# Patient Record
Sex: Female | Born: 1935 | Race: Black or African American | Hispanic: No | Marital: Single | Smoking: Never smoker
Health system: Southern US, Community
[De-identification: ages and names within clinical notes are randomized; demographics above are authoritative.]

## PROBLEM LIST (undated history)

## (undated) DIAGNOSIS — F0390 Unspecified dementia without behavioral disturbance: Secondary | ICD-10-CM

## (undated) DIAGNOSIS — Z933 Colostomy status: Secondary | ICD-10-CM

## (undated) DIAGNOSIS — I10 Essential (primary) hypertension: Secondary | ICD-10-CM

## (undated) DIAGNOSIS — J45909 Unspecified asthma, uncomplicated: Secondary | ICD-10-CM

## (undated) DIAGNOSIS — E119 Type 2 diabetes mellitus without complications: Secondary | ICD-10-CM

## (undated) DIAGNOSIS — J189 Pneumonia, unspecified organism: Secondary | ICD-10-CM

## (undated) DIAGNOSIS — N183 Chronic kidney disease, stage 3 (moderate): Secondary | ICD-10-CM

## (undated) DIAGNOSIS — R001 Bradycardia, unspecified: Secondary | ICD-10-CM

## (undated) DIAGNOSIS — E1140 Type 2 diabetes mellitus with diabetic neuropathy, unspecified: Secondary | ICD-10-CM

## (undated) HISTORY — PX: COLOSTOMY: SHX63

## (undated) HISTORY — DX: Type 2 diabetes mellitus with diabetic neuropathy, unspecified: E11.40

## (undated) HISTORY — DX: Bradycardia, unspecified: R00.1

## (undated) HISTORY — PX: ABDOMINAL HYSTERECTOMY: SHX81

---

## 1997-08-31 ENCOUNTER — Ambulatory Visit (HOSPITAL_COMMUNITY): Admission: RE | Admit: 1997-08-31 | Discharge: 1997-09-01 | Payer: Self-pay | Admitting: Ophthalmology

## 1997-09-10 ENCOUNTER — Inpatient Hospital Stay (HOSPITAL_COMMUNITY): Admission: EM | Admit: 1997-09-10 | Discharge: 1997-09-23 | Payer: Self-pay | Admitting: Emergency Medicine

## 2000-01-14 ENCOUNTER — Encounter: Admission: RE | Admit: 2000-01-14 | Discharge: 2000-01-14 | Payer: Self-pay | Admitting: Emergency Medicine

## 2000-01-14 ENCOUNTER — Encounter: Payer: Self-pay | Admitting: Emergency Medicine

## 2001-03-15 ENCOUNTER — Encounter: Admission: RE | Admit: 2001-03-15 | Discharge: 2001-03-15 | Payer: Self-pay | Admitting: Emergency Medicine

## 2001-03-15 ENCOUNTER — Encounter: Payer: Self-pay | Admitting: Emergency Medicine

## 2002-02-27 ENCOUNTER — Encounter: Admission: RE | Admit: 2002-02-27 | Discharge: 2002-02-27 | Payer: Self-pay | Admitting: Otolaryngology

## 2002-02-27 ENCOUNTER — Encounter: Payer: Self-pay | Admitting: Otolaryngology

## 2002-03-14 ENCOUNTER — Encounter: Admission: RE | Admit: 2002-03-14 | Discharge: 2002-03-14 | Payer: Self-pay | Admitting: Emergency Medicine

## 2002-03-14 ENCOUNTER — Encounter: Payer: Self-pay | Admitting: Emergency Medicine

## 2002-04-12 ENCOUNTER — Encounter: Payer: Self-pay | Admitting: Otolaryngology

## 2002-04-12 ENCOUNTER — Encounter: Admission: RE | Admit: 2002-04-12 | Discharge: 2002-04-12 | Payer: Self-pay | Admitting: Otolaryngology

## 2002-04-26 ENCOUNTER — Encounter: Payer: Self-pay | Admitting: Otolaryngology

## 2002-04-26 ENCOUNTER — Encounter: Admission: RE | Admit: 2002-04-26 | Discharge: 2002-04-26 | Payer: Self-pay | Admitting: Otolaryngology

## 2002-04-28 ENCOUNTER — Encounter (INDEPENDENT_AMBULATORY_CARE_PROVIDER_SITE_OTHER): Payer: Self-pay | Admitting: *Deleted

## 2002-04-28 ENCOUNTER — Ambulatory Visit (HOSPITAL_BASED_OUTPATIENT_CLINIC_OR_DEPARTMENT_OTHER): Admission: RE | Admit: 2002-04-28 | Discharge: 2002-04-29 | Payer: Self-pay | Admitting: Otolaryngology

## 2002-04-28 HISTORY — PX: NASAL SINUS SURGERY: SHX719

## 2004-06-05 ENCOUNTER — Emergency Department (HOSPITAL_COMMUNITY): Admission: EM | Admit: 2004-06-05 | Discharge: 2004-06-05 | Payer: Self-pay | Admitting: Family Medicine

## 2004-06-05 ENCOUNTER — Ambulatory Visit (HOSPITAL_COMMUNITY): Admission: RE | Admit: 2004-06-05 | Discharge: 2004-06-05 | Payer: Self-pay | Admitting: Family Medicine

## 2004-06-24 ENCOUNTER — Ambulatory Visit: Payer: Self-pay | Admitting: Internal Medicine

## 2004-10-24 ENCOUNTER — Ambulatory Visit: Payer: Self-pay | Admitting: Internal Medicine

## 2005-03-04 ENCOUNTER — Ambulatory Visit: Payer: Self-pay | Admitting: Internal Medicine

## 2005-03-14 ENCOUNTER — Inpatient Hospital Stay (HOSPITAL_COMMUNITY): Admission: EM | Admit: 2005-03-14 | Discharge: 2005-03-20 | Payer: Self-pay | Admitting: Family Medicine

## 2005-07-29 ENCOUNTER — Encounter: Admission: RE | Admit: 2005-07-29 | Discharge: 2005-07-29 | Payer: Self-pay | Admitting: Emergency Medicine

## 2006-06-03 ENCOUNTER — Ambulatory Visit: Payer: Self-pay | Admitting: Internal Medicine

## 2006-11-07 ENCOUNTER — Emergency Department (HOSPITAL_COMMUNITY): Admission: EM | Admit: 2006-11-07 | Discharge: 2006-11-07 | Payer: Self-pay | Admitting: Emergency Medicine

## 2006-11-09 ENCOUNTER — Encounter: Admission: RE | Admit: 2006-11-09 | Discharge: 2006-11-09 | Payer: Self-pay | Admitting: Emergency Medicine

## 2006-11-15 HISTORY — PX: LUMBAR FUSION: SHX111

## 2006-11-16 ENCOUNTER — Encounter: Admission: RE | Admit: 2006-11-16 | Discharge: 2006-11-16 | Payer: Self-pay | Admitting: Emergency Medicine

## 2006-11-30 ENCOUNTER — Inpatient Hospital Stay (HOSPITAL_COMMUNITY): Admission: EM | Admit: 2006-11-30 | Discharge: 2006-12-14 | Payer: Self-pay | Admitting: Emergency Medicine

## 2006-12-03 ENCOUNTER — Ambulatory Visit: Payer: Self-pay | Admitting: Infectious Diseases

## 2006-12-08 ENCOUNTER — Encounter (INDEPENDENT_AMBULATORY_CARE_PROVIDER_SITE_OTHER): Payer: Self-pay | Admitting: Neurosurgery

## 2006-12-08 ENCOUNTER — Ambulatory Visit: Payer: Self-pay | Admitting: *Deleted

## 2006-12-10 ENCOUNTER — Encounter: Payer: Self-pay | Admitting: Neurosurgery

## 2006-12-10 ENCOUNTER — Ambulatory Visit: Payer: Self-pay | Admitting: Physical Medicine & Rehabilitation

## 2006-12-14 ENCOUNTER — Inpatient Hospital Stay (HOSPITAL_COMMUNITY)
Admission: RE | Admit: 2006-12-14 | Discharge: 2006-12-29 | Payer: Self-pay | Admitting: Physical Medicine & Rehabilitation

## 2006-12-14 ENCOUNTER — Ambulatory Visit: Payer: Self-pay | Admitting: Physical Medicine & Rehabilitation

## 2007-01-07 ENCOUNTER — Encounter: Payer: Self-pay | Admitting: Internal Medicine

## 2007-01-21 ENCOUNTER — Encounter (INDEPENDENT_AMBULATORY_CARE_PROVIDER_SITE_OTHER): Payer: Self-pay | Admitting: Internal Medicine

## 2007-01-21 ENCOUNTER — Ambulatory Visit: Payer: Self-pay | Admitting: Vascular Surgery

## 2007-01-21 ENCOUNTER — Inpatient Hospital Stay (HOSPITAL_COMMUNITY): Admission: EM | Admit: 2007-01-21 | Discharge: 2007-01-27 | Payer: Self-pay | Admitting: Emergency Medicine

## 2007-02-28 ENCOUNTER — Ambulatory Visit: Payer: Self-pay | Admitting: Infectious Disease

## 2007-02-28 DIAGNOSIS — M869 Osteomyelitis, unspecified: Secondary | ICD-10-CM | POA: Insufficient documentation

## 2007-02-28 DIAGNOSIS — R6883 Chills (without fever): Secondary | ICD-10-CM

## 2007-02-28 DIAGNOSIS — M519 Unspecified thoracic, thoracolumbar and lumbosacral intervertebral disc disorder: Secondary | ICD-10-CM

## 2007-02-28 DIAGNOSIS — D508 Other iron deficiency anemias: Secondary | ICD-10-CM | POA: Insufficient documentation

## 2007-02-28 LAB — CONVERTED CEMR LAB
BUN: 25 mg/dL — ABNORMAL HIGH (ref 6–23)
Basophils Absolute: 0.1 10*3/uL (ref 0.0–0.1)
CO2: 29 meq/L (ref 19–32)
Calcium: 9.9 mg/dL (ref 8.4–10.5)
Chloride: 104 meq/L (ref 96–112)
Creatinine, Ser: 1.23 mg/dL — ABNORMAL HIGH (ref 0.40–1.20)
Eosinophils Absolute: 0.1 10*3/uL — ABNORMAL LOW (ref 0.2–0.7)
Eosinophils Relative: 1 % (ref 0–5)
HCT: 32.4 % — ABNORMAL LOW (ref 36.0–46.0)
Hemoglobin: 10.6 g/dL — ABNORMAL LOW (ref 12.0–15.0)
Lymphocytes Relative: 34 % (ref 12–46)
Lymphs Abs: 2.3 10*3/uL (ref 0.7–4.0)
MCV: 86.4 fL (ref 78.0–100.0)
Monocytes Absolute: 0.5 10*3/uL (ref 0.1–1.0)
RDW: 18.5 % — ABNORMAL HIGH (ref 11.5–15.5)
TSH: 2.058 microintl units/mL (ref 0.350–5.50)

## 2007-12-09 ENCOUNTER — Encounter: Admission: RE | Admit: 2007-12-09 | Discharge: 2007-12-09 | Payer: Self-pay | Admitting: Emergency Medicine

## 2007-12-14 ENCOUNTER — Encounter: Admission: RE | Admit: 2007-12-14 | Discharge: 2007-12-14 | Payer: Self-pay | Admitting: Emergency Medicine

## 2008-03-27 IMAGING — CR DG CHEST 1V PORT
1 series · 1 of 1 positions shown · non-contrast
Comparison: 12/20/06.

CLINICAL DATA: The patient is recovering after lumbar surgery and requires PICC line.  A left upper extremity PICC line has been placed at the bedside and is new from a previous catheter placed on 12/20/06. 
 PORTABLE CHEST ([DATE] HOURS):

[view not recorded]
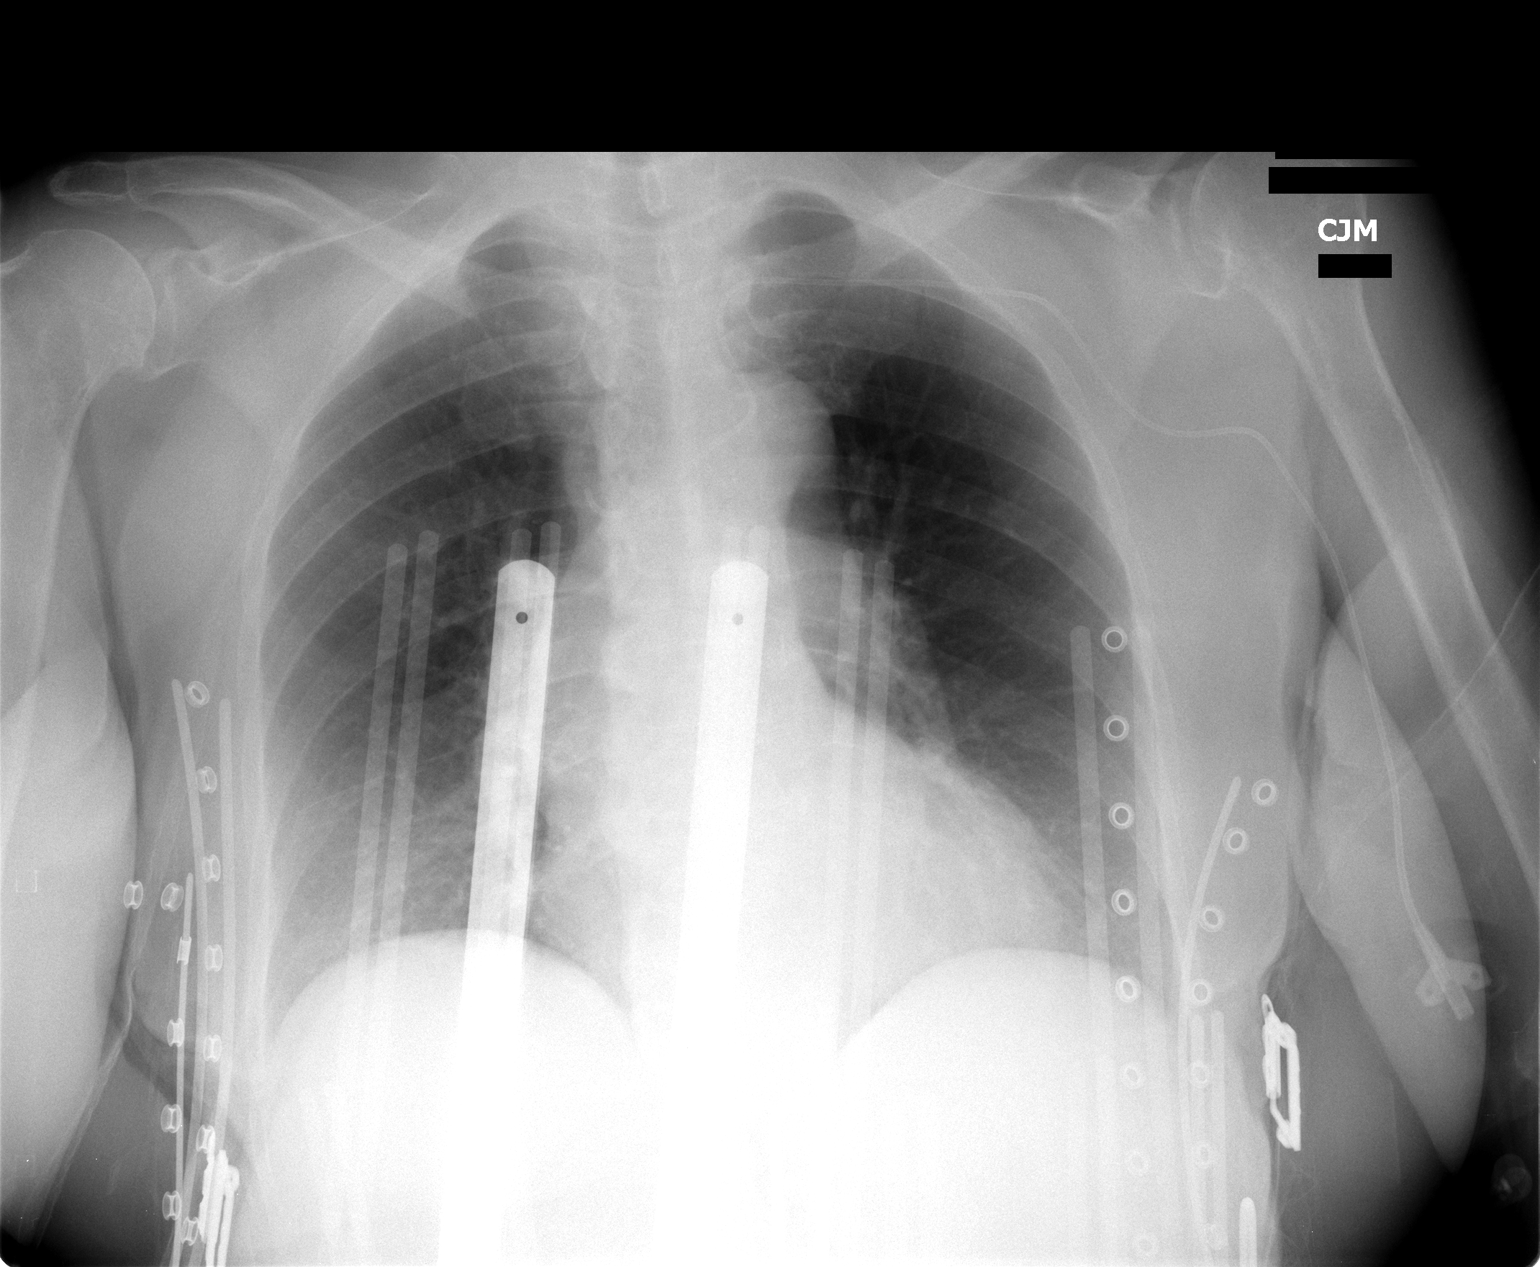

[1 of 1 positions shown; findings below may reference images not displayed]

FINDINGS: Left upper extremity PICC line is visualized.  The catheter tip overlies the proximal SVC and is in a curved orientation with the tip projecting somewhat superiorly relative to where it enters the SVC.  In this configuration, this does suggest that the tip lies within or at the orifice of the azygos vein.  Lungs are otherwise clear.
IMPRESSION: Newly placed PICC line tip lies in the upper SVC.  Based on configuration it is suspected that the tip lies in or at the orifice of the azygos vein.

## 2008-10-07 ENCOUNTER — Emergency Department (HOSPITAL_COMMUNITY): Admission: EM | Admit: 2008-10-07 | Discharge: 2008-10-07 | Payer: Self-pay | Admitting: Family Medicine

## 2010-04-08 ENCOUNTER — Encounter
Admission: RE | Admit: 2010-04-08 | Discharge: 2010-04-08 | Payer: Self-pay | Source: Home / Self Care | Attending: Internal Medicine | Admitting: Internal Medicine

## 2010-06-22 LAB — DIFFERENTIAL
Basophils Relative: 0 % (ref 0–1)
Lymphocytes Relative: 26 % (ref 12–46)
Lymphs Abs: 1.8 10*3/uL (ref 0.7–4.0)
Monocytes Relative: 1 % — ABNORMAL LOW (ref 3–12)
Neutro Abs: 5.1 10*3/uL (ref 1.7–7.7)
Neutrophils Relative %: 71 % (ref 43–77)

## 2010-06-22 LAB — CBC
RBC: 4.35 MIL/uL (ref 3.87–5.11)
WBC: 7.1 10*3/uL (ref 4.0–10.5)

## 2010-07-29 NOTE — Discharge Summary (Signed)
NAMEANTARA, [name] NO.:  [id number]   MEDICAL RECORD NO.:  [id number]          PATIENT TYPE:  INP   LOCATION:  45                         FACILITY:  [facility]   PHYSICIAN:  Edythe Lynn, M.D.       DATE OF BIRTH:  07/04/1932   DATE OF ADMISSION:  11/30/2006  DATE OF DISCHARGE:                               DISCHARGE SUMMARY   PRIMARY CARE PHYSICIAN:  Dr. Harden Mo.   DISCHARGE DIAGNOSES:  1. Possible L4-L5 diskitis with spread of the infection to the      iliopsoas muscle.  2. Significant bilateral hip osteoarthritis.  3. Severe spinal stenosis at L3-L4 as well as L4-L5 - planned      neurosurgery for December 09, 2006.  4. Hypertension controlled.  5. Diabetes mellitus type 2 controlled.  6. Status post colectomy with colostomy.  7. Asthma and allergic rhinitis.   DISCHARGE MEDICATIONS:  Disposition will be dictated at the time of the  actual discharge of the patient.   PROCEDURES DURING THIS ADMISSION:  1. November 30, 2006 the patient underwent a chest x-ray PA and      lateral; no acute cardiopulmonary findings.  2. December 01, 2006 MRI of the pelvis with findings of possible L4-      L5 diskitis, bilateral hip joint degenerative changes without      necrosis.  3. MRI of the lumbar spine December 01, 2006.  Findings of severe      spinal stenosis L3-L4, enhancement of the end plates of 075-GRM,      possible diskitis.   CONSULTATION:  During this admission Dr. Newman Pies from  neurosurgery and Dr. Bobby Rumpf from infectious diseases.   History and Physical, refer to the dictated H&P done by Dr. Jana Hakim on November 30, 2006.   HOSPITAL COURSE:  1. Severe pain in the lower back with the radiation more into the left      lower extremity.  [name] was admitted on November 30, 2006      with worsening of her back pain and inability to walk.  The patient      had a repeat MRI of her lumbar spine with confirmation of  the      previously observed severe spinal stenosis.  The MRI of the lumbar      spine raised the possibility of diskitis, and indeed upon admission      it was noted that the patient was mildly febrile and with a      slightly elevated white blood cell count.  The patient was started      empirically on vancomycin and Rocephin, and an infectious disease      consultation was obtained.  It was noted that the patient's      sedimentation rate was at 119.  On antibiotics the patient's pain      has improved, and her white blood cell count has normalized.  Dr.      Arnoldo Morale from neurosurgery has evaluated the case, and he is going      to attempt  surgical intervention for the spinal stenosis and      possible aspiration of the suspected L4-L5 disk space.  2. Diabetes mellitus.  This has been under very good control.  The      patient has been kept on Lantus 5 units at bedtime and on sliding      scale insulin as needed.  3. Hypertension.  This has been very well-controlled on the patient's      chronic medications.      Edythe Lynn, M.D.  Electronically Signed     SL/MEDQ  D:  12/07/2006  T:  12/08/2006  Job:  [id number]   cc:   Harden Mo, M.D.

## 2010-07-29 NOTE — Discharge Summary (Signed)
[name], [name] NO.:  [id number]   MEDICAL RECORD NO.:  [id number]          PATIENT TYPE:  IPS   LOCATION:  4028                         FACILITY:  [facility]   PHYSICIAN:  Jarvis Morgan, M.D.   DATE OF BIRTH:  07/04/1932   DATE OF ADMISSION:  12/14/2006  DATE OF DISCHARGE:  12/29/2006                               DISCHARGE SUMMARY   DISCHARGE DIAGNOSES:  1. Lumbar diskitis, stenosis status post lumbar L3-4, 4-5      transforaminal lumbar interbody fusion with decompression December 09, 2006.  2. Intravenous vancomycin/rifampin.  3. Subcutaneous Lovenox for deep vein thrombosis prophylaxis.  4. Diabetes mellitus.  5. Asthma.  6. Anemia.   This is a 77 year old female admitted September 16 with left hip pain  and numbness of the lower extremities.  Recent MRI of the spine showed  spondylolisthesis, central cord stenosis, probable diskitis lumbar L4-5.  Placed on empiric vancomycin and Rocephin per infectious disease.  Sedimentation rate 119.  Neurosurgery, Newman Pies.  Underwent  lumbar L3-4, 4-5 TLIF September 25 decompressive laminectomy.  Fitted  with a lumbar sacral corset.  Venous Doppler studies lower extremities  negative.   Infectious disease followed up for fever of 100.1 to 101.4, questionable  drug fever.  Her ceftriaxone discontinued September 29.  Vancomycin was  held September 30 secondary to a vancomycin level of 32.5.  Blood  cultures September 26 were negative.   PAST MEDICAL HISTORY:  1. Non-insulin dependent diabetes mellitus.  2. Hypertension.  3. Asthma.  4. 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   No alcohol or tobacco.   ALLERGIES:  Aspirin.   SOCIAL HISTORY:  Retired, lives with her granddaughter, 1-level home, 3  steps to enter.   MEDICATIONS PRIOR TO ADMISSION:  1. Hydrochlorothiazide 25 mg daily.  2. Tramadol as needed.  3. Lisinopril 5 mg daily.  4. Glucophage 500 mg twice daily.  5. Prednisone 10  mg daily.  6. Bumex 1 mg daily.  7. Potassium chloride 20 mEq daily.  8. Neurontin 300 mg twice daily.  9. Albuterol inhaler as needed.   REHABILITATION HOSPITAL COURSE:  The patient was admitted to inpatient  rehab services with therapies initiated on a 3-hour daily basis  consisting of physical therapy, occupational therapy and rehabilitation  nursing.  The following issues were addressed during the patient's  rehabilitation stay.  Pertaining to [name]'s lumbar diskitis  stenosis, she had undergone lumbar L3-4, 4-5 TLIF decompression  September 25, surgical site healing nicely.  She had a lumbar sacral  corset on when out of bed.  She would follow up with neurosurgery, Dr.  Newman Pies, [phone], as advised.  She is at goal level of  supervision with her mobility.  Strength and endurance continued to  improve.  She is minimal assistance for stairs.  She remained on  intravenous vancomycin and rifampin through January 28, 2007 per  infectious disease, Dr. Michel Bickers, [phone].  She had some low grade  fevers that were monitored.  Latest chest x-ray showed no active  disease.  It was felt  by infectious disease she would run some low grade  temperature that needed to be monitored.  Pain management ongoing with  the use of Ultram as needed.  She had initially been placed on  Neurontin, this had since been discontinued.  She remained on  subcutaneous Lovenox throughout her rehab course for deep vein  thrombosis prophylaxis.  History of non-insulin dependent diabetes  mellitus with blood sugars 130, 110, 134.  She remained on diabetic diet  as well as Glucophage 500 mg twice daily.  Blood pressures with  diastolics 0000000.  She remained on Bumex 1  mg daily as well as  hydrochlorothiazide 25 mg daily.  Close monitoring of potassium levels,  with latest potassium of latest potassium of 3.7.  She did have a  history of colostomy for diverticular rupture with routine care as   needed.  Due to ongoing slow gain of functional mobility, it was at the  family's request that skilled nursing facility would be needed with bed  becoming available on October 15 and discharge taking place at that  time.   Latest labs showed a sodium 137, potassium 3.7, BUN 22, creatinine 1.42,  latest hemoglobin 9.5, hematocrit 28.7, platelet 534,000.   DISCHARGE MEDICATIONS AT THE TIME OF DISCHARGE INCLUDED:  1. Ferrous sulfate 325 mg p.o. daily.  2. Senokot tablets 2 tablets p.o. q.h.s., hold for loose stools.  3. Protonix 40 mg p.o. daily.  4. Bumex 1 mg p.o. daily.  5. Hydrochlorothiazide 25 mg p.o. daily.  6. Rifampin 600 mg p.o. daily.  7. Vancomycin 1000 mg intravenous every 48 hours to be discontinued      January 28, 2007.  8. Multivitamin 1 tablet p.o. daily.  9. Glucophage 500 mg p.o. b.i.d.  10.Ultram 25 mg p.o. 4 times daily as needed pain.  11.Potassium chloride 20 mEq p.o. daily.  12.Tylenol 650 mg p.o. every 4 hours as needed pain or fever.   DIET:  Diabetic diet.   SPECIAL INSTRUCTIONS:  Lumbar sacral corset on when out of bed or when  sitting up.  The patient should continue on intravenous vancomycin and  rifampin until January 28, 2007 and stop.  She should follow up with  Dr. Michel Bickers of infectious disease at [facility], [phone].  Appointment should be made regarding this.  She would be contact  isolation for her diskitis.      Lauraine Rinne, P.A.    ______________________________  Jarvis Morgan, M.D.    DA/MEDQ  D:  12/28/2006  T:  12/29/2006  Job:  [id number]   cc:   Jarvis Morgan, M.D.  Michel Bickers, M.D.  Ophelia Charter, M.D.  Harden Mo, M.D.

## 2010-07-29 NOTE — Discharge Summary (Signed)
NAMETEEYA, WYGLE NO.:  [id number]   MEDICAL RECORD NO.:  [id number]          PATIENT TYPE:  IPS   LOCATION:  4028                         FACILITY:  [facility]   PHYSICIAN:  Durwin Nora, MDDATE OF BIRTH:  07/04/1932   DATE OF ADMISSION:  12/14/2006  DATE OF DISCHARGE:                               DISCHARGE SUMMARY   ADDENDUM   DISCHARGE DIAGNOSES:  Remains the same:  1. Lumbar stenosis.  2. Diabetes mellitus; mild gastroparesis.  3. Hypertension.  4. Diskitis status post surgery.  5. Status post cholecystectomy.   DISCHARGE CONDITION:  Stable.   DISPOSITION:  To the skilled nursing facility rehab.   CONSULTS:  1. Neurosurgery.  2. Radiology.  3. Also of note, Dr. Newman Pies from neurosurgery consulted and      Dr. Bobby Rumpf from infectious disease consulted.   RADIOLOGY:  The chest x-ray of December 10, 2006 shows cardiomegaly  without edema, small bilateral pleural effusion.   MRI lumbar spine of December 01, 2006 shows severe spinal stenosis at  L3-L4, mild stenosis at L5-S1, disc protrusion at L4-5, some edema  tracking along the surface of the psoas and iliacus muscle suggesting  diskitis.   MRI of pelvis shows significant bilateral hip degenerative joint disease  and L2-4-5 diskitis.   PHYSICAL EXAMINATION:  GENERAL:  She is an elderly lady not in acute  respiratory distress.  VITAL SIGNS:  Temperature is 98, pulse 77, respiratory rate 16, blood  pressure 150/80.  HEENT:  Her pupils are equal, reactive to light and accommodation.  CARDIOVASCULAR:  Heart sounds 1 and 2 regular.  CHEST:  Clear.  ABDOMEN:  Soft, nontender.  Bowel sounds normoactive.  NEUROLOGICAL:  She is alert and oriented x3.  Peripheral pulses present.  No edema.  Sitting out of bed to chair.   MEDICATIONS ON DISCHARGE:  1. Neurontin 300 mg b.i.d.  2. Tramadol 50 mg q.6 hours p.r.n.  3. Ferrous sulfate 325 mg daily.  4. Senokot one tablet  q.h.s.  5. Sliding-scale insulin, sensitive scale; Accu-Cheks a.c., h.s.  6. Protonix 40 mg daily.  7. Cortisone 5 mg b.i.d.  8. Rifampin 600 mg daily.  9. Lantus insulin 50 units q.h.s.  10.Cefixime 400 mg IV q.12 hours for one week to be evaluated by the      ER doctor.  11.Vancomycin 750 mg IV q.12 hours.  12.Reglan 5 mg p.o. t.i.d.   AVAILABLE LABS:  Chemistries:  Sodium is 138, potassium 3.5, chloride  104, bicarbonate 28, glucose 128, BUN 13, creatinine 1.2.  WBCs 8,  hematocrit 29, platelet count 460.   Patient needs to be on IV antibiotics for at least two weeks and  possibly longer after ID followup, Tylenol 650 mg q.6 hours p.r.n. for  fever.  Repeat blood culture to monitor clearance of infection.  Follow  up the blood culture report sent today as outpatient.      Durwin Nora, MD  Electronically Signed     MIO/MEDQ  D:  12/14/2006  T:  12/14/2006  Job:  [id number]

## 2010-07-29 NOTE — Discharge Summary (Signed)
NAMEJENTRY, Sandy                ACCOUNT NO.:  [id number]   MEDICAL RECORD NO.:  [id number]          PATIENT TYPE:  INP   LOCATION:  [id number]                         FACILITY:  [facility]   PHYSICIAN:  Doree Albee, M.D.DATE OF BIRTH:  07/04/1932   DATE OF ADMISSION:  12/03/2006  DATE OF DISCHARGE:                               DISCHARGE SUMMARY   DATE OF DISCHARGE:  To be determined.   DIAGNOSES:  1. Discitis, status post surgery.  2. Lumbar stenosis.  3. Diabetes mellitus.  4. Hypertension, controlled.  5. Status post colectomy due to rupture requiring colostomy.   MEDICATIONS AT PRESENT:  1. Vancomycin intravenously per pharmacy protocol.  2. Neurontin 300 mg b.i.d.  3. Tramadol 50 mg q.i.d.  4. Ferrous sulfate 325 mg every day.  5. Sinecot 1 tablet q.h.s.  6. Sliding scale insulin.  7. Protonix 40 mg every day.  8. Colace 100 mg b.i.d.  9. Rifampin 600 mg every day.  10.Lantus insulin 15 units q.h.s.  11.Ciprofloxacin 400 mg IV q. 12 hours.  12.Vancomycin 750 mg q. 12 hours IV.   HISTORY:  This 77 year old lady was admitted with difficulty walking and  a painful left hip associated with a fever.  Please see initial physical  examination done by Dr. Jana Hakim.   HOSPITAL PROGRESS:  The patient was admitted and blood cultures were  taken for source of fever, and she was put on empirical antibiotics  intravenously.  She underwent a MRI of the lumbar spine which showed  severe central spinal stenosis at L3-L4, L4-L5, and neurosurgery was  consulted.  They felt she had a discitis and infectious disease agreed  with this.  She underwent surgery for this on December 09, 2006, and  apart from postoperative fever, she has done reasonably well.  Her  fevers have now subsided and she has not been feverish for the last 24  hours at least.  Infectious disease has been involved in her care and  adjusting her intravenous antibiotics.  She is now doing well and will  probably  do well with rehabilitation and admission to rehab unit based  on the recommendations by physical and occupational therapy.   PHYSICAL EXAMINATION TODAY:  VITAL SIGNS:  Temperature 98.1, blood  pressure 152/78, pulse 86, saturations 97% on two liters of oxygen.  HEART:  Sounds are present and normal.  LUNGS:  Fields are clear.   Her CBGs are less than 200.  Sodium 138, potassium 3.5, bicarbonate 28,  BUN 13, creatinine 1.29.  Hemoglobin 9.5, white blood cell count 8.7,  platelets 460,000.   FURTHER DISPOSITION:  Hopefully she will do well in rehab before going  home.  Infectious disease needs to give an outline of how long she  should be on antibiotics for.      Doree Albee, M.D.  Electronically Signed     NCG/MEDQ  D:  12/13/2006  T:  12/13/2006  Job:  [id number]

## 2010-07-29 NOTE — Discharge Summary (Signed)
[name], [name] NO.:  [id number]   MEDICAL RECORD NO.:  [id number]          PATIENT TYPE:  IPS   LOCATION:  4028                         FACILITY:  [facility]   PHYSICIAN:  Jarvis Morgan, M.D.   DATE OF BIRTH:  07/04/1932   DATE OF ADMISSION:  12/14/2006  DATE OF DISCHARGE:  12/29/2006                               DISCHARGE SUMMARY   DISCHARGE ADDENDUM   Initially, the family had planned for skilled nursing facility.  However, as her mobility improved they felt that they could attend the  patient at home with a home health nurse provided.  Thus, discharge did  change from skilled nursing facility to home discharge on October 15.  She was discharged in stable condition.  A home health nurse had been  arranged for home intravenous vancomycin per infectious disease that  would continue until January 28, 2007, and all arrangements had been  made.  No other changes were made on her medications at this time.      Lauraine Rinne, P.A.    ______________________________  Jarvis Morgan, M.D.    DA/MEDQ  D:  12/29/2006  T:  12/30/2006  Job:  [id number]

## 2010-07-29 NOTE — Discharge Summary (Signed)
[name], [name]                ACCOUNT NO.:  [id number]   MEDICAL RECORD NO.:  [id number]          PATIENT TYPE:  INP   LOCATION:  [id number]                         FACILITY:  [facility]   PHYSICIAN:  Hind I Elsaid, MD      DATE OF BIRTH:  07/04/1932   DATE OF ADMISSION:  01/20/2007  DATE OF DISCHARGE:                               DISCHARGE SUMMARY   HISTORY:  This is an interim discharge summary.  Date of discharge  pending.   PRIMARY CARE PHYSICIAN:  Dr. Jake Michaelis.   DISCHARGE DIAGNOSES:  1. Left knee pseudogout.  2. Anemia.  3. History of diskitis on vancomycin and rifampicin.  4. Severe lumbar stenosis.  5. Bilateral hip osteoarthritis/chondral calcinosis.  6. Status post colostomy.  7. 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  8. Hypertension.  9. Asthma.  10.Status post lumbar decompression laminectomy in September 2008.  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   MEDICATIONS:  To be addressed at the date of discharge.   PROCEDURE:  1. Left knee arthrocentesis.  2. Chest x-ray:  No evidence of acute cardiopulmonary disease.  3. Left knee x-ray:  Large suprapatellar joint effusion, chondral      calcinosis, lower extremity subcutaneous edema, no evidence of      fracture.  4. MRI of the lumbosacral spine:  There is no imaging finding to      specifically allow diagnosis of infection.  The patient does have a      small fluid collection along the surgical approach.  This is      commonly seen following surgery of this sort.  There is no epidural      collection or convincing evidence of osteomyelitis.  Fusion and      decompression at L3-4 and L4-L5, persistent lateral  and fulminant      stenosis at L5-S1 because of broad-based  herniation of disk      material in combination with facet and ligament hypertrophy.  5. Venous Doppler of the lower extremities which was negative.   HISTORY OF PRESENT ILLNESS:  A 77 year old African-American female who  was recently discharged from [facility] after developing fever  and confusion.  She ended up with a diagnosis with L4 and L5 diskitis  with spread of infection to inguinal psoas muscle.  She also had several  spinal stenoses involving L3 and L4.  She underwent decompression  laminectomy on December 09, 2006 and aspiration of the diskitis during  the procedure.  The patient was started empirically on vancomycin and  was discharged home after spending time at the rehab clinic.  She was  discharged home on IV vancomycin and p.o. rifampin through a PICC line.  She was in her usual state of health until about one week ago when she  developed left lower extremity pain, particularly over her left knee.  She needed assistance to ambulate.  This pain has been getting worse.  She developed fever yesterday and she was brought to the emergency room  for further evaluation.  She denies any chest pain, nausea and vomiting.  The patient denies any back pain.  According to her, the back pain is  improving.  The patient had initial evaluation in the emergency room  with a temperature of low-grade fever, 100.1, and white blood cell count  was normal, hemoglobin was 7.8 and hematocrit of 23.5, ESR noted to be  greater than 140.  The patient was admitted for evaluation.   HOSPITAL COURSE:  Problem #1 - Fever and left knee swelling:  Differential diagnosis was gout versus septic arthritis versus  pseudogout.  The patient was evaluated at that time, which did not look  any evidence of infection or inflammation around it.  The patient had  septic workup including chest x-ray which was negative, blood culture  times two which were negative.  They did urinalysis times one which was  negative, although the patient has a history of VRE in the urine.  The  patient was left on vancomycin and rifampicin, the same dose as she was  taking  at home.  Chest x-ray of the left knee showed possibility of a  suprapatellar effusion.  For that, fluoroscopy-guided aspiration was  done for evaluation of white blood cells and crystals and cultures and  gram stain.  White blood cells are on the fluid were 6450 with a  neutrophil of 82 and it shows cellular calcium and the crystals also  showed intracellular calcium pyrophosphate crystals and extracellular  calcium pyrophosphate crystals.  Gram stain and culture were completely  negative.  Diagnosis made of left knee pseudogout.  The patient was  started on indomethacin 50 mg p.o. t.i.d. in addition to Tylenol and  Solu-Medrol IV.  The patient at the beginning was unable to mobilize her  left knee, even the joint was even tender to palpation.  During  hospitalization, the patient had good movement and pain completely  resolved and as the patient has a history of diskitis, MRI of the  lumbosacral spine results as above did not show any evidence of abscess.  During hospitalization, temperature remained T-max of 99.9.  We did not  notice any increase on white blood cells or obvious fever.  The patient  is clinically improving.  Physical therapy and OT evaluated the patient  at the beginning and at the end, the patient is doing much better and  they recommended home health PT follow-up.  Problem #2 - Anemia:  Hemoglobin on the date of admission was 7.8,  status post two units of blood transfusion.  Anemia panel does ,evidence  of anemia of chronic disease versus anemia of iron deficiency as the  patient has ferritin of 364 and iron of less than 10 and low total iron  binding capacity.  Also the patient has stool guaiac times three which  are completely negative, although the stool was reddish in nature which  we felt was secondary to the rifampicin.  Hemoglobin remained stable  around 9.1 after the two units of blood transfusion with no evidence of  bleeding.  The patient may need to follow  her hemoglobin as an  outpatient very closely.  Problem #3 - History of diskitis:  The patient remained on vancomycin  and rifampicin.  According to the previous discharge summary, the  patient has to continue on the above treatment until November 14th.  The  PICC line remained clean with no evidence of infections and as I  mentioned, blood culture is negative.  Problem #4 - Diabetes mellitus:  Hemoglobin remained stable.  Diabetes  mellitus:  CBG remained stable.  We anticipate discharging the patient  in one to two days if she continues to show signs of improvement.      Hind Franco Collet, MD  Electronically Signed     HIE/MEDQ  D:  01/25/2007  T:  01/25/2007  Job:  [id number]

## 2010-07-29 NOTE — Consult Note (Signed)
[name], Anderson NO.:  [id number]   MEDICAL RECORD NO.:  [id number]          PATIENT TYPE:  INP   LOCATION:  [id number]                         FACILITY:  [facility]   PHYSICIAN:  Ophelia Charter, M.D.DATE OF BIRTH:  07/04/1932   DATE OF CONSULTATION:  12/02/2006  DATE OF DISCHARGE:                                 CONSULTATION   CHIEF COMPLAINT:  Fever, confusions, and trouble walking.   HISTORY OF PRESENT ILLNESS:  The patient is a 77 year old black female  who was brought to the emergency department by her granddaughter on  November 30, 2006, secondary to some confusion and a questionable  history of fevers.  The patient had been evaluated as an outpatient at  urgent care and was worked up with a lumbar MRI, which demonstrated  severe stenosis.  The patient was referred to me and in fact, I had an  appointment to see her yesterday in the office.  The patient missed her  appointment when she was admitted to the hospital.   Presently, the patient tells me that she has had around 3-week history  of severe leg pain.  She tells me that it worsens significantly on  standing and makes it difficult for her to ambulate.  Her legs bother  her equally.  She also has noticed numbness, tingling, and weakness in  her legs.  The patient denies significant back pain.  Her leg pain is  limiting her activities.   PAST MEDICAL HISTORY:  Positive for asthma, type 2 diabetes mellitus,  hypertension, and small bowel obstruction.   PAST SURGICAL HISTORY:  The patient has undergone a proctocolectomy with  colostomy in 2000 secondary to bowel obstruction.   MEDICATIONS:  Prior to admission Glucophage, hydrochlorothiazide,  Ultracet, gabapentin, and lisinopril.   DRUG ALLERGIES:  ASPIRIN.   FAMILY MEDICAL HISTORY:  Positive, the patient's mother died at age 68  with renal failure; the patient's father died around age 56 of unclear  causes.   SOCIAL HISTORY:  The patient is  single.  She has one daughter.  She  lives in [place].  She is retired.  She denies tobacco, ethanol, or  drug use.   REVIEW OF SYSTEMS:  Negative except as above.   PHYSICAL EXAMINATION:  GENERAL:  A pleasant, alert 77 year old black  female with complaints of left hip and leg pain.  VITAL SIGNS:  Temperature is 100.5 degrees Fahrenheit orally,  respiratory rate 16, heart rate 88, and blood pressure 153/73. HEENT:  Normocephalic and atraumatic.  Her pupils are equal, round, and react to  light.  Extraocular muscles are intact.  Oropharynx is benign.  NECK:  Supple.  The patient has a limited cervical range of motion but  Spurling testing is negative.  Lhermitte sign is not present.  There are  no masses, deformities, or tracheal deviation.  THORAX:  Symmetric.  LUNGS:  Clear to auscultation.  HEART:  Regular rhythm.  ABDOMEN:  Soft, nontender.  EXTREMITIES:  No obvious deformities.  BACK:  The patient has severe tenderness to the left FABER testing, more  mild on the right.  Straight leg testing somewhat causes hip pain.  NEUROLOGIC:  The patient is alert and oriented x3.  Cranial nerves II  through XII are grossly normal bilaterally.  Vision and hearing are  grossly normal bilaterally.  Motor strength is 5/5 in her biceps,  triceps, hand grip, deltoid, quadriceps, left psoas, gastrocnemius, and  extensor hallucis longus.  The patient has some weakness, slight  weakness in her right psoas 4+/5.  She has significant weakness in the  right EHL.  Dorsiflexors at 0/5, gastrocnemius strength on the right is  approximately 4/5.  Deep tendon reflexes are trace and symmetric in her  biceps, triceps, and quadriceps; absent in the right gastrocnemius.  There is no ankle clonus.  Sensory exam demonstrates decreased light  touch sensation in her right foot, otherwise unremarkable.  Cerebellar  exam is intact to rapid alternating movements of the upper extremity  bilaterally.   IMAGING  STUDIES:  I reviewed the patient's lumbar MRI performed on  November 16, 2006, at [facility].  The patient has severe  multifactorial spinal stenosis at L3-L4 and L4-L5 with a mild, i.e.,  grade 1 spondylolisthesis at both these levels.  On axial images, the  patient has a far lateral herniated disk at L4-L5 on the left.  T12-L1  and L1-L2 are fairly normal.  L2-L3 have a mild multifactorial spinal  stenosis.  L3-L4 and L4-L5 as above have severe multifactorial spinal  stenosis and a left far lateral herniated disk at L4-L5.  The L5-S1 has  lateral recess stenosis bilaterally.   I also reviewed the patient's MRI performed on December 01, 2006.  The  resolution is poor because of motion artifact, but similar findings were  seen, perhaps some fluid within the disk space.   ASSESSMENT AND PLAN:  An L3-L4 and L4-L5 spondylolisthesis with severe  stenosis.  I have discussed the situation with the patient and her  family including her sisters, daughter, and multiple family members.  I  have told her that I think her problems are largely degenerative in  nature, i.e., severe stenosis at L3-L4 and L4-L5 causing neurogenic  claudication, i.e., her symptoms seem consistent with this.  There is a  possibility of infection, of course, based on the appearance of the MRI;  however, she is not having much back pain, it is mainly leg pain, so I  think this is largely degenerative in nature.  We discussed the various  treatment options including doing nothing, continue medical management,  physical therapy, steroid injections, etc.   I have also discussed the surgical option of L3 and L4 laminectomy to  treat the spinal stenosis with an L3-L4 and L4-L5 posterior lumbar  interbody fusion, placement of an interbody prosthesis, and posterior  segmental instrumentation with pedicle screws and rods, as well as a  posterolateral arthrodesis given her spondylolisthesis and  multifactorial spinal  stenosis.  I described the surgery to them.  I  showed them diagrams in the lumbar fusion pamphlet, which I provided  them.  We discussed the risks of surgery including risk of anesthesia,  hemorrhage, infection, CSF leak, nerve injury, instrumentation  malplacement, malfunction, and failure, medical risk such as pneumonia,  heart attack, etc., failure to relieve the pain, worsening of pain, etc.  We also discussed if this is infection, then we will get cultures and  treat her with appropriate antibiotics in the postoperative period.  I  have answered all their questions.  They are going to think this over  and  let me know their decision.   Hip pain.  She seems to have a lot of pain with motion of her hip.  I  think we need to get a hip MRI as well to make sure we are not missing  something else.      Ophelia Charter, M.D.  Electronically Signed     JDJ/MEDQ  D:  12/02/2006  T:  12/03/2006  Job:  [id number]

## 2010-07-29 NOTE — H&P (Signed)
NAMEMERRITT, KINGMA NO.:  [id number]   MEDICAL RECORD NO.:  [id number]          PATIENT TYPE:  INP   LOCATION:  11                         FACILITY:  [facility]   PHYSICIAN:  Jana Hakim, M.D. DATE OF BIRTH:  07/04/1932   DATE OF ADMISSION:  11/30/2006  DATE OF DISCHARGE:                              HISTORY & PHYSICAL   CHIEF COMPLAINT:  Fever, confusion.   HISTORY OF PRESENT ILLNESS:  This is a 77 year old female brought to the  emergency department by her granddaughter secondary to increased  confusion and reported disorientation today.  The patient was found to  have fevers and reported having chills.  She has also had ongoing  increased pain in the left hip and left knee area.  She reports having  this pain for approximately three weeks and going to an Urgent Care for  an evaluation..  She reports being unable to bear weight on her left leg  secondary to the pain and discomfort.  She denies having any chest pain,  shortness of breath, nausea, vomiting, or diarrhea.   PAST MEDICAL HISTORY:  Asthma, diabetes type 2, and hypertension.   PAST SURGICAL HISTORY:  History of colostomy placement, status post  small-bowel obstruction.  History of proctocolectomy with colostomy in  2000.   MEDICATIONS:  Include Glucophage, hydrochlorothiazide, Ultracet,  gabapentin, and lisinopril.   ALLERGIES:  SHE HAS ALLERGIES TO ASPIRIN THERAPY.   SOCIAL HISTORY:  Nondrinker, nonsmoker, lives at home with her  granddaughter, and is retired.   FAMILY HISTORY:  Noncontributory.   REVIEW OF SYSTEMS:  Pertinent for mentioned above.   PHYSICAL EXAMINATION FINDINGS:  GENERAL:  This is a 77 year old, mildly  overweight, well-developed female in no acute distress.  VITAL SIGNS:  Temperature 100.5, blood pressure 105/69, heart rate 94,  respirations 20, O2 saturation 95% to 99%.  HEENT:  Normocephalic, atraumatic.  Pupils equally round and reactive to  light.  Extraocular  muscles are intact.  Funduscopic benign.  Oropharynx  is clear.  NECK:  Supple full range of motion.  No thyromegaly, adenopathy, or  jugular venous distention.  CARDIOVASCULAR:  Regular rate and rhythm.  No murmurs, gallops, or rubs.  LUNGS:  Clear to auscultation bilaterally.  ABDOMEN:  Positive bowel sounds, soft, nontender, nondistended.  Colostomy present, bag secure, draining brownish liquid stool.  EXTREMITIES:  Without cyanosis, clubbing, or edema.  On examination of  the left lower extremity, there is no tenderness to palpation of the  left hip area.  There are no unusual masses or defects.  The patient  does have limited range of motion of her left knee.  She has prominence  of her fibula on both lower extremities.  There is no cyanosis,  clubbing, or edema the dorsal pedal pulses are 2+ bilaterally.  NEUROLOGIC:  Mildly confused, alert and oriented x3.  Speech is clear.  There are no focal deficits on examination.   LABORATORY STUDIES:  White blood cell count 11.3, hemoglobin 10.2,  hematocrit 30.6, platelets 362, neutrophils 93%, lymphocytes 4%.  Sodium  134, potassium 5.4, chloride 101, carbon dioxide 24, BUN  26, creatinine  1.08, glucose 380.   ASSESSMENT:  1. This is a 77 year old female being admitted with febrile illness.  2. Leukocytosis.  3. Mild dehydration.  4. Left lower extremity and left hip pain.  5. Type-2 diabetes mellitus.  6. Anemia.   PLAN:  The patient will be admitted.  Blood cultures have been sent.  A  urinalysis and CNS have been sent.  IV antibiotic therapy with Unasyn  therapy has been started empirically.  Pain control therapy has also  been ordered.  An MRI of the lumbosacral spine, pelvis, and left hip  have been ordered.  DVT and GI prophylaxis have also been ordered.  Her  metformin therapy will be held for now.  Sliding scale insulin coverage  has been ordered for elevated sugars.      Jana Hakim, M.D.  Electronically  Signed     HJ/MEDQ  D:  12/01/2006  T:  12/02/2006  Job:  [id number]

## 2010-07-29 NOTE — Discharge Summary (Signed)
[name], ROMENESKO NO.:  [id number]   MEDICAL RECORD NO.:  [id number]          PATIENT TYPE:  INP   LOCATION:  6739                         FACILITY:  [facility]   PHYSICIAN:  Sherryl Manges, M.D.  DATE OF BIRTH:  07/04/1932   DATE OF ADMISSION:  01/20/2007  DATE OF DISCHARGE:  01/27/2007                               DISCHARGE SUMMARY   ADDENDUM:   PRIMARY MEDICAL DOCTOR:  Harden Mo, M.D.   DISCHARGE DIAGNOSES:  Refer to interim summary dictated January 25, 2007, by Dr. Donn Pierini.  Also refer to above mentioned interim  summary for procedures, consultations, admission history and detailed  clinical course.   DISCHARGE MEDICATIONS:  1. Glipizide 5 mg p.o. daily.  2. Hydrochlorothiazide 25 mg p.o. daily.  3. Bumetanide 1 mg p.o. daily.  4. Protonix 40 mg p.o. daily.  5. Ferrous sulfate 325 mg p.o. b.i.d. (was on 325 mg p.o. daily).  6. Senokot two pulls p.o. nightly.  7. Multivitamin one p.o. daily.  8. Ultram 25 mg p.o. p.r.n. q.6h.  9. K-Dur 20 mEq p.o. daily.  10.Darvocet N 100 one p.o. q.i.d.  11.Prednisone 40 mg p.o. daily for five days, then 30 mg p.o. daily      for two days, the 20 mg p.o. daily for two days, then 10 mg p.o.      daily for two days, then stop.   NOTE:  Rifampin and Vancomycin have been completed on 01/27/07, after  discussion with Dr Megan Salon.  Glucophage has been discontinued, due to renal insufficiency.   DISPOSITION:  On January 26, 2007, patient was in satisfactory clinical  condition, left knee inflammatory phenomena had significantly subsided  and she had been evaluated by physical therapy/occupational therapy.  Blood pressure was well controlled.  Patient was euglycemic.  There were  no new issues.  Hemoglobin had drifted down to 8.5 and review of iron  studies indicated the patient has iron-deficiency anemia, against a  background of probable anemia of chronic disease.  Certainly, serum iron  levels were  less than 10.  Patient's Ferrous sulfate has therefore been  increased to 325 mg b.i.d.  Patient was deemed sufficiently clinical  stable for discharge.  However, due to logistics reasons, discharge has  been arranged for January 27, 2007, provided no acute problems arise in  the interim.  She has also been transitioned to a tapering course of  oral steroids.   DIET:  Heart healthy/carbohydrate modified.   ACTIVITY:  Per PT/OT.   FOLLOW UP INSTRUCTIONS:  Patient is to follow up with her primary MD,  Dr. Jake Michaelis, routinely and certainly within one to two weeks of  discharge.  Patient's family is to call for an appointment.  She is also  to follow up with  Dr. Megan Salon, infectious disease specialist, in the [facility] per scheduled appointment, telephone number [phone],  i.e., Dr. Michel Bickers. She is recommended home health PT/OT and needed  equipment.      Sherryl Manges, M.D.  Electronically Signed     CO/MEDQ  D:  01/26/2007  T:  01/26/2007  Job:  [id number]   cc:   Harden Mo, M.D.  Michel Bickers, M.D.

## 2010-07-29 NOTE — Op Note (Signed)
[name], [name] NO.:  [id number]   MEDICAL RECORD NO.:  [id number]          PATIENT TYPE:  INP   LOCATION:  [id number]                         FACILITY:  [facility]   PHYSICIAN:  Ophelia Charter, M.D.DATE OF BIRTH:  07/04/1932   DATE OF PROCEDURE:  12/09/2006  DATE OF DISCHARGE:                               OPERATIVE REPORT   BRIEF HISTORY:  The patient is a 77 year old black female who has  suffered from back and leg pain.  It has gotten to the point where she  has been unable to ambulate for the last several weeks.  The patient was  admitted with this and underwent a lumbar MRI, which demonstrated the  patient had severe stenosis, as well spondylolisthesis at L3-4 and L4-5.  There was also some fluid seen within the intervertebral disk at L4-5  raising the question of diskitis/infection.  The patient was treated  empirically with antibiotics for approximately a week.  The patient had  weakness in the right leg.  I discussed the various treatment options  with the patient including surgery.  The patient and her family have  weighed the risks, benefits, and alternatives of surgery and decided to  proceed with a lumbar decompression fusion.   PREOPERATIVE DIAGNOSIS:  L3-4 and L4-5 grade 1 acquired  spondylolisthesis, spinal stenosis, disk degeneration, spondylosis,  lumbar radiculopathy, and lumbago.   POSTOPERATIVE DIAGNOSES:  L3-4 and L4-5 grade 1 acquired  spondylolisthesis, spinal stenosis, disk degeneration, spondylosis,  lumbar radiculopathy, and lumbago.   PROCEDURE:  Decompressive laminectomy at L3 to treat spinal stenosis at  L3-4 and L4 to treat spinal stenosis at L5; L3-4 and L4-5 transforaminal  lumbar interbody fusion; insertion of L3-4 and L4-5 interbody prosthesis  (Capstone PEEK interbody prosthesis); L3-4 and L4-5 posterior segmental  instrumentation with Legacy titanium pedicle screws and rods; L3-4 and  L4-5 posterolateral arthrodesis with  local morselized autograft bone  VITOSS bone-graft extender.   SURGEON:  Ophelia Charter, M.D.   ASSISTANT:  Earleen Newport, M.D.   ANESTHESIA:  General endotracheal.   ESTIMATED BLOOD LOSS:  500 mL.   SPECIMENS:  Cultures.   DRAINS:  One epidural Blake drain.   COMPLICATIONS:  None.   DESCRIPTION OF PROCEDURE:  The patient was brought to the operating room  by the anesthesia team.  General endotracheal anesthesia was induced.  The patient was then carefully turned to the prone position on the  Wilson frame.  The lumbosacral region was then prepared with Betadine  scrub and Betadine solution.  Sterile drapes were applied.  I then  injected the area to be incised with Marcaine with epinephrine solution.  I used a scalpel to make a linear midline incision over the L3-4 and L4-  5 interspaces.  I used electrocautery to perform a bilateral  subperiosteal dissection exposing the spinous process lamina to down to  L5.  We obtained an intraoperative radiograph to confirm our location.  We then inserted the Versa-Trac retractor for exposure.   We began by incising the interspinous ligament at L3-4 and L4-5.  We  used Land  to remove the spinous process of L4 and part of the  L4 lamina.  We then began the decompression by using a high-speed drill  to perform bilateral L3-4 laminotomies.  We then used a Kerrison punch  to remove the L4-5 ligamentum flavum, the cephalad aspect of the L5  lamina, as well as to complete the L4 laminectomy and remove the L3-4  ligamentum flavum and to widen the laminotomies bilaterally at L3.  We  then performed foraminotomies about the bilateral L3, L4, and L5 nerve  roots completing the decompression.  Of note because of this severe  stenosis at L3-4 and L4-5, a much greater decompression was required  than would have been necessary only to perform the transforaminal lumbar  interbody fusion, i.e., we removed the middle aspects of the  bilateral  L3-4 and L4-5 facets and performed generous foraminotomy about the  bilateral L3, L4, L5 nerve roots.   Having completed the decompression at L3-4 and L4-5, we now turned our  attention to the arthrodesis.  We carefully freed up the thecal sac and  neural structures from the epidural tissue.  We then gently retracted  the neural structures medially and exposed the left L3-4 and L4-5  intervertebral disks.  We incised these disks with the #15 blade scalpel  and performed a partial intervertebral diskectomy using the pituitary  forceps and Carlen curettes in preparation for the transforaminal lumbar  interbody fusion.  We cleared the soft tissue from the vertebral  endplates using the curettes.  We then used trial spacers and determined  to use 8 x 26 mm Capstone PEEK interbody prosthesis at both L3-4 and L4-  5.  We prefilled this prosthesis with a combination of local morselized  autograft bone during the decompression, as well as VITOSS bone-graft  extender.  We then inserted the prosthesis into the L3-4, L4-5  interspaces, of course, after retracting the neural structures outside  of harm's way.  We then used bone tamps to turn the prosthesis sideways.  I should mention that we placed autograft bone and VITOSS ventral in the  disk space prior to placing the prosthesis.  After we were satisfied  with the position of prosthesis, we then placed VITOSS and autograft  bone in the disk space posteriorly to the prosthesis, completing the  transforaminal lumbar interbody fusion.   We now turned our attention to the instrumentation.  Under fluoroscopic  guidance we cannulated bilateral L3, L4, and L5 pedicles with the bone  probes.  We then tapped inside the pedicles with a 5.5 mm tap and then  removed the tap and probed inside the tapped pedicles to rule out  cortical breeches and then inserted 6.5 x 45 mm pedicle screws  bilaterally at L3, L4, and L5 under fluoroscopic guidance.   We then  palpated along the medial aspect of the bilateral L3, L4, and L5  pedicles, and noted that there were no cortical breeches and the nerve  roots were not injured.  We then connected the unilateral pedicle screw  to the lordotic rod, which we fastened in place with the caps.  We then  placed a cross connector between the rods and tightened it  appropriately.  This completed the instrumentation.   We now turned our attention to posterolateral arthrodesis.  We used a  high-speed drill to decorticate the remainder of the bilateral L3-4, L4-  5 facets pars region and transverse processes.  We then laid a  combination of local morselized autograft  bone and VITOSS bone-graft  extender for the decorticated posterolateral structures, and this  completed the posterolateral arthrodesis.   We then obtained hemostasis using bipolar cautery, irrigated the wound  out with bacitracin solution and then removed the retractor, and we then  placed a 10-mm Blake drain in the epidural space and tunneled out  through a separate stab wound.   I should mention that we obtained 2 cultures during this operation.  During the exposure, we noticed that the cephalad aspect of the  subcutaneous tissue retained a quite a bit of edema, more so than we had  expected.  We therefore cultured this edema from the subcutaneous tissue  in the upper aspect of the wound.  We obtained a second culture within  the L4-5 intervertebral disk space.  Of note, I did not notice any clear  purulent material during any stage of this operation.   We then reapproximated the patient's thoracolumbar fascia with  interrupted #1 Vicryl suture, subcutaneous tissue with interrupted 2-0  Vicryl suture and skin with Steri-Strips and Benzoin.  The wound was  then coated with bacitracin ointment and sterile dressing applied.  The  drapes were removed.  The patient was subsequently returned to the  supine position and extubated by the  anesthesia team and transported to  post-anesthesia care unit in stable condition.  All sponge, instrument,  and needle counts were correct at the end of this case.      Ophelia Charter, M.D.  Electronically Signed     JDJ/MEDQ  D:  12/09/2006  T:  12/10/2006  Job:  [id number]

## 2010-07-29 NOTE — H&P (Signed)
Sandy, Anderson                ACCOUNT NO.:  [id number]   MEDICAL RECORD NO.:  [id number]          PATIENT TYPE:  EMS   LOCATION:  MAJO                         FACILITY:  [facility]   PHYSICIAN:  [name], M.D.DATE OF BIRTH:  07/04/1932   DATE OF ADMISSION:  01/20/2007  DATE OF DISCHARGE:                              HISTORY & PHYSICAL   PRIMARY CARE PHYSICIAN:  [name].   CHIEF COMPLAINT:  Fever.   HISTORY OF PRESENTING COMPLAINT:  Ms.  Anderson is a 77 year old African  American female who was hospitalized on December 01, 2006 for fever and  confusion.  She ended up being diagnosed with L4 and L5 diskitis, with  spread of infection to the iliopsoas muscle.  She also had severe spinal  stenosis involving L3-4 and L4-5.  She underwent decompression  laminectomy on December 09, 2006, and I believe she had aspiration of  the diskitis during that procedure.  The patient was started on empiric  treatment with vancomycin.  She was discharged home after spending time  at the rehab unit.  She was discharged home on IV vancomycin, which she  is still taking through a PICC line, and p.o. rifampicin.   She was in her usual state of health until about a week ago when she  developed left lower extremity pain, particularly over her left knee.  She needed assistance to ambulate.  This pain has been getting worse.  She developed fever yesterday, and she was brought to the emergency  room.  There have not been any chills or nausea or vomiting.   The patient does not have increased pain in her back.  In fact, the back  pain is improving gradually.  She developed diarrhea.  No abdominal  pain.  No dysuria or increased frequency of micturition.  There is no  cough, chest pain, or shortness of breath.   The patient had initial evaluation in the emergency room with vital  signs showing a temperature of 100.1.  White cell count on CBC is  normal.  She has a hemoglobin of 7.8, and  hematocrit of 23.5.  Sed rate  was noted to be greater than 140.  Sed rate prior to discharge last  month was 119.   REVIEW OF SYSTEMS:  As in the HPI.   PAST MEDICAL HISTORY:  1. Severe lumbar stenosis involving L3-4 and L4-5.  2. L4-5 diskitis, with spread of infection to iliopsoas muscle.  The      patient is currently on IV vancomycin and p.o. rifampicin.  3. Bilateral hip osteoarthritis.  4. Diabetes mellitus type 2.  5. 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  6. Hypertension.  7. Asthma.  8. Lumbar decompression laminectomy by [name] in September 2008.   DISCHARGE MEDICATIONS ON December 28, 2006:  1. Hydrochlorothiazide 25 mg daily.  2. Bumex 4 mg daily.  3. Protonix 40 mg daily.  4. Senokot 2 tablets at bedtime.  5. Ferrous sulfate 325 mg daily.  6. Rifampicin 600 mg daily.  7. Vancomycin  1000 mg IV q.48 h., to be discontinued on January 28, 2007.  8. Multivitamin 1 tablet daily.  9. Glucophage 500 mg b.i.d.  10.Ultram 25 mg 4 times a day p.r.n.  11.Potassium chloride 20 mEq daily.  12.Tylenol 650 mg q.4 h. p.r.n.   ALLERGIES:  ASPIRIN.   FAMILY HISTORY:  Significant for CAD and diabetes.   SOCIAL HISTORY:  She does not drink alcohol or smoke cigarettes.  She  lives at home with her granddaughter.   PHYSICAL EXAMINATION:  INITIAL VITAL SIGNS:  Temperature 100.1, blood  pressure 115/73, pulse 108, respiratory rate 18, O2 saturations 97% on  room air.  GENERAL:  The patient is awake, alert, oriented to time, place, and  person.  Pale.  HEENT:  Anicteric.  NECK:  No elevated JVD.  No carotid bruits.  LUNGS:  Diminished air entry at lung bases.  No wheeze or rhonchi.  CVS:  S1, S2.  Systolic murmur.  ABDOMEN:  Not distended.  Soft, nontender.  Colostomy is functioning.  Bowel sounds present.  EXTREMITIES:  Bilateral pitting pedal edema.  She has a left PICC line  which is not tender.  There is no  discharge at the site of entry.  MUSCULOSKELETAL:  There is swelling and suprapatellar fullness on the  left knee, with diminished range of motion.  Increased warmth and  tenderness over the left knee, with this warmth spreading to upper leg,  but there is no erythema over the leg or visible erythema over the knee.  There is no remarkable tenderness on palpating the lumbar spine.  CNS:  No focal neurological deficits.   INITIAL LABORATORY DATA:  Sodium 137, potassium 3.8, chloride 106, CO2  20, glucose 156, BUN 15, creatinine 1.09, bilirubin 0.8.  LFTs normal,  except for albumin 2.3.  Sed rate is greater than 140.  Urinalysis:  Cloudy in appearance.  Specific gravity of 1.024, Negative for nitrites,  small leukocyte esterase, white cells 3-6, few bacteria, amorphous  material present, granular casts also present.  White cells 9.5,  hemoglobin 7.8, hematocrit 23.5, platelets 360, with mild left shift,  neutrophils 76%.   ASSESSMENT AND PLAN:  [name] is a 77 year old African American  female presenting with fever and left knee swelling and pain.  She has a  normal white cell count.  She has been on vancomycin and rifampicin for  L4-5 diskitis.   ADMISSION DIAGNOSES:  1. Fever and left knee swelling, probably gout.  Rule out septic      arthritis.  She has a PICC line with a site that looks okay.  She      has no increased low back pain.  I doubt this fever is related to      an infection, given the fact she has been on vancomycin and      rifampicin.  I will check x-ray of the left knee to rule out      fracture (the patient denies trauma).  Will ask interventional      radiology to aspirate left joint and send for Gram's stain,      culture, and crystals.  Will check serum uric acid level.  I am      going to hold off on MRI of the lumbar for now.  If left knee      aspiration is not helpful, will pursue MRI of the lumbar region to      further assess the diskitis.  Blood  culture  will be drawn from the      PICC line.  Will continue with vancomycin and p.o. rifampicin.      Will consider an infectious disease consult.  She does have      bilateral lower extremity edema.  We will rule out lower extremity      DVT.  Also check BNP.  Chest x-ray is pending.  2. Normocytic anemia.  There is no obvious source of bleeding.  It is      quite possible that she may have left hemarthrosis.  Will check      anemia panel and stool Hemoccult.  Will transfuse with 2 units of      packed red blood cells.  3. Diabetes mellitus type 2.  Will continue with metformin and put on      sliding-scale insulin.      Sandy B. Maia Petties, M.D.  Electronically Signed     MBB/MEDQ  D:  01/21/2007  T:  01/21/2007  Job:  [id number]   cc:   Harden Mo, M.D.

## 2010-07-29 NOTE — H&P (Signed)
[name], [name] NO.:  [id number]   MEDICAL RECORD NO.:  [id number]          PATIENT TYPE:  IPS   LOCATION:  4028                         FACILITY:  [facility]   PHYSICIAN:  Meredith Staggers, M.D.DATE OF BIRTH:  07/04/1932   DATE OF ADMISSION:  12/14/2006  DATE OF DISCHARGE:                              HISTORY & PHYSICAL   CHIEF COMPLAINT:  Back pain and leg pain.   HISTORY OF PRESENT ILLNESS:  This is a 77 year old African American  female admitted on September 16 with left hip pain and numbness in the  legs.  A recent MRI has shown spondylolisthesis and central cord  stenosis with diskitis at L4-5.  The patient was placed on empiric  vancomycin and Rocephin per ID.  Sed rate was 119.  Neurosurgery saw the  patient and ultimately the patient underwent an L3-4, L4-5 TLIF on  September 25 with decompressive laminectomy.  The patient was fitted  with a lumbosacral corset.  Dopplers were negative for DVT.  ID has  followed the patient for fever and there was question of whether she was  suffering from a drug fever.  Ceftriaxone was discontinued.  She  continues on vancomycin although this is on hold for a high trough level  of 32.5.  Blood cultures have been negative.  Rifampin is currently on  board after Cipro was discontinued today.  Antibiotics continue to  provide prophylaxis against infection in surrounding hardware.   REVIEW OF SYSTEMS:  Notable for weakness and some numbness in the legs  as well as reflux.  She denies problems with sleeping bowels and  bladder, pain overall, etc.  A full review is in the written H&P.   PAST MEDICAL HISTORY:  1. Diabetes.  2. Hypertension.  3. Asthma.  4. 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   She is negative for alcohol or tobacco use.   FAMILY HISTORY:  Positive for CAD and diabetes.   SOCIAL HISTORY:  The patient is retired, lives with her granddaughter in  a one-level house, three steps to enter.   Granddaughter can assist her  as needed at home.  Also has a daughter in the area that can check on  her.  The patient was independent with her cane prior to arrival.  Currently she is requiring min to mod assist for basic mobility and self-  care.   ALLERGIES:  ASPIRIN.   MEDICATIONS AT HOME:  1. Hydrochlorothiazide 25 mg daily.  2. Tramadol p.r.n.  3. Lisinopril 5 mg daily,  4,  Metformin 500 mg b.i.d.  1. Prednisone 10 mg daily.  2. Bumetanide 1 mg daily.  3. Potassium 20 mEq daily.  4. Neurontin 3 mg b.i.d.  5. Albuterol inhaler p.r.n.   LABS:  Hemoglobin 9.5, white count 8.7, platelets 460,000.  Sodium 138,  potassium 3.5, BUN and creatinine 13 and 1.2.   PHYSICAL EXAM:  Blood pressure is 154/80, pulse is 77, respiratory rate  16, temperature 97.7.  The patient is generally pleasant, alert and oriented x3.  Pupils equally round and reactive to light and accommodation.  Eye  movements are intact.  Nose and throat exam is unremarkable with fair to  poor dentition and pink, moist oral mucosa.  NECK:  Supple without JVD or lymphadenopathy.  CHEST:  Clear to auscultation bilaterally without wheezes, rales or  rhonchi.  HEART:  Regular rate and rhythm without murmur, rubs or gallops.  ABDOMEN:  Soft, nontender, with intact bowel sounds.  SKIN:  Notable for some slight drainage from the drain site distal to  the back wound.  The back wound itself is clean, dry, and intact.  NEUROLOGIC:  Cranial nerves II-XII are intact.  Reflexes are 1+ in the  upper extremities, 1+ to trace lower extremities.  Sensation decreased  distally in both legs, right greater than left.  Motor function was 5/5  in the arms.  She was 3/5 proximally in both legs.  Quads were 2 to 2+/5  in the right leg, 3+/5 on the left leg.  Right ankle dorsiflexion and  plantar flexion approximately 2- to 2/5 on the right and 3 to 3+/5 on  the left.  Cognitively the patient was appropriate good judgment,  orientation  and memory, and mood was intact.   ASSESSMENT/PLAN:  1. Functional deficits secondary to lumbar diskitis and central      stenosis, status post L3-, L4-5 TLIF/decompression on September 25.      Begin comprehensive inpatient rehab with PT to assess and treat for      range of motion strengthening, transfers, etc.  OT will assess and      treat for range of motion, strengthening, ADLs and equipment.      Rehab nursing will follow on a 24-hour basis for bowel, bladder,      skin care, medication issues and pain.  Rehab case manager/social      worker will assess for psychosocial needs and discharge planning.      Estimated length of stay is 10+ days.  Goals:  Supervision to      modified independent.  Prognosis:  2. Deep vein thrombosis prophylaxis:  Subcu Lovenox.  3. Infectious disease:  Continue rifampin and resume vancomycin once      trough level decreases.  4. Pain management with p.r.n. oxycodone and Tylenol.  Seems to be      under fair control at this point.  5. Asthma:  Will monitor the patient for now, checking O2 saturations      and resuming inhaler as appropriate.  6. Neuropathy and neuropathic pain:  Continue Neurontin and Ultram.  7. Diabetes:  Lantus 15 units subcu h.s. with sliding scale insulin      backup.      Meredith Staggers, M.D.  Electronically Signed     ZTS/MEDQ  D:  12/14/2006  T:  12/15/2006  Job:  [id number]

## 2010-08-01 NOTE — H&P (Signed)
[name], [name] NO.:  [id number]   MEDICAL RECORD NO.:  [id number]          PATIENT TYPE:  INP   LOCATION:  [id number]                         FACILITY:  [facility]   PHYSICIAN:  Cherene Altes, M.D.DATE OF BIRTH:  September 02, 1933   DATE OF ADMISSION:  03/14/2005  DATE OF DISCHARGE:                                HISTORY & PHYSICAL   CHIEF COMPLAINT:  Abdominal pain with intractable vomiting.   PRIMARY MEDICAL DOCTOR:  Dr. Jake Michaelis.   HISTORY OF PRESENT ILLNESS:  [name] is a very pleasant 77 year old  female who is reasonably healthy.  She presents with her granddaughter to  the emergency room after experiencing approximately 14 hours of severe  nausea with intractable vomiting.  She has vomited approximately 5-6 times.  There has been no hematemesis.  There has been diffuse abdominal pain which  the patient describes as crampy and radiating directly through to the back.  There has been no significant alleviating factors but there has also been no  significant exacerbating factors.  The pain has been consistent and vomiting  has been persistent since onset of symptoms.  All symptoms began last night  after consuming a large pizza meal.  The patient has not experienced  symptoms like this since she had a spontaneous rupture of the colon  requiring colectomy approximately seven years ago.  The patient does have an  ostomy from her spontaneous rupture requiring colectomy.  Ostomy output has  been stable.  There has been very limited p.o. intake since onset of  symptoms approximately 14 hours ago because of severe nausea.  The patient  does not drink alcohol.  There have been no changes in her medical regimen  recently.  There has been no bloody output in the ostomy bag and there has  been no change in the nature of the ostomy.   REVIEW OF SYSTEMS:  Review of systems is unremarkable with the exception of  the positive elements noted in the history of present  illness above.  The  patient was in her usual state of health until last night.   MEDICATIONS:  1.  HCTZ 25 mg p.o. every day.  2.  Potassium chloride 20 mEq p.o. every day.  3.  Albuterol two puffs q.i.d.   ALLERGIES:  ASPIRIN.   FAMILY HISTORY:  Noncontributory secondary to age.   SOCIAL HISTORY:  The patient lives in [place].  She lives with her  granddaughter.  She does not smoke.  She does dip snuff on a constant  basis.  She does not drink alcohol.  She is retired from housekeeping at  [employer].   DATA REVIEW:  White count is elevated at 12.4, hemoglobin is normal, MCV is  normal.  There is a left shift.  Platelet count is normal at 229.  Lipase is  102 which is mildly elevated.  Electrolytes are balanced at present.  Serum  glucose is high at 177.  Chest x-ray reveals no acute disease.  KUB reveals  an air filled loop of the small bowel in the left abdomen consistent with  a  sentinel loop.  PH is 7.43, pCO2 is 47, pO2 is not available on a venous  gas.  CT of the chest, dated back to March 2006, is reviewed and reveals a 5  x 7 right suprahilar peribronchial node and a prominent esophagus with  suggestion for followup in eight weeks.   PHYSICAL EXAMINATION:  VITAL SIGNS:  Temperature 97.8, blood pressure  139/83, heart rate 70, respiratory rate 20, O2 sat is 100% on room air.  GENERAL:  Well-developed, well-nourished female in no acute respiratory  distress.  HEENT:  Normocephalic, atraumatic.  Pupils equal, round, reactive to light  and accommodation.  Extraocular muscles intact bilaterally.  OC/OP clear.  NECK:  No JVD.  No lymphadenopathy.  No thyromegaly.  LUNGS:  Clear to auscultation bilaterally without wheezes or rhonchi.  CARDIOVASCULAR:  Regular rate and rhythm without murmur, gallop or rub.  Normal S1 and S2.  ABDOMEN:  There is a large midline abdominal scar consistent with  laparotomy.  The patient does have a left lower quadrant colostomy.   The  colostomy is nice and pink and puckered.  There is brown nondescript stool  in the ostomy bag.  There is mild tenderness to deep palpation throughout.  The abdomen is mildly distended but there is no rebound.  There are no  appreciable masses.  Bowel sounds are absent.  EXTREMITIES:  No cyanosis, clubbing, edema bilateral lower extremities.  NEUROLOGIC:  Cranial nerves II-XII intact bilaterally.  Alert and oriented  x4.  Strength 5/5 bilateral upper and lower extremities.  Intact sensation  to touch throughout.  No Babinski.   IMPRESSION AND PLAN:  1.  Mild pancreatitis.  The exact cause of the patient's pancreatitis is not      clear at this time.  She reports that she does not drink at all and I am      inclined to believe her.  There have been no recent medication changes.      Certainly, this could be related to the intake a high fat meal.  LFTs      were not obtained by the emergency room physician and I will request      these to help further delineate the possibility of gallstone/gallbladder      disease.  Physical exam does not suggest acute cholecystitis, though it      is certainly the patient could have simply passed a gallstone.  CT scan      of the abdomen is being obtained to evaluate the patient's pancreas.  In      the setting of a questionable peribronchial node and also a prominent      esophagus, I feel that this is the best test because it will allow [place] a      full evaluation of the GI tract.  We will also get a CT scan of the      chest at the same time.  The patient will be kept on a clear liquid diet      only.  Antiemetics will be administered and anti-pain medications.  Diet      will be advanced at such time the patient is deemed to be stable to      tolerate such.  We must also consider obstruction.  The patient is,      however, moving her bowels into her ostomy bag and this makes this less      likely.  CT scan will better allow [place] to evaluate  this. 2.   A 5 x 7 right suprahilar peribronchial node with prominent esophagus.      These findings were noted on a CT scan of the chest dated March 2006.      The patient reports that she has not had a CT scan since that time.  I      feel that followup would be appropriate to assure that there is no      change in these findings as one would be concerned for occult      malignancy.  A CT scan of the chest will be ordered along with CT scan      of the abdomen and pelvis along with contrast.  Renal function is      normal.  3.  Hypertension.  The patient's blood pressure is currently reasonably      controlled.  I will hold HCTZ as I will be administering IV fluid.      Blood pressure will be followed closely.  4.  Asthma.  The patient has a reported history of asthma.  She has no      history of smoking whatsoever and no history of cotton dust or other      occupational exposure.  I will continue albuterol on a p.r.n. basis.      One could consider PFTs in the outpatient setting to further delineate      the nature of the patient's pulmonary disease.  She does appear to be      stable at this time.      Cherene Altes, M.D.  Electronically Signed    JTM/MEDQ  D:  03/14/2005  T:  03/14/2005  Job:  [id number]

## 2010-08-01 NOTE — Consult Note (Signed)
[name], [name]                ACCOUNT NO.:  [id number]   MEDICAL RECORD NO.:  [id number]          PATIENT TYPE:  INP   LOCATION:  5705                         FACILITY:  [facility]   PHYSICIAN:  Merri Ray. Grandville Silos, M.D.DATE OF BIRTH:  1934/01/15   DATE OF CONSULTATION:  DATE OF DISCHARGE:                                   CONSULTATION   REFERRING SERVICE:  [facility] B Team.   CHIEF COMPLAINT:  Abdominal pain, small-bowel obstruction and parastomal  herniation.   HISTORY OF PRESENT ILLNESS:  We were asked to see this pleasant 77 year old  African American female with a past history of proctocolectomy with end  colostomy done by Dr. Kathreen Cornfield approximately 7 years ago in regards to  a bowel obstruction.  The patient developed some acute parastomal swelling  and abdominal pain, and presented to the hospital with signs and symptoms of  small-bowel obstruction.  She has been treated medically in the interim with  nasogastric tube decompression and bowel rest, and has improved  significantly.  Indeed, her CAT scan today is significantly improved.  Her  pain has decreased, but she still has some peristomal soreness.   PAST MEDICAL HISTORY:  1.  Hypertension.  2.  Asthma.   PAST SURGICAL HISTORY:  1.  Proctocolectomy with end colostomy as above.  2.  TAH 15 years ago.  3.  Excision of skin lesion from the chest.   FAMILY HISTORY:  Noncontributory.   SOCIAL HISTORY:  She lives with her granddaughter.  She does not smoke or  drink alcohol.  She occasionally uses snuff.   ALLERGIES:  ASPIRIN and SOME ASTHMA MEDICATION of no further identification.   MEDICATIONS:  Please see the MAR.   PHYSICAL EXAMINATION:  VITAL SIGNS:  Temperature is 98.4, blood pressure  156/94, heart rate 60, respirations 18.  HEENT:  Unremarkable.  NEUROLOGIC:  She is awake and oriented, moving all extremities without  obvious deficit.  CHEST:  Chest is clear to auscultation with good respiratory  excursion.  HEART:  Regular.  No murmur is heard and pulse is palpable in the left  chest.  ABDOMEN:  Abdomen is mildly distended.  Bowel sounds are active.  Her  abdomen is soft with some mild parastomal tenderness, but no guarding or  appreciation of a distinct abdominal wall defect.  There is a large pink  stoma with some brown liquid stool present in her collection bag.  EXTREMITIES:  Extremities have no significant edema.  SKIN:  Skin is warm and dry.   LABORATORY STUDIES:  Laboratory studies include white blood count of 5.8,  lipase of 16, TSH and liver function tests within normal limits, INR 1.0.   RADIOLOGIC FINDINGS:  X-ray films are as above.   IMPRESSION:  Small-bowel obstruction secondary to parastomal hernia; this  has resolved.  Hopefully, she will continue to improve with medical therapy  so we will have the luxury of planning for an elective outpatient repair of  her parastomal hernia.  If she takes another turn for the worst in regards  to reaccumulation of obstructive symptoms, we will proceed  with operative  intervention on this hospitalization.  This plan was discussed in detail  with the patient and we will follow her closely with you.   Thank you.      Merri Ray Grandville Silos, M.D.  Electronically Signed     BET/MEDQ  D:  03/19/2005  T:  03/20/2005  Job:  [id number]

## 2010-08-01 NOTE — Op Note (Signed)
[name], [name]                          ACCOUNT NO.:  [id number]   MEDICAL RECORD NO.:  [id number]                   PATIENT TYPE:  AMB   LOCATION:  [place]                                  FACILITY:  [facility]   PHYSICIAN:  Silvestre Moment, M.D.                    DATE OF BIRTH:  1933/04/29   DATE OF PROCEDURE:  04/28/2002  DATE OF DISCHARGE:                                 OPERATIVE REPORT   PREOPERATIVE DIAGNOSES:  Bilateral inferior turbinate hypertrophy, chronic  polypoid maxillary, ethmoid, frontal, and sphenoid sinusitis with intranasal  polyposis.   POSTOPERATIVE DIAGNOSES:  Bilateral inferior turbinate hypertrophy, chronic  polypoid maxillary, ethmoid, frontal, and sphenoid sinusitis with intranasal  polyposis.   PROCEDURE:  Bilateral inferior turbinate reductions, bilateral maxillary  antrotomies, bilateral total ethmoidectomies, bilateral frontal recess  explorations, bilateral sphenoidotomies, Instatrac guidance.   SURGEON:  Silvestre Moment, M.D.   ANESTHESIA:  General endotracheal anesthesia.   ESTIMATED BLOOD LOSS:  1050 cc.   SPECIMENS:  Bilateral sinus contents.   COMPLICATIONS:  None.   INDICATION:  This patient is a 77 year old female who has had a many-year  history of nasal obstruction.  She was found to have profuse intranasal  polyps, bilaterally.  A CT scan revealed complete opacification of all of  the sinus cavities which did not improve with antibiotic and steroid  therapy.   FINDINGS:  The patient was noted to have profuse intranasal polyposis which  extended into the sinus cavities.  There were polyps within both of the  maxillary sinus cavity, filling the ethmoid cavities, and sphenoid sinus  openings.  There was moderate inferior turbinate hypertrophy; however, the  septum was relatively midline, and for this reason, septoplasty was not  performed.   DESCRIPTION OF PROCEDURE:  The patient was taken to the operating room and  placed on the table in  the supine position.  She was then placed under  general endotracheal anesthesia and the table rotated counterclockwise 125  degrees.  The eyes were lubricated, and the nose prepped with Betadine and  draped in the usual sterile fashion.  The Instatrac head gear was applied.  Calibration was performed.  Each of the nasal cavities were decongested with  Afrin on cottonoid pledgets.  The 0- and 30-degree nasal scopes were used  throughout the sinus surgery.  The left nasal cavity and sinus surgery was  performed first.  The 0-degree Storz-Hopkins endoscope was used to visualize  the left nasal cavity.  Polypoids were injected with 1% lidocaine with  1:100,000 epinephrine.  The microdebrider was then used to remove the  intranasal polyps.  The uncinate process was identified and injected with 1%  lidocaine with 1:100,000 epinephrine as was the middle turbinate.  The mid-  portion of the uncinate process was taken down using the pediatric  backbiting forceps.  The microdebrider was also used to take down the  inferior and superior portions of the uncinate process.  The natural ostia  was identified and enlarged.  It was enlarged using the microdebrider and  backbiting forceps.  This mucus was suctioned out from the left maxillary  sinus.  The Instatrac was then used throughout the ethmoidectomy portion of  the procedure.  The ethmoid bulla was entered low and medial with the  microdebrider.  The microdebrider was then used to remove redundant bone  including the ground lamella and very prominent polypoid tissue.  There was  significant bleeding during the ethmoidectomy portion of both sides of the  procedure.  Ethmoidectomy continued to the skull base posteriorly and  inferiorly and then progressed anteriorly and superiorly.  There was very  thick bone posteriorly.  Instatrac guidance was used during this portion of  the procedure as it was in the frontal recess and sphenoidectomy portions of   both sides of the face.  The frontal recess was identified after taking down  agger nasi cell using the 30-degree Storz-Hopkins endoscope.  The natural  recess and ostia was identified.  This was confirmed using Instatrac and  also by using a probe to place up the frontal recess.  The overlying  redundant mucosa was removed; however, no mucosa within the frontal recess  was disturbed.  At this point, it was deemed necessary to perform  sphenoidotomy through the sphenoid face adjacent to the septum.  This was  identified, confirmed with Instatrac, and the area opened.  The natural  ostia could not be identified.  Microdebrider was then used to open up the  opening from the sphenoid sinus.  Thick mucus from the sphenoid sinus was  suctioned out.  Afrin packs were placed in the ethmoid cavity, and attention  turned to the right side.  In a similar fashion, the polyps were injected  and debrided with the microdebrider.  The uncinate process and middle  turbinates were injected with the same anesthetic.  This was 1% lidocaine  with 1:100,000 of epinephrine.  The mid-portion of the uncinate process was  taken down using the backbiting forceps as well as the microdebrider.  The  superior portion of the uncinate process was also taken down.  Polyps around  the maxillary ostia were taken down using the microdebrider, and the natural  ostia widely enlarged.  The ethmoid bulla was entered low and medial, and  the anterior and posterior ethmoid air cells taken down using the  microdebrider with the use of the Instatrac and the 0-degree scope.  Skull  base was identified posteriorly, and cells removed posterior to anterior and  superior direction.  The sphenoid sinus was entered through the ethmoid  sinus.  This was done using Instatrac, and then the area was opened medially  using the microdebrider.  At this point, the agger nasi cell was taken down, the frontal recess identified with the Instatrac as  well as the probe which  entered the frontal sinus.  The area around it with redundant mucosa and  bone was taken down using the microdebrider.   At this point, both of the inferior turbinates were injected with 1%  lidocaine with 1:100,000 of epinephrine.  The microdebrider was then used to  resect redundant mucosa and through-cut forceps used to take down the  redundant inferior turbinate bone.  Suction cautery was used for hemostasis.  Packs coated with Bactroban ointment were placed in both sides of the  ethmoid cavities and tied anterior to the columella to one  another.  Large  Merocel packs were also coated with Bactroban ointment and placed in both  sides of the nasal cavity and tied to one another anterior to the columella.  The oral cavity was suctioned.  An NG tube was placed down the esophagus for  suction of the gastric contents.  The table was rotated clockwise 125  degrees.  The patient was awakened from anesthesia and taken to the post-  anesthesia care unit in stable condition.  There were no complications.                                               Silvestre Moment, M.D.    SJ/MEDQ  D:  04/28/2002  T:  04/28/2002  Job:  [id number]   cc:   [facility]   Harden Mo, M.D.  [address]  [place]  [place] [address]  Fax: [phone]

## 2010-08-01 NOTE — Assessment & Plan Note (Signed)
[facility]                             PULMONARY OFFICE NOTE   JARITZA, MINDER                       MRN:          [id number]  DATE:06/03/2006                            DOB:          December 28, 1933    PROBLEM LIST:  1. Asthma.  2. Nasal polyps/aspirin/asthma triad/allergic rhinitis.   HISTORY OF PRESENT ILLNESS:  The patient has had a cold for 2 weeks with  wheeze and cough but no fever, sore throat, or sinus pain. He has been  on Lisinopril for a year with no problems. We discussed chronic cough  and urticaria. Seems to be no reason to change it.   MEDICATIONS:  Vitamins, Caltrate, potassium, Lisinopril 5 mg, albuterol  rescue inhaler.   ALLERGIES:  NO KNOWN DRUG ALLERGIES.   OBJECTIVE:  VITAL SIGNS:  Weight 155 pounds. Pulse regular at 67. Room  air saturation 97%.  LUNGS:  Mild wheeze.  HEENT:  No nasal congestion or post-nasal drip.  HEART:  Sounds are regular and normal.  EXTREMITIES:  No edema.   IMPRESSION:  Exacerbation of asthma.   PLAN:  Nebulizer treatment. Xopenex 1.25 mg. Depo-Medrol 80 mg IM.  Refill albuterol for p.r.n. use as discussed. Schedule return in 1 year,  earlier p.r.n.     Clinton D. Annamaria Boots, MD, [place], [place]  Electronically Signed    CDY/MedQ  DD: 06/05/2006  DT: 06/05/2006  Job #: [id number]   cc:   Harden Mo, M.D.

## 2010-08-01 NOTE — Discharge Summary (Signed)
[name], VOLO                ACCOUNT NO.:  [id number]   MEDICAL RECORD NO.:  [id number]          PATIENT TYPE:  INP   LOCATION:  5705                         FACILITY:  [facility]   PHYSICIAN:  Jonna L. Gwynneth Aliment, M.D.DATE OF BIRTH:  06-21-1933   DATE OF ADMISSION:  03/14/2005  DATE OF DISCHARGE:                                 DISCHARGE SUMMARY   PRIMARY CARE PHYSICIAN:  Harden Mo, M.D.   CONSULTATIONS:  General surgery, [facility]. Grandville Silos, M.D.   FINAL DIAGNOSES:  1.  Partial small bowel obstruction.  2.  Parastomal hernia.  3.  Intractable nausea and vomiting.  4.  Essential hypertension.  5.  New diagnosis of diabetes mellitus.  6.  Hypokalemia.   ALLERGIES:  ASPIRIN.   CODE STATUS:  Full.   HISTORY:  This is a 77 year old with a day history of intractable nausea and  vomiting and diffuse abdominal pain. 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 The patient has not noticed any bloody drainage.   The patient has a history of hypertension for which he is on  hydrochlorothiazide and potassium.   PHYSICAL EXAMINATION:  On admission was noticeable on the abdomen to have a  large midline abdominal scar, left lower quadrant colostomy, tenderness to  palpation around the area.   Initial laboratory work showed a sugar of 177. KUB showed air-fluid loops in  the small bowel. There was also note of a CT of the chest from March 2006  which showed a suprahilar peribronchial node that needed to be followed up.   HOSPITAL COURSE:  The patient had several days of intermittent pain, nausea,  and vomiting, some difficulty passing gas. She was kept on clear liquids and  for a couple of days was NPO with intermittent improvement and again  discomfort on March 18, 2004. She was seen in consultation by Dr. Grandville Silos  who noted a parastomal herniation and although her initial small bowel  obstruction was  improving she is going to need to repair of this hernia.   As far as the diabetes goes, she was not eating that much. Her sugars were  in the 130 to 150 range and A1c was 7.1 which is suggestive of some mild to  moderate diabetes. However, she was not started on anything other than some  sliding scale insulin during her stay. She had some mild hypokalemia which  was replaced.   DISPOSITION:  The patient will be discharged back on her hydrochlorothiazide  25 mg daily with the addition of lisinopril 5 mg daily both to keep her  potassium up and to be renal protective. I am told that she will probably  need to start on diabetic pills, but have referred her to Dr. Jake Michaelis to do  so. When she is eating a completely normal diet this can be re-evaluated.  The patient will finish up four more days of Cipro 250 b.i.d. and Flagyl 500  mg b.i.d. She will return to see Dr. Grandville Silos on January 10th. She  should  return to see Dr. Jake Michaelis in two weeks regarding management of this new  diagnosis of diabetes. She is to stay on a mostly liquid diet, eat very  lightly for the next week.      Jonna L. Gwynneth Aliment, M.D.  Electronically Signed     JLB/MEDQ  D:  03/20/2005  T:  03/20/2005  Job:  [id number]   cc:   Harden Mo, M.D.  Fax: [place] Grandville Silos, M.D.  [facility]  [address] [place], [place] [address]

## 2010-08-09 ENCOUNTER — Emergency Department (HOSPITAL_COMMUNITY)
Admission: EM | Admit: 2010-08-09 | Discharge: 2010-08-09 | Disposition: A | Discharge status: Home / Self Care | Payer: Medicare Other | Attending: Emergency Medicine | Admitting: Emergency Medicine

## 2010-08-09 ENCOUNTER — Emergency Department (HOSPITAL_COMMUNITY): Payer: Medicare Other

## 2010-08-09 DIAGNOSIS — E119 Type 2 diabetes mellitus without complications: Secondary | ICD-10-CM | POA: Insufficient documentation

## 2010-08-09 DIAGNOSIS — I10 Essential (primary) hypertension: Secondary | ICD-10-CM | POA: Insufficient documentation

## 2010-08-09 DIAGNOSIS — Z862 Personal history of diseases of the blood and blood-forming organs and certain disorders involving the immune mechanism: Secondary | ICD-10-CM | POA: Insufficient documentation

## 2010-08-09 DIAGNOSIS — R109 Unspecified abdominal pain: Secondary | ICD-10-CM | POA: Insufficient documentation

## 2010-08-09 DIAGNOSIS — J45909 Unspecified asthma, uncomplicated: Secondary | ICD-10-CM | POA: Insufficient documentation

## 2010-08-09 DIAGNOSIS — R197 Diarrhea, unspecified: Secondary | ICD-10-CM | POA: Insufficient documentation

## 2010-08-09 DIAGNOSIS — Z8639 Personal history of other endocrine, nutritional and metabolic disease: Secondary | ICD-10-CM | POA: Insufficient documentation

## 2010-08-09 DIAGNOSIS — K625 Hemorrhage of anus and rectum: Secondary | ICD-10-CM | POA: Insufficient documentation

## 2010-08-09 DIAGNOSIS — Z79899 Other long term (current) drug therapy: Secondary | ICD-10-CM | POA: Insufficient documentation

## 2010-08-09 LAB — POCT I-STAT, CHEM 8
BUN: 12 mg/dL (ref 6–23)
Creatinine, Ser: 1 mg/dL (ref 0.4–1.2)
Hemoglobin: 12.6 g/dL (ref 12.0–15.0)
Potassium: 3.4 mEq/L — ABNORMAL LOW (ref 3.5–5.1)
Sodium: 143 mEq/L (ref 135–145)

## 2010-08-09 LAB — CBC
HCT: 37.3 % (ref 36.0–46.0)
MCH: 29.7 pg (ref 26.0–34.0)
MCHC: 33.2 g/dL (ref 30.0–36.0)
MCV: 89.2 fL (ref 78.0–100.0)
RDW: 13.9 % (ref 11.5–15.5)

## 2010-08-09 LAB — DIFFERENTIAL
Eosinophils Relative: 3 % (ref 0–5)
Lymphocytes Relative: 20 % (ref 12–46)
Lymphs Abs: 1.6 10*3/uL (ref 0.7–4.0)
Monocytes Absolute: 0.6 10*3/uL (ref 0.1–1.0)
Monocytes Relative: 7 % (ref 3–12)

## 2010-12-23 LAB — URINE MICROSCOPIC-ADD ON

## 2010-12-23 LAB — CBC
HCT: 21.3 — ABNORMAL LOW
HCT: 23.5 — ABNORMAL LOW
HCT: 25.9 — ABNORMAL LOW
HCT: 27.1 — ABNORMAL LOW
Hemoglobin: 9.1 — ABNORMAL LOW
MCHC: 33.3
MCHC: 33.5
MCHC: 33.8
MCV: 83.8
MCV: 84
MCV: 84.3
MCV: 84.8
MCV: 84.8
Platelets: 342
Platelets: 354
Platelets: 360
Platelets: 381
RBC: 2.54 — ABNORMAL LOW
RBC: 2.79 — ABNORMAL LOW
RBC: 3.2 — ABNORMAL LOW
RBC: 3.22 — ABNORMAL LOW
RDW: 15.2
RDW: 15.3 — ABNORMAL HIGH
RDW: 15.3 — ABNORMAL HIGH
WBC: 10.2
WBC: 4.6
WBC: 8.4
WBC: 9.2
WBC: 9.5

## 2010-12-23 LAB — COMPREHENSIVE METABOLIC PANEL
ALT: <8
AST: 10
Albumin: 1.7 — ABNORMAL LOW
Alkaline Phosphatase: 90
Alkaline Phosphatase: 91
BUN: 12
BUN: 12
BUN: 15
CO2: 20
CO2: 22
Calcium: 8.3 — ABNORMAL LOW
Chloride: 106
Chloride: 108
Creatinine, Ser: 0.91
Creatinine, Ser: 1.09
GFR calc Af Amer: >60
GFR calc non Af Amer: 49 — ABNORMAL LOW
GFR calc non Af Amer: >60
Glucose, Bld: 156 — ABNORMAL HIGH
Potassium: 3.3 — ABNORMAL LOW
Potassium: 3.4 — ABNORMAL LOW
Sodium: 140
Total Bilirubin: 0.6
Total Bilirubin: 0.8
Total Protein: 6

## 2010-12-23 LAB — DIFFERENTIAL
Basophils Absolute: 0.1
Basophils Absolute: 0.2 — ABNORMAL HIGH
Basophils Relative: 1
Basophils Relative: 1
Eosinophils Absolute: 0
Eosinophils Relative: 0
Lymphocytes Relative: 11 — ABNORMAL LOW
Lymphocytes Relative: 13
Lymphs Abs: 1.3
Monocytes Absolute: 0.8 — ABNORMAL HIGH
Monocytes Relative: 8
Neutro Abs: 7.5
Neutro Abs: 8 — ABNORMAL HIGH
Neutrophils Relative %: 79 — ABNORMAL HIGH

## 2010-12-23 LAB — CULTURE, BLOOD (ROUTINE X 2)
Culture: NO GROWTH
Culture: NO GROWTH
Culture: NO GROWTH

## 2010-12-23 LAB — VANCOMYCIN, TROUGH: Vancomycin Tr: 26.4

## 2010-12-23 LAB — OCCULT BLOOD X 1 CARD TO LAB, STOOL: Fecal Occult Bld: NEGATIVE

## 2010-12-23 LAB — BASIC METABOLIC PANEL
BUN: 17
BUN: 21
CO2: 23
CO2: 24
CO2: 26
Calcium: 8.2 — ABNORMAL LOW
Calcium: 8.6
Chloride: 104
Chloride: 108
Chloride: 109
Creatinine, Ser: 1.41 — ABNORMAL HIGH
Creatinine, Ser: 1.81 — ABNORMAL HIGH
Creatinine, Ser: 1.92 — ABNORMAL HIGH
GFR calc Af Amer: 31 — ABNORMAL LOW
GFR calc Af Amer: 33 — ABNORMAL LOW
GFR calc Af Amer: 44 — ABNORMAL LOW
GFR calc Af Amer: >60
GFR calc non Af Amer: 27 — ABNORMAL LOW
Potassium: 3.4 — ABNORMAL LOW
Potassium: 5.1
Sodium: 135
Sodium: 137

## 2010-12-23 LAB — APTT: aPTT: 43 — ABNORMAL HIGH

## 2010-12-23 LAB — SYNOVIAL CELL COUNT + DIFF, W/ CRYSTALS
Lymphocytes-Synovial Fld: 2
Monocyte-Macrophage-Synovial Fluid: 16 — ABNORMAL LOW

## 2010-12-23 LAB — URINE CULTURE

## 2010-12-23 LAB — CROSSMATCH

## 2010-12-23 LAB — BODY FLUID CULTURE: Culture: NO GROWTH

## 2010-12-23 LAB — IRON AND TIBC
Iron: <10 — ABNORMAL LOW
UIBC: 123

## 2010-12-23 LAB — URINALYSIS, ROUTINE W REFLEX MICROSCOPIC
Glucose, UA: NEGATIVE
Hgb urine dipstick: NEGATIVE
Protein, ur: 30 — AB

## 2010-12-23 LAB — RETICULOCYTES
RBC.: 2.53 — ABNORMAL LOW
Retic Count, Absolute: 27.8
Retic Ct Pct: 1.1

## 2010-12-23 LAB — SEDIMENTATION RATE
Sed Rate: 92 — ABNORMAL HIGH
Sed Rate: >140 — ABNORMAL HIGH

## 2010-12-23 LAB — PROTIME-INR
INR: 1.2
Prothrombin Time: 15.6 — ABNORMAL HIGH

## 2010-12-23 LAB — VITAMIN B12: Vitamin B-12: 318 (ref 211–911)

## 2010-12-23 LAB — RHEUMATOID FACTOR: Rhuematoid fact SerPl-aCnc: <20

## 2010-12-25 LAB — CULTURE, BLOOD (ROUTINE X 2)
Culture: NO GROWTH
Culture: NO GROWTH
Culture: NO GROWTH

## 2010-12-25 LAB — BASIC METABOLIC PANEL
BUN: 16
BUN: 13
BUN: 17
BUN: 7
BUN: 8
CO2: 32
CO2: 27
CO2: 27
CO2: 28
CO2: 28
CO2: 30
CO2: 23
CO2: 24
Calcium: 8.3 — ABNORMAL LOW
Calcium: 8.4
Calcium: 8.8
Calcium: 8.8
Calcium: 8.8
Chloride: 87 — ABNORMAL LOW
Chloride: 98
Chloride: 101
Chloride: 102
Chloride: 103
Chloride: 103
Chloride: 104
Chloride: 106
Chloride: 104
Creatinine, Ser: 1.24 — ABNORMAL HIGH
Creatinine, Ser: 0.73
Creatinine, Ser: 0.78
Creatinine, Ser: 0.76
Creatinine, Ser: 0.79
Creatinine, Ser: 1.08
GFR calc Af Amer: 40 — ABNORMAL LOW
GFR calc Af Amer: 44 — ABNORMAL LOW
GFR calc Af Amer: 51 — ABNORMAL LOW
GFR calc Af Amer: 54 — ABNORMAL LOW
GFR calc Af Amer: >60
GFR calc Af Amer: >60
GFR calc Af Amer: >60
GFR calc Af Amer: >60
GFR calc Af Amer: >60
GFR calc Af Amer: >60
GFR calc Af Amer: >60
GFR calc non Af Amer: 33 — ABNORMAL LOW
GFR calc non Af Amer: 40 — ABNORMAL LOW
GFR calc non Af Amer: 41 — ABNORMAL LOW
GFR calc non Af Amer: 45 — ABNORMAL LOW
GFR calc non Af Amer: 50 — ABNORMAL LOW
GFR calc non Af Amer: >60
GFR calc non Af Amer: >60
Glucose, Bld: 180 — ABNORMAL HIGH
Glucose, Bld: 136 — ABNORMAL HIGH
Glucose, Bld: 169 — ABNORMAL HIGH
Glucose, Bld: 135 — ABNORMAL HIGH
Glucose, Bld: 142 — ABNORMAL HIGH
Potassium: 2.8 — ABNORMAL LOW
Potassium: 3.7
Potassium: 3.5
Potassium: 3.6
Potassium: 3.7
Potassium: 4.2
Potassium: 4.6
Sodium: 134 — ABNORMAL LOW
Sodium: 137
Sodium: 137
Sodium: 137
Sodium: 138
Sodium: 139
Sodium: 134 — ABNORMAL LOW
Sodium: 134 — ABNORMAL LOW
Sodium: 136
Sodium: 137

## 2010-12-25 LAB — URINALYSIS, ROUTINE W REFLEX MICROSCOPIC
Bilirubin Urine: NEGATIVE
Glucose, UA: NEGATIVE
Glucose, UA: NEGATIVE
Hgb urine dipstick: NEGATIVE
Hgb urine dipstick: NEGATIVE
Ketones, ur: NEGATIVE
Nitrite: NEGATIVE
Protein, ur: NEGATIVE
Protein, ur: NEGATIVE
Specific Gravity, Urine: 1.034 — ABNORMAL HIGH
Specific Gravity, Urine: 1.018
Urobilinogen, UA: 1
Urobilinogen, UA: 1
pH: 5

## 2010-12-25 LAB — CBC
HCT: 24.9 — ABNORMAL LOW
HCT: 25.5 — ABNORMAL LOW
HCT: 28.8 — ABNORMAL LOW
HCT: 30 — ABNORMAL LOW
HCT: 30.4 — ABNORMAL LOW
HCT: 28.2 — ABNORMAL LOW
Hemoglobin: 9.5 — ABNORMAL LOW
Hemoglobin: 8.3 — ABNORMAL LOW
Hemoglobin: 8.3 — ABNORMAL LOW
Hemoglobin: 9.5 — ABNORMAL LOW
Hemoglobin: 9.7 — ABNORMAL LOW
Hemoglobin: 9 — ABNORMAL LOW
Hemoglobin: 9.1 — ABNORMAL LOW
Hemoglobin: 9.5 — ABNORMAL LOW
MCHC: 33.3
MCHC: 33.1
MCHC: 33.2
MCHC: 33.3
MCHC: 33.3
MCHC: 33.5
MCHC: 33.6
MCHC: 33.4
MCHC: 33.4
MCV: 87.5
MCV: 87.5
MCV: 88.1
MCV: 88.8
MCV: 88.9
MCV: 89.5
MCV: 89.7
MCV: 90.3
MCV: 88.5
Platelets: 333
Platelets: 438 — ABNORMAL HIGH
Platelets: 447 — ABNORMAL HIGH
Platelets: 460 — ABNORMAL HIGH
Platelets: 362
RBC: 3.24 — ABNORMAL LOW
RBC: 3.28 — ABNORMAL LOW
RBC: 2.83 — ABNORMAL LOW
RBC: 2.84 — ABNORMAL LOW
RBC: 2.87 — ABNORMAL LOW
RBC: 3.4 — ABNORMAL LOW
RBC: 3 — ABNORMAL LOW
RBC: 3.04 — ABNORMAL LOW
RBC: 3.17 — ABNORMAL LOW
RBC: 3.42 — ABNORMAL LOW
RDW: 12.7
RDW: 13.1
RDW: 13.6
RDW: 12.5
RDW: 12.5
RDW: 12.8
WBC: 10
WBC: 9.6
WBC: 11.4 — ABNORMAL HIGH
WBC: 11.7 — ABNORMAL HIGH
WBC: 8.3
WBC: 9.7

## 2010-12-25 LAB — COMPREHENSIVE METABOLIC PANEL
ALT: 26
AST: 28
AST: 34
Albumin: 1.4 — ABNORMAL LOW
Alkaline Phosphatase: 100
CO2: 29
Calcium: 8.4
Calcium: 8.5
Chloride: 102
Creatinine, Ser: 0.93
GFR calc Af Amer: 41 — ABNORMAL LOW
GFR calc Af Amer: >60
GFR calc non Af Amer: 34 — ABNORMAL LOW
GFR calc non Af Amer: 59 — ABNORMAL LOW
Potassium: 3.6
Sodium: 138
Sodium: 137
Total Protein: 5.4 — ABNORMAL LOW

## 2010-12-25 LAB — DIFFERENTIAL
Basophils Absolute: 0.1
Basophils Absolute: 0.1
Basophils Relative: 0
Basophils Relative: 1
Eosinophils Absolute: 0.1
Eosinophils Absolute: 0
Eosinophils Relative: 1
Lymphocytes Relative: 18
Lymphocytes Relative: 4 — ABNORMAL LOW
Lymphs Abs: 1.5
Monocytes Absolute: 0.4
Neutro Abs: 7.9 — ABNORMAL HIGH
Neutrophils Relative %: 77
Neutrophils Relative %: 93 — ABNORMAL HIGH

## 2010-12-25 LAB — URINE CULTURE: Colony Count: 10000

## 2010-12-25 LAB — WOUND CULTURE

## 2010-12-25 LAB — IRON AND TIBC
Saturation Ratios: 17 — ABNORMAL LOW
TIBC: 208 — ABNORMAL LOW
UIBC: 173

## 2010-12-25 LAB — TYPE AND SCREEN: ABO/RH(D): O POS

## 2010-12-25 LAB — URINE MICROSCOPIC-ADD ON

## 2010-12-25 LAB — ANAEROBIC CULTURE: Gram Stain: NONE SEEN

## 2010-12-25 LAB — VANCOMYCIN, RANDOM: Vancomycin Rm: 18.3

## 2010-12-25 LAB — POCT I-STAT 7, (LYTES, BLD GAS, ICA,H+H)
Acid-Base Excess: 3 — ABNORMAL HIGH
Calcium, Ion: 1.07 — ABNORMAL LOW
HCT: 31 — ABNORMAL LOW
O2 Saturation: 100
Operator id: 156951
Sodium: 135
pO2, Arterial: 444 — ABNORMAL HIGH

## 2010-12-25 LAB — TSH: TSH: 0.839

## 2010-12-25 LAB — HEMOGLOBIN A1C: Hgb A1c MFr Bld: 7.7 — ABNORMAL HIGH

## 2010-12-25 LAB — POTASSIUM: Potassium: 4.7

## 2010-12-25 LAB — VANCOMYCIN, TROUGH: Vancomycin Tr: 6.5

## 2010-12-25 LAB — SEDIMENTATION RATE: Sed Rate: 119 — ABNORMAL HIGH

## 2010-12-26 LAB — I-STAT 8, (EC8 V) (CONVERTED LAB)
Acid-Base Excess: 1
Bicarbonate: 25.7 — ABNORMAL HIGH
Operator id: 270961
Sodium: 136
TCO2: 27
pH, Ven: 7.408 — ABNORMAL HIGH

## 2011-03-14 ENCOUNTER — Encounter: Payer: Self-pay | Admitting: Emergency Medicine

## 2011-03-14 ENCOUNTER — Inpatient Hospital Stay (HOSPITAL_COMMUNITY)
Admission: EM | Admit: 2011-03-14 | Discharge: 2011-03-19 | DRG: 389 | Disposition: A | Discharge status: Home / Self Care | Payer: Medicare Other | Attending: Internal Medicine | Admitting: Internal Medicine

## 2011-03-14 DIAGNOSIS — IMO0002 Reserved for concepts with insufficient information to code with codable children: Secondary | ICD-10-CM | POA: Diagnosis present

## 2011-03-14 DIAGNOSIS — K9409 Other complications of colostomy: Secondary | ICD-10-CM

## 2011-03-14 DIAGNOSIS — R6883 Chills (without fever): Secondary | ICD-10-CM

## 2011-03-14 DIAGNOSIS — K56609 Unspecified intestinal obstruction, unspecified as to partial versus complete obstruction: Principal | ICD-10-CM | POA: Diagnosis present

## 2011-03-14 DIAGNOSIS — E876 Hypokalemia: Secondary | ICD-10-CM | POA: Diagnosis present

## 2011-03-14 DIAGNOSIS — E119 Type 2 diabetes mellitus without complications: Secondary | ICD-10-CM | POA: Diagnosis present

## 2011-03-14 DIAGNOSIS — K8020 Calculus of gallbladder without cholecystitis without obstruction: Secondary | ICD-10-CM | POA: Diagnosis present

## 2011-03-14 DIAGNOSIS — I10 Essential (primary) hypertension: Secondary | ICD-10-CM | POA: Diagnosis present

## 2011-03-14 DIAGNOSIS — M519 Unspecified thoracic, thoracolumbar and lumbosacral intervertebral disc disorder: Secondary | ICD-10-CM

## 2011-03-14 DIAGNOSIS — M869 Osteomyelitis, unspecified: Secondary | ICD-10-CM

## 2011-03-14 DIAGNOSIS — D508 Other iron deficiency anemias: Secondary | ICD-10-CM

## 2011-03-14 DIAGNOSIS — Z933 Colostomy status: Secondary | ICD-10-CM

## 2011-03-14 MED ORDER — ONDANSETRON HCL 4 MG/2ML IJ SOLN
4.0000 mg | Freq: Once | INTRAMUSCULAR | Status: AC
  Administered 2011-03-15: 4 mg via INTRAVENOUS
  Filled 2011-03-14: qty 2

## 2011-03-14 MED ORDER — FENTANYL CITRATE 0.05 MG/ML IJ SOLN
25.0000 ug | Freq: Once | INTRAMUSCULAR | Status: AC
  Administered 2011-03-15: 25 ug via INTRAVENOUS
  Filled 2011-03-14: qty 2

## 2011-03-15 ENCOUNTER — Inpatient Hospital Stay (HOSPITAL_COMMUNITY): Payer: Medicare Other

## 2011-03-15 ENCOUNTER — Emergency Department (HOSPITAL_COMMUNITY): Payer: Medicare Other

## 2011-03-15 ENCOUNTER — Encounter (HOSPITAL_COMMUNITY): Payer: Self-pay | Admitting: Radiology

## 2011-03-15 ENCOUNTER — Other Ambulatory Visit: Payer: Self-pay

## 2011-03-15 DIAGNOSIS — K56609 Unspecified intestinal obstruction, unspecified as to partial versus complete obstruction: Secondary | ICD-10-CM

## 2011-03-15 DIAGNOSIS — IMO0002 Reserved for concepts with insufficient information to code with codable children: Secondary | ICD-10-CM

## 2011-03-15 DIAGNOSIS — K9409 Other complications of colostomy: Secondary | ICD-10-CM

## 2011-03-15 DIAGNOSIS — K8020 Calculus of gallbladder without cholecystitis without obstruction: Secondary | ICD-10-CM

## 2011-03-15 LAB — GLUCOSE, CAPILLARY
Glucose-Capillary: 90 mg/dL (ref 70–99)
Glucose-Capillary: 154 mg/dL — ABNORMAL HIGH (ref 70–99)

## 2011-03-15 LAB — URINALYSIS, ROUTINE W REFLEX MICROSCOPIC
Bilirubin Urine: NEGATIVE
Ketones, ur: NEGATIVE mg/dL
Specific Gravity, Urine: 1.025 (ref 1.005–1.030)
pH: 6 (ref 5.0–8.0)

## 2011-03-15 LAB — CBC
Hemoglobin: 14 g/dL (ref 12.0–15.0)
MCH: 29.9 pg (ref 26.0–34.0)
Platelets: 167 10*3/uL (ref 150–400)
RBC: 4.68 MIL/uL (ref 3.87–5.11)
WBC: 8.5 10*3/uL (ref 4.0–10.5)

## 2011-03-15 LAB — DIFFERENTIAL
Eosinophils Absolute: 0.1 10*3/uL (ref 0.0–0.7)
Lymphs Abs: 0.8 10*3/uL (ref 0.7–4.0)
Monocytes Relative: 6 % (ref 3–12)
Neutro Abs: 7.2 10*3/uL (ref 1.7–7.7)
Neutrophils Relative %: 84 % — ABNORMAL HIGH (ref 43–77)

## 2011-03-15 LAB — URINE MICROSCOPIC-ADD ON

## 2011-03-15 LAB — COMPREHENSIVE METABOLIC PANEL
ALT: 12 U/L (ref 0–35)
Albumin: 3.3 g/dL — ABNORMAL LOW (ref 3.5–5.2)
Alkaline Phosphatase: 78 U/L (ref 39–117)
BUN: 12 mg/dL (ref 6–23)
Chloride: 104 mEq/L (ref 96–112)
Glucose, Bld: 172 mg/dL — ABNORMAL HIGH (ref 70–99)
Potassium: 5.6 mEq/L — ABNORMAL HIGH (ref 3.5–5.1)
Sodium: 138 mEq/L (ref 135–145)
Total Bilirubin: 0.3 mg/dL (ref 0.3–1.2)
Total Protein: 7.3 g/dL (ref 6.0–8.3)

## 2011-03-15 LAB — HEMOGLOBIN A1C: Hgb A1c MFr Bld: 6 % — ABNORMAL HIGH (ref <5.7)

## 2011-03-15 MED ORDER — SODIUM CHLORIDE 0.9 % IV SOLN
INTRAVENOUS | Status: DC
  Administered 2011-03-15 – 2011-03-18 (×4): via INTRAVENOUS

## 2011-03-15 MED ORDER — ZOLPIDEM TARTRATE 5 MG PO TABS: 5.0000 mg | ORAL_TABLET | Freq: Every evening | ORAL | Status: DC | PRN

## 2011-03-15 MED ORDER — HYDROMORPHONE HCL PF 1 MG/ML IJ SOLN: 0.5000 mg | INTRAMUSCULAR | Status: DC | PRN

## 2011-03-15 MED ORDER — DEXTROSE 50 % IV SOLN
INTRAVENOUS | Status: AC
  Administered 2011-03-15: 25 mL
  Filled 2011-03-15: qty 50

## 2011-03-15 MED ORDER — IOHEXOL 300 MG/ML  SOLN: 20.0000 mL | INTRAMUSCULAR | Status: AC

## 2011-03-15 MED ORDER — INSULIN ASPART 100 UNIT/ML ~~LOC~~ SOLN
0.0000 [IU] | SUBCUTANEOUS | Status: DC
  Administered 2011-03-16: 1 [IU] via SUBCUTANEOUS
  Administered 2011-03-17: 2 [IU] via SUBCUTANEOUS
  Administered 2011-03-18 (×3): 1 [IU] via SUBCUTANEOUS
  Administered 2011-03-18: 3 [IU] via SUBCUTANEOUS
  Administered 2011-03-19: 2 [IU] via SUBCUTANEOUS
  Administered 2011-03-19 (×2): 1 [IU] via SUBCUTANEOUS
  Filled 2011-03-15: qty 3

## 2011-03-15 MED ORDER — ONDANSETRON HCL 4 MG/2ML IJ SOLN: 4.0000 mg | Freq: Four times a day (QID) | INTRAMUSCULAR | Status: DC | PRN

## 2011-03-15 MED ORDER — ACETAMINOPHEN 650 MG RE SUPP: 650.0000 mg | Freq: Four times a day (QID) | RECTAL | Status: DC | PRN

## 2011-03-15 MED ORDER — ENOXAPARIN SODIUM 40 MG/0.4ML ~~LOC~~ SOLN
40.0000 mg | SUBCUTANEOUS | Status: DC
  Administered 2011-03-15 – 2011-03-19 (×5): 40 mg via SUBCUTANEOUS
  Filled 2011-03-15 (×6): qty 0.4

## 2011-03-15 MED ORDER — ONDANSETRON HCL 4 MG PO TABS: 4.0000 mg | ORAL_TABLET | Freq: Four times a day (QID) | ORAL | Status: DC | PRN

## 2011-03-15 MED ORDER — SODIUM CHLORIDE 0.9 % IV SOLN
INTRAVENOUS | Status: DC
  Administered 2011-03-15: 05:00:00 via INTRAVENOUS

## 2011-03-15 MED ORDER — IOHEXOL 300 MG/ML  SOLN
80.0000 mL | Freq: Once | INTRAMUSCULAR | Status: AC | PRN
  Administered 2011-03-15: 80 mL via INTRAVENOUS

## 2011-03-15 MED ORDER — ACETAMINOPHEN 325 MG PO TABS: 650.0000 mg | ORAL_TABLET | Freq: Four times a day (QID) | ORAL | Status: DC | PRN

## 2011-03-15 NOTE — ED Notes (Signed)
Admitting MD at bedside.

## 2011-03-15 NOTE — ED Provider Notes (Signed)
History     CSN: [id number]  Arrival date & time 03/14/11  2250   First MD Initiated Contact with Patient 03/14/11 2300      Chief Complaint  Patient presents with  . Abdominal Pain  . Hypertension  . Nausea    (Consider location/radiation/quality/duration/timing/severity/associated sxs/prior treatment) Patient is a 77 y.o. female presenting with abdominal pain and hypertension. The history is provided by the patient.  Abdominal Pain The primary symptoms of the illness include abdominal pain and nausea. The primary symptoms of the illness do not include fever, shortness of breath or dysuria. The current episode started 6 to 12 hours ago. The onset of the illness was gradual. The problem has been gradually worsening.  Associated with: Nothing. The patient has not had a change in bowel habit. Risk factors for an acute abdominal problem include a history of abdominal surgery. Symptoms associated with the illness do not include chills or back pain. Associated symptoms comments: Nausea and vomiting - multiple episodes. Significant associated medical issues include diabetes.  Hypertension Associated symptoms include abdominal pain. Pertinent negatives include no headaches and no shortness of breath.   At home tonight developed pain across her abdomen and all over. She then developed associated nausea vomiting described as digested food and non-bloody non-bilious. Patient has colostomy bag from surgery about 20 years ago. She is unable to tell me why she had the surgery, she denies history of cancer but believes it may have been related to infection. No dark or tarry stools. Pain worse with palpation her abdomen but not alleviated by anything. Moderate in severity. Duration constant since onset. No sick contacts or recent illness otherwise. Past Medical History  Diagnosis Date  . Diabetes mellitus     History reviewed. No pertinent past surgical history.  No family history on  file.  History  Substance Use Topics  . Smoking status: Never Smoker   . Smokeless tobacco: Not on file  . Alcohol Use: No    OB History    Grav Para Term Preterm Abortions TAB SAB Ect Mult Living                  Review of Systems  Constitutional: Negative for fever and chills.  HENT: Negative for neck pain and neck stiffness.   Eyes: Negative for pain.  Respiratory: Negative for shortness of breath.   Gastrointestinal: Positive for nausea and abdominal pain.  Genitourinary: Negative for dysuria and flank pain.  Musculoskeletal: Negative for back pain.  Skin: Negative for rash.  Neurological: Negative for headaches.  All other systems reviewed and are negative.    Allergies  Review of patient's allergies indicates no known allergies.  Home Medications   Current Outpatient Rx  Name Route Sig Dispense Refill  . ALENDRONATE SODIUM 70 MG PO TABS Oral Take 70 mg by mouth every 7 (seven) days. Take with a full glass of water on an empty stomach.     Marland Kitchen GLIPIZIDE ER 5 MG PO TB24 Oral Take 5 mg by mouth daily.      Marland Kitchen PROBENECID 500 MG PO TABS Oral Take 250 mg by mouth daily.       BP 126/72  Pulse 67  Temp(Src) 98.1 F (36.7 C) (Oral)  Resp 16  SpO2 94%  Physical Exam  Constitutional: She is oriented to person, place, and time. She appears well-developed and well-nourished.  HENT:  Head: Normocephalic and atraumatic.  Eyes: Conjunctivae and EOM are normal. Pupils are equal, round, and reactive  to light.  Neck: Trachea normal. Neck supple. No thyromegaly present.  Cardiovascular: Normal rate, regular rhythm, S1 normal, S2 normal and normal pulses.     No systolic murmur is present   No diastolic murmur is present  Pulses:      Radial pulses are 2+ on the right side, and 2+ on the left side.  Pulmonary/Chest: Effort normal and breath sounds normal. She has no wheezes. She has no rhonchi. She has no rales. She exhibits no tenderness.  Abdominal: Soft. Normal  appearance and bowel sounds are normal. There is no CVA tenderness and negative Murphy's sign.       Mild diffuse tenderness without rebound or guarding. Ostomy in place  Musculoskeletal:       BLE:s Calves nontender, no cords or erythema, negative Homans sign  Neurological: She is alert and oriented to person, place, and time. She has normal strength. No cranial nerve deficit or sensory deficit. GCS eye subscore is 4. GCS verbal subscore is 5. GCS motor subscore is 6.  Skin: Skin is warm and dry. No rash noted. She is not diaphoretic.  Psychiatric: Her speech is normal.       Cooperative and appropriate    ED Course  Procedures (including critical care time)  Labs Reviewed  DIFFERENTIAL - Abnormal; Notable for the following:    Neutrophils Relative 84 (*)    Lymphocytes Relative 9 (*)    All other components within normal limits  COMPREHENSIVE METABOLIC PANEL - Abnormal; Notable for the following:    Potassium 5.6 (*)    Glucose, Bld 172 (*)    Albumin 3.3 (*)    GFR calc non Af Amer 81 (*)    All other components within normal limits  URINALYSIS, ROUTINE W REFLEX MICROSCOPIC - Abnormal; Notable for the following:    Color, Urine STRAW (*)    Hgb urine dipstick SMALL (*)    All other components within normal limits  CBC  LIPASE, BLOOD  URINE MICROSCOPIC-ADD ON  POTASSIUM   Ct Abdomen Pelvis W Contrast  03/15/2011  *RADIOLOGY REPORT*  Clinical Data: Lower abdominal pain, nausea vomiting and diarrhea  CT ABDOMEN AND PELVIS WITH CONTRAST  Technique:  Multidetector CT imaging of the abdomen and pelvis was performed following the standard protocol during bolus administration of intravenous contrast.  Contrast: 64mL OMNIPAQUE IOHEXOL 300 MG/ML IV SOLN  Comparison: None.  Findings: Atelectasis in the lung bases.  Small sliver of fluid around the liver and extending along the pericolic gutters to the pelvis with low density, consistent with ascites.  The liver and spleen are unremarkable.   Stones in the dependent portion of the gallbladder.  Bile ducts are not dilated but there is mild dilatation of the pancreatic duct.  No obstructing lesion or stone is visualized.  Consider MRCP for further evaluation.  The pancreas demonstrates normal parenchymal enhancement without peripancreatic fluid or infiltration.  No adrenal gland nodules.  Kidneys are normal.  Normal caliber thoracic aorta with mild calcification.  No retroperitoneal lymphadenopathy.  The stomach is unremarkable. There is a left lower abdominal wall hernia which might represent a spigelian hernia or peristomal hernia.  This contains multiple loops of small bowel.  There is mild proximal small bowel dilatation suggesting obstruction. Distal small bowel is decompressed.  Contrast material does extend through this area into the colon suggesting that this is not represent complete obstruction.  Focal small bowel wall thickening is suggested.  This could represent ischemia or inflammatory process.  No free air in the abdomen.  No pneumatosis.  Pelvis:  The prostate gland is mildly enlarged.  The bladder wall is not thickened.  No loculated pelvic fluid collections.  No inflammatory changes in the right lower quadrant or sigmoid colon. Small fat containing inguinal hernias.  Postoperative and degenerative changes in the lumbar spine.  IMPRESSION: Left lower quadrant abdominal wall hernia representing either a peristomal or spigelian hernia there is proximal small bowel dilatation with contrast flowing distally, suggesting partial small bowel obstruction.  Focal bowel wall thickening.  Small amount of abdominal ascites.  Cholelithiasis.  Original Report Authenticated By: Neale Burly, M.D.    Date: 03/15/2011  Rate: 78  Rhythm: normal sinus rhythm  QRS Axis: left  Intervals: normal  ST/T Wave abnormalities: nonspecific ST/T changes  Conduction Disutrbances:none  Narrative Interpretation: Artifact and baseline wander present  Old  EKG Reviewed: unchanged    Diagnosis partial small bowel obstruction.  Recheck at 5:15 AM, nausea improving still some mild bowel discomfort but declines repeat pain medications. Medicine consultation for admission. Case discussed as above Dr. Arnoldo Morale. Plan triad admit   MDM   Abdominal pain and vomiting workup as above. Labs and CT scan. Repeat potassium sent for hyperkalemia likely due to hemolysis. An EKG was obtained and reviewed without any changes to suggest hyperkalemic state.        Teressa Lower, MD 03/15/11 [phone]

## 2011-03-15 NOTE — ED Notes (Signed)
Calling report now.

## 2011-03-15 NOTE — Consult Note (Signed)
NAME: [name]                                                                                      DOB: 02-06-1934 DATE: 03/14/2011               MRN: [id number]   CC:  Chief Complaint  Patient presents with  . Abdominal Pain  . Hypertension  . Nausea    HPI:  [name] is a 77 y.o.  female who was referred  by Dr. Melrose Nakayama for evaluation of abdominal pain nausea and vomiting. She presented to our emergency room early this morning with severe abdominal pain that began about 30 minutes prior to arrival in the emergency department. She was nauseated and had vomiting. She continued to have stool output via her colostomy. Since admission the pain has resolved. She's no longer nauseated. She continues to have stool via her ostomy.  She says that her pain was primarily mid and lower abdomen. It was not in the right upper quadrant. She does note that she has had issues with indigestion and mild gas and bloating.  She thinks her colostomy was done about 12-15 years ago for a perforation of her colon. She does not have any more details. She does not know who her surgeon was but it was done in [place]. She is unclear why she never had the colostomy reversed. She is known that she has had a parastomal hernia for quite a while but has never given her any problems.  As noted at the time it seen her she has improved with her pain  PMH:  has a past medical history of Diabetes mellitus.  PSH:   has no past surgical history on file.  Social Hx:  History   Social History  . Marital Status: Single    Spouse Name: N/A    Number of Children: N/A  . Years of Education: N/A   Occupational History  . Not on file.   Social History Main Topics  . Smoking status: Never Smoker   . Smokeless tobacco: Not on file  . Alcohol Use: No  . Drug Use:   . Sexually Active:    Other Topics Concern  . Not on file   Social History Narrative  . No narrative on file     ALLERGIES:  No Known  Allergies  MEDICATIONS: Current facility-administered medications:0.9 %  sodium chloride infusion, , Intravenous, Continuous, Teressa Lower, MD, Last Rate: 125 mL/hr at 03/15/11 0439;  0.9 %  sodium chloride infusion, , Intravenous, Continuous, Theressa Millard, MD;  acetaminophen (TYLENOL) suppository 650 mg, 650 mg, Rectal, Q6H PRN, Theressa Millard, MD;  acetaminophen (TYLENOL) tablet 650 mg, 650 mg, Oral, Q6H PRN, Theressa Millard, MD enoxaparin (LOVENOX) injection 40 mg, 40 mg, Subcutaneous, Q24H, Harvette Evonnie Dawes, MD;  fentaNYL (SUBLIMAZE) injection 25 mcg, 25 mcg, Intravenous, Once, Teressa Lower, MD, 25 mcg at 03/15/11 0100;  HYDROmorphone (DILAUDID) injection 0.5-1 mg, 0.5-1 mg, Intravenous, Q3H PRN, Theressa Millard, MD;  insulin aspart (novoLOG) injection 0-9 Units, 0-9 Units, Subcutaneous, Q4H, Harvette C Jenkins, MD iohexol (OMNIPAQUE) 300 MG/ML solution 20 mL,  20 mL, Oral, Q1 Hr x 2, Teressa Lower, MD;  iohexol (OMNIPAQUE) 300 MG/ML solution 80 mL, 80 mL, Intravenous, Once PRN, Medication Radiologist, 80 mL at 03/15/11 0323;  ondansetron [facility]) injection 4 mg, 4 mg, Intravenous, Once, Teressa Lower, MD, 4 mg at 03/15/11 0058;  ondansetron (ZOFRAN) injection 4 mg, 4 mg, Intravenous, Q6H PRN, Theressa Millard, MD ondansetron (ZOFRAN) tablet 4 mg, 4 mg, Oral, Q6H PRN, Theressa Millard, MD;  zolpidem (AMBIEN) tablet 5 mg, 5 mg, Oral, QHS PRN, Theressa Millard, MD  ROS: this is basically negative and has been documented already in the EMR so not redictated here. Of note is that she has had problems with a discitis after surgery as well as some problems with knee fluid. She has been seen by the infectious disease people here for that.  EXAM:   GENERAL:  The patient is alert, oriented, and generally healthy-appearing, NAD. Mood and affect are normal.  HEENT:  The head is normocephalic, the eyes nonicteric, the pupils were round regular and equal. EOMs are normal. Pharynx normal.  Dentition good.  NECK:  The neck is supple and there are no masses or thyromegaly.  LUNGS: Normal respirations and clear to auscultation.  HEART: Regular rhythm, with no murmurs rubs or gallops. Pulses are intact carotid dorsalis pedis and posterior tibial. No significant varicosities are noted.   ABDOMEN: There is a left lower quadrant colostomy which appears to be healthy. There is stool in the colostomy bag. There appears to be a peristomal hernia but it reduces partially. It is soft and nontender. The midline incision is well-healed. There is no other abdominal tenderness. There is no mass or organomegaly noted. There are no other hernias noted. Bowel sounds are present.  EXTREMITIES:  Good range of motion, no edema.   DATA REVIEWED:  I have reviewed her laboratory studies, plain abdominal films and CT scan. She does appear to have a partial delay of passage of contrast on the CT scan this appears to be in the area of the peristomal hernia. However there is no clear-cut obstruction. Incidentally noted are gallstones. There is no free fluid or evidence of other acute process in the abdomen.  IMPRESSION:  Apparent partial small bowel obstruction with improvement clinically Parastomal hernia, chronic Left lower quadrant colostomy from prior surgery Gallstones possibly minimally symptomatic  PLAN:   I think she is going to resolve his partial small bowel obstruction completely. We will check a gallbladder ultrasound to see if there's any evidence of acute cholecystitis but I doubt this given her current benign exam. We will try to obtain some old records to see why the colostomy was done and see if there is any potential for taking it down and reestablishing intestinal continuity. Her peristomal hernia could be repaired at that time.  Teriann Livingood J 03/15/2011  CC: Dr Maudry Mayhew

## 2011-03-15 NOTE — ED Notes (Signed)
Waiting for admitting MD to finish with patient before calling report.

## 2011-03-15 NOTE — ED Notes (Signed)
Patient currently resting quietly in bed; no respiratory or acute distress noted.  Patient updated on plan of care; informed p

## 2011-03-15 NOTE — ED Notes (Signed)
Patient given oral contrast/water by CT; CT informed patient that she has two hours to drink two cups.

## 2011-03-15 NOTE — Progress Notes (Signed)
Subjective: 77 year old patient who was admitted with a brief history of abdominal pain nausea and vomiting. She was admitted with suspected partial small bowel obstruction and since her admission has clinically improved. Presently she has no pain or nausea. CT abdominal scan revealed gallstones and the patient has just returned from an abdominal ultrasound to further evaluate the gallbladder.  Objective: Vital signs in last 24 hours: Temp:  [97.9 F (36.6 C)-99.4 F (37.4 C)] 99.4 F (37.4 C) (12/30 0717) Pulse Rate:  [60-81] 60  (12/30 0717) Resp:  [16-18] 18  (12/30 0717) BP: (126-151)/(71-81) 146/81 mmHg (12/30 0717) SpO2:  [94 %-98 %] 98 % (12/30 0717) Weight change:     Intake/Output from previous day: 12/29 0701 - 12/30 0700 In: 400 [I.V.:400] Out: -  Intake/Output this shift:    Gen. Examination-  patient is alert comfortable in no distress HEENT exam- unremarkable anicteric oropharynx appears well hydrated Chest clear Cardiovascular exam no tachycardia Abdomen soft flat nontender a midline surgical scar noted as well as a left-sided colostomy. The colostomy bag was filled with liquid stool. Bowel sounds were slightly diminished but present   Lab Results:  Basename 03/15/11 0003  WBC 8.5  HGB 14.0  HCT 42.3  PLT 167   BMET  Basename 03/15/11 0529 03/15/11 0003  NA -- 138  K 3.3* 5.6*  CL -- 104  CO2 -- 27  GLUCOSE -- 172*  BUN -- 12  CREATININE -- 0.74  CALCIUM -- 8.9    Studies/Results: Ct Abdomen Pelvis W Contrast  03/15/2011  **ADDENDUM** CREATED: 03/15/2011 06:30:58  Correction:  This female patient does not have a  prostate gland. The interpretation of the pelvis should read atrophic uterus without abnormal adnexal mass.  The left lower quadrant hernia is confirmed to represent a peristomal hernia with a left lower quadrant descending colostomy and decompressed rectal stump.  **END ADDENDUM** SIGNED BY: Neale Burly, M.D.   03/15/2011   *RADIOLOGY REPORT*  Clinical Data: Lower abdominal pain, nausea vomiting and diarrhea  CT ABDOMEN AND PELVIS WITH CONTRAST  Technique:  Multidetector CT imaging of the abdomen and pelvis was performed following the standard protocol during bolus administration of intravenous contrast.  Contrast: 74mL OMNIPAQUE IOHEXOL 300 MG/ML IV SOLN  Comparison: None.  Findings: Atelectasis in the lung bases.  Small sliver of fluid around the liver and extending along the pericolic gutters to the pelvis with low density, consistent with ascites.  The liver and spleen are unremarkable.  Stones in the dependent portion of the gallbladder.  Bile ducts are not dilated but there is mild dilatation of the pancreatic duct.  No obstructing lesion or stone is visualized.  Consider MRCP for further evaluation.  The pancreas demonstrates normal parenchymal enhancement without peripancreatic fluid or infiltration.  No adrenal gland nodules.  Kidneys are normal.  Normal caliber thoracic aorta with mild calcification.  No retroperitoneal lymphadenopathy.  The stomach is unremarkable. There is a left lower abdominal wall hernia which might represent a spigelian hernia or peristomal hernia.  This contains multiple loops of small bowel.  There is mild proximal small bowel dilatation suggesting obstruction. Distal small bowel is decompressed.  Contrast material does extend through this area into the colon suggesting that this is not represent complete obstruction.  Focal small bowel wall thickening is suggested.  This could represent ischemia or inflammatory process.  No free air in the abdomen.  No pneumatosis.  Pelvis:  The prostate gland is mildly enlarged.  The bladder  wall is not thickened.  No loculated pelvic fluid collections.  No inflammatory changes in the right lower quadrant or sigmoid colon. Small fat containing inguinal hernias.  Postoperative and degenerative changes in the lumbar spine.  IMPRESSION: Left lower quadrant abdominal  wall hernia representing either a peristomal or spigelian hernia there is proximal small bowel dilatation with contrast flowing distally, suggesting partial small bowel obstruction.  Focal bowel wall thickening.  Small amount of abdominal ascites.  Cholelithiasis.  Original Report Authenticated By: Neale Burly, M.D.    Medications: I have reviewed the patient's current medications.  Assessment/Plan: Abdominal pain- now resolved and probably secondary to partial small bowel obstruction. The patient remains n.p.o; if she remains stable we'll challenge with a clear liquid diet in the morning;  followup CBC and chemistries in the morning Gallstones. Will review gallbladder ultrasound  LOS: 1 day   Nyoka Cowden 03/15/2011, 3:00 PM

## 2011-03-15 NOTE — ED Notes (Signed)
PT states  unable to provide urine sample at this time

## 2011-03-15 NOTE — ED Notes (Signed)
Patient currently resting quietly in bed; no respiratory or acute distress noted.  Patient updated on plan of care; informed patient that we are waiting until [name] finishes contrast cups before going to CT scan.  Patient has no other questions or concerns at this time.  Informed patient that we need a urine sample; patient states that [name] is unable to go at this time.  Will continue to monitor.

## 2011-03-15 NOTE — ED Notes (Signed)
RN at bedside attempting IV insertion.

## 2011-03-15 NOTE — ED Notes (Signed)
Patient currently asleep in bed; no respiratory or acute distress noted.  Will continue to monitor.

## 2011-03-15 NOTE — ED Notes (Signed)
Patient currently resting quietly in bed; no respiratory or acute distress noted.  CT notified that patient is finished with CT contrast/water cups.  Patient has no questions or concerns at this time.  Will continue to monitor.

## 2011-03-15 NOTE — ED Notes (Addendum)
Patient currently sitting up in bed; no respiratory or acute distress noted.  Nurse tech performed in-and-out cath to collect urine sample.  Patient has no questions or concerns at this time; updated patient on plan of care; informed patient that EDP has made consult to internal medicine.  Will continue to monitor.

## 2011-03-15 NOTE — ED Notes (Signed)
Patient currently resting quietly in bed; no respiratory or acute distress noted.  Patient updated on plan of care; informed patient that a bed is ready and report is being called.  Patient has no other questions or concerns at this time.  Will continue to monitor.

## 2011-03-15 NOTE — H&P (Signed)
DATE OF ADMISSION:  03/15/2011  PCP:  Dr. Seward Carol     Chief Complaint: ABD Pain   HPI: [name] is an 77 y.o. female who presented to the ED with complaints of severe generalized abdominal pain that started 30 minutes prior to her arrival at the ED.  She reports having nausea and vomiting.  She has a colostomy and she denies having any change in the output from her colostomy.   She denies passing any bloody stools, and she also denies having any fevers or chills.    In the ED a CT scan was performed which revealed a partial small bowel obstruction as well as a ventral hernia.   Patient was referred for medical admission.    Past Medical History  Diagnosis Date  . 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 Authorizing Provider  alendronate (FOSAMAX) 70 MG tablet Take 70 mg by mouth every 7 (seven) days. Take with a full glass of water on an empty stomach.    Yes Historical Provider, MD  glipiZIDE (GLUCOTROL XL) 5 MG 24 hr tablet Take 5 mg by mouth daily.     Yes Historical Provider, MD  probenecid (BENEMID) 500 MG tablet Take 250 mg by mouth daily.    Yes Historical Provider, MD    Allergies:  No Known Allergies  Social History:   reports that she has never smoked. She does not have any smokeless tobacco history on file. She reports that she does not drink alcohol. Her drug history not on file.  Family History: Strong family history of HTN in all of her siblings, Diabetes in 2 Sisters and 1 brother, Cancer in 1 brother  Does not know the type  Review of Systems:  The patient denies anorexia, fever, weight loss, vision loss, decreased hearing, hoarseness, chest pain, syncope, dyspnea on exertion, peripheral edema, balance deficits, hemoptysis, abdominal pain, melena, hematochezia, severe indigestion/heartburn, hematuria,  incontinence, genital sores, muscle weakness, suspicious skin lesions, transient blindness, difficulty walking, depression, unusual weight change, abnormal bleeding, enlarged lymph nodes, angioedema, and breast masses.   Physical Exam:  GEN:  Pleasant thin elderly African American female examined  and in no acute distress; cooperative with exam Filed Vitals:   03/14/11 2357 03/15/11 0200 03/15/11 0400  BP: 151/75 140/71 126/72  Pulse: 76 81 67  Temp: 98.3 F (36.8 C)  98.1 F (36.7 C)  TempSrc:   Oral  Resp: 18 16 16   SpO2: 96% 97% 94%   Blood pressure 126/72, pulse 67, temperature 98.1 F (36.7 C), temperature source Oral, resp. rate 16, SpO2 94.00%. PSYCH: She is alert and oriented x4; does not appear anxious does not appear depressed; affect is normal HEENT: Normocephalic and Atraumatic, Mucous membranes pink; PERRLA; EOM intact; Fundi:  Benign;  No scleral icterus, Nares: Patent, Oropharynx: Clear,Fair Dentition, Neck:  FROM, no cervical lymphadenopathy nor thyromegaly or carotid bruit; no JVD; Breasts:: Not examined CHEST WALL: No tenderness CHEST: Normal respiration, clear to auscultation bilaterally HEART: Regular rate and rhythm; no murmurs rubs or gallops BACK: No kyphosis or scoliosis; no CVA tenderness ABDOMEN: Positive Bowel Sounds, Scaphoid, soft non-tender; midline surgical scar,no masses, no organomegaly.  Colostomy present secure and the colostomy bag is half full of brown stool,  Rectal Exam: Not done EXTREMITIES: No cyanosis, clubbing  or edema; no ulcerations. Genitalia: not examined PULSES: 2+ and symmetric SKIN: Normal hydration no rash or ulceration CNS: Cranial nerves 2-12 grossly intact no focal neurologic deficit   Labs & Imaging Results for orders placed during the hospital encounter of 03/14/11 (from the past 48 hour(s))  CBC     Status: Normal   Collection Time   03/15/11 12:03 AM      Component Value Range Comment   WBC 8.5  4.0 - 10.5 (K/uL)     RBC 4.68  3.87 - 5.11 (MIL/uL)    Hemoglobin 14.0  12.0 - 15.0 (g/dL)    HCT 42.3  36.0 - 46.0 (%)    MCV 90.4  78.0 - 100.0 (fL)    MCH 29.9  26.0 - 34.0 (pg)    MCHC 33.1  30.0 - 36.0 (g/dL)    RDW 13.7  11.5 - 15.5 (%)    Platelets 167  150 - 400 (K/uL)   DIFFERENTIAL     Status: Abnormal   Collection Time   03/15/11 12:03 AM      Component Value Range Comment   Neutrophils Relative 84 (*) 43 - 77 (%)    Neutro Abs 7.2  1.7 - 7.7 (K/uL)    Lymphocytes Relative 9 (*) 12 - 46 (%)    Lymphs Abs 0.8  0.7 - 4.0 (K/uL)    Monocytes Relative 6  3 - 12 (%)    Monocytes Absolute 0.5  0.1 - 1.0 (K/uL)    Eosinophils Relative 1  0 - 5 (%)    Eosinophils Absolute 0.1  0.0 - 0.7 (K/uL)    Basophils Relative 0  0 - 1 (%)    Basophils Absolute 0.0  0.0 - 0.1 (K/uL)   COMPREHENSIVE METABOLIC PANEL     Status: Abnormal   Collection Time   03/15/11 12:03 AM      Component Value Range Comment   Sodium 138  135 - 145 (mEq/L)    Potassium 5.6 (*) 3.5 - 5.1 (mEq/L)    Chloride 104  96 - 112 (mEq/L)    CO2 27  19 - 32 (mEq/L)    Glucose, Bld 172 (*) 70 - 99 (mg/dL)    BUN 12  6 - 23 (mg/dL)    Creatinine, Ser 0.74  0.50 - 1.10 (mg/dL)    Calcium 8.9  8.4 - 10.5 (mg/dL)    Total Protein 7.3  6.0 - 8.3 (g/dL)    Albumin 3.3 (*) 3.5 - 5.2 (g/dL)    AST 31  0 - 37 (U/L) HEMOLYSIS AT THIS LEVEL MAY AFFECT RESULT   ALT 12  0 - 35 (U/L) HEMOLYSIS AT THIS LEVEL MAY AFFECT RESULT   Alkaline Phosphatase 78  39 - 117 (U/L) HEMOLYSIS AT THIS LEVEL MAY AFFECT RESULT   Total Bilirubin 0.3  0.3 - 1.2 (mg/dL)    GFR calc non Af Amer 81 (*) >90 (mL/min)    GFR calc Af Amer >90  >90 (mL/min)   LIPASE, BLOOD     Status: Normal   Collection Time   03/15/11 12:03 AM      Component Value Range Comment   Lipase 46  11 - 59 (U/L)   URINALYSIS, ROUTINE W REFLEX MICROSCOPIC     Status: Abnormal   Collection Time   03/15/11  5:00 AM      Component Value Range Comment   Color, Urine STRAW (*) YELLOW      APPearance CLEAR  CLEAR  Specific Gravity, Urine 1.025  1.005 - 1.030     pH 6.0  5.0 - 8.0     Glucose, UA NEGATIVE  NEGATIVE (mg/dL)    Hgb urine dipstick SMALL (*) NEGATIVE     Bilirubin Urine NEGATIVE  NEGATIVE     Ketones, ur NEGATIVE  NEGATIVE (mg/dL)    Protein, ur NEGATIVE  NEGATIVE (mg/dL)    Urobilinogen, UA 0.2  0.0 - 1.0 (mg/dL)    Nitrite NEGATIVE  NEGATIVE     Leukocytes, UA NEGATIVE  NEGATIVE    URINE MICROSCOPIC-ADD ON     Status: Normal   Collection Time   03/15/11  5:00 AM      Component Value Range Comment   Squamous Epithelial / LPF RARE  RARE     WBC, UA 0-2  <3 (WBC/hpf)    RBC / HPF 3-6  <3 (RBC/hpf)    Bacteria, UA RARE  RARE     Ct Abdomen Pelvis W Contrast  03/15/2011  *RADIOLOGY REPORT*  Clinical Data: Lower abdominal pain, nausea vomiting and diarrhea  CT ABDOMEN AND PELVIS WITH CONTRAST  Technique:  Multidetector CT imaging of the abdomen and pelvis was performed following the standard protocol during bolus administration of intravenous contrast.  Contrast: 42mL OMNIPAQUE IOHEXOL 300 MG/ML IV SOLN  Comparison: None.  Findings: Atelectasis in the lung bases.  Small sliver of fluid around the liver and extending along the pericolic gutters to the pelvis with low density, consistent with ascites.  The liver and spleen are unremarkable.  Stones in the dependent portion of the gallbladder.  Bile ducts are not dilated but there is mild dilatation of the pancreatic duct.  No obstructing lesion or stone is visualized.  Consider MRCP for further evaluation.  The pancreas demonstrates normal parenchymal enhancement without peripancreatic fluid or infiltration.  No adrenal gland nodules.  Kidneys are normal.  Normal caliber thoracic aorta with mild calcification.  No retroperitoneal lymphadenopathy.  The stomach is unremarkable. There is a left lower abdominal wall hernia which might represent a spigelian hernia or peristomal hernia.  This contains multiple loops of small  bowel.  There is mild proximal small bowel dilatation suggesting obstruction. Distal small bowel is decompressed.  Contrast material does extend through this area into the colon suggesting that this is not represent complete obstruction.  Focal small bowel wall thickening is suggested.  This could represent ischemia or inflammatory process.  No free air in the abdomen.  No pneumatosis.  Pelvis:  The prostate gland is mildly enlarged.  The bladder wall is not thickened.  No loculated pelvic fluid collections.  No inflammatory changes in the right lower quadrant or sigmoid colon. Small fat containing inguinal hernias.  Postoperative and degenerative changes in the lumbar spine.  IMPRESSION: Left lower quadrant abdominal wall hernia representing either a peristomal or spigelian hernia there is proximal small bowel dilatation with contrast flowing distally, suggesting partial small bowel obstruction.  Focal bowel wall thickening.  Small amount of abdominal ascites.  Cholelithiasis.  Original Report Authenticated By: Neale Burly, M.D.   * Radiology asked to correct report----   Assessment: 1. Partial SBO- 2.  ABD Pain 3.  Nausea and Vomiting 4.  Type 2 Diabetes  Plan:    Patient is NPO except for Ice chips and IVFs hve been ordered for maintenance therapy.  Anti-emetics, and Pain control therapy has been ordered as well.      Other plans as per orders.    CODE STATUS:  FULL CODE        Chi Garlow C 03/15/2011, 6:15 AM

## 2011-03-15 NOTE — ED Notes (Addendum)
Patient complaining of abdominal pain, nausea, vomiting, diarrhea, and low back pain that started around 1600.  Patient states that she has had similar symptoms in the past and that the cause has been caused by a virus.  Denies chest pain, shortness of breath, flu-like symptoms, fever, and chills. Rates pain 5/10 on the numerical pain scale; describes pain as "aching" and "sharp"; location of abdominal pain is "generalized".  Upon assessment, abdomen is soft and non-tender to touch.  Bowel sounds present in all four quadrants.  Patient alert and oriented x4; PERRL present.  Will continue to monitor.

## 2011-03-15 NOTE — ED Notes (Signed)
Patient being transported upstairs with nurse tech.

## 2011-03-15 NOTE — ED Notes (Signed)
Lab at bedside collecting blood specimens; tech at bedside collecting EKG.

## 2011-03-16 DIAGNOSIS — K56609 Unspecified intestinal obstruction, unspecified as to partial versus complete obstruction: Secondary | ICD-10-CM

## 2011-03-16 DIAGNOSIS — R109 Unspecified abdominal pain: Secondary | ICD-10-CM

## 2011-03-16 LAB — CBC
Hemoglobin: 11.6 g/dL — ABNORMAL LOW (ref 12.0–15.0)
MCH: 29.6 pg (ref 26.0–34.0)
MCHC: 32.5 g/dL (ref 30.0–36.0)
MCV: 91.1 fL (ref 78.0–100.0)
Platelets: 159 10*3/uL (ref 150–400)
RBC: 3.92 MIL/uL (ref 3.87–5.11)
RDW: 13.9 % (ref 11.5–15.5)

## 2011-03-16 LAB — BASIC METABOLIC PANEL
Chloride: 109 mEq/L (ref 96–112)
GFR calc Af Amer: 80 mL/min — ABNORMAL LOW (ref >90)
GFR calc non Af Amer: 69 mL/min — ABNORMAL LOW (ref >90)
Glucose, Bld: 78 mg/dL (ref 70–99)
Potassium: 2.9 mEq/L — ABNORMAL LOW (ref 3.5–5.1)
Sodium: 142 mEq/L (ref 135–145)

## 2011-03-16 LAB — GLUCOSE, CAPILLARY
Glucose-Capillary: 78 mg/dL (ref 70–99)
Glucose-Capillary: 75 mg/dL (ref 70–99)
Glucose-Capillary: 68 mg/dL — ABNORMAL LOW (ref 70–99)
Glucose-Capillary: 178 mg/dL — ABNORMAL HIGH (ref 70–99)
Glucose-Capillary: 121 mg/dL — ABNORMAL HIGH (ref 70–99)

## 2011-03-16 MED ORDER — POTASSIUM CHLORIDE 20 MEQ/15ML (10%) PO LIQD
40.0000 meq | ORAL | Status: AC
  Administered 2011-03-16 (×2): 40 meq via ORAL
  Filled 2011-03-16 (×2): qty 30

## 2011-03-16 MED ORDER — POTASSIUM CHLORIDE 10 MEQ/100ML IV SOLN
10.0000 meq | INTRAVENOUS | Status: AC
  Administered 2011-03-16 (×2): 10 meq via INTRAVENOUS
  Filled 2011-03-16 (×4): qty 100

## 2011-03-16 MED ORDER — DEXTROSE 50 % IV SOLN: 25.0000 mL | Freq: Once | INTRAVENOUS | Status: AC | PRN

## 2011-03-16 NOTE — Progress Notes (Signed)
CBG: 68  Treatment: Received 15g carbohydrate.   Symptoms:Shaky  Follow-up CBG: Time:1700 CBG Result:178  Possible Reasons for Event: Clear liquid diet.  Comments/MD notified:Currently WNL.    [name], [place]

## 2011-03-16 NOTE — Progress Notes (Signed)
S: Patient feels better. Says she has a "cold." No nausea, no abdominal pain  O: Patient alert and comfortable. Lungs clear. Abdomen is soft and benign. There is stool and air in colostomy bag. The parastomal hernia is soft and does not appear to be an acute problem  Data: GB sono shows gallstones but no evidence of cholecystitis Labs show K of 3.1  Imp: Parastomal hernia, at least partially reducible Partial SBO, improved Gallstones, Asx Hypokalemia  Rec: OK to offer clear liquids and see how tolerated. Needs normal K+ to advance or she will have ileus and not be able to progress. Gallstones are asx and no Rx needed.  At some point consideration might be given to re-establish intestinal continutity and repair the hernia. Will need old records to asses that potential and she will need colonoscopy (including a look at rectum) at some point.

## 2011-03-16 NOTE — Progress Notes (Signed)
CBG: 68 Treatment: D50 45ml  Symptoms: None  Follow-up CBG: Time:2311CBG Result: 154  Possible Reasons for Event: Inadequate meal intake /npo  Comments/MD notified   none  [name]

## 2011-03-16 NOTE — Progress Notes (Signed)
Subjective:  Patient seen and examined ,feeling better and denies any complaints   Objective: Vital signs in last 24 hours: Temp:  [98.7 F (37.1 C)-98.8 F (37.1 C)] 98.8 F (37.1 C) (12/31 1400) Pulse Rate:  [54-88] 56  (12/31 1400) Resp:  [18-20] 18  (12/31 1400) BP: (164-185)/(82-84) 164/82 mmHg (12/31 1400) SpO2:  [95 %-98 %] 96 % (12/31 1400) Weight:  [62.052 kg (136 lb 12.8 oz)] 136 lb 12.8 oz (62.052 kg) (12/31 1500) Weight change:  Last BM Date: 03/15/11  Intake/Output from previous day: 12/30 0701 - 12/31 0700 In: 995 [I.V.:995] Out: -      Physical Exam: General: Alert, awake, oriented x3, in no acute distress. Heart: Regular rate and rhythm, without murmurs, rubs, gallops. Lungs: Clear to auscultation bilaterally. Abdomen: Soft, nontender, nondistended, positive bowel sounds.colostomy bag in place ,parastomal hernia noted  Extremities: No clubbing cyanosis or edema with positive pedal pulses. Neuro: Grossly intact, nonfocal.    Lab Results: Results for orders placed during the hospital encounter of 03/14/11 (from the past 24 hour(s))  GLUCOSE, CAPILLARY     Status: Abnormal   Collection Time   03/15/11 10:18 PM      Component Value Range   Glucose-Capillary 68 (*) 70 - 99 (mg/dL)  GLUCOSE, CAPILLARY     Status: Abnormal   Collection Time   03/15/11 11:11 PM      Component Value Range   Glucose-Capillary 154 (*) 70 - 99 (mg/dL)  GLUCOSE, CAPILLARY     Status: Abnormal   Collection Time   03/16/11 12:09 AM      Component Value Range   Glucose-Capillary 104 (*) 70 - 99 (mg/dL)  GLUCOSE, CAPILLARY     Status: Normal   Collection Time   03/16/11  3:55 AM      Component Value Range   Glucose-Capillary 77  70 - 99 (mg/dL)  BASIC METABOLIC PANEL     Status: Abnormal   Collection Time   03/16/11  6:20 AM      Component Value Range   Sodium 142  135 - 145 (mEq/L)   Potassium 2.9 (*) 3.5 - 5.1 (mEq/L)   Chloride 109  96 - 112 (mEq/L)   CO2 25  19 - 32  (mEq/L)   Glucose, Bld 78  70 - 99 (mg/dL)   BUN 8  6 - 23 (mg/dL)   Creatinine, Ser 0.81  0.50 - 1.10 (mg/dL)   Calcium 8.1 (*) 8.4 - 10.5 (mg/dL)   GFR calc non Af Amer 69 (*) >90 (mL/min)   GFR calc Af Amer 80 (*) >90 (mL/min)  CBC     Status: Abnormal   Collection Time   03/16/11  6:20 AM      Component Value Range   WBC 4.1  4.0 - 10.5 (K/uL)   RBC 3.92  3.87 - 5.11 (MIL/uL)   Hemoglobin 11.6 (*) 12.0 - 15.0 (g/dL)   HCT 35.7 (*) 36.0 - 46.0 (%)   MCV 91.1  78.0 - 100.0 (fL)   MCH 29.6  26.0 - 34.0 (pg)   MCHC 32.5  30.0 - 36.0 (g/dL)   RDW 13.9  11.5 - 15.5 (%)   Platelets 159  150 - 400 (K/uL)  GLUCOSE, CAPILLARY     Status: Normal   Collection Time   03/16/11  6:28 AM      Component Value Range   Glucose-Capillary 78  70 - 99 (mg/dL)  GLUCOSE, CAPILLARY     Status: Normal  Collection Time   03/16/11 11:33 AM      Component Value Range   Glucose-Capillary 75  70 - 99 (mg/dL)  GLUCOSE, CAPILLARY     Status: Abnormal   Collection Time   03/16/11  4:15 PM      Component Value Range   Glucose-Capillary 68 (*) 70 - 99 (mg/dL)   Comment 1 Notify RN    GLUCOSE, CAPILLARY     Status: Abnormal   Collection Time   03/16/11  5:03 PM      Component Value Range   Glucose-Capillary 178 (*) 70 - 99 (mg/dL)    Studies/Results: US Abdomen Complete  03/15/2011  *RADIOLOGY REPORT*  Clinical Data:  Abdominal pain.  Gallstones on CT.  COMPLETE ABDOMINAL ULTRASOUND  Comparison:  CT 03/15/2011.  Findings:  Gallbladder: Small gallstones are present.  There is no gallbladder wall thickening or pericholecystic fluid.  Sonographic Murphy's sign is absent.  Common bile duct:   Normal in caliber without filling defects.  Liver:  Echogenicity is within normal limits.  No focal hepatic abnormalities are identified.  IVC:  Visualized portions appear unremarkable.  Pancreas:  Visualized portions appear unremarkable.  Spleen:  Visualized portions appear unremarkable.  Right Kidney:   The renal  cortical thickness and echogenicity are preserved.  There is no hydronephrosis or focal abnormality. Renal length is 10.2 cm.  Left Kidney:   The renal cortical thickness and echogenicity are preserved.  There is no hydronephrosis or focal abnormality. Renal length is 9.4 cm.  Abdominal aorta:  Visualized portions appear unremarkable. The distal aorta is not well visualized.  A small amount of ascites is noted.  IMPRESSION:  1.  Cholelithiasis.  No evidence of biliary dilatation or cholecystitis. 2.  No other significant findings.  Original Report Authenticated By: Vivia Ewing, M.D.   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 The interpretation of the pelvis should read atrophic uterus without abnormal adnexal mass.  The left lower quadrant hernia is confirmed to represent a peristomal hernia with a left lower quadrant descending colostomy and decompressed rectal stump.  **END ADDENDUM** SIGNED BY: Neale Burly, M.D.   03/15/2011  *RADIOLOGY REPORT*  Clinical Data: Lower abdominal pain, nausea vomiting and diarrhea  CT ABDOMEN AND PELVIS WITH CONTRAST  Technique:  Multidetector CT imaging of the abdomen and pelvis was performed following the standard protocol during bolus administration of intravenous contrast.  Contrast: 29mL OMNIPAQUE IOHEXOL 300 MG/ML IV SOLN  Comparison: None.  Findings: Atelectasis in the lung bases.  Small sliver of fluid around the liver and extending along the pericolic gutters to the pelvis with low density, consistent with ascites.  The liver and spleen are unremarkable.  Stones in the dependent portion of the gallbladder.  Bile ducts are not dilated but there is mild dilatation of the pancreatic duct.  No obstructing lesion or stone is visualized.  Consider MRCP for further evaluation.  The pancreas demonstrates normal parenchymal enhancement without  peripancreatic fluid or infiltration.  No adrenal gland nodules.  Kidneys are normal.  Normal caliber thoracic aorta with mild calcification.  No retroperitoneal lymphadenopathy.  The stomach is unremarkable. There is a left lower abdominal wall hernia which might represent a spigelian hernia or peristomal hernia.  This contains multiple loops of small bowel.  There is mild proximal small bowel dilatation suggesting obstruction. Distal small bowel is decompressed.  Contrast material  does extend through this area into the colon suggesting that this is not represent complete obstruction.  Focal small bowel wall thickening is suggested.  This could represent ischemia or inflammatory process.  No free air in the abdomen.  No pneumatosis.  Pelvis:  The prostate gland is mildly enlarged.  The bladder wall is not thickened.  No loculated pelvic fluid collections.  No inflammatory changes in the right lower quadrant or sigmoid colon. Small fat containing inguinal hernias.  Postoperative and degenerative changes in the lumbar spine.  IMPRESSION: Left lower quadrant abdominal wall hernia representing either a peristomal or spigelian hernia there is proximal small bowel dilatation with contrast flowing distally, suggesting partial small bowel obstruction.  Focal bowel wall thickening.  Small amount of abdominal ascites.  Cholelithiasis.  Original Report Authenticated By: Neale Burly, M.D.    Medications:    . dextrose      . enoxaparin  40 mg Subcutaneous Q24H  . insulin aspart  0-9 Units Subcutaneous Q4H  . potassium chloride  10 mEq Intravenous Q1 Hr x 4    acetaminophen, acetaminophen, dextrose, ondansetron (ZOFRAN) IV, ondansetron, zolpidem     . 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

## 2011-03-17 LAB — BASIC METABOLIC PANEL
BUN: 6 mg/dL (ref 6–23)
CO2: 20 mEq/L (ref 19–32)
Chloride: 107 mEq/L (ref 96–112)
Creatinine, Ser: 0.7 mg/dL (ref 0.50–1.10)
Glucose, Bld: 114 mg/dL — ABNORMAL HIGH (ref 70–99)
Potassium: 4.4 mEq/L (ref 3.5–5.1)

## 2011-03-17 LAB — CBC
HCT: 39.8 % (ref 36.0–46.0)
Hemoglobin: 13.4 g/dL (ref 12.0–15.0)
MCH: 30.1 pg (ref 26.0–34.0)
MCHC: 33.7 g/dL (ref 30.0–36.0)
MCV: 89.4 fL (ref 78.0–100.0)
RBC: 4.45 MIL/uL (ref 3.87–5.11)

## 2011-03-17 LAB — GLUCOSE, CAPILLARY
Glucose-Capillary: 175 mg/dL — ABNORMAL HIGH (ref 70–99)
Glucose-Capillary: 103 mg/dL — ABNORMAL HIGH (ref 70–99)
Glucose-Capillary: 88 mg/dL (ref 70–99)

## 2011-03-17 NOTE — Progress Notes (Signed)
Subjective: Patient seen and examined ,denies abdominal pain or vomiting ,nausea improving  Objective: Vital signs in last 24 hours: Temp:  [98.8 F (37.1 C)-99.6 F (37.6 C)] 99.3 F (37.4 C) (01/01 0408) Pulse Rate:  [56-62] 62  (01/01 0408) Resp:  [18] 18  (01/01 0408) BP: (162-171)/(79-86) 171/79 mmHg (01/01 0408) SpO2:  [96 %-99 %] 97 % (01/01 0408) Weight:  [62.052 kg (136 lb 12.8 oz)] 136 lb 12.8 oz (62.052 kg) (12/31 1500) Weight change:  Last BM Date: 03/16/11  Intake/Output from previous day: 12/31 0701 - 01/01 0700 In: 2415 [P.O.:240; I.V.:2175] Out: -      Physical Exam: . General: Alert, awake, oriented x3, in no acute distress.  Heart: Regular rate and rhythm, without murmurs, rubs, gallops.  Lungs: Clear to auscultation bilaterally.  Abdomen: Soft, nontender, nondistended, positive bowel sounds.colostomy bag in place ,parastomal hernia noted  Extremities: No clubbing cyanosis or edema with positive pedal pulses.  Neuro: Grossly intact, nonfocal.       Lab Results: Results for orders placed during the hospital encounter of 03/14/11 (from the past 24 hour(s))  GLUCOSE, CAPILLARY     Status: Normal   Collection Time   03/16/11 11:33 AM      Component Value Range   Glucose-Capillary 75  70 - 99 (mg/dL)  GLUCOSE, CAPILLARY     Status: Abnormal   Collection Time   03/16/11  4:15 PM      Component Value Range   Glucose-Capillary 68 (*) 70 - 99 (mg/dL)   Comment 1 Notify RN    GLUCOSE, CAPILLARY     Status: Abnormal   Collection Time   03/16/11  5:03 PM      Component Value Range   Glucose-Capillary 178 (*) 70 - 99 (mg/dL)  POTASSIUM     Status: Abnormal   Collection Time   03/16/11  5:41 PM      Component Value Range   Potassium 3.3 (*) 3.5 - 5.1 (mEq/L)  MAGNESIUM     Status: Abnormal   Collection Time   03/16/11  5:41 PM      Component Value Range   Magnesium 1.4 (*) 1.5 - 2.5 (mg/dL)  GLUCOSE, CAPILLARY     Status: Abnormal   Collection  Time   03/16/11  8:43 PM      Component Value Range   Glucose-Capillary 121 (*) 70 - 99 (mg/dL)  GLUCOSE, CAPILLARY     Status: Normal   Collection Time   03/17/11 12:53 AM      Component Value Range   Glucose-Capillary 98  70 - 99 (mg/dL)  GLUCOSE, CAPILLARY     Status: Abnormal   Collection Time   03/17/11  4:07 AM      Component Value Range   Glucose-Capillary 111 (*) 70 - 99 (mg/dL)  BASIC METABOLIC PANEL     Status: Abnormal   Collection Time   03/17/11  5:38 AM      Component Value Range   Sodium 138  135 - 145 (mEq/L)   Potassium 4.4  3.5 - 5.1 (mEq/L)   Chloride 107  96 - 112 (mEq/L)   CO2 20  19 - 32 (mEq/L)   Glucose, Bld 114 (*) 70 - 99 (mg/dL)   BUN 6  6 - 23 (mg/dL)   Creatinine, Ser 0.70  0.50 - 1.10 (mg/dL)   Calcium 8.9  8.4 - 10.5 (mg/dL)   GFR calc non Af Amer 83 (*) >90 (mL/min)   GFR calc  Af Amer >90  >90 (mL/min)  MAGNESIUM     Status: Normal   Collection Time   03/17/11  5:38 AM      Component Value Range   Magnesium 1.7  1.5 - 2.5 (mg/dL)  CBC     Status: Normal   Collection Time   03/17/11  5:38 AM      Component Value Range   WBC 5.5  4.0 - 10.5 (K/uL)   RBC 4.45  3.87 - 5.11 (MIL/uL)   Hemoglobin 13.4  12.0 - 15.0 (g/dL)   HCT 39.8  36.0 - 46.0 (%)   MCV 89.4  78.0 - 100.0 (fL)   MCH 30.1  26.0 - 34.0 (pg)   MCHC 33.7  30.0 - 36.0 (g/dL)   RDW 13.5  11.5 - 15.5 (%)   Platelets 184  150 - 400 (K/uL)  GLUCOSE, CAPILLARY     Status: Abnormal   Collection Time   03/17/11  8:09 AM      Component Value Range   Glucose-Capillary 175 (*) 70 - 99 (mg/dL)   Comment 1 Notify RN     Comment 2 Documented in Chart      Studies/Results: US Abdomen Complete  03/15/2011  *RADIOLOGY REPORT*  Clinical Data:  Abdominal pain.  Gallstones on CT.  COMPLETE ABDOMINAL ULTRASOUND  Comparison:  CT 03/15/2011.  Findings:  Gallbladder: Small gallstones are present.  There is no gallbladder wall thickening or pericholecystic fluid.  Sonographic Murphy's sign is absent.   Common bile duct:   Normal in caliber without filling defects.  Liver:  Echogenicity is within normal limits.  No focal hepatic abnormalities are identified.  IVC:  Visualized portions appear unremarkable.  Pancreas:  Visualized portions appear unremarkable.  Spleen:  Visualized portions appear unremarkable.  Right Kidney:   The renal cortical thickness and echogenicity are preserved.  There is no hydronephrosis or focal abnormality. Renal length is 10.2 cm.  Left Kidney:   The renal cortical thickness and echogenicity are preserved.  There is no hydronephrosis or focal abnormality. Renal length is 9.4 cm.  Abdominal aorta:  Visualized portions appear unremarkable. The distal aorta is not well visualized.  A small amount of ascites is noted.  IMPRESSION:  1.  Cholelithiasis.  No evidence of biliary dilatation or cholecystitis. 2.  No other significant findings.  Original Report Authenticated By: Vivia Ewing, M.D.    Medications:    . enoxaparin  40 mg Subcutaneous Q24H  . insulin aspart  0-9 Units Subcutaneous Q4H  . potassium chloride  10 mEq Intravenous Q1 Hr x 4  . potassium chloride  40 mEq Oral Q4H    acetaminophen, acetaminophen, dextrose, ondansetron (ZOFRAN) IV, ondansetron, zolpidem     . sodium chloride 75 mL/hr at 03/16/11 1832    Assessment/Plan: Partial SBO  Improving ,seems to be tolerating clear liquids ,advancement of diet as per CCS . ? Does she need repeat imaging ?,will defer to [facility]CS Active Problems:  Diabetes Mellitus:  Was hypoglycemic last evening ,CBG was 68 ,now improved ,continue with sensitive insulin scale. Colostomy hernia: plans for repair in the future noted  Gallstones:asymptomatic  Hypokalemia: Repleted Disposition: can probably be discharged in 1-2 days if can tolerate advancing diet and if ok by CCS.    LOS: 3 days   Peng Thorstenson 03/17/2011, 9:13 AM

## 2011-03-17 NOTE — Progress Notes (Signed)
Pt c/o leaking colostomy. New bags ordered. Cut bag to fit over stoma, pt able to manage from that point. [name]

## 2011-03-17 NOTE — Progress Notes (Signed)
Subjective: Denies any pain right now, Can't lie down because the colostomy bag is leaking.  New bag ordered, Hernia visible,sitting up can't really examine very well without dumping stool from colostomy.  Stool passing, but it's mostly liquid.  Not formed as it normally is. Doing well on clear liquids.    Objective: Vital signs in last 24 hours: Temp:  [98.8 F (37.1 C)-99.6 F (37.6 C)] 99.3 F (37.4 C) (01/01 0408) Pulse Rate:  [56-62] 62  (01/01 0408) Resp:  [18] 18  (01/01 0408) BP: (162-171)/(79-86) 171/79 mmHg (01/01 0408) SpO2:  [96 %-99 %] 97 % (01/01 0408) Weight:  [62.052 kg (136 lb 12.8 oz)] 136 lb 12.8 oz (62.052 kg) (12/31 1500) Last BM Date: 03/16/11  Intake/Output from previous day: 12/31 0701 - 01/01 0700 In: 2415 [P.O.:240; I.V.:2175] Out: -  Intake/Output this shift:    General appearance: alert, cooperative and no distress Resp: clear to auscultation bilaterally GI: Sitting up so abd is tense.  Stomal hernia present.  No pain or tenderness on palpation. Stool in colostomy is very liqiuid, not much substance..bowel sounds hypoactive   Lab Results:   Basename 03/17/11 0538 03/16/11 0620  WBC 5.5 4.1  HGB 13.4 11.6*  HCT 39.8 35.7*  PLT 184 159    BMET  Basename 03/17/11 0538 03/16/11 1741 03/16/11 0620  NA 138 -- 142  K 4.4 3.3* --  CL 107 -- 109  CO2 20 -- 25  GLUCOSE 114* -- 78  BUN 6 -- 8  CREATININE 0.70 -- 0.81  CALCIUM 8.9 -- 8.1*   PT/INR No results found for this basename: LABPROT:2,INR:2 in the last 72 hours   Studies/Results: US Abdomen Complete  03/15/2011  *RADIOLOGY REPORT*  Clinical Data:  Abdominal pain.  Gallstones on CT.  COMPLETE ABDOMINAL ULTRASOUND  Comparison:  CT 03/15/2011.  Findings:  Gallbladder: Small gallstones are present.  There is no gallbladder wall thickening or pericholecystic fluid.  Sonographic Murphy's sign is absent.  Common bile duct:   Normal in caliber without filling defects.  Liver:  Echogenicity  is within normal limits.  No focal hepatic abnormalities are identified.  IVC:  Visualized portions appear unremarkable.  Pancreas:  Visualized portions appear unremarkable.  Spleen:  Visualized portions appear unremarkable.  Right Kidney:   The renal cortical thickness and echogenicity are preserved.  There is no hydronephrosis or focal abnormality. Renal length is 10.2 cm.  Left Kidney:   The renal cortical thickness and echogenicity are preserved.  There is no hydronephrosis or focal abnormality. Renal length is 9.4 cm.  Abdominal aorta:  Visualized portions appear unremarkable. The distal aorta is not well visualized.  A small amount of ascites is noted.  IMPRESSION:  1.  Cholelithiasis.  No evidence of biliary dilatation or cholecystitis. 2.  No other significant findings.  Original Report Authenticated By: Vivia Ewing, M.D.    Anti-infectives: Anti-infectives    None     Current Facility-Administered Medications  Medication Dose Route Frequency Provider Last Rate Last Dose  . 0.9 %  sodium chloride infusion   Intravenous Continuous Theressa Millard, MD 75 mL/hr at 03/16/11 1832    . acetaminophen (TYLENOL) tablet 650 mg  650 mg Oral Q6H PRN Theressa Millard, MD       Or  . acetaminophen (TYLENOL) suppository 650 mg  650 mg Rectal Q6H PRN Theressa Millard, MD      . dextrose 50 % solution 25 mL  25 mL Intravenous Once PRN Sosan  Abdullah      . enoxaparin (LOVENOX) injection 40 mg  40 mg Subcutaneous Q24H Theressa Millard, MD   40 mg at 03/17/11 0945  . insulin aspart (novoLOG) injection 0-9 Units  0-9 Units Subcutaneous Q4H Theressa Millard, MD   2 Units at 03/17/11 0848  . ondansetron (ZOFRAN) tablet 4 mg  4 mg Oral Q6H PRN Theressa Millard, MD       Or  . ondansetron (ZOFRAN) injection 4 mg  4 mg Intravenous Q6H PRN Harvette Evonnie Dawes, MD      . potassium chloride 10 mEq in 100 mL IVPB  10 mEq Intravenous Q1 Hr x 4 Sosan Abdullah   10 mEq at 03/16/11 1649  . potassium  chloride 20 MEQ/15ML (10%) liquid 40 mEq  40 mEq Oral Q4H Sosan Abdullah   40 mEq at 03/16/11 2217  . 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  LOS: 3 days    Hershal Eriksson 03/17/2011

## 2011-03-17 NOTE — Progress Notes (Signed)
Feeling better and large amount of liquid stool now per ostomy.Believe she is improving and should be able to advance diet tomorrow

## 2011-03-18 ENCOUNTER — Inpatient Hospital Stay (HOSPITAL_COMMUNITY): Payer: Medicare Other

## 2011-03-18 LAB — BASIC METABOLIC PANEL
BUN: 5 mg/dL — ABNORMAL LOW (ref 6–23)
Chloride: 106 mEq/L (ref 96–112)
GFR calc Af Amer: >90 mL/min (ref >90)
Glucose, Bld: 110 mg/dL — ABNORMAL HIGH (ref 70–99)
Potassium: 3.5 mEq/L (ref 3.5–5.1)
Sodium: 137 mEq/L (ref 135–145)

## 2011-03-18 LAB — GLUCOSE, CAPILLARY
Glucose-Capillary: 125 mg/dL — ABNORMAL HIGH (ref 70–99)
Glucose-Capillary: 136 mg/dL — ABNORMAL HIGH (ref 70–99)
Glucose-Capillary: 54 mg/dL — ABNORMAL LOW (ref 70–99)
Glucose-Capillary: 110 mg/dL — ABNORMAL HIGH (ref 70–99)
Glucose-Capillary: 121 mg/dL — ABNORMAL HIGH (ref 70–99)

## 2011-03-18 LAB — CBC
HCT: 38.3 % (ref 36.0–46.0)
Hemoglobin: 12.6 g/dL (ref 12.0–15.0)
RDW: 13.6 % (ref 11.5–15.5)
WBC: 4.2 10*3/uL (ref 4.0–10.5)

## 2011-03-18 MED ORDER — HYDRALAZINE HCL 25 MG PO TABS
25.0000 mg | ORAL_TABLET | Freq: Four times a day (QID) | ORAL | Status: DC | PRN
  Filled 2011-03-18: qty 1

## 2011-03-18 MED ORDER — ALBUTEROL SULFATE HFA 108 (90 BASE) MCG/ACT IN AERS
2.0000 | INHALATION_SPRAY | RESPIRATORY_TRACT | Status: DC | PRN
  Filled 2011-03-18: qty 6.7

## 2011-03-18 NOTE — Progress Notes (Signed)
Utilization Review Completed.Donne Anon T1/04/2011

## 2011-03-18 NOTE — Progress Notes (Signed)
  Subjective: Feels good, no new c/o. Denies N/V Tolerating clears very well.  Objective: Vital signs in last 24 hours: Temp:  [98.1 F (36.7 C)-99.3 F (37.4 C)] 98.4 F (36.9 C) Mar 25, 2022 0448) Pulse Rate:  [57-90] 62  03-25-2022 0448) Resp:  [18-20] 18  2022/03/25 0448) BP: (160-190)/(67-82) 160/67 mmHg 03-25-22 0448) SpO2:  [96 %-100 %] 96 % 03/25/2022 0448) Last BM Date: 26-Mar-2011 (colostomy)  Intake/Output this shift:    Physical Exam: BP 160/67  Pulse 62  Temp(Src) 98.4 F (36.9 C) (Oral)  Resp 18  Ht 5' (1.524 m)  Wt 62.052 kg (136 lb 12.8 oz)  BMI 26.72 kg/m2  SpO2 96% Abdomen: soft, NT. OStomy looks good, peristomal hernia soft, NT  Labs: CBC  Basename 03/26/2011 0620 03/17/11 0538  WBC 4.2 5.5  HGB 12.6 13.4  HCT 38.3 39.8  PLT 153 184   BMET  Basename March 26, 2011 0620 03/17/11 0538  NA 137 138  K 3.5 4.4  CL 106 107  CO2 21 20  GLUCOSE 110* 114*  BUN 5* 6  CREATININE 0.71 0.70  CALCIUM 8.8 8.9   LFT No results found for this basename: PROT,ALBUMIN,AST,ALT,ALKPHOS,BILITOT,BILIDIR,IBILI,LIPASE in the last 72 hours PT/INR No results found for this basename: LABPROT:2,INR:2 in the last 72 hours ABG No results found for this basename: PHART:2,PCO2:2,PO2:2,HCO3:2 in the last 72 hours  Studies/Results: Dg Abd 2 Views  03-26-2011  *RADIOLOGY REPORT*  Clinical Data: Partial small bowel obstruction.  ABDOMEN - 2 VIEW  Comparison: 03/15/2011 CT.  08/09/2010 plain film exam.  Findings: Contrast throughout the transverse colon, proximal descending colon entering the left sided colostomy.  Gas distended dilated small bowel loop left paracentral upper abdomen.  Gas filled nondistended stomach.  No free intraperitoneal air.  Cardiomegaly.  Postsurgical changes lumbar spine.  Bilateral hip joint degenerative changes.  IMPRESSION: Nonspecific bowel gas pattern with gas filled dilated small bowel loop left paracentral upper abdomen.  Original Report Authenticated By: Doug Sou, M.D.    Assessment: Active Problems:  Gallstones  Partial SBO  Colostomy hernia   Plan: Contrast progressed though colon. Ok to advance diet.  LOS: 4 days    [name] 03-26-11

## 2011-03-18 NOTE — Progress Notes (Addendum)
Subjective: Patient seen and examined ,diet advanced to full liquid diet. She is tolerating it well. Objective: Vital signs in last 24 hours: Temp:  [97.6 F (36.4 C)-98.4 F (36.9 C)] 97.6 F (36.4 C) (01/02 1452) Pulse Rate:  [62-90] 63  (01/02 1452) Resp:  [18-20] 20  (01/02 1452) BP: (160-177)/(67-82) 175/70 mmHg (01/02 1452) SpO2:  [96 %-100 %] 100 % (01/02 1452) Weight change:  Last BM Date: 03/18/11 (colostomy)  Intake/Output from previous day: 01/01 0701 - 01/02 0700 In: 1320 [P.O.:720; I.V.:600] Out: -  Total I/O In: 480 [P.O.:480] Out: 600 [Urine:600]   Physical Exam: . General: Alert, awake, oriented x3, in no acute distress.  Heart: Regular rate and rhythm, without murmurs, rubs, gallops.  Lungs: Clear to auscultation bilaterally.  Abdomen: Soft, nontender, nondistended Extremities: No clubbing cyanosis or edema with positive pedal pulses.  Neuro: Grossly intact, nonfocal.       Lab Results: Results for orders placed during the hospital encounter of 03/14/11 (from the past 24 hour(s))  GLUCOSE, CAPILLARY     Status: Abnormal   Collection Time   03/17/11  9:13 PM      Component Value Range   Glucose-Capillary 102 (*) 70 - 99 (mg/dL)  GLUCOSE, CAPILLARY     Status: Abnormal   Collection Time   03/18/11 12:21 AM      Component Value Range   Glucose-Capillary 125 (*) 70 - 99 (mg/dL)   Comment 1 Notify RN    GLUCOSE, CAPILLARY     Status: Abnormal   Collection Time   03/18/11  4:47 AM      Component Value Range   Glucose-Capillary 136 (*) 70 - 99 (mg/dL)   Comment 1 Notify RN    CBC     Status: Normal   Collection Time   03/18/11  6:20 AM      Component Value Range   WBC 4.2  4.0 - 10.5 (K/uL)   RBC 4.23  3.87 - 5.11 (MIL/uL)   Hemoglobin 12.6  12.0 - 15.0 (g/dL)   HCT 38.3  36.0 - 46.0 (%)   MCV 90.5  78.0 - 100.0 (fL)   MCH 29.8  26.0 - 34.0 (pg)   MCHC 32.9  30.0 - 36.0 (g/dL)   RDW 13.6  11.5 - 15.5 (%)   Platelets 153  150 - 400 (K/uL)  BASIC  METABOLIC PANEL     Status: Abnormal   Collection Time   03/18/11  6:20 AM      Component Value Range   Sodium 137  135 - 145 (mEq/L)   Potassium 3.5  3.5 - 5.1 (mEq/L)   Chloride 106  96 - 112 (mEq/L)   CO2 21  19 - 32 (mEq/L)   Glucose, Bld 110 (*) 70 - 99 (mg/dL)   BUN 5 (*) 6 - 23 (mg/dL)   Creatinine, Ser 0.71  0.50 - 1.10 (mg/dL)   Calcium 8.8  8.4 - 10.5 (mg/dL)   GFR calc non Af Amer 82 (*) >90 (mL/min)   GFR calc Af Amer >90  >90 (mL/min)  GLUCOSE, CAPILLARY     Status: Abnormal   Collection Time   03/18/11  8:12 AM      Component Value Range   Glucose-Capillary 213 (*) 70 - 99 (mg/dL)   Comment 1 Documented in Chart     Comment 2 Notify RN    GLUCOSE, CAPILLARY     Status: Abnormal   Collection Time   03/18/11 10:57 AM  Component Value Range   Glucose-Capillary 54 (*) 70 - 99 (mg/dL)   Comment 1 Documented in Chart     Comment 2 Notify RN    GLUCOSE, CAPILLARY     Status: Abnormal   Collection Time   03-29-2011 10:59 AM      Component Value Range   Glucose-Capillary 54 (*) 70 - 99 (mg/dL)   Comment 1 Documented in Chart     Comment 2 Notify RN    GLUCOSE, CAPILLARY     Status: Abnormal   Collection Time   03/29/2011  4:12 PM      Component Value Range   Glucose-Capillary 110 (*) 70 - 99 (mg/dL)   Comment 1 Documented in Chart     Comment 2 Notify RN      Studies/Results: Dg Abd 2 Views  2011-03-29  *RADIOLOGY REPORT*  Clinical Data: Partial small bowel obstruction.  ABDOMEN - 2 VIEW  Comparison: 03/15/2011 CT.  08/09/2010 plain film exam.  Findings: Contrast throughout the transverse colon, proximal descending colon entering the left sided colostomy.  Gas distended dilated small bowel loop left paracentral upper abdomen.  Gas filled nondistended stomach.  No free intraperitoneal air.  Cardiomegaly.  Postsurgical changes lumbar spine.  Bilateral hip joint degenerative changes.  IMPRESSION: Nonspecific bowel gas pattern with gas filled dilated small bowel loop left  paracentral upper abdomen.  Original Report Authenticated By: Doug Sou, M.D.    Medications:    . enoxaparin  40 mg Subcutaneous Q24H  . insulin aspart  0-9 Units Subcutaneous Q4H    acetaminophen, acetaminophen, albuterol, ondansetron (ZOFRAN) IV, ondansetron, zolpidem     . sodium chloride 75 mL/hr at 03-29-2011 0431    Assessment/Plan: Partial small Bowel Obstruction Resolved. Tolerataing Full Liquid diet. Surgery following.  Diabetes Mellitus:  SSI BG controlled. 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 Gallstones: Asymptomatic.  Hypertension: Start hydralazine 25 mg po q 6 hr prn.  ? D/c home in am.. If able to tolerate the solid food.    LOS: 4 days   LAMA,GAGAN S 03-29-11, 4:52 PM

## 2011-03-19 LAB — GLUCOSE, CAPILLARY
Glucose-Capillary: 123 mg/dL — ABNORMAL HIGH (ref 70–99)
Glucose-Capillary: 133 mg/dL — ABNORMAL HIGH (ref 70–99)
Glucose-Capillary: 159 mg/dL — ABNORMAL HIGH (ref 70–99)

## 2011-03-19 MED ORDER — AMLODIPINE BESYLATE 5 MG PO TABS: 5.0000 mg | ORAL_TABLET | Freq: Every day | ORAL | Status: DC

## 2011-03-19 MED ORDER — LISINOPRIL 10 MG PO TABS: 10.0000 mg | ORAL_TABLET | Freq: Every day | ORAL | Status: DC

## 2011-03-19 MED ORDER — LISINOPRIL 10 MG PO TABS
10.0000 mg | ORAL_TABLET | Freq: Every day | ORAL | Status: DC
  Administered 2011-03-19: 10 mg via ORAL
  Filled 2011-03-19: qty 1

## 2011-03-19 MED ORDER — AMLODIPINE BESYLATE 5 MG PO TABS
5.0000 mg | ORAL_TABLET | Freq: Every day | ORAL | Status: DC
  Administered 2011-03-19: 5 mg via ORAL
  Filled 2011-03-19: qty 1

## 2011-03-19 NOTE — Progress Notes (Signed)
  Subjective: Pt ok. No N/V. No pain. Active on her own.  Objective: Vital signs in last 24 hours: Temp:  [97.6 F (36.4 C)-99.4 F (37.4 C)] 98.5 F (36.9 C) (01/03 0537) Pulse Rate:  [57-63] 57  (01/03 0537) Resp:  [20] 20  (01/03 0537) BP: (152-175)/(69-86) 170/69 mmHg (01/03 0537) SpO2:  [97 %-100 %] 97 % (01/03 0537) Last BM Date: 03/19/11  Intake/Output this shift:    Physical Exam: BP 170/69  Pulse 57  Temp(Src) 98.5 F (36.9 C) (Oral)  Resp 20  Ht 5' (1.524 m)  Wt 136 lb 12.8 oz (62.052 kg)  BMI 26.72 kg/m2  SpO2 97% Abdomen: soft, ND, NT Ostomy pink, viable, good output.  Labs: CBC  Basename 2011-03-28 0620 03/17/11 0538  WBC 4.2 5.5  HGB 12.6 13.4  HCT 38.3 39.8  PLT 153 184   BMET  Basename 03/28/11 0620 03/17/11 0538  NA 137 138  K 3.5 4.4  CL 106 107  CO2 21 20  GLUCOSE 110* 114*  BUN 5* 6  CREATININE 0.71 0.70  CALCIUM 8.8 8.9   LFT No results found for this basename: PROT,ALBUMIN,AST,ALT,ALKPHOS,BILITOT,BILIDIR,IBILI,LIPASE in the last 72 hours PT/INR No results found for this basename: LABPROT:2,INR:2 in the last 72 hours ABG No results found for this basename: PHART:2,PCO2:2,PO2:2,HCO3:2 in the last 72 hours  Studies/Results: Dg Abd 2 Views  March 28, 2011  *RADIOLOGY REPORT*  Clinical Data: Partial small bowel obstruction.  ABDOMEN - 2 VIEW  Comparison: 03/15/2011 CT.  08/09/2010 plain film exam.  Findings: Contrast throughout the transverse colon, proximal descending colon entering the left sided colostomy.  Gas distended dilated small bowel loop left paracentral upper abdomen.  Gas filled nondistended stomach.  No free intraperitoneal air.  Cardiomegaly.  Postsurgical changes lumbar spine.  Bilateral hip joint degenerative changes.  IMPRESSION: Nonspecific bowel gas pattern with gas filled dilated small bowel loop left paracentral upper abdomen.  Original Report Authenticated By: Doug Sou, M.D.    Assessment: Active Problems:  Gallstones  Partial SBO  Colostomy hernia     Plan: Advance diet Prob ok for DC later today  LOS: 5 days    Federico Flake 03/19/2011

## 2011-03-19 NOTE — Progress Notes (Signed)
Pt given dc instructions. Rn went over medications and incision/wound care. Pt verbalized understanding and had no questions regarding care of instructions.

## 2011-03-19 NOTE — Discharge Summary (Signed)
[name], 78 y.o., DOB 11-03-1933, MRN [id number]. Admission date: 03/14/2011 Discharge Date 03/19/2011 Primary MD Alcide Evener, MD, MD Admitting Physician Geraldine Solar  Admission Diagnosis  BACK PAIN ABD PAIN   Discharge Diagnosis   Active Problems:  Gallstones  Partial SBO  Colostomy hernia      Past Medical History  Diagnosis Date  . Diabetes mellitus     Brief History and physical [name] is an 78 y.o. female who presented to the ED with complaints of severe generalized abdominal pain that started 30 minutes prior to her arrival at the ED. She reports having nausea and vomiting. She has a colostomy and she denies having any change in the output from her colostomy. She denies passing any bloody stools, and she also denies having any fevers or chills.  In the ED a CT scan was performed which revealed a partial small bowel obstruction as well as a ventral hernia. Patient was referred for medical admission.    Hospital Course   Partial Small Bowel obstruction: Patient was seen by surgery and managed medically. Partial SBO has resolved. Patient has Colostomy in place.  Hypertension: Started on amlodipine and lisinopril for hypertension.  Diabetes mellitus: Continue Glipizide.  Cholelithiasis: Ultrasound showed Cholelithiasis but no cholecystitis.     Consults   Surgery  Significant Tests:  See full reports for all details    US Abdomen Complete  03/15/2011  *RADIOLOGY REPORT*  Clinical Data:  Abdominal pain.  Gallstones on CT.  COMPLETE ABDOMINAL ULTRASOUND  Comparison:  CT 03/15/2011.  Findings:  Gallbladder: Small gallstones are present.  There is no gallbladder wall thickening or pericholecystic fluid.  Sonographic Murphy's sign is absent.  Common bile duct:   Normal in caliber without filling defects.  Liver:  Echogenicity is within normal limits.  No focal hepatic abnormalities are identified.  IVC:  Visualized portions appear unremarkable.   Pancreas:  Visualized portions appear unremarkable.  Spleen:  Visualized portions appear unremarkable.  Right Kidney:   The renal cortical thickness and echogenicity are preserved.  There is no hydronephrosis or focal abnormality. Renal length is 10.2 cm.  Left Kidney:   The renal cortical thickness and echogenicity are preserved.  There is no hydronephrosis or focal abnormality. Renal length is 9.4 cm.  Abdominal aorta:  Visualized portions appear unremarkable. The distal aorta is not well visualized.  A small amount of ascites is noted.   IMPRESSION:  1.  Cholelithiasis.  No evidence of biliary dilatation or cholecystitis. 2.  No other significant findings.  Original Report Authenticated By: Vivia Ewing, M.D.   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 The interpretation of the pelvis should read atrophic uterus without abnormal adnexal mass.  The left lower quadrant hernia is confirmed to represent a peristomal hernia with a left lower quadrant descending colostomy and decompressed rectal stump.  **END ADDENDUM** SIGNED BY: Neale Burly, M.D.   03/15/2011  *RADIOLOGY REPORT*  Clinical Data: Lower abdominal pain, nausea vomiting and diarrhea  CT ABDOMEN AND PELVIS WITH CONTRAST  Technique:  Multidetector CT imaging of the abdomen and pelvis was performed following the standard protocol during bolus administration of intravenous contrast.  Contrast: 7mL OMNIPAQUE IOHEXOL 300 MG/ML IV SOLN  Comparison: None.  Findings: Atelectasis in the lung bases.  Small sliver of fluid around the liver and extending along the pericolic gutters to the pelvis  with low density, consistent with ascites.  The liver and spleen are unremarkable.  Stones in the dependent portion of the gallbladder.  Bile ducts are not dilated but there is mild dilatation of the pancreatic duct.  No obstructing lesion or stone  is visualized.  Consider MRCP for further evaluation.  The pancreas demonstrates normal parenchymal enhancement without peripancreatic fluid or infiltration.  No adrenal gland nodules.  Kidneys are normal.  Normal caliber thoracic aorta with mild calcification.  No retroperitoneal lymphadenopathy.  The stomach is unremarkable. There is a left lower abdominal wall hernia which might represent a spigelian hernia or peristomal hernia.  This contains multiple loops of small bowel.  There is mild proximal small bowel dilatation suggesting obstruction. Distal small bowel is decompressed.  Contrast material does extend through this area into the colon suggesting that this is not represent complete obstruction.  Focal small bowel wall thickening is suggested.  This could represent ischemia or inflammatory process.  No free air in the abdomen.  No pneumatosis.  Pelvis:  The prostate gland is mildly enlarged.  The bladder wall is not thickened.  No loculated pelvic fluid collections.  No inflammatory changes in the right lower quadrant or sigmoid colon. Small fat containing inguinal hernias.  Postoperative and degenerative changes in the lumbar spine.   : Left lower quadrant abdominal wall hernia representing either a peristomal or spigelian hernia there is proximal small bowel dilatation with contrast flowing distally, suggesting partial small bowel obstruction.  Focal bowel wall thickening.  Small amount of abdominal ascites.  Cholelithiasis.  Original Report Authenticated By: Neale Burly, M.D.   Dg Abd 2 Views  03/18/2011  *RADIOLOGY REPORT*  Clinical Data: Partial small bowel obstruction.  ABDOMEN - 2 VIEW  Comparison: 03/15/2011 CT.  08/09/2010 plain film exam.  Findings: Contrast throughout the transverse colon, proximal descending colon entering the left sided colostomy.  Gas distended dilated small bowel loop left paracentral upper abdomen.  Gas filled nondistended stomach.  No free intraperitoneal air.   Cardiomegaly.  Postsurgical changes lumbar spine.  Bilateral hip joint degenerative changes.  IMPRESSION: Nonspecific bowel gas pattern with gas filled dilated small bowel loop left paracentral upper abdomen.  Original Report Authenticated By: Doug Sou, M.D.     Today   Subjective:   Dim Hannay today has no headache,no chest abdominal pain. Objective:   Blood pressure 150/68, pulse 63, temperature 98.6 F (37 C), temperature source Oral, resp. rate 18, height 5' (1.524 m), weight 62.052 kg (136 lb 12.8 oz), SpO2 100.00%.  Intake/Output Summary (Last 24 hours) at 03/19/11 1410 Last data filed at 03/19/11 1300  Gross per 24 hour  Intake    600 ml  Output      0 ml  Net    600 ml    Exam Awake Alert, Oriented *3, No new F.N deficits, Normal affect Benjamin.AT,PERRAL Supple Neck,No JVD, No cervical lymphadenopathy appriciated.  Symmetrical Chest wall movement, Good air movement bilaterally, CTAB RRR,No Gallops,Rubs or new Murmurs, No Parasternal Heave +ve B.Sounds, Abd Soft, Non tender, No organomegaly appriciated, No rebound -guarding or rigidity. No Cyanosis, Clubbing or edema, No new Rash or bruise  Data Review     CBC w Diff: Lab Results  Component Value Date   WBC 4.2 03/18/2011   HGB 12.6 03/18/2011   HCT 38.3 03/18/2011   PLT 153 03/18/2011   LYMPHOPCT 9* 03/15/2011   MONOPCT 6 03/15/2011   EOSPCT 1 03/15/2011   BASOPCT 0 03/15/2011  CMP: Lab Results  Component Value Date   NA 137 03/18/2011   K 3.5 03/18/2011   CL 106 03/18/2011   CO2 21 03/18/2011   BUN 5* 03/18/2011   CREATININE 0.71 03/18/2011   PROT 7.3 03/15/2011   ALBUMIN 3.3* 03/15/2011   BILITOT 0.3 03/15/2011   ALKPHOS 78 03/15/2011   AST 31 03/15/2011   ALT 12 03/15/2011  .  Micro Results No results found for this or any previous visit (from the past 240 hour(s)).   Discharge Instructions      Follow-up Information    Follow up with [facility],MD. (Dr. Margot Chimes or Hassell Done or choice.)    Contact information:     [facility] [address] [place] [phone]          Discharge Medications   Medication List  As of 03/19/2011  2:10 PM   START taking these medications         amLODipine 5 MG tablet   Commonly known as: NORVASC   Take 1 tablet (5 mg total) by mouth daily.      lisinopril 10 MG tablet   Commonly known as: PRINIVIL,ZESTRIL   Take 1 tablet (10 mg total) by mouth daily.         CONTINUE taking these medications         alendronate 70 MG tablet   Commonly known as: FOSAMAX      glipiZIDE 5 MG 24 hr tablet   Commonly known as: GLUCOTROL XL      probenecid 500 MG tablet   Commonly known as: BENEMID          Where to get your medications    These are the prescriptions that you need to pick up.   You may get these medications from any pharmacy.         amLODipine 5 MG tablet   lisinopril 10 MG tablet             Total Time in preparing paper work, data evaluation and todays exam - 35 minutes  Yusif Gnau S M.D on 03/19/2011 at 2:10 PM  [facility]  [phone]

## 2011-04-03 ENCOUNTER — Encounter (INDEPENDENT_AMBULATORY_CARE_PROVIDER_SITE_OTHER): Payer: Self-pay | Admitting: Surgery

## 2011-04-10 ENCOUNTER — Encounter (INDEPENDENT_AMBULATORY_CARE_PROVIDER_SITE_OTHER): Payer: Medicare Other | Admitting: Surgery

## 2011-09-01 ENCOUNTER — Emergency Department (HOSPITAL_COMMUNITY): Payer: Medicare Other

## 2011-09-01 ENCOUNTER — Encounter (HOSPITAL_COMMUNITY): Payer: Self-pay | Admitting: Emergency Medicine

## 2011-09-01 ENCOUNTER — Emergency Department (HOSPITAL_COMMUNITY)
Admission: EM | Admit: 2011-09-01 | Discharge: 2011-09-01 | Disposition: A | Discharge status: Home / Self Care | Payer: Medicare Other | Attending: Emergency Medicine | Admitting: Emergency Medicine

## 2011-09-01 DIAGNOSIS — R109 Unspecified abdominal pain: Secondary | ICD-10-CM | POA: Insufficient documentation

## 2011-09-01 DIAGNOSIS — Z933 Colostomy status: Secondary | ICD-10-CM | POA: Insufficient documentation

## 2011-09-01 DIAGNOSIS — K6289 Other specified diseases of anus and rectum: Secondary | ICD-10-CM | POA: Insufficient documentation

## 2011-09-01 DIAGNOSIS — E119 Type 2 diabetes mellitus without complications: Secondary | ICD-10-CM | POA: Insufficient documentation

## 2011-09-01 DIAGNOSIS — N390 Urinary tract infection, site not specified: Secondary | ICD-10-CM | POA: Insufficient documentation

## 2011-09-01 HISTORY — DX: Unspecified asthma, uncomplicated: J45.909

## 2011-09-01 LAB — URINALYSIS, ROUTINE W REFLEX MICROSCOPIC
Nitrite: NEGATIVE
Protein, ur: 30 mg/dL — AB
Specific Gravity, Urine: 1.022 (ref 1.005–1.030)
Urobilinogen, UA: 0.2 mg/dL (ref 0.0–1.0)

## 2011-09-01 LAB — URINE MICROSCOPIC-ADD ON

## 2011-09-01 LAB — COMPREHENSIVE METABOLIC PANEL
Albumin: 3.2 g/dL — ABNORMAL LOW (ref 3.5–5.2)
BUN: 11 mg/dL (ref 6–23)
Calcium: 9.3 mg/dL (ref 8.4–10.5)
Chloride: 103 mEq/L (ref 96–112)
Creatinine, Ser: 0.76 mg/dL (ref 0.50–1.10)
GFR calc non Af Amer: 80 mL/min — ABNORMAL LOW (ref >90)
Total Bilirubin: 0.5 mg/dL (ref 0.3–1.2)

## 2011-09-01 LAB — DIFFERENTIAL
Basophils Absolute: 0 10*3/uL (ref 0.0–0.1)
Basophils Relative: 0 % (ref 0–1)
Eosinophils Absolute: 0.1 10*3/uL (ref 0.0–0.7)
Monocytes Relative: 7 % (ref 3–12)
Neutro Abs: 7 10*3/uL (ref 1.7–7.7)
Neutrophils Relative %: 70 % (ref 43–77)

## 2011-09-01 LAB — CBC
Hemoglobin: 13.2 g/dL (ref 12.0–15.0)
MCH: 30.1 pg (ref 26.0–34.0)
MCHC: 33.4 g/dL (ref 30.0–36.0)
Platelets: 207 10*3/uL (ref 150–400)
RDW: 13.4 % (ref 11.5–15.5)

## 2011-09-01 LAB — OCCULT BLOOD, POC DEVICE: Fecal Occult Bld: NEGATIVE

## 2011-09-01 LAB — LACTIC ACID, PLASMA: Lactic Acid, Venous: 1.7 mmol/L (ref 0.5–2.2)

## 2011-09-01 LAB — GLUCOSE, CAPILLARY: Glucose-Capillary: 175 mg/dL — ABNORMAL HIGH (ref 70–99)

## 2011-09-01 MED ORDER — METRONIDAZOLE IN NACL 5-0.79 MG/ML-% IV SOLN
500.0000 mg | Freq: Once | INTRAVENOUS | Status: AC
  Administered 2011-09-01: 500 mg via INTRAVENOUS
  Filled 2011-09-01: qty 100

## 2011-09-01 MED ORDER — METRONIDAZOLE 500 MG PO TABS: 500.0000 mg | ORAL_TABLET | Freq: Two times a day (BID) | ORAL | Status: AC

## 2011-09-01 MED ORDER — DEXTROSE 5 % IV BOLUS
500.0000 mL | Freq: Once | INTRAVENOUS | Status: AC
  Administered 2011-09-01: 500 mL via INTRAVENOUS

## 2011-09-01 MED ORDER — CIPROFLOXACIN HCL 500 MG PO TABS: 500.0000 mg | ORAL_TABLET | Freq: Two times a day (BID) | ORAL | Status: AC

## 2011-09-01 MED ORDER — DEXTROSE 5 % IV SOLN
1.0000 g | Freq: Once | INTRAVENOUS | Status: AC
  Administered 2011-09-01: 1 g via INTRAVENOUS
  Filled 2011-09-01: qty 10

## 2011-09-01 MED ORDER — IOHEXOL 300 MG/ML  SOLN
80.0000 mL | Freq: Once | INTRAMUSCULAR | Status: AC | PRN
  Administered 2011-09-01: 80 mL via INTRAVENOUS

## 2011-09-01 MED ORDER — CIPROFLOXACIN IN D5W 400 MG/200ML IV SOLN
400.0000 mg | Freq: Once | INTRAVENOUS | Status: AC
  Administered 2011-09-01: 400 mg via INTRAVENOUS
  Filled 2011-09-01: qty 200

## 2011-09-01 MED ORDER — DEXTROSE 50 % IV SOLN: 50.0000 mL | Freq: Once | INTRAVENOUS | Status: DC

## 2011-09-01 MED ORDER — SODIUM CHLORIDE 0.9 % IV SOLN: Freq: Once | INTRAVENOUS | Status: DC

## 2011-09-01 NOTE — ED Notes (Signed)
Tonia Ghent, NT chaperoned Rancour, MD with a rectal examination

## 2011-09-01 NOTE — ED Provider Notes (Signed)
History     CSN: [id number]  Arrival date & time 09/01/11  1017   First MD Initiated Contact with Patient 09/01/11 1305      Chief Complaint  Patient presents with  . Abdominal Pain    (Consider location/radiation/quality/duration/timing/severity/associated sxs/prior treatment) HPI Comments: Patient presents with abdominal pain, decreased colostomy output, bright red blood per rectum for the past 2 days. Her colostomy was placed about 15 years ago for colonic perforation. She's not had any vomiting, fever, chest pain or shortness of breath. She has a history of a parastomal hernia does not know why her colostomy has never been reversed. She was admitted in December with small bowel obstruction. This pain feels different. Today she said gas in her colostomy the stool out. She states that she's has had stool and blood per her rectum has not had any output from her rectum for the past 10 years.  The history is provided by the patient.    Past Medical History  Diagnosis Date  . Diabetes mellitus   . Asthma     History reviewed. No pertinent past surgical history.  No family history on file.  History  Substance Use Topics  . Smoking status: Never Smoker   . Smokeless tobacco: Not on file  . Alcohol Use: No    OB History    Grav Para Term Preterm Abortions TAB SAB Ect Mult Living                  Review of Systems  Constitutional: Negative for activity change and appetite change.  HENT: Negative for congestion and rhinorrhea.   Respiratory: Negative for cough, chest tightness and shortness of breath.   Cardiovascular: Negative for chest pain.  Gastrointestinal: Positive for abdominal pain, diarrhea, blood in stool and abdominal distention. Negative for nausea and vomiting.  Genitourinary: Negative for dysuria, hematuria, vaginal bleeding and vaginal discharge.  Musculoskeletal: Negative for back pain.  Skin: Negative for rash.  Neurological: Negative for dizziness,  weakness and headaches.    Allergies  Review of patient's allergies indicates no known allergies.  Home Medications   Current Outpatient Rx  Name Route Sig Dispense Refill  . DONEPEZIL HCL 5 MG PO TABS Oral Take 5 mg by mouth at bedtime.    Marland Kitchen GLIPIZIDE ER 5 MG PO TB24 Oral Take 5 mg by mouth daily.      Marland Kitchen PROBENECID 500 MG PO TABS Oral Take 250 mg by mouth daily.       BP 149/81  Pulse 71  Temp 97.5 F (36.4 C) (Oral)  Resp 18  SpO2 97%  Physical Exam  Constitutional: She is oriented to person, place, and time. She appears well-developed and well-nourished. No distress.  HENT:  Head: Atraumatic.  Mouth/Throat: Oropharynx is clear and moist. No oropharyngeal exudate.  Eyes: Pupils are equal, round, and reactive to light.  Neck: Normal range of motion. Neck supple.  Cardiovascular: Normal rate, regular rhythm and normal heart sounds.   No murmur heard. Pulmonary/Chest: Effort normal and breath sounds normal. No respiratory distress.  Abdominal: Soft. There is tenderness. There is no rebound and no guarding.       LLQ colostomy in place.  TTP diffusely.  No stool in bag.  Ostomy pink  Musculoskeletal: Normal range of motion. She exhibits no edema and no tenderness.  Neurological: She is alert and oriented to person, place, and time. No cranial nerve deficit.  Skin: Skin is warm.    ED Course  Procedures (  including critical care time)  Labs Reviewed  URINALYSIS, ROUTINE W REFLEX MICROSCOPIC - Abnormal; Notable for the following:    Color, Urine AMBER (*)  BIOCHEMICALS MAY BE AFFECTED BY COLOR   APPearance CLOUDY (*)     Hgb urine dipstick MODERATE (*)     Bilirubin Urine SMALL (*)     Ketones, ur 15 (*)     Protein, ur 30 (*)     Leukocytes, UA LARGE (*)     All other components within normal limits  COMPREHENSIVE METABOLIC PANEL - Abnormal; Notable for the following:    Potassium 3.2 (*)     Glucose, Bld 50 (*)     Albumin 3.2 (*)     GFR calc non Af Amer 80 (*)      All other components within normal limits  URINE MICROSCOPIC-ADD ON - Abnormal; Notable for the following:    Squamous Epithelial / LPF MANY (*)     Bacteria, UA MANY (*)     All other components within normal limits  CBC  DIFFERENTIAL  LACTIC ACID, PLASMA  OCCULT BLOOD, POC DEVICE  URINE CULTURE   No results found.   1. Abdominal pain   2. Urinary tract infection       MDM  Abdominal pain, history of colostomy.  Nausea, no vomiting, decreased colostomy output. Rectal bleeding.  Vitals stable.  Vital stable, hemoglobin stable. No stool in rectal vault, no blood in rectal vault.  Labs, symptom control, CT abdomen and pelvis to evaluate anatomy after multiple surgeries.      Ezequiel Essex, MD 09/01/11 1600

## 2011-09-01 NOTE — ED Provider Notes (Signed)
Assumed care of patient in the CDU from Dr. Wyvonnia Dusky.  Patient with a history of SBO and currently has a colostomy bag in place.  Patient has been having abdominal pain and decreased output from her colostomy bag.  Yesterday patient began having stool coming from her rectum for the first time in 10 years.  Patient currently awaiting CT of her abdomen  4:15 PM Discussed results of the CT with Dr. Wyvonnia Dusky.  Patient can be discharged home with prescription for Cipro and Flayl and given referral for [facility].  6:08 PM Reassessed patient.  Patient is eating dinner.  She reports that her pain has improved.  Patient able to tolerate po liquids and food without difficulty.  Will discharge patient home.    Sherlyn Lees Stiles, PA-C 09/01/11 2150

## 2011-09-01 NOTE — Discharge Instructions (Signed)
Take antibiotics as directed.  Urinary Tract Infection Infections of the urinary tract can start in several places. A bladder infection (cystitis), a kidney infection (pyelonephritis), and a prostate infection (prostatitis) are different types of urinary tract infections (UTIs). They usually get better if treated with medicines (antibiotics) that kill germs. Take all the medicine until it is gone. You or your child may feel better in a few days, but TAKE ALL MEDICINE or the infection may not respond and may become more difficult to treat. HOME CARE INSTRUCTIONS   Drink enough water and fluids to keep the urine clear or pale yellow. Cranberry juice is especially recommended, in addition to large amounts of water.   Avoid caffeine, tea, and carbonated beverages. They tend to irritate the bladder.   Alcohol may irritate the prostate.   Only take over-the-counter or prescription medicines for pain, discomfort, or fever as directed by your caregiver.  To prevent further infections:  Empty the bladder often. Avoid holding urine for long periods of time.   After a bowel movement, women should cleanse from front to back. Use each tissue only once.   Empty the bladder before and after sexual intercourse.  FINDING OUT THE RESULTS OF YOUR TEST Not all test results are available during your visit. If your or your child's test results are not back during the visit, make an appointment with your caregiver to find out the results. Do not assume everything is normal if you have not heard from your caregiver or the medical facility. It is important for you to follow up on all test results. SEEK MEDICAL CARE IF:   There is back pain.   Your baby is older than 3 months with a rectal temperature of 100.5 F (38.1 C) or higher for more than 1 day.   Your or your child's problems (symptoms) are no better in 3 days. Return sooner if you or your child is getting worse.  SEEK IMMEDIATE MEDICAL CARE IF:    There is severe back pain or lower abdominal pain.   You or your child develops chills.   You have a fever.   Your baby is older than 3 months with a rectal temperature of 102 F (38.9 C) or higher.   Your baby is 73 months old or younger with a rectal temperature of 100.4 F (38 C) or higher.   There is nausea or vomiting.   There is continued burning or discomfort with urination.  MAKE SURE YOU:   Understand these instructions.   Will watch your condition.   Will get help right away if you are not doing well or get worse.  Document Released: 12/10/2004 Document Revised: 02/19/2011 Document Reviewed: 07/15/2006 [facility] Patient Information 2012 [employer].

## 2011-09-01 NOTE — ED Notes (Addendum)
CBG: 61  Treatment:500 ml d5w plus 3 IV antibiotics in D5W totaling 417ml  Symptoms: None  Follow-up CBG: Time:1705 CBG Result:175  Possible Reasons for Event: Inadequate meal intake  Comments/MD notified:ED MD     Theone Stanley

## 2011-09-01 NOTE — ED Notes (Signed)
Pt. Stated, i started having abdominal pain yesterday and it goes to my back.  I have a colostomy bag and there's nothing in there.

## 2011-09-01 NOTE — ED Notes (Signed)
Hourly rounded f wait time

## 2011-09-01 NOTE — ED Notes (Signed)
Patient is resting comfortably. 

## 2011-09-01 NOTE — ED Notes (Signed)
Heart Healthy Diet Ordered  

## 2011-09-03 LAB — URINE CULTURE: Culture: NO GROWTH

## 2011-09-03 NOTE — ED Provider Notes (Signed)
Medical screening examination/treatment/procedure(s) were conducted as a shared visit with non-physician practitioner(s) and myself.  I personally evaluated the patient during the encounter   Ezequiel Essex, MD 09/03/11 1435

## 2012-02-17 ENCOUNTER — Other Ambulatory Visit: Payer: Self-pay | Admitting: Internal Medicine

## 2012-02-17 DIAGNOSIS — Z1231 Encounter for screening mammogram for malignant neoplasm of breast: Secondary | ICD-10-CM

## 2012-03-31 ENCOUNTER — Ambulatory Visit: Payer: Medicare Other

## 2012-04-09 ENCOUNTER — Inpatient Hospital Stay (HOSPITAL_COMMUNITY)
Admission: EM | Admit: 2012-04-09 | Discharge: 2012-04-12 | DRG: 390 | Disposition: A | Discharge status: Home / Self Care | Payer: Medicare Other | Attending: Internal Medicine | Admitting: Internal Medicine

## 2012-04-09 ENCOUNTER — Emergency Department (HOSPITAL_COMMUNITY): Payer: Medicare Other

## 2012-04-09 ENCOUNTER — Encounter (HOSPITAL_COMMUNITY): Payer: Self-pay | Admitting: Neurology

## 2012-04-09 DIAGNOSIS — N289 Disorder of kidney and ureter, unspecified: Secondary | ICD-10-CM

## 2012-04-09 DIAGNOSIS — K56600 Partial intestinal obstruction, unspecified as to cause: Secondary | ICD-10-CM

## 2012-04-09 DIAGNOSIS — E119 Type 2 diabetes mellitus without complications: Secondary | ICD-10-CM | POA: Diagnosis present

## 2012-04-09 DIAGNOSIS — K56609 Unspecified intestinal obstruction, unspecified as to partial versus complete obstruction: Secondary | ICD-10-CM

## 2012-04-09 DIAGNOSIS — K565 Intestinal adhesions [bands], unspecified as to partial versus complete obstruction: Principal | ICD-10-CM | POA: Diagnosis present

## 2012-04-09 DIAGNOSIS — Z933 Colostomy status: Secondary | ICD-10-CM

## 2012-04-09 DIAGNOSIS — E118 Type 2 diabetes mellitus with unspecified complications: Secondary | ICD-10-CM | POA: Diagnosis present

## 2012-04-09 DIAGNOSIS — Z79899 Other long term (current) drug therapy: Secondary | ICD-10-CM

## 2012-04-09 DIAGNOSIS — R112 Nausea with vomiting, unspecified: Secondary | ICD-10-CM | POA: Diagnosis present

## 2012-04-09 DIAGNOSIS — K9409 Other complications of colostomy: Secondary | ICD-10-CM

## 2012-04-09 DIAGNOSIS — IMO0002 Reserved for concepts with insufficient information to code with codable children: Secondary | ICD-10-CM

## 2012-04-09 LAB — URINALYSIS, ROUTINE W REFLEX MICROSCOPIC
Glucose, UA: NEGATIVE mg/dL
Ketones, ur: 15 mg/dL — AB
Nitrite: NEGATIVE
Protein, ur: 30 mg/dL — AB
Specific Gravity, Urine: 1.031 — ABNORMAL HIGH (ref 1.005–1.030)
Urobilinogen, UA: 1 mg/dL (ref 0.0–1.0)
pH: 5 (ref 5.0–8.0)

## 2012-04-09 LAB — CBC
HCT: 45.8 % (ref 36.0–46.0)
Hemoglobin: 15.2 g/dL — ABNORMAL HIGH (ref 12.0–15.0)
MCH: 30 pg (ref 26.0–34.0)
MCHC: 33.2 g/dL (ref 30.0–36.0)
MCV: 90.5 fL (ref 78.0–100.0)
Platelets: 253 10*3/uL (ref 150–400)
RBC: 5.06 MIL/uL (ref 3.87–5.11)
RDW: 14 % (ref 11.5–15.5)
WBC: 6.2 10*3/uL (ref 4.0–10.5)

## 2012-04-09 LAB — COMPREHENSIVE METABOLIC PANEL
ALT: 8 U/L (ref 0–35)
AST: 17 U/L (ref 0–37)
Albumin: 3.7 g/dL (ref 3.5–5.2)
Alkaline Phosphatase: 95 U/L (ref 39–117)
BUN: 22 mg/dL (ref 6–23)
CO2: 21 mEq/L (ref 19–32)
Calcium: 9.4 mg/dL (ref 8.4–10.5)
Chloride: 102 mEq/L (ref 96–112)
Creatinine, Ser: 1.4 mg/dL — ABNORMAL HIGH (ref 0.50–1.10)
GFR calc Af Amer: 41 mL/min — ABNORMAL LOW (ref >90)
GFR calc non Af Amer: 35 mL/min — ABNORMAL LOW (ref >90)
Glucose, Bld: 155 mg/dL — ABNORMAL HIGH (ref 70–99)
Potassium: 4 mEq/L (ref 3.5–5.1)
Sodium: 140 mEq/L (ref 135–145)
Total Bilirubin: 0.5 mg/dL (ref 0.3–1.2)
Total Protein: 7.7 g/dL (ref 6.0–8.3)

## 2012-04-09 LAB — GLUCOSE, CAPILLARY

## 2012-04-09 LAB — URINE MICROSCOPIC-ADD ON

## 2012-04-09 LAB — LIPASE, BLOOD: Lipase: 26 U/L (ref 11–59)

## 2012-04-09 MED ORDER — ONDANSETRON HCL 4 MG PO TABS: 4.0000 mg | ORAL_TABLET | Freq: Four times a day (QID) | ORAL | Status: DC | PRN

## 2012-04-09 MED ORDER — ACETAMINOPHEN 650 MG RE SUPP: 650.0000 mg | Freq: Four times a day (QID) | RECTAL | Status: DC | PRN

## 2012-04-09 MED ORDER — INSULIN ASPART 100 UNIT/ML ~~LOC~~ SOLN: 0.0000 [IU] | Freq: Three times a day (TID) | SUBCUTANEOUS | Status: DC

## 2012-04-09 MED ORDER — IOHEXOL 300 MG/ML  SOLN
100.0000 mL | Freq: Once | INTRAMUSCULAR | Status: AC | PRN
  Administered 2012-04-09: 100 mL via INTRAVENOUS

## 2012-04-09 MED ORDER — ENOXAPARIN SODIUM 40 MG/0.4ML ~~LOC~~ SOLN
40.0000 mg | SUBCUTANEOUS | Status: DC
  Administered 2012-04-09 – 2012-04-11 (×3): 40 mg via SUBCUTANEOUS
  Filled 2012-04-09 (×4): qty 0.4

## 2012-04-09 MED ORDER — SODIUM CHLORIDE 0.9 % IV BOLUS (SEPSIS)
1000.0000 mL | Freq: Once | INTRAVENOUS | Status: AC
  Administered 2012-04-09: 1000 mL via INTRAVENOUS

## 2012-04-09 MED ORDER — DEXTROSE 5 % IV SOLN
1.0000 g | Freq: Once | INTRAVENOUS | Status: AC
  Administered 2012-04-09: 1 g via INTRAVENOUS
  Filled 2012-04-09: qty 10

## 2012-04-09 MED ORDER — IOHEXOL 300 MG/ML  SOLN
50.0000 mL | Freq: Once | INTRAMUSCULAR | Status: AC | PRN
  Administered 2012-04-09: 50 mL via ORAL

## 2012-04-09 MED ORDER — DONEPEZIL HCL 5 MG PO TABS
5.0000 mg | ORAL_TABLET | Freq: Every day | ORAL | Status: DC
  Administered 2012-04-09 – 2012-04-12 (×3): 5 mg via ORAL
  Filled 2012-04-09 (×5): qty 1

## 2012-04-09 MED ORDER — ONDANSETRON HCL 4 MG/2ML IJ SOLN: 4.0000 mg | Freq: Four times a day (QID) | INTRAMUSCULAR | Status: DC | PRN

## 2012-04-09 MED ORDER — DEXTROSE 5 % IV SOLN
1.0000 g | INTRAVENOUS | Status: DC
  Administered 2012-04-10 – 2012-04-11 (×2): 1 g via INTRAVENOUS
  Filled 2012-04-09 (×2): qty 10

## 2012-04-09 MED ORDER — ACETAMINOPHEN 325 MG PO TABS: 650.0000 mg | ORAL_TABLET | Freq: Four times a day (QID) | ORAL | Status: DC | PRN

## 2012-04-09 MED ORDER — ONDANSETRON HCL 4 MG/2ML IJ SOLN
4.0000 mg | Freq: Once | INTRAMUSCULAR | Status: AC
  Administered 2012-04-09: 4 mg via INTRAVENOUS
  Filled 2012-04-09: qty 2

## 2012-04-09 MED ORDER — MORPHINE SULFATE 4 MG/ML IJ SOLN
4.0000 mg | Freq: Once | INTRAMUSCULAR | Status: AC
  Administered 2012-04-09: 4 mg via INTRAVENOUS
  Filled 2012-04-09: qty 1

## 2012-04-09 NOTE — ED Notes (Signed)
Called report to Coleman, RN unit 6N.

## 2012-04-09 NOTE — H&P (Signed)
[facility] History and Physical  [name] [id number] DOB: 1933/05/11 DOA: 04/09/2012   PCP: Kandice Hams, MD    Chief Complaint: nausea and vomiting.   HPI: [name] is a 79 y.o. female with prior h/o DM, asthma, prior episodes of sbo, s/p colostomy , came in for worsening nasuea and vomiting, not associated with abdominal pain or fever or diarrhea. On arrival to ED , she underwent a CT scan showed partial SBO. She is being admitted to medical service for further evaluation.    Review of Systems: The patient denies anorexia, fever, weight loss,, vision loss, decreased hearing, hoarseness, chest pain, syncope, dyspnea on exertion, peripheral edema, balance deficits, hemoptysis, abdominal pain, melena, hematochezia, severe indigestion/heartburn, hematuria, incontinence, genital sores, muscle weakness, suspicious skin lesions, transient blindness, difficulty walking, depression, unusual weight change, abnormal bleeding, enlarged lymph nodes, angioedema, and breast masses.    Past Medical History  Diagnosis Date  . Diabetes mellitus   . Asthma    History reviewed. No pertinent past surgical history. Social History:  reports that she has never smoked. She does not have any smokeless tobacco history on file. She reports that she does not drink alcohol. Her drug history not on file.  where does patient live--home,.   No Known Allergies  No family history on file.   Prior to Admission medications   Medication Sig Start Date End Date Taking? Authorizing Provider  alendronate (FOSAMAX) 70 MG tablet Take 70 mg by mouth every 7 (seven) days. Take with a full glass of water on an empty stomach.   Yes Historical Provider, MD  donepezil (ARICEPT) 5 MG tablet Take 5 mg by mouth at bedtime.   Yes Historical Provider, MD  glipiZIDE (GLUCOTROL XL) 5 MG 24 hr tablet Take 5 mg by mouth daily.     Yes Historical Provider, MD  probenecid (BENEMID) 500 MG tablet Take 250 mg by mouth  daily.    Yes Historical Provider, MD   Physical Exam: Filed Vitals:   04/09/12 1500 04/09/12 1515 04/09/12 1530 04/09/12 1622  BP: 119/69 125/59  126/60  Pulse: 68 66 62 57  Temp:      TempSrc:      Resp:    18  SpO2: 98% 99% 96% 100%    Constitutional: Vital signs reviewed.  Patient is a well-developed and well-nourished  in no acute distress and cooperative with exam. Alert and oriented x3.  Head: Normocephalic and atraumatic Mouth: no erythema or exudates, MMM Eyes: PERRL, EOMI, conjunctivae normal, No scleral icterus.  Neck: Supple, Trachea midline normal ROM, No JVD, mass, thyromegaly, or carotid bruit present.  Cardiovascular: RRR, S1 normal, S2 normal, no MRG, pulses symmetric and intact bilaterally Pulmonary/Chest: CTAB, no wheezes, rales, or rhonchi Abdominal: Soft. Non-tender, non-distended, bowel sounds are normal, no masses, organomegaly, . Colostomy bag in place.  Musculoskeletal: No joint deformities, erythema, or stiffness, ROM full and no nontender Neurological: A&O x3, Strength is normal and symmetric bilaterally, cranial nerve II-XII are grossly intact, no focal motor deficit, sensory intact to light touch bilaterally.  Skin: Warm, dry and intact. No rash, cyanosis, or clubbing.  Psychiatric: Normal mood and affect. speech and behavior is normal.  Labs on Admission:  Basic Metabolic Panel:  Lab [id number] 0925  NA 140  K 4.0  CL 102  CO2 21  GLUCOSE 155*  BUN 22  CREATININE 1.40*  CALCIUM 9.4  MG --  PHOS --   Liver Function Tests:  Lab 04/09/12 [id number]  AST 17  ALT 8  ALKPHOS 95  BILITOT 0.5  PROT 7.7  ALBUMIN 3.7    Lab 04/09/12 0925  LIPASE 26  AMYLASE --   No results found for this basename: AMMONIA:5 in the last 168 hours CBC:  Lab 04/09/12 0925  WBC 6.2  NEUTROABS --  HGB 15.2*  HCT 45.8  MCV 90.5  PLT 253   Cardiac Enzymes: No results found for this basename: CKTOTAL:5,CKMB:5,CKMBINDEX:5,TROPONINI:5 in the last 168 hours  BNP  (last 3 results) No results found for this basename: PROBNP:3 in the last 8760 hours CBG: No results found for this basename: GLUCAP:5 in the last 168 hours  Radiological Exams on Admission: Ct Abdomen Pelvis W Contrast  04/09/2012  **ADDENDUM** CREATED: 04/09/2012 15:12:41  The CONTRAST section below should have stated:  100 ml Omnipaque- 300 IV.  **END ADDENDUM** SIGNED BY: Deniece Portela, M.D.   04/09/2012  *RADIOLOGY REPORT*  Clinical Data: 2-day history of left lower quadrant abdominal pain and back pain.  Nausea and vomiting.  Diarrhea.  Descending colostomy  CT ABDOMEN AND PELVIS WITH CONTRAST  Technique:  Multidetector CT imaging of the abdomen and pelvis was performed following the standard protocol during bolus administration of intravenous contrast.  Contrast:  Comparison: CT abdomen and pelvis 09/01/2011, 03/15/2011, 03/18/2005, 03/14/2005.  Findings: Descending colostomy with parastomal hernia containing normal appearing small bowel.  Decompressed Hartmann pouch. Remaining colon normal in appearance.  Dilation of several loops of small bowel in the mid abdomen and left upper pelvis; the transition point appears to be in the upper pelvis, and is not related to the parastomal hernia.  A segment of small bowel in the midline upper pelvis demonstrate mild wall thickening, though again, this does not appear to be the site of obstruction.  No associated free intraperitoneal fluid.  No free intraperitoneal air.  Borderline hiatal hernia, unchanged.  Stomach normal in appearance.  Diverticulosis involving the distal ileum.  Lipoma involving the ileocecal valve.  Appendix not clearly visualized but no pericecal inflammation.  Normal appearing liver, spleen, and adrenal glands.  Mild pancreatic ductal dilation up to 4 mm without a discrete pancreatic mass and the duct has increased in caliber since the June, 2013 exam.  No biliary ductal dilation.  Small gallstones within the gallbladder.  No CT  evidence for acute cholecystitis.  No visible bile duct stone. Mild cortical thinning involving both kidneys, consistent with age; no significant abnormalities involving either kidney.   Moderate aorto-iliac atherosclerosis without aneurysm. No significant lymphadenopathy.  Uterus surgically absent.  No adnexal masses or free pelvic fluid. Urinary bladder decompressed and unremarkable.  Numerous pelvic phleboliths.  Bone window images demonstrate prior L3-L5 fusion, degenerative changes throughout the lower thoracic and lumbar spine, degenerative changes in both sacroiliac joints and both hips, and generalized osseous demineralization.  Patchy airspace opacities in the visualized deep right lower lobe with associated dense mass- like consolidation along the minor fissure.  IMPRESSION:  1.  Partial small bowel obstruction.  Etiology is felt to most likely be an adhesion in the upper pelvis. 2.  Descending colostomy with parastomal hernia containing normal appearing small bowel.  This is not the site of obstruction. 3.  Possible focal ileitis involving a loop of proximal ileum in the upper pelvis. 4.  Terminal ileal diverticulosis without evidence of acute diverticulitis. 5.  Cholelithiasis without evidence of acute cholecystitis. 6.  Right lower lobe atelectasis versus pneumonia. 7.  Mild pancreatic ductal dilation, increased since June, 2013, without evidence  of a discrete pancreatic mass.   Original Report Authenticated By: Evangeline Dakin, M.D.       Assessment/Plan Active Problems:  Partial SBO  Diabetes mellitus  Nausea & vomiting   1. Partial SBO:  - Admit to medsurg. - clear liquid diet. - will repeat X RAY IN AM - antiemetics for vomiting.   2. Diabetes Mellitus:  - resume SSI.  3. Asthma; no wheezing. Stable.   4. DVT prophylaxis;   5. Abnormal UA: urine cultures are pending , continue with rocephin.   Code Status: full code Family Communication: none at bedside Disposition Plan:  2 - 3 days.    [facility] Pager [phone]  If 7PM-7AM, please contact night-coverage [email or web address] Password [facility] 04/09/2012, 4:32 PM

## 2012-04-09 NOTE — ED Notes (Addendum)
Pt reporting back and LLQ abdominal pain x 2 days. States vomiting, diarrhea last time this morning at 0500. Pt ambulatory. Pt a & o x 4. Pt has colostomy, denies problems.

## 2012-04-09 NOTE — ED Provider Notes (Signed)
History    79 year old female with nausea, vomiting and diarrhea since Thursday. Crampy abdominal pain and upper back pain. Waxes and wanes. No appreciable exacerbating relieving factors. No urinary complaints. No fevers or chills. Patient is status post colostomy. She had this approximately 15 years ago. Patient is not clear about the specifics or the exact indication for this. She's been having brown watery stool for the past several days. She has not noticed any blood in it. No sick contacts.   CSN: [id number]  Arrival date & time 04/09/12  [id number]   First MD Initiated Contact with Patient 04/09/12 [id number]      Chief Complaint  Patient presents with  . Back Pain  . Abdominal Pain    (Consider location/radiation/quality/duration/timing/severity/associated sxs/prior treatment) HPI  Past Medical History  Diagnosis Date  . Diabetes mellitus   . Asthma     History reviewed. No pertinent past surgical history.  No family history on file.  History  Substance Use Topics  . Smoking status: Never Smoker   . Smokeless tobacco: Not on file  . Alcohol Use: No    OB History    Grav Para Term Preterm Abortions TAB SAB Ect Mult Living                  Review of Systems  All systems reviewed and negative, other than as noted in HPI.   Allergies  Review of patient's allergies indicates no known allergies.  Home Medications   Current Outpatient Rx  Name  Route  Sig  Dispense  Refill  . ALENDRONATE SODIUM 70 MG PO TABS   Oral   Take 70 mg by mouth every 7 (seven) days. Take with a full glass of water on an empty stomach.         . DONEPEZIL HCL 5 MG PO TABS   Oral   Take 5 mg by mouth at bedtime.         Marland Kitchen GLIPIZIDE ER 5 MG PO TB24   Oral   Take 5 mg by mouth daily.           Marland Kitchen PROBENECID 500 MG PO TABS   Oral   Take 250 mg by mouth daily.            BP 141/78  Pulse 84  Temp 97.7 F (36.5 C) (Oral)  Resp 20  SpO2 98%  Physical Exam  Nursing note and  vitals reviewed. Constitutional: She appears well-developed and well-nourished. No distress.  HENT:  Head: Normocephalic and atraumatic.  Eyes: Conjunctivae normal are normal. Right eye exhibits no discharge. Left eye exhibits no discharge.  Neck: Neck supple.  Cardiovascular: Normal rate, regular rhythm and normal heart sounds.  Exam reveals no gallop and no friction rub.   No murmur heard. Pulmonary/Chest: Effort normal and breath sounds normal. No respiratory distress.  Abdominal: Soft. She exhibits no distension. There is tenderness.       Abdomen with well-healed surgical scars. Bag with brown liquid stool. Ostomy is pink and puckered.  Musculoskeletal: She exhibits no edema and no tenderness.  Neurological: She is alert.  Skin: Skin is warm and dry.  Psychiatric: She has a normal mood and affect. Her behavior is normal. Thought content normal.    ED Course  Procedures (including critical care time)  Labs Reviewed  CBC - Abnormal; Notable for the following:    Hemoglobin 15.2 (*)     All other components within normal limits  COMPREHENSIVE METABOLIC PANEL -  Abnormal; Notable for the following:    Glucose, Bld 155 (*)     Creatinine, Ser 1.40 (*)     GFR calc non Af Amer 35 (*)     GFR calc Af Amer 41 (*)     All other components within normal limits  URINALYSIS, ROUTINE W REFLEX MICROSCOPIC - Abnormal; Notable for the following:    Color, Urine AMBER (*)  BIOCHEMICALS MAY BE AFFECTED BY COLOR   APPearance CLOUDY (*)     Specific Gravity, Urine 1.031 (*)     Hgb urine dipstick SMALL (*)     Bilirubin Urine MODERATE (*)     Ketones, ur 15 (*)     Protein, ur 30 (*)     Leukocytes, UA MODERATE (*)     All other components within normal limits  URINE MICROSCOPIC-ADD ON - Abnormal; Notable for the following:    Squamous Epithelial / LPF MANY (*)     Bacteria, UA MANY (*)     Casts GRANULAR CAST (*)  HYALINE CASTS   All other components within normal limits  LIPASE, BLOOD    URINE CULTURE   No results found.   1. Small bowel obstruction, partial   2. Renal insufficiency   3. Colostomy hernia   4. Diabetes mellitus   5. Nausea & vomiting       MDM  79 year old female with abdominal pain and nausea and vomiting. CT is significant for a partial small bowel obstruction. Mild acute renal insufficiency. Discussed with hospitalist for admission.       Virgel Manifold, MD 04/10/12 [phone]

## 2012-04-09 NOTE — ED Notes (Signed)
Contrast given to patient by radiology.

## 2012-04-10 ENCOUNTER — Observation Stay (HOSPITAL_COMMUNITY): Payer: Medicare Other

## 2012-04-10 LAB — BASIC METABOLIC PANEL
BUN: 14 mg/dL (ref 6–23)
CO2: 24 mEq/L (ref 19–32)
Chloride: 103 mEq/L (ref 96–112)
Creatinine, Ser: 0.84 mg/dL (ref 0.50–1.10)
GFR calc Af Amer: 76 mL/min — ABNORMAL LOW (ref >90)
Potassium: 3.9 mEq/L (ref 3.5–5.1)

## 2012-04-10 LAB — GLUCOSE, CAPILLARY
Glucose-Capillary: 106 mg/dL — ABNORMAL HIGH (ref 70–99)
Glucose-Capillary: 84 mg/dL (ref 70–99)

## 2012-04-10 LAB — URINE CULTURE: Colony Count: 55000

## 2012-04-10 LAB — CBC
HCT: 39.8 % (ref 36.0–46.0)
Hemoglobin: 12.8 g/dL (ref 12.0–15.0)
MCHC: 32.2 g/dL (ref 30.0–36.0)
MCV: 91.5 fL (ref 78.0–100.0)
RDW: 14 % (ref 11.5–15.5)

## 2012-04-10 MED ORDER — POTASSIUM CHLORIDE IN NACL 20-0.45 MEQ/L-% IV SOLN
INTRAVENOUS | Status: DC
  Administered 2012-04-10 – 2012-04-12 (×3): via INTRAVENOUS
  Filled 2012-04-10 (×5): qty 1000

## 2012-04-10 NOTE — Progress Notes (Signed)
Subjective: Did well last pm abdomin feels better.Does have cough clear sputum  Objective: Vital signs in last 24 hours: Temp:  [97.7 F (36.5 C)-98.5 F (36.9 C)] 98.3 F (36.8 C) (01/26 0601) Pulse Rate:  [57-118] 60  (01/26 0601) Resp:  [17-20] 17  (01/26 0601) BP: (92-143)/(57-93) 139/66 mmHg (01/26 0601) SpO2:  [96 %-100 %] 98 % (01/26 0601) Weight:  [59.104 kg (130 lb 4.8 oz)] 59.104 kg (130 lb 4.8 oz) (01/25 1750) Weight change:  Last BM Date: 04/09/12  Intake/Output from previous day: 01/25 0701 - 01/26 0700 In: -  Out: 1 [Stool:1] Intake/Output this shift:    Resp: clear to auscultation bilaterally Cardio: regular rate and rhythm, S1, S2 normal, no murmur, click, rub or gallop GI: bowel sounds active, soft small amount of loose stool in colostomy bag  Lab Results:  [facility] 04/09/12 0925  WBC 6.2  HGB 15.2*  HCT 45.8  PLT 253   BMET  Basename 04/09/12 0925  NA 140  K 4.0  CL 102  CO2 21  GLUCOSE 155*  BUN 22  CREATININE 1.40*  CALCIUM 9.4    Studies/Results: Ct Abdomen Pelvis W Contrast  04/09/2012  **ADDENDUM** CREATED: 04/09/2012 15:12:41  The CONTRAST section below should have stated:  100 ml Omnipaque- 300 IV.  **END ADDENDUM** SIGNED BY: Deniece Portela, M.D.   04/09/2012  *RADIOLOGY REPORT*  Clinical Data: 2-day history of left lower quadrant abdominal pain and back pain.  Nausea and vomiting.  Diarrhea.  Descending colostomy  CT ABDOMEN AND PELVIS WITH CONTRAST  Technique:  Multidetector CT imaging of the abdomen and pelvis was performed following the standard protocol during bolus administration of intravenous contrast.  Contrast:  Comparison: CT abdomen and pelvis 09/01/2011, 03/15/2011, 03/18/2005, 03/14/2005.  Findings: Descending colostomy with parastomal hernia containing normal appearing small bowel.  Decompressed Hartmann pouch. Remaining colon normal in appearance.  Dilation of several loops of small bowel in the mid abdomen and left  upper pelvis; the transition point appears to be in the upper pelvis, and is not related to the parastomal hernia.  A segment of small bowel in the midline upper pelvis demonstrate mild wall thickening, though again, this does not appear to be the site of obstruction.  No associated free intraperitoneal fluid.  No free intraperitoneal air.  Borderline hiatal hernia, unchanged.  Stomach normal in appearance.  Diverticulosis involving the distal ileum.  Lipoma involving the ileocecal valve.  Appendix not clearly visualized but no pericecal inflammation.  Normal appearing liver, spleen, and adrenal glands.  Mild pancreatic ductal dilation up to 4 mm without a discrete pancreatic mass and the duct has increased in caliber since the June, 2013 exam.  No biliary ductal dilation.  Small gallstones within the gallbladder.  No CT evidence for acute cholecystitis.  No visible bile duct stone. Mild cortical thinning involving both kidneys, consistent with age; no significant abnormalities involving either kidney.   Moderate aorto-iliac atherosclerosis without aneurysm. No significant lymphadenopathy.  Uterus surgically absent.  No adnexal masses or free pelvic fluid. Urinary bladder decompressed and unremarkable.  Numerous pelvic phleboliths.  Bone window images demonstrate prior L3-L5 fusion, degenerative changes throughout the lower thoracic and lumbar spine, degenerative changes in both sacroiliac joints and both hips, and generalized osseous demineralization.  Patchy airspace opacities in the visualized deep right lower lobe with associated dense mass- like consolidation along the minor fissure.  IMPRESSION:  1.  Partial small bowel obstruction.  Etiology is felt to most likely be an  adhesion in the upper pelvis. 2.  Descending colostomy with parastomal hernia containing normal appearing small bowel.  This is not the site of obstruction. 3.  Possible focal ileitis involving a loop of proximal ileum in the upper pelvis. 4.   Terminal ileal diverticulosis without evidence of acute diverticulitis. 5.  Cholelithiasis without evidence of acute cholecystitis. 6.  Right lower lobe atelectasis versus pneumonia. 7.  Mild pancreatic ductal dilation, increased since June, 2013, without evidence of a discrete pancreatic mass.   Original Report Authenticated By: Evangeline Dakin, M.D.     Medications:  Scheduled:   . cefTRIAXone (ROCEPHIN)  IV  1 g Intravenous Q24H  . donepezil  5 mg Oral QHS  . enoxaparin (LOVENOX) injection  40 mg Subcutaneous Q24H  . insulin aspart  0-9 Units Subcutaneous TID WC    Assessment/Plan: 1. Partial sbo likely adhesions getting clinically better tolerating clear liquids will check abd film in am 2. Renal disease likely from dehydration will continue iv check bmet in am 3. Dm cbg 93 good control  LOS: 1 day   [facility] 04/10/2012, 8:22 AM

## 2012-04-10 NOTE — Progress Notes (Signed)
UR Completed Chenika Nevils Graves-Bigelow, RN,BSN [phone]  

## 2012-04-10 NOTE — Progress Notes (Signed)
Occupational Therapy Note  OT order received and appreciated.  Per PT report, pt has no acute OT needs.  Will sign off at this time. Please re-order if needed.   04/10/2012 Darrol Jump OTR/L Pager [phone] Office [phone]

## 2012-04-10 NOTE — Progress Notes (Signed)
Physical Therapy Discharge Patient Details Name: [name] MRN: [id number] DOB: 1933-05-05 Today's Date: 04/10/2012 Time: [id number] PT Time Calculation (min): 4 min  Patient discharged from PT services secondary to no functional mobility needs at this time. Pt up independently, denies any problems, worries or concerns about mobility.  Signing off case.  Thank you.    GP     Herbie Drape 04/10/2012, 4:00 PM

## 2012-04-10 NOTE — Care Management Note (Unsigned)
    Page 1 of 1   04/10/2012     3:20:29 PM   CARE MANAGEMENT NOTE 04/10/2012  Patient:  [name], [name]   Account Number:  [id number]  Date Initiated:  04/10/2012  Documentation initiated by:  GRAVES-BIGELOW,Klein Willcox  Subjective/Objective Assessment:   Pt admitted with SBO.     Action/Plan:   CM will continue to monitor for disposition needs.   Anticipated DC Date:  04/13/2012   Anticipated DC Plan:  [place]  CM consult      Choice offered to / List presented to:             Status of service:  In process, will continue to follow Medicare Important Message given?   (If response is "NO", the following Medicare IM given date fields will be blank) Date Medicare IM given:   Date Additional Medicare IM given:    Discharge Disposition:    Per UR Regulation:  Reviewed for med. necessity/level of care/duration of stay  If discussed at [place] of Stay Meetings, dates discussed:    Comments:

## 2012-04-11 ENCOUNTER — Inpatient Hospital Stay (HOSPITAL_COMMUNITY): Payer: Medicare Other

## 2012-04-11 LAB — BASIC METABOLIC PANEL
CO2: 22 mEq/L (ref 19–32)
Calcium: 8.9 mg/dL (ref 8.4–10.5)
Chloride: 101 mEq/L (ref 96–112)
GFR calc Af Amer: >90 mL/min (ref >90)
Sodium: 134 mEq/L — ABNORMAL LOW (ref 135–145)

## 2012-04-11 MED ORDER — AMLODIPINE BESYLATE 5 MG PO TABS
5.0000 mg | ORAL_TABLET | Freq: Every day | ORAL | Status: DC
  Administered 2012-04-11 – 2012-04-12 (×2): 5 mg via ORAL
  Filled 2012-04-11 (×2): qty 1

## 2012-04-11 NOTE — Progress Notes (Signed)
Rn called md about elevated blood pressure. Orders placed

## 2012-04-11 NOTE — Progress Notes (Signed)
Subjective: Patient stated x-ray this morning, without any complaints, no abdominal pain. Tolerating clear liquids. X-ray reviewed  Objective: Vital signs in last 24 hours: Temp:  [97.7 F (36.5 C)-98.4 F (36.9 C)] 98.1 F (36.7 C) (01/27 0604) Pulse Rate:  [54-61] 54  (01/27 0604) Resp:  [17-18] 17  (01/27 0604) BP: (131-133)/(52-76) 132/59 mmHg (01/27 0604) SpO2:  [97 %-98 %] 97 % (01/27 0604) Weight change:  Last BM Date: 04/10/12  Intake/Output from previous day: 01/26 0701 - 01/27 0700 In: 1201 [P.O.:540; I.V.:661] Out: 1 [Stool:1] Intake/Output this shift:    General appearance: alert and cooperative Resp: clear to auscultation bilaterally Cardio: regular rate and rhythm, S1, S2 normal, no murmur, click, rub or gallop GI: soft, non-tender; bowel sounds normal; no masses,  no organomegaly  Lab Results:  Results for orders placed during the hospital encounter of 04/09/12 (from the past 24 hour(s))  GLUCOSE, CAPILLARY     Status: Normal   Collection Time   04/10/12  5:09 PM      Component Value Range   Glucose-Capillary 84  70 - 99 mg/dL   Comment 1 Notify RN    GLUCOSE, CAPILLARY     Status: Abnormal   Collection Time   04/10/12 10:23 PM      Component Value Range   Glucose-Capillary 114 (*) 70 - 99 mg/dL   Comment 1 Notify RN     Comment 2 Documented in Chart    BASIC METABOLIC PANEL     Status: Abnormal   Collection Time   04/11/12  6:05 AM      Component Value Range   Sodium 134 (*) 135 - 145 mEq/L   Potassium 3.8  3.5 - 5.1 mEq/L   Chloride 101  96 - 112 mEq/L   CO2 22  19 - 32 mEq/L   Glucose, Bld 116 (*) 70 - 99 mg/dL   BUN 8  6 - 23 mg/dL   Creatinine, Ser 0.75  0.50 - 1.10 mg/dL   Calcium 8.9  8.4 - 10.5 mg/dL   GFR calc non Af Amer 80 (*) >90 mL/min   GFR calc Af Amer >90  >90 mL/min  GLUCOSE, CAPILLARY     Status: Abnormal   Collection Time   04/11/12  8:37 AM      Component Value Range   Glucose-Capillary 112 (*) 70 - 99 mg/dL  GLUCOSE,  CAPILLARY     Status: Abnormal   Collection Time   04/11/12 11:41 AM      Component Value Range   Glucose-Capillary 110 (*) 70 - 99 mg/dL      Studies/Results: Dg Abd 1 View  04/11/2012  *RADIOLOGY REPORT*  Clinical Data: Follow-up for small bowel obstruction.  ABDOMEN - 1 VIEW  Comparison: Abdominal radiograph 04/10/2012.  Findings: Gas, stool and oral contrast material is noted within the colon.  Some gas and stool are noted in the rectum.  No pathologic distension of small bowel.  Postoperative changes of posterior fixation are noted in the lower lumbar spine.  No gross evidence of pneumoperitoneum.  Opacity projecting over the left hemipelvis is compatible with the patient's stoma and parastomal hernia.  IMPRESSION: 1.  Nonobstructive bowel gas pattern, as above.   Original Report Authenticated By: Vinnie Langton, M.D.    Ct Abdomen Pelvis W Contrast  04/09/2012  **ADDENDUM** CREATED: 04/09/2012 15:12:41  The CONTRAST section below should have stated:  100 ml Omnipaque- 300 IV.  **END ADDENDUM** SIGNED BY: Deniece Portela, M.D.  04/09/2012  *RADIOLOGY REPORT*  Clinical Data: 2-day history of left lower quadrant abdominal pain and back pain.  Nausea and vomiting.  Diarrhea.  Descending colostomy  CT ABDOMEN AND PELVIS WITH CONTRAST  Technique:  Multidetector CT imaging of the abdomen and pelvis was performed following the standard protocol during bolus administration of intravenous contrast.  Contrast:  Comparison: CT abdomen and pelvis 09/01/2011, 03/15/2011, 03/18/2005, 03/14/2005.  Findings: Descending colostomy with parastomal hernia containing normal appearing small bowel.  Decompressed Hartmann pouch. Remaining colon normal in appearance.  Dilation of several loops of small bowel in the mid abdomen and left upper pelvis; the transition point appears to be in the upper pelvis, and is not related to the parastomal hernia.  A segment of small bowel in the midline upper pelvis demonstrate mild  wall thickening, though again, this does not appear to be the site of obstruction.  No associated free intraperitoneal fluid.  No free intraperitoneal air.  Borderline hiatal hernia, unchanged.  Stomach normal in appearance.  Diverticulosis involving the distal ileum.  Lipoma involving the ileocecal valve.  Appendix not clearly visualized but no pericecal inflammation.  Normal appearing liver, spleen, and adrenal glands.  Mild pancreatic ductal dilation up to 4 mm without a discrete pancreatic mass and the duct has increased in caliber since the June, 2013 exam.  No biliary ductal dilation.  Small gallstones within the gallbladder.  No CT evidence for acute cholecystitis.  No visible bile duct stone. Mild cortical thinning involving both kidneys, consistent with age; no significant abnormalities involving either kidney.   Moderate aorto-iliac atherosclerosis without aneurysm. No significant lymphadenopathy.  Uterus surgically absent.  No adnexal masses or free pelvic fluid. Urinary bladder decompressed and unremarkable.  Numerous pelvic phleboliths.  Bone window images demonstrate prior L3-L5 fusion, degenerative changes throughout the lower thoracic and lumbar spine, degenerative changes in both sacroiliac joints and both hips, and generalized osseous demineralization.  Patchy airspace opacities in the visualized deep right lower lobe with associated dense mass- like consolidation along the minor fissure.  IMPRESSION:  1.  Partial small bowel obstruction.  Etiology is felt to most likely be an adhesion in the upper pelvis. 2.  Descending colostomy with parastomal hernia containing normal appearing small bowel.  This is not the site of obstruction. 3.  Possible focal ileitis involving a loop of proximal ileum in the upper pelvis. 4.  Terminal ileal diverticulosis without evidence of acute diverticulitis. 5.  Cholelithiasis without evidence of acute cholecystitis. 6.  Right lower lobe atelectasis versus pneumonia. 7.   Mild pancreatic ductal dilation, increased since June, 2013, without evidence of a discrete pancreatic mass.   Original Report Authenticated By: Evangeline Dakin, M.D.    Dg Abd 2 Views  04/10/2012  *RADIOLOGY REPORT*  Clinical Data: Partial small bowel obstruction.  ABDOMEN - 2 VIEW  Comparison: CT on 04/09/2012  Findings: There is transit of oral contrast ingested for CT exam into the colon.  No significant small bowel dilatation is identified.  The visualized colon is normal in caliber.  There is some air in the stomach.  No free air is identified.  IMPRESSION: Resolved small bowel obstruction with no dilated small bowel loops identified.   Original Report Authenticated By: Aletta Edouard, M.D.     Medications:  Prior to Admission:  Prescriptions prior to admission  Medication Sig Dispense Refill  . alendronate (FOSAMAX) 70 MG tablet Take 70 mg by mouth every 7 (seven) days. Take with a full glass of water on an  empty stomach.      . donepezil (ARICEPT) 5 MG tablet Take 5 mg by mouth at bedtime.      Marland Kitchen glipiZIDE (GLUCOTROL XL) 5 MG 24 hr tablet Take 5 mg by mouth daily.        . probenecid (BENEMID) 500 MG tablet Take 250 mg by mouth daily.        Scheduled:   . cefTRIAXone (ROCEPHIN)  IV  1 g Intravenous Q24H  . donepezil  5 mg Oral QHS  . enoxaparin (LOVENOX) injection  40 mg Subcutaneous Q24H  . insulin aspart  0-9 Units Subcutaneous TID WC   Continuous:   . 0.45 % NaCl with KCl 20 mEq / L 50 mL/hr at 04/11/12 [id number]    Assessment/Plan: Clinically resolved partial small bowel obstruction, advanced diet. Disposition pending tolerance of diet. Abnormal x-ray suggesting atelectasis versus pneumonia, check followup l x-ray, if continued infiltrate change antibiotics to p.o. However patient without complaints of fever chills or productive cough. Apparently antibiotics were started for abnormal UA, that final culture did not show a UTI Diabetes   LOS: 2 days   Wrenly Lauritsen  D 04/11/2012, 12:45 PM

## 2012-04-12 LAB — GLUCOSE, CAPILLARY: Glucose-Capillary: 117 mg/dL — ABNORMAL HIGH (ref 70–99)

## 2012-04-12 MED ORDER — AMLODIPINE BESYLATE 5 MG PO TABS: 5.0000 mg | ORAL_TABLET | Freq: Every day | ORAL | Status: DC

## 2012-04-12 NOTE — Progress Notes (Signed)
D/C instructions done with Pt. She verbalized understanding and with pick up her script from [facility].

## 2012-04-12 NOTE — Discharge Summary (Signed)
Physician Discharge Summary  Patient ID: [name] MRN: [id number] DOB/AGE: 11/11/1933 79 y.o.  Admit date: 04/09/2012 Discharge date: 04/12/2012  Admission Diagnoses:  Discharge Diagnoses:  Active Problems:  Partial SBO  Diabetes mellitus  Nausea & vomiting   Discharged Condition: stable  Hospital Course:   Patient presented to the hospital for evaluation with complaint of nausea and vomiting. Evaluation in the emergency room revealed partial small bowel obstruction. Patient has a history of abdominal surgery it was felt that her obstruction was more likely secondary to adhesion. Admission was deemed necessary for further evaluation and treatment. Patient was placed on bowel rest, serial imaging of her abdomen was obtained. Patient had resolution of bowel obstruction symptoms. There was no nausea no vomiting no abdominal pain. She had good output from her ostomy. An initial UA looked abnormal however final culture was negative, initial x-ray was questionable infiltrate or atelectasis. Followup x-ray was consistent with atelectasis. Currently patient is tolerating a regular diet. She's felt to be stable for discharge to home  Consults:    Significant Diagnostic Studies:Dg Chest 1 View  04/11/2012  *RADIOLOGY REPORT*  Clinical Data: Cough and shortness of breath.  CHEST - 1 VIEW  Comparison: Chest x-ray 01/21/2007.  Findings: The heart is mildly enlarged but stable.  There is tortuosity and ectasia of the thoracic aorta.  Lungs are clear except for basilar scarring, right greater than left.  No pleural effusion.  The bony thorax is intact.  IMPRESSION: Streaky basilar atelectasis. No infiltrates or effusions.   Original Report Authenticated By: Marijo Sanes, M.D.    Dg Abd 1 View  04/11/2012  *RADIOLOGY REPORT*  Clinical Data: Follow-up for small bowel obstruction.  ABDOMEN - 1 VIEW  Comparison: Abdominal radiograph 04/10/2012.  Findings: Gas, stool and oral contrast material is noted  within the colon.  Some gas and stool are noted in the rectum.  No pathologic distension of small bowel.  Postoperative changes of posterior fixation are noted in the lower lumbar spine.  No gross evidence of pneumoperitoneum.  Opacity projecting over the left hemipelvis is compatible with the patient's stoma and parastomal hernia.  IMPRESSION: 1.  Nonobstructive bowel gas pattern, as above.   Original Report Authenticated By: Vinnie Langton, M.D.    Ct Abdomen Pelvis W Contrast  04/09/2012  **ADDENDUM** CREATED: 04/09/2012 15:12:41  The CONTRAST section below should have stated:  100 ml Omnipaque- 300 IV.  **END ADDENDUM** SIGNED BY: Deniece Portela, M.D.   04/09/2012  *RADIOLOGY REPORT*  Clinical Data: 2-day history of left lower quadrant abdominal pain and back pain.  Nausea and vomiting.  Diarrhea.  Descending colostomy  CT ABDOMEN AND PELVIS WITH CONTRAST  Technique:  Multidetector CT imaging of the abdomen and pelvis was performed following the standard protocol during bolus administration of intravenous contrast.  Contrast:  Comparison: CT abdomen and pelvis 09/01/2011, 03/15/2011, 03/18/2005, 03/14/2005.  Findings: Descending colostomy with parastomal hernia containing normal appearing small bowel.  Decompressed Hartmann pouch. Remaining colon normal in appearance.  Dilation of several loops of small bowel in the mid abdomen and left upper pelvis; the transition point appears to be in the upper pelvis, and is not related to the parastomal hernia.  A segment of small bowel in the midline upper pelvis demonstrate mild wall thickening, though again, this does not appear to be the site of obstruction.  No associated free intraperitoneal fluid.  No free intraperitoneal air.  Borderline hiatal hernia, unchanged.  Stomach normal in appearance.  Diverticulosis involving  the distal ileum.  Lipoma involving the ileocecal valve.  Appendix not clearly visualized but no pericecal inflammation.  Normal appearing  liver, spleen, and adrenal glands.  Mild pancreatic ductal dilation up to 4 mm without a discrete pancreatic mass and the duct has increased in caliber since the June, 2013 exam.  No biliary ductal dilation.  Small gallstones within the gallbladder.  No CT evidence for acute cholecystitis.  No visible bile duct stone. Mild cortical thinning involving both kidneys, consistent with age; no significant abnormalities involving either kidney.   Moderate aorto-iliac atherosclerosis without aneurysm. No significant lymphadenopathy.  Uterus surgically absent.  No adnexal masses or free pelvic fluid. Urinary bladder decompressed and unremarkable.  Numerous pelvic phleboliths.  Bone window images demonstrate prior L3-L5 fusion, degenerative changes throughout the lower thoracic and lumbar spine, degenerative changes in both sacroiliac joints and both hips, and generalized osseous demineralization.  Patchy airspace opacities in the visualized deep right lower lobe with associated dense mass- like consolidation along the minor fissure.  IMPRESSION:  1.  Partial small bowel obstruction.  Etiology is felt to most likely be an adhesion in the upper pelvis. 2.  Descending colostomy with parastomal hernia containing normal appearing small bowel.  This is not the site of obstruction. 3.  Possible focal ileitis involving a loop of proximal ileum in the upper pelvis. 4.  Terminal ileal diverticulosis without evidence of acute diverticulitis. 5.  Cholelithiasis without evidence of acute cholecystitis. 6.  Right lower lobe atelectasis versus pneumonia. 7.  Mild pancreatic ductal dilation, increased since June, 2013, without evidence of a discrete pancreatic mass.   Original Report Authenticated By: Evangeline Dakin, M.D.    Dg Abd 2 Views  04/10/2012  *RADIOLOGY REPORT*  Clinical Data: Partial small bowel obstruction.  ABDOMEN - 2 VIEW  Comparison: CT on 04/09/2012  Findings: There is transit of oral contrast ingested for CT exam into  the colon.  No significant small bowel dilatation is identified.  The visualized colon is normal in caliber.  There is some air in the stomach.  No free air is identified.  IMPRESSION: Resolved small bowel obstruction with no dilated small bowel loops identified.   Original Report Authenticated By: Aletta Edouard, M.D.       Discharge Exam: Blood pressure 143/69, pulse 58, temperature 97.8 F (36.6 C), temperature source Oral, resp. rate 16, height 4\' 10"  (1.473 m), weight 59.104 kg (130 lb 4.8 oz), SpO2 100.00%. General appearance: alert and cooperative Resp: clear to auscultation bilaterally Cardio: regular rate and rhythm, S1, S2 normal, no murmur, click, rub or gallop GI: soft, positive bowel sounds, good output in her ostomy, no distention Extremities: extremities normal, atraumatic, no cyanosis or edema  Disposition: 01-Home or Self Care  Discharge Orders    Future Appointments: Provider: Department: Dept Phone: Center:   04/22/2012 12:30 PM [facility] [phone] [facility]       Medication List     As of 04/12/2012  9:53 AM    TAKE these medications         alendronate 70 MG tablet   Commonly known as: FOSAMAX   Take 70 mg by mouth every 7 (seven) days. Take with a full glass of water on an empty stomach.      amLODipine 5 MG tablet   Commonly known as: NORVASC   Take 1 tablet (5 mg total) by mouth daily.      donepezil 5 MG tablet  Commonly known as: ARICEPT   Take 5 mg by mouth at bedtime.      glipiZIDE 5 MG 24 hr tablet   Commonly known as: GLUCOTROL XL   Take 5 mg by mouth daily.      probenecid 500 MG tablet   Commonly known as: BENEMID   Take 250 mg by mouth daily.         SignedKandice Hams 04/12/2012, 9:53 AM

## 2012-04-22 ENCOUNTER — Ambulatory Visit: Payer: Medicare Other

## 2012-05-25 ENCOUNTER — Ambulatory Visit
Admission: RE | Admit: 2012-05-25 | Discharge: 2012-05-25 | Disposition: A | Discharge status: Home / Self Care | Payer: Medicare Other | Source: Ambulatory Visit | Attending: Internal Medicine | Admitting: Internal Medicine

## 2012-05-25 DIAGNOSIS — Z1231 Encounter for screening mammogram for malignant neoplasm of breast: Secondary | ICD-10-CM

## 2013-02-16 ENCOUNTER — Emergency Department (HOSPITAL_COMMUNITY)
Admission: EM | Admit: 2013-02-16 | Discharge: 2013-02-17 | Disposition: A | Discharge status: Home / Self Care | Payer: Medicare Other | Attending: Emergency Medicine | Admitting: Emergency Medicine

## 2013-02-16 ENCOUNTER — Encounter (HOSPITAL_COMMUNITY): Payer: Self-pay | Admitting: Emergency Medicine

## 2013-02-16 ENCOUNTER — Emergency Department (HOSPITAL_COMMUNITY): Payer: Medicare Other

## 2013-02-16 DIAGNOSIS — J45909 Unspecified asthma, uncomplicated: Secondary | ICD-10-CM | POA: Insufficient documentation

## 2013-02-16 DIAGNOSIS — E119 Type 2 diabetes mellitus without complications: Secondary | ICD-10-CM | POA: Insufficient documentation

## 2013-02-16 DIAGNOSIS — Z79899 Other long term (current) drug therapy: Secondary | ICD-10-CM | POA: Insufficient documentation

## 2013-02-16 DIAGNOSIS — R112 Nausea with vomiting, unspecified: Secondary | ICD-10-CM | POA: Insufficient documentation

## 2013-02-16 LAB — CBC WITH DIFFERENTIAL/PLATELET
Basophils Absolute: 0 10*3/uL (ref 0.0–0.1)
Basophils Relative: 0 % (ref 0–1)
Eosinophils Absolute: 0 10*3/uL (ref 0.0–0.7)
Eosinophils Relative: 1 % (ref 0–5)
HCT: 42.5 % (ref 36.0–46.0)
Hemoglobin: 14.4 g/dL (ref 12.0–15.0)
Lymphocytes Relative: 11 % — ABNORMAL LOW (ref 12–46)
Lymphs Abs: 0.9 10*3/uL (ref 0.7–4.0)
MCH: 30.9 pg (ref 26.0–34.0)
MCHC: 33.9 g/dL (ref 30.0–36.0)
MCV: 91.2 fL (ref 78.0–100.0)
Monocytes Absolute: 0.5 10*3/uL (ref 0.1–1.0)
Monocytes Relative: 6 % (ref 3–12)
Neutro Abs: 7 10*3/uL (ref 1.7–7.7)
Neutrophils Relative %: 82 % — ABNORMAL HIGH (ref 43–77)
Platelets: 236 10*3/uL (ref 150–400)
RBC: 4.66 MIL/uL (ref 3.87–5.11)
RDW: 13.3 % (ref 11.5–15.5)
WBC: 8.5 10*3/uL (ref 4.0–10.5)

## 2013-02-16 LAB — BASIC METABOLIC PANEL
BUN: 20 mg/dL (ref 6–23)
CO2: 26 mEq/L (ref 19–32)
Calcium: 9.5 mg/dL (ref 8.4–10.5)
Chloride: 103 mEq/L (ref 96–112)
Creatinine, Ser: 0.91 mg/dL (ref 0.50–1.10)
GFR calc Af Amer: 69 mL/min — ABNORMAL LOW (ref >90)
GFR calc non Af Amer: 59 mL/min — ABNORMAL LOW (ref >90)
Glucose, Bld: 151 mg/dL — ABNORMAL HIGH (ref 70–99)
Potassium: 3.5 mEq/L (ref 3.5–5.1)
Sodium: 141 mEq/L (ref 135–145)

## 2013-02-16 MED ORDER — ONDANSETRON HCL 4 MG/2ML IJ SOLN: 4.0000 mg | Freq: Once | INTRAMUSCULAR | Status: DC

## 2013-02-16 MED ORDER — ONDANSETRON HCL 4 MG PO TABS: 4.0000 mg | ORAL_TABLET | Freq: Four times a day (QID) | ORAL | Status: DC

## 2013-02-16 MED ORDER — IBUPROFEN 200 MG PO TABS
600.0000 mg | ORAL_TABLET | Freq: Once | ORAL | Status: AC
  Administered 2013-02-16: 600 mg via ORAL
  Filled 2013-02-16: qty 3

## 2013-02-16 MED ORDER — SODIUM CHLORIDE 0.9 % IV BOLUS (SEPSIS): 1000.0000 mL | Freq: Once | INTRAVENOUS | Status: DC

## 2013-02-16 NOTE — ED Notes (Signed)
Pt st's pain has improved. Denies any pain at this time.  Pt resting with no complaints.

## 2013-02-16 NOTE — ED Notes (Signed)
Pt arrives via EMS c/o abd pain x2 days. Pt has vomited x2 today. Pt states that the last time she had this abd pain she had a blockage. Pt states that her colostomy output has not changed.

## 2013-02-16 NOTE — ED Notes (Addendum)
Janelle, RN and this RN went to clean patient up from bowel movement and noticed the patients bag from her ostomy had fallen off, stoma bare and pink. Angus Palms, RN Tax inspector to retreive new bag for stoma. Chucks placed on patient and dry towel laid over stoma until bag arrives.

## 2013-02-22 NOTE — ED Provider Notes (Signed)
CSN: [id number]     Arrival date & time 02/16/13  2017 History   First MD Initiated Contact with Patient 02/16/13 2022     Chief Complaint  Patient presents with  . Abdominal Pain   (Consider location/radiation/quality/duration/timing/severity/associated sxs/prior Treatment) HPI.  79 year old female with abdominal pain and nausea and vomiting. Symptom onset approximately 2 days ago intermittent. Describes cramping. Can't assess of vomiting today. Nonbilious and nonbloody. Similar type symptoms with previous obstruction. She has not noticed any change in her ostomy output. She does not feel particularly bloated. No fevers or chills. No sick contacts.  Past Medical History  Diagnosis Date  . Diabetes mellitus   . Asthma    History reviewed. No pertinent past surgical history. No family history on file. History  Substance Use Topics  . Smoking status: Never Smoker   . Smokeless tobacco: Not on file  . Alcohol Use: No   OB History   Grav Para Term Preterm Abortions TAB SAB Ect Mult Living                 Review of Systems  All systems reviewed and negative, other than as noted in HPI.   Allergies  Review of patient's allergies indicates no known allergies.  Home Medications   Current Outpatient Rx  Name  Route  Sig  Dispense  Refill  . alendronate (FOSAMAX) 70 MG tablet   Oral   Take 70 mg by mouth every 7 (seven) days. Take with a full glass of water on an empty stomach.         Marland Kitchen amLODipine (NORVASC) 5 MG tablet   Oral   Take 1 tablet (5 mg total) by mouth daily.         Marland Kitchen donepezil (ARICEPT) 5 MG tablet   Oral   Take 5 mg by mouth at bedtime.         Marland Kitchen glipiZIDE (GLUCOTROL XL) 5 MG 24 hr tablet   Oral   Take 5 mg by mouth daily.           . probenecid (BENEMID) 500 MG tablet   Oral   Take 250 mg by mouth daily.          . ondansetron (ZOFRAN) 4 MG tablet   Oral   Take 1 tablet (4 mg total) by mouth every 6 (six) hours.   12 tablet   0     BP 111/68  Pulse 67  Temp(Src) 98.5 F (36.9 C) (Oral)  Resp 16  SpO2 97% Physical Exam  Nursing note and vitals reviewed. Constitutional: She appears well-developed and well-nourished. No distress.  Laying in bed. No acute distress.  HENT:  Head: Normocephalic and atraumatic.  Eyes: Conjunctivae are normal. Right eye exhibits no discharge. Left eye exhibits no discharge.  Neck: Neck supple.  Cardiovascular: Normal rate, regular rhythm and normal heart sounds.  Exam reveals no gallop and no friction rub.   No murmur heard. Pulmonary/Chest: Effort normal and breath sounds normal. No respiratory distress.  Abdominal: Soft. She exhibits no distension. There is no tenderness.  Left-sided ostomy with soft brown stool in back.  Musculoskeletal: She exhibits no edema and no tenderness.  Neurological: She is alert.  Skin: Skin is warm and dry.  Psychiatric: She has a normal mood and affect. Her behavior is normal. Thought content normal.    ED Course  Procedures (including critical care time) Labs Review Labs Reviewed  CBC WITH DIFFERENTIAL - Abnormal; Notable for the following:  Neutrophils Relative % 82 (*)    Lymphocytes Relative 11 (*)    All other components within normal limits  BASIC METABOLIC PANEL - Abnormal; Notable for the following:    Glucose, Bld 151 (*)    GFR calc non Af Amer 59 (*)    GFR calc Af Amer 69 (*)    All other components within normal limits   Imaging Review No results found.  Dg Abd 1 View  02/16/2013   CLINICAL DATA:  Abdominal pain and vomiting  EXAM: ABDOMEN - 1 VIEW  COMPARISON:  Prior radiograph from 04/11/2012  FINDINGS: The bowel gas pattern is normal. No evidence of obstruction or ileus. No free intraperitoneal air. No radio-opaque calculi or other significant radiographic abnormality are seen.  Fixation hardware overlying the lower lumbar spine is stable in position and appearance as compared to prior exam. Prominent degenerative changes  noted about both hips and within the lower lumbar spine, similar to prior. Visualized lung bases are clear.  IMPRESSION: No radiographic evidence of acute intra-abdominal process or bowel obstruction identified.   Electronically Signed   By: Jeannine Boga M.D.   On: 02/16/2013 23:26   EKG Interpretation   None       MDM   1. Nausea and vomiting    79 year old female with nausea and vomiting. She has benign abdominal exam. Workup has been reassuring. Patient actually has no symptoms prior to discharge. Her ostomy bag was changed. Return precautions discussed. Outpatient followup.    Virgel Manifold, MD 02/22/13 1455

## 2013-12-19 ENCOUNTER — Other Ambulatory Visit: Payer: Self-pay | Admitting: Internal Medicine

## 2013-12-19 DIAGNOSIS — Z1231 Encounter for screening mammogram for malignant neoplasm of breast: Secondary | ICD-10-CM

## 2013-12-19 DIAGNOSIS — Z1239 Encounter for other screening for malignant neoplasm of breast: Secondary | ICD-10-CM

## 2014-01-10 ENCOUNTER — Ambulatory Visit: Payer: Medicare Other

## 2014-01-31 ENCOUNTER — Ambulatory Visit: Payer: Medicare Other

## 2014-02-22 ENCOUNTER — Ambulatory Visit: Payer: Medicare Other

## 2014-07-11 ENCOUNTER — Ambulatory Visit (INDEPENDENT_AMBULATORY_CARE_PROVIDER_SITE_OTHER): Payer: Medicare Other | Admitting: Neurology

## 2014-07-11 ENCOUNTER — Ambulatory Visit (INDEPENDENT_AMBULATORY_CARE_PROVIDER_SITE_OTHER): Payer: Self-pay | Admitting: Neurology

## 2014-07-11 DIAGNOSIS — G5602 Carpal tunnel syndrome, left upper limb: Secondary | ICD-10-CM

## 2014-07-11 DIAGNOSIS — G5601 Carpal tunnel syndrome, right upper limb: Secondary | ICD-10-CM | POA: Diagnosis not present

## 2014-07-11 NOTE — Progress Notes (Signed)
  [id number]    Provider:  Dr Jaynee Eagles Referring Provider: Seward Carol, MD Primary Care Physician:  Kandice Hams, MD   HPI:  [name] is a 81 y.o. female here as a referral from Dr. Delfina Redwood for hand pain. She has pain in the left hand. The right hand is also affected but not as severe.   Summary:   Nerve Conduction Studies were performed on the bilateral upper extremities.  The right median APB motor nerve was within normal limits The right Median 2nd Digit sensory nerve was within normal limits F Wave studies indicate that the right Median F wave was within normal limits  The left median APB motor nerve showed prolonged distal onset latency (7.5 ms, N<4.0) and reduced amplitude (12mv, N>3) The left Median 2nd Digit sensory nerve showed prolonged distal peak latency (6.7 ms, N<3.9) and reduced amplitude (5uv, N>10) F Wave studies indicate that the left Median F wave has normal latency.   Bilateral Ulnar ADM motor nerves were within normal limits. F Wave studies indicate that the bilateral Ulnar F waves have normal latencies  The bilateralUlnar 5th digit sensory nerves were within normal limits. The bilateral Radial sensory nerves were within normal limits.  The right median/ulnar (palm) comparison nerve showed normal peak latency difference    EMG needle study of selected left upper extremity muscles was  performed. The left Opponens Pollicis muscle showed markedly increased spontaneous activity, reduced recruitment, prolonged duration and increased amplitude.  The following muscles were normal: Deltoid, Triceps,  Pronator Teres, First Dorsal Interosseous. Left C6/C7/C8 paraspinals.   Conclusion: This is an abnormal study. There is electrophysiologic evidence of left severe Carpal Tunnel Syndrome. No suggestion of polyneuropathy  or radiculopathy. Clinical correlation recommended.   Sarina Ill, MD  [facility] [address] [place], [place] [address]  Phone [phone] Fax [phone]

## 2014-07-11 NOTE — Procedures (Signed)
[id number]    Provider:  Dr Jaynee Eagles Referring Provider: Seward Carol, MD Primary Care Physician:  Kandice Hams, MD   HPI:  [name] is a 81 y.o. female here as a referral from Dr. Delfina Redwood for hand pain. She has pain in the left hand. The right hand is also affected but not as severe.   Summary:   Nerve Conduction Studies were performed on the bilateral upper extremities.  The right median APB motor nerve was within normal limits The right Median 2nd Digit sensory nerve was within normal limits F Wave studies indicate that the right Median F wave was within normal limits  The left median APB motor nerve showed prolonged distal onset latency (7.5 ms, N<4.0) and reduced amplitude (1mv, N>3) The left Median 2nd Digit sensory nerve showed prolonged distal peak latency (6.7 ms, N<3.9) and reduced amplitude (5uv, N>10) F Wave studies indicate that the left Median F wave has borderline latency (96ms, N<34).   Bilateral Ulnar ADM motor nerves were within normal limits. F Wave studies indicate that the bilateral Ulnar F waves have normal latencies  The bilateralUlnar 5th digit sensory nerves were within normal limits. The bilateral Radial sensory nerves were within normal limits.  The right median/ulnar (palm) comparison nerve showed normal peak latency difference    EMG needle study of selected left upper extremity muscles was  performed. The left Opponens Pollicis muscle showed markedly increased spontaneous activity, reduced recruitment, prolonged duration and increased amplitude.  The following muscles were normal: Deltoid, Triceps,  Pronator Teres, First Dorsal Interosseous. Left C6/C7/C8 paraspinals.   Conclusion: This is an abnormal study. There is electrophysiologic evidence of left severe Carpal Tunnel Syndrome. No suggestion of polyneuropathy  or radiculopathy. Clinical correlation recommended.   Sarina Ill, MD  [facility] [address] [place], [place] [address]  Phone [phone] Fax [phone]

## 2014-07-11 NOTE — Progress Notes (Signed)
See procedure note.

## 2014-10-30 ENCOUNTER — Emergency Department (HOSPITAL_COMMUNITY)
Admission: EM | Admit: 2014-10-30 | Discharge: 2014-10-30 | Disposition: A | Discharge status: Home / Self Care | Payer: Medicare Other | Attending: Emergency Medicine | Admitting: Emergency Medicine

## 2014-10-30 DIAGNOSIS — M79641 Pain in right hand: Secondary | ICD-10-CM | POA: Diagnosis not present

## 2014-10-30 DIAGNOSIS — Z79899 Other long term (current) drug therapy: Secondary | ICD-10-CM | POA: Diagnosis not present

## 2014-10-30 DIAGNOSIS — E119 Type 2 diabetes mellitus without complications: Secondary | ICD-10-CM | POA: Diagnosis not present

## 2014-10-30 DIAGNOSIS — J45909 Unspecified asthma, uncomplicated: Secondary | ICD-10-CM | POA: Diagnosis not present

## 2014-10-30 DIAGNOSIS — R5383 Other fatigue: Secondary | ICD-10-CM | POA: Diagnosis not present

## 2014-10-30 DIAGNOSIS — M79642 Pain in left hand: Secondary | ICD-10-CM | POA: Diagnosis present

## 2014-10-30 LAB — COMPREHENSIVE METABOLIC PANEL
ALBUMIN: 3.4 g/dL — AB (ref 3.5–5.0)
ALK PHOS: 89 U/L (ref 38–126)
ALT: 10 U/L — AB (ref 14–54)
ANION GAP: 7 (ref 5–15)
AST: 23 U/L (ref 15–41)
BILIRUBIN TOTAL: 0.3 mg/dL (ref 0.3–1.2)
BUN: 16 mg/dL (ref 6–20)
CALCIUM: 9.1 mg/dL (ref 8.9–10.3)
CO2: 25 mmol/L (ref 22–32)
Chloride: 107 mmol/L (ref 101–111)
Creatinine, Ser: 0.71 mg/dL (ref 0.44–1.00)
GLUCOSE: 97 mg/dL (ref 65–99)
Potassium: 3.9 mmol/L (ref 3.5–5.1)
SODIUM: 139 mmol/L (ref 135–145)
TOTAL PROTEIN: 7.1 g/dL (ref 6.5–8.1)

## 2014-10-30 LAB — CBC WITH DIFFERENTIAL/PLATELET
BASOS ABS: 0 10*3/uL (ref 0.0–0.1)
BASOS PCT: 0 % (ref 0–1)
Eosinophils Absolute: 0.2 10*3/uL (ref 0.0–0.7)
Eosinophils Relative: 5 % (ref 0–5)
HEMATOCRIT: 42 % (ref 36.0–46.0)
Hemoglobin: 13.6 g/dL (ref 12.0–15.0)
Lymphocytes Relative: 32 % (ref 12–46)
Lymphs Abs: 1.5 10*3/uL (ref 0.7–4.0)
MCH: 30 pg (ref 26.0–34.0)
MCHC: 32.4 g/dL (ref 30.0–36.0)
MCV: 92.7 fL (ref 78.0–100.0)
MONO ABS: 0.3 10*3/uL (ref 0.1–1.0)
Monocytes Relative: 6 % (ref 3–12)
NEUTROS ABS: 2.7 10*3/uL (ref 1.7–7.7)
NEUTROS PCT: 57 % (ref 43–77)
Platelets: 235 10*3/uL (ref 150–400)
RBC: 4.53 MIL/uL (ref 3.87–5.11)
RDW: 13.3 % (ref 11.5–15.5)
WBC: 4.8 10*3/uL (ref 4.0–10.5)

## 2014-10-30 LAB — I-STAT TROPONIN, ED: Troponin i, poc: 0 ng/mL (ref 0.00–0.08)

## 2014-10-30 NOTE — ED Notes (Signed)
Pt is taking all of her things home.

## 2014-10-30 NOTE — Discharge Instructions (Signed)

## 2014-10-30 NOTE — ED Provider Notes (Signed)
CSN: [id number]     Arrival date & time 10/30/14  1251 History   First MD Initiated Contact with Patient 10/30/14 1716     Chief Complaint  Patient presents with  . Hand Pain  . Fatigue     (Consider location/radiation/quality/duration/timing/severity/associated sxs/prior Treatment) HPI Comments: Patient obtained from patient and family.  Family reports patient with mild dementia, has been complaining of hand pain for several months, with more recent complaints of feeling weak and tired.  Patient today is reporting hand pain, but currently denies weakness or fatigue.  No chest pain, nausea, vomiting, abdominal pain, shortness of breath.  Patient is a 81 y.o. female presenting with hand pain. The history is provided by the patient and a significant other. No language interpreter was used.  Hand Pain This is a recurrent problem. The current episode started more than 1 month ago. The problem occurs constantly. The problem has been waxing and waning. Associated symptoms include arthralgias, fatigue and weakness. Pertinent negatives include no chest pain. Nothing aggravates the symptoms. She has tried nothing for the symptoms.    Past Medical History  Diagnosis Date  . Diabetes mellitus   . Asthma    No past surgical history on file. No family history on file. Social History  Substance Use Topics  . Smoking status: Never Smoker   . Smokeless tobacco: Not on file  . Alcohol Use: No   OB History    No data available     Review of Systems  Constitutional: Positive for fatigue.  Cardiovascular: Negative for chest pain.  Musculoskeletal: Positive for arthralgias.  Neurological: Positive for weakness.  All other systems reviewed and are negative.     Allergies  Review of patient's allergies indicates no known allergies.  Home Medications   Prior to Admission medications   Medication Sig Start Date End Date Taking? Authorizing Provider  alendronate (FOSAMAX) 70 MG tablet Take  70 mg by mouth every 7 (seven) days. Take with a full glass of water on an empty stomach.    Historical Provider, MD  amLODipine (NORVASC) 5 MG tablet Take 1 tablet (5 mg total) by mouth daily. 04/12/12   Seward Carol, MD  donepezil (ARICEPT) 5 MG tablet Take 5 mg by mouth at bedtime.    Historical Provider, MD  glipiZIDE (GLUCOTROL XL) 5 MG 24 hr tablet Take 5 mg by mouth daily.      Historical Provider, MD  ondansetron (ZOFRAN) 4 MG tablet Take 1 tablet (4 mg total) by mouth every 6 (six) hours. 02/16/13   Virgel Manifold, MD  probenecid (BENEMID) 500 MG tablet Take 250 mg by mouth daily.     Historical Provider, MD   BP 126/68 mmHg  Pulse 67  Temp(Src) 98.5 F (36.9 C) (Oral)  Resp 18  SpO2 97% Physical Exam  Constitutional: She is oriented to person, place, and time. She appears well-developed and well-nourished.  HENT:  Head: Normocephalic.  Eyes: Conjunctivae are normal.  Neck: Neck supple.  Cardiovascular: Normal rate, regular rhythm and intact distal pulses.   Pulmonary/Chest: Effort normal and breath sounds normal.  Abdominal: Soft. There is no tenderness.  Musculoskeletal: She exhibits tenderness.  Equal grip strength on my exam.  Mild swelling noted to MP joints of both hands.  Not hot to touch.  Neurological: She is alert and oriented to person, place, and time.  Skin: Skin is warm and dry.  Psychiatric: She has a normal mood and affect.  Nursing note and vitals reviewed.  ED Course  Procedures (including critical care time) Labs Review Labs Reviewed  COMPREHENSIVE METABOLIC PANEL - Abnormal; Notable for the following:    Albumin 3.4 (*)    ALT 10 (*)    All other components within normal limits  CBC WITH DIFFERENTIAL/PLATELET  I-STAT TROPOININ, ED    Imaging Review No results found. I have personally reviewed and evaluated these images and lab results as part of my medical decision-making.   EKG Interpretation   Date/Time:  Tuesday October 30 2014 18:06:52  EDT Ventricular Rate:  56 PR Interval:  198 QRS Duration: 92 QT Interval:  448 QTC Calculation: 432 R Axis:   -39 Text Interpretation:  Sinus bradycardia Left axis deviation Nonspecific ST  abnormality Abnormal ECG No significant change since last tracing  Confirmed by Mingo Amber  MD, [place] ([id number]) on 10/30/2014 6:07:46 PM     Lab results reviewed.  No indication of anemia. Normal troponin.  Sinus bradycardia on ECG, unchanged since last tracing.  Suspect arthritis of hands.  Patient discussed with and seen by Dr. Mingo Amber.  Follow-up with PCP. Return precautions discussed. MDM   Final diagnoses:  None    Arthralgia.    Etta Quill, NP 10/30/14 [place], MD 10/30/14 [phone]

## 2014-10-30 NOTE — ED Notes (Signed)
Pt reports bilateral hand pain and numbness for months. Says grip strength is less. Gets weak all over. States it seems like doing small tasks takes a lot out of her.

## 2015-01-09 ENCOUNTER — Emergency Department (HOSPITAL_COMMUNITY)
Admission: EM | Admit: 2015-01-09 | Discharge: 2015-01-09 | Disposition: A | Discharge status: Home / Self Care | Payer: Medicare Other | Attending: Emergency Medicine | Admitting: Emergency Medicine

## 2015-01-09 ENCOUNTER — Encounter (HOSPITAL_COMMUNITY): Payer: Self-pay | Admitting: Emergency Medicine

## 2015-01-09 ENCOUNTER — Emergency Department (HOSPITAL_COMMUNITY): Payer: Medicare Other

## 2015-01-09 DIAGNOSIS — Z79899 Other long term (current) drug therapy: Secondary | ICD-10-CM | POA: Insufficient documentation

## 2015-01-09 DIAGNOSIS — R531 Weakness: Secondary | ICD-10-CM | POA: Diagnosis not present

## 2015-01-09 DIAGNOSIS — E119 Type 2 diabetes mellitus without complications: Secondary | ICD-10-CM | POA: Insufficient documentation

## 2015-01-09 DIAGNOSIS — J45909 Unspecified asthma, uncomplicated: Secondary | ICD-10-CM | POA: Insufficient documentation

## 2015-01-09 DIAGNOSIS — R42 Dizziness and giddiness: Secondary | ICD-10-CM | POA: Insufficient documentation

## 2015-01-09 LAB — URINALYSIS, ROUTINE W REFLEX MICROSCOPIC
Bilirubin Urine: NEGATIVE
GLUCOSE, UA: NEGATIVE mg/dL
HGB URINE DIPSTICK: NEGATIVE
KETONES UR: NEGATIVE mg/dL
Nitrite: NEGATIVE
PH: 8.5 — AB (ref 5.0–8.0)
PROTEIN: NEGATIVE mg/dL
Specific Gravity, Urine: 1.009 (ref 1.005–1.030)
Urobilinogen, UA: 1 mg/dL (ref 0.0–1.0)

## 2015-01-09 LAB — CBC WITH DIFFERENTIAL/PLATELET
Basophils Absolute: 0 10*3/uL (ref 0.0–0.1)
Basophils Relative: 1 %
EOS ABS: 0.2 10*3/uL (ref 0.0–0.7)
EOS PCT: 4 %
HCT: 41.1 % (ref 36.0–46.0)
Hemoglobin: 13.4 g/dL (ref 12.0–15.0)
LYMPHS ABS: 1.7 10*3/uL (ref 0.7–4.0)
LYMPHS PCT: 34 %
MCH: 30.1 pg (ref 26.0–34.0)
MCHC: 32.6 g/dL (ref 30.0–36.0)
MCV: 92.4 fL (ref 78.0–100.0)
MONOS PCT: 8 %
Monocytes Absolute: 0.4 10*3/uL (ref 0.1–1.0)
Neutro Abs: 2.6 10*3/uL (ref 1.7–7.7)
Neutrophils Relative %: 53 %
PLATELETS: 198 10*3/uL (ref 150–400)
RBC: 4.45 MIL/uL (ref 3.87–5.11)
RDW: 13.6 % (ref 11.5–15.5)
WBC: 5 10*3/uL (ref 4.0–10.5)

## 2015-01-09 LAB — COMPREHENSIVE METABOLIC PANEL
ALT: 9 U/L — AB (ref 14–54)
AST: 18 U/L (ref 15–41)
Albumin: 3.3 g/dL — ABNORMAL LOW (ref 3.5–5.0)
Alkaline Phosphatase: 76 U/L (ref 38–126)
Anion gap: 12 (ref 5–15)
BILIRUBIN TOTAL: 0.7 mg/dL (ref 0.3–1.2)
BUN: 13 mg/dL (ref 6–20)
CHLORIDE: 107 mmol/L (ref 101–111)
CO2: 23 mmol/L (ref 22–32)
CREATININE: 0.89 mg/dL (ref 0.44–1.00)
Calcium: 9.6 mg/dL (ref 8.9–10.3)
Glucose, Bld: 76 mg/dL (ref 65–99)
Potassium: 4 mmol/L (ref 3.5–5.1)
Sodium: 142 mmol/L (ref 135–145)
TOTAL PROTEIN: 6.8 g/dL (ref 6.5–8.1)

## 2015-01-09 LAB — URINE MICROSCOPIC-ADD ON

## 2015-01-09 LAB — TROPONIN I

## 2015-01-09 MED ORDER — SODIUM CHLORIDE 0.9 % IV BOLUS (SEPSIS)
1000.0000 mL | Freq: Once | INTRAVENOUS | Status: AC
  Administered 2015-01-09: 1000 mL via INTRAVENOUS

## 2015-01-09 NOTE — Discharge Instructions (Signed)
It was our pleasure to provide your ER care today - we hope that you feel better.  Rest. Drink plenty of fluids.   Follow up with primary care doctor in the next couple days for recheck.  Your mri scan was read by our radiologist as showing no stroke.  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  Return to ER if worse, new symptoms, fevers, fainting, chest pain, trouble breathing, other concern.      Dizziness Dizziness is a common problem. It is a feeling of unsteadiness or light-headedness. You may feel like you are about to faint. Dizziness can lead to injury if you stumble or fall. Anyone can become dizzy, but dizziness is more common in older adults. This condition can be caused by a number of things, including medicines, dehydration, or illness. HOME CARE INSTRUCTIONS Taking these steps may help with your condition: Eating and Drinking  Drink enough fluid to keep your urine clear or pale yellow. This helps to keep you from becoming dehydrated. Try to drink more clear fluids, such as water.  Do not drink alcohol.  Limit your caffeine intake if directed by your health care provider.  Limit your salt intake if directed by your health care provider. Activity  Avoid making quick movements.  Rise slowly from chairs and steady yourself until you feel okay.  In the morning, first sit up on the side of the bed. When you feel okay, stand slowly while you hold onto something until you know that your balance is fine.  Move your legs often if you need to stand in one place for a long time. Tighten and relax your muscles in your legs while you are standing.  Do not drive or operate heavy machinery if you feel dizzy.  Avoid bending down if you feel dizzy. Place items in your home so that they are easy for you to reach without leaning over. Lifestyle  Do not use any tobacco  products, including cigarettes, chewing tobacco, or electronic cigarettes. If you need help quitting, ask your health care provider.  Try to reduce your stress level, such as with yoga or meditation. Talk with your health care provider if you need help. General Instructions  Watch your dizziness for any changes.  Take medicines only as directed by your health care provider. Talk with your health care provider if you think that your dizziness is caused by a medicine that you are taking.  Tell a friend or a family member that you are feeling dizzy. If he or she notices any changes in your behavior, have this person call your health care provider.  Keep all follow-up visits as directed by your health care provider. This is important. SEEK MEDICAL CARE IF:  Your dizziness does not go away.  Your dizziness or light-headedness gets worse.  You feel nauseous.  You have reduced hearing.  You have new symptoms.  You are unsteady on your feet or you feel like the room is spinning. SEEK IMMEDIATE MEDICAL CARE IF:  You vomit or have diarrhea and are unable to eat or drink anything.  You have problems talking, walking, swallowing, or using your arms, hands, or legs.  You feel generally weak.  You are not thinking clearly or you have trouble forming sentences. It may take a friend or family member to notice this.  You have chest pain, abdominal pain, shortness of  breath, or sweating.  Your vision changes.  You notice any bleeding.  You have a headache.  You have neck pain or a stiff neck.  You have a fever.   This information is not intended to replace advice given to you by your health care provider. Make sure you discuss any questions you have with your health care provider.   Document Released: 08/26/2000 Document Revised: 07/17/2014 Document Reviewed: 02/26/2014 Elsevier Interactive Patient Education [employer].

## 2015-01-09 NOTE — ED Provider Notes (Signed)
CSN: [id number]     Arrival date & time 01/09/15  1116 History   First MD Initiated Contact with Patient 01/09/15 1151     Chief Complaint  Patient presents with  . Dizziness     (Consider location/radiation/quality/duration/timing/severity/associated sxs/prior Treatment) Patient is a 81 y.o. female presenting with dizziness.  Dizziness Quality:  Lightheadedness Severity:  Severe Onset quality:  Gradual Duration:  1 month Timing:  Constant Progression:  Worsening Chronicity:  New Context: standing up   Relieved by:  Lying down Worsened by:  Standing up Ineffective treatments:  None tried Associated symptoms: weakness (generalized.  no focal weakness.)   Associated symptoms: no blood in stool, no chest pain, no diarrhea, no nausea, no shortness of breath, no syncope and no vomiting     Past Medical History  Diagnosis Date  . Diabetes mellitus   . Asthma    History reviewed. No pertinent past surgical history. No family history on file. Social History  Substance Use Topics  . Smoking status: Never Smoker   . Smokeless tobacco: None  . Alcohol Use: No   OB History    No data available     Review of Systems  Respiratory: Negative for shortness of breath.   Cardiovascular: Negative for chest pain and syncope.  Gastrointestinal: Negative for nausea, vomiting, diarrhea and blood in stool.  Neurological: Positive for dizziness and weakness (generalized.  no focal weakness.).  All other systems reviewed and are negative.     Allergies  Review of patient's allergies indicates no known allergies.  Home Medications   Prior to Admission medications   Medication Sig Start Date End Date Taking? Authorizing Provider  alendronate (FOSAMAX) 70 MG tablet Take 70 mg by mouth every 7 (seven) days. Take with a full glass of water on an empty stomach.   Yes Historical Provider, MD  amLODipine (NORVASC) 5 MG tablet Take 1 tablet (5 mg total) by mouth daily. 04/12/12  Yes Seward Carol, MD  donepezil (ARICEPT) 5 MG tablet Take 5 mg by mouth at bedtime.   Yes Historical Provider, MD  glipiZIDE (GLUCOTROL XL) 5 MG 24 hr tablet Take 5 mg by mouth daily.     Yes Historical Provider, MD  probenecid (BENEMID) 500 MG tablet Take 250 mg by mouth daily.    Yes Historical Provider, MD  VITAMIN A PO Take 1 tablet by mouth daily.   Yes Historical Provider, MD   BP 140/75 mmHg  Pulse 56  Temp(Src) 98.2 F (36.8 C) (Oral)  Resp 16  SpO2 98% Physical Exam  Constitutional: She is oriented to person, place, and time. She appears well-developed and well-nourished. No distress.  HENT:  Head: Normocephalic and atraumatic.  Mouth/Throat: Oropharynx is clear and moist.  Eyes: Conjunctivae are normal. Pupils are equal, round, and reactive to light. No scleral icterus.  Neck: Neck supple.  Cardiovascular: Normal rate, regular rhythm, normal heart sounds and intact distal pulses.   No murmur heard. Pulmonary/Chest: Effort normal and breath sounds normal. No stridor. No respiratory distress. She has no rales.  Abdominal: Soft. Bowel sounds are normal. She exhibits no distension. There is no tenderness.  Musculoskeletal: Normal range of motion. She exhibits no edema.  Neurological: She is alert and oriented to person, place, and time.  Skin: Skin is warm and dry. No rash noted.  Psychiatric: She has a normal mood and affect. Her behavior is normal.  Nursing note and vitals reviewed.   ED Course  Procedures (including critical care time)  Labs Review Labs Reviewed  URINALYSIS, ROUTINE W REFLEX MICROSCOPIC (NOT AT [facility]) - Abnormal; Notable for the following:    APPearance CLOUDY (*)    pH 8.5 (*)    Leukocytes, UA SMALL (*)    All other components within normal limits  COMPREHENSIVE METABOLIC PANEL - Abnormal; Notable for the following:    Albumin 3.3 (*)    ALT 9 (*)    All other components within normal limits  URINE MICROSCOPIC-ADD ON - Abnormal; Notable for the following:     Squamous Epithelial / LPF MANY (*)    All other components within normal limits  CBC WITH DIFFERENTIAL/PLATELET  TROPONIN I    Imaging Review No results found. I have personally reviewed and evaluated these images and lab results as part of my medical decision-making.   EKG Interpretation   Date/Time:  Wednesday January 09 2015 11:23:52 EDT Ventricular Rate:  56 PR Interval:  193 QRS Duration: 102 QT Interval:  418 QTC Calculation: 403 R Axis:   -43 Text Interpretation:  Sinus rhythm Left axis deviation compared to prior,  nonspecific ST abnormality improved. Confirmed by [facility]  MD, TREY [id number])  on 01/09/2015 4:49:04 PM      MDM   Final diagnoses:  Dizziness    81 yo female with complaint of dizziness up standing.  Described as the sensation of feeling like she's going to pass out. She has not actually passed out. She does not have any other significant associated symptoms.  Symptoms have been present to some degree for a month, but have worsened over the last week.  Well appearing on exam.    Labs unremarkable.  UA pending.  She felt lightheaded when she stood up, so will give fluids and recheck.    Care transferred to oncoming provider at 17:00.  Serita Grit, MD 01/09/15 2022

## 2015-01-09 NOTE — ED Provider Notes (Signed)
Patient signed out by Dr Doy Mince, that presented w vertiginous symptoms, and that if ambulatory/improved w meds, to d/c to home.  On recheck, dizziness improved, however pt w difficulty ambulating, is able to, but w unsteady gait. Pt denies headache. No neck or back pain. No change in vision or speech. No unilateral numbness or weakness. Denies ear pain or tinnitus. No hearing loss. Denies hx vertigo.  Dizziness not clearly worse w change in head position or movements.  Denies chest pain, sob or unusual doe. No fever or chills. Given persistent unsteady gait, will get MR r/o posterior cva.  MRI neg for stroke.  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  Discussed mri result with neurology on call, Dr Aram Beecham, who indicates likely of no acute clinical significance, and can f/u as outpt.   Recheck, pt tolerating po fluids, afeb, feels improved.   Pt currently appears stable for d/c.  Rec close outpt f/u w pcp, and neurology.       Lajean Saver, MD 01/09/15 2039

## 2015-01-09 NOTE — ED Notes (Signed)
Pt returned from MRI °

## 2015-01-09 NOTE — ED Notes (Signed)
From home via [facility], c/o dizziness and weakness X2 days, CBG 108, VSS, is at neuro baseline

## 2015-01-09 NOTE — ED Notes (Signed)
Pt's granddaughter [name] [phone]

## 2015-01-09 NOTE — ED Notes (Signed)
Unable to ambulate pt due to unsteady gait. Pt attempted to walk to door but was unable to walk stated having a "funny feeling". Pt given a sprite to drink.

## 2015-01-09 NOTE — ED Notes (Signed)
Attempted IV x2 unsuccessful  Phlebotomy notified.

## 2015-03-10 ENCOUNTER — Encounter (HOSPITAL_COMMUNITY): Payer: Self-pay | Admitting: Emergency Medicine

## 2015-03-10 ENCOUNTER — Emergency Department (HOSPITAL_COMMUNITY)
Admission: EM | Admit: 2015-03-10 | Discharge: 2015-03-10 | Disposition: A | Discharge status: Home / Self Care | Payer: Medicare Other | Attending: Emergency Medicine | Admitting: Emergency Medicine

## 2015-03-10 DIAGNOSIS — R42 Dizziness and giddiness: Secondary | ICD-10-CM | POA: Insufficient documentation

## 2015-03-10 DIAGNOSIS — E119 Type 2 diabetes mellitus without complications: Secondary | ICD-10-CM | POA: Insufficient documentation

## 2015-03-10 DIAGNOSIS — R531 Weakness: Secondary | ICD-10-CM | POA: Diagnosis not present

## 2015-03-10 DIAGNOSIS — Z79899 Other long term (current) drug therapy: Secondary | ICD-10-CM | POA: Diagnosis not present

## 2015-03-10 DIAGNOSIS — R630 Anorexia: Secondary | ICD-10-CM | POA: Insufficient documentation

## 2015-03-10 DIAGNOSIS — Z7984 Long term (current) use of oral hypoglycemic drugs: Secondary | ICD-10-CM | POA: Diagnosis not present

## 2015-03-10 DIAGNOSIS — J45909 Unspecified asthma, uncomplicated: Secondary | ICD-10-CM | POA: Diagnosis not present

## 2015-03-10 DIAGNOSIS — F0390 Unspecified dementia without behavioral disturbance: Secondary | ICD-10-CM | POA: Insufficient documentation

## 2015-03-10 DIAGNOSIS — R1110 Vomiting, unspecified: Secondary | ICD-10-CM | POA: Insufficient documentation

## 2015-03-10 LAB — TROPONIN I

## 2015-03-10 LAB — BASIC METABOLIC PANEL
ANION GAP: 10 (ref 5–15)
BUN: 12 mg/dL (ref 6–20)
CO2: 28 mmol/L (ref 22–32)
Calcium: 9.6 mg/dL (ref 8.9–10.3)
Chloride: 104 mmol/L (ref 101–111)
Creatinine, Ser: 0.86 mg/dL (ref 0.44–1.00)
Glucose, Bld: 189 mg/dL — ABNORMAL HIGH (ref 65–99)
POTASSIUM: 3.8 mmol/L (ref 3.5–5.1)
SODIUM: 142 mmol/L (ref 135–145)

## 2015-03-10 LAB — URINALYSIS, ROUTINE W REFLEX MICROSCOPIC
Glucose, UA: NEGATIVE mg/dL
KETONES UR: 15 mg/dL — AB
LEUKOCYTES UA: NEGATIVE
NITRITE: NEGATIVE
PH: 5 (ref 5.0–8.0)
PROTEIN: 30 mg/dL — AB
Specific Gravity, Urine: 1.031 — ABNORMAL HIGH (ref 1.005–1.030)

## 2015-03-10 LAB — URINE MICROSCOPIC-ADD ON

## 2015-03-10 LAB — CBC
HEMATOCRIT: 43.6 % (ref 36.0–46.0)
HEMOGLOBIN: 14.3 g/dL (ref 12.0–15.0)
MCH: 30.6 pg (ref 26.0–34.0)
MCHC: 32.8 g/dL (ref 30.0–36.0)
MCV: 93.4 fL (ref 78.0–100.0)
Platelets: 202 10*3/uL (ref 150–400)
RBC: 4.67 MIL/uL (ref 3.87–5.11)
RDW: 13.7 % (ref 11.5–15.5)
WBC: 7.1 10*3/uL (ref 4.0–10.5)

## 2015-03-10 LAB — POC OCCULT BLOOD, ED: Fecal Occult Bld: NEGATIVE

## 2015-03-10 MED ORDER — SODIUM CHLORIDE 0.9 % IV BOLUS (SEPSIS)
1000.0000 mL | Freq: Once | INTRAVENOUS | Status: AC
  Administered 2015-03-10: 1000 mL via INTRAVENOUS

## 2015-03-10 MED ORDER — THIAMINE HCL 100 MG/ML IJ SOLN
100.0000 mg | Freq: Once | INTRAMUSCULAR | Status: AC
  Administered 2015-03-10: 100 mg via INTRAVENOUS
  Filled 2015-03-10: qty 2

## 2015-03-10 NOTE — ED Notes (Signed)
Pt attempting to void at this time.

## 2015-03-10 NOTE — ED Notes (Signed)
Pt cbg 156

## 2015-03-10 NOTE — ED Notes (Signed)
EMS - Patient coming from home with c/o of weakness and "feeling dizzy" since yesterday.  Patient passed the stroke screen and orthostatics with EMS.  States "energy is wiped out".  Vital signs 179/94, 186 CBG, 61 HR and 16 respirations.  Hx of dementia, asthma and diabetes.

## 2015-03-10 NOTE — ED Provider Notes (Addendum)
CSN: [id number]     Arrival date & time 03/10/15  [id number] History   First MD Initiated Contact with Patient 03/10/15 1117     Chief Complaint  Patient presents with  . Weakness  . Dizziness     (Consider location/radiation/quality/duration/timing/severity/associated sxs/prior Treatment) HPI Level V caveat dementia history is obtained from patient and from patient's adult granddaughter via telephone. Patient complains of generalized weakness for approximately 1 week worse with standing and improved with lying down. No fever she denies pain anywhere her granddaughter reported she vomited one time last night. She was brought by EMS. No treatment prior to coming here. Presently she denies nausea denies any symptoms except for generalized. She's had diminished appetite for the past several days. Past Medical History  Diagnosis Date  . Diabetes mellitus   . Asthma    dementia History reviewed. No pertinent past surgical history. No family history on file. Social History  Substance Use Topics  . Smoking status: Never Smoker   . Smokeless tobacco: None  . Alcohol Use: No   OB History    No data available     Review of Systems  Unable to perform ROS: Dementia  Constitutional: Positive for appetite change.  Gastrointestinal: Positive for vomiting.  Allergic/Immunologic: Positive for immunocompromised state.       Diabetic  Neurological: Positive for weakness.      Allergies  Review of patient's allergies indicates no known allergies.  Home Medications   Prior to Admission medications   Medication Sig Start Date End Date Taking? Authorizing Provider  alendronate (FOSAMAX) 70 MG tablet Take 70 mg by mouth every 7 (seven) days. Take with a full glass of water on an empty stomach.    Historical Provider, MD  amLODipine (NORVASC) 5 MG tablet Take 1 tablet (5 mg total) by mouth daily. 04/12/12   Seward Carol, MD  donepezil (ARICEPT) 5 MG tablet Take 5 mg by mouth at bedtime.     Historical Provider, MD  glipiZIDE (GLUCOTROL XL) 5 MG 24 hr tablet Take 5 mg by mouth daily.      Historical Provider, MD  probenecid (BENEMID) 500 MG tablet Take 250 mg by mouth daily.     Historical Provider, MD  VITAMIN A PO Take 1 tablet by mouth daily.    Historical Provider, MD   BP 152/80 mmHg  Pulse 57  Temp(Src) 98.5 F (36.9 C) (Oral)  Resp 16  SpO2 99% Physical Exam  Constitutional: She appears well-developed and well-nourished.  HENT:  Head: Normocephalic and atraumatic.  Mucous membranes dry  Eyes: Conjunctivae are normal. Pupils are equal, round, and reactive to light.  Neck: Neck supple. No tracheal deviation present. No thyromegaly present.  Cardiovascular: Normal rate and regular rhythm.   No murmur heard. Pulmonary/Chest: Effort normal and breath sounds normal.  Abdominal: Soft. Bowel sounds are normal. She exhibits no distension. There is no tenderness.  Genitourinary:  Rectal normal tone and brown stool Hemoccult negative  Musculoskeletal: Normal range of motion. She exhibits no edema or tenderness.  Neurological: She is alert. Coordination normal.  Oriented name month and hospital does not know year container nerves II through XII intact gait normal become slightly lightheaded upon standing  Skin: Skin is warm and dry. No rash noted.  Psychiatric: She has a normal mood and affect.  Nursing note and vitals reviewed.   ED Course  Procedures (including critical care time) Labs Review Labs Reviewed  BASIC METABOLIC PANEL - Abnormal; Notable for the following:  Glucose, Bld 189 (*)    All other components within normal limits  CBC  URINALYSIS, ROUTINE W REFLEX MICROSCOPIC (NOT AT [facility])  CBG MONITORING, ED    Imaging Review No results found. I have personally reviewed and evaluated these images and lab results as part of my medical decision-making.   EKG Interpretation   Date/Time:  Sunday March 10 2015 09:22:06 EST Ventricular Rate:  61 PR  Interval:  192 QRS Duration: 95 QT Interval:  432 QTC Calculation: 435 R Axis:   -29 Text Interpretation:  Sinus rhythm Probable left atrial enlargement  Borderline left axis deviation Baseline wander in lead(s) V6 No  significant change since last tracing Confirmed by Dixie Jafri  MD, Alyzabeth Pontillo  [phone]) on 03/10/2015 2:31:11 PM     3:20 PM patient to see resting comfortably. She continues to feel weak.denies pain anywhere. Denies nausea Results for orders placed or performed during the hospital encounter of 0000000  Basic metabolic panel  Result Value Ref Range   Sodium 142 135 - 145 mmol/L   Potassium 3.8 3.5 - 5.1 mmol/L   Chloride 104 101 - 111 mmol/L   CO2 28 22 - 32 mmol/L   Glucose, Bld 189 (H) 65 - 99 mg/dL   BUN 12 6 - 20 mg/dL   Creatinine, Ser 0.86 0.44 - 1.00 mg/dL   Calcium 9.6 8.9 - 10.3 mg/dL   GFR calc non Af Amer >60 >60 mL/min   GFR calc Af Amer >60 >60 mL/min   Anion gap 10 5 - 15  CBC  Result Value Ref Range   WBC 7.1 4.0 - 10.5 K/uL   RBC 4.67 3.87 - 5.11 MIL/uL   Hemoglobin 14.3 12.0 - 15.0 g/dL   HCT 43.6 36.0 - 46.0 %   MCV 93.4 78.0 - 100.0 fL   MCH 30.6 26.0 - 34.0 pg   MCHC 32.8 30.0 - 36.0 g/dL   RDW 13.7 11.5 - 15.5 %   Platelets 202 150 - 400 K/uL  Urinalysis, Routine w reflex microscopic (not at [facility])  Result Value Ref Range   Color, Urine AMBER (A) YELLOW   APPearance CLEAR CLEAR   Specific Gravity, Urine 1.031 (H) 1.005 - 1.030   pH 5.0 5.0 - 8.0   Glucose, UA NEGATIVE NEGATIVE mg/dL   Hgb urine dipstick SMALL (A) NEGATIVE   Bilirubin Urine SMALL (A) NEGATIVE   Ketones, ur 15 (A) NEGATIVE mg/dL   Protein, ur 30 (A) NEGATIVE mg/dL   Nitrite NEGATIVE NEGATIVE   Leukocytes, UA NEGATIVE NEGATIVE  Troponin I  Result Value Ref Range   Troponin I <0.03 <0.031 ng/mL  Urine microscopic-add on  Result Value Ref Range   Squamous Epithelial / LPF 0-5 (A) NONE SEEN   WBC, UA 0-5 0 - 5 WBC/hpf   RBC / HPF 0-5 0 - 5 RBC/hpf   Bacteria, UA RARE (A)  NONE SEEN  POC occult blood, ED Provider will collect  Result Value Ref Range   Fecal Occult Bld NEGATIVE NEGATIVE   No results found.  MDM  She is clinically mildly dehydrated with diminished oral intake. I encourage oral hydration and f/u with pmd. Follow-up with primary care physician Final diagnoses:  None  no signs of infection or ACS or end organ damage  Dx #1 weakness #2 hyperglycemia #3elevated blood pressure   Orlie Dakin, MD 03/10/15 [place], MD 03/10/15 1535

## 2015-03-10 NOTE — ED Notes (Signed)
MD at bedside. 

## 2015-03-10 NOTE — ED Notes (Signed)
Uncle at bedside.

## 2015-03-10 NOTE — Discharge Instructions (Signed)
Weakness [name] appears mildly dehydrated. She should be encouraged to drink at least six 8 ounce glasses of water each day. Her blood sugar is mildly elevated today 189. Her blood pressure was elevated at 178/86. Call her primary care physician tomorrow to get her checked at the next available appointment. Her blood pressure should be rechecked within a week. Return if concerned for any reason. Weakness is a lack of strength. It may be felt all over the body (generalized) or in one specific part of the body (focal). Some causes of weakness can be serious. You may need further medical evaluation, especially if you are elderly or you have a history of immunosuppression (such as chemotherapy or HIV), kidney disease, heart disease, or diabetes. CAUSES  Weakness can be caused by many different things, including:  Infection.  Physical exhaustion.  Internal bleeding or other blood loss that results in a lack of red blood cells (anemia).  Dehydration. This cause is more common in elderly people.  Side effects or electrolyte abnormalities from medicines, such as pain medicines or sedatives.  Emotional distress, anxiety, or depression.  Circulation problems, especially severe peripheral arterial disease.  Heart disease, such as rapid atrial fibrillation, bradycardia, or heart failure.  Nervous system disorders, such as Guillain-Barr syndrome, multiple sclerosis, or stroke. DIAGNOSIS  To find the cause of your weakness, your caregiver will take your history and perform a physical exam. Lab tests or X-rays may also be ordered, if needed. TREATMENT  Treatment of weakness depends on the cause of your symptoms and can vary greatly. HOME CARE INSTRUCTIONS   Rest as needed.  Eat a well-balanced diet.  Try to get some exercise every day.  Only take over-the-counter or prescription medicines as directed by your caregiver. SEEK MEDICAL CARE IF:   Your weakness seems to be getting worse or  spreads to other parts of your body.  You develop new aches or pains. SEEK IMMEDIATE MEDICAL CARE IF:   You cannot perform your normal daily activities, such as getting dressed and feeding yourself.  You cannot walk up and down stairs, or you feel exhausted when you do so.  You have shortness of breath or chest pain.  You have difficulty moving parts of your body.  You have weakness in only one area of the body or on only one side of the body.  You have a fever.  You have trouble speaking or swallowing.  You cannot control your bladder or bowel movements.  You have black or bloody vomit or stools. MAKE SURE YOU:  Understand these instructions.  Will watch your condition.  Will get help right away if you are not doing well or get worse.   This information is not intended to replace advice given to you by your health care provider. Make sure you discuss any questions you have with your health care provider.   Document Released: 03/02/2005 Document Revised: 09/01/2011 Document Reviewed: 05/01/2011 Elsevier Interactive Patient Education [employer].

## 2015-03-12 LAB — CBG MONITORING, ED: GLUCOSE-CAPILLARY: 156 mg/dL — AB (ref 65–99)

## 2015-03-20 ENCOUNTER — Ambulatory Visit: Payer: Medicare Other | Attending: Internal Medicine | Admitting: Physical Therapy

## 2015-07-03 ENCOUNTER — Ambulatory Visit: Payer: Medicare Other | Admitting: Podiatry

## 2015-10-25 ENCOUNTER — Emergency Department (HOSPITAL_COMMUNITY)
Admission: EM | Admit: 2015-10-25 | Discharge: 2015-10-25 | Disposition: A | Discharge status: Home / Self Care | Payer: Medicare Other | Attending: Emergency Medicine | Admitting: Emergency Medicine

## 2015-10-25 ENCOUNTER — Encounter (HOSPITAL_COMMUNITY): Payer: Self-pay | Admitting: Emergency Medicine

## 2015-10-25 DIAGNOSIS — Z7984 Long term (current) use of oral hypoglycemic drugs: Secondary | ICD-10-CM | POA: Insufficient documentation

## 2015-10-25 DIAGNOSIS — E119 Type 2 diabetes mellitus without complications: Secondary | ICD-10-CM | POA: Diagnosis not present

## 2015-10-25 DIAGNOSIS — Z79899 Other long term (current) drug therapy: Secondary | ICD-10-CM | POA: Insufficient documentation

## 2015-10-25 DIAGNOSIS — R531 Weakness: Secondary | ICD-10-CM

## 2015-10-25 DIAGNOSIS — J45909 Unspecified asthma, uncomplicated: Secondary | ICD-10-CM | POA: Diagnosis not present

## 2015-10-25 LAB — COMPREHENSIVE METABOLIC PANEL
ALBUMIN: 3.5 g/dL (ref 3.5–5.0)
ALK PHOS: 76 U/L (ref 38–126)
ALT: 14 U/L (ref 14–54)
AST: 20 U/L (ref 15–41)
Anion gap: 7 (ref 5–15)
BILIRUBIN TOTAL: 0.3 mg/dL (ref 0.3–1.2)
BUN: 25 mg/dL — AB (ref 6–20)
CALCIUM: 9.4 mg/dL (ref 8.9–10.3)
CO2: 25 mmol/L (ref 22–32)
CREATININE: 0.87 mg/dL (ref 0.44–1.00)
Chloride: 110 mmol/L (ref 101–111)
GFR calc Af Amer: >60 mL/min (ref >60)
GFR calc non Af Amer: >60 mL/min (ref >60)
GLUCOSE: 107 mg/dL — AB (ref 65–99)
Potassium: 3.9 mmol/L (ref 3.5–5.1)
Sodium: 142 mmol/L (ref 135–145)
TOTAL PROTEIN: 6.8 g/dL (ref 6.5–8.1)

## 2015-10-25 LAB — RAPID URINE DRUG SCREEN, HOSP PERFORMED
Amphetamines: NOT DETECTED
Barbiturates: NOT DETECTED
Benzodiazepines: NOT DETECTED
Cocaine: NOT DETECTED
Opiates: NOT DETECTED
Tetrahydrocannabinol: NOT DETECTED

## 2015-10-25 LAB — CBC
HCT: 40.8 % (ref 36.0–46.0)
HEMOGLOBIN: 13 g/dL (ref 12.0–15.0)
MCH: 30.2 pg (ref 26.0–34.0)
MCHC: 31.9 g/dL (ref 30.0–36.0)
MCV: 94.7 fL (ref 78.0–100.0)
Platelets: 197 10*3/uL (ref 150–400)
RBC: 4.31 MIL/uL (ref 3.87–5.11)
RDW: 14 % (ref 11.5–15.5)
WBC: 5.4 10*3/uL (ref 4.0–10.5)

## 2015-10-25 LAB — DIFFERENTIAL
BASOS ABS: 0 10*3/uL (ref 0.0–0.1)
Basophils Relative: 1 %
EOS PCT: 8 %
Eosinophils Absolute: 0.4 10*3/uL (ref 0.0–0.7)
LYMPHS ABS: 2.4 10*3/uL (ref 0.7–4.0)
LYMPHS PCT: 43 %
Monocytes Absolute: 0.4 10*3/uL (ref 0.1–1.0)
Monocytes Relative: 7 %
NEUTROS PCT: 40 %
Neutro Abs: 2.2 10*3/uL (ref 1.7–7.7)

## 2015-10-25 LAB — URINALYSIS, ROUTINE W REFLEX MICROSCOPIC
Bilirubin Urine: NEGATIVE
Glucose, UA: NEGATIVE mg/dL
Ketones, ur: NEGATIVE mg/dL
Leukocytes, UA: NEGATIVE
NITRITE: NEGATIVE
Protein, ur: NEGATIVE mg/dL
SPECIFIC GRAVITY, URINE: 1.023 (ref 1.005–1.030)
pH: 6 (ref 5.0–8.0)

## 2015-10-25 LAB — URINE MICROSCOPIC-ADD ON

## 2015-10-25 LAB — TROPONIN I: Troponin I: <0.03 ng/mL (ref <0.03)

## 2015-10-25 LAB — ETHANOL: Alcohol, Ethyl (B): <5 mg/dL (ref <5)

## 2015-10-25 NOTE — ED Provider Notes (Signed)
[place] DEPT Provider Note   CSN: [id number] Arrival date & time: 10/25/15  0028  First Provider Contact:  First MD Initiated Contact with Patient 10/25/15 0032     By signing my name below, I, Soijett Blue, attest that this documentation has been prepared under the direction and in the presence of Ripley Fraise, MD. Electronically Signed: Soijett Blue, ED Scribe. 10/25/15. 12:44 AM.   History   Chief Complaint Chief Complaint  Patient presents with  . Weakness    HPI [name] is a 82 y.o. female with a medical hx of DM, who presents to the Emergency Department via EMS complaining of intermittent generalized weakness onset 1 week ago. Pt states that she was asleep tonight and awoke to EMS, who was called by her granddaughter. Pt denies any new medications or recent falls. Pt states that she lives with her granddaughter. Pt is having associated symptoms of vomiting x yesterday morning. She notes that she has not tried any medications for the relief of her symptoms. She denies HA, CP, cough, SOB, abdominal pain, diarrhea, dysuria, leg swelling, LOC, numbness, and any other symptoms.    The history is provided by the patient and the EMS personnel. No language interpreter was used.  Weakness  This is a recurrent problem. The current episode started more than 1 week ago. The problem has not changed since onset.Pertinent negatives include no chest pain, no abdominal pain, no headaches and no shortness of breath. Nothing aggravates the symptoms. Nothing relieves the symptoms. She has tried nothing for the symptoms. The treatment provided no relief.    Past Medical History:  Diagnosis Date  . Asthma   . Diabetes mellitus     Patient Active Problem List   Diagnosis Date Noted  . Diabetes mellitus ([place]) 04/09/2012  . Nausea & vomiting 04/09/2012  . Gallstones 03/15/2011  . Partial SBO 03/15/2011  . Colostomy hernia ([place]) 03/15/2011  . PSEUDOGOUT 02/28/2007  . IRON DEFIC  ANEMIA [place] DIET IRON INTAKE 02/28/2007  . DISCITIS 02/28/2007  . OSTEOMYELITIS 02/28/2007  . CHILLS WITHOUT FEVER 02/28/2007    History reviewed. No pertinent surgical history.  OB History    No data available       Home Medications    Prior to Admission medications   Medication Sig Start Date End Date Taking? Authorizing Provider  alendronate (FOSAMAX) 70 MG tablet Take 70 mg by mouth once a week. Wednesday   Yes Historical Provider, MD  amLODipine (NORVASC) 5 MG tablet Take 1 tablet (5 mg total) by mouth daily. 04/12/12  Yes Seward Carol, MD  donepezil (ARICEPT) 10 MG tablet Take 10 mg by mouth daily. 02/24/15  Yes Historical Provider, MD  gabapentin (NEURONTIN) 300 MG capsule Take 300 mg by mouth 3 (three) times daily. 10/12/15  Yes Historical Provider, MD  glipiZIDE (GLUCOTROL XL) 5 MG 24 hr tablet Take 5 mg by mouth daily.     Yes Historical Provider, MD  NAMENDA XR 28 MG CP24 24 hr capsule Take 28 mg by mouth daily. 02/27/15  Yes Historical Provider, MD  probenecid (BENEMID) 500 MG tablet Take 250 mg by mouth daily.    Yes Historical Provider, MD  VITAMIN A PO Take 1 tablet by mouth daily.   Yes Historical Provider, MD    Family History History reviewed. No pertinent family history.  Social History Social History  Substance Use Topics  . Smoking status: Never Smoker  . Smokeless tobacco: Never Used  . Alcohol use No  Allergies   Review of patient's allergies indicates no known allergies.   Review of Systems Review of Systems  Respiratory: Negative for shortness of breath.   Cardiovascular: Negative for chest pain.  Gastrointestinal: Negative for abdominal pain.  Neurological: Positive for weakness. Negative for headaches.  All other systems reviewed and are negative.    Physical Exam Updated Vital Signs BP 147/85   Pulse (!) 56   Temp 97.8 F (36.6 C) (Oral)   Resp 14   SpO2 100%   Physical Exam  CONSTITUTIONAL: Elderly, frail HEAD:  Normocephalic/atraumatic EYES: EOMI/PERRL, no nystagmus, no ptosis. Pupils pinpoint ENMT: Mucous membranes moist NECK: supple no meningeal signs, no bruits CV: S1/S2 noted, no murmurs/rubs/gallops noted LUNGS: Lungs are clear to auscultation bilaterally, no apparent distress ABDOMEN: soft, nontender, no rebound or guarding. Colostomy bag in place.  GU:no cva tenderness NEURO:Awake/alert, face symmetric, no arm or leg drift is noted Equal 5/5 strength with shoulder abduction,  Equal 5/5 strength with hip flexion,knee flex/extension, foot dorsi/plantar flexion Cranial nerves 3/4/5/6/09/21/08/11/12 tested and intact Sensation to light touch intact in all extremities EXTREMITIES: pulses normal, full ROM SKIN: warm, color normal PSYCH: no abnormalities of mood noted   ED Treatments / Results  DIAGNOSTIC STUDIES: Oxygen Saturation is 99% on RA, nl by my interpretation.    COORDINATION OF CARE: 12:42 AM Discussed treatment plan with pt at bedside which includes EKG, labs, and UA, and pt agreed to plan.   Labs (all labs ordered are listed, but only abnormal results are displayed) Labs Reviewed  COMPREHENSIVE METABOLIC PANEL - Abnormal; Notable for the following:       Result Value   Glucose, Bld 107 (*)    BUN 25 (*)    All other components within normal limits  URINALYSIS, ROUTINE W REFLEX MICROSCOPIC (NOT AT [facility]) - Abnormal; Notable for the following:    Hgb urine dipstick TRACE (*)    All other components within normal limits  URINE MICROSCOPIC-ADD ON - Abnormal; Notable for the following:    Squamous Epithelial / LPF 0-5 (*)    Bacteria, UA RARE (*)    All other components within normal limits  ETHANOL  CBC  DIFFERENTIAL  URINE RAPID DRUG SCREEN, HOSP PERFORMED  TROPONIN I    EKG  EKG Interpretation  Date/Time:  Friday October 25 2015 00:34:23 EDT Ventricular Rate:  57 PR Interval:    QRS Duration: 99 QT Interval:  435 QTC Calculation: 424 R Axis:   -37 Text  Interpretation:  Sinus rhythm Left axis deviation No significant change since last tracing Confirmed by Christy Gentles  MD, Ronie Fleeger ([id number]) on 10/25/2015 12:39:39 AM       Radiology No results found.  Procedures Procedures (including critical care time)  Medications Ordered in ED Medications - No data to display   Initial Impression / Assessment and Plan / ED Course  I have reviewed the triage vital signs and the nursing notes.  Pertinent labs & imaging results that were available during my care of the patient were reviewed by me and considered in my medical decision making (see chart for details).  Clinical Course    Pt monitored for several hours She slept most of the night No focal weakness She ambulated Labs reassuring Multiple attempts to call family were not successful It is unclear why she is here as she reports she was asleep at home and woke up with paramedics in her house at request of family She is likely at baseline Will d/c home  She is comfortable with this plan Nursing has secured cab voucher at discharge   Final Clinical Impressions(s) / ED Diagnoses   Final diagnoses:  Weakness    New Prescriptions New Prescriptions   No medications on file   I personally performed the services described in this documentation, which was scribed in my presence. The recorded information has been reviewed and is accurate.         Ripley Fraise, MD 10/25/15 [phone]

## 2015-10-25 NOTE — Discharge Instructions (Signed)

## 2015-10-25 NOTE — ED Triage Notes (Signed)
Pt arrived to ED via EMS. Family c/o gradual weakness. States she has been feeling weak lately. Weakness occurred over a week ago. No c/o pain. EKG shows possible borderline 1st degree block.  CBG-163. No diarrhea or nausea.

## 2015-12-27 ENCOUNTER — Ambulatory Visit (HOSPITAL_COMMUNITY)
Admission: RE | Admit: 2015-12-27 | Discharge: 2015-12-27 | Disposition: A | Discharge status: Home / Self Care | Payer: Medicare Other | Source: Ambulatory Visit | Attending: Internal Medicine | Admitting: Internal Medicine

## 2015-12-27 ENCOUNTER — Other Ambulatory Visit (HOSPITAL_COMMUNITY): Payer: Self-pay | Admitting: Internal Medicine

## 2015-12-27 DIAGNOSIS — R609 Edema, unspecified: Secondary | ICD-10-CM

## 2015-12-27 DIAGNOSIS — M7989 Other specified soft tissue disorders: Secondary | ICD-10-CM | POA: Insufficient documentation

## 2015-12-27 NOTE — Progress Notes (Signed)
**  Preliminary report by tech**  Left upper extremity venous duplex completed. There is no evidence of deep or superficial vein thrombosis involving the left upper extremity. All visualized vessels appear patent.  Results were given to Dr. Delfina Redwood.  12/27/15 1:07 PM [name] RVT

## 2016-04-05 ENCOUNTER — Emergency Department (HOSPITAL_COMMUNITY)
Admission: EM | Admit: 2016-04-05 | Discharge: 2016-04-06 | Disposition: A | Discharge status: Home / Self Care | Payer: Medicare Other | Attending: Emergency Medicine | Admitting: Emergency Medicine

## 2016-04-05 ENCOUNTER — Encounter (HOSPITAL_COMMUNITY): Payer: Self-pay | Admitting: Emergency Medicine

## 2016-04-05 DIAGNOSIS — Z7984 Long term (current) use of oral hypoglycemic drugs: Secondary | ICD-10-CM | POA: Diagnosis not present

## 2016-04-05 DIAGNOSIS — M79662 Pain in left lower leg: Secondary | ICD-10-CM | POA: Diagnosis present

## 2016-04-05 DIAGNOSIS — R609 Edema, unspecified: Secondary | ICD-10-CM | POA: Diagnosis not present

## 2016-04-05 DIAGNOSIS — E119 Type 2 diabetes mellitus without complications: Secondary | ICD-10-CM | POA: Diagnosis not present

## 2016-04-05 DIAGNOSIS — Z79899 Other long term (current) drug therapy: Secondary | ICD-10-CM | POA: Insufficient documentation

## 2016-04-05 DIAGNOSIS — J45909 Unspecified asthma, uncomplicated: Secondary | ICD-10-CM | POA: Diagnosis not present

## 2016-04-05 MED ORDER — IBUPROFEN 400 MG PO TABS
400.0000 mg | ORAL_TABLET | Freq: Once | ORAL | Status: AC
  Administered 2016-04-05: 400 mg via ORAL
  Filled 2016-04-05: qty 1

## 2016-04-05 NOTE — ED Provider Notes (Signed)
[place] DEPT Provider Note   CSN: [id number] Arrival date & time: 04/05/16  2227     History   Chief Complaint No chief complaint on file.   HPI [name] is a 83 y.o. female.  She complains of pain in her left calf tonight. She is rather poor historian. EMS note states that she's been complaining of pains in her arms and legs for the last week. Patient states that she is only been having pain in her left leg and it definitely got worse tonight. She had had some mild discomfort in her right forearm as well. She denies any trauma. She denies any chest pain or dyspnea. She has not taken anything for pain. Nothing makes it better nothing makes it worse. She cannot put a number on the pain but just states that it is not hurting that badly right now.   The history is provided by the patient and the EMS personnel.    Past Medical History:  Diagnosis Date  . Asthma   . Diabetes mellitus     Patient Active Problem List   Diagnosis Date Noted  . Diabetes mellitus ([place]) 04/09/2012  . Nausea & vomiting 04/09/2012  . Gallstones 03/15/2011  . Partial SBO 03/15/2011  . Colostomy hernia ([place]) 03/15/2011  . PSEUDOGOUT 02/28/2007  . IRON DEFIC ANEMIA [place] DIET IRON INTAKE 02/28/2007  . DISCITIS 02/28/2007  . OSTEOMYELITIS 02/28/2007  . CHILLS WITHOUT FEVER 02/28/2007    History reviewed. No pertinent surgical history.  OB History    No data available       Home Medications    Prior to Admission medications   Medication Sig Start Date End Date Taking? Authorizing Provider  alendronate (FOSAMAX) 70 MG tablet Take 70 mg by mouth once a week. Wednesday    Historical Provider, MD  amLODipine (NORVASC) 5 MG tablet Take 1 tablet (5 mg total) by mouth daily. 04/12/12   Seward Carol, MD  donepezil (ARICEPT) 10 MG tablet Take 10 mg by mouth daily. 02/24/15   Historical Provider, MD  gabapentin (NEURONTIN) 300 MG capsule Take 300 mg by mouth 3 (three) times daily. 10/12/15    Historical Provider, MD  glipiZIDE (GLUCOTROL XL) 5 MG 24 hr tablet Take 5 mg by mouth daily.      Historical Provider, MD  NAMENDA XR 28 MG CP24 24 hr capsule Take 28 mg by mouth daily. 02/27/15   Historical Provider, MD  probenecid (BENEMID) 500 MG tablet Take 250 mg by mouth daily.     Historical Provider, MD  VITAMIN A PO Take 1 tablet by mouth daily.    Historical Provider, MD    Family History No family history on file.  Social History Social History  Substance Use Topics  . Smoking status: Never Smoker  . Smokeless tobacco: Never Used  . Alcohol use No     Allergies   Patient has no known allergies.   Review of Systems Review of Systems  All other systems reviewed and are negative.    Physical Exam Updated Vital Signs BP (!) 118/104   Pulse 80   Ht 5\' 2"  (1.575 m)   SpO2 99%   Physical Exam  Nursing note and vitals reviewed.  83 year old female, resting comfortably and in no acute distress. Vital signs are normal. Oxygen saturation is 99%, which is normal. Head is normocephalic and atraumatic. PERRLA, EOMI. Oropharynx is clear. Neck is nontender and supple without adenopathy or JVD. Back is nontender and there  is no CVA tenderness. Lungs are clear without rales, wheezes, or rhonchi. Chest is nontender. Heart has regular rate and rhythm without murmur. Abdomen is soft, flat, nontender without masses or hepatosplenomegaly and peristalsis is normoactive. Extremities have 2+ edema, full range of motion is present. Circumference is equal. There is mild tenderness to palpation over the left mid calf. Homans sign is negative. Skin is warm and dry without rash. Neurologic: Mental status is normal, cranial nerves are intact, there are no motor or sensory deficits.  ED Treatments / Results  Labs (all labs ordered are listed, but only abnormal results are displayed) Labs Reviewed  BASIC METABOLIC PANEL - Abnormal; Notable for the following:       Result Value    CO2 21 (*)    Glucose, Bld 216 (*)    Creatinine, Ser 1.11 (*)    GFR calc non Af Amer 46 (*)    GFR calc Af Amer 53 (*)    All other components within normal limits  URINALYSIS, ROUTINE W REFLEX MICROSCOPIC - Abnormal; Notable for the following:    Hgb urine dipstick SMALL (*)    Leukocytes, UA TRACE (*)    Squamous Epithelial / LPF 0-5 (*)    All other components within normal limits  HEPATIC FUNCTION PANEL - Abnormal; Notable for the following:    Albumin 3.1 (*)    Indirect Bilirubin 0.2 (*)    All other components within normal limits  DIFFERENTIAL - Abnormal; Notable for the following:    Monocytes Absolute 1.1 (*)    All other components within normal limits  CBC  CK    Procedures Procedures (including critical care time)  Medications Ordered in ED Medications  ibuprofen (ADVIL,MOTRIN) tablet 400 mg (not administered)     Initial Impression / Assessment and Plan / ED Course  I have reviewed the triage vital signs and the nursing notes.  Pertinent lab results that were available during my care of the patient were reviewed by me and considered in my medical decision making (see chart for details).  Left lower extremity pain with moderate peripheral edema. Review of old records shows no similar past visits, but several visits for weakness. No mention of edema on prior physical exams. Renal function has been normal in the past. Screening labs are obtained and she will be given a dose of ibuprofen.  Laboratory workup is unremarkable. She has normal CK and normal electrolytes. Urinalysis is normal. Following ibuprofen, she is feeling much better. She is discharged with instructions to take over-the-counter ibuprofen. Advised to try to limit her salt intake.  Final Clinical Impressions(s) / ED Diagnoses   Final diagnoses:  Pain of left calf  Peripheral edema    New Prescriptions New Prescriptions   No medications on file     Delora Fuel, MD 50/35/46 [id number]

## 2016-04-05 NOTE — ED Notes (Signed)
Pt states cramps have been going on for over a week but now it is hurting tonight.

## 2016-04-05 NOTE — ED Triage Notes (Signed)
Per [facility]  Intermittent arm and leg cramps for approx 1 week. Family reports pt does drink enough fluids but just enough to take with her meds.

## 2016-04-05 NOTE — ED Triage Notes (Signed)
Pt is not orthostatic according to [facility].

## 2016-04-06 LAB — BASIC METABOLIC PANEL
Anion gap: 13 (ref 5–15)
BUN: 19 mg/dL (ref 6–20)
CO2: 21 mmol/L — ABNORMAL LOW (ref 22–32)
CREATININE: 1.11 mg/dL — AB (ref 0.44–1.00)
Calcium: 9.4 mg/dL (ref 8.9–10.3)
Chloride: 107 mmol/L (ref 101–111)
GFR calc Af Amer: 53 mL/min — ABNORMAL LOW (ref >60)
GFR calc non Af Amer: 46 mL/min — ABNORMAL LOW (ref >60)
GLUCOSE: 216 mg/dL — AB (ref 65–99)
Potassium: 4.4 mmol/L (ref 3.5–5.1)
SODIUM: 141 mmol/L (ref 135–145)

## 2016-04-06 LAB — CK: CK TOTAL: 101 U/L (ref 38–234)

## 2016-04-06 LAB — CBC
HCT: 42.8 % (ref 36.0–46.0)
Hemoglobin: 13.4 g/dL (ref 12.0–15.0)
MCH: 30 pg (ref 26.0–34.0)
MCHC: 31.3 g/dL (ref 30.0–36.0)
MCV: 95.7 fL (ref 78.0–100.0)
PLATELETS: 168 10*3/uL (ref 150–400)
RBC: 4.47 MIL/uL (ref 3.87–5.11)
RDW: 14.5 % (ref 11.5–15.5)
WBC: 8.5 10*3/uL (ref 4.0–10.5)

## 2016-04-06 LAB — HEPATIC FUNCTION PANEL
ALT: 19 U/L (ref 14–54)
AST: 26 U/L (ref 15–41)
Albumin: 3.1 g/dL — ABNORMAL LOW (ref 3.5–5.0)
Alkaline Phosphatase: 78 U/L (ref 38–126)
BILIRUBIN INDIRECT: 0.2 mg/dL — AB (ref 0.3–0.9)
Bilirubin, Direct: 0.2 mg/dL (ref 0.1–0.5)
TOTAL PROTEIN: 6.5 g/dL (ref 6.5–8.1)
Total Bilirubin: 0.4 mg/dL (ref 0.3–1.2)

## 2016-04-06 LAB — URINALYSIS, ROUTINE W REFLEX MICROSCOPIC
BACTERIA UA: NONE SEEN
BILIRUBIN URINE: NEGATIVE
Glucose, UA: NEGATIVE mg/dL
KETONES UR: NEGATIVE mg/dL
Nitrite: NEGATIVE
PROTEIN: NEGATIVE mg/dL
SPECIFIC GRAVITY, URINE: 1.024 (ref 1.005–1.030)
pH: 5 (ref 5.0–8.0)

## 2016-04-06 LAB — DIFFERENTIAL
BASOS ABS: 0 10*3/uL (ref 0.0–0.1)
Basophils Relative: 0 %
Eosinophils Absolute: 0.1 10*3/uL (ref 0.0–0.7)
Eosinophils Relative: 1 %
Lymphocytes Relative: 18 %
Lymphs Abs: 1.5 10*3/uL (ref 0.7–4.0)
Monocytes Absolute: 1.1 10*3/uL — ABNORMAL HIGH (ref 0.1–1.0)
Monocytes Relative: 13 %
NEUTROS ABS: 5.7 10*3/uL (ref 1.7–7.7)
NEUTROS PCT: 68 %

## 2016-04-06 NOTE — Discharge Instructions (Signed)
Try to limit your salt intake.  Take ibuprofen 400 mg (two tablets) every six hours as needed for pain.

## 2016-04-06 NOTE — ED Notes (Signed)
Patient given taxi voucher.

## 2016-05-07 ENCOUNTER — Other Ambulatory Visit: Payer: Self-pay | Admitting: Internal Medicine

## 2016-05-07 DIAGNOSIS — Z1231 Encounter for screening mammogram for malignant neoplasm of breast: Secondary | ICD-10-CM

## 2016-06-15 ENCOUNTER — Ambulatory Visit
Admission: RE | Admit: 2016-06-15 | Discharge: 2016-06-15 | Disposition: A | Discharge status: Home / Self Care | Payer: Medicare Other | Source: Ambulatory Visit | Attending: Internal Medicine | Admitting: Internal Medicine

## 2016-06-15 DIAGNOSIS — Z1231 Encounter for screening mammogram for malignant neoplasm of breast: Secondary | ICD-10-CM

## 2016-06-18 ENCOUNTER — Ambulatory Visit: Payer: Medicare Other

## 2016-09-04 ENCOUNTER — Encounter (HOSPITAL_COMMUNITY): Payer: Self-pay | Admitting: Emergency Medicine

## 2016-09-04 ENCOUNTER — Inpatient Hospital Stay (HOSPITAL_COMMUNITY)
Admission: EM | Admit: 2016-09-04 | Discharge: 2016-09-09 | DRG: 917 | Disposition: A | Discharge status: Home Health | Payer: Medicare Other | Attending: Internal Medicine | Admitting: Internal Medicine

## 2016-09-04 DIAGNOSIS — E118 Type 2 diabetes mellitus with unspecified complications: Secondary | ICD-10-CM

## 2016-09-04 DIAGNOSIS — R509 Fever, unspecified: Secondary | ICD-10-CM

## 2016-09-04 DIAGNOSIS — Z7983 Long term (current) use of bisphosphonates: Secondary | ICD-10-CM

## 2016-09-04 DIAGNOSIS — R4182 Altered mental status, unspecified: Secondary | ICD-10-CM

## 2016-09-04 DIAGNOSIS — E119 Type 2 diabetes mellitus without complications: Secondary | ICD-10-CM | POA: Diagnosis present

## 2016-09-04 DIAGNOSIS — Z7984 Long term (current) use of oral hypoglycemic drugs: Secondary | ICD-10-CM

## 2016-09-04 DIAGNOSIS — G92 Toxic encephalopathy: Secondary | ICD-10-CM | POA: Diagnosis present

## 2016-09-04 DIAGNOSIS — A419 Sepsis, unspecified organism: Secondary | ICD-10-CM | POA: Diagnosis present

## 2016-09-04 DIAGNOSIS — J189 Pneumonia, unspecified organism: Secondary | ICD-10-CM | POA: Diagnosis present

## 2016-09-04 DIAGNOSIS — R6510 Systemic inflammatory response syndrome (SIRS) of non-infectious origin without acute organ dysfunction: Secondary | ICD-10-CM | POA: Diagnosis present

## 2016-09-04 DIAGNOSIS — J181 Lobar pneumonia, unspecified organism: Secondary | ICD-10-CM

## 2016-09-04 DIAGNOSIS — I110 Hypertensive heart disease with heart failure: Secondary | ICD-10-CM | POA: Diagnosis present

## 2016-09-04 DIAGNOSIS — F0390 Unspecified dementia without behavioral disturbance: Secondary | ICD-10-CM | POA: Diagnosis present

## 2016-09-04 DIAGNOSIS — G9340 Encephalopathy, unspecified: Secondary | ICD-10-CM

## 2016-09-04 DIAGNOSIS — N179 Acute kidney failure, unspecified: Secondary | ICD-10-CM | POA: Diagnosis present

## 2016-09-04 DIAGNOSIS — T424X1A Poisoning by benzodiazepines, accidental (unintentional), initial encounter: Principal | ICD-10-CM | POA: Diagnosis present

## 2016-09-04 DIAGNOSIS — N390 Urinary tract infection, site not specified: Secondary | ICD-10-CM | POA: Diagnosis present

## 2016-09-04 DIAGNOSIS — I444 Left anterior fascicular block: Secondary | ICD-10-CM | POA: Diagnosis present

## 2016-09-04 DIAGNOSIS — J45901 Unspecified asthma with (acute) exacerbation: Secondary | ICD-10-CM | POA: Diagnosis present

## 2016-09-04 DIAGNOSIS — I5033 Acute on chronic diastolic (congestive) heart failure: Secondary | ICD-10-CM | POA: Diagnosis present

## 2016-09-04 NOTE — ED Triage Notes (Addendum)
Per [facility]: Pt to ED from home for possible UTI. EMS initially called out to home by patient's granddaughter because "Grandma overdosed and was not acting right." Pt's granddaughter stated that all meds are locked up and checked them all with EMS - all meds accounted for. Upon EMS arrival, pt appears to have taken something she thought a Tylenol. Pt states she took a long, white pill that was wrapped in a piece of paper in a drawer. Pupils are pinpoint. Per patient's granddaughter, pt has been c/o side pain and has had a foul urine odor x 2 days. Pt reports she took the "Tylenol" because of hand pain. Pt changes answers frequently - alert and oriented to self, time, and place, somewhat oriented to situation. En route, pt RR 13, 93% RA - 98% on 4L O2, P 78 NSR, CBG 140. Pt denies pain, no fevers/chills. Resp e/u, skin warm/dry.

## 2016-09-04 NOTE — ED Provider Notes (Signed)
By signing my name below, I, Jeanell Sparrow, attest that this documentation has been prepared under the direction and in the presence of Mancel Lardizabal, Delice Bison, DO. Electronically Signed: Jeanell Sparrow, Scribe. 09/04/2016. 12:18 AM.  TIME SEEN: 12:18 AM  CHIEF COMPLAINT: UTI  LEVEL 5 CAVEAT DUE TO MENTAL STATUS CHANGE  HPI:  HPI Comments: [name] is a 83 y.o. female with a PMHx of DM and asthma who presents to the Emergency Department complaining of confusion that started today. She reports she took unknown medication she found in the drawer wrapped in paper. She has speech difficulty during hx. Per nurse's note, granddaughter had malodorous urine for the past 2 days. No family at bedside. Brought in by EMS.  PCP is Dr. Delfina Redwood   ROS: Level V caveat due to altered mental status  PAST MEDICAL HISTORY/PAST SURGICAL HISTORY:  Past Medical History:  Diagnosis Date  . Asthma   . Diabetes mellitus     MEDICATIONS:  Prior to Admission medications   Medication Sig Start Date End Date Taking? Authorizing Provider  albuterol (PROVENTIL HFA;VENTOLIN HFA) 108 (90 Base) MCG/ACT inhaler Inhale 1-2 puffs into the lungs every 6 (six) hours as needed for wheezing or shortness of breath.    [provider]  alendronate (FOSAMAX) 70 MG tablet Take 70 mg by mouth once a week. Wednesday    [provider]  amLODipine (NORVASC) 5 MG tablet Take 1 tablet (5 mg total) by mouth daily. 04/12/12   Seward Carol, MD  donepezil (ARICEPT) 10 MG tablet Take 10 mg by mouth daily. 02/24/15   [provider]  gabapentin (NEURONTIN) 300 MG capsule Take 300 mg by mouth 3 (three) times daily. 10/12/15   [provider]  glipiZIDE (GLUCOTROL XL) 5 MG 24 hr tablet Take 5 mg by mouth daily.      [provider]  NAMENDA XR 28 MG CP24 24 hr capsule Take 28 mg by mouth daily. 02/27/15   [provider]  probenecid (BENEMID) 500 MG tablet Take 250 mg by mouth daily.      [provider]  VITAMIN A PO Take 1 tablet by mouth daily.    [provider]    ALLERGIES:  No Known Allergies  SOCIAL HISTORY:  Social History  Substance Use Topics  . Smoking status: Never Smoker  . Smokeless tobacco: Never Used  . Alcohol use No    FAMILY HISTORY: No family history on file.  EXAM: BP 127/72   Pulse 77   Temp (!) 101.3 F (38.5 C) (Rectal)   Resp 20   SpO2 97%  CONSTITUTIONAL: Alert and oriented To person and place but not time and responds appropriately to questions only intermittently. Elderly. Afebrile. Nontoxic. HEAD: Normocephalic, atraumatic EYES: Conjunctivae clear, pupils appear equal, EOMI, pinpoint pupils bilaterally  ENT: normal nose; moist mucous membranes, poor dentition NECK: Supple, no meningismus, no nuchal rigidity, no LAD  CARD: RRR; S1 and S2 appreciated; no murmurs, no clicks, no rubs, no gallops RESP: Normal chest excursion without splinting or tachypnea; breath sounds clear and equal bilaterally; no wheezes, no rhonchi, no rales, no hypoxia or respiratory distress, speaking full sentences ABD/GI: Normal bowel sounds; non-distended; soft, non-tender, no rebound, no guarding, no peritoneal signs, no hepatosplenomegaly BACK:  The back appears normal and is non-tender to palpation, there is no CVA tenderness EXT: Normal ROM in all joints; non-tender to palpation; no edema; normal capillary refill; no cyanosis, no calf tenderness or swelling  SKIN: Normal color for age and race; warm; no rash, colostomy in LLQ NEURO: Moves all extremities equally, intermittent garbled speech, no pronator drift, cranial nerves 2 through 12 intact, reports sensation to light touch intact diffusely, drowsy but arousable PSYCH: The patient's mood and manner are appropriate. Grooming and personal hygiene are appropriate.    MEDICAL DECISION MAKING: Patient here with altered status. Found to be febrile. Could sepsis initiated. We'll obtain  labs, cultures, urine, chest x-ray and head CT. Unknown last normal. She has no focal neurologic deficits on exam other than intermittent garbled speech. She does have pinpoint pupils and this could be related to the medication that she took that was unknown. We'll give her a dose of Narcan and reassess. We'll give IV fluids, broad-spectrum antibiotics.  ED PROGRESS: 2:15 AM  Patient's labs show mild leukocytosis with left shift. Normal lactate. Negative troponin. Chest x-ray shows a left lower lobe pneumonia. After Narcan patient is now more awake, responsive. She still disoriented to time but her speech is clear. Head CT pending. Urinalysis pending. Anticipate admission. She remains hemodynamically stable.    3:25 AM  Pt's head CT shows no acute abnormalities. Her drug screen is positive for barbiturates which it does not appear she is prescribed. Urine shows no sign of infection. We'll discuss with medicine for admission for pneumonia.  Was initially on oxygen on arrival but sats now between 92-94% on room air. Does not wear oxygen at home.   4:11 AM Discussed patient's case with hospitalist, Dr. Tamala Julian.  I have recommended admission and patient (and family if present) agree with this plan. Admitting physician will place admission orders.   I reviewed all nursing notes, vitals, pertinent previous records, EKGs, lab and urine results, imaging (as available).     EKG Interpretation  Date/Time:  Friday September 04 2016 23:57:04 EDT Ventricular Rate:  71 PR Interval:    QRS Duration: 94 QT Interval:  385 QTC Calculation: 419 R Axis:   -63 Text Interpretation:  Sinus rhythm Left anterior fascicular block Abnormal R-wave progression, early transition No significant change since last tracing Confirmed by Eladio Dentremont, Cyril Mourning [phone]) on 09/04/2016 11:58:45 PM       CRITICAL CARE Performed by: Nyra Jabs   Total critical care time: 45 minutes  Critical care time was exclusive of separately  billable procedures and treating other patients.  Critical care was necessary to treat or prevent imminent or life-threatening deterioration.  Critical care was time spent personally by me on the following activities: development of treatment plan with patient and/or surrogate as well as nursing, discussions with consultants, evaluation of patient's response to treatment, examination of patient, obtaining history from patient or surrogate, ordering and performing treatments and interventions, ordering and review of laboratory studies, ordering and review of radiographic studies, pulse oximetry and re-evaluation of patient's condition.  I personally performed the services described in this documentation, which was scribed in my presence. The recorded information has been reviewed and is accurate.     Jolee Critcher, Delice Bison, DO 09/05/16 [phone]

## 2016-09-05 ENCOUNTER — Emergency Department (HOSPITAL_COMMUNITY): Payer: Medicare Other

## 2016-09-05 ENCOUNTER — Inpatient Hospital Stay (HOSPITAL_COMMUNITY): Payer: Medicare Other

## 2016-09-05 DIAGNOSIS — I444 Left anterior fascicular block: Secondary | ICD-10-CM | POA: Diagnosis present

## 2016-09-05 DIAGNOSIS — Z7984 Long term (current) use of oral hypoglycemic drugs: Secondary | ICD-10-CM | POA: Diagnosis not present

## 2016-09-05 DIAGNOSIS — E119 Type 2 diabetes mellitus without complications: Secondary | ICD-10-CM | POA: Diagnosis present

## 2016-09-05 DIAGNOSIS — N179 Acute kidney failure, unspecified: Secondary | ICD-10-CM | POA: Diagnosis present

## 2016-09-05 DIAGNOSIS — I5033 Acute on chronic diastolic (congestive) heart failure: Secondary | ICD-10-CM | POA: Diagnosis present

## 2016-09-05 DIAGNOSIS — G9340 Encephalopathy, unspecified: Secondary | ICD-10-CM | POA: Diagnosis not present

## 2016-09-05 DIAGNOSIS — J189 Pneumonia, unspecified organism: Secondary | ICD-10-CM | POA: Diagnosis present

## 2016-09-05 DIAGNOSIS — G92 Toxic encephalopathy: Secondary | ICD-10-CM | POA: Diagnosis present

## 2016-09-05 DIAGNOSIS — J45901 Unspecified asthma with (acute) exacerbation: Secondary | ICD-10-CM | POA: Diagnosis present

## 2016-09-05 DIAGNOSIS — I110 Hypertensive heart disease with heart failure: Secondary | ICD-10-CM | POA: Diagnosis present

## 2016-09-05 DIAGNOSIS — T424X1A Poisoning by benzodiazepines, accidental (unintentional), initial encounter: Secondary | ICD-10-CM | POA: Diagnosis present

## 2016-09-05 DIAGNOSIS — R6510 Systemic inflammatory response syndrome (SIRS) of non-infectious origin without acute organ dysfunction: Secondary | ICD-10-CM | POA: Diagnosis present

## 2016-09-05 DIAGNOSIS — R4182 Altered mental status, unspecified: Secondary | ICD-10-CM

## 2016-09-05 DIAGNOSIS — Z7983 Long term (current) use of bisphosphonates: Secondary | ICD-10-CM | POA: Diagnosis not present

## 2016-09-05 DIAGNOSIS — J181 Lobar pneumonia, unspecified organism: Secondary | ICD-10-CM | POA: Diagnosis not present

## 2016-09-05 DIAGNOSIS — E118 Type 2 diabetes mellitus with unspecified complications: Secondary | ICD-10-CM

## 2016-09-05 DIAGNOSIS — F0390 Unspecified dementia without behavioral disturbance: Secondary | ICD-10-CM | POA: Diagnosis present

## 2016-09-05 DIAGNOSIS — R509 Fever, unspecified: Secondary | ICD-10-CM | POA: Diagnosis present

## 2016-09-05 DIAGNOSIS — A419 Sepsis, unspecified organism: Secondary | ICD-10-CM | POA: Diagnosis not present

## 2016-09-05 DIAGNOSIS — N390 Urinary tract infection, site not specified: Secondary | ICD-10-CM | POA: Diagnosis present

## 2016-09-05 LAB — CBC WITH DIFFERENTIAL/PLATELET
BASOS ABS: 0 10*3/uL (ref 0.0–0.1)
BASOS PCT: 0 %
Eosinophils Absolute: 0.1 10*3/uL (ref 0.0–0.7)
Eosinophils Relative: 0 %
HEMATOCRIT: 40.8 % (ref 36.0–46.0)
HEMOGLOBIN: 12.9 g/dL (ref 12.0–15.0)
Lymphocytes Relative: 17 %
Lymphs Abs: 2.3 10*3/uL (ref 0.7–4.0)
MCH: 30.9 pg (ref 26.0–34.0)
MCHC: 31.6 g/dL (ref 30.0–36.0)
MCV: 97.8 fL (ref 78.0–100.0)
MONO ABS: 0.9 10*3/uL (ref 0.1–1.0)
Monocytes Relative: 7 %
NEUTROS ABS: 10.7 10*3/uL — AB (ref 1.7–7.7)
NEUTROS PCT: 76 %
Platelets: 203 10*3/uL (ref 150–400)
RBC: 4.17 MIL/uL (ref 3.87–5.11)
RDW: 14.4 % (ref 11.5–15.5)
WBC: 14 10*3/uL — ABNORMAL HIGH (ref 4.0–10.5)

## 2016-09-05 LAB — URINALYSIS, ROUTINE W REFLEX MICROSCOPIC
BACTERIA UA: NONE SEEN
Bilirubin Urine: NEGATIVE
GLUCOSE, UA: NEGATIVE mg/dL
KETONES UR: NEGATIVE mg/dL
Leukocytes, UA: NEGATIVE
NITRITE: NEGATIVE
PROTEIN: NEGATIVE mg/dL
Specific Gravity, Urine: 1.018 (ref 1.005–1.030)
pH: 5 (ref 5.0–8.0)

## 2016-09-05 LAB — BLOOD GAS, ARTERIAL
Acid-Base Excess: 0.2 mmol/L (ref 0.0–2.0)
Bicarbonate: 25.3 mmol/L (ref 20.0–28.0)
Drawn by: 244851
O2 CONTENT: 2 L/min
O2 SAT: 97.2 %
PATIENT TEMPERATURE: 98.6
pCO2 arterial: 47.9 mmHg (ref 32.0–48.0)
pH, Arterial: 7.342 — ABNORMAL LOW (ref 7.350–7.450)
pO2, Arterial: 105 mmHg (ref 83.0–108.0)

## 2016-09-05 LAB — RAPID URINE DRUG SCREEN, HOSP PERFORMED
AMPHETAMINES: NOT DETECTED
BARBITURATES: POSITIVE — AB
Benzodiazepines: NOT DETECTED
Cocaine: NOT DETECTED
OPIATES: NOT DETECTED
TETRAHYDROCANNABINOL: NOT DETECTED

## 2016-09-05 LAB — COMPREHENSIVE METABOLIC PANEL
ALBUMIN: 3.5 g/dL (ref 3.5–5.0)
ALT: 17 U/L (ref 14–54)
ANION GAP: 12 (ref 5–15)
AST: 22 U/L (ref 15–41)
Alkaline Phosphatase: 75 U/L (ref 38–126)
BUN: 22 mg/dL — ABNORMAL HIGH (ref 6–20)
CALCIUM: 9.2 mg/dL (ref 8.9–10.3)
CO2: 26 mmol/L (ref 22–32)
Chloride: 104 mmol/L (ref 101–111)
Creatinine, Ser: 1.35 mg/dL — ABNORMAL HIGH (ref 0.44–1.00)
GFR calc Af Amer: 41 mL/min — ABNORMAL LOW (ref >60)
GFR calc non Af Amer: 36 mL/min — ABNORMAL LOW (ref >60)
GLUCOSE: 137 mg/dL — AB (ref 65–99)
Potassium: 3.7 mmol/L (ref 3.5–5.1)
SODIUM: 142 mmol/L (ref 135–145)
TOTAL PROTEIN: 7 g/dL (ref 6.5–8.1)
Total Bilirubin: 0.8 mg/dL (ref 0.3–1.2)

## 2016-09-05 LAB — ETHANOL: Alcohol, Ethyl (B): <5 mg/dL (ref <5)

## 2016-09-05 LAB — GLUCOSE, CAPILLARY
GLUCOSE-CAPILLARY: 112 mg/dL — AB (ref 65–99)
GLUCOSE-CAPILLARY: 126 mg/dL — AB (ref 65–99)
GLUCOSE-CAPILLARY: 166 mg/dL — AB (ref 65–99)
Glucose-Capillary: 88 mg/dL (ref 65–99)
Glucose-Capillary: 59 mg/dL — ABNORMAL LOW (ref 65–99)

## 2016-09-05 LAB — STREP PNEUMONIAE URINARY ANTIGEN: STREP PNEUMO URINARY ANTIGEN: NEGATIVE

## 2016-09-05 LAB — I-STAT TROPONIN, ED: TROPONIN I, POC: 0.01 ng/mL (ref 0.00–0.08)

## 2016-09-05 LAB — HIV ANTIBODY (ROUTINE TESTING W REFLEX): HIV Screen 4th Generation wRfx: NONREACTIVE

## 2016-09-05 LAB — I-STAT CG4 LACTIC ACID, ED: Lactic Acid, Venous: 1.64 mmol/L (ref 0.5–1.9)

## 2016-09-05 MED ORDER — IPRATROPIUM-ALBUTEROL 0.5-2.5 (3) MG/3ML IN SOLN
3.0000 mL | RESPIRATORY_TRACT | Status: DC | PRN
  Administered 2016-09-05 – 2016-09-06 (×2): 3 mL via RESPIRATORY_TRACT
  Filled 2016-09-05 (×2): qty 3

## 2016-09-05 MED ORDER — VANCOMYCIN HCL IN DEXTROSE 1-5 GM/200ML-% IV SOLN
1000.0000 mg | Freq: Once | INTRAVENOUS | Status: DC
  Filled 2016-09-05: qty 200

## 2016-09-05 MED ORDER — ACETAMINOPHEN 325 MG PO TABS
650.0000 mg | ORAL_TABLET | Freq: Four times a day (QID) | ORAL | Status: DC | PRN
Start: 2016-09-05 — End: 2016-09-09

## 2016-09-05 MED ORDER — SODIUM CHLORIDE 0.9 % IV BOLUS (SEPSIS)
1000.0000 mL | Freq: Once | INTRAVENOUS | Status: AC
  Administered 2016-09-05: 1000 mL via INTRAVENOUS

## 2016-09-05 MED ORDER — GUAIFENESIN ER 600 MG PO TB12: 600.0000 mg | ORAL_TABLET | Freq: Two times a day (BID) | ORAL | Status: DC

## 2016-09-05 MED ORDER — VANCOMYCIN HCL IN DEXTROSE 750-5 MG/150ML-% IV SOLN
750.0000 mg | INTRAVENOUS | Status: DC
  Administered 2016-09-06 (×2): 750 mg via INTRAVENOUS
  Filled 2016-09-05 (×2): qty 150

## 2016-09-05 MED ORDER — PIPERACILLIN-TAZOBACTAM 3.375 G IVPB 30 MIN
3.3750 g | Freq: Once | INTRAVENOUS | Status: DC
  Filled 2016-09-05: qty 50

## 2016-09-05 MED ORDER — SODIUM CHLORIDE 0.9 % IV SOLN
INTRAVENOUS | Status: DC
  Administered 2016-09-05 – 2016-09-06 (×2): via INTRAVENOUS
  Administered 2016-09-06: 1000 mL via INTRAVENOUS
  Administered 2016-09-07 – 2016-09-08 (×3): via INTRAVENOUS

## 2016-09-05 MED ORDER — ENOXAPARIN SODIUM 40 MG/0.4ML ~~LOC~~ SOLN
40.0000 mg | SUBCUTANEOUS | Status: DC
  Administered 2016-09-05 – 2016-09-09 (×5): 40 mg via SUBCUTANEOUS
  Filled 2016-09-05 (×5): qty 0.4

## 2016-09-05 MED ORDER — SODIUM CHLORIDE 0.9 % IV SOLN: INTRAVENOUS | Status: DC

## 2016-09-05 MED ORDER — AZITHROMYCIN 500 MG IV SOLR: 500.0000 mg | Freq: Every day | INTRAVENOUS | Status: DC

## 2016-09-05 MED ORDER — NALOXONE HCL 0.4 MG/ML IJ SOLN
INTRAMUSCULAR | Status: AC
  Administered 2016-09-05: 0.4 mg via INTRAVENOUS
  Filled 2016-09-05: qty 1

## 2016-09-05 MED ORDER — DONEPEZIL HCL 10 MG PO TABS: 10.0000 mg | ORAL_TABLET | Freq: Every day | ORAL | Status: DC

## 2016-09-05 MED ORDER — NALOXONE HCL 0.4 MG/ML IJ SOLN
0.4000 mg | Freq: Once | INTRAMUSCULAR | Status: AC
  Administered 2016-09-05: 0.4 mg via INTRAVENOUS

## 2016-09-05 MED ORDER — PIPERACILLIN-TAZOBACTAM 3.375 G IVPB
3.3750 g | Freq: Three times a day (TID) | INTRAVENOUS | Status: DC
  Administered 2016-09-05 – 2016-09-07 (×7): 3.375 g via INTRAVENOUS
  Filled 2016-09-05 (×8): qty 50

## 2016-09-05 MED ORDER — PREDNISONE 20 MG PO TABS: 40.0000 mg | ORAL_TABLET | Freq: Every day | ORAL | Status: DC

## 2016-09-05 MED ORDER — PIPERACILLIN-TAZOBACTAM 3.375 G IVPB 30 MIN
3.3750 g | Freq: Once | INTRAVENOUS | Status: AC
  Administered 2016-09-05: 3.375 g via INTRAVENOUS
  Filled 2016-09-05: qty 50

## 2016-09-05 MED ORDER — INSULIN ASPART 100 UNIT/ML ~~LOC~~ SOLN
0.0000 [IU] | Freq: Every day | SUBCUTANEOUS | Status: DC
  Administered 2016-09-07: 2 [IU] via SUBCUTANEOUS

## 2016-09-05 MED ORDER — GABAPENTIN 300 MG PO CAPS: 300.0000 mg | ORAL_CAPSULE | Freq: Three times a day (TID) | ORAL | Status: DC

## 2016-09-05 MED ORDER — MEMANTINE HCL ER 28 MG PO CP24
28.0000 mg | ORAL_CAPSULE | Freq: Every day | ORAL | Status: DC
  Filled 2016-09-05: qty 1

## 2016-09-05 MED ORDER — VANCOMYCIN HCL IN DEXTROSE 1-5 GM/200ML-% IV SOLN
1000.0000 mg | Freq: Once | INTRAVENOUS | Status: AC
  Administered 2016-09-05: 1000 mg via INTRAVENOUS
  Filled 2016-09-05: qty 200

## 2016-09-05 MED ORDER — ACETAMINOPHEN 650 MG RE SUPP
650.0000 mg | Freq: Once | RECTAL | Status: AC
  Administered 2016-09-05: 650 mg via RECTAL
  Filled 2016-09-05: qty 1

## 2016-09-05 MED ORDER — NALOXONE HCL 0.4 MG/ML IJ SOLN
0.4000 mg | Freq: Once | INTRAMUSCULAR | Status: AC
  Administered 2016-09-05: 0.4 mg via INTRAVENOUS
  Filled 2016-09-05: qty 1

## 2016-09-05 MED ORDER — NALOXONE HCL 0.4 MG/ML IJ SOLN: 0.4000 mg | INTRAMUSCULAR | Status: DC | PRN

## 2016-09-05 MED ORDER — DEXTROSE 5 % IV SOLN
1.0000 g | INTRAVENOUS | Status: DC
  Filled 2016-09-05: qty 10

## 2016-09-05 MED ORDER — AMLODIPINE BESYLATE 5 MG PO TABS: 5.0000 mg | ORAL_TABLET | Freq: Every day | ORAL | Status: DC

## 2016-09-05 MED ORDER — INSULIN ASPART 100 UNIT/ML ~~LOC~~ SOLN
0.0000 [IU] | Freq: Three times a day (TID) | SUBCUTANEOUS | Status: DC
  Administered 2016-09-06: 2 [IU] via SUBCUTANEOUS
  Administered 2016-09-07 (×3): 1 [IU] via SUBCUTANEOUS

## 2016-09-05 NOTE — Significant Event (Signed)
Rapid Response Event Note  Overview: Time Called: 0706 Arrival Time: 0715 Event Type: Neurologic  Initial Focused Assessment: Patient very sleepy, responsive to physical stimulation.  Lung sounds decreased bases. Upper airway sounds with rhonchi,  Thin clear sputum suctioned from back of mouth. Dr Posey Pronto at bedside to assess patient BP 98/56  SR 61  RR 16  O2 sat 100%  CGB 112  Interventions: 0.4 Narcan given, patient more responsive to voice able to answer orientation questions.  She is still very sleepy but will wake with stimulation. ABG done  7.3/48/105/25  0800  BP 73/60  SR 64  1L NS bolus given                                         NS @ 75cc/hr started  0830  BP 91/52,  Patient had brief conversation with family on phone.  0900 BP 107/53  Transported to MRI  With NS at 75cc/hr  1030 Bp 148/69  SR 74  Patient returned from MRI alert and oriented.  Dr Posey Pronto at bedside will cancel transfer.  Plan of Care (if not transferred): RN to call if patient becomes somnolent or hypotensive again Per RN VS and neuro checks q 4 hr  Event Summary: Name of Physician Notified: Dr Posey Pronto at [phone]    at    Outcome: Transferred (Comment)     Raliegh Ip

## 2016-09-05 NOTE — ED Notes (Signed)
Attempted report x1. 

## 2016-09-05 NOTE — Procedures (Signed)
History: 83 year old female with altered mental status  Sedation: None  Technique: This is a 21 channel routine scalp EEG performed at the bedside with bipolar and monopolar montages arranged in accordance to the international 10/20 system of electrode placement. One channel was dedicated to EKG recording.    Background: The background consists of intermixed alpha and beta activities. There is a well defined posterior dominant rhythm of 9 Hz that attenuates with eye opening. Sleep is recorded with normal appearing structures.   Photic stimulation: Physiologic driving is not performed  EEG Abnormalities: None  Clinical Interpretation: This normal EEG is recorded in the waking and sleep state. There was no seizure or seizure predisposition recorded on this study. Please note that a normal EEG does not preclude the possibility of epilepsy.   Roland Rack, MD [facility] [phone]  If 7pm- 7am, please page neurology on call as listed in [place].

## 2016-09-05 NOTE — Progress Notes (Signed)
EEG Completed; Results Pending  

## 2016-09-05 NOTE — Evaluation (Signed)
Clinical/Bedside Swallow Evaluation Patient Details  Name: TIYONNA SARDINHA MRN: [id number] Date of Birth: 1933/07/25  Today's Date: 09/05/2016 Time: SLP Start Time (ACUTE ONLY): 1430 SLP Stop Time (ACUTE ONLY): 1440 SLP Time Calculation (min) (ACUTE ONLY): 10 min  Past Medical History:  Past Medical History:  Diagnosis Date  . Asthma   . Diabetes mellitus    Past Surgical History: No past surgical history on file. HPI:  Pt is an 83 year old female admitted after taking a pill she was not supposed to take, resulting in lethargy, also found to have sepsis secondary to community acquired pna.    Assessment / Plan / Recommendation Clinical Impression  Pt demonstrates normal swallow function. Consumed 3 oz of water consecutively without signs of aspiration. May initiate a regular texture diet and thin liquids. No SLP f/u needed.  SLP Visit Diagnosis: Dysphagia, unspecified (R13.10)    Aspiration Risk  Mild aspiration risk    Diet Recommendation Regular;Thin liquid   Liquid Administration via: Cup;Straw Medication Administration: Whole meds with liquid Supervision: Patient able to self feed Postural Changes: Seated upright at 90 degrees    Other  Recommendations Oral Care Recommendations: Oral care BID   Follow up Recommendations None      Frequency and Duration            Prognosis        Swallow Study   General HPI: Pt is an 83 year old female admitted after taking a pill she was not supposed to take, resulting in lethargy, also found to have sepsis secondary to community acquired pna.  Type of Study: Bedside Swallow Evaluation Previous Swallow Assessment: none Diet Prior to this Study: NPO Temperature Spikes Noted: No Respiratory Status: Nasal cannula History of Recent Intubation: No Behavior/Cognition: Alert;Cooperative;Pleasant mood Oral Cavity Assessment: Within Functional Limits Oral Care Completed by SLP: No Oral Cavity - Dentition: Adequate natural  dentition Vision: Functional for self-feeding Self-Feeding Abilities: Able to feed self Patient Positioning: Upright in bed Baseline Vocal Quality: Normal Volitional Cough: Strong Volitional Swallow: Able to elicit    Oral/Motor/Sensory Function Overall Oral Motor/Sensory Function: Within functional limits   Ice Chips     Thin Liquid Thin Liquid: Within functional limits Presentation: Cup;Straw;Self Fed    Nectar Thick Nectar Thick Liquid: Not tested   Honey Thick Honey Thick Liquid: Not tested   Puree Puree: Within functional limits   Solid   GO   Solid: Within functional limits       [facility] MA CCC-SLP [phone]  Braeden Kennan, Katherene Ponto 09/05/2016,2:40 PM

## 2016-09-05 NOTE — Progress Notes (Signed)
[facility] PROGRESS NOTE  Patient: [name] LKG:[id number]   PCP: Seward Carol, MD DOB: 04/12/1933   DOA: 09/04/2016   DOS: 09/05/2016    Subjective: Rapid response was called on this patient in the morning. Patient arrived on the floor unresponsive and lethargic. Patient was hypotensive with blood pressure in 80s. On my arrival the patient was attended by rapid response nurse as well as patient's nurse. Initially the patient was unable to open her eyes, unable to follow any command. After receiving IV Narcan showed significant improvement and was alert and oriented 4.  Objective:  Vitals:   09/05/16 0900 09/05/16 1025  BP: (!) 107/53 (!) 148/69  Pulse: 64 74  Resp: 16 16  Temp:      General: Drowsy and lethargic, unresponsive and cannot follow any command. After receiving IV Narcan Alert, Awake and Oriented to Time, Place and Person. Appear in moderate distress, affect appropriate Eyes: PERRL, Conjunctiva normal ENT: Oral Mucosa clear moist. Neck: difficult to assess JVD, no Abnormal Mass Or lumps Cardiovascular: S1 and S2 Present, no Murmur, Peripheral Pulses Present Respiratory: normal respiratory effort, Bilateral Air entry equal and Decreased, no use of accessory muscle, Clear to Auscultation, no Crackles, no wheezes Abdomen: Bowel Sound present, Soft and no tenderness, no hernia Skin: no redness, no Rash, no induration Extremities: no Pedal edema, no calf tenderness Neurologic: Grossly no focal neuro deficit. Bilaterally Equal motor strength  Assessment and plan: 1) unresponsive episode. Still in the presence of taking unknown substance. Show significant improvement after receiving IV Narcan. IV fluid bolus was ordered. Patient was initially considered to be transferred to step down unit. Later on after close monitoring the patient showed significant improvement in mentation. Was able to pass a speech therapy evaluation for swallowing as well. MRI brain was ordered  stat, shows no acute abnormality. ABG was ordered, no hypoxemia, no hypercarbia. CBG was 112. All by mouth pills were discontinued. Continue IV hydration at present. Continue to monitor on telemetry.  The patient is critically ill with multiple organ systems failure and requires high complexity decision making for assessment and support, frequent evaluation and titration of therapies. Critical Care Time devoted to patient care services described in this note is 35 minutes   Author: Berle Mull, MD [facility] Pager: [phone] 09/05/2016 6:15 PM   If 7PM-7AM, please contact night-coverage at [email or web address], password [facility]

## 2016-09-05 NOTE — Progress Notes (Signed)
Pt arrived to 5w05. Placed on telemetry. Will continue to monitor pt. [name]

## 2016-09-05 NOTE — ED Notes (Signed)
Unable to obtain second set of cultures at this time. Initiating antibiotic administration.

## 2016-09-05 NOTE — Progress Notes (Signed)
Received report from [place], Therapist, sports

## 2016-09-05 NOTE — ED Notes (Signed)
Patient transported to CT via stretcher.

## 2016-09-05 NOTE — ED Notes (Signed)
Patient woke up more following Narcan administration. Pt states she "just feels tired."

## 2016-09-05 NOTE — H&P (Signed)
History and Physical    [name] NID:[id number] DOB: [birth year] DOA: 09/04/2016  Referring MD/NP/PA: Dr. Leonides Schanz PCP: Seward Carol, MD  Patient coming from: Home via ems  Chief Complaint: Confusion  HPI: [name] is a 83 y.o. female with medical history significant of asthma, DM type 2, SBO, s/p colostomy, dementia; who presents after EMS was called by the patient's granddaughter and she was noted to not be acting like her normal self. Patient had reportedly taking a pill out of her grandsons drawer was wrapped in a piece of paper. Per review of records it seems that the patient had complaints of patient having side pain and foul-smelling urine for the last 2 days. Patient gives history of having a nonproductive cough over the last week as well as not feeling well now that she is more alert with it.    ED Course: Upon admission into the emergency department patient was noted to be febrile up to 101.48F, heart rate 74-90, respirations up to 24, blood pressures maintained, and O2 saturations maintained on 4 L of nasal cannula oxygen. Patient was given Narcan due to symptoms. She is empirically started on antibiotics of vancomycin and Zosyn as necessary for sepsis. Chest x-ray showing signs of the left lower lobe densities suggestive of developing pneumonia. [facility] called to admit.  Review of Systems: As per HPI otherwise 10 point review of systems negative.   Past Medical History:  Diagnosis Date  . Asthma   . Diabetes mellitus     No past surgical history on file.   reports that she has never smoked. She has never used smokeless tobacco. She reports that she does not drink alcohol. Her drug history is not on file.  No Known Allergies  No family history on file.  Prior to Admission medications   Medication Sig Start Date End Date Taking? Authorizing Provider  albuterol (PROVENTIL HFA;VENTOLIN HFA) 108 (90 Base) MCG/ACT inhaler Inhale 1-2 puffs into the lungs every 6 (six) hours  as needed for wheezing or shortness of breath.    [provider]  alendronate (FOSAMAX) 70 MG tablet Take 70 mg by mouth once a week. Wednesday    [provider]  amLODipine (NORVASC) 5 MG tablet Take 1 tablet (5 mg total) by mouth daily. 04/12/12   Seward Carol, MD  donepezil (ARICEPT) 10 MG tablet Take 10 mg by mouth daily. 02/24/15   [provider]  gabapentin (NEURONTIN) 300 MG capsule Take 300 mg by mouth 3 (three) times daily. 10/12/15   [provider]  glipiZIDE (GLUCOTROL XL) 5 MG 24 hr tablet Take 5 mg by mouth daily.      [provider]  NAMENDA XR 28 MG CP24 24 hr capsule Take 28 mg by mouth daily. 02/27/15   [provider]  probenecid (BENEMID) 500 MG tablet Take 250 mg by mouth daily.     [provider]  VITAMIN A PO Take 1 tablet by mouth daily.    [provider]    Physical Exam:   Constitutional:Elderly female who appears sick, but alert and able to follow commands Vitals:   09/05/16 0200 09/05/16 0215 09/05/16 0230 09/05/16 0245  BP: 110/75  126/83 (!) 141/83  Pulse: 80 82 87 90  Resp: (!) 24 (!) 22 (!) 26 (!) 24  Temp:      TempSrc:      SpO2: 94% 95% 94% 92%  Weight:       Eyes: PERRL, lids and  conjunctivae normal ENMT: Mucous membranes are dry. Posterior pharynx clear of any exudate or lesions.Normal dentition.  Neck: normal, supple, no masses, no thyromegaly Respiratory: Tachypneic with expiratory wheezes appreciated and decreased overall aeration. Cardiovascular: Regular rate and rhythm, no murmurs / rubs / gallops. No extremity edema. 2+ pedal pulses. No carotid bruits.  Abdomen: no tenderness, no masses palpated. No hepatosplenomegaly. Bowel sounds positive.  Musculoskeletal: no clubbing / cyanosis. No joint deformity upper and lower extremities. Good ROM, no contractures. Normal muscle tone.  Skin: no rashes, lesions, ulcers. No induration Neurologic: CN 2-12 grossly intact.  Sensation intact, DTR normal. Strength 5/5 in all 4.  Psychiatric: Normal judgment and insight. Memory is of normal. Alert and oriented x 3. Normal mood.     Labs on Admission: I have personally reviewed following labs and imaging studies  CBC:  Recent Labs Lab 09/05/16 0057  WBC 14.0*  NEUTROABS 10.7*  HGB 12.9  HCT 40.8  MCV 97.8  PLT 782   Basic Metabolic Panel:  Recent Labs Lab 09/05/16 0057  NA 142  K 3.7  CL 104  CO2 26  GLUCOSE 137*  BUN 22*  CREATININE 1.35*  CALCIUM 9.2   GFR: Estimated Creatinine Clearance: 29.4 mL/min (A) (by C-G formula based on SCr of 1.35 mg/dL (H)). Liver Function Tests:  Recent Labs Lab 09/05/16 0057  AST 22  ALT 17  ALKPHOS 75  BILITOT 0.8  PROT 7.0  ALBUMIN 3.5   No results for input(s): LIPASE, AMYLASE in the last 168 hours. No results for input(s): AMMONIA in the last 168 hours. Coagulation Profile: No results for input(s): INR, PROTIME in the last 168 hours. Cardiac Enzymes: No results for input(s): CKTOTAL, CKMB, CKMBINDEX, TROPONINI in the last 168 hours. BNP (last 3 results) No results for input(s): PROBNP in the last 8760 hours. HbA1C: No results for input(s): HGBA1C in the last 72 hours. CBG: No results for input(s): GLUCAP in the last 168 hours. Lipid Profile: No results for input(s): CHOL, HDL, LDLCALC, TRIG, CHOLHDL, LDLDIRECT in the last 72 hours. Thyroid Function Tests: No results for input(s): TSH, T4TOTAL, FREET4, T3FREE, THYROIDAB in the last 72 hours. Anemia Panel: No results for input(s): VITAMINB12, FOLATE, FERRITIN, TIBC, IRON, RETICCTPCT in the last 72 hours. Urine analysis:    Component Value Date/Time   COLORURINE YELLOW 09/05/2016 0130   APPEARANCEUR HAZY (A) 09/05/2016 0130   LABSPEC 1.018 09/05/2016 0130   PHURINE 5.0 09/05/2016 0130   GLUCOSEU NEGATIVE 09/05/2016 0130   HGBUR SMALL (A) 09/05/2016 0130   BILIRUBINUR NEGATIVE 09/05/2016 0130   KETONESUR NEGATIVE 09/05/2016 0130    PROTEINUR NEGATIVE 09/05/2016 0130   UROBILINOGEN 1.0 01/09/2015 1620   NITRITE NEGATIVE 09/05/2016 0130   LEUKOCYTESUR NEGATIVE 09/05/2016 0130   Sepsis Labs: No results found for this or any previous visit (from the past 240 hour(s)).   Radiological Exams on Admission: Ct Head Wo Contrast  Result Date: 09/05/2016 CLINICAL DATA:  Altered mental status, pinpoint pupils. Suspected urinary tract infection. EXAM: CT HEAD WITHOUT CONTRAST TECHNIQUE: Contiguous axial images were obtained from the base of the skull through the vertex without intravenous contrast. COMPARISON:  MRI of the head January 09, 2015 FINDINGS: BRAIN: No intraparenchymal hemorrhage, mass effect nor midline shift. The ventricles and sulci are normal for age. Patchy supratentorial white matter hypodensities less than expected for patient's age, though non-specific are most compatible with chronic small vessel ischemic disease. No acute large vascular territory infarcts. No abnormal extra-axial fluid collections. Basal cisterns are  patent. Mild cerebellar tonsillar ectopia better characterized on prior MRI name VASCULAR: Moderate calcific atherosclerosis of the carotid siphons. SKULL: No skull fracture. Expanded empty sella. No significant scalp soft tissue swelling. SINUSES/ORBITS: Lobulated paranasal sinus mucosal thickening, status post FESS mastoid air cells are well aerated. Soft tissue within main external auditory canals compatible with cerumen. The included ocular globes and orbital contents are non-suspicious. OTHER: None. IMPRESSION: No acute intracranial process. Stable examination including empty sella and mild cerebellar tonsillar ectopia. Electronically Signed   By: Elon Alas M.D.   On: 09/05/2016 02:36   Dg Chest Port 1 View  Result Date: 09/05/2016 CLINICAL DATA:  83 year old female with altered mental status. EXAM: PORTABLE CHEST 1 VIEW COMPARISON:  Chest radiograph dated 04/11/2012 FINDINGS: Single portable  view of the chest demonstrates an area increased density in the left lower lung field concerning for developing infiltrate. Clinical correlation follow-up recommended. There is no pleural effusion or pneumothorax. The cardiac silhouette is within normal limits. The aorta is mildly tortuous. There is degenerative changes of the spine and shoulders. No acute osseous pathology. IMPRESSION: Left lung base density concerning for developing infiltrate. Clinical correlation follow-up recommended. Electronically Signed   By: Anner Crete M.D.   On: 09/05/2016 00:45    EKG: Independently reviewed. Sinus rhythm similar to previous EKGs  Assessment/Plan Sepsis secondary to community-acquired pneumonia - Admit to MedSurg bed - Follow up blood and sputum cultures - De-escalate antibiotics to Rocephin and azithromycin - Mucinex  Asthma exacerbation: Acute. Patient found to be wheezing on physical exam - Prednisone 40 mg - DuoNeb's prn SOB/Wheeze   Acute toxic metabolic encephalopathy: Resolving. Likely secondary to the patient taking a pill that she was not supposed to. UDS positive for barbiturates. - Neuro checks  Diabetes mellitus type 2 - Hypoglycemic protocol - Hold glipizide - CBGs every before meals and at bedtime with sensitive sliding scale insulin  Essential hypertension - Continue amlodipine  Dementia - Continue Aricept and Namenda  DVT prophylaxis: lovenox  Code Status:full  Family Communication:  No family present at bedside Disposition Plan: Likely discharge home once medically stable  Consults called: none Admission status: Inpatient   Norval Morton MD [facility] Pager [phone]  If 7PM-7AM, please contact night-coverage [email or web address] Password [facility]  09/05/2016, 3:59 AM

## 2016-09-05 NOTE — Progress Notes (Signed)
Notified Dr. Posey Pronto and rapid response RN at [phone] that pt arrived to unit at [phone] and that pt only alert to name with painful stimuli, and pt very sleepy. Pt's SBP is 92. Rapid response RN and Dr. Posey Pronto came to bedside to assess pt. Rapid response RN gave pt narcan. Dr. Posey Pronto gave order to transfer pt to stepdown unit. Report given to Georgina Peer, dayshift RN at bedside. Will continue to monitor pt. Ranelle Oyster, RN

## 2016-09-05 NOTE — Progress Notes (Signed)
Pharmacy Antibiotic Note  [name] is a 83 y.o. female admitted on 09/04/2016 with sepsis.  Pharmacy has been consulted for vancomycin and zosyn dosing.  -WBC 14, Tmax 101.61F -BPs soft -SCr 1.35 (baseline 0.87), CrCl 29.5 -CXR suggestive of PNA  Plan: -Vanc 750mg  Q24H -Zosyn 3.375g Q8H -F/u cultures, renal function, clinical status -VT at steady state  Weight: 149 lb 9.6 oz (67.9 kg)  Temp (24hrs), Avg:99.5 F (37.5 C), Min:97.7 F (36.5 C), Max:101.3 F (38.5 C)   Recent Labs Lab 09/05/16 0057 09/05/16 0118  WBC 14.0*  --   CREATININE 1.35*  --   LATICACIDVEN  --  1.64    Estimated Creatinine Clearance: 29.5 mL/min (A) (by C-G formula based on SCr of 1.35 mg/dL (H)).    No Known Allergies  Antimicrobials this admission: Vanc 6/23 >> Zosyn 6/23 >>  Dose adjustments this admission: N/A  Microbiology results: 6/23 BCx: sent 6/23 UCx: sent 6/23 Sputum: sent  Thank you for allowing pharmacy to be a part of this patient's care.  Myer Peer Grayland Ormond), PharmD  PGY1 Pharmacy Resident Pager: [phone] 09/05/2016 8:01 AM

## 2016-09-06 LAB — COMPREHENSIVE METABOLIC PANEL
ALBUMIN: 2.5 g/dL — AB (ref 3.5–5.0)
ALK PHOS: 51 U/L (ref 38–126)
ALT: 12 U/L — AB (ref 14–54)
AST: 15 U/L (ref 15–41)
Anion gap: 6 (ref 5–15)
BILIRUBIN TOTAL: 0.6 mg/dL (ref 0.3–1.2)
BUN: 13 mg/dL (ref 6–20)
CALCIUM: 7.6 mg/dL — AB (ref 8.9–10.3)
CO2: 25 mmol/L (ref 22–32)
CREATININE: 1 mg/dL (ref 0.44–1.00)
Chloride: 111 mmol/L (ref 101–111)
GFR calc Af Amer: 60 mL/min — ABNORMAL LOW (ref >60)
GFR calc non Af Amer: 51 mL/min — ABNORMAL LOW (ref >60)
GLUCOSE: 120 mg/dL — AB (ref 65–99)
Potassium: 3.4 mmol/L — ABNORMAL LOW (ref 3.5–5.1)
SODIUM: 142 mmol/L (ref 135–145)
TOTAL PROTEIN: 5.6 g/dL — AB (ref 6.5–8.1)

## 2016-09-06 LAB — CBC WITH DIFFERENTIAL/PLATELET
Basophils Absolute: 0 10*3/uL (ref 0.0–0.1)
Basophils Relative: 0 %
Eosinophils Absolute: 0.2 10*3/uL (ref 0.0–0.7)
Eosinophils Relative: 2 %
HCT: 34.1 % — ABNORMAL LOW (ref 36.0–46.0)
Hemoglobin: 10.7 g/dL — ABNORMAL LOW (ref 12.0–15.0)
Lymphocytes Relative: 19 %
Lymphs Abs: 1.8 10*3/uL (ref 0.7–4.0)
MCH: 31.2 pg (ref 26.0–34.0)
MCHC: 31.4 g/dL (ref 30.0–36.0)
MCV: 99.4 fL (ref 78.0–100.0)
Monocytes Absolute: 0.6 10*3/uL (ref 0.1–1.0)
Monocytes Relative: 7 %
Neutro Abs: 6.7 10*3/uL (ref 1.7–7.7)
Neutrophils Relative %: 72 %
Platelets: 151 10*3/uL (ref 150–400)
RBC: 3.43 MIL/uL — ABNORMAL LOW (ref 3.87–5.11)
RDW: 14.4 % (ref 11.5–15.5)
WBC: 9.3 10*3/uL (ref 4.0–10.5)

## 2016-09-06 LAB — PROTIME-INR
INR: 1.05
Prothrombin Time: 13.7 seconds (ref 11.4–15.2)

## 2016-09-06 LAB — GLUCOSE, CAPILLARY
GLUCOSE-CAPILLARY: 111 mg/dL — AB (ref 65–99)
Glucose-Capillary: 114 mg/dL — ABNORMAL HIGH (ref 65–99)
Glucose-Capillary: 155 mg/dL — ABNORMAL HIGH (ref 65–99)
Glucose-Capillary: 167 mg/dL — ABNORMAL HIGH (ref 65–99)
Glucose-Capillary: 149 mg/dL — ABNORMAL HIGH (ref 65–99)

## 2016-09-06 LAB — MAGNESIUM: Magnesium: 1.5 mg/dL — ABNORMAL LOW (ref 1.7–2.4)

## 2016-09-06 LAB — URINE CULTURE: CULTURE: NO GROWTH

## 2016-09-06 MED ORDER — HYDRALAZINE HCL 20 MG/ML IJ SOLN: 10.0000 mg | INTRAMUSCULAR | Status: DC | PRN

## 2016-09-06 MED ORDER — ALBUTEROL SULFATE (2.5 MG/3ML) 0.083% IN NEBU
2.5000 mg | INHALATION_SOLUTION | RESPIRATORY_TRACT | Status: DC | PRN
  Administered 2016-09-07 – 2016-09-09 (×2): 2.5 mg via RESPIRATORY_TRACT
  Filled 2016-09-06 (×3): qty 3

## 2016-09-06 MED ORDER — LORAZEPAM 1 MG PO TABS
1.0000 mg | ORAL_TABLET | Freq: Once | ORAL | Status: DC
  Filled 2016-09-06: qty 1

## 2016-09-06 MED ORDER — LORAZEPAM 2 MG/ML IJ SOLN
INTRAMUSCULAR | Status: AC
  Filled 2016-09-06: qty 1

## 2016-09-06 MED ORDER — ALBUTEROL SULFATE HFA 108 (90 BASE) MCG/ACT IN AERS: 1.0000 | INHALATION_SPRAY | RESPIRATORY_TRACT | Status: DC | PRN

## 2016-09-06 MED ORDER — MAGNESIUM SULFATE 2 GM/50ML IV SOLN
2.0000 g | Freq: Once | INTRAVENOUS | Status: AC
  Administered 2016-09-06: 2 g via INTRAVENOUS
  Filled 2016-09-06: qty 50

## 2016-09-06 MED ORDER — AMLODIPINE BESYLATE 2.5 MG PO TABS
2.5000 mg | ORAL_TABLET | Freq: Every day | ORAL | Status: DC
  Administered 2016-09-06 – 2016-09-07 (×2): 2.5 mg via ORAL
  Filled 2016-09-06 (×2): qty 1

## 2016-09-06 MED ORDER — PROBENECID 500 MG PO TABS
250.0000 mg | ORAL_TABLET | Freq: Every day | ORAL | Status: DC
  Administered 2016-09-06 – 2016-09-09 (×4): 250 mg via ORAL
  Filled 2016-09-06 (×4): qty 1

## 2016-09-06 MED ORDER — POTASSIUM CHLORIDE CRYS ER 20 MEQ PO TBCR
40.0000 meq | EXTENDED_RELEASE_TABLET | ORAL | Status: AC
  Administered 2016-09-06: 40 meq via ORAL
  Filled 2016-09-06: qty 2

## 2016-09-06 MED ORDER — MEMANTINE HCL ER 28 MG PO CP24
28.0000 mg | ORAL_CAPSULE | Freq: Every day | ORAL | Status: DC
  Administered 2016-09-06 – 2016-09-09 (×4): 28 mg via ORAL
  Filled 2016-09-06 (×4): qty 1

## 2016-09-06 NOTE — Progress Notes (Signed)
[facility] Progress Note  Patient: [name] HQP:[id number]   PCP: Seward Carol, MD DOB: June 05, 1933   DOA: 09/04/2016   DOS: 09/06/2016   Date of Service: the patient was seen and examined on 09/06/2016  Subjective: Feeling better. Does not remember a reason for admission. Denies having any acute complaint. Tolerating oral diet.  Brief hospital course: Pt. with PMH of asthma, type II DM, as below, colostomy, dementia; admitted on 09/04/2016, presented with complaint of confusion, was found to have likely unknown accidental drug overdose. Currently further plan is close monitoring.  Assessment and Plan: 1. Accidental unintentional drug overdose. Patient mentions that she was having some headache and has taken some Tylenol. Her urine is positive for barbiturate. Unclear what the patient took but she did present with a pinpoint pupils which was responding to Narcan. Currently does not have any encephalopathy. MRI brain negative. EEG negative. Neurological examination unremarkable. Continue close monitoring at present.  2. Dementia. Will resume home medication at present.  3. Type 2 diabetes mellitus. Currently oral hypoglycemic agents are on hold. Continue sensitive sliding scale.  4. History of asthma. Patient did have some wheezing on admission currently resolved. She was given prednisone. Continue DuoNeb's when necessary. No evidence of exacerbation.  5. Concern for sepsis due to community acquired pneumonia. Chest x-ray shows left basal infiltrate. Mild leukocytosis on admission. Blood culture negative. Urine culture currently pending. HIV negative. Continue IV antibiotics. Sepsis physiology is essentially resolved. Likely SIRS in the setting of drug overdose and not sepsis.  Diet: Cardiac diet DVT Prophylaxis: subcutaneous Heparin  Advance goals of care discussion: Full code  Family Communication: no family was present at bedside, at the time of interview.    Disposition:  Discharge to home.  Consultants: none Procedures: none  Antibiotics: Anti-infectives    Start     Dose/Rate Route Frequency Ordered Stop   09/05/16 2200  vancomycin (VANCOCIN) IVPB 750 mg/150 ml premix     750 mg 150 mL/hr over 60 Minutes Intravenous Every 24 hours 09/05/16 0758     09/05/16 0900  piperacillin-tazobactam (ZOSYN) IVPB 3.375 g  Status:  Discontinued     3.375 g 100 mL/hr over 30 Minutes Intravenous  Once 09/05/16 0740 09/05/16 0758   09/05/16 0830  vancomycin (VANCOCIN) IVPB 1000 mg/200 mL premix  Status:  Discontinued     1,000 mg 200 mL/hr over 60 Minutes Intravenous  Once 09/05/16 0740 09/05/16 0758   09/05/16 0800  cefTRIAXone (ROCEPHIN) 1 g in dextrose 5 % 50 mL IVPB  Status:  Discontinued     1 g 100 mL/hr over 30 Minutes Intravenous Every 24 hours 09/05/16 0555 09/05/16 0736   09/05/16 0800  piperacillin-tazobactam (ZOSYN) IVPB 3.375 g     3.375 g 12.5 mL/hr over 240 Minutes Intravenous Every 8 hours 09/05/16 0758     09/05/16 0630  azithromycin (ZITHROMAX) 500 mg in dextrose 5 % 250 mL IVPB  Status:  Discontinued     500 mg 250 mL/hr over 60 Minutes Intravenous Daily 09/05/16 0555 09/05/16 0736   09/05/16 0045  vancomycin (VANCOCIN) IVPB 1000 mg/200 mL premix     1,000 mg 200 mL/hr over 60 Minutes Intravenous  Once 09/05/16 0036 09/05/16 0541   09/05/16 0045  piperacillin-tazobactam (ZOSYN) IVPB 3.375 g     3.375 g 100 mL/hr over 30 Minutes Intravenous  Once 09/05/16 0036 09/05/16 0216       Objective: Physical Exam: Vitals:   09/05/16 2328 09/06/16 0552 09/06/16 1515 09/06/16  1518  BP: 130/78 (!) 111/50 (!) 162/83 (!) 172/82  Pulse: 74 68 66   Resp: 18 18 20    Temp: 100 F (37.8 C) 99.2 F (37.3 C) 98 F (36.7 C)   TempSrc:   Oral   SpO2: 100% 100% 98%   Weight:      Height:        Intake/Output Summary (Last 24 hours) at 09/06/16 1749 Last data filed at 09/06/16 0246  Gross per 24 hour  Intake                0 ml   Output              600 ml  Net             -600 ml   Filed Weights   09/05/16 0106 09/05/16 0659  Weight: 67 kg (147 lb 11.2 oz) 67.9 kg (149 lb 9.6 oz)   General: Alert, Awake and Oriented to Time, Place and Person. Appear in mild distress, affect appropriate Eyes: PERRL, Conjunctiva normal ENT: Oral Mucosa clear moist. Neck: no JVD, no Abnormal Mass Or lumps Cardiovascular: S1 and S2 Present, no Murmur, Peripheral Pulses Present Respiratory: normal respiratory effort, Bilateral Air entry equal and Decreased, no use of accessory muscle, Clear to Auscultation, no Crackles, no wheezes Abdomen: Bowel Sound present, Soft and no tenderness, no hernia Skin: no redness, no Rash, no induration Extremities: no Pedal edema, no calf tenderness Neurologic: Grossly no focal neuro deficit. Bilaterally Equal motor strength  Data Reviewed: CBC:  Recent Labs Lab 09/05/16 0057 09/06/16 0604  WBC 14.0* 9.3  NEUTROABS 10.7* 6.7  HGB 12.9 10.7*  HCT 40.8 34.1*  MCV 97.8 99.4  PLT 203 703   Basic Metabolic Panel:  Recent Labs Lab 09/05/16 0057 09/06/16 0604  NA 142 142  K 3.7 3.4*  CL 104 111  CO2 26 25  GLUCOSE 137* 120*  BUN 22* 13  CREATININE 1.35* 1.00  CALCIUM 9.2 7.6*  MG  --  1.5*    Liver Function Tests:  Recent Labs Lab 09/05/16 0057 09/06/16 0604  AST 22 15  ALT 17 12*  ALKPHOS 75 51  BILITOT 0.8 0.6  PROT 7.0 5.6*  ALBUMIN 3.5 2.5*   No results for input(s): LIPASE, AMYLASE in the last 168 hours. No results for input(s): AMMONIA in the last 168 hours. Coagulation Profile:  Recent Labs Lab 09/06/16 0604  INR 1.05   Cardiac Enzymes: No results for input(s): CKTOTAL, CKMB, CKMBINDEX, TROPONINI in the last 168 hours. BNP (last 3 results) No results for input(s): PROBNP in the last 8760 hours. CBG:  Recent Labs Lab 09/05/16 2325 09/06/16 0832 09/06/16 1213 09/06/16 1501 09/06/16 1712  GLUCAP 166* 114* 155* 111* 167*   Studies: No results  found.  Scheduled Meds: . amLODipine  2.5 mg Oral Daily  . enoxaparin (LOVENOX) injection  40 mg Subcutaneous Q24H  . insulin aspart  0-5 Units Subcutaneous QHS  . insulin aspart  0-9 Units Subcutaneous TID WC  . memantine  28 mg Oral Daily  . probenecid  250 mg Oral Daily   Continuous Infusions: . sodium chloride 1,000 mL (09/06/16 0626)  . piperacillin-tazobactam (ZOSYN)  IV 3.375 g (09/06/16 1506)  . vancomycin Stopped (09/06/16 0100)   PRN Meds: acetaminophen, albuterol, hydrALAZINE, ipratropium-albuterol  Time spent: 35 minutes  Author: Berle Mull, MD [facility] Pager: [phone] 09/06/2016 5:49 PM  If 7PM-7AM, please contact night-coverage at [email or web address], password [facility]

## 2016-09-07 LAB — BASIC METABOLIC PANEL
ANION GAP: 7 (ref 5–15)
BUN: 7 mg/dL (ref 6–20)
CALCIUM: 7.8 mg/dL — AB (ref 8.9–10.3)
CO2: 22 mmol/L (ref 22–32)
CREATININE: 0.79 mg/dL (ref 0.44–1.00)
Chloride: 112 mmol/L — ABNORMAL HIGH (ref 101–111)
GFR calc Af Amer: >60 mL/min (ref >60)
GFR calc non Af Amer: >60 mL/min (ref >60)
GLUCOSE: 149 mg/dL — AB (ref 65–99)
Potassium: 3.8 mmol/L (ref 3.5–5.1)
Sodium: 141 mmol/L (ref 135–145)

## 2016-09-07 LAB — LEGIONELLA PNEUMOPHILA SEROGP 1 UR AG: L. PNEUMOPHILA SEROGP 1 UR AG: NEGATIVE

## 2016-09-07 LAB — CBC
HCT: 34.5 % — ABNORMAL LOW (ref 36.0–46.0)
Hemoglobin: 11 g/dL — ABNORMAL LOW (ref 12.0–15.0)
MCH: 30.8 pg (ref 26.0–34.0)
MCHC: 31.9 g/dL (ref 30.0–36.0)
MCV: 96.6 fL (ref 78.0–100.0)
PLATELETS: 142 10*3/uL — AB (ref 150–400)
RBC: 3.57 MIL/uL — ABNORMAL LOW (ref 3.87–5.11)
RDW: 13.4 % (ref 11.5–15.5)
WBC: 7.9 10*3/uL (ref 4.0–10.5)

## 2016-09-07 LAB — GLUCOSE, CAPILLARY
Glucose-Capillary: 136 mg/dL — ABNORMAL HIGH (ref 65–99)
Glucose-Capillary: 121 mg/dL — ABNORMAL HIGH (ref 65–99)
Glucose-Capillary: 136 mg/dL — ABNORMAL HIGH (ref 65–99)
Glucose-Capillary: 226 mg/dL — ABNORMAL HIGH (ref 65–99)

## 2016-09-07 MED ORDER — AMOXICILLIN-POT CLAVULANATE 500-125 MG PO TABS
1.0000 | ORAL_TABLET | Freq: Three times a day (TID) | ORAL | Status: DC
  Administered 2016-09-07 – 2016-09-09 (×6): 500 mg via ORAL
  Filled 2016-09-07 (×8): qty 1

## 2016-09-07 MED ORDER — LORAZEPAM 2 MG/ML IJ SOLN
1.0000 mg | Freq: Once | INTRAMUSCULAR | Status: AC
  Administered 2016-09-07: 1 mg via INTRAVENOUS
  Filled 2016-09-07: qty 1

## 2016-09-07 MED ORDER — METHYLPREDNISOLONE SODIUM SUCC 125 MG IJ SOLR
60.0000 mg | Freq: Four times a day (QID) | INTRAMUSCULAR | Status: DC
  Administered 2016-09-07 – 2016-09-08 (×3): 60 mg via INTRAVENOUS
  Filled 2016-09-07 (×3): qty 2

## 2016-09-07 MED ORDER — MOMETASONE FURO-FORMOTEROL FUM 100-5 MCG/ACT IN AERO
2.0000 | INHALATION_SPRAY | Freq: Two times a day (BID) | RESPIRATORY_TRACT | Status: DC
  Administered 2016-09-08 – 2016-09-09 (×3): 2 via RESPIRATORY_TRACT
  Filled 2016-09-07: qty 8.8

## 2016-09-07 NOTE — Progress Notes (Signed)
Patient attempting to get out of bed multiple times, unable to be oriented or calmed by the RN, charge RN or NT. Patient confused, upset and combative. Family called to attempt to reorient and calm which was also unsuccessful. NP Scorr made aware, new orders received, will continue to monitor.

## 2016-09-07 NOTE — Progress Notes (Signed)
[facility] Progress Note  Patient: [name] GHW:[id number]   PCP: Seward Carol, MD DOB: [birth year]   DOA: 09/04/2016   DOS: 09/07/2016   Date of Service: the patient was seen and examined on 09/07/2016  Subjective: As more shortness of breath. No nausea no vomiting. Tolerating oral diet.  Brief hospital course: Pt. with PMH of asthma, type II DM, colostomy, dementia; admitted on 09/04/2016, presented with complaint of confusion, was found to have likely unknown accidental drug overdose. Currently further plan is treat asthma  Assessment and Plan: 1. Accidental unintentional drug overdose. Patient mentions that she was having some headache and has taken some Tylenol. Her urine is positive for barbiturate. Unclear what the patient took but she did present with a pinpoint pupils which was responding to Narcan. Currently does not have any encephalopathy. MRI brain negative. EEG negative. Neurological examination unremarkable. Continue close monitoring at present.  2. Dementia. resume home medication at present.  3. Type 2 diabetes mellitus. Currently oral hypoglycemic agents are on hold. Continue sensitive sliding scale.  4. Mild asthma exacerbation Community-acquired pneumonia. No evidence of sepsis. When necessary nebulizer IV Solu-Medrol Mucinex. Add dulera. Chest x-ray shows left basal infiltrate. Mild leukocytosis on admission. Blood culture negative. HIV negative. Sepsis physiology resolved, SIRS in the setting of drug overdose and not actual sepsis. Treat with Augmentin for pneumonia.  Diet: Cardiac diet DVT Prophylaxis: subcutaneous Heparin  Advance goals of care discussion: Full code  Family Communication: no family was present at bedside, at the time of interview.  Disposition:  Discharge to home.  Consultants: none Procedures: none  Antibiotics: Anti-infectives    Start     Dose/Rate Route Frequency Ordered Stop   09/05/16 2200  vancomycin  (VANCOCIN) IVPB 750 mg/150 ml premix  Status:  Discontinued     750 mg 150 mL/hr over 60 Minutes Intravenous Every 24 hours 09/05/16 0758 09/07/16 0729   09/05/16 0900  piperacillin-tazobactam (ZOSYN) IVPB 3.375 g  Status:  Discontinued     3.375 g 100 mL/hr over 30 Minutes Intravenous  Once 09/05/16 0740 09/05/16 0758   09/05/16 0830  vancomycin (VANCOCIN) IVPB 1000 mg/200 mL premix  Status:  Discontinued     1,000 mg 200 mL/hr over 60 Minutes Intravenous  Once 09/05/16 0740 09/05/16 0758   09/05/16 0800  cefTRIAXone (ROCEPHIN) 1 g in dextrose 5 % 50 mL IVPB  Status:  Discontinued     1 g 100 mL/hr over 30 Minutes Intravenous Every 24 hours 09/05/16 0555 09/05/16 0736   09/05/16 0800  piperacillin-tazobactam (ZOSYN) IVPB 3.375 g  Status:  Discontinued     3.375 g 12.5 mL/hr over 240 Minutes Intravenous Every 8 hours 09/05/16 0758 09/07/16 0729   09/05/16 0630  azithromycin (ZITHROMAX) 500 mg in dextrose 5 % 250 mL IVPB  Status:  Discontinued     500 mg 250 mL/hr over 60 Minutes Intravenous Daily 09/05/16 0555 09/05/16 0736   09/05/16 0045  vancomycin (VANCOCIN) IVPB 1000 mg/200 mL premix     1,000 mg 200 mL/hr over 60 Minutes Intravenous  Once 09/05/16 0036 09/05/16 0541   09/05/16 0045  piperacillin-tazobactam (ZOSYN) IVPB 3.375 g     3.375 g 100 mL/hr over 30 Minutes Intravenous  Once 09/05/16 0036 09/05/16 0216       Objective: Physical Exam: Vitals:   09/06/16 1518 09/06/16 2137 09/07/16 0650 09/07/16 1425  BP: (!) 172/82 (!) 183/81 (!) 149/85 (!) 164/74  Pulse:  77 74 64  Resp:  18 18  18  Temp:  98.9 F (37.2 C) 98.2 F (36.8 C) 98.6 F (37 C)  TempSrc:  Oral Oral   SpO2:  100% 99% 100%  Weight:      Height:        Intake/Output Summary (Last 24 hours) at 09/07/16 1702 Last data filed at 09/07/16 1422  Gross per 24 hour  Intake          2271.25 ml  Output              400 ml  Net          1871.25 ml   Filed Weights   09/05/16 0106 09/05/16 0659  Weight: 67  kg (147 lb 11.2 oz) 67.9 kg (149 lb 9.6 oz)   General: Alert, Awake and Oriented to Time, Place and Person. Appear in mild distress, affect appropriate Eyes: PERRL, Conjunctiva normal ENT: Oral Mucosa clear moist. Neck: no JVD, no Abnormal Mass Or lumps Cardiovascular: S1 and S2 Present, no Murmur, Peripheral Pulses Present Respiratory: normal respiratory effort, Bilateral Air entry equal and Decreased, no use of accessory muscle no Crackles, bilateral expiratory wheezes Abdomen: Bowel Sound present, Soft and no tenderness, no hernia Skin: no redness, no Rash, no induration Extremities: no Pedal edema, no calf tenderness Neurologic: Grossly no focal neuro deficit. Bilaterally Equal motor strength  Data Reviewed: CBC:  Recent Labs Lab 09/05/16 0057 09/06/16 0604 09/07/16 0526  WBC 14.0* 9.3 7.9  NEUTROABS 10.7* 6.7  --   HGB 12.9 10.7* 11.0*  HCT 40.8 34.1* 34.5*  MCV 97.8 99.4 96.6  PLT 203 151 694*   Basic Metabolic Panel:  Recent Labs Lab 09/05/16 0057 09/06/16 0604 09/07/16 0526  NA 142 142 141  K 3.7 3.4* 3.8  CL 104 111 112*  CO2 26 25 22   GLUCOSE 137* 120* 149*  BUN 22* 13 7  CREATININE 1.35* 1.00 0.79  CALCIUM 9.2 7.6* 7.8*  MG  --  1.5*  --     Liver Function Tests:  Recent Labs Lab 09/05/16 0057 09/06/16 0604  AST 22 15  ALT 17 12*  ALKPHOS 75 51  BILITOT 0.8 0.6  PROT 7.0 5.6*  ALBUMIN 3.5 2.5*   No results for input(s): LIPASE, AMYLASE in the last 168 hours. No results for input(s): AMMONIA in the last 168 hours. Coagulation Profile:  Recent Labs Lab 09/06/16 0604  INR 1.05   Cardiac Enzymes: No results for input(s): CKTOTAL, CKMB, CKMBINDEX, TROPONINI in the last 168 hours. BNP (last 3 results) No results for input(s): PROBNP in the last 8760 hours. CBG:  Recent Labs Lab 09/06/16 1501 09/06/16 1712 09/06/16 2136 09/07/16 0751 09/07/16 1233  GLUCAP 111* 167* 149* 136* 121*   Studies: No results found.  Scheduled Meds: .  amLODipine  2.5 mg Oral Daily  . enoxaparin (LOVENOX) injection  40 mg Subcutaneous Q24H  . insulin aspart  0-5 Units Subcutaneous QHS  . insulin aspart  0-9 Units Subcutaneous TID WC  . memantine  28 mg Oral Daily  . methylPREDNISolone (SOLU-MEDROL) injection  60 mg Intravenous Q6H  . probenecid  250 mg Oral Daily   Continuous Infusions: . sodium chloride 125 mL/hr at 09/07/16 0410   PRN Meds: acetaminophen, albuterol, hydrALAZINE, ipratropium-albuterol  Time spent: 35 minutes  Author: Berle Mull, MD [facility] Pager: [phone] 09/07/2016 5:02 PM  If 7PM-7AM, please contact night-coverage at [email or web address], password [facility]

## 2016-09-07 NOTE — Evaluation (Signed)
Occupational Therapy Evaluation Patient Details Name: [name] MRN: [id number] DOB: 1934-01-13 Today's Date: 09/07/2016    History of Present Illness 83 year old female admitted after taking a pill she was not supposed to take, resulting in lethargy, also found to have sepsis secondary to community acquired pna   Clinical Impression   Pt is a poor historian, unable to offer information about PLOF or home set up. Pt presents with significant impairment in cognition, only oriented to self. Pt without awareness of urinary incontinence in bed. Pt requires min to total assist for self care this visit and moderate assistance for ambulation. Recommending SNF upon discharge. Will follow acutely.    Follow Up Recommendations  SNF;Supervision/Assistance - 24 hour    Equipment Recommendations       Recommendations for Other Services       Precautions / Restrictions Precautions Precautions: Fall Restrictions Weight Bearing Restrictions: No      Mobility Bed Mobility Overal bed mobility: Needs Assistance Bed Mobility: Supine to Sit     Supine to sit: Min assist     General bed mobility comments: min assist to pull to sitting, Max cues for task performance  Transfers Overall transfer level: Needs assistance Equipment used: 2 person hand held assist Transfers: Sit to/from [facility] Sit to Stand: Mod assist Stand pivot transfers: Mod assist       General transfer comment: moderate assist to elevate to standing and take shuffle pivotal steps to [facility], incontinent of urine during transition    Balance Overall balance assessment: Needs assistance Sitting-balance support: Feet supported Sitting balance-Leahy Scale: Fair Sitting balance - Comments: some posterior lean noted Postural control: Posterior lean   Standing balance-Leahy Scale: Poor Standing balance comment: reliance on heavy assist to maintain upright, flexed position                            ADL either performed or assessed with clinical judgement   ADL Overall ADL's : Needs assistance/impaired Eating/Feeding: Set up;Sitting   Grooming: Wash/dry face;Sitting;Minimal assistance Grooming Details (indicate cue type and reason): poor thoroughness Upper Body Bathing: Maximal assistance;Sitting   Lower Body Bathing: Total assistance;Sit to/from stand   Upper Body Dressing : Sitting;Maximal assistance   Lower Body Dressing: Total assistance;Sit to/from stand   Toilet Transfer: Moderate assistance;Stand-pivot;BSC Toilet Transfer Details (indicate cue type and reason): pt with urinary incontinence in route Toileting- Clothing Manipulation and Hygiene: Total assistance;Sit to/from stand       Functional mobility during ADLs: Moderate assistance General ADL Comments: Pt with urinary incontinence in bed without awareness.     Vision   Vision Assessment?: No apparent visual deficits     Perception     Praxis      Pertinent Vitals/Pain Pain Assessment: No/denies pain     Hand Dominance Right   Extremity/Trunk Assessment Upper Extremity Assessment Upper Extremity Assessment: Generalized weakness   Lower Extremity Assessment Lower Extremity Assessment: Generalized weakness       Communication Communication Communication: HOH   Cognition Arousal/Alertness: Awake/alert Behavior During Therapy: Flat affect Overall Cognitive Status: Impaired/Different from baseline Area of Impairment: Orientation;Attention;Following commands;Safety/judgement;Awareness;Problem solving                 Orientation Level: Disoriented to;Place;Time;Situation Current Attention Level: Sustained   Following Commands: Follows one step commands with increased time Safety/Judgement: Decreased awareness of safety;Decreased awareness of deficits Awareness: Intellectual Problem Solving: Slow processing;Decreased initiation;Difficulty sequencing;Requires verbal cues;Requires  tactile cues General Comments: patient confused throughout session, believes that she is at home and in the act of cooking   General Comments       Exercises     Shoulder Instructions      Home Living Family/patient expects to be discharged to:: Private residence Living Arrangements: Other relatives Available Help at Discharge: Family Type of Home: House                           Additional Comments: patient is poor historian, confused throughout session, believes she is at home cooking a meal      Prior Functioning/Environment                   OT Problem List: Decreased strength;Decreased activity tolerance;Impaired balance (sitting and/or standing);Decreased cognition;Decreased knowledge of use of DME or AE;Decreased safety awareness      OT Treatment/Interventions: Self-care/ADL training;DME and/or AE instruction;Patient/family education;Balance training;Cognitive remediation/compensation;Therapeutic activities    OT Goals(Current goals can be found in the care plan section) Acute Rehab OT Goals Patient Stated Goal: none stated OT Goal Formulation: Patient unable to participate in goal setting Time For Goal Achievement: 09/21/16 Potential to Achieve Goals: Good ADL Goals Pt Will Perform Grooming: with supervision;standing Pt Will Perform Upper Body Dressing: with supervision;sitting Pt Will Perform Lower Body Dressing: with supervision;sit to/from stand Pt Will Transfer to Toilet: with supervision;ambulating;bedside commode Pt Will Perform Toileting - Clothing Manipulation and hygiene: with supervision;sit to/from stand  OT Frequency: Min 2X/week   Barriers to D/C:            Co-evaluation PT/OT/SLP Co-Evaluation/Treatment: Yes Reason for Co-Treatment: For patient/therapist safety;Necessary to address cognition/behavior during functional activity PT goals addressed during session: Mobility/safety with mobility OT goals addressed during session:  ADL's and self-care      AM-PAC PT "6 Clicks" Daily Activity     Outcome Measure Help from another person eating meals?: A Little Help from another person taking care of personal grooming?: A Lot Help from another person toileting, which includes using toliet, bedpan, or urinal?: Total Help from another person bathing (including washing, rinsing, drying)?: A Lot Help from another person to put on and taking off regular upper body clothing?: A Lot Help from another person to put on and taking off regular lower body clothing?: Total 6 Click Score: 11   End of Session Equipment Utilized During Treatment: Gait belt  Activity Tolerance: Patient tolerated treatment well Patient left: in chair;with call bell/phone within reach;with chair alarm set  OT Visit Diagnosis: Unsteadiness on feet (R26.81);Muscle weakness (generalized) (M62.81);Other symptoms and signs involving cognitive function                Time: 1610-9604 OT Time Calculation (min): 25 min Charges:  OT General Charges $OT Visit: 1 Procedure OT Evaluation $OT Eval Moderate Complexity: 1 Procedure G-Codes:     Malka So 09/07/2016, 9:21 AM  [phone]

## 2016-09-07 NOTE — Progress Notes (Signed)
CSW received consult regarding PT recommendation of SNF at discharge.  [name] is refusing SNF. She is very involved in patient's care and is at home with her to supervise as well as her husband. She is afraid that patient will be more disoriented if she is away from home. She has had home health before and would like that again along with some equipment. RNCM alerted.  CSW signing off.   Percell Locus Ileah Falkenstein LCSWA [phone]

## 2016-09-07 NOTE — Evaluation (Signed)
Physical Therapy Evaluation Patient Details Name: [name] MRN: [id number] DOB: [birth year] Today's Date: 09/07/2016   History of Present Illness  83 year old female admitted after taking a pill she was not supposed to take, resulting in lethargy, also found to have sepsis secondary to community acquired pna  Clinical Impression  Orders received for PT evaluation. Patient demonstrates deficits in functional mobility as indicated below. Will benefit from continued skilled PT to address deficits and maximize function. Will see as indicated and progress as tolerated.      Follow Up Recommendations SNF;Supervision/Assistance - 24 hour    Equipment Recommendations  Rolling walker with 5" wheels    Recommendations for Other Services       Precautions / Restrictions Precautions Precautions: Fall Restrictions Weight Bearing Restrictions: No      Mobility  Bed Mobility Overal bed mobility: Needs Assistance Bed Mobility: Supine to Sit     Supine to sit: Min assist     General bed mobility comments: min assist to pull to sitting, Max cues for task performance  Transfers Overall transfer level: Needs assistance Equipment used: 2 person hand held assist Transfers: Sit to/from [facility] Sit to Stand: Mod assist Stand pivot transfers: Mod assist       General transfer comment: moderate assist to elevate to standing and take shuffle pivotal steps to [facility], incontinent of urine during transition  Ambulation/Gait Ambulation/Gait assistance: Mod assist;+2 physical assistance Ambulation Distance (Feet): 12 Feet Assistive device: 2 person hand held assist Gait Pattern/deviations: Step-to pattern;Decreased stride length;Shuffle;Trunk flexed;Staggering right;Staggering left Gait velocity: decreased Gait velocity interpretation: Below normal speed for age/gender General Gait Details: patient reaching out for use of bed and 2nd person assist, poor stability min to  moderate assist 2 persons to reach chair safely  Stairs            Wheelchair Mobility    Modified Rankin (Stroke Patients Only)       Balance Overall balance assessment: Needs assistance Sitting-balance support: Feet supported Sitting balance-Leahy Scale: Fair Sitting balance - Comments: some posterior lean noted Postural control: Posterior lean   Standing balance-Leahy Scale: Poor Standing balance comment: reliance on heavy assist to maintain upright, flexed position                             Pertinent Vitals/Pain Pain Assessment: No/denies pain    Home Living Family/patient expects to be discharged to:: Private residence Living Arrangements: Other relatives Available Help at Discharge: Family Type of Home: House           Additional Comments: patient is poor historian, confused throughout session, believes she is at home cooking a meal    Prior Function                 Hand Dominance        Extremity/Trunk Assessment   Upper Extremity Assessment Upper Extremity Assessment: Generalized weakness    Lower Extremity Assessment Lower Extremity Assessment: Generalized weakness       Communication   Communication: HOH  Cognition Arousal/Alertness: Awake/alert Behavior During Therapy: Flat affect Overall Cognitive Status: Impaired/Different from baseline Area of Impairment: Orientation;Attention;Following commands;Safety/judgement;Awareness;Problem solving                 Orientation Level: Disoriented to;Place;Time;Situation Current Attention Level: Sustained   Following Commands: Follows one step commands with increased time Safety/Judgement: Decreased awareness of safety;Decreased awareness of deficits Awareness: Intellectual Problem Solving: Slow  processing;Decreased initiation;Difficulty sequencing;Requires verbal cues;Requires tactile cues General Comments: patient confused throughout session, believes that she is  at home and in the act of cooking      General Comments      Exercises     Assessment/Plan    PT Assessment Patient needs continued PT services  PT Problem List Decreased strength;Decreased activity tolerance;Decreased balance;Decreased mobility;Decreased cognition;Decreased safety awareness;Cardiopulmonary status limiting activity       PT Treatment Interventions DME instruction;Gait training;Therapeutic activities;Therapeutic exercise;Functional mobility training;Balance training;Patient/family education;Cognitive remediation    PT Goals (Current goals can be found in the Care Plan section)  Acute Rehab PT Goals Patient Stated Goal: none stated PT Goal Formulation: With patient Time For Goal Achievement: 09/21/16 Potential to Achieve Goals: Good    Frequency Min 2X/week   Barriers to discharge        Co-evaluation PT/OT/SLP Co-Evaluation/Treatment: Yes Reason for Co-Treatment: Complexity of the patient's impairments (multi-system involvement);Necessary to address cognition/behavior during functional activity;For patient/therapist safety PT goals addressed during session: Mobility/safety with mobility         AM-PAC PT "6 Clicks" Daily Activity  Outcome Measure Difficulty turning over in bed (including adjusting bedclothes, sheets and blankets)?: Total Difficulty moving from lying on back to sitting on the side of the bed? : Total Difficulty sitting down on and standing up from a chair with arms (e.g., wheelchair, bedside commode, etc,.)?: Total Help needed moving to and from a bed to chair (including a wheelchair)?: A Little Help needed walking in hospital room?: A Lot Help needed climbing 3-5 steps with a railing? : A Lot 6 Click Score: 10    End of Session Equipment Utilized During Treatment: Gait belt;Oxygen Activity Tolerance: Patient tolerated treatment well;Patient limited by fatigue Patient left: in chair;with call bell/phone within reach;with chair alarm  set Nurse Communication: Mobility status PT Visit Diagnosis: Unsteadiness on feet (R26.81);Muscle weakness (generalized) (M62.81)    Time: 6060-0459 PT Time Calculation (min) (ACUTE ONLY): 25 min   Charges:   PT Evaluation $PT Eval Moderate Complexity: 1 Procedure     PT G Codes:        Alben Deeds, PT DPT  [phone]   Duncan Dull 09/07/2016, 9:10 AM

## 2016-09-08 LAB — GLUCOSE, CAPILLARY
Glucose-Capillary: 166 mg/dL — ABNORMAL HIGH (ref 65–99)
Glucose-Capillary: 251 mg/dL — ABNORMAL HIGH (ref 65–99)
Glucose-Capillary: 225 mg/dL — ABNORMAL HIGH (ref 65–99)
Glucose-Capillary: 138 mg/dL — ABNORMAL HIGH (ref 65–99)

## 2016-09-08 LAB — BASIC METABOLIC PANEL
Anion gap: 7 (ref 5–15)
BUN: 5 mg/dL — AB (ref 6–20)
CO2: 21 mmol/L — ABNORMAL LOW (ref 22–32)
Calcium: 8.1 mg/dL — ABNORMAL LOW (ref 8.9–10.3)
Chloride: 111 mmol/L (ref 101–111)
Creatinine, Ser: 0.72 mg/dL (ref 0.44–1.00)
GFR calc Af Amer: >60 mL/min (ref >60)
GLUCOSE: 180 mg/dL — AB (ref 65–99)
POTASSIUM: 4.1 mmol/L (ref 3.5–5.1)
Sodium: 139 mmol/L (ref 135–145)

## 2016-09-08 LAB — CBC
HCT: 35 % — ABNORMAL LOW (ref 36.0–46.0)
Hemoglobin: 11.5 g/dL — ABNORMAL LOW (ref 12.0–15.0)
MCH: 30.8 pg (ref 26.0–34.0)
MCHC: 32.9 g/dL (ref 30.0–36.0)
MCV: 93.8 fL (ref 78.0–100.0)
PLATELETS: 199 10*3/uL (ref 150–400)
RBC: 3.73 MIL/uL — ABNORMAL LOW (ref 3.87–5.11)
RDW: 12.9 % (ref 11.5–15.5)
WBC: 4.9 10*3/uL (ref 4.0–10.5)

## 2016-09-08 MED ORDER — INSULIN ASPART 100 UNIT/ML ~~LOC~~ SOLN: 0.0000 [IU] | Freq: Every day | SUBCUTANEOUS | Status: DC

## 2016-09-08 MED ORDER — BACITRACIN-NEOMYCIN-POLYMYXIN OINTMENT TUBE
TOPICAL_OINTMENT | Freq: Two times a day (BID) | CUTANEOUS | Status: DC
  Administered 2016-09-08 – 2016-09-09 (×3): via TOPICAL
  Filled 2016-09-08: qty 14.17

## 2016-09-08 MED ORDER — QUETIAPINE FUMARATE 25 MG PO TABS
25.0000 mg | ORAL_TABLET | Freq: Every day | ORAL | Status: DC
  Administered 2016-09-08: 25 mg via ORAL
  Filled 2016-09-08: qty 1

## 2016-09-08 MED ORDER — AMLODIPINE BESYLATE 5 MG PO TABS
5.0000 mg | ORAL_TABLET | Freq: Every day | ORAL | Status: DC
  Administered 2016-09-08 – 2016-09-09 (×2): 5 mg via ORAL
  Filled 2016-09-08 (×2): qty 1

## 2016-09-08 MED ORDER — PREDNISONE 50 MG PO TABS
50.0000 mg | ORAL_TABLET | Freq: Every day | ORAL | Status: DC
  Administered 2016-09-08 – 2016-09-09 (×2): 50 mg via ORAL
  Filled 2016-09-08 (×3): qty 1

## 2016-09-08 MED ORDER — FUROSEMIDE 20 MG PO TABS
20.0000 mg | ORAL_TABLET | Freq: Every day | ORAL | Status: DC
  Administered 2016-09-08: 20 mg via ORAL
  Filled 2016-09-08: qty 1

## 2016-09-08 MED ORDER — INSULIN ASPART 100 UNIT/ML ~~LOC~~ SOLN
0.0000 [IU] | Freq: Three times a day (TID) | SUBCUTANEOUS | Status: DC
  Administered 2016-09-08: 3 [IU] via SUBCUTANEOUS
  Administered 2016-09-08: 8 [IU] via SUBCUTANEOUS
  Administered 2016-09-08: 5 [IU] via SUBCUTANEOUS
  Administered 2016-09-09 (×2): 3 [IU] via SUBCUTANEOUS

## 2016-09-08 MED ORDER — FUROSEMIDE 10 MG/ML IJ SOLN
40.0000 mg | Freq: Every day | INTRAMUSCULAR | Status: DC
  Administered 2016-09-08: 40 mg via INTRAVENOUS
  Filled 2016-09-08 (×2): qty 4

## 2016-09-08 NOTE — Progress Notes (Signed)
Inpatient Diabetes Program Recommendations  AACE/ADA: New Consensus Statement on Inpatient Glycemic Control (2015)  Target Ranges:  Prepandial:   less than 140 mg/dL      Peak postprandial:   less than 180 mg/dL (1-2 hours)      Critically ill patients:  140 - 180 mg/dL   Results for [name], [name] (MRN [id number]) as of 09/08/2016 14:27  Ref. Range 09/08/2016 07:56 09/08/2016 12:29  Glucose-Capillary Latest Ref Range: 65 - 99 mg/dL 166 (H) 251 (H)    Admit with: AMS  History: DM  Home DM Meds: Glipizide 5 mg BID  Current Insulin Orders: Novolog Moderate Correction Scale/ SSI (0-15 units) TID AC + HS       MD- Note patient received last dose Solumedrol 5am today.  Now getting Prednisone 50 mg daily.  Having afternoon glucose elevations likely due to steroids.  Please consider starting Novolog Meal Coverage: Novolog 3 units TID with meals (hold if pt eats <50% of meal)     --Will follow patient during hospitalization--  Wyn Quaker RN, MSN, CDE Diabetes Coordinator Inpatient Glycemic Control Team Team Pager: [phone] (8a-5p)

## 2016-09-08 NOTE — Progress Notes (Addendum)
Patient more confused and disoriented than in AM. She keep asking for her clothes, why she is still here and is not going to work. MD notified. Will continue to monitor.

## 2016-09-08 NOTE — Progress Notes (Signed)
[facility] Progress Note  Patient: [name] ZDG:[id number]   PCP: Seward Carol, MD DOB: 09-Oct-1933   DOA: 09/04/2016   DOS: 09/08/2016   Date of Service: the patient was seen and examined on 09/08/2016  Subjective: Shortness of breath is somewhat better. No chest pain and abdominal pain. Audible wheezing is gone. Somewhat more confused at the evening time.  Brief hospital course: Pt. with PMH of asthma, type II DM, colostomy, dementia; admitted on 09/04/2016, presented with complaint of confusion, was found to have likely unknown accidental drug overdose. Currently further plan is treat asthma  Assessment and Plan: 1. Accidental unintentional drug overdose. Acute encephalopathy. Patient mentions that she was having some headache and has taken some Tylenol. Her urine is positive for barbiturate. she did present with a pinpoint pupils which was responding to Narcan. The next day granddaughter brought Darvocet, which the patient may have taken according to her granddaughter. Currently has only sundowning. MRI brain negative. EEG negative. Neurological examination unremarkable. Continue close monitoring at present. Recommended granddaughter to flush Darvocet tablets.  2. Dementia. resume home medication at present. Seroquel at night for delirium.  3. Type 2 diabetes mellitus. Currently oral hypoglycemic agents are on hold. Continue sliding scale.  4. asthma exacerbation Community-acquired pneumonia. Questionable aspiration. No evidence of sepsis. When necessary nebulizer IV Solu-Medrol Mucinex. Add dulera. Chest x-ray shows left basal infiltrate. Mild leukocytosis on admission. Blood culture negative. HIV negative. Sepsis physiology resolved, SIRS in the setting of drug overdose and not actual sepsis. Treat with Augmentin for pneumonia.  5. Acute on Chronic diastolic CHF. Essential hypertension, with acceleration Sinus bradycardia Patient on Lasix at home on a  scheduled basis. Suspecting she has diastolic dysfunction. Patient is on losartan and Lasix at home. Lasix was initially on hold with suspicion for sepsis. Now patient appears more volume overloaded. I will give IV Lasix 1, continue daily monitoring. We will add amlodipine. Holding losartan as the patient presented with acute kidney injury and currently receiving IV Lasix.  Diet: Cardiac diet DVT Prophylaxis: subcutaneous Heparin  Advance goals of care discussion: Full code  Family Communication: no family was present at bedside, at the time of interview.  Disposition:  Discharge to home., PT recommends SNF family refuses.  Consultants: none Procedures: none  Antibiotics: Anti-infectives    Start     Dose/Rate Route Frequency Ordered Stop   09/07/16 1730  amoxicillin-clavulanate (AUGMENTIN) 500-125 MG per tablet 500 mg     1 tablet Oral 3 times daily 09/07/16 1705     09/05/16 2200  vancomycin (VANCOCIN) IVPB 750 mg/150 ml premix  Status:  Discontinued     750 mg 150 mL/hr over 60 Minutes Intravenous Every 24 hours 09/05/16 0758 09/07/16 0729   09/05/16 0900  piperacillin-tazobactam (ZOSYN) IVPB 3.375 g  Status:  Discontinued     3.375 g 100 mL/hr over 30 Minutes Intravenous  Once 09/05/16 0740 09/05/16 0758   09/05/16 0830  vancomycin (VANCOCIN) IVPB 1000 mg/200 mL premix  Status:  Discontinued     1,000 mg 200 mL/hr over 60 Minutes Intravenous  Once 09/05/16 0740 09/05/16 0758   09/05/16 0800  cefTRIAXone (ROCEPHIN) 1 g in dextrose 5 % 50 mL IVPB  Status:  Discontinued     1 g 100 mL/hr over 30 Minutes Intravenous Every 24 hours 09/05/16 0555 09/05/16 0736   09/05/16 0800  piperacillin-tazobactam (ZOSYN) IVPB 3.375 g  Status:  Discontinued     3.375 g 12.5 mL/hr over 240 Minutes Intravenous Every  8 hours 09/05/16 0758 09/07/16 0729   09/05/16 0630  azithromycin (ZITHROMAX) 500 mg in dextrose 5 % 250 mL IVPB  Status:  Discontinued     500 mg 250 mL/hr over 60 Minutes  Intravenous Daily 09/05/16 0555 09/05/16 0736   09/05/16 0045  vancomycin (VANCOCIN) IVPB 1000 mg/200 mL premix     1,000 mg 200 mL/hr over 60 Minutes Intravenous  Once 09/05/16 0036 09/05/16 0541   09/05/16 0045  piperacillin-tazobactam (ZOSYN) IVPB 3.375 g     3.375 g 100 mL/hr over 30 Minutes Intravenous  Once 09/05/16 0036 09/05/16 0216       Objective: Physical Exam: Vitals:   09/08/16 0453 09/08/16 0454 09/08/16 0853 09/08/16 1444  BP:  (!) 164/79  (!) 149/77  Pulse:  65 67 82  Resp: 15  16 18   Temp: 98.5 F (36.9 C)   97.8 F (36.6 C)  TempSrc: Oral     SpO2:  99% 99% 100%  Weight:      Height:        Intake/Output Summary (Last 24 hours) at 09/08/16 1929 Last data filed at 09/08/16 1335  Gross per 24 hour  Intake              240 ml  Output                0 ml  Net              240 ml   Filed Weights   09/05/16 0106 09/05/16 0659  Weight: 67 kg (147 lb 11.2 oz) 67.9 kg (149 lb 9.6 oz)   General: Alert, Awake and Oriented to Time, Place and Person. Appear in mild distress, affect appropriate Eyes: PERRL, Conjunctiva normal ENT: Oral Mucosa clear moist. Neck: no JVD, no Abnormal Mass Or lumps Cardiovascular: S1 and S2 Present, no Murmur, Peripheral Pulses Present Respiratory: normal respiratory effort, Bilateral Air entry equal and Decreased, no use of accessory muscle , bilateral Crackles, bilateral expiratory wheezes Abdomen: Bowel Sound present, Soft and no tenderness, no hernia Skin: no redness, no Rash, no induration Extremities: , Bilateral Pedal edema, no calf tenderness Neurologic: Grossly no focal neuro deficit. Bilaterally Equal motor strength  Data Reviewed: CBC:  Recent Labs Lab 09/05/16 0057 09/06/16 0604 09/07/16 0526 09/08/16 0810  WBC 14.0* 9.3 7.9 4.9  NEUTROABS 10.7* 6.7  --   --   HGB 12.9 10.7* 11.0* 11.5*  HCT 40.8 34.1* 34.5* 35.0*  MCV 97.8 99.4 96.6 93.8  PLT 203 151 142* 626   Basic Metabolic Panel:  Recent Labs Lab  09/05/16 0057 09/06/16 0604 09/07/16 0526 09/08/16 0810  NA 142 142 141 139  K 3.7 3.4* 3.8 4.1  CL 104 111 112* 111  CO2 26 25 22  21*  GLUCOSE 137* 120* 149* 180*  BUN 22* 13 7 5*  CREATININE 1.35* 1.00 0.79 0.72  CALCIUM 9.2 7.6* 7.8* 8.1*  MG  --  1.5*  --   --     Liver Function Tests:  Recent Labs Lab 09/05/16 0057 09/06/16 0604  AST 22 15  ALT 17 12*  ALKPHOS 75 51  BILITOT 0.8 0.6  PROT 7.0 5.6*  ALBUMIN 3.5 2.5*   No results for input(s): LIPASE, AMYLASE in the last 168 hours. No results for input(s): AMMONIA in the last 168 hours. Coagulation Profile:  Recent Labs Lab 09/06/16 0604  INR 1.05   Cardiac Enzymes: No results for input(s): CKTOTAL, CKMB, CKMBINDEX, TROPONINI in the last 168  hours. BNP (last 3 results) No results for input(s): PROBNP in the last 8760 hours. CBG:  Recent Labs Lab 09/07/16 1733 09/07/16 2220 09/08/16 0756 09/08/16 1229 09/08/16 1708  GLUCAP 136* 226* 166* 251* 225*   Studies: No results found.  Scheduled Meds: . amLODipine  5 mg Oral Daily  . amoxicillin-clavulanate  1 tablet Oral TID  . enoxaparin (LOVENOX) injection  40 mg Subcutaneous Q24H  . furosemide  40 mg Intravenous Daily  . insulin aspart  0-15 Units Subcutaneous TID WC  . insulin aspart  0-5 Units Subcutaneous QHS  . memantine  28 mg Oral Daily  . mometasone-formoterol  2 puff Inhalation BID  . neomycin-bacitracin-polymyxin   Topical BID  . predniSONE  50 mg Oral Q breakfast  . probenecid  250 mg Oral Daily  . QUEtiapine  25 mg Oral QHS   Continuous Infusions:  PRN Meds: acetaminophen, albuterol, hydrALAZINE, ipratropium-albuterol  Time spent: 35 minutes  Author: Berle Mull, MD [facility] Pager: [phone] 09/08/2016 7:29 PM  If 7PM-7AM, please contact night-coverage at [email or web address], password [facility]

## 2016-09-09 DIAGNOSIS — J181 Lobar pneumonia, unspecified organism: Secondary | ICD-10-CM

## 2016-09-09 LAB — GLUCOSE, CAPILLARY
GLUCOSE-CAPILLARY: 154 mg/dL — AB (ref 65–99)
Glucose-Capillary: 160 mg/dL — ABNORMAL HIGH (ref 65–99)

## 2016-09-09 MED ORDER — QUETIAPINE FUMARATE 25 MG PO TABS: 25.0000 mg | ORAL_TABLET | Freq: Two times a day (BID) | ORAL | 0 refills | Status: DC

## 2016-09-09 MED ORDER — FUROSEMIDE 20 MG PO TABS: 40.0000 mg | ORAL_TABLET | Freq: Every day | ORAL | 0 refills | Status: DC

## 2016-09-09 MED ORDER — PREDNISONE 20 MG PO TABS: ORAL_TABLET | ORAL | 0 refills | Status: DC

## 2016-09-09 MED ORDER — HALOPERIDOL LACTATE 5 MG/ML IJ SOLN
1.0000 mg | Freq: Once | INTRAMUSCULAR | Status: AC
  Administered 2016-09-09: 1 mg via INTRAVENOUS
  Filled 2016-09-09: qty 1

## 2016-09-09 MED ORDER — AMOXICILLIN-POT CLAVULANATE 500-125 MG PO TABS: 1.0000 | ORAL_TABLET | Freq: Three times a day (TID) | ORAL | 0 refills | Status: AC

## 2016-09-09 MED ORDER — FUROSEMIDE 40 MG PO TABS
40.0000 mg | ORAL_TABLET | Freq: Every day | ORAL | Status: DC
  Administered 2016-09-09: 40 mg via ORAL
  Filled 2016-09-09: qty 1

## 2016-09-09 NOTE — Progress Notes (Signed)
CM spoke with granddaughter [name] whom pt lives with and made her aware of d/c plan. [name] stated she is home and will receive pt from [place]. [facility] called per CM for pt 's transportation to home . Whitman Hero RN,BSN,CM

## 2016-09-09 NOTE — Progress Notes (Signed)
Patient refusing to get in bed. RN and NT talked with patient about safety. Patient states that staff is stealing from her and would like to call the police. Patient continues to yell and argue with patient. NP notified.

## 2016-09-09 NOTE — Progress Notes (Signed)
Physical Therapy Treatment Patient Details Name: [name] MRN: [id number] DOB: 1933-06-09 Today's Date: 09/09/2016    History of Present Illness 83 year old female admitted after taking a pill she was not supposed to take, resulting in lethargy, also found to have sepsis secondary to community acquired pna    PT Comments    Patient extremely distracted throughout session, perseverating on finding clothes and going home. Patient mobilizing with improvements in activity tolerance but continues to require min to moderate assist for stability during increased activity. Continue to recommend SNF upon acute discharge.   Follow Up Recommendations  SNF;Supervision/Assistance - 24 hour     Equipment Recommendations  Rolling walker with 5" wheels    Recommendations for Other Services       Precautions / Restrictions Precautions Precautions: Fall Restrictions Weight Bearing Restrictions: No    Mobility  Bed Mobility               General bed mobility comments: received EOB  Transfers Overall transfer level: Needs assistance Equipment used: 1 person hand held assist Transfers: Sit to/from Stand;Stand Pivot Transfers Sit to Stand: Min assist Stand pivot transfers: Min assist       General transfer comment: Min assist for stability during power up, performed x3 during session  Ambulation/Gait Ambulation/Gait assistance: Min assist;Mod assist Ambulation Distance (Feet): 110 Feet (+80 ft after seated rest break) Assistive device: 1 person hand held assist Gait Pattern/deviations: Step-to pattern;Decreased stride length;Shuffle;Trunk flexed;Staggering right;Staggering left Gait velocity: decreased Gait velocity interpretation: Below normal speed for age/gender General Gait Details: patient with noted instability with gait, easily distracted throughout session, max cues to redirect. Min to moderate assist during mobility with multiple episodes of LOB   Stairs             Wheelchair Mobility    Modified Rankin (Stroke Patients Only)       Balance Overall balance assessment: Needs assistance   Sitting balance-Leahy Scale: Fair Sitting balance - Comments: some posterior lean noted     Standing balance-Leahy Scale: Poor Standing balance comment: reliance on UE support and assist during mobility                            Cognition Arousal/Alertness: Awake/alert Behavior During Therapy: Flat affect Overall Cognitive Status: Impaired/Different from baseline Area of Impairment: Orientation;Attention;Following commands;Safety/judgement;Awareness;Problem solving                 Orientation Level: Disoriented to;Place;Time;Situation Current Attention Level: Sustained   Following Commands: Follows one step commands with increased time Safety/Judgement: Decreased awareness of safety;Decreased awareness of deficits Awareness: Intellectual Problem Solving: Slow processing;Decreased initiation;Difficulty sequencing;Requires verbal cues;Requires tactile cues General Comments: incredibly confused and perseverative throughout session. Difficult to redirect      Exercises      General Comments        Pertinent Vitals/Pain Pain Assessment: No/denies pain    Home Living                      Prior Function            PT Goals (current goals can now be found in the care plan section) Acute Rehab PT Goals Patient Stated Goal: none stated PT Goal Formulation: With patient Time For Goal Achievement: 09/21/16 Potential to Achieve Goals: Good Progress towards PT goals: Progressing toward goals    Frequency    Min 2X/week  PT Plan Current plan remains appropriate    Co-evaluation              AM-PAC PT "6 Clicks" Daily Activity  Outcome Measure  Difficulty turning over in bed (including adjusting bedclothes, sheets and blankets)?: Total Difficulty moving from lying on back to sitting on the  side of the bed? : Total Difficulty sitting down on and standing up from a chair with arms (e.g., wheelchair, bedside commode, etc,.)?: Total Help needed moving to and from a bed to chair (including a wheelchair)?: A Little Help needed walking in hospital room?: A Lot Help needed climbing 3-5 steps with a railing? : A Lot 6 Click Score: 10    End of Session Equipment Utilized During Treatment: Gait belt;Oxygen Activity Tolerance: Patient tolerated treatment well;Patient limited by fatigue Patient left: in chair;with call bell/phone within reach;with chair alarm set Nurse Communication: Mobility status PT Visit Diagnosis: Unsteadiness on feet (R26.81);Muscle weakness (generalized) (M62.81)     Time: 5374-8270 PT Time Calculation (min) (ACUTE ONLY): 18 min  Charges:  $Gait Training: 8-22 mins                    G Codes:       Alben Deeds, PT DPT NCS [phone]    Duncan Dull 09/09/2016, 10:02 AM

## 2016-09-09 NOTE — Care Management Note (Addendum)
Case Management Note  Patient Details  Name: [name] MRN: [id number] Date of Birth: 1933-12-12  Subjective/Objective:      Admitted with Acute encephalopathy. Resides with granddaughter.              Action/Plan: Plan is to d/c to home today with home health services. Pt resides with granddaughter, [name] @ [phone]. CM called to speak with [name] to discuss discharge plan and confirm transportation to home, however , call was unsuccessful and CM was unable to leave voice message. CM will call [place], [phone], for transportation services to home after confirming granddaughter [name] is home and will received pt.  Expected Discharge Date:  09/09/16               Expected Discharge Plan:  [place]  In-House Referral:     Discharge planning Services  CM Consult  Post Acute Care Choice:    Choice offered to:  Patient, Adult Children  DME Arranged:  [facility] rolling DME Agency:  [place] Arranged:  PT, OT, RN, NA,SW [place]:  [facility] (now [facility]), referral made with [facility] @ [phone]  Status of Service:  Completed, signed off  If discussed at [place] of Stay Meetings, dates discussed:    Additional Comments:  Sharin Mons, RN 09/09/2016, 11:53 AM

## 2016-09-09 NOTE — Discharge Summary (Signed)
[facility]  Physician Discharge Summary   Patient ID: [name] MRN: [id number] DOB/AGE: 81-Sep-1935 81 y.o.  Admit date: 09/04/2016 Discharge date: 09/09/2016  PCP: Seward Carol, MD  DISCHARGE DIAGNOSES:  Principal Problem:   Sepsis [facility]) Active Problems:   Diabetes mellitus type 2, controlled ([place])   Community acquired pneumonia   Toxic metabolic encephalopathy   RECOMMENDATIONS FOR OUTPATIENT FOLLOW UP: 1. Outpatient follow-up with primary care provider 2. Home health ordered  DISCHARGE CONDITION: fair  Diet recommendation: As before  [facility] Weights   09/05/16 0106 09/05/16 0659  Weight: 67 kg (147 lb 11.2 oz) 67.9 kg (149 lb 9.6 oz)    INITIAL HISTORY: Patient with PMH of asthma, type II DM, colostomy, dementia; admitted on 09/04/2016, presented with complaint of confusion, was found to have likely unknown accidental drug overdose. Also noted to have asthma exacerbation   HOSPITAL COURSE:   Accidental unintentional drug overdose/Acute encephalopathy. Patient may have taken Darvocet accidentally. This may have resulted in worsening confusion. However, she also has dementia and has cognitive impairment at baseline, which could've also contributed. Patient underwent MRI brain which was negative. EEG was unremarkable. No focal neurological deficits noted. Family was told to discard all Darvocet tablets. Patient's mental status appears to be back to baseline.   Dementia. She may resume her home medications. Seroquel has been added for delirium. Discussed in detail with patient's daughter today.   Type 2 diabetes mellitus. Stable. Resume home medication  Asthma exacerbation/Community-acquired pneumonia Could have aspirated as well. There was no evidence of sepsis. Treated with antibiotics. Continue Augmentin for a few more days. Stable now.   Acute on Chronic diastolic CHF/Essential hypertension/Sinus bradycardia Patient on Lasix at home on a  scheduled basis. Suspecting she has diastolic dysfunction. She was given IV Lasix. She has improved. We'll continue her home dose of Lasix. Still has some lower extremity edema, but saturating well on room air. No crackles on examination of the lung.  Overall, stable. Patient seems to be medically optimized. Still has moments of confusion and tends to wander about. She has undergone extensive workup for encephalopathy. Most of her symptoms appear to be due to her dementia. This was communicated to patient's daughter. Physical therapy does recommend placement to skilled nursing facility for rehabilitation. However, daughter declines and says that patient has adequate support at home. Home health will be ordered.   PERTINENT LABS:  The results of significant diagnostics from this hospitalization (including imaging, microbiology, ancillary and laboratory) are listed below for reference.    Microbiology: Recent Results (from the past 240 hour(s))  Blood Culture (routine x 2)     Status: None (Preliminary result)   Collection Time: 09/05/16 12:57 AM  Result Value Ref Range Status   Specimen Description BLOOD LEFT FOREARM  Final   Special Requests   Final    BOTTLES DRAWN AEROBIC AND ANAEROBIC Blood Culture adequate volume   Culture NO GROWTH 4 DAYS  Final   Report Status PENDING  Incomplete  Urine culture     Status: None   Collection Time: 09/05/16  1:30 AM  Result Value Ref Range Status   Specimen Description URINE, CATHETERIZED  Final   Special Requests NONE  Final   Culture NO GROWTH  Final   Report Status 09/06/2016 FINAL  Final  Blood Culture (routine x 2)     Status: None (Preliminary result)   Collection Time: 09/05/16  7:35 AM  Result Value Ref Range Status   Specimen Description BLOOD  BLOOD RIGHT HAND  Final   Special Requests IN PEDIATRIC BOTTLE BCAV  Final   Culture NO GROWTH 4 DAYS  Final   Report Status PENDING  Incomplete     Labs: Basic Metabolic Panel:  Recent  Labs Lab 09/05/16 0057 09/06/16 0604 09/07/16 0526 09/08/16 0810  NA 142 142 141 139  K 3.7 3.4* 3.8 4.1  CL 104 111 112* 111  CO2 26 25 22  21*  GLUCOSE 137* 120* 149* 180*  BUN 22* 13 7 5*  CREATININE 1.35* 1.00 0.79 0.72  CALCIUM 9.2 7.6* 7.8* 8.1*  MG  --  1.5*  --   --    Liver Function Tests:  Recent Labs Lab 09/05/16 0057 09/06/16 0604  AST 22 15  ALT 17 12*  ALKPHOS 75 51  BILITOT 0.8 0.6  PROT 7.0 5.6*  ALBUMIN 3.5 2.5*   CBC:  Recent Labs Lab 09/05/16 0057 09/06/16 0604 09/07/16 0526 09/08/16 0810  WBC 14.0* 9.3 7.9 4.9  NEUTROABS 10.7* 6.7  --   --   HGB 12.9 10.7* 11.0* 11.5*  HCT 40.8 34.1* 34.5* 35.0*  MCV 97.8 99.4 96.6 93.8  PLT 203 151 142* 199    CBG:  Recent Labs Lab 09/08/16 1229 09/08/16 1708 09/08/16 2140 09/09/16 0801 09/09/16 1152  GLUCAP 251* 225* 138* 160* 154*     IMAGING STUDIES Ct Head Wo Contrast  Result Date: 09/05/2016 CLINICAL DATA:  Altered mental status, pinpoint pupils. Suspected urinary tract infection. EXAM: CT HEAD WITHOUT CONTRAST TECHNIQUE: Contiguous axial images were obtained from the base of the skull through the vertex without intravenous contrast. COMPARISON:  MRI of the head January 09, 2015 FINDINGS: BRAIN: No intraparenchymal hemorrhage, mass effect nor midline shift. The ventricles and sulci are normal for age. Patchy supratentorial white matter hypodensities less than expected for patient's age, though non-specific are most compatible with chronic small vessel ischemic disease. No acute large vascular territory infarcts. No abnormal extra-axial fluid collections. Basal cisterns are patent. Mild cerebellar tonsillar ectopia better characterized on prior MRI name VASCULAR: Moderate calcific atherosclerosis of the carotid siphons. SKULL: No skull fracture. Expanded empty sella. No significant scalp soft tissue swelling. SINUSES/ORBITS: Lobulated paranasal sinus mucosal thickening, status post FESS mastoid air  cells are well aerated. Soft tissue within main external auditory canals compatible with cerumen. The included ocular globes and orbital contents are non-suspicious. OTHER: None. IMPRESSION: No acute intracranial process. Stable examination including empty sella and mild cerebellar tonsillar ectopia. Electronically Signed   By: Elon Alas M.D.   On: 09/05/2016 02:36   Mr Brain Wo Contrast  Result Date: 09/05/2016 CLINICAL DATA:  Acute encephalopathy. EXAM: MRI HEAD WITHOUT CONTRAST TECHNIQUE: Multiplanar, multiecho pulse sequences of the brain and surrounding structures were obtained without intravenous contrast. COMPARISON:  Head CT 09/05/2016 and MRI 01/09/2015 FINDINGS: Brain: A moderately expanded partially empty sella is unchanged from the prior MRI. Cerebellar tonsillar ectopia measures 5-6 mm, also not significantly changed and with a mildly pointed appearance of the tonsils again noted. There is no evidence of acute infarct, intracranial hemorrhage, mass, midline shift, or extra-axial fluid collection. Mild cerebral atrophy is within normal limits for age. Small foci of T2 hyperintensity scattered in the cerebral white matter bilaterally are unchanged from the prior MRI and nonspecific but compatible with minimal chronic small vessel ischemic disease, not greater than expected for patient's age. Vascular: Major intracranial vascular flow voids are preserved. Skull and upper cervical spine: Unremarkable bone marrow signal. Sinuses/Orbits: Unremarkable orbits. Prior  functional sinus surgery. Large volume fluid nearly completely opacifies both sphenoid sinuses. There is extensive left ethmoid sinus opacification and moderate left frontal sinus mucosal thickening. Mild right ethmoid air cell mucosal thickening is present. A trace right mastoid effusion is noted. Other: None. IMPRESSION: 1. No acute intracranial abnormality. 2. Unchanged empty sella and cerebellar tonsillar ectopia. 3. Paranasal sinus  inflammatory disease as above, possibly acute on chronic. Electronically Signed   By: Logan Bores M.D.   On: 09/05/2016 09:59   Dg Chest Port 1 View  Result Date: 09/05/2016 CLINICAL DATA:  83 year old female with altered mental status. EXAM: PORTABLE CHEST 1 VIEW COMPARISON:  Chest radiograph dated 04/11/2012 FINDINGS: Single portable view of the chest demonstrates an area increased density in the left lower lung field concerning for developing infiltrate. Clinical correlation follow-up recommended. There is no pleural effusion or pneumothorax. The cardiac silhouette is within normal limits. The aorta is mildly tortuous. There is degenerative changes of the spine and shoulders. No acute osseous pathology. IMPRESSION: Left lung base density concerning for developing infiltrate. Clinical correlation follow-up recommended. Electronically Signed   By: Anner Crete M.D.   On: 09/05/2016 00:45    DISCHARGE EXAMINATION: Vitals:   09/08/16 0853 09/08/16 1444 09/08/16 2130 09/09/16 0419  BP:  (!) 149/77 (!) 152/81 (!) 167/79  Pulse: 67 82 73 86  Resp: 16 18 18 18   Temp:  97.8 F (36.6 C) 98 F (36.7 C) 98.5 F (36.9 C)  TempSrc:   Oral Oral  SpO2: 99% 100% 100% 95%  Weight:      Height:       General appearance: alert, cooperative, appears stated age, distracted and no distress Resp: clear to auscultation bilaterally Cardio: regular rate and rhythm, S1, S2 normal, no murmur, click, rub or gallop. Pedal edema is noted GI: soft, non-tender; bowel sounds normal; no masses,  no organomegaly  DISPOSITION: Home with home health  Discharge Instructions    Call MD for:  difficulty breathing, headache or visual disturbances    Complete by:  As directed    Call MD for:  extreme fatigue    Complete by:  As directed    Call MD for:  persistant dizziness or light-headedness    Complete by:  As directed    Call MD for:  persistant nausea and vomiting    Complete by:  As directed    Call MD for:   severe uncontrolled pain    Complete by:  As directed    Call MD for:  temperature >100.4    Complete by:  As directed    Discharge instructions    Complete by:  As directed    Please follow up with your PCP in 1 week.  You were cared for by a hospitalist during your hospital stay. If you have any questions about your discharge medications or the care you received while you were in the hospital after you are discharged, you can call the unit and asked to speak with the hospitalist on call if the hospitalist that took care of you is not available. Once you are discharged, your primary care physician will handle any further medical issues. Please note that NO REFILLS for any discharge medications will be authorized once you are discharged, as it is imperative that you return to your primary care physician (or establish a relationship with a primary care physician if you do not have one) for your aftercare needs so that they can reassess your need for medications  and monitor your lab values. If you do not have a primary care physician, you can call [phone] for a physician referral.   Increase activity slowly    Complete by:  As directed       ALLERGIES:  Allergies  Allergen Reactions  . Aspirin Other (See Comments)    Triggers asthma     Discharge Medication List as of 09/09/2016  1:40 PM    START taking these medications   Details  amoxicillin-clavulanate (AUGMENTIN) 500-125 MG tablet Take 1 tablet (500 mg total) by mouth 3 (three) times daily., Starting Wed 09/09/2016, Until Sat 09/12/2016, Print    predniSONE (DELTASONE) 20 MG tablet Take 3 tablets once daily for 3 days, then take 2 tablets once daily for 3 days, then take 1 tablet once daily for 3 days, then STOP., Print    QUEtiapine (SEROQUEL) 25 MG tablet Take 1 tablet (25 mg total) by mouth 2 (two) times daily., Starting Wed 09/09/2016, Print      CONTINUE these medications which have CHANGED   Details  furosemide (LASIX) 20 MG  tablet Take 2 tablets (40 mg total) by mouth daily., Starting Wed 09/09/2016, Print      CONTINUE these medications which have NOT CHANGED   Details  acetaminophen (TYLENOL) 325 MG tablet Take 650 mg by mouth every 6 (six) hours as needed for headache (pain)., Historical Med    albuterol (PROVENTIL HFA;VENTOLIN HFA) 108 (90 Base) MCG/ACT inhaler Inhale 1-2 puffs into the lungs every 4 (four) hours as needed for wheezing or shortness of breath. , Historical Med    alendronate (FOSAMAX) 70 MG tablet Take 70 mg by mouth every Saturday. , Historical Med    amLODipine (NORVASC) 5 MG tablet Take 1 tablet (5 mg total) by mouth daily., Starting Tue 04/12/2012, No Print    Calcium Citrate-Vitamin D (CALCIUM CITRATE + D) 315-250 MG-UNIT TABS Take 1 tablet by mouth daily., Historical Med    donepezil (ARICEPT) 10 MG tablet Take 10 mg by mouth at bedtime. , Starting Sun 02/24/2015, Historical Med    gabapentin (NEURONTIN) 300 MG capsule Take 300 mg by mouth 3 (three) times daily., Starting Sat 10/12/2015, Historical Med    glipiZIDE (GLUCOTROL XL) 5 MG 24 hr tablet Take 5 mg by mouth 2 (two) times daily with a meal. , Historical Med    losartan (COZAAR) 50 MG tablet Take 50 mg by mouth daily., Starting Wed 07/08/2016, Historical Med    memantine (NAMENDA XR) 28 MG CP24 24 hr capsule Take 28 mg by mouth daily., Historical Med    probenecid (BENEMID) 500 MG tablet Take 250 mg by mouth daily. , Historical Med         Follow-up Information    Seward Carol, MD. Schedule an appointment as soon as possible for a visit on 09/16/2016.   Specialty:  Internal Medicine Why:  Called office and left message  Contact information: [address] [place] [address] [phone]        [facility] Follow up.   Specialty:  [place] Why:  home health services arranged, office will call and set up home visits Contact information: [address] [place]  [address] [place] Follow up.   Why:  rolling walker will be delivered to pt prior to discharge Contact information: [address] [place] [address] [phone]  TOTAL DISCHARGE TIME: 35 minutes  [facility] Pager [phone]  09/09/2016, 4:44 PM

## 2016-09-09 NOTE — Discharge Instructions (Signed)
Dementia Dementia is the loss of two or more brain functions, such as:  Memory.  Decision making.  Behavior.  Speaking.  Thinking.  Problem solving.  There are many types of dementia. The most common type is called progressive dementia. Progressive dementia gets worse with time and it is irreversible. An example of this type of dementia is Alzheimer disease. What are the causes? This condition may be caused by:  Nerve cell damage in the brain.  Genetic mutations.  Certain medicines.  Multiple small strokes.  An infection, such as chronic meningitis.  A metabolic problem, such as vitamin B12 deficiency or thyroid disease.  Pressure on the brain, such as from a tumor or blood clot.  What are the signs or symptoms? Symptoms of this condition include:  Sudden changes in mood.  Depression.  Problems with balance.  Changes in personality.  Poor short-term memory.  Agitation.  Delusions.  Hallucinations.  Having a hard time: ? Speaking thoughts. ? Finding words. ? Solving problems. ? Doing familiar tasks. ? Understanding familiar ideas.  How is this diagnosed? This condition is diagnosed with an assessment by your health care provider. During this assessment, your health care provider will talk with you and your family, friends, or caregivers about your symptoms. A thorough medical history will be taken, and you will have a physical exam and tests. Tests may include:  Lab tests, such as blood or urine tests.  Imaging tests, such as a CT scan, PET scan, or MRI.  A lumbar puncture. This test involves removing and testing a small amount of the fluid that surrounds the brain and spinal cord.  An electroencephalogram (EEG). In this test, small metal discs are used to measure electrical activity in the brain.  Memory tests, cognitive tests, and neuropsychological tests. These tests evaluate brain function.  How is this treated? Treatment depends on the  cause of the dementia. It may involve taking medicines that may help:  To control the dementia.  To slow down the disease.  To manage symptoms.  In some cases, treating the cause of the dementia can improve symptoms, reverse symptoms, or slow down how quickly the dementia gets worse. Your health care provider can help direct you to support groups, organizations, and other health care providers who can help with decisions about your care. Follow these instructions at home: Medicine  Take over-the-counter and prescription medicines only as told by your health care provider.  Avoid taking medicines that can affect thinking, such as pain or sleeping medicines. Lifestyle   Make healthy lifestyle choices: ? Be physically active as told by your health care provider. ? Do not use any tobacco products, such as cigarettes, chewing tobacco, and e-cigarettes. If you need help quitting, ask your health care provider. ? Eat a healthy diet. ? Practice stress-management techniques when you get stressed. ? Stay social.  Drink enough fluid to keep your urine clear or pale yellow.  Make sure to get quality sleep. These tips can help you to get a good night's rest: ? Avoid napping during the day. ? Keep your sleeping area dark and cool. ? Avoid exercising during the few hours before you go to bed. ? Avoid caffeine products in the evening. General instructions  Work with your health care provider to determine what you need help with and what your safety needs are.  If you were given a bracelet that tracks your location, make sure to wear it.  Keep all follow-up visits as told by your  health care provider. This is important. Contact a health care provider if:  You have any new symptoms.  You have problems with choking or swallowing.  You have any symptoms of a different illness. Get help right away if:  You develop a fever.  You have new or worsening confusion.  You have new or  worsening sleepiness.  You have a hard time staying awake.  You or your family members become concerned for your safety. This information is not intended to replace advice given to you by your health care provider. Make sure you discuss any questions you have with your health care provider. Document Released: 08/26/2000 Document Revised: 07/11/2015 Document Reviewed: 11/28/2014 Elsevier Interactive Patient Education  2017 [place].   Asthma, Adult Asthma is a condition of the lungs in which the airways tighten and narrow. Asthma can make it hard to breathe. Asthma cannot be cured, but medicine and lifestyle changes can help control it. Asthma may be started (triggered) by:  Animal skin flakes (dander).  Dust.  Cockroaches.  Pollen.  Mold.  Smoke.  Cleaning products.  Hair sprays or aerosol sprays.  Paint fumes or strong smells.  Cold air, weather changes, and winds.  Crying or laughing hard.  Stress.  Certain medicines or drugs.  Foods, such as dried fruit, potato chips, and sparkling grape juice.  Infections or conditions (colds, flu).  Exercise.  Certain medical conditions or diseases.  Exercise or tiring activities.  Follow these instructions at home:  Take medicine as told by your doctor.  Use a peak flow meter as told by your doctor. A peak flow meter is a tool that measures how well the lungs are working.  Record and keep track of the peak flow meter's readings.  Understand and use the asthma action plan. An asthma action plan is a written plan for taking care of your asthma and treating your attacks.  To help prevent asthma attacks: ? Do not smoke. Stay away from secondhand smoke. ? Change your heating and air conditioning filter often. ? Limit your use of fireplaces and wood stoves. ? Get rid of pests (such as roaches and mice) and their droppings. ? Throw away plants if you see mold on them. ? Clean your floors. Dust regularly. Use  cleaning products that do not smell. ? Have someone vacuum when you are not home. Use a vacuum cleaner with a HEPA filter if possible. ? Replace carpet with wood, tile, or vinyl flooring. Carpet can trap animal skin flakes and dust. ? Use allergy-proof pillows, mattress covers, and box spring covers. ? Wash bed sheets and blankets every week in hot water and dry them in a dryer. ? Use blankets that are made of polyester or cotton. ? Clean bathrooms and kitchens with bleach. If possible, have someone repaint the walls in these rooms with mold-resistant paint. Keep out of the rooms that are being cleaned and painted. ? Wash hands often. Contact a doctor if:  You have make a whistling sound when breaking (wheeze), have shortness of breath, or have a cough even if taking medicine to prevent attacks.  The colored mucus you cough up (sputum) is thicker than usual.  The colored mucus you cough up changes from clear or white to yellow, green, gray, or bloody.  You have problems from the medicine you are taking such as: ? A rash. ? Itching. ? Swelling. ? Trouble breathing.  You need reliever medicines more than 2-3 times a week.  Your peak flow  measurement is still at 50-79% of your personal best after following the action plan for 1 hour.  You have a fever. Get help right away if:  You seem to be worse and are not responding to medicine during an asthma attack.  You are short of breath even at rest.  You get short of breath when doing very little activity.  You have trouble eating, drinking, or talking.  You have chest pain.  You have a fast heartbeat.  Your lips or fingernails start to turn blue.  You are light-headed, dizzy, or faint.  Your peak flow is less than 50% of your personal best. This information is not intended to replace advice given to you by your health care provider. Make sure you discuss any questions you have with your health care provider. Document Released:  08/19/2007 Document Revised: 08/08/2015 Document Reviewed: 09/29/2012 Elsevier Interactive Patient Education  2017 [employer].

## 2016-09-10 LAB — CULTURE, BLOOD (ROUTINE X 2)
Culture: NO GROWTH
Culture: NO GROWTH
SPECIAL REQUESTS: ADEQUATE

## 2016-09-22 ENCOUNTER — Other Ambulatory Visit: Payer: Self-pay | Admitting: Internal Medicine

## 2016-09-22 DIAGNOSIS — R600 Localized edema: Secondary | ICD-10-CM

## 2016-10-01 ENCOUNTER — Ambulatory Visit (HOSPITAL_COMMUNITY): Payer: Medicare Other | Attending: Cardiology

## 2016-10-01 ENCOUNTER — Other Ambulatory Visit: Payer: Self-pay

## 2016-10-01 DIAGNOSIS — R600 Localized edema: Secondary | ICD-10-CM | POA: Diagnosis not present

## 2016-10-01 DIAGNOSIS — I5189 Other ill-defined heart diseases: Secondary | ICD-10-CM | POA: Insufficient documentation

## 2016-10-08 ENCOUNTER — Emergency Department (HOSPITAL_COMMUNITY): Payer: Medicare Other

## 2016-10-08 ENCOUNTER — Observation Stay (HOSPITAL_COMMUNITY)
Admission: EM | Admit: 2016-10-08 | Discharge: 2016-10-09 | Disposition: A | Discharge status: Home / Self Care | Payer: Medicare Other | Attending: Internal Medicine | Admitting: Internal Medicine

## 2016-10-08 ENCOUNTER — Encounter (HOSPITAL_COMMUNITY): Payer: Self-pay | Admitting: Emergency Medicine

## 2016-10-08 ENCOUNTER — Other Ambulatory Visit: Payer: Self-pay

## 2016-10-08 DIAGNOSIS — N179 Acute kidney failure, unspecified: Secondary | ICD-10-CM | POA: Diagnosis present

## 2016-10-08 DIAGNOSIS — J45909 Unspecified asthma, uncomplicated: Secondary | ICD-10-CM | POA: Diagnosis not present

## 2016-10-08 DIAGNOSIS — I130 Hypertensive heart and chronic kidney disease with heart failure and stage 1 through stage 4 chronic kidney disease, or unspecified chronic kidney disease: Secondary | ICD-10-CM | POA: Diagnosis not present

## 2016-10-08 DIAGNOSIS — M869 Osteomyelitis, unspecified: Secondary | ICD-10-CM | POA: Diagnosis not present

## 2016-10-08 DIAGNOSIS — R55 Syncope and collapse: Secondary | ICD-10-CM | POA: Diagnosis not present

## 2016-10-08 DIAGNOSIS — A419 Sepsis, unspecified organism: Secondary | ICD-10-CM | POA: Diagnosis present

## 2016-10-08 DIAGNOSIS — I5032 Chronic diastolic (congestive) heart failure: Secondary | ICD-10-CM | POA: Insufficient documentation

## 2016-10-08 DIAGNOSIS — E118 Type 2 diabetes mellitus with unspecified complications: Secondary | ICD-10-CM | POA: Diagnosis not present

## 2016-10-08 DIAGNOSIS — R5383 Other fatigue: Secondary | ICD-10-CM

## 2016-10-08 DIAGNOSIS — F0390 Unspecified dementia without behavioral disturbance: Secondary | ICD-10-CM | POA: Diagnosis not present

## 2016-10-08 DIAGNOSIS — Z8701 Personal history of pneumonia (recurrent): Secondary | ICD-10-CM | POA: Insufficient documentation

## 2016-10-08 DIAGNOSIS — R9389 Abnormal findings on diagnostic imaging of other specified body structures: Secondary | ICD-10-CM | POA: Diagnosis present

## 2016-10-08 DIAGNOSIS — Z79899 Other long term (current) drug therapy: Secondary | ICD-10-CM | POA: Diagnosis not present

## 2016-10-08 DIAGNOSIS — D508 Other iron deficiency anemias: Secondary | ICD-10-CM | POA: Diagnosis present

## 2016-10-08 DIAGNOSIS — E1122 Type 2 diabetes mellitus with diabetic chronic kidney disease: Secondary | ICD-10-CM | POA: Diagnosis not present

## 2016-10-08 DIAGNOSIS — Z886 Allergy status to analgesic agent status: Secondary | ICD-10-CM | POA: Diagnosis not present

## 2016-10-08 DIAGNOSIS — D509 Iron deficiency anemia, unspecified: Secondary | ICD-10-CM | POA: Diagnosis not present

## 2016-10-08 DIAGNOSIS — E119 Type 2 diabetes mellitus without complications: Secondary | ICD-10-CM

## 2016-10-08 DIAGNOSIS — Z7984 Long term (current) use of oral hypoglycemic drugs: Secondary | ICD-10-CM | POA: Diagnosis not present

## 2016-10-08 DIAGNOSIS — E1169 Type 2 diabetes mellitus with other specified complication: Secondary | ICD-10-CM | POA: Insufficient documentation

## 2016-10-08 HISTORY — DX: Colostomy status: Z93.3

## 2016-10-08 HISTORY — DX: Unspecified dementia, unspecified severity, without behavioral disturbance, psychotic disturbance, mood disturbance, and anxiety: F03.90

## 2016-10-08 HISTORY — DX: Type 2 diabetes mellitus without complications: E11.9

## 2016-10-08 HISTORY — DX: Essential (primary) hypertension: I10

## 2016-10-08 HISTORY — DX: Pneumonia, unspecified organism: J18.9

## 2016-10-08 LAB — DIFFERENTIAL
BASOS ABS: 0 10*3/uL (ref 0.0–0.1)
Basophils Relative: 0 %
EOS ABS: 0 10*3/uL (ref 0.0–0.7)
Eosinophils Relative: 0 %
LYMPHS ABS: 1.6 10*3/uL (ref 0.7–4.0)
LYMPHS PCT: 18 %
Monocytes Absolute: 0.7 10*3/uL (ref 0.1–1.0)
Monocytes Relative: 8 %
NEUTROS PCT: 74 %
Neutro Abs: 6.4 10*3/uL (ref 1.7–7.7)

## 2016-10-08 LAB — COMPREHENSIVE METABOLIC PANEL
ALT: 10 U/L — ABNORMAL LOW (ref 14–54)
AST: 18 U/L (ref 15–41)
Albumin: 3 g/dL — ABNORMAL LOW (ref 3.5–5.0)
Alkaline Phosphatase: 91 U/L (ref 38–126)
Anion gap: 12 (ref 5–15)
BUN: 29 mg/dL — ABNORMAL HIGH (ref 6–20)
CHLORIDE: 104 mmol/L (ref 101–111)
CO2: 24 mmol/L (ref 22–32)
Calcium: 8.8 mg/dL — ABNORMAL LOW (ref 8.9–10.3)
Creatinine, Ser: 1.75 mg/dL — ABNORMAL HIGH (ref 0.44–1.00)
GFR, EST AFRICAN AMERICAN: 30 mL/min — AB (ref >60)
GFR, EST NON AFRICAN AMERICAN: 26 mL/min — AB (ref >60)
Glucose, Bld: 146 mg/dL — ABNORMAL HIGH (ref 65–99)
POTASSIUM: 4.4 mmol/L (ref 3.5–5.1)
Sodium: 140 mmol/L (ref 135–145)
Total Bilirubin: 0.5 mg/dL (ref 0.3–1.2)
Total Protein: 6.6 g/dL (ref 6.5–8.1)

## 2016-10-08 LAB — CBC
HCT: 38.9 % (ref 36.0–46.0)
Hemoglobin: 12.6 g/dL (ref 12.0–15.0)
MCH: 30.9 pg (ref 26.0–34.0)
MCHC: 32.4 g/dL (ref 30.0–36.0)
MCV: 95.3 fL (ref 78.0–100.0)
PLATELETS: 197 10*3/uL (ref 150–400)
RBC: 4.08 MIL/uL (ref 3.87–5.11)
RDW: 13.9 % (ref 11.5–15.5)
WBC: 8.7 10*3/uL (ref 4.0–10.5)

## 2016-10-08 LAB — VITAMIN B12: Vitamin B-12: 352 pg/mL (ref 180–914)

## 2016-10-08 LAB — GLUCOSE, CAPILLARY: Glucose-Capillary: 95 mg/dL (ref 65–99)

## 2016-10-08 LAB — PROCALCITONIN: Procalcitonin: <0.1 ng/mL

## 2016-10-08 LAB — TSH: TSH: 0.976 u[IU]/mL (ref 0.350–4.500)

## 2016-10-08 LAB — CBG MONITORING, ED: GLUCOSE-CAPILLARY: 133 mg/dL — AB (ref 65–99)

## 2016-10-08 MED ORDER — ONDANSETRON HCL 4 MG PO TABS: 4.0000 mg | ORAL_TABLET | Freq: Four times a day (QID) | ORAL | Status: DC | PRN

## 2016-10-08 MED ORDER — ALENDRONATE SODIUM 70 MG PO TABS: 70.0000 mg | ORAL_TABLET | ORAL | Status: DC

## 2016-10-08 MED ORDER — GABAPENTIN 100 MG PO CAPS
100.0000 mg | ORAL_CAPSULE | Freq: Two times a day (BID) | ORAL | Status: DC
  Administered 2016-10-09: 100 mg via ORAL
  Filled 2016-10-08: qty 1

## 2016-10-08 MED ORDER — BISACODYL 5 MG PO TBEC
5.0000 mg | DELAYED_RELEASE_TABLET | Freq: Every day | ORAL | Status: DC | PRN
  Filled 2016-10-08: qty 1

## 2016-10-08 MED ORDER — ACETAMINOPHEN 325 MG PO TABS
650.0000 mg | ORAL_TABLET | Freq: Four times a day (QID) | ORAL | Status: DC | PRN
  Administered 2016-10-09: 650 mg via ORAL
  Filled 2016-10-08 (×2): qty 2

## 2016-10-08 MED ORDER — SODIUM CHLORIDE 0.9% FLUSH
3.0000 mL | Freq: Two times a day (BID) | INTRAVENOUS | Status: DC
  Administered 2016-10-08 – 2016-10-09 (×2): 3 mL via INTRAVENOUS

## 2016-10-08 MED ORDER — ALBUTEROL SULFATE (2.5 MG/3ML) 0.083% IN NEBU: 2.5000 mg | INHALATION_SOLUTION | RESPIRATORY_TRACT | Status: DC | PRN

## 2016-10-08 MED ORDER — ENOXAPARIN SODIUM 30 MG/0.3ML ~~LOC~~ SOLN
30.0000 mg | SUBCUTANEOUS | Status: DC
  Administered 2016-10-08: 30 mg via SUBCUTANEOUS
  Filled 2016-10-08: qty 0.3

## 2016-10-08 MED ORDER — ALBUTEROL SULFATE (2.5 MG/3ML) 0.083% IN NEBU
2.5000 mg | INHALATION_SOLUTION | Freq: Three times a day (TID) | RESPIRATORY_TRACT | Status: DC
  Administered 2016-10-08 – 2016-10-09 (×3): 2.5 mg via RESPIRATORY_TRACT
  Filled 2016-10-08 (×3): qty 3

## 2016-10-08 MED ORDER — AMLODIPINE BESYLATE 5 MG PO TABS
5.0000 mg | ORAL_TABLET | Freq: Every day | ORAL | Status: DC
  Administered 2016-10-09: 5 mg via ORAL
  Filled 2016-10-08: qty 1

## 2016-10-08 MED ORDER — DONEPEZIL HCL 10 MG PO TABS
10.0000 mg | ORAL_TABLET | Freq: Every day | ORAL | Status: DC
Start: 2016-10-08 — End: 2016-10-08

## 2016-10-08 MED ORDER — SODIUM CHLORIDE 0.9 % IV BOLUS (SEPSIS)
500.0000 mL | Freq: Once | INTRAVENOUS | Status: AC
  Administered 2016-10-08: 500 mL via INTRAVENOUS

## 2016-10-08 MED ORDER — ALBUTEROL SULFATE (2.5 MG/3ML) 0.083% IN NEBU
2.5000 mg | INHALATION_SOLUTION | RESPIRATORY_TRACT | Status: DC
  Administered 2016-10-08: 2.5 mg via RESPIRATORY_TRACT
  Filled 2016-10-08: qty 3

## 2016-10-08 MED ORDER — SODIUM CHLORIDE 0.9 % IV SOLN
INTRAVENOUS | Status: DC
  Administered 2016-10-08: 22:00:00 via INTRAVENOUS

## 2016-10-08 MED ORDER — INSULIN ASPART 100 UNIT/ML ~~LOC~~ SOLN
0.0000 [IU] | Freq: Three times a day (TID) | SUBCUTANEOUS | Status: DC
Start: 2016-10-08 — End: 2016-10-09
  Administered 2016-10-09: 1 [IU] via SUBCUTANEOUS
  Administered 2016-10-09: 3 [IU] via SUBCUTANEOUS

## 2016-10-08 MED ORDER — HYDROCODONE-ACETAMINOPHEN 5-325 MG PO TABS: 1.0000 | ORAL_TABLET | ORAL | Status: DC | PRN

## 2016-10-08 MED ORDER — TRAZODONE HCL 50 MG PO TABS: 25.0000 mg | ORAL_TABLET | Freq: Every evening | ORAL | Status: DC | PRN

## 2016-10-08 MED ORDER — GABAPENTIN 300 MG PO CAPS: 300.0000 mg | ORAL_CAPSULE | Freq: Three times a day (TID) | ORAL | Status: DC

## 2016-10-08 MED ORDER — ACETAMINOPHEN 650 MG RE SUPP: 650.0000 mg | Freq: Four times a day (QID) | RECTAL | Status: DC | PRN

## 2016-10-08 MED ORDER — ALBUTEROL SULFATE (2.5 MG/3ML) 0.083% IN NEBU: 2.5000 mg | INHALATION_SOLUTION | Freq: Two times a day (BID) | RESPIRATORY_TRACT | Status: DC

## 2016-10-08 MED ORDER — ONDANSETRON HCL 4 MG/2ML IJ SOLN: 4.0000 mg | Freq: Four times a day (QID) | INTRAMUSCULAR | Status: DC | PRN

## 2016-10-08 NOTE — ED Notes (Signed)
Patient transported to X-ray 

## 2016-10-08 NOTE — ED Notes (Signed)
Gave report to [employer], she will call once bed is ready.

## 2016-10-08 NOTE — ED Provider Notes (Signed)
[facility] DEPT Provider Note   CSN: [id number] Arrival date & time: 10/08/16  1156     History   Chief Complaint Chief Complaint  Patient presents with  . Loss of Consciousness    HPI [name] is a 83 y.o. female.  HPI 83 year old female who presents with fall. She has a history of diabetes and asthma. History is provided by patient's granddaughter who states that patient was walking into the kitchen today to have a drink water. Her boyfriend saw her collapse on the ground. They sat her in a chair noted that she had low blood pressures of 66/43 with a heart rate of 80. Given 500 mL of IV fluids by EMS. Patient does not remember her fall. States that she was unsure if she felt lightheaded but didn't feel weak this morning. No chest pain or difficulty breathing. No abdominal pain, back pain, vomiting, diarrhea, melena or hematochezia. Family has stated that she has had decreased fluid intake, but eating well.   Past Medical History:  Diagnosis Date  . Asthma   . Diabetes mellitus     Patient Active Problem List   Diagnosis Date Noted  . Syncope 10/08/2016  . Acute kidney injury ([place]) 10/08/2016  . Sepsis ([place]) 09/05/2016  . Community acquired pneumonia 09/05/2016  . Toxic metabolic encephalopathy 63/14/9702  . Diabetes mellitus type 2, controlled ([place]) 04/09/2012  . Nausea & vomiting 04/09/2012  . Gallstones 03/15/2011  . Partial SBO 03/15/2011  . Colostomy hernia ([place]) 03/15/2011  . PSEUDOGOUT 02/28/2007  . IRON DEFIC ANEMIA Morris DIET IRON INTAKE 02/28/2007  . DISCITIS 02/28/2007  . OSTEOMYELITIS 02/28/2007  . CHILLS WITHOUT FEVER 02/28/2007    History reviewed. No pertinent surgical history.  OB History    No data available       Home Medications    Prior to Admission medications   Medication Sig Start Date End Date Taking? Authorizing Provider  acetaminophen (TYLENOL) 325 MG tablet Take 650 mg by mouth every 6 (six) hours as needed for headache  (pain).   Yes [provider]  albuterol (PROVENTIL HFA;VENTOLIN HFA) 108 (90 Base) MCG/ACT inhaler Inhale 1-2 puffs into the lungs every 4 (four) hours as needed for wheezing or shortness of breath.    Yes [provider]  alendronate (FOSAMAX) 70 MG tablet Take 70 mg by mouth every Saturday.    Yes [provider]  Calcium Citrate-Vitamin D (CALCIUM CITRATE + D) 315-250 MG-UNIT TABS Take 1 tablet by mouth daily.   Yes [provider]  donepezil (ARICEPT) 10 MG tablet Take 10 mg by mouth at bedtime.  02/24/15  Yes [provider]  furosemide (LASIX) 20 MG tablet Take 2 tablets (40 mg total) by mouth daily. 09/09/16  Yes Bonnielee Haff, MD  gabapentin (NEURONTIN) 300 MG capsule Take 300 mg by mouth 3 (three) times daily. 10/12/15  Yes [provider]  glipiZIDE (GLUCOTROL XL) 5 MG 24 hr tablet Take 5 mg by mouth 2 (two) times daily with a meal.    Yes [provider]  losartan (COZAAR) 100 MG tablet Take 100 mg by mouth daily. 09/22/16  Yes [provider]  memantine (NAMENDA XR) 28 MG CP24 24 hr capsule Take 28 mg by mouth daily.   Yes [provider]  probenecid (BENEMID) 500 MG tablet Take 250 mg by mouth daily.    Yes [provider]  QUEtiapine (SEROQUEL) 25 MG tablet Take 1 tablet (25 mg total) by mouth 2 (two)  times daily. 09/09/16  Yes Bonnielee Haff, MD  amLODipine (NORVASC) 5 MG tablet Take 1 tablet (5 mg total) by mouth daily. Patient not taking: Reported on 09/05/2016 04/12/12   Seward Carol, MD  predniSONE (DELTASONE) 20 MG tablet Take 3 tablets once daily for 3 days, then take 2 tablets once daily for 3 days, then take 1 tablet once daily for 3 days, then STOP. Patient not taking: Reported on 10/08/2016 09/10/16   Bonnielee Haff, MD    Family History History reviewed. No pertinent family history.  Social History Social History  Substance Use Topics  . Smoking status: Never Smoker  . Smokeless  tobacco: Never Used  . Alcohol use No     Allergies   Aspirin   Review of Systems Review of Systems  Constitutional: Negative for fever.  Respiratory: Negative for cough and shortness of breath.   Cardiovascular: Negative for chest pain.  Gastrointestinal: Negative for abdominal pain.  Musculoskeletal: Negative for back pain and neck pain.  Hematological: Does not bruise/bleed easily.  All other systems reviewed and are negative.    Physical Exam Updated Vital Signs BP (!) 147/77 (BP Location: Right Arm)   Pulse 88   Temp 98.8 F (37.1 C) (Oral)   Resp 11   Ht 5\' 4"  (1.626 m)   Wt 71.7 kg (158 lb)   SpO2 94%   BMI 27.12 kg/m   Physical Exam Physical Exam  Nursing note and vitals reviewed. Constitutional: Well developed, well nourished, non-toxic, and in no acute distress Head: Normocephalic and atraumatic.  Mouth/Throat: Oropharynx is clear and moist.  Neck: Normal range of motion. Neck supple.  Cardiovascular: Normal rate and regular rhythm.   Pulmonary/Chest: Effort normal and breath sounds normal.  Abdominal: Soft. There is no tenderness. There is no rebound and no guarding.  Musculoskeletal: Normal range of motion.  Neurological: Sleeping, but arouses to voice and answers questions appropriately, no facial droop, fluent speech, moves all extremities symmetrically, no pronator drift, sensation to light touch intact throughout,PERRL Skin: Skin is warm and dry.  Psychiatric: Cooperative   ED Treatments / Results  Labs (all labs ordered are listed, but only abnormal results are displayed) Labs Reviewed  COMPREHENSIVE METABOLIC PANEL - Abnormal; Notable for the following:       Result Value   Glucose, Bld 146 (*)    BUN 29 (*)    Creatinine, Ser 1.75 (*)    Calcium 8.8 (*)    Albumin 3.0 (*)    ALT 10 (*)    GFR calc non Af Amer 26 (*)    GFR calc Af Amer 30 (*)    All other components within normal limits  CBG MONITORING, ED - Abnormal; Notable for the  following:    Glucose-Capillary 133 (*)    All other components within normal limits  CBC  DIFFERENTIAL  URINALYSIS, ROUTINE W REFLEX MICROSCOPIC    EKG  EKG Interpretation  Date/Time:  Thursday October 08 2016 12:25:53 EDT Ventricular Rate:  81 PR Interval:    QRS Duration: 96 QT Interval:  379 QTC Calculation: 440 R Axis:   -51 Text Interpretation:  Sinus rhythm Left anterior fascicular block similar to previous EKG  Confirmed by Brantley Stage [id number]) on 10/08/2016 2:37:14 PM       Radiology Dg Chest 2 View  Result Date: 10/08/2016 CLINICAL DATA:  Syncope, weakness, fatigue and hypoxia. EXAM: CHEST  2 VIEW COMPARISON:  09/05/2016 FINDINGS: Cardiomediastinal silhouette is normal. Mediastinal contours appear intact. There is  no evidence of pneumothorax. Low lung volumes with persistent peribronchial opacity in the left lung base. Small pleural effusions cannot be excluded. Osseous structures are without acute abnormality. Soft tissues are grossly normal. IMPRESSION: Low lung volumes with persistent peribronchial opacity in the left lung base. Bilateral small pleural effusions cannot be excluded. Electronically Signed   By: Fidela Salisbury M.D.   On: 10/08/2016 16:01   Ct Head Wo Contrast  Result Date: 10/08/2016 CLINICAL DATA:  Syncopal episode, fell striking head, feels more sleepy, history type II diabetes mellitus, asthma EXAM: CT HEAD WITHOUT CONTRAST CT CERVICAL SPINE WITHOUT CONTRAST TECHNIQUE: Multidetector CT imaging of the head and cervical spine was performed following the standard protocol without intravenous contrast. Multiplanar CT image reconstructions of the cervical spine were also generated. COMPARISON:  09/05/2016 FINDINGS: CT HEAD FINDINGS Brain: Generalized atrophy. Normal ventricular morphology. No midline shift or mass effect. Enlarged empty sella. Otherwise normal appearance of brain parenchyma. No intracranial hemorrhage, mass lesion or evidence acute infarction. No  extra-axial fluid collections. Cerebellar tonsillar ectopia better visualized on prior MR. Vascular: Mild atherosclerotic calcification at the internal carotid arteries at the skullbase Skull: Intact Sinuses/Orbits: Scattered mucosal thickening of the paranasal sinuses. Prior BILATERAL resections of the turbinates and the medial walls of the maxillary sinuses. No sinus air-fluid levels. Other: N/A CT CERVICAL SPINE FINDINGS Alignment: Normal alignment Skull base and vertebrae: Visualized skullbase intact. Prominent can S posterior to the odontoid process. Vertebral body heights maintained without fracture or bone destruction. Multilevel facet degenerative changes. Multilevel degenerative disc disease changes with disc space narrowing and endplate spur formation. Soft tissues and spinal canal: Prevertebral soft tissues normal thickness. Remaining visualized cervical soft tissues unremarkable Disc levels: Multilevel disc space narrowing and calcified bulging disc. AP narrowing of spinal canal especially C4-C5 and C5-C6. Encroachment upon cervical neural foramina bilaterally at C5-C6 and C6-C7 by uncovertebral spurs. Upper chest: Lung apices clear Other: Atherosclerotic calcifications at the carotid bifurcations. IMPRESSION: No acute intracranial abnormalities. Empty sella. Advanced multilevel degenerative disc and facet disease changes of the cervical spine with levels of AP spinal stenosis and BILATERAL neural foraminal stenosis as above. No acute cervical spine abnormalities. Electronically Signed   By: Lavonia Dana M.D.   On: 10/08/2016 13:11   Ct Cervical Spine Wo Contrast  Result Date: 10/08/2016 CLINICAL DATA:  Syncopal episode, fell striking head, feels more sleepy, history type II diabetes mellitus, asthma EXAM: CT HEAD WITHOUT CONTRAST CT CERVICAL SPINE WITHOUT CONTRAST TECHNIQUE: Multidetector CT imaging of the head and cervical spine was performed following the standard protocol without intravenous  contrast. Multiplanar CT image reconstructions of the cervical spine were also generated. COMPARISON:  09/05/2016 FINDINGS: CT HEAD FINDINGS Brain: Generalized atrophy. Normal ventricular morphology. No midline shift or mass effect. Enlarged empty sella. Otherwise normal appearance of brain parenchyma. No intracranial hemorrhage, mass lesion or evidence acute infarction. No extra-axial fluid collections. Cerebellar tonsillar ectopia better visualized on prior MR. Vascular: Mild atherosclerotic calcification at the internal carotid arteries at the skullbase Skull: Intact Sinuses/Orbits: Scattered mucosal thickening of the paranasal sinuses. Prior BILATERAL resections of the turbinates and the medial walls of the maxillary sinuses. No sinus air-fluid levels. Other: N/A CT CERVICAL SPINE FINDINGS Alignment: Normal alignment Skull base and vertebrae: Visualized skullbase intact. Prominent can S posterior to the odontoid process. Vertebral body heights maintained without fracture or bone destruction. Multilevel facet degenerative changes. Multilevel degenerative disc disease changes with disc space narrowing and endplate spur formation. Soft tissues and spinal canal: Prevertebral soft  tissues normal thickness. Remaining visualized cervical soft tissues unremarkable Disc levels: Multilevel disc space narrowing and calcified bulging disc. AP narrowing of spinal canal especially C4-C5 and C5-C6. Encroachment upon cervical neural foramina bilaterally at C5-C6 and C6-C7 by uncovertebral spurs. Upper chest: Lung apices clear Other: Atherosclerotic calcifications at the carotid bifurcations. IMPRESSION: No acute intracranial abnormalities. Empty sella. Advanced multilevel degenerative disc and facet disease changes of the cervical spine with levels of AP spinal stenosis and BILATERAL neural foraminal stenosis as above. No acute cervical spine abnormalities. Electronically Signed   By: Lavonia Dana M.D.   On: 10/08/2016 13:11     Procedures Procedures (including critical care time)  Medications Ordered in ED Medications  sodium chloride 0.9 % bolus 500 mL (0 mLs Intravenous Stopped 10/08/16 1451)     Initial Impression / Assessment and Plan / ED Course  I have reviewed the triage vital signs and the nursing notes.  Pertinent labs & imaging results that were available during my care of the patient were reviewed by me and considered in my medical decision making (see chart for details).     83 year old female who presents after syncopal episode. On arrival, she seems very somnolent and lethargic, but easily arouses to voice, answering questions appropriately, and without focal neurological deficits. Her vital signs are non-concerning.  Her EKG does not show ischemia or stigmata of arrhythmia. Her CT head and cervical spine from fall does not show any traumatic injuries. We'll obtain basic blood work and UA.    Blood work suggests mild acute kidney injury, likely dehydration. Patient has been very tired and fatigued while here in the emergency department, sleeping a lot in the ED. She is receiving IV fluids. Pending UA to evaluate for UTI. Chest x-ray visualized, and shows persistent infiltrate in the left lower lobe that could be suggestive of pneumonia. On chart review she was admitted for sepsis related to community acquired pneumonia with infiltrate at that time at the end of June. She is currently not having cough, fevers, congestion, and does not have any symptoms of pneumonia. We'll hold treatment at this time.  Discussed with hospitalist service, who will admit for ongoing management.  Final Clinical Impressions(s) / ED Diagnoses   Final diagnoses:  Syncope and collapse  Acute kidney injury [facility])    New Prescriptions New Prescriptions   No medications on file     Forde Dandy, MD 10/08/16 [phone]

## 2016-10-08 NOTE — ED Notes (Signed)
Attempted report x1. 

## 2016-10-08 NOTE — Progress Notes (Signed)
Niece came by, has concerns about patient being over medicated.

## 2016-10-08 NOTE — ED Triage Notes (Addendum)
PER [facility]: Pt had a syncopal episode at 10:15 this am while in the kitchen. Pt's friend picked pt up off the floor and placed in a chair.  Pt denies pain or hitting head.  Initial BP on scene 66/43, HR 80, NSR, CBG 189.  Pt given 500Ml on NS PTA. BP after fluid 118/76.

## 2016-10-08 NOTE — H&P (Signed)
History and Physical    [name]:[id number] DOB: [birth year] DOA: 10/08/2016  PCP: Seward Carol, MD Patient coming from: home  Chief Complaint: syncope  HPI: [name] is a very pleasant 83 y.o. female with medical history significant for asthma, diabetes, small bowel obstruction status post colectomy with colostomy, dementia, recent pneumonia presents to the emergency Department chief complaint syncope. Initial evaluation concerning for orthostatic hypotension in the setting of diuretics and possible infectious process.  Information is obtained from the patient and the chart and the daughters at the bedside noting that information from patient may be unreliable due to mild dementia. Patient has no memory of what happened. She is aware that she is in the hospital. Daughter reports she was walking to the kitchen this morning and she collapsed to the ground. Reportedly her boyfriend saw her. They  the chair EMS was called. Reportedly systolic blood pressure 66 and heart rate was 80. She was given 500 mL of normal saline by EMS. Patient denies any recent illness no headache dizziness nausea vomiting diarrhea. She denies chest pain palpitation shortness of breath. She does report an intermittent cough but states she is just getting over pneumonia. She denies abdominal pain diarrhea constipation melena bright red blood per rectum. She denies dysuria hematuria frequency or urgency. Granddaughter denies any unintentional weight loss.    ED Course: In the emergency department blood pressures controlled, she is afebrile with an oxygen saturation level 94% on 2 L.  Review of Systems: As per HPI otherwise all other systems reviewed and are negative.   Ambulatory Status: She ambulates with a walker has had no recent falls.  Past Medical History:  Diagnosis Date  . Asthma   . Dementia   . Diabetes mellitus     History reviewed. No pertinent surgical history.  Social History    Social History  . Marital status: Single    Spouse name: N/A  . Number of children: N/A  . Years of education: N/A   Occupational History  . Not on file.   Social History Main Topics  . Smoking status: Never Smoker  . Smokeless tobacco: Never Used  . Alcohol use No  . Drug use: Unknown  . Sexual activity: No   Other Topics Concern  . Not on file   Social History Narrative  . No narrative on file    Allergies  Allergen Reactions  . Aspirin Other (See Comments)    Triggers asthma    History reviewed. No pertinent family history. Family medical history reviewed noncontributory to the admission of this elderly lady  Prior to Admission medications   Medication Sig Start Date End Date Taking? Authorizing Provider  acetaminophen (TYLENOL) 325 MG tablet Take 650 mg by mouth every 6 (six) hours as needed for headache (pain).   Yes [provider]  albuterol (PROVENTIL HFA;VENTOLIN HFA) 108 (90 Base) MCG/ACT inhaler Inhale 1-2 puffs into the lungs every 4 (four) hours as needed for wheezing or shortness of breath.    Yes [provider]  alendronate (FOSAMAX) 70 MG tablet Take 70 mg by mouth every Saturday.    Yes [provider]  Calcium Citrate-Vitamin D (CALCIUM CITRATE + D) 315-250 MG-UNIT TABS Take 1 tablet by mouth daily.   Yes [provider]  donepezil (ARICEPT) 10 MG tablet Take 10 mg by mouth at bedtime.  02/24/15  Yes [provider]  furosemide (LASIX) 20 MG tablet Take 2 tablets (40 mg total) by mouth daily.  09/09/16  Yes Bonnielee Haff, MD  gabapentin (NEURONTIN) 300 MG capsule Take 300 mg by mouth 3 (three) times daily. 10/12/15  Yes [provider]  glipiZIDE (GLUCOTROL XL) 5 MG 24 hr tablet Take 5 mg by mouth 2 (two) times daily with a meal.    Yes [provider]  losartan (COZAAR) 100 MG tablet Take 100 mg by mouth daily. 09/22/16  Yes [provider]  memantine (NAMENDA XR) 28 MG CP24 24 hr  capsule Take 28 mg by mouth daily.   Yes [provider]  probenecid (BENEMID) 500 MG tablet Take 250 mg by mouth daily.    Yes [provider]  QUEtiapine (SEROQUEL) 25 MG tablet Take 1 tablet (25 mg total) by mouth 2 (two) times daily. 09/09/16  Yes Bonnielee Haff, MD    Physical Exam: Vitals:   10/08/16 1600 10/08/16 1615 10/08/16 1645 10/08/16 1650  BP: 115/72 123/72 126/73 126/73  Pulse:   72 76  Resp: 12 12 20 15   Temp:      TempSrc:      SpO2:   100% 100%  Weight:      Height:         General:  Appears calm Slightly lethargic but easily to arouse in no acute distress Eyes:  PERRL, EOMI, normal lids, iris ENT:  grossly normal hearing, lips & tongue, mucous membranes of her mouth are pink very dry Neck:  no LAD, masses or thyromegaly Cardiovascular:  RRR, no m/r/g. No LE edema.  Respiratory:  Normal effort respirations somewhat shallow. Breath sounds diminished throughout hear very faint end-expiratory wheezing no crackles Abdomen:  soft, ntnd, positive bowel sounds throughout colostomy left lower quadrant intact draining soft brown stool Skin:  no rash or induration seen on limited exam Musculoskeletal:  grossly normal tone BUE/BLE, good ROM, no bony abnormality Psychiatric:  grossly normal mood and affect, speech fluent and appropriate, AOx3 Neurologic:  CN 2-12 grossly intact, moves all extremities in coordinated fashion, sensation intact speech clear facial symmetry somewhat lethargic follows commands moves all extremities  Labs on Admission: I have personally reviewed following labs and imaging studies  CBC:  Recent Labs Lab 10/08/16 1500  WBC 8.7  NEUTROABS 6.4  HGB 12.6  HCT 38.9  MCV 95.3  PLT 941   Basic Metabolic Panel:  Recent Labs Lab 10/08/16 1500  NA 140  K 4.4  CL 104  CO2 24  GLUCOSE 146*  BUN 29*  CREATININE 1.75*  CALCIUM 8.8*   GFR: Estimated Creatinine Clearance: 24.5 mL/min (A) (by C-G formula based on SCr of 1.75  mg/dL (H)). Liver Function Tests:  Recent Labs Lab 10/08/16 1500  AST 18  ALT 10*  ALKPHOS 91  BILITOT 0.5  PROT 6.6  ALBUMIN 3.0*   No results for input(s): LIPASE, AMYLASE in the last 168 hours. No results for input(s): AMMONIA in the last 168 hours. Coagulation Profile: No results for input(s): INR, PROTIME in the last 168 hours. Cardiac Enzymes: No results for input(s): CKTOTAL, CKMB, CKMBINDEX, TROPONINI in the last 168 hours. BNP (last 3 results) No results for input(s): PROBNP in the last 8760 hours. HbA1C: No results for input(s): HGBA1C in the last 72 hours. CBG:  Recent Labs Lab 10/08/16 1518  GLUCAP 133*   Lipid Profile: No results for input(s): CHOL, HDL, LDLCALC, TRIG, CHOLHDL, LDLDIRECT in the last 72 hours. Thyroid Function Tests: No results for input(s): TSH, T4TOTAL, FREET4, T3FREE, THYROIDAB in the last 72 hours. Anemia Panel: No  results for input(s): VITAMINB12, FOLATE, FERRITIN, TIBC, IRON, RETICCTPCT in the last 72 hours. Urine analysis:    Component Value Date/Time   COLORURINE YELLOW 09/05/2016 0130   APPEARANCEUR HAZY (A) 09/05/2016 0130   LABSPEC 1.018 09/05/2016 0130   PHURINE 5.0 09/05/2016 0130   GLUCOSEU NEGATIVE 09/05/2016 0130   HGBUR SMALL (A) 09/05/2016 0130   BILIRUBINUR NEGATIVE 09/05/2016 0130   KETONESUR NEGATIVE 09/05/2016 0130   PROTEINUR NEGATIVE 09/05/2016 0130   UROBILINOGEN 1.0 01/09/2015 1620   NITRITE NEGATIVE 09/05/2016 0130   LEUKOCYTESUR NEGATIVE 09/05/2016 0130    Creatinine Clearance: Estimated Creatinine Clearance: 24.5 mL/min (A) (by C-G formula based on SCr of 1.75 mg/dL (H)).  Sepsis Labs: @LABRCNTIP (procalcitonin:4,lacticidven:4) )No results found for this or any previous visit (from the past 240 hour(s)).   Radiological Exams on Admission: Dg Chest 2 View  Result Date: 10/08/2016 CLINICAL DATA:  Syncope, weakness, fatigue and hypoxia. EXAM: CHEST  2 VIEW COMPARISON:  09/05/2016 FINDINGS:  Cardiomediastinal silhouette is normal. Mediastinal contours appear intact. There is no evidence of pneumothorax. Low lung volumes with persistent peribronchial opacity in the left lung base. Small pleural effusions cannot be excluded. Osseous structures are without acute abnormality. Soft tissues are grossly normal. IMPRESSION: Low lung volumes with persistent peribronchial opacity in the left lung base. Bilateral small pleural effusions cannot be excluded. Electronically Signed   By: Fidela Salisbury M.D.   On: 10/08/2016 16:01   Ct Head Wo Contrast  Result Date: 10/08/2016 CLINICAL DATA:  Syncopal episode, fell striking head, feels more sleepy, history type II diabetes mellitus, asthma EXAM: CT HEAD WITHOUT CONTRAST CT CERVICAL SPINE WITHOUT CONTRAST TECHNIQUE: Multidetector CT imaging of the head and cervical spine was performed following the standard protocol without intravenous contrast. Multiplanar CT image reconstructions of the cervical spine were also generated. COMPARISON:  09/05/2016 FINDINGS: CT HEAD FINDINGS Brain: Generalized atrophy. Normal ventricular morphology. No midline shift or mass effect. Enlarged empty sella. Otherwise normal appearance of brain parenchyma. No intracranial hemorrhage, mass lesion or evidence acute infarction. No extra-axial fluid collections. Cerebellar tonsillar ectopia better visualized on prior MR. Vascular: Mild atherosclerotic calcification at the internal carotid arteries at the skullbase Skull: Intact Sinuses/Orbits: Scattered mucosal thickening of the paranasal sinuses. Prior BILATERAL resections of the turbinates and the medial walls of the maxillary sinuses. No sinus air-fluid levels. Other: N/A CT CERVICAL SPINE FINDINGS Alignment: Normal alignment Skull base and vertebrae: Visualized skullbase intact. Prominent can S posterior to the odontoid process. Vertebral body heights maintained without fracture or bone destruction. Multilevel facet degenerative  changes. Multilevel degenerative disc disease changes with disc space narrowing and endplate spur formation. Soft tissues and spinal canal: Prevertebral soft tissues normal thickness. Remaining visualized cervical soft tissues unremarkable Disc levels: Multilevel disc space narrowing and calcified bulging disc. AP narrowing of spinal canal especially C4-C5 and C5-C6. Encroachment upon cervical neural foramina bilaterally at C5-C6 and C6-C7 by uncovertebral spurs. Upper chest: Lung apices clear Other: Atherosclerotic calcifications at the carotid bifurcations. IMPRESSION: No acute intracranial abnormalities. Empty sella. Advanced multilevel degenerative disc and facet disease changes of the cervical spine with levels of AP spinal stenosis and BILATERAL neural foraminal stenosis as above. No acute cervical spine abnormalities. Electronically Signed   By: Lavonia Dana M.D.   On: 10/08/2016 13:11   Ct Cervical Spine Wo Contrast  Result Date: 10/08/2016 CLINICAL DATA:  Syncopal episode, fell striking head, feels more sleepy, history type II diabetes mellitus, asthma EXAM: CT HEAD WITHOUT CONTRAST CT CERVICAL SPINE WITHOUT CONTRAST  TECHNIQUE: Multidetector CT imaging of the head and cervical spine was performed following the standard protocol without intravenous contrast. Multiplanar CT image reconstructions of the cervical spine were also generated. COMPARISON:  09/05/2016 FINDINGS: CT HEAD FINDINGS Brain: Generalized atrophy. Normal ventricular morphology. No midline shift or mass effect. Enlarged empty sella. Otherwise normal appearance of brain parenchyma. No intracranial hemorrhage, mass lesion or evidence acute infarction. No extra-axial fluid collections. Cerebellar tonsillar ectopia better visualized on prior MR. Vascular: Mild atherosclerotic calcification at the internal carotid arteries at the skullbase Skull: Intact Sinuses/Orbits: Scattered mucosal thickening of the paranasal sinuses. Prior BILATERAL  resections of the turbinates and the medial walls of the maxillary sinuses. No sinus air-fluid levels. Other: N/A CT CERVICAL SPINE FINDINGS Alignment: Normal alignment Skull base and vertebrae: Visualized skullbase intact. Prominent can S posterior to the odontoid process. Vertebral body heights maintained without fracture or bone destruction. Multilevel facet degenerative changes. Multilevel degenerative disc disease changes with disc space narrowing and endplate spur formation. Soft tissues and spinal canal: Prevertebral soft tissues normal thickness. Remaining visualized cervical soft tissues unremarkable Disc levels: Multilevel disc space narrowing and calcified bulging disc. AP narrowing of spinal canal especially C4-C5 and C5-C6. Encroachment upon cervical neural foramina bilaterally at C5-C6 and C6-C7 by uncovertebral spurs. Upper chest: Lung apices clear Other: Atherosclerotic calcifications at the carotid bifurcations. IMPRESSION: No acute intracranial abnormalities. Empty sella. Advanced multilevel degenerative disc and facet disease changes of the cervical spine with levels of AP spinal stenosis and BILATERAL neural foraminal stenosis as above. No acute cervical spine abnormalities. Electronically Signed   By: Lavonia Dana M.D.   On: 10/08/2016 13:11    EKG: Independently reviewed. Sinus rhythm Left anterior fascicular block similar to previous EKG   Assessment/Plan Principal Problem:   Syncope Active Problems:   IRON DEFIC ANEMIA SEC DIET IRON INTAKE   Diabetes mellitus type 2, controlled ([place])   Sepsis ([place])   Acute kidney injury ([place])   Abnormal chest x-ray   Lethargy   Dementia   #1. Syncope. Likely related to orthostatic hypotension in the setting of dehydration in a patient this home medications include Lasix, losartan, amlodipine. Recent echo reveals moderate LVH EF of 60% and grade 1 diastolic dysfunction. Chest x-ray with persistent peribronchial opacity in the left lung base,  urinalysis pending, EKG without acute changes,  CT of the head without acute abnormality, mild acute kidney injury otherwise no metabolic derangements. No orthostatic VS obtained prior to fluid bolus. She is afebrile no leukocytosis hemodynamically stable after fluid bolus.  -Admit to telemetry -Continue gentle IV fluids -Hold Lasix and losartan.  -resume amlodipine in am as BP improving on admission -orthostatic VS in am -follow procalcitonin   #2. Acute kidney injury. Likely related to dehydration secondary to decreased oral intake and Lasix. Creatinine 1.75 on admission -Gentle IV fluids as noted above -Hold nephrotoxins -Monitor urine output -Recheck in the morning  #3. Abnormal chest x-ray. Chest x-ray with persistent infiltrate left lower lung. X-rays examined and appeared to be improved compared to late June imaging. She is afebrile no leukocytosis. She's completed course of antibiotics as she was recently hospitalized with pneumonia. She does have an intermittent cough with a poor cough effort. Of note recent echo reveals an EF of 65%, moderate LVH grade 1 diastolic dysfunction -Obtain pro-calcitonin -Hold off on in a biotics for now -Nebulizers -Antitussive -Monitor oxygen saturation level -Mobilize  #4. Diabetes. Serum glucose 146. Home medications include oral agents. -Hold oral agents for now -Obtain  a hemoglobin A1c -Sliding scale insulin for optimal control  #5. Lethargy. Daughter states patient has slept the entire time she's been in the emergency department. Has been more alert and active prior to syncopal episode. CT of the head as noted above. Neuro exam with no focal deficits. He is easily aroused -IV fluids as noted above -Hold sedating medications -Reduced dosage on gabapentin -Obtain TSH B-12 folate -Physical therapy consult -Monitor  #6. Dementia. Appears stable at baseline. Home medications include Namenda and Aricept. -Hold Namenda and SSEPs for now  #7.  Hypertension. Hypotensive initially as noted in history of present illness. At the time of admission systolic blood pressure 561. Home medications include amlodipine Lasix losartan -Holding losartan and Lasix at the point of admission -Plan to resume amlodipine tomorrow -Monitor -Medications may need adjusting at discharge   DVT prophylaxis: lovenox  Code Status: full  Family Communication: daughter at bedside Disposition Plan: home  Consults called: none  Admission status: obs    Radene Gunning MD [facility]  If 7PM-7AM, please contact night-coverage [email or web address] Password [email or web address]  10/08/2016, 5:02 PM

## 2016-10-08 NOTE — Progress Notes (Signed)
Pt arrived to unit from ED via stretcher. Pt oriented x2/3 with some dementia and confusion and periods of drowsiness. Pt on 2L/[place]. VSS. Pt placed on tele-SR. Colostomy bag emptied and supplies at bedside. Pt incontinent of urine and Purewik placed. IV in R wrist, salien locked. No skin issues noted. Admission at bedside now completing history. Will continue to monitor pt closely. Leanne Chang, RN

## 2016-10-08 NOTE — ED Notes (Signed)
ED Provider at bedside. 

## 2016-10-09 DIAGNOSIS — N179 Acute kidney failure, unspecified: Secondary | ICD-10-CM | POA: Diagnosis not present

## 2016-10-09 LAB — BASIC METABOLIC PANEL
ANION GAP: 7 (ref 5–15)
BUN: 26 mg/dL — ABNORMAL HIGH (ref 6–20)
CO2: 25 mmol/L (ref 22–32)
Calcium: 8.3 mg/dL — ABNORMAL LOW (ref 8.9–10.3)
Chloride: 107 mmol/L (ref 101–111)
Creatinine, Ser: 1.38 mg/dL — ABNORMAL HIGH (ref 0.44–1.00)
GFR calc Af Amer: 40 mL/min — ABNORMAL LOW (ref >60)
GFR calc non Af Amer: 35 mL/min — ABNORMAL LOW (ref >60)
GLUCOSE: 266 mg/dL — AB (ref 65–99)
POTASSIUM: 4.7 mmol/L (ref 3.5–5.1)
Sodium: 139 mmol/L (ref 135–145)

## 2016-10-09 LAB — URINALYSIS, ROUTINE W REFLEX MICROSCOPIC
Bilirubin Urine: NEGATIVE
GLUCOSE, UA: NEGATIVE mg/dL
Ketones, ur: NEGATIVE mg/dL
Leukocytes, UA: NEGATIVE
Nitrite: NEGATIVE
PH: 5 (ref 5.0–8.0)
Protein, ur: NEGATIVE mg/dL
SPECIFIC GRAVITY, URINE: 1.01 (ref 1.005–1.030)

## 2016-10-09 LAB — CBC
HEMATOCRIT: 35.3 % — AB (ref 36.0–46.0)
HEMOGLOBIN: 11 g/dL — AB (ref 12.0–15.0)
MCH: 29.6 pg (ref 26.0–34.0)
MCHC: 31.2 g/dL (ref 30.0–36.0)
MCV: 95.1 fL (ref 78.0–100.0)
Platelets: 237 10*3/uL (ref 150–400)
RBC: 3.71 MIL/uL — ABNORMAL LOW (ref 3.87–5.11)
RDW: 14.1 % (ref 11.5–15.5)
WBC: 8.1 10*3/uL (ref 4.0–10.5)

## 2016-10-09 LAB — GLUCOSE, CAPILLARY
GLUCOSE-CAPILLARY: 211 mg/dL — AB (ref 65–99)
Glucose-Capillary: 183 mg/dL — ABNORMAL HIGH (ref 65–99)
Glucose-Capillary: 149 mg/dL — ABNORMAL HIGH (ref 65–99)

## 2016-10-09 LAB — FOLATE RBC
FOLATE, HEMOLYSATE: 370.5 ng/mL
Folate, RBC: 957 ng/mL (ref >498)
HEMATOCRIT: 38.7 % (ref 34.0–46.6)

## 2016-10-09 LAB — HEMOGLOBIN A1C
Hgb A1c MFr Bld: 7.3 % — ABNORMAL HIGH (ref 4.8–5.6)
MEAN PLASMA GLUCOSE: 163 mg/dL

## 2016-10-09 MED ORDER — ALBUTEROL SULFATE (2.5 MG/3ML) 0.083% IN NEBU: 2.5000 mg | INHALATION_SOLUTION | Freq: Two times a day (BID) | RESPIRATORY_TRACT | Status: DC

## 2016-10-09 NOTE — Clinical Social Work Note (Signed)
CSW spoke with patient's granddaughter over the phone. Inquired about interest in ALF. [name] declined due to help she is already receiving at home and the fact that she is there with her throughout the day. [name] stated she only had this "one spell with her blood pressure." CSW offered to put ALF list on her chart for her to take home with her if needed in the future. [name] is agreeable.  CSW signing off. Consult again if any other social work needs arise.  Dayton Scrape, [place]

## 2016-10-09 NOTE — Evaluation (Signed)
Physical Therapy Evaluation Patient Details Name: [name] MRN: [id number] DOB: 01/27/1934 Today's Date: 10/09/2016   History of Present Illness  83 yo admitted with syncope probably due to medication. PMhx: colostomy, dementia, asthma, DM  Clinical Impression  Pt pleasant and agreeable to mobilize with PT. Pt A&O x 2 (disoriented to time and situation). Pt does not require physical assistance for mobility and was able to walk safely in hallway with RW. Pt able to mobilize without O2 (SpO2 above 90% throughout). Pt presents with deficits listed in PT problem list below and will follow acutely for mobilization and increased activity tolerance. Pt safe to d/c home with recommendation of continued 24 hour supervision and assistance with ADLs.     Follow Up Recommendations Supervision/Assistance - 24 hour    Equipment Recommendations  None recommended by PT    Recommendations for Other Services       Precautions / Restrictions Precautions Precautions: Fall      Mobility  Bed Mobility Overal bed mobility: Modified Independent             General bed mobility comments: with rail   Transfers Overall transfer level: Needs assistance   Transfers: Sit to/from Stand Sit to Stand: Min guard         General transfer comment: cues for hand placement  Ambulation/Gait Ambulation/Gait assistance: Min guard Ambulation Distance (Feet): 150 Feet Assistive device: Rolling walker (2 wheeled) Gait Pattern/deviations: Step-through pattern;Trunk flexed;Decreased stride length Gait velocity: slow Gait velocity interpretation: Below normal speed for age/gender General Gait Details: Pt with good step through pattern and VCs for posture.   Stairs            Wheelchair Mobility    Modified Rankin (Stroke Patients Only)       Balance Overall balance assessment: Needs assistance   Sitting balance-Leahy Scale: Good       Standing balance-Leahy Scale: Fair                                Pertinent Vitals/Pain Pain Assessment: No/denies pain    Home Living Family/patient expects to be discharged to:: Private residence Living Arrangements: Other relatives Available Help at Discharge: Family;Available 24 hours/day Type of Home: House Home Access: Level entry     Home Layout: One level Home Equipment: Walker - 2 wheels;Shower seat      Prior Function Level of Independence: Needs assistance   Gait / Transfers Assistance Needed: walks with walker  ADL's / Homemaking Assistance Needed: supervision seated for bathing and dressing, family does homemaking  Comments: PLOF and history from pt unsure of accuracy, no family present     Hand Dominance        Extremity/Trunk Assessment   Upper Extremity Assessment Upper Extremity Assessment: Generalized weakness    Lower Extremity Assessment Lower Extremity Assessment: Generalized weakness    Cervical / Trunk Assessment Cervical / Trunk Assessment: Kyphotic  Communication   Communication: HOH  Cognition Arousal/Alertness: Awake/alert Behavior During Therapy: Flat affect Overall Cognitive Status: History of cognitive impairments - at baseline                                 General Comments: Pt disoriented to time and situation      General Comments      Exercises     Assessment/Plan    PT Assessment Patient needs  continued PT services  PT Problem List Decreased strength;Decreased mobility;Decreased activity tolerance;Decreased knowledge of use of DME;Decreased cognition       PT Treatment Interventions Gait training;Therapeutic exercise;Patient/family education;Functional mobility training;Balance training;DME instruction;Therapeutic activities    PT Goals (Current goals can be found in the Care Plan section)  Acute Rehab PT Goals Patient Stated Goal: go home PT Goal Formulation: With patient Time For Goal Achievement: 10/23/16 Potential to Achieve  Goals: Good    Frequency Min 2X/week   Barriers to discharge   family present end of session and confirmed 24 hr assist    Co-evaluation               AM-PAC PT "6 Clicks" Daily Activity  Outcome Measure Difficulty turning over in bed (including adjusting bedclothes, sheets and blankets)?: A Little Difficulty moving from lying on back to sitting on the side of the bed? : A Lot Difficulty sitting down on and standing up from a chair with arms (e.g., wheelchair, bedside commode, etc,.)?: A Little Help needed moving to and from a bed to chair (including a wheelchair)?: A Little Help needed walking in hospital room?: A Little Help needed climbing 3-5 steps with a railing? : A Little 6 Click Score: 17    End of Session Equipment Utilized During Treatment: Gait belt Activity Tolerance: Patient tolerated treatment well Patient left: in chair;with chair alarm set;with call bell/phone within reach Nurse Communication: Mobility status PT Visit Diagnosis: Difficulty in walking, not elsewhere classified (R26.2);Muscle weakness (generalized) (M62.81)    Time: 0539-7673 PT Time Calculation (min) (ACUTE ONLY): 25 min   Charges:   PT Evaluation $PT Eval Moderate Complexity: 1 Procedure     PT G Codes:   PT G-Codes **NOT FOR INPATIENT CLASS** Functional Assessment Tool Used: Clinical judgement Functional Limitation: Mobility: Walking and moving around Mobility: Walking and Moving Around Current Status (A1937): At least 20 percent but less than 40 percent impaired, limited or restricted Mobility: Walking and Moving Around Goal Status [phone]): At least 1 percent but less than 20 percent impaired, limited or restricted    [name], [place] [facility] [place] 10/09/2016, 10:32 AM

## 2016-10-09 NOTE — Care Management Note (Signed)
Case Management Note  Patient Details  Name: [name] MRN: [id number] Date of Birth: 28-Sep-1933  Subjective/Objective:  Syncope                 Action/Plan: Patient talked to granddaughter [name] via phone; patient is active with [facility] for [facility] services as prior to admission and they plan to take her home at discharge. PCP: Seward Carol, MD; has private insurance with [facility] with prescription drug coverage; DME- has rolling walker for home  Expected Discharge Date:   possibly 10/12/2016               Expected Discharge Plan:  [facility]  In-House Referral:  Clinical Social Work  Discharge planning Services  CM Consult  HH Arranged:  RN, PT, OT, Nurse's Aide [place] Agency:  [facility] (now [facility]  Status of Service:  In process, will continue to follow  Sherrilyn Rist [phone] 10/09/2016, 11:27 AM

## 2016-10-09 NOTE — Discharge Instructions (Signed)
Syncope Syncope is when you lose temporarily pass out (faint). Signs that you may be about to pass out include:  Feeling dizzy or light-headed.  Feeling sick to your stomach (nauseous).  Seeing all white or all black.  Having cold, clammy skin.  If you passed out, get help right away. Call your local emergency services (911 in the [place] Do not drive yourself to the hospital. Follow these instructions at home: Pay attention to any changes in your symptoms. Take these actions to help with your condition:  Have someone stay with you until you feel stable.  Do not drive, use machinery, or play sports until your doctor says it is okay.  Keep all follow-up visits as told by your doctor. This is important.  If you start to feel like you might pass out, lie down right away and raise (elevate) your feet above the level of your heart. Breathe deeply and steadily. Wait until all of the symptoms are gone.  Drink enough fluid to keep your pee (urine) clear or pale yellow.  If you are taking blood pressure or heart medicine, get up slowly and spend many minutes getting ready to sit and then stand. This can help with dizziness.  Take over-the-counter and prescription medicines only as told by your doctor.  Get help right away if:  You have a very bad headache.  You have unusual pain in your chest, tummy, or back.  You are bleeding from your mouth or rectum.  You have black or tarry poop (stool).  You have a very fast or uneven heartbeat (palpitations).  It hurts to breathe.  You pass out once or more than once.  You have jerky movements that you cannot control (seizure).  You are confused.  You have trouble walking.  You are very weak.  You have vision problems. These symptoms may be an emergency. Do not wait to see if the symptoms will go away. Get medical help right away. Call your local emergency services (911 in the [place] Do not drive yourself to the hospital. This  information is not intended to replace advice given to you by your health care provider. Make sure you discuss any questions you have with your health care provider. Document Released: 08/19/2007 Document Revised: 08/08/2015 Document Reviewed: 11/14/2014 Elsevier Interactive Patient Education  2018 Elsevier Inc[employer]  

## 2016-10-09 NOTE — Progress Notes (Signed)
Discharge instructions (including medications) discussed with and copy provided to patient/caregiver 

## 2016-10-09 NOTE — Discharge Summary (Signed)
[facility]  Physician Discharge Summary   Patient ID: [name] MRN: [id number] DOB/AGE: [id number] y.o.  Admit date: 10/08/2016 Discharge date: 10/09/2016  PCP: Seward Carol, MD  DISCHARGE DIAGNOSES:  Principal Problem:   Syncope Active Problems:   IRON DEFIC ANEMIA SEC DIET IRON INTAKE   Diabetes mellitus with complication (HCC)   Sepsis ([place])   Acute kidney injury ([place])   Abnormal chest x-ray   Lethargy   Dementia   RECOMMENDATIONS FOR OUTPATIENT FOLLOW UP: 1. Patient instructed to hold her Lasix, Cozaar, probenecid until she follows up with her PCP 2. Will benefit from blood work to check renal function at follow up   [place]: fair  Diet recommendation: As before. She's been asked to stay well hydrated for the next few days.  Filed Weights   10/08/16 1205 10/08/16 1828 10/09/16 0300  Weight: 71.7 kg (158 lb) 71.4 kg (157 lb 7 oz) 71.3 kg (157 lb 3.2 oz)    INITIAL HISTORY: 83 year old African-American female has medical history of diabetes, dementia, recent pneumonia, chronic diastolic CHF who presented after experiencing a syncopal episode at home. She was hospitalized for further management.   HOSPITAL COURSE:   Syncope, likely due to orthostatic hypotension. Patient was found to have elevated BUN and creatinine. These were significantly higher than her baseline. It is quite possible patient got dehydrated. She was noted to be on diuretics as well as ARB. She was taken off of these medications. She was gently hydrated. Renal function is improving. She is urinating. Orthostatics were repeated this morning were normal. She underwent echocardiogram recently, the report of which was reviewed. No arrhythmias noted on telemetry. Patient has ambulated with physical therapy. Home health will be resumed. Discussed with her sister in detail. Seems to be stable for discharge. We would recommend holding her diuretic as well as ARB for  now.  Acute renal failure Creatinine was 1.75 on admission. Creatinine is usually less than 1. BUN was also elevated. This is likely due to dehydration and diuretics. Lasix was held. She was gently hydrated. Renal function is improving. Creatinine is better this morning. She does not have any nausea, vomiting. She can continue to hydrate herself orally. Will benefit from follow-up with PCP early next week for repeat blood work.  Recent pneumonia. Chest x-ray was abnormal, but thought to be related to her recent episode of pneumonia from which she is recovering. No new symptoms. She has completed her course of antibiotics.  Diabetes mellitus type 2. Stable. May continue her home medications.  History of dementia. Stable. Continue home medications.  Essential hypertension. Holding ARB as mentioned above. It appears that she was taken off of amlodipine recently by her PCP. Blood pressures are reasonably well controlled.. Would recommend close follow-up with PCP.  Overall, stable. Improved. Okay for discharge home today.    PERTINENT LABS:  The results of significant diagnostics from this hospitalization (including imaging, microbiology, ancillary and laboratory) are listed below for reference.     Labs: Basic Metabolic Panel:  Recent Labs Lab 10/08/16 1500 10/09/16 0337  NA 140 139  K 4.4 4.7  CL 104 107  CO2 24 25  GLUCOSE 146* 266*  BUN 29* 26*  CREATININE 1.75* 1.38*  CALCIUM 8.8* 8.3*   Liver Function Tests:  Recent Labs Lab 10/08/16 1500  AST 18  ALT 10*  ALKPHOS 91  BILITOT 0.5  PROT 6.6  ALBUMIN 3.0*   CBC:  Recent Labs Lab 10/08/16 1500 10/09/16 2992  WBC 8.7 8.1  NEUTROABS 6.4  --   HGB 12.6 11.0*  HCT 38.9 35.3*  MCV 95.3 95.1  PLT 197 237   CBG:  Recent Labs Lab 10/08/16 1518 10/08/16 2141 10/09/16 0530 10/09/16 0757 10/09/16 1140  GLUCAP 133* 95 183* 149* 211*     IMAGING STUDIES Dg Chest 2 View  Result Date:  10/08/2016 CLINICAL DATA:  Syncope, weakness, fatigue and hypoxia. EXAM: CHEST  2 VIEW COMPARISON:  09/05/2016 FINDINGS: Cardiomediastinal silhouette is normal. Mediastinal contours appear intact. There is no evidence of pneumothorax. Low lung volumes with persistent peribronchial opacity in the left lung base. Small pleural effusions cannot be excluded. Osseous structures are without acute abnormality. Soft tissues are grossly normal. IMPRESSION: Low lung volumes with persistent peribronchial opacity in the left lung base. Bilateral small pleural effusions cannot be excluded. Electronically Signed   By: Fidela Salisbury M.D.   On: 10/08/2016 16:01   Ct Head Wo Contrast  Result Date: 10/08/2016 CLINICAL DATA:  Syncopal episode, fell striking head, feels more sleepy, history type II diabetes mellitus, asthma EXAM: CT HEAD WITHOUT CONTRAST CT CERVICAL SPINE WITHOUT CONTRAST TECHNIQUE: Multidetector CT imaging of the head and cervical spine was performed following the standard protocol without intravenous contrast. Multiplanar CT image reconstructions of the cervical spine were also generated. COMPARISON:  09/05/2016 FINDINGS: CT HEAD FINDINGS Brain: Generalized atrophy. Normal ventricular morphology. No midline shift or mass effect. Enlarged empty sella. Otherwise normal appearance of brain parenchyma. No intracranial hemorrhage, mass lesion or evidence acute infarction. No extra-axial fluid collections. Cerebellar tonsillar ectopia better visualized on prior MR. Vascular: Mild atherosclerotic calcification at the internal carotid arteries at the skullbase Skull: Intact Sinuses/Orbits: Scattered mucosal thickening of the paranasal sinuses. Prior BILATERAL resections of the turbinates and the medial walls of the maxillary sinuses. No sinus air-fluid levels. Other: N/A CT CERVICAL SPINE FINDINGS Alignment: Normal alignment Skull base and vertebrae: Visualized skullbase intact. Prominent can S posterior to the  odontoid process. Vertebral body heights maintained without fracture or bone destruction. Multilevel facet degenerative changes. Multilevel degenerative disc disease changes with disc space narrowing and endplate spur formation. Soft tissues and spinal canal: Prevertebral soft tissues normal thickness. Remaining visualized cervical soft tissues unremarkable Disc levels: Multilevel disc space narrowing and calcified bulging disc. AP narrowing of spinal canal especially C4-C5 and C5-C6. Encroachment upon cervical neural foramina bilaterally at C5-C6 and C6-C7 by uncovertebral spurs. Upper chest: Lung apices clear Other: Atherosclerotic calcifications at the carotid bifurcations. IMPRESSION: No acute intracranial abnormalities. Empty sella. Advanced multilevel degenerative disc and facet disease changes of the cervical spine with levels of AP spinal stenosis and BILATERAL neural foraminal stenosis as above. No acute cervical spine abnormalities. Electronically Signed   By: Lavonia Dana M.D.   On: 10/08/2016 13:11   Ct Cervical Spine Wo Contrast  Result Date: 10/08/2016 CLINICAL DATA:  Syncopal episode, fell striking head, feels more sleepy, history type II diabetes mellitus, asthma EXAM: CT HEAD WITHOUT CONTRAST CT CERVICAL SPINE WITHOUT CONTRAST TECHNIQUE: Multidetector CT imaging of the head and cervical spine was performed following the standard protocol without intravenous contrast. Multiplanar CT image reconstructions of the cervical spine were also generated. COMPARISON:  09/05/2016 FINDINGS: CT HEAD FINDINGS Brain: Generalized atrophy. Normal ventricular morphology. No midline shift or mass effect. Enlarged empty sella. Otherwise normal appearance of brain parenchyma. No intracranial hemorrhage, mass lesion or evidence acute infarction. No extra-axial fluid collections. Cerebellar tonsillar ectopia better visualized on prior MR. Vascular: Mild atherosclerotic calcification at the internal  carotid arteries at  the skullbase Skull: Intact Sinuses/Orbits: Scattered mucosal thickening of the paranasal sinuses. Prior BILATERAL resections of the turbinates and the medial walls of the maxillary sinuses. No sinus air-fluid levels. Other: N/A CT CERVICAL SPINE FINDINGS Alignment: Normal alignment Skull base and vertebrae: Visualized skullbase intact. Prominent can S posterior to the odontoid process. Vertebral body heights maintained without fracture or bone destruction. Multilevel facet degenerative changes. Multilevel degenerative disc disease changes with disc space narrowing and endplate spur formation. Soft tissues and spinal canal: Prevertebral soft tissues normal thickness. Remaining visualized cervical soft tissues unremarkable Disc levels: Multilevel disc space narrowing and calcified bulging disc. AP narrowing of spinal canal especially C4-C5 and C5-C6. Encroachment upon cervical neural foramina bilaterally at C5-C6 and C6-C7 by uncovertebral spurs. Upper chest: Lung apices clear Other: Atherosclerotic calcifications at the carotid bifurcations. IMPRESSION: No acute intracranial abnormalities. Empty sella. Advanced multilevel degenerative disc and facet disease changes of the cervical spine with levels of AP spinal stenosis and BILATERAL neural foraminal stenosis as above. No acute cervical spine abnormalities. Electronically Signed   By: Lavonia Dana M.D.   On: 10/08/2016 13:11    DISCHARGE EXAMINATION: Vitals:   10/09/16 0736 10/09/16 0743 10/09/16 1100 10/09/16 1445  BP:   (!) 123/53   Pulse: 81  86   Resp:   18   Temp:   98.6 F (37 C)   TempSrc:   Oral   SpO2: 92% 96% 98% 94%  Weight:      Height:       General appearance: alert, cooperative, appears stated age and no distress Resp: clear to auscultation bilaterally Cardio: regular rate and rhythm, S1, S2 normal, no murmur, click, rub or gallop GI: soft, non-tender; bowel sounds normal; no masses,  no organomegaly  DISPOSITION: Home with  family  Discharge Instructions    Call MD for:  difficulty breathing, headache or visual disturbances    Complete by:  As directed    Call MD for:  extreme fatigue    Complete by:  As directed    Call MD for:  persistant dizziness or light-headedness    Complete by:  As directed    Call MD for:  persistant nausea and vomiting    Complete by:  As directed    Call MD for:  severe uncontrolled pain    Complete by:  As directed    Call MD for:  temperature >100.4    Complete by:  As directed    Diet Carb Modified    Complete by:  As directed    Discharge instructions    Complete by:  As directed    Please stay well hydrated for the next few days. Please get up slowly from a sitting or lying position. Please note that I have recommended the following medications be held for now: Furosemide, Cozaar, probenecid. You will need to see your primary care provider early next week and they should advise you regarding resuming these medications. You may need to have blood work done to check your kidneys before they can make that recommendation.  You were cared for by a hospitalist during your hospital stay. If you have any questions about your discharge medications or the care you received while you were in the hospital after you are discharged, you can call the unit and asked to speak with the hospitalist on call if the hospitalist that took care of you is not available. Once you are discharged, your primary care physician will handle  any further medical issues. Please note that NO REFILLS for any discharge medications will be authorized once you are discharged, as it is imperative that you return to your primary care physician (or establish a relationship with a primary care physician if you do not have one) for your aftercare needs so that they can reassess your need for medications and monitor your lab values. If you do not have a primary care physician, you can call [phone] for a physician referral.    Increase activity slowly    Complete by:  As directed       ALLERGIES:  Allergies  Allergen Reactions  . Aspirin Other (See Comments)    Triggers asthma     Current Discharge Medication List    CONTINUE these medications which have NOT CHANGED   Details  acetaminophen (TYLENOL) 325 MG tablet Take 650 mg by mouth every 6 (six) hours as needed for headache (pain).    albuterol (PROVENTIL HFA;VENTOLIN HFA) 108 (90 Base) MCG/ACT inhaler Inhale 1-2 puffs into the lungs every 4 (four) hours as needed for wheezing or shortness of breath.     alendronate (FOSAMAX) 70 MG tablet Take 70 mg by mouth every Saturday.     Calcium Citrate-Vitamin D (CALCIUM CITRATE + D) 315-250 MG-UNIT TABS Take 1 tablet by mouth daily.    donepezil (ARICEPT) 10 MG tablet Take 10 mg by mouth at bedtime.  Refills: 1    gabapentin (NEURONTIN) 300 MG capsule Take 300 mg by mouth 3 (three) times daily. Refills: 1    glipiZIDE (GLUCOTROL XL) 5 MG 24 hr tablet Take 5 mg by mouth 2 (two) times daily with a meal.     memantine (NAMENDA XR) 28 MG CP24 24 hr capsule Take 28 mg by mouth daily.    QUEtiapine (SEROQUEL) 25 MG tablet Take 1 tablet (25 mg total) by mouth 2 (two) times daily. Qty: 60 tablet, Refills: 0      STOP taking these medications     furosemide (LASIX) 20 MG tablet      losartan (COZAAR) 100 MG tablet      probenecid (BENEMID) 500 MG tablet          Follow-up Information    Seward Carol, MD. Schedule an appointment as soon as possible for a visit on 10/14/2016.   Specialty:  Internal Medicine Why:  @10 :45am Contact information: [address] [place] [address] [phone]        [facility] Follow up.   Specialty:  [facility] Why:  They will continue to do your home health care at your home Contact information: [place] [address] [phone]           TOTAL DISCHARGE TIME: 77  mins  [facility] Pager [phone]  10/09/2016, 3:07 PM

## 2016-10-09 NOTE — Progress Notes (Signed)
   Introduced chaplaincy services.  Will follow, as needed.  

## 2016-11-08 ENCOUNTER — Emergency Department (HOSPITAL_COMMUNITY): Payer: Medicare Other

## 2016-11-08 ENCOUNTER — Observation Stay (HOSPITAL_COMMUNITY)
Admission: EM | Admit: 2016-11-08 | Discharge: 2016-11-09 | Disposition: A | Discharge status: Home / Self Care | Payer: Medicare Other | Attending: Family Medicine | Admitting: Family Medicine

## 2016-11-08 ENCOUNTER — Encounter (HOSPITAL_COMMUNITY): Payer: Self-pay

## 2016-11-08 DIAGNOSIS — E118 Type 2 diabetes mellitus with unspecified complications: Secondary | ICD-10-CM | POA: Diagnosis present

## 2016-11-08 DIAGNOSIS — R55 Syncope and collapse: Secondary | ICD-10-CM | POA: Diagnosis not present

## 2016-11-08 DIAGNOSIS — Z933 Colostomy status: Secondary | ICD-10-CM | POA: Insufficient documentation

## 2016-11-08 DIAGNOSIS — I951 Orthostatic hypotension: Principal | ICD-10-CM | POA: Insufficient documentation

## 2016-11-08 DIAGNOSIS — I959 Hypotension, unspecified: Secondary | ICD-10-CM | POA: Diagnosis present

## 2016-11-08 DIAGNOSIS — Z79899 Other long term (current) drug therapy: Secondary | ICD-10-CM | POA: Insufficient documentation

## 2016-11-08 DIAGNOSIS — I110 Hypertensive heart disease with heart failure: Secondary | ICD-10-CM | POA: Diagnosis not present

## 2016-11-08 DIAGNOSIS — F0391 Unspecified dementia with behavioral disturbance: Secondary | ICD-10-CM | POA: Diagnosis not present

## 2016-11-08 DIAGNOSIS — N179 Acute kidney failure, unspecified: Secondary | ICD-10-CM | POA: Insufficient documentation

## 2016-11-08 DIAGNOSIS — F0390 Unspecified dementia without behavioral disturbance: Secondary | ICD-10-CM | POA: Diagnosis present

## 2016-11-08 DIAGNOSIS — E119 Type 2 diabetes mellitus without complications: Secondary | ICD-10-CM | POA: Diagnosis not present

## 2016-11-08 DIAGNOSIS — I5032 Chronic diastolic (congestive) heart failure: Secondary | ICD-10-CM | POA: Diagnosis not present

## 2016-11-08 DIAGNOSIS — J45909 Unspecified asthma, uncomplicated: Secondary | ICD-10-CM | POA: Diagnosis not present

## 2016-11-08 DIAGNOSIS — E860 Dehydration: Secondary | ICD-10-CM

## 2016-11-08 DIAGNOSIS — Z7984 Long term (current) use of oral hypoglycemic drugs: Secondary | ICD-10-CM | POA: Diagnosis not present

## 2016-11-08 LAB — COMPREHENSIVE METABOLIC PANEL
ALK PHOS: 85 U/L (ref 38–126)
ALT: 10 U/L — AB (ref 14–54)
ANION GAP: 9 (ref 5–15)
AST: 20 U/L (ref 15–41)
Albumin: 3.2 g/dL — ABNORMAL LOW (ref 3.5–5.0)
BUN: 21 mg/dL — AB (ref 6–20)
CO2: 22 mmol/L (ref 22–32)
CREATININE: 1.18 mg/dL — AB (ref 0.44–1.00)
Calcium: 9 mg/dL (ref 8.9–10.3)
Chloride: 109 mmol/L (ref 101–111)
GFR, EST AFRICAN AMERICAN: 49 mL/min — AB (ref >60)
GFR, EST NON AFRICAN AMERICAN: 42 mL/min — AB (ref >60)
Glucose, Bld: 187 mg/dL — ABNORMAL HIGH (ref 65–99)
Potassium: 5.1 mmol/L (ref 3.5–5.1)
Sodium: 140 mmol/L (ref 135–145)
Total Bilirubin: 0.6 mg/dL (ref 0.3–1.2)
Total Protein: 6.4 g/dL — ABNORMAL LOW (ref 6.5–8.1)

## 2016-11-08 LAB — GLUCOSE, CAPILLARY
GLUCOSE-CAPILLARY: 127 mg/dL — AB (ref 65–99)
GLUCOSE-CAPILLARY: 107 mg/dL — AB (ref 65–99)
GLUCOSE-CAPILLARY: 80 mg/dL (ref 65–99)
Glucose-Capillary: 181 mg/dL — ABNORMAL HIGH (ref 65–99)
Glucose-Capillary: 105 mg/dL — ABNORMAL HIGH (ref 65–99)

## 2016-11-08 LAB — CBC
HEMATOCRIT: 38.1 % (ref 36.0–46.0)
Hemoglobin: 12.4 g/dL (ref 12.0–15.0)
MCH: 30.8 pg (ref 26.0–34.0)
MCHC: 32.5 g/dL (ref 30.0–36.0)
MCV: 94.8 fL (ref 78.0–100.0)
PLATELETS: 172 10*3/uL (ref 150–400)
RBC: 4.02 MIL/uL (ref 3.87–5.11)
RDW: 14.1 % (ref 11.5–15.5)
WBC: 6.8 10*3/uL (ref 4.0–10.5)

## 2016-11-08 LAB — CBG MONITORING, ED: GLUCOSE-CAPILLARY: 151 mg/dL — AB (ref 65–99)

## 2016-11-08 LAB — I-STAT TROPONIN, ED: TROPONIN I, POC: 0 ng/mL (ref 0.00–0.08)

## 2016-11-08 LAB — TROPONIN I: Troponin I: <0.03 ng/mL (ref <0.03)

## 2016-11-08 LAB — URINALYSIS, ROUTINE W REFLEX MICROSCOPIC
BILIRUBIN URINE: NEGATIVE
Glucose, UA: NEGATIVE mg/dL
HGB URINE DIPSTICK: NEGATIVE
KETONES UR: NEGATIVE mg/dL
Leukocytes, UA: NEGATIVE
Nitrite: NEGATIVE
PH: 5 (ref 5.0–8.0)
Protein, ur: NEGATIVE mg/dL
SPECIFIC GRAVITY, URINE: 1.017 (ref 1.005–1.030)

## 2016-11-08 LAB — CK: Total CK: 76 U/L (ref 38–234)

## 2016-11-08 LAB — I-STAT CG4 LACTIC ACID, ED: Lactic Acid, Venous: 1.76 mmol/L (ref 0.5–1.9)

## 2016-11-08 MED ORDER — GABAPENTIN 300 MG PO CAPS
300.0000 mg | ORAL_CAPSULE | Freq: Three times a day (TID) | ORAL | Status: DC
  Administered 2016-11-08 – 2016-11-09 (×4): 300 mg via ORAL
  Filled 2016-11-08 (×4): qty 1

## 2016-11-08 MED ORDER — DONEPEZIL HCL 10 MG PO TABS
10.0000 mg | ORAL_TABLET | Freq: Every day | ORAL | Status: DC
  Administered 2016-11-09: 10 mg via ORAL
  Filled 2016-11-08: qty 1

## 2016-11-08 MED ORDER — INSULIN ASPART 100 UNIT/ML ~~LOC~~ SOLN
0.0000 [IU] | SUBCUTANEOUS | Status: DC
  Administered 2016-11-08: 1 [IU] via SUBCUTANEOUS
  Administered 2016-11-08: 2 [IU] via SUBCUTANEOUS
  Administered 2016-11-09: 5 [IU] via SUBCUTANEOUS
  Administered 2016-11-09: 1 [IU] via SUBCUTANEOUS

## 2016-11-08 MED ORDER — SODIUM CHLORIDE 0.9 % IV BOLUS (SEPSIS)
1000.0000 mL | Freq: Once | INTRAVENOUS | Status: AC
  Administered 2016-11-08: 1000 mL via INTRAVENOUS

## 2016-11-08 MED ORDER — QUETIAPINE FUMARATE 25 MG PO TABS
25.0000 mg | ORAL_TABLET | Freq: Two times a day (BID) | ORAL | Status: DC
  Administered 2016-11-08 – 2016-11-09 (×3): 25 mg via ORAL
  Filled 2016-11-08 (×3): qty 1

## 2016-11-08 MED ORDER — ENOXAPARIN SODIUM 40 MG/0.4ML ~~LOC~~ SOLN
40.0000 mg | SUBCUTANEOUS | Status: DC
  Administered 2016-11-08: 40 mg via SUBCUTANEOUS
  Filled 2016-11-08: qty 0.4

## 2016-11-08 MED ORDER — SODIUM CHLORIDE 0.9 % IV SOLN
INTRAVENOUS | Status: DC
  Administered 2016-11-08 – 2016-11-09 (×2): via INTRAVENOUS

## 2016-11-08 MED ORDER — MEMANTINE HCL ER 28 MG PO CP24
28.0000 mg | ORAL_CAPSULE | Freq: Every day | ORAL | Status: DC
  Administered 2016-11-08 – 2016-11-09 (×2): 28 mg via ORAL
  Filled 2016-11-08 (×2): qty 1

## 2016-11-08 MED ORDER — ACETAMINOPHEN 325 MG PO TABS: 650.0000 mg | ORAL_TABLET | Freq: Four times a day (QID) | ORAL | Status: DC | PRN

## 2016-11-08 MED ORDER — CALCIUM CARBONATE-VITAMIN D 500-200 MG-UNIT PO TABS
1.0000 | ORAL_TABLET | Freq: Every day | ORAL | Status: DC
  Administered 2016-11-08 – 2016-11-09 (×2): 1 via ORAL
  Filled 2016-11-08 (×2): qty 1

## 2016-11-08 MED ORDER — ALBUTEROL SULFATE (2.5 MG/3ML) 0.083% IN NEBU: 3.0000 mL | INHALATION_SOLUTION | RESPIRATORY_TRACT | Status: DC | PRN

## 2016-11-08 MED ORDER — SODIUM CHLORIDE 0.9% FLUSH
3.0000 mL | Freq: Two times a day (BID) | INTRAVENOUS | Status: DC
  Administered 2016-11-08 – 2016-11-09 (×3): 3 mL via INTRAVENOUS

## 2016-11-08 MED ORDER — HYDRALAZINE HCL 20 MG/ML IJ SOLN: 10.0000 mg | INTRAMUSCULAR | Status: DC | PRN

## 2016-11-08 NOTE — ED Provider Notes (Signed)
TIME SEEN: 1:09 AM  CHIEF COMPLAINT: Hypotension  HPI: Patient is an 83 year old female with history of hypertension, diabetes, dementia, colostomy who presents to the emergency department after she was found down on the ground by her daughter confused and had removed her colostomy bag. Patient is unable to provide many details given her history of dementia. Was found to be hypotensive with EMS. Was just recently admitted for syncope. She denies chest pain or shortness of breath. Denies abdominal pain. No known vomiting or diarrhea. No numbness or weakness on exam.  ROS: Level V caveat for dementia  PAST MEDICAL HISTORY/PAST SURGICAL HISTORY:  Past Medical History:  Diagnosis Date  . Asthma   . Colostomy present ([place])    hx/notes 07/17/2010  . Dementia   . Hypertension    hx/notes 07/17/2010  . Pneumonia    recent/notes 10/08/2016  . Type II diabetes mellitus ([place])    hx/notes 07/17/2010    MEDICATIONS:  Prior to Admission medications   Medication Sig Start Date End Date Taking? Authorizing Provider  acetaminophen (TYLENOL) 325 MG tablet Take 650 mg by mouth every 6 (six) hours as needed for headache (pain).    [provider]  albuterol (PROVENTIL HFA;VENTOLIN HFA) 108 (90 Base) MCG/ACT inhaler Inhale 1-2 puffs into the lungs every 4 (four) hours as needed for wheezing or shortness of breath.     [provider]  alendronate (FOSAMAX) 70 MG tablet Take 70 mg by mouth every Saturday.     [provider]  Calcium Citrate-Vitamin D (CALCIUM CITRATE + D) 315-250 MG-UNIT TABS Take 1 tablet by mouth daily.    [provider]  donepezil (ARICEPT) 10 MG tablet Take 10 mg by mouth at bedtime.  02/24/15   [provider]  gabapentin (NEURONTIN) 300 MG capsule Take 300 mg by mouth 3 (three) times daily. 10/12/15   [provider]  glipiZIDE (GLUCOTROL XL) 5 MG 24 hr tablet Take 5 mg by mouth 2 (two) times daily with a meal.     [provider]  memantine (NAMENDA XR) 28 MG CP24 24 hr capsule Take 28 mg by mouth daily.    [provider]  QUEtiapine (SEROQUEL) 25 MG tablet Take 1 tablet (25 mg total) by mouth 2 (two) times daily. 09/09/16   Bonnielee Haff, MD    ALLERGIES:  Allergies  Allergen Reactions  . Aspirin Other (See Comments)    Triggers asthma    SOCIAL HISTORY:  Social History  Substance Use Topics  . Smoking status: Never Smoker  . Smokeless tobacco: Current User    Types: Chew  . Alcohol use No    FAMILY HISTORY: No family history on file.  EXAM: BP 124/70 (BP Location: Right Arm)   Pulse 63   Temp (!) 97.5 F (36.4 C) (Oral)   Resp 12   Wt 71.2 kg (157 lb)   SpO2 97%   BMI 26.95 kg/m  CONSTITUTIONAL: Alert and oriented To person and place but not time and responds appropriately to questions. Well-appearing; well-nourished; GCS 25, elderly, demented, no distress HEAD: Normocephalic; atraumatic EYES: Conjunctivae clear, PERRL, EOMI ENT: normal nose; no rhinorrhea; moist mucous membranes; pharynx without lesions noted; no dental injury; no septal hematoma NECK: Supple, no meningismus, no LAD; no midline spinal tenderness, step-off or deformity; trachea midline CARD: RRR; S1 and S2 appreciated; no murmurs, no clicks, no rubs, no gallops RESP: Normal chest excursion without splinting or tachypnea; breath sounds clear and equal bilaterally; no wheezes,  no rhonchi, no rales; no hypoxia or respiratory distress CHEST:  chest wall stable, no crepitus or ecchymosis or deformity, nontender to palpation; no flail chest ABD/GI: Normal bowel sounds; non-distended; soft, non-tender, no rebound, no guarding; no ecchymosis or other lesions noted PELVIS:  stable, nontender to palpation BACK:  The back appears normal and is non-tender to palpation, there is no CVA tenderness; no midline spinal tenderness, step-off or deformity EXT: Normal ROM in all joints; non-tender to palpation; no edema; normal  capillary refill; no cyanosis, no bony tenderness or bony deformity of patient's extremities, no joint effusion, compartments are soft, extremities are warm and well-perfused, no ecchymosis SKIN: Normal color for age and race; warm NEURO: Moves all extremities equally, no pronator drift, sensation to light touch intact diffusely, cranial nerves II through XII intact, normal speech PSYCH: The patient's mood and manner are appropriate. Grooming and personal hygiene are appropriate.  MEDICAL DECISION MAKING: Patient here after a fall, possible syncope. Was found to be hypotensive with EMS. Appears to be at her neurologic baseline now. No sign of trauma on exam. Will check labs, rectal temperature, lactate, cardiac labs, urine. Will obtain a CT of her head and cervical spine given history of fall. We'll give IV hydration given hypotension.  ED PROGRESS: Patient's labs show mild AKI consistent with dehydration likely as the cause of her hypotension. Otherwise workup has been completely unremarkable including normal CK, troponin, lactate. Urine shows no sign of infection. Chest x-ray clear. Head and cervical spine CT showed no acute abnormality. We'll continue hydration but have recommended observation admission. Will discuss with hospitalist.   3:37 AM Discussed patient's case with hospitalist, Dr. Alcario Drought.  I have recommended admission and patient (and family if present) agree with this plan. Admitting physician will place admission orders.   I reviewed all nursing notes, vitals, pertinent previous records, EKGs, lab and urine results, imaging (as available).      EKG Interpretation  Date/Time:  Sunday November 08 2016 00:25:39 EDT Ventricular Rate:  65 PR Interval:    QRS Duration: 103 QT Interval:  408 QTC Calculation: 425 R Axis:   -39 Text Interpretation:  Sinus rhythm Left axis deviation Abnormal R-wave progression, late transition Baseline wander in lead(s) V2 Confirmed by Lakisha Peyser, Cyril Mourning  [phone]) on 11/08/2016 1:10:05 AM         Magdiel Bartles, Delice Bison, DO 11/08/16 6045

## 2016-11-08 NOTE — Progress Notes (Addendum)
PROGRESS NOTE  Subjective: [name] is an 83 y.o. female with a history of dementia, T2DM, HTN, chronic HFpEF and recent admission for syncope who presented earlier this morning after being found in the bathroom floor confused. On arrival she was hypotensive, improving with IVF's, and had +orthostatic vital signs. She has been brought in for observation, ARB and lasix stopped due to syncope related to hypovolemia.   Objective: BP (!) 162/64 (BP Location: Right Arm)   Pulse 84   Temp 97.6 F (36.4 C) (Oral)   Resp 18   Ht 4\' 11"  (1.499 m)   Wt 71 kg (156 lb 8.4 oz) Comment: scale a  SpO2 100%   BMI 31.61 kg/m   Gen: Pleasant, calm elderly female Pulm: Clear and nonlabored on room air  CV: RRR, no murmur, no JVD, trace pitting edema this AM, now 1+.  GI: Soft, NT, ND, +BS. Colostomy on left abdomen with healthy stoma, no stool in new bag. Neuro: Alert, without focal deficits. Skin: No rashes, lesions no ulcers  Assessment & Plan: Recurrent syncope/orthostatic hypotension: Suspect hypovolemia after ARB and prn lasix restarted. This had been held on discharge from recent admission for same.  - Hold ARB and lasix, IVF's started. Will continue these until taking po reliably, monitoring BP and volume status closely. Would be willing to tolerate HTN that is not in severe range in this elderly female with frequent syncope and orthostasis.  - Continue telemetry, no events noted thus far or during recent DC.  - No need to repeat echocardiogram - PT eval and treat, previously had home health - Repeat orthostatic vital signs  Chronic diastolic CHF: Echocardiogram from 10/01/2016 report reviewed, 65-70% EF, mod LVH, G1DD, no regional WMA's. No significant valvular abnormalities.  - Monitor volume status with weights, I/O. Legs edematous this PM. Will stop IVF's.   AKI: SCr 1.18 on admission with premorbid baseline 0.7-0.8 (also had AKI during hospitalization 7/26-7/27).  - Got IVF's, hold  ARB/diuretic as above.  - Recheck BMP in AM  T2DM: Well controlled with HbA1c 7.3% in 83yo.  - Hold glipizide - Carb-modified diet and SSI while hospitalized.    Dementia with behavioral disturbance: Chronic, stable.  - Delirium precautions - Continue home aricept, namenda, and seroquel.  Vance Gather, MD [facility] Pager [phone] 11/08/2016, 9:14 AM

## 2016-11-08 NOTE — ED Triage Notes (Signed)
Pt from home with ems c.o AMS. Per family the pt was in the bathroom for a long period of time, granddaughter found her on the floor with her colostomy bag off, confused and clammy. Pt alert and orientedx3, disoriented to situation at this time. Denies any pain. Pt intially hypotensive at 70/40 after 361ml of fluid pt 100/60. Hr 60 sinus rhythm. CBG 159, nad at this time 22R hand

## 2016-11-08 NOTE — Progress Notes (Signed)
Orthostatic vital signs completed except standing for 3 minutes due to patient unable to stand that long and being weak.  Will continue to monitor.  Aadvik Roker, Therapist, sports   .

## 2016-11-08 NOTE — H&P (Signed)
History and Physical    SOLEDAD BUDREAU SHF:[id number] DOB: 1933-12-21 DOA: 11/08/2016  PCP: Seward Carol, MD  Patient coming from: Home  I have personally briefly reviewed patient's old medical records in [place]  Chief Complaint: Found down in bathroom (syncope)  HPI: [name] is a 83 y.o. female with medical history significant of Dementia, HTN, DM2.  Patient just admitted last month for syncope due to orthostatic hypotension.  Taken off of ARB and lasix that admit.  Today found in bathroom by granddaughter confused.  Initially hypotensive with SBP 70s, improved after 300cc IVF.   ED Course: CBG 159, given 1L bolus.  Remainder of work up unremarkable.   Review of Systems: As per HPI otherwise 10 point review of systems negative.   Past Medical History:  Diagnosis Date  . Asthma   . Colostomy present ([place])    hx/notes 07/17/2010  . Dementia   . Hypertension    hx/notes 07/17/2010  . Pneumonia    recent/notes 10/08/2016  . Type II diabetes mellitus ([place])    hx/notes 07/17/2010    Past Surgical History:  Procedure Laterality Date  . ABDOMINAL HYSTERECTOMY    . BACK SURGERY    . COLOSTOMY     S/P SBO hx/notes 07/17/2010  . LUMBAR FUSION  11/2006   hx/notes 07/17/2010  . NASAL SINUS SURGERY  04/28/2002   Bilateral inferior turbinate reductions, bilateral maxillary antrotomies, bilateral total ethmoidectomies, bilateral frontal recess explorations, bilateral sphenoidotomies, [employer] guidance./notes 07/28/2010     reports that she has never smoked. Her smokeless tobacco use includes Chew. She reports that she does not drink alcohol or use drugs.  Allergies  Allergen Reactions  . Aspirin Other (See Comments)    Triggers asthma    No family history on file.   Prior to Admission medications   Medication Sig Start Date End Date Taking? Authorizing Provider  acetaminophen (TYLENOL) 325 MG tablet Take 650 mg by mouth every 6 (six) hours as needed for headache  (pain).    [provider]  albuterol (PROVENTIL HFA;VENTOLIN HFA) 108 (90 Base) MCG/ACT inhaler Inhale 1-2 puffs into the lungs every 4 (four) hours as needed for wheezing or shortness of breath.     [provider]  alendronate (FOSAMAX) 70 MG tablet Take 70 mg by mouth every Saturday.     [provider]  Calcium Citrate-Vitamin D (CALCIUM CITRATE + D) 315-250 MG-UNIT TABS Take 1 tablet by mouth daily.    [provider]  donepezil (ARICEPT) 10 MG tablet Take 10 mg by mouth at bedtime.  02/24/15   [provider]  gabapentin (NEURONTIN) 300 MG capsule Take 300 mg by mouth 3 (three) times daily. 10/12/15   [provider]  glipiZIDE (GLUCOTROL XL) 5 MG 24 hr tablet Take 5 mg by mouth 2 (two) times daily with a meal.     [provider]  memantine (NAMENDA XR) 28 MG CP24 24 hr capsule Take 28 mg by mouth daily.    [provider]  QUEtiapine (SEROQUEL) 25 MG tablet Take 1 tablet (25 mg total) by mouth 2 (two) times daily. 09/09/16   Bonnielee Haff, MD    Physical Exam: Vitals:   11/08/16 0200 11/08/16 0223 11/08/16 0230 11/08/16 0330  BP: (!) 149/75  139/73 123/77  Pulse: 61  68 67  Resp: 14  14 14   Temp:  (!) 96.7 F (35.9 C)    TempSrc:  Rectal    SpO2: 100%  100% 100%  Weight:        Constitutional: NAD, calm, comfortable Eyes: PERRL, lids and conjunctivae normal ENMT: Mucous membranes are moist. Posterior pharynx clear of any exudate or lesions.Normal dentition.  Neck: normal, supple, no masses, no thyromegaly Respiratory: clear to auscultation bilaterally, no wheezing, no crackles. Normal respiratory effort. No accessory muscle use.  Cardiovascular: Regular rate and rhythm, no murmurs / rubs / gallops. No extremity edema. 2+ pedal pulses. No carotid bruits.  Abdomen: no tenderness, no masses palpated. No hepatosplenomegaly. Bowel sounds positive.  Musculoskeletal: no clubbing / cyanosis. No joint deformity  upper and lower extremities. Good ROM, no contractures. Normal muscle tone.  Skin: no rashes, lesions, ulcers. No induration Neurologic: CN 2-12 grossly intact. Sensation intact, DTR normal. Strength 5/5 in all 4.  Psychiatric: Normal judgment and insight. Alert and oriented x 3. Normal mood.    Labs on Admission: I have personally reviewed following labs and imaging studies  CBC:  Recent Labs Lab 11/08/16 0209  WBC 6.8  HGB 12.4  HCT 38.1  MCV 94.8  PLT 160   Basic Metabolic Panel:  Recent Labs Lab 11/08/16 0209  NA 140  K 5.1  CL 109  CO2 22  GLUCOSE 187*  BUN 21*  CREATININE 1.18*  CALCIUM 9.0   GFR: Estimated Creatinine Clearance: 36.2 mL/min (A) (by C-G formula based on SCr of 1.18 mg/dL (H)). Liver Function Tests:  Recent Labs Lab 11/08/16 0209  AST 20  ALT 10*  ALKPHOS 85  BILITOT 0.6  PROT 6.4*  ALBUMIN 3.2*   No results for input(s): LIPASE, AMYLASE in the last 168 hours. No results for input(s): AMMONIA in the last 168 hours. Coagulation Profile: No results for input(s): INR, PROTIME in the last 168 hours. Cardiac Enzymes:  Recent Labs Lab 11/08/16 0210  CKTOTAL 76   BNP (last 3 results) No results for input(s): PROBNP in the last 8760 hours. HbA1C: No results for input(s): HGBA1C in the last 72 hours. CBG:  Recent Labs Lab 11/08/16 0202  GLUCAP 151*   Lipid Profile: No results for input(s): CHOL, HDL, LDLCALC, TRIG, CHOLHDL, LDLDIRECT in the last 72 hours. Thyroid Function Tests: No results for input(s): TSH, T4TOTAL, FREET4, T3FREE, THYROIDAB in the last 72 hours. Anemia Panel: No results for input(s): VITAMINB12, FOLATE, FERRITIN, TIBC, IRON, RETICCTPCT in the last 72 hours. Urine analysis:    Component Value Date/Time   COLORURINE YELLOW 11/08/2016 0220   APPEARANCEUR CLEAR 11/08/2016 0220   LABSPEC 1.017 11/08/2016 0220   PHURINE 5.0 11/08/2016 0220   GLUCOSEU NEGATIVE 11/08/2016 0220   HGBUR NEGATIVE 11/08/2016 0220    BILIRUBINUR NEGATIVE 11/08/2016 0220   KETONESUR NEGATIVE 11/08/2016 0220   PROTEINUR NEGATIVE 11/08/2016 0220   UROBILINOGEN 1.0 01/09/2015 1620   NITRITE NEGATIVE 11/08/2016 0220   LEUKOCYTESUR NEGATIVE 11/08/2016 0220    Radiological Exams on Admission: Ct Head Wo Contrast  Result Date: 11/08/2016 CLINICAL DATA:  Head trauma, minor, GCS>=13, high clinical risk, initial exam; C-spine trauma, high clinical risk (NEXUS/CCR). Found in the floor in the bathroom. Altered mental status. EXAM: CT HEAD WITHOUT CONTRAST CT CERVICAL SPINE WITHOUT CONTRAST TECHNIQUE: Multidetector CT imaging of the head and cervical spine was performed following the standard protocol without intravenous contrast. Multiplanar CT image reconstructions of the cervical spine were also generated. COMPARISON:  CTA 10/08/2016, brain MRI 09/05/2016 FINDINGS: CT HEAD FINDINGS Brain: Stable degree of atrophy and chronic small vessel ischemia. No intracranial hemorrhage, mass effect, or midline shift. No hydrocephalus. The  basilar cisterns are patent. MD sella and cerebellar tonsillar ectopia, stable from prior exams. No evidence of territorial infarct or acute ischemia. No extra-axial or intracranial fluid collection. Vascular: No hyperdense vessel or unexpected calcification. Skull: No skull fracture.  No focal lesion. Sinuses/Orbits: Chronic paranasal sinus inflammation, similar to prior exam. Prior sinus surgery. No new sinus fluid levels. Persistent non opacification of lower right mastoid air cells. Chronic calcifications in the region of both optic nerves, stable. Other: None. CT CERVICAL SPINE FINDINGS Alignment: Normal alignment. Skull base and vertebrae: No acute fracture. Vertebral body heights are maintained. The dens and skull base are intact. Pannus about the odontoid) is unchanged. Soft tissues and spinal canal: No prevertebral fluid or swelling. No visible canal hematoma. Disc levels: Diffuse disc space narrowing and endplate  spurring. Multilevel facet arthropathy, some which is ossified. Upper chest: No acute abnormality. Other: Carotid calcifications. IMPRESSION: 1. No acute intracranial abnormality.  No skull fracture. 2. No acute fracture or subluxation of the cervical spine. 3. Chronic changes in the head and cervical spine are stable from prior exam. Electronically Signed   By: Jeb Levering M.D.   On: 11/08/2016 02:04   Ct Cervical Spine Wo Contrast  Result Date: 11/08/2016 CLINICAL DATA:  Head trauma, minor, GCS>=13, high clinical risk, initial exam; C-spine trauma, high clinical risk (NEXUS/CCR). Found in the floor in the bathroom. Altered mental status. EXAM: CT HEAD WITHOUT CONTRAST CT CERVICAL SPINE WITHOUT CONTRAST TECHNIQUE: Multidetector CT imaging of the head and cervical spine was performed following the standard protocol without intravenous contrast. Multiplanar CT image reconstructions of the cervical spine were also generated. COMPARISON:  CTA 10/08/2016, brain MRI 09/05/2016 FINDINGS: CT HEAD FINDINGS Brain: Stable degree of atrophy and chronic small vessel ischemia. No intracranial hemorrhage, mass effect, or midline shift. No hydrocephalus. The basilar cisterns are patent. MD sella and cerebellar tonsillar ectopia, stable from prior exams. No evidence of territorial infarct or acute ischemia. No extra-axial or intracranial fluid collection. Vascular: No hyperdense vessel or unexpected calcification. Skull: No skull fracture.  No focal lesion. Sinuses/Orbits: Chronic paranasal sinus inflammation, similar to prior exam. Prior sinus surgery. No new sinus fluid levels. Persistent non opacification of lower right mastoid air cells. Chronic calcifications in the region of both optic nerves, stable. Other: None. CT CERVICAL SPINE FINDINGS Alignment: Normal alignment. Skull base and vertebrae: No acute fracture. Vertebral body heights are maintained. The dens and skull base are intact. Pannus about the odontoid) is  unchanged. Soft tissues and spinal canal: No prevertebral fluid or swelling. No visible canal hematoma. Disc levels: Diffuse disc space narrowing and endplate spurring. Multilevel facet arthropathy, some which is ossified. Upper chest: No acute abnormality. Other: Carotid calcifications. IMPRESSION: 1. No acute intracranial abnormality.  No skull fracture. 2. No acute fracture or subluxation of the cervical spine. 3. Chronic changes in the head and cervical spine are stable from prior exam. Electronically Signed   By: Jeb Levering M.D.   On: 11/08/2016 02:04   Dg Chest Portable 1 View  Result Date: 11/08/2016 CLINICAL DATA:  Altered mental status EXAM: PORTABLE CHEST 1 VIEW COMPARISON:  Chest radiograph 10/08/2016 FINDINGS: Bibasilar atelectasis without other consolidation. No pleural effusion or pneumothorax. No pulmonary edema. Normal cardiomediastinal contours. Bilateral glenohumeral osteoarthrosis. IMPRESSION: Bibasilar atelectasis without focal airspace disease. Electronically Signed   By: Ulyses Jarred M.D.   On: 11/08/2016 02:12    EKG: Independently reviewed.  Assessment/Plan Principal Problem:   Syncope Active Problems:   Diabetes mellitus with complication (  [place])   Dementia    1. Syncope - 1. Suspect dehydration / orthostatic hypotension due to being back on ARB and PRN lasix 2. Holding ARB and PRN lasix 3. Tele monitor 4. Will hold off on repeat Echo since she just had this last month 5. Serial trops 2. DM2 - 1. Hold glipizide 2. Sensitive SSI AC 3. Dementia - 1. Chronic and stable 2. Continue home meds  DVT prophylaxis: Lovenox Code Status: Full Family Communication: Spoke with granddaughter on phone Disposition Plan: Home after admit Consults called: None Admission status: Place in [place], [facility] Pager [phone]  If 7AM-7PM, please contact day team taking care of patient [email or web address] Password [email or web address]  11/08/2016, 4:02 AM

## 2016-11-08 NOTE — Progress Notes (Signed)
New Admission Note:   Arrival Method: from ED  Mental Orientation: At the moment patient oriented to person and place, disoriented to time and situation Telemetry:3E box 1 Assessment: Completed Skin: Intact Pain:0/10 Colostomy present on admission. Changed prior arrival to [place] per  ED nurse.  Safety Measures: Safety Fall Prevention Plan has been discussed  Admission:  3 [place] Orientation: Patient has been orientated to the room, unit and staff.  Family: none at bedside  Orders to be reviewed and implemented. Will continue to monitor the patient. Call light has been placed within reach and bed alarm has been activated.   Admission questionnaire not completely completed due to patients mental status. Will follow up with day shift to be completed when family arrives.  Venetia Night, RN  Phone: [phone]

## 2016-11-09 DIAGNOSIS — I951 Orthostatic hypotension: Secondary | ICD-10-CM | POA: Diagnosis not present

## 2016-11-09 DIAGNOSIS — E118 Type 2 diabetes mellitus with unspecified complications: Secondary | ICD-10-CM | POA: Diagnosis not present

## 2016-11-09 DIAGNOSIS — E860 Dehydration: Secondary | ICD-10-CM | POA: Diagnosis not present

## 2016-11-09 DIAGNOSIS — F0390 Unspecified dementia without behavioral disturbance: Secondary | ICD-10-CM | POA: Diagnosis not present

## 2016-11-09 DIAGNOSIS — N179 Acute kidney failure, unspecified: Secondary | ICD-10-CM | POA: Diagnosis not present

## 2016-11-09 DIAGNOSIS — Z933 Colostomy status: Secondary | ICD-10-CM | POA: Diagnosis not present

## 2016-11-09 LAB — BASIC METABOLIC PANEL
Anion gap: 7 (ref 5–15)
BUN: 15 mg/dL (ref 6–20)
CALCIUM: 8.5 mg/dL — AB (ref 8.9–10.3)
CO2: 20 mmol/L — AB (ref 22–32)
CREATININE: 0.97 mg/dL (ref 0.44–1.00)
Chloride: 115 mmol/L — ABNORMAL HIGH (ref 101–111)
GFR calc non Af Amer: 53 mL/min — ABNORMAL LOW (ref >60)
GLUCOSE: 106 mg/dL — AB (ref 65–99)
Potassium: 5 mmol/L (ref 3.5–5.1)
Sodium: 142 mmol/L (ref 135–145)

## 2016-11-09 LAB — GLUCOSE, CAPILLARY
Glucose-Capillary: 102 mg/dL — ABNORMAL HIGH (ref 65–99)
Glucose-Capillary: 124 mg/dL — ABNORMAL HIGH (ref 65–99)
Glucose-Capillary: 95 mg/dL (ref 65–99)
Glucose-Capillary: 274 mg/dL — ABNORMAL HIGH (ref 65–99)

## 2016-11-09 NOTE — Evaluation (Signed)
Physical Therapy Evaluation/ discharge Patient Details Name: [name] MRN: [id number] DOB: [birth year] Today's Date: 11/09/2016   History of Present Illness   83 y.o. female with a history of dementia, T2DM, HTN, chronic HFpEF and recent admission for syncope. Admitted after found confused and down in bathroom by granddaughter  Clinical Impression  Pt pleasantly confused and willing to mobilize. Pt lives with family with nearly 24 hr assist per patient and is at baseline functional level per pt. Pt with generalized weakness but functional with use of RW and able to ambulate long hall distance with supervision for RW safety. Pt encouraged to ambulate with nursing as well as family daily at home. Pt with sufficient assist and equipment for return home without further therapy needs at this time due to pt at baseline. Pt aware and agreeable without further family present at this time. Will sign off.   HR 58-62    Follow Up Recommendations No PT follow up;Supervision/Assistance - 24 hour    Equipment Recommendations  None recommended by PT    Recommendations for Other Services       Precautions / Restrictions Precautions Precautions: Fall Restrictions Weight Bearing Restrictions: No      Mobility  Bed Mobility Overal bed mobility: Modified Independent                Transfers Overall transfer level: Modified independent                  Ambulation/Gait Ambulation/Gait assistance: Supervision Ambulation Distance (Feet): 300 Feet Assistive device: Rolling walker (2 wheeled) Gait Pattern/deviations: Step-through pattern;Decreased stride length   Gait velocity interpretation: at or above normal speed for age/gender General Gait Details: cues for position in RW and room number  Stairs            Wheelchair Mobility    Modified Rankin (Stroke Patients Only)       Balance Overall balance assessment: Needs assistance   Sitting balance-Leahy Scale:  Good       Standing balance-Leahy Scale: Fair                               Pertinent Vitals/Pain Pain Assessment: No/denies pain    Home Living Family/patient expects to be discharged to:: Private residence Living Arrangements: Children;Other relatives Available Help at Discharge: Family;Available 24 hours/day Type of Home: House Home Access: Level entry     Home Layout: One level Home Equipment: Walker - 2 wheels;Shower seat;Cane - single point      Prior Function Level of Independence: Needs assistance   Gait / Transfers Assistance Needed: walks with walker  ADL's / Homemaking Assistance Needed: supervision seated for bathing and dressing, family does homemaking, pt states aide 2x/wk for showering        Hand Dominance        Extremity/Trunk Assessment   Upper Extremity Assessment Upper Extremity Assessment: Generalized weakness    Lower Extremity Assessment Lower Extremity Assessment: Generalized weakness    Cervical / Trunk Assessment Cervical / Trunk Assessment: Normal  Communication   Communication: No difficulties  Cognition Arousal/Alertness: Awake/alert Behavior During Therapy: WFL for tasks assessed/performed Overall Cognitive Status: No family/caregiver present to determine baseline cognitive functioning                                 General Comments: pt confused regarding date and situation.  Repeated "school started today didn't it" 3x during session, history of dementia      General Comments      Exercises     Assessment/Plan    PT Assessment Patent does not need any further PT services  PT Problem List         PT Treatment Interventions      PT Goals (Current goals can be found in the Care Plan section)  Acute Rehab PT Goals Patient Stated Goal: return home PT Goal Formulation: All assessment and education complete, DC therapy    Frequency     Barriers to discharge        Co-evaluation                AM-PAC PT "6 Clicks" Daily Activity  Outcome Measure Difficulty turning over in bed (including adjusting bedclothes, sheets and blankets)?: None Difficulty moving from lying on back to sitting on the side of the bed? : None Difficulty sitting down on and standing up from a chair with arms (e.g., wheelchair, bedside commode, etc,.)?: A Little Help needed moving to and from a bed to chair (including a wheelchair)?: A Little Help needed walking in hospital room?: A Little Help needed climbing 3-5 steps with a railing? : A Little 6 Click Score: 20    End of Session Equipment Utilized During Treatment: Gait belt Activity Tolerance: Patient tolerated treatment well Patient left: in chair;with chair alarm set;with call bell/phone within reach Nurse Communication: Mobility status PT Visit Diagnosis: Difficulty in walking, not elsewhere classified (R26.2)    Time: 1245-8099 PT Time Calculation (min) (ACUTE ONLY): 21 min   Charges:   PT Evaluation $PT Eval Moderate Complexity: 1 Mod     PT G Codes:   PT G-Codes **NOT FOR INPATIENT CLASS** Functional Assessment Tool Used: AM-PAC 6 Clicks Basic Mobility;Clinical judgement Functional Limitation: Mobility: Walking and moving around Mobility: Walking and Moving Around Current Status (I3382): At least 20 percent but less than 40 percent impaired, limited or restricted Mobility: Walking and Moving Around Goal Status [phone]): At least 20 percent but less than 40 percent impaired, limited or restricted Mobility: Walking and Moving Around Discharge Status [phone]): At least 20 percent but less than 40 percent impaired, limited or restricted    Elwyn Reach, [place]   Sandy Salaam Peng Thorstenson 11/09/2016, 10:22 AM

## 2016-11-09 NOTE — Discharge Summary (Signed)
Physician Discharge Summary  MELESA LECY EXB:[id number] DOB: [birth year] DOA: 11/08/2016  PCP: Seward Carol, MD  Admit date: 11/08/2016 Discharge date: 11/09/2016  Admitted From: Home Disposition: Home   Recommendations for Outpatient Follow-up:  1. Follow up with PCP in the next week for hospital follow up appointment.  2. Holding ARB and lasix pending follow up due to symptomatic orthostatic hypotension, resolved at time of discharge. 3. Please obtain BMP to monitor renal function.  Home Health: None, 24-hr supervision recommended Equipment/Devices: None Discharge Condition: Stable CODE STATUS: Full Diet recommendation: Heart healthy  Brief/Interim Summary: [name] is an 83 y.o. female with a history of dementia, T2DM, HTN, chronic HFpEF and recent admission for syncope who presented 8/26 after being found in the bathroom floor confused. On arrival she was hypotensive, improving with IVF's, and had +orthostatic vital signs. She was brought in for observation, ARB and lasix stopped due to syncope related to hypovolemia. IV fluids provided with resolution of orthostasis. She has maintained elevated blood pressures, though these are not in severe range, and with the lability of her blood pressures, I am inclined to discontinue the ARB and diuretic until follow up.   Discharge Diagnoses:  Principal Problem:   Syncope Active Problems:   Diabetes mellitus with complication ([place])   Acute kidney injury ([place])   Dementia   Colostomy present ([place])  Recurrent syncope/orthostatic hypotension: Suspect hypovolemia after ARB and prn lasix restarted. This had been held on discharge from recent admission for same.  - Hold ARB and lasix. Willing to tolerate HTN that is not in severe range in this elderly female with frequent syncope and orthostasis.  - Orthostatic vital signs have normalized.  - No telemetry events noted  - No need to repeat echocardiogram - PT evaluated the patient,  recommended 24-hr supervision, which the patient has at home, and no further follow up.  Chronic diastolic CHF: Echocardiogram from 10/01/2016 report reviewed, 65-70% EF, mod LVH, G1DD, no regional WMA's. No significant valvular abnormalities. Weight at discharge is 159lbs after receiving IV fluids.  - Plan to stop ARB and lasix (recently changed to prn) as above. Will need quick follow up with PCP.  - Monitor volume status.  AKI: SCr 1.18 on admission with premorbid baseline 0.7-0.8 (also had AKI during hospitalization 7/26-7/27), improved to 0.97. Anticipate ongoing improvement.  - Recheck BMP at follow up.  T2DM: Well controlled with HbA1c 7.3% in 83yo.  - Restart glipizide  Dementia with behavioral disturbance: Chronic, stable.  - Continue home aricept, namenda, and seroquel.  Discharge Instructions Discharge Instructions    Diet - low sodium heart healthy    Complete by:  As directed    Discharge instructions    Complete by:  As directed    You were admitted for orthostatic hypotension (low blood pressure when rising) and had very finicky blood pressures. These have improved when cozaar and lasix were held and IV fluids were given. You are stable to discharge with the following recommendations:  - Do not take cozaar or lasix until you follow up with your doctor, preferably in 1 week.  - If you experience lightheadedness, fainting, chest pain, trouble breathing, or fever seek medical attention right away.   Increase activity slowly    Complete by:  As directed      Allergies as of 11/09/2016      Reactions   Aspirin Other (See Comments)   Triggers asthma      Medication List    STOP  taking these medications   furosemide 20 MG tablet Commonly known as:  LASIX   losartan 50 MG tablet Commonly known as:  COZAAR     TAKE these medications   acetaminophen 325 MG tablet Commonly known as:  TYLENOL Take 650 mg by mouth every 6 (six) hours as needed for headache (pain).    albuterol 108 (90 Base) MCG/ACT inhaler Commonly known as:  PROVENTIL HFA;VENTOLIN HFA Inhale 1-2 puffs into the lungs every 4 (four) hours as needed for wheezing or shortness of breath.   alendronate 70 MG tablet Commonly known as:  FOSAMAX Take 70 mg by mouth every Monday.   CALCIUM CITRATE + D 315-250 MG-UNIT Tabs Generic drug:  Calcium Citrate-Vitamin D Take 1 tablet by mouth daily.   donepezil 10 MG tablet Commonly known as:  ARICEPT Take 10 mg by mouth at bedtime.   gabapentin 300 MG capsule Commonly known as:  NEURONTIN Take 300 mg by mouth 3 (three) times daily.   glipiZIDE 5 MG 24 hr tablet Commonly known as:  GLUCOTROL XL Take 5 mg by mouth 2 (two) times daily with a meal.   NAMENDA XR 28 MG Cp24 24 hr capsule Generic drug:  memantine Take 28 mg by mouth daily.   probenecid 500 MG tablet Commonly known as:  BENEMID Take 250 mg by mouth daily.   QUEtiapine 25 MG tablet Commonly known as:  SEROQUEL Take 1 tablet (25 mg total) by mouth 2 (two) times daily.            Discharge Care Instructions        Start     Ordered   11/09/16 0000  Increase activity slowly     11/09/16 1322   11/09/16 0000  Diet - low sodium heart healthy     11/09/16 1322   11/09/16 0000  Discharge instructions    Comments:  You were admitted for orthostatic hypotension (low blood pressure when rising) and had very finicky blood pressures. These have improved when cozaar and lasix were held and IV fluids were given. You are stable to discharge with the following recommendations:  - Do not take cozaar or lasix until you follow up with your doctor, preferably in 1 week.  - If you experience lightheadedness, fainting, chest pain, trouble breathing, or fever seek medical attention right away.   11/09/16 1322     Follow-up Information    Polite, Jori Moll, MD. Schedule an appointment as soon as possible for a visit in 1 week(s).   Specialty:  Internal Medicine Contact  information: [address]  [place] [address] [phone]          Allergies  Allergen Reactions  . Aspirin Other (See Comments)    Triggers asthma    Consultations:  None  Procedures/Studies: Ct Head Wo Contrast  Result Date: 11/08/2016 CLINICAL DATA:  Head trauma, minor, GCS>=13, high clinical risk, initial exam; C-spine trauma, high clinical risk (NEXUS/CCR). Found in the floor in the bathroom. Altered mental status. EXAM: CT HEAD WITHOUT CONTRAST CT CERVICAL SPINE WITHOUT CONTRAST TECHNIQUE: Multidetector CT imaging of the head and cervical spine was performed following the standard protocol without intravenous contrast. Multiplanar CT image reconstructions of the cervical spine were also generated. COMPARISON:  CTA 10/08/2016, brain MRI 09/05/2016 FINDINGS: CT HEAD FINDINGS Brain: Stable degree of atrophy and chronic small vessel ischemia. No intracranial hemorrhage, mass effect, or midline shift. No hydrocephalus. The basilar cisterns are patent. MD sella and cerebellar tonsillar ectopia, stable  from prior exams. No evidence of territorial infarct or acute ischemia. No extra-axial or intracranial fluid collection. Vascular: No hyperdense vessel or unexpected calcification. Skull: No skull fracture.  No focal lesion. Sinuses/Orbits: Chronic paranasal sinus inflammation, similar to prior exam. Prior sinus surgery. No new sinus fluid levels. Persistent non opacification of lower right mastoid air cells. Chronic calcifications in the region of both optic nerves, stable. Other: None. CT CERVICAL SPINE FINDINGS Alignment: Normal alignment. Skull base and vertebrae: No acute fracture. Vertebral body heights are maintained. The dens and skull base are intact. Pannus about the odontoid) is unchanged. Soft tissues and spinal canal: No prevertebral fluid or swelling. No visible canal hematoma. Disc levels: Diffuse disc space narrowing and endplate spurring. Multilevel facet  arthropathy, some which is ossified. Upper chest: No acute abnormality. Other: Carotid calcifications. IMPRESSION: 1. No acute intracranial abnormality.  No skull fracture. 2. No acute fracture or subluxation of the cervical spine. 3. Chronic changes in the head and cervical spine are stable from prior exam. Electronically Signed   By: Jeb Levering M.D.   On: 11/08/2016 02:04   Ct Cervical Spine Wo Contrast  Result Date: 11/08/2016 CLINICAL DATA:  Head trauma, minor, GCS>=13, high clinical risk, initial exam; C-spine trauma, high clinical risk (NEXUS/CCR). Found in the floor in the bathroom. Altered mental status. EXAM: CT HEAD WITHOUT CONTRAST CT CERVICAL SPINE WITHOUT CONTRAST TECHNIQUE: Multidetector CT imaging of the head and cervical spine was performed following the standard protocol without intravenous contrast. Multiplanar CT image reconstructions of the cervical spine were also generated. COMPARISON:  CTA 10/08/2016, brain MRI 09/05/2016 FINDINGS: CT HEAD FINDINGS Brain: Stable degree of atrophy and chronic small vessel ischemia. No intracranial hemorrhage, mass effect, or midline shift. No hydrocephalus. The basilar cisterns are patent. MD sella and cerebellar tonsillar ectopia, stable from prior exams. No evidence of territorial infarct or acute ischemia. No extra-axial or intracranial fluid collection. Vascular: No hyperdense vessel or unexpected calcification. Skull: No skull fracture.  No focal lesion. Sinuses/Orbits: Chronic paranasal sinus inflammation, similar to prior exam. Prior sinus surgery. No new sinus fluid levels. Persistent non opacification of lower right mastoid air cells. Chronic calcifications in the region of both optic nerves, stable. Other: None. CT CERVICAL SPINE FINDINGS Alignment: Normal alignment. Skull base and vertebrae: No acute fracture. Vertebral body heights are maintained. The dens and skull base are intact. Pannus about the odontoid) is unchanged. Soft tissues and  spinal canal: No prevertebral fluid or swelling. No visible canal hematoma. Disc levels: Diffuse disc space narrowing and endplate spurring. Multilevel facet arthropathy, some which is ossified. Upper chest: No acute abnormality. Other: Carotid calcifications. IMPRESSION: 1. No acute intracranial abnormality.  No skull fracture. 2. No acute fracture or subluxation of the cervical spine. 3. Chronic changes in the head and cervical spine are stable from prior exam. Electronically Signed   By: Jeb Levering M.D.   On: 11/08/2016 02:04   Dg Chest Portable 1 View  Result Date: 11/08/2016 CLINICAL DATA:  Altered mental status EXAM: PORTABLE CHEST 1 VIEW COMPARISON:  Chest radiograph 10/08/2016 FINDINGS: Bibasilar atelectasis without other consolidation. No pleural effusion or pneumothorax. No pulmonary edema. Normal cardiomediastinal contours. Bilateral glenohumeral osteoarthrosis. IMPRESSION: Bibasilar atelectasis without focal airspace disease. Electronically Signed   By: Ulyses Jarred M.D.   On: 11/08/2016 02:12   Subjective: No events overnight, tolerated PT this AM. Feels well, has no pain or shortness of breath.   Discharge Exam: Vitals:   11/09/16 0800 11/09/16 1018  BP: Marland Kitchen)  167/93   Pulse: (!) 58 (!) 58  Resp: 20   Temp: 97.7 F (36.5 C)   SpO2: 97%    General: Pleasant elderly female in no distress sitting in chair Cardiovascular: Regular with rate in low 60's, no murmur, no JVD. LE's with trace pitting edema.  Respiratory: Nonlabored, clear Abdominal: Soft, NT, ND, bowel sounds +. Colostomy stoma appears healthy. Neuro: Alert, oriented to [facility], not ti reason for admission. No focal deficits.   Labs: Basic Metabolic Panel:  Recent Labs Lab 11/08/16 0209 11/09/16 0638  NA 140 142  K 5.1 5.0  CL 109 115*  CO2 22 20*  GLUCOSE 187* 106*  BUN 21* 15  CREATININE 1.18* 0.97  CALCIUM 9.0 8.5*   Liver Function Tests:  Recent Labs Lab 11/08/16 0209  AST 20   ALT 10*  ALKPHOS 85  BILITOT 0.6  PROT 6.4*  ALBUMIN 3.2*   CBC:  Recent Labs Lab 11/08/16 0209  WBC 6.8  HGB 12.4  HCT 38.1  MCV 94.8  PLT 172   Cardiac Enzymes:  Recent Labs Lab 11/08/16 0210 11/08/16 0616  CKTOTAL 76  --   TROPONINI  --  <0.03   Urinalysis    Component Value Date/Time   COLORURINE YELLOW 11/08/2016 0220   APPEARANCEUR CLEAR 11/08/2016 0220   LABSPEC 1.017 11/08/2016 0220   PHURINE 5.0 11/08/2016 0220   GLUCOSEU NEGATIVE 11/08/2016 0220   HGBUR NEGATIVE 11/08/2016 0220   BILIRUBINUR NEGATIVE 11/08/2016 0220   KETONESUR NEGATIVE 11/08/2016 0220   PROTEINUR NEGATIVE 11/08/2016 0220   UROBILINOGEN 1.0 01/09/2015 1620   NITRITE NEGATIVE 11/08/2016 0220   LEUKOCYTESUR NEGATIVE 11/08/2016 0220    Time coordinating discharge: Approximately 40 minutes  Vance Gather, MD  [facility] 11/09/2016, 1:22 PM Pager [phone]

## 2016-11-09 NOTE — Progress Notes (Signed)
Patient staple throughout the night, although hypertensive. Still confused about syncopal episode that occurred at home and is wondering why she is here. Patient updated on plan of care.

## 2017-02-11 ENCOUNTER — Ambulatory Visit
Admission: RE | Admit: 2017-02-11 | Discharge: 2017-02-11 | Disposition: A | Discharge status: Home / Self Care | Payer: Medicare Other | Source: Ambulatory Visit | Attending: Internal Medicine | Admitting: Internal Medicine

## 2017-02-11 ENCOUNTER — Other Ambulatory Visit: Payer: Self-pay | Admitting: Internal Medicine

## 2017-02-11 DIAGNOSIS — I5033 Acute on chronic diastolic (congestive) heart failure: Secondary | ICD-10-CM

## 2017-04-02 ENCOUNTER — Other Ambulatory Visit: Payer: Self-pay | Admitting: Internal Medicine

## 2017-04-02 DIAGNOSIS — Z1231 Encounter for screening mammogram for malignant neoplasm of breast: Secondary | ICD-10-CM

## 2017-06-17 ENCOUNTER — Ambulatory Visit
Admission: RE | Admit: 2017-06-17 | Discharge: 2017-06-17 | Disposition: A | Discharge status: Home / Self Care | Payer: Medicare Other | Source: Ambulatory Visit | Attending: Internal Medicine | Admitting: Internal Medicine

## 2017-06-17 DIAGNOSIS — Z1231 Encounter for screening mammogram for malignant neoplasm of breast: Secondary | ICD-10-CM

## 2017-07-08 ENCOUNTER — Emergency Department (HOSPITAL_COMMUNITY): Payer: Medicare Other

## 2017-07-08 ENCOUNTER — Emergency Department (HOSPITAL_COMMUNITY)
Admission: EM | Admit: 2017-07-08 | Discharge: 2017-07-08 | Disposition: A | Discharge status: Home / Self Care | Payer: Medicare Other | Attending: Physician Assistant | Admitting: Physician Assistant

## 2017-07-08 ENCOUNTER — Encounter (HOSPITAL_COMMUNITY): Payer: Self-pay | Admitting: Neurology

## 2017-07-08 DIAGNOSIS — E119 Type 2 diabetes mellitus without complications: Secondary | ICD-10-CM | POA: Diagnosis not present

## 2017-07-08 DIAGNOSIS — J45909 Unspecified asthma, uncomplicated: Secondary | ICD-10-CM | POA: Insufficient documentation

## 2017-07-08 DIAGNOSIS — F0390 Unspecified dementia without behavioral disturbance: Secondary | ICD-10-CM | POA: Insufficient documentation

## 2017-07-08 DIAGNOSIS — Z79899 Other long term (current) drug therapy: Secondary | ICD-10-CM | POA: Insufficient documentation

## 2017-07-08 DIAGNOSIS — R251 Tremor, unspecified: Secondary | ICD-10-CM | POA: Diagnosis not present

## 2017-07-08 LAB — CBC WITH DIFFERENTIAL/PLATELET
BASOS PCT: 1 %
Basophils Absolute: 0.1 10*3/uL (ref 0.0–0.1)
EOS ABS: 0.5 10*3/uL (ref 0.0–0.7)
EOS PCT: 8 %
HCT: 41.3 % (ref 36.0–46.0)
Hemoglobin: 13 g/dL (ref 12.0–15.0)
Lymphocytes Relative: 28 %
Lymphs Abs: 1.8 10*3/uL (ref 0.7–4.0)
MCH: 30.1 pg (ref 26.0–34.0)
MCHC: 31.5 g/dL (ref 30.0–36.0)
MCV: 95.6 fL (ref 78.0–100.0)
Monocytes Absolute: 0.4 10*3/uL (ref 0.1–1.0)
Monocytes Relative: 6 %
NEUTROS PCT: 57 %
Neutro Abs: 3.5 10*3/uL (ref 1.7–7.7)
PLATELETS: 185 10*3/uL (ref 150–400)
RBC: 4.32 MIL/uL (ref 3.87–5.11)
RDW: 13.9 % (ref 11.5–15.5)
WBC: 6.2 10*3/uL (ref 4.0–10.5)

## 2017-07-08 LAB — I-STAT CHEM 8, ED
BUN: 22 mg/dL — AB (ref 6–20)
CALCIUM ION: 1.16 mmol/L (ref 1.15–1.40)
CHLORIDE: 108 mmol/L (ref 101–111)
Creatinine, Ser: 1.2 mg/dL — ABNORMAL HIGH (ref 0.44–1.00)
Glucose, Bld: 132 mg/dL — ABNORMAL HIGH (ref 65–99)
HEMATOCRIT: 40 % (ref 36.0–46.0)
Hemoglobin: 13.6 g/dL (ref 12.0–15.0)
Potassium: 4.5 mmol/L (ref 3.5–5.1)
SODIUM: 140 mmol/L (ref 135–145)
TCO2: 21 mmol/L — AB (ref 22–32)

## 2017-07-08 NOTE — ED Notes (Signed)
E-signature not available. Patient/caregiver verbalizes understanding of discharge instructions; she has no further questions at this time.

## 2017-07-08 NOTE — ED Triage Notes (Signed)
Per ems- pt comes from home, family reports she was taking an neb tx, then started shaking after, this happened again. No LOC ,no fall. Is a x 4, but has hx of dementia. CBG 120. BP 121/56, HR 71 SR, 95% RA, RR 18.

## 2017-07-08 NOTE — ED Provider Notes (Signed)
[place] EMERGENCY DEPARTMENT Provider Note   CSN: [id number] Arrival date & time: 07/08/17  1240     History   Chief Complaint Chief Complaint  Patient presents with  . Shaking    HPI [name] is a 84 y.o. female.  HPI   84 year old female with history of dementia, diabetes, asthma, hypertension brought here via EMS from home for evaluation of "shaking" history is limited due to patient's baseline dementia.  Patient reports she was at home, and was developing some chills that lasting for approximately 15 minutes and resolved.  She is currently without any complaint.  No combines of fever, headache, productive cough, hemoptysis, chest pain, trouble breathing, abdominal pain, back pain, dysuria, numbness or weakness.  Per nursing note, patient was using her inhaler today when she was shaky afterward.  Patient did recall using her inhaler for her asthma.  Past Medical History:  Diagnosis Date  . Asthma   . Colostomy present ([place])    hx/notes 07/17/2010  . Dementia   . Hypertension    hx/notes 07/17/2010  . Pneumonia    recent/notes 10/08/2016  . Type II diabetes mellitus ([place])    hx/notes 07/17/2010    Patient Active Problem List   Diagnosis Date Noted  . Colostomy present ([place]) 11/08/2016  . Syncope 10/08/2016  . Acute kidney injury ([place]) 10/08/2016  . Abnormal chest x-ray 10/08/2016  . Lethargy 10/08/2016  . Dementia   . Sepsis ([place]) 09/05/2016  . Community acquired pneumonia 09/05/2016  . Toxic metabolic encephalopathy 77/82/4235  . Diabetes mellitus with complication ([place]) 36/14/4315  . Nausea & vomiting 04/09/2012  . Gallstones 03/15/2011  . Partial SBO 03/15/2011  . Colostomy hernia ([place]) 03/15/2011  . PSEUDOGOUT 02/28/2007  . IRON DEFIC ANEMIA [place] DIET IRON INTAKE 02/28/2007  . DISCITIS 02/28/2007  . OSTEOMYELITIS 02/28/2007  . CHILLS WITHOUT FEVER 02/28/2007    Past Surgical History:  Procedure Laterality Date  . ABDOMINAL  HYSTERECTOMY    . BACK SURGERY    . COLOSTOMY     S/P SBO hx/notes 07/17/2010  . LUMBAR FUSION  11/2006   hx/notes 07/17/2010  . NASAL SINUS SURGERY  04/28/2002   Bilateral inferior turbinate reductions, bilateral maxillary antrotomies, bilateral total ethmoidectomies, bilateral frontal recess explorations, bilateral sphenoidotomies, [employer] guidance./notes 07/28/2010     OB History   None      Home Medications    Prior to Admission medications   Medication Sig Start Date End Date Taking? Authorizing Provider  acetaminophen (TYLENOL) 325 MG tablet Take 650 mg by mouth every 6 (six) hours as needed for headache (pain).    [provider]  albuterol (PROVENTIL HFA;VENTOLIN HFA) 108 (90 Base) MCG/ACT inhaler Inhale 1-2 puffs into the lungs every 4 (four) hours as needed for wheezing or shortness of breath.     [provider]  alendronate (FOSAMAX) 70 MG tablet Take 70 mg by mouth every Monday.     [provider]  Calcium Citrate-Vitamin D (CALCIUM CITRATE + D) 315-250 MG-UNIT TABS Take 1 tablet by mouth daily.    [provider]  donepezil (ARICEPT) 10 MG tablet Take 10 mg by mouth at bedtime.  02/24/15   [provider]  gabapentin (NEURONTIN) 300 MG capsule Take 300 mg by mouth 3 (three) times daily. 10/12/15   [provider]  glipiZIDE (GLUCOTROL XL) 5 MG 24 hr tablet Take 5 mg by mouth 2 (two) times daily with a meal.     [provider]  memantine (NAMENDA XR) 28 MG CP24 24 hr capsule Take 28 mg by mouth daily.    [provider]  probenecid (BENEMID) 500 MG tablet Take 250 mg by mouth daily. 10/12/16   [provider]  QUEtiapine (SEROQUEL) 25 MG tablet Take 1 tablet (25 mg total) by mouth 2 (two) times daily. 09/09/16   Bonnielee Haff, MD    Family History Family History  Problem Relation Age of Onset  . Breast cancer Neg Hx     Social History Social History   Tobacco Use  . Smoking status:  Never Smoker  . Smokeless tobacco: Current User    Types: Chew  Substance Use Topics  . Alcohol use: No  . Drug use: No     Allergies   Aspirin   Review of Systems Review of Systems  Unable to perform ROS: Dementia     Physical Exam Updated Vital Signs BP (!) 149/63 (BP Location: Left Arm)   Pulse 66   Temp 97.9 F (36.6 C) (Oral)   Resp 18   SpO2 99%   Physical Exam  Constitutional: She appears well-developed and well-nourished. No distress.  Elderly female in bed in no acute discomfort  HENT:  Head: Atraumatic.  Mouth/Throat: Oropharynx is clear and moist.  Eyes: Pupils are equal, round, and reactive to light. Conjunctivae and EOM are normal.  Neck: Normal range of motion. Neck supple.  No nuchal rigidity  Cardiovascular: Normal rate and regular rhythm.  Pulmonary/Chest: Effort normal and breath sounds normal. No stridor. No respiratory distress. She has no wheezes. She has no rales.  Abdominal: Soft. Bowel sounds are normal. She exhibits no distension. There is no tenderness.  Musculoskeletal: She exhibits no edema.  Neurological: She is alert. GCS eye subscore is 4. GCS verbal subscore is 5. GCS motor subscore is 6.  Patient is alert to self, place, but unable to tell the month and year.  Skin: No rash noted.  Psychiatric: She has a normal mood and affect.  Nursing note and vitals reviewed.    ED Treatments / Results  Labs (all labs ordered are listed, but only abnormal results are displayed) Labs Reviewed  I-STAT CHEM 8, ED - Abnormal; Notable for the following components:      Result Value   BUN 22 (*)    Creatinine, Ser 1.20 (*)    Glucose, Bld 132 (*)    TCO2 21 (*)    All other components within normal limits  CBC WITH DIFFERENTIAL/PLATELET    EKG None  Radiology Dg Chest 2 View  Result Date: 07/08/2017 CLINICAL DATA:  Chills. EXAM: CHEST - 2 VIEW COMPARISON:  Chest x-ray dated February 11, 2017. FINDINGS: Stable cardiomegaly. Normal  pulmonary vascularity. No focal consolidation, pleural effusion, or pneumothorax. No acute osseous abnormality. IMPRESSION: No active cardiopulmonary disease. Electronically Signed   By: Titus Dubin M.D.   On: 07/08/2017 13:39   Ct Head Wo Contrast  Result Date: 07/08/2017 CLINICAL DATA:  Altered mental status. EXAM: CT HEAD WITHOUT CONTRAST TECHNIQUE: Contiguous axial images were obtained from the base of the skull through the vertex without intravenous contrast. COMPARISON:  CT head dated November 08, 2016. FINDINGS: Brain: No evidence of acute infarction, hemorrhage, hydrocephalus, extra-axial collection or mass lesion/mass effect. Stable atrophy and chronic microvascular ischemic changes. Unchanged empty sella and low-lying cerebellar tonsils. Vascular: Atherosclerotic vascular calcification of the carotid siphons. No hyperdense vessel. Skull: Negative for fracture or focal lesion. Sinuses/Orbits: Significantly progressed paranasal sinus mucosal  thickening involving all sinuses. Prior bilateral maxillary antrostomies. The mastoid air cells are clear. No acute orbital abnormality. Other: None. IMPRESSION: 1.  No acute intracranial abnormality. 2. Extensive paranasal sinus disease, progressed when compared to prior study. Electronically Signed   By: Titus Dubin M.D.   On: 07/08/2017 15:49    Procedures Procedures (including critical care time)  Medications Ordered in ED Medications - No data to display   Initial Impression / Assessment and Plan / ED Course  I have reviewed the triage vital signs and the nursing notes.  Pertinent labs & imaging results that were available during my care of the patient were reviewed by me and considered in my medical decision making (see chart for details).     BP (!) 149/63 (BP Location: Left Arm)   Pulse 66   Temp 97.9 F (36.6 C) (Oral)   Resp 18   SpO2 99%    Final Clinical Impressions(s) / ED Diagnoses   Final diagnoses:  Tremor    ED  Discharge Orders    None     2:05 PM Patient sent here from home after developing shaky tremors after using albuterol inhaler.  Granddaughter who is at bedside mentioned that patient was tremulous and her face was drawn up which lasting for a few seconds, and happen again for 2 separate episodes.  No loss of consciousness, and no postictal state.  No history of seizures in the past.  Her symptoms started shortly after she was using a nail.  Patient is currently at her baseline and was able to recall the events.  At this time, I have low suspicion for seizure however given her age, will obtain head CT scan.  Chest x-ray is unremarkable, labs are mostly reassuring.  Mild AKI with a creatinine of 1.2. Care discussed with Dr. Thomasene Lot.   3:53 PM Head CT without acute intracranial abnormality.  PT does have extensive paranasal sinus disease, which progressed when compared to prior study.  Since pt denies headache or sinus sxs, no treatment given.  Will discharge home with outpt f/u to PCP for further care.  Return precaution given.     Domenic Moras, PA-C 07/08/17 1557    [place], Fredia Sorrow, MD 07/14/17 1446

## 2017-07-08 NOTE — ED Notes (Signed)
Pt to CT at this time.

## 2017-07-08 NOTE — Discharge Instructions (Addendum)
Please follow up closely with your primary care provider for further evaluation of your condition.  Return if you have any concerns.

## 2017-07-08 NOTE — ED Notes (Signed)
Patient transported to X-ray 

## 2017-07-13 ENCOUNTER — Institutional Professional Consult (permissible substitution): Payer: Medicare Other | Admitting: Pulmonary Disease

## 2017-07-16 ENCOUNTER — Emergency Department (HOSPITAL_COMMUNITY)
Admission: EM | Admit: 2017-07-16 | Discharge: 2017-07-16 | Disposition: A | Discharge status: Home / Self Care | Payer: Medicare Other | Attending: Emergency Medicine | Admitting: Emergency Medicine

## 2017-07-16 ENCOUNTER — Encounter (HOSPITAL_COMMUNITY): Payer: Self-pay | Admitting: Emergency Medicine

## 2017-07-16 ENCOUNTER — Other Ambulatory Visit: Payer: Self-pay

## 2017-07-16 ENCOUNTER — Emergency Department (HOSPITAL_COMMUNITY): Payer: Medicare Other

## 2017-07-16 DIAGNOSIS — R251 Tremor, unspecified: Secondary | ICD-10-CM

## 2017-07-16 DIAGNOSIS — F0390 Unspecified dementia without behavioral disturbance: Secondary | ICD-10-CM | POA: Diagnosis not present

## 2017-07-16 DIAGNOSIS — I10 Essential (primary) hypertension: Secondary | ICD-10-CM | POA: Insufficient documentation

## 2017-07-16 DIAGNOSIS — Z79899 Other long term (current) drug therapy: Secondary | ICD-10-CM | POA: Insufficient documentation

## 2017-07-16 DIAGNOSIS — E119 Type 2 diabetes mellitus without complications: Secondary | ICD-10-CM | POA: Diagnosis not present

## 2017-07-16 DIAGNOSIS — J45909 Unspecified asthma, uncomplicated: Secondary | ICD-10-CM | POA: Diagnosis not present

## 2017-07-16 LAB — URINALYSIS, ROUTINE W REFLEX MICROSCOPIC
BILIRUBIN URINE: NEGATIVE
Bacteria, UA: NONE SEEN
GLUCOSE, UA: NEGATIVE mg/dL
Ketones, ur: NEGATIVE mg/dL
LEUKOCYTES UA: NEGATIVE
NITRITE: NEGATIVE
PH: 5 (ref 5.0–8.0)
Protein, ur: NEGATIVE mg/dL
SPECIFIC GRAVITY, URINE: 1.017 (ref 1.005–1.030)

## 2017-07-16 LAB — CBC WITH DIFFERENTIAL/PLATELET
BASOS ABS: 0 10*3/uL (ref 0.0–0.1)
Basophils Relative: 1 %
EOS ABS: 0.5 10*3/uL (ref 0.0–0.7)
EOS PCT: 8 %
HCT: 41.9 % (ref 36.0–46.0)
HEMOGLOBIN: 13.3 g/dL (ref 12.0–15.0)
Lymphocytes Relative: 31 %
Lymphs Abs: 1.9 10*3/uL (ref 0.7–4.0)
MCH: 30.5 pg (ref 26.0–34.0)
MCHC: 31.7 g/dL (ref 30.0–36.0)
MCV: 96.1 fL (ref 78.0–100.0)
Monocytes Absolute: 0.6 10*3/uL (ref 0.1–1.0)
Monocytes Relative: 9 %
NEUTROS PCT: 51 %
Neutro Abs: 3.2 10*3/uL (ref 1.7–7.7)
PLATELETS: 203 10*3/uL (ref 150–400)
RBC: 4.36 MIL/uL (ref 3.87–5.11)
RDW: 13.9 % (ref 11.5–15.5)
WBC: 6.2 10*3/uL (ref 4.0–10.5)

## 2017-07-16 LAB — BASIC METABOLIC PANEL
ANION GAP: 8 (ref 5–15)
BUN: 26 mg/dL — ABNORMAL HIGH (ref 6–20)
CHLORIDE: 109 mmol/L (ref 101–111)
CO2: 23 mmol/L (ref 22–32)
Calcium: 9.5 mg/dL (ref 8.9–10.3)
Creatinine, Ser: 1.23 mg/dL — ABNORMAL HIGH (ref 0.44–1.00)
GFR, EST AFRICAN AMERICAN: 46 mL/min — AB (ref >60)
GFR, EST NON AFRICAN AMERICAN: 40 mL/min — AB (ref >60)
Glucose, Bld: 118 mg/dL — ABNORMAL HIGH (ref 65–99)
POTASSIUM: 5.3 mmol/L — AB (ref 3.5–5.1)
SODIUM: 140 mmol/L (ref 135–145)

## 2017-07-16 NOTE — ED Notes (Signed)
Results reviewed, due to patient's mental status, will prioritize rooming

## 2017-07-16 NOTE — ED Notes (Signed)
Pt assisted to bathroom, steady gait

## 2017-07-16 NOTE — ED Provider Notes (Signed)
Patient placed in Quick Look pathway, seen and evaluated   Chief Complaint: "shaking"  HPI:   [name] is a 84 y.o. female who presents to the ED via EMS after her granddaughter called because the patient had an episode of shaking. The patient was evaluated for similar incident 07/08/17. Per EMS patient's family member wanted patient to come to the ED for evaluation because this time she was grunting.   ROS: Neuro: shaking  Physical Exam:   Gen: No distress  Neuro: Awake and Alert, no tremor at this time  Skin: Warm and dry    While I was talking with the patient she started shaking and I ask her if that is what happened earlier and she states yes. I told her she could stop and she did.       Initiation of care has begun. The patient has been counseled on the process, plan, and necessity for staying for the completion/evaluation, and the remainder of the medical screening examination    Ashley Murrain, NP 07/16/17 1453    Blanchie Dessert, MD 07/16/17 2103

## 2017-07-16 NOTE — ED Notes (Signed)
Pt alert and oriented x4. Skin warm and dry. Respirations equal and unlabored. s1 and s2 heart sounds audible. Pt denies pain, SOB, n/v/d. Daughter states patient has been having tremors , making her gait unsteady when at home. Daughter afraid patient my fall. Upon assessment pt has on nonskid socks, pt educated and safety and fall risk.Pt denies change of medications or reported fevers.

## 2017-07-16 NOTE — ED Notes (Signed)
Patient's daughter present.  Expressed concerns with patient still continuing to be in the lobby.  Explained reasons and reassured her.

## 2017-07-16 NOTE — ED Notes (Addendum)
Patient's family member has been stopping several staff members asking for a social work consult.  Patient and family reassured of ability to discuss this with provider once assessed.

## 2017-07-16 NOTE — ED Triage Notes (Addendum)
Per EMS- pt from home with daughter. A & O X 4. Pt was recenrlt here 4/25 diagnosed with tremors. Daughter wanted her transported back for re-evaluation of tremors because they are now accompanied with grunting. When being triaged the pt began having her tremors witnessed by the NP and myself and was able to stop at will when we stated we understand.

## 2017-07-16 NOTE — ED Provider Notes (Signed)
[facility] EMERGENCY DEPARTMENT Provider Note   CSN: [id number] Arrival date & time: 07/16/17  1433     History   Chief Complaint Chief Complaint  Patient presents with  . Tremors    HPI [name] is a 84 y.o. female who presents with tremors. PMH significant for asthma, dementia, DM, hx of CAP and UTI. Grandaughter is at bedside who states that for the past week and a half she has had intermittent tremors and chills. They went to [facility] on 4/25 for the same. Labs were unremarkable and CT head was negative. Symptoms were thought to be due to use of albuterol. The grandaughter states that she has continued to have intermittent tremors. Today it was different in that she started grunting and became SOB and was unable to respond so she called EMS. She also reports the patient's urine has a strong odor. The patient denies any complaints and mental status is at her baseline. No fever, chest pain, current SOB, abdominal pain, N/V, dysuria. She has a colostomy. Granddaughter is concerned because she does not know what to do for these tremor episodes and is requesting home health.  HPI  Past Medical History:  Diagnosis Date  . Asthma   . Colostomy present ([place])    hx/notes 07/17/2010  . Dementia   . Hypertension    hx/notes 07/17/2010  . Pneumonia    recent/notes 10/08/2016  . Type II diabetes mellitus ([place])    hx/notes 07/17/2010    Patient Active Problem List   Diagnosis Date Noted  . Colostomy present ([place]) 11/08/2016  . Syncope 10/08/2016  . Acute kidney injury ([place]) 10/08/2016  . Abnormal chest x-ray 10/08/2016  . Lethargy 10/08/2016  . Dementia   . Sepsis ([place]) 09/05/2016  . Community acquired pneumonia 09/05/2016  . Toxic metabolic encephalopathy 63/78/5885  . Diabetes mellitus with complication ([place]) 02/77/4128  . Nausea & vomiting 04/09/2012  . Gallstones 03/15/2011  . Partial SBO 03/15/2011  . Colostomy hernia ([place]) 03/15/2011  . PSEUDOGOUT  02/28/2007  . IRON DEFIC ANEMIA [place] DIET IRON INTAKE 02/28/2007  . DISCITIS 02/28/2007  . OSTEOMYELITIS 02/28/2007  . CHILLS WITHOUT FEVER 02/28/2007    Past Surgical History:  Procedure Laterality Date  . ABDOMINAL HYSTERECTOMY    . BACK SURGERY    . COLOSTOMY     S/P SBO hx/notes 07/17/2010  . LUMBAR FUSION  11/2006   hx/notes 07/17/2010  . NASAL SINUS SURGERY  04/28/2002   Bilateral inferior turbinate reductions, bilateral maxillary antrotomies, bilateral total ethmoidectomies, bilateral frontal recess explorations, bilateral sphenoidotomies, [employer] guidance./notes 07/28/2010     OB History   None      Home Medications    Prior to Admission medications   Medication Sig Start Date End Date Taking? Authorizing Provider  acetaminophen (TYLENOL) 325 MG tablet Take 650 mg by mouth every 6 (six) hours as needed for headache (pain).   Yes [provider]  albuterol (PROVENTIL HFA;VENTOLIN HFA) 108 (90 Base) MCG/ACT inhaler Inhale 1-2 puffs into the lungs every 4 (four) hours as needed for wheezing or shortness of breath.    Yes [provider]  albuterol (PROVENTIL) (2.5 MG/3ML) 0.083% nebulizer solution Inhale 2.5 mg into the lungs as needed. 05/26/17  Yes [provider]  alendronate (FOSAMAX) 70 MG tablet Take 70 mg by mouth every Monday.    Yes [provider]  Calcium Citrate-Vitamin D (CALCIUM CITRATE + D) 315-250 MG-UNIT TABS Take 1 tablet by mouth daily.  Yes [provider]  donepezil (ARICEPT) 10 MG tablet Take 10 mg by mouth at bedtime.  02/24/15  Yes [provider]  escitalopram (LEXAPRO) 10 MG tablet Take 10 mg by mouth at bedtime. 06/16/17  Yes [provider]  FLOVENT HFA 220 MCG/ACT inhaler Inhale 1 puff into the lungs 2 (two) times daily. 05/28/17  Yes [provider]  gabapentin (NEURONTIN) 300 MG capsule Take 300 mg by mouth 3 (three) times daily. 10/12/15  Yes [provider]  glipiZIDE  (GLUCOTROL XL) 5 MG 24 hr tablet Take 5 mg by mouth 2 (two) times daily with a meal.    Yes [provider]  hydrochlorothiazide (MICROZIDE) 12.5 MG capsule Take 12.5 mg by mouth daily. 05/10/17  Yes [provider]  losartan (COZAAR) 100 MG tablet Take 100 mg by mouth daily.   Yes [provider]  memantine (NAMENDA XR) 28 MG CP24 24 hr capsule Take 28 mg by mouth daily.   Yes [provider]  montelukast (SINGULAIR) 10 MG tablet Take 10 mg by mouth daily. 06/21/17  Yes [provider]  potassium chloride (K-DUR) 10 MEQ tablet Take 10 mEq by mouth 2 (two) times daily. 05/10/17  Yes [provider]  probenecid (BENEMID) 500 MG tablet Take 250 mg by mouth daily. 10/12/16  Yes [provider]  QUEtiapine (SEROQUEL) 25 MG tablet Take 1 tablet (25 mg total) by mouth 2 (two) times daily. Patient not taking: Reported on 07/16/2017 09/09/16   Bonnielee Haff, MD    Family History Family History  Problem Relation Age of Onset  . Breast cancer Neg Hx     Social History Social History   Tobacco Use  . Smoking status: Never Smoker  . Smokeless tobacco: Current User    Types: Chew  Substance Use Topics  . Alcohol use: No  . Drug use: No     Allergies   Aspirin   Review of Systems Review of Systems  Constitutional: Negative for fever.  Respiratory: Positive for shortness of breath.   Cardiovascular: Negative for chest pain.  Gastrointestinal: Negative for abdominal pain, nausea and vomiting.  Genitourinary: Negative for dysuria.  Neurological: Positive for tremors. Negative for dizziness, syncope, weakness, numbness and headaches.  Psychiatric/Behavioral: Positive for confusion (chronic).  All other systems reviewed and are negative.    Physical Exam Updated Vital Signs BP 133/75 (BP Location: Right Arm)   Pulse (!) 57   Temp 98.7 F (37.1 C) (Oral)   Resp 16   SpO2 100%   Physical Exam  Constitutional: She is oriented  to person, place, and time. She appears well-developed and well-nourished. No distress.  Elderly female in NAD. Oriented to person and place.   HENT:  Head: Normocephalic and atraumatic.  Eyes: Pupils are equal, round, and reactive to light. Conjunctivae are normal. Right eye exhibits no discharge. Left eye exhibits no discharge. No scleral icterus.  Neck: Normal range of motion.  Cardiovascular: Normal rate and regular rhythm.  Pulmonary/Chest: Effort normal and breath sounds normal. No respiratory distress.  Abdominal: Soft. Bowel sounds are normal. She exhibits no distension. There is no tenderness.  Colostomy  Neurological: She is alert and oriented to person, place, and time.  Skin: Skin is warm and dry.  Psychiatric: She has a normal mood and affect. Her behavior is normal.  Nursing note and vitals reviewed.    ED Treatments / Results  Labs (all labs ordered are listed, but only abnormal results are displayed) Labs  Reviewed  BASIC METABOLIC PANEL - Abnormal; Notable for the following components:      Result Value   Potassium 5.3 (*)    Glucose, Bld 118 (*)    BUN 26 (*)    Creatinine, Ser 1.23 (*)    GFR calc non Af Amer 40 (*)    GFR calc Af Amer 46 (*)    All other components within normal limits  URINALYSIS, ROUTINE W REFLEX MICROSCOPIC - Abnormal; Notable for the following components:   Hgb urine dipstick MODERATE (*)    All other components within normal limits  URINE CULTURE  CBC WITH DIFFERENTIAL/PLATELET    EKG None  Radiology Dg Chest 2 View  Result Date: 07/16/2017 CLINICAL DATA:  Shortness of breath. EXAM: CHEST - 2 VIEW COMPARISON:  Two-view chest x-ray 07/08/2017 FINDINGS: Effusion the heart is mildly enlarged to suggest. There is no edema or failure. Degenerative changes are present throughout the thoracolumbar spine. IMPRESSION: No active cardiopulmonary disease. Electronically Signed   By: San Morelle M.D.   On: 07/16/2017 22:02     Procedures Procedures (including critical care time)  Medications Ordered in ED Medications - No data to display   Initial Impression / Assessment and Plan / ED Course  I have reviewed the triage vital signs and the nursing notes.  Pertinent labs & imaging results that were available during my care of the patient were reviewed by me and considered in my medical decision making (see chart for details).  84 year old female presents with "tremors" with unclear etiology. Her vitals are normal. Her exam is overall unremarkable. She has no complaints. Her granddaughter is concerned because she does not know what to do during these tremor episodes and has two small children at home. Labs obtained in triage are unremarkable. Will obtain UA and CXR and reassess. Will not repeat CT as she has this several days ago.   UA is normal. CXR is negative. Will d/c home. CM was consulted and patient was advised they will call in the morning but she needs to talk to her PCP about coordinating a higher level of care.  Final Clinical Impressions(s) / ED Diagnoses   Final diagnoses:  Occasional tremors    ED Discharge Orders    None       Recardo Evangelist, PA-C 07/17/17 0006    Virgel Manifold, MD 07/17/17 1757

## 2017-07-16 NOTE — ED Notes (Signed)
Patient altered

## 2017-07-17 NOTE — Care Management Note (Signed)
Case Management Note  Patient Details  Name: MIKAILAH MOREL MRN: [id number] Date of Birth: Feb 16, 1934  Subjective/Objective:                 Spoke w patient's daughter, would like to use [facility]. Referral accepted by Lucita Ferrara. SOC will be Tuesday. No other CM needs identified.    Action/Plan:   Expected Discharge Date:                  Expected Discharge Plan:  [place]  In-House Referral:     Discharge planning Services  CM Consult  Post Acute Care Choice:  Home Health Choice offered to:  Adult Children  DME Arranged:    DME Agency:     HH Arranged:  PT, OT, Nurse's Aide [place] Agency:  [facility] ([facility]  Status of Service:  Completed, signed off  If discussed at [employer], dates discussed:    Additional Comments:  Carles Collet, RN 07/17/2017, 8:51 AM

## 2017-07-18 ENCOUNTER — Observation Stay (HOSPITAL_COMMUNITY)
Admission: EM | Admit: 2017-07-18 | Discharge: 2017-07-19 | Disposition: A | Discharge status: Home Health | Payer: Medicare Other | Attending: Internal Medicine | Admitting: Internal Medicine

## 2017-07-18 ENCOUNTER — Other Ambulatory Visit: Payer: Self-pay

## 2017-07-18 ENCOUNTER — Encounter (HOSPITAL_COMMUNITY): Payer: Self-pay | Admitting: *Deleted

## 2017-07-18 DIAGNOSIS — Z7984 Long term (current) use of oral hypoglycemic drugs: Secondary | ICD-10-CM | POA: Insufficient documentation

## 2017-07-18 DIAGNOSIS — Z886 Allergy status to analgesic agent status: Secondary | ICD-10-CM | POA: Insufficient documentation

## 2017-07-18 DIAGNOSIS — R05 Cough: Secondary | ICD-10-CM | POA: Diagnosis present

## 2017-07-18 DIAGNOSIS — I10 Essential (primary) hypertension: Secondary | ICD-10-CM | POA: Insufficient documentation

## 2017-07-18 DIAGNOSIS — Z981 Arthrodesis status: Secondary | ICD-10-CM | POA: Diagnosis not present

## 2017-07-18 DIAGNOSIS — R251 Tremor, unspecified: Secondary | ICD-10-CM | POA: Insufficient documentation

## 2017-07-18 DIAGNOSIS — E119 Type 2 diabetes mellitus without complications: Secondary | ICD-10-CM | POA: Insufficient documentation

## 2017-07-18 DIAGNOSIS — J45909 Unspecified asthma, uncomplicated: Secondary | ICD-10-CM | POA: Insufficient documentation

## 2017-07-18 DIAGNOSIS — F0390 Unspecified dementia without behavioral disturbance: Secondary | ICD-10-CM | POA: Diagnosis not present

## 2017-07-18 DIAGNOSIS — F444 Conversion disorder with motor symptom or deficit: Secondary | ICD-10-CM | POA: Diagnosis not present

## 2017-07-18 DIAGNOSIS — E860 Dehydration: Secondary | ICD-10-CM | POA: Diagnosis not present

## 2017-07-18 DIAGNOSIS — N179 Acute kidney failure, unspecified: Secondary | ICD-10-CM | POA: Diagnosis not present

## 2017-07-18 DIAGNOSIS — Z79899 Other long term (current) drug therapy: Secondary | ICD-10-CM | POA: Diagnosis not present

## 2017-07-18 DIAGNOSIS — R630 Anorexia: Secondary | ICD-10-CM

## 2017-07-18 DIAGNOSIS — Z720 Tobacco use: Secondary | ICD-10-CM | POA: Diagnosis not present

## 2017-07-18 DIAGNOSIS — R059 Cough, unspecified: Secondary | ICD-10-CM

## 2017-07-18 DIAGNOSIS — R34 Anuria and oliguria: Secondary | ICD-10-CM

## 2017-07-18 LAB — COMPREHENSIVE METABOLIC PANEL
ALK PHOS: 77 U/L (ref 38–126)
ALT: 10 U/L — AB (ref 14–54)
AST: 18 U/L (ref 15–41)
Albumin: 3.4 g/dL — ABNORMAL LOW (ref 3.5–5.0)
Anion gap: 7 (ref 5–15)
BUN: 34 mg/dL — AB (ref 6–20)
CHLORIDE: 110 mmol/L (ref 101–111)
CO2: 22 mmol/L (ref 22–32)
CREATININE: 1.52 mg/dL — AB (ref 0.44–1.00)
Calcium: 9 mg/dL (ref 8.9–10.3)
GFR calc Af Amer: 36 mL/min — ABNORMAL LOW (ref >60)
GFR, EST NON AFRICAN AMERICAN: 31 mL/min — AB (ref >60)
Glucose, Bld: 120 mg/dL — ABNORMAL HIGH (ref 65–99)
Potassium: 5 mmol/L (ref 3.5–5.1)
Sodium: 139 mmol/L (ref 135–145)
Total Bilirubin: 0.6 mg/dL (ref 0.3–1.2)
Total Protein: 6.6 g/dL (ref 6.5–8.1)

## 2017-07-18 LAB — CBC WITH DIFFERENTIAL/PLATELET
BASOS ABS: 0 10*3/uL (ref 0.0–0.1)
Basophils Relative: 0 %
EOS PCT: 8 %
Eosinophils Absolute: 0.4 10*3/uL (ref 0.0–0.7)
HCT: 40.4 % (ref 36.0–46.0)
HEMOGLOBIN: 12.9 g/dL (ref 12.0–15.0)
LYMPHS ABS: 1.8 10*3/uL (ref 0.7–4.0)
Lymphocytes Relative: 32 %
MCH: 30.6 pg (ref 26.0–34.0)
MCHC: 31.9 g/dL (ref 30.0–36.0)
MCV: 95.7 fL (ref 78.0–100.0)
MONOS PCT: 8 %
Monocytes Absolute: 0.4 10*3/uL (ref 0.1–1.0)
NEUTROS PCT: 52 %
Neutro Abs: 2.9 10*3/uL (ref 1.7–7.7)
PLATELETS: 196 10*3/uL (ref 150–400)
RBC: 4.22 MIL/uL (ref 3.87–5.11)
RDW: 13.8 % (ref 11.5–15.5)
WBC: 5.5 10*3/uL (ref 4.0–10.5)

## 2017-07-18 LAB — URINE CULTURE: CULTURE: NO GROWTH

## 2017-07-18 LAB — AMMONIA: AMMONIA: 21 umol/L (ref 9–35)

## 2017-07-18 LAB — URINALYSIS, ROUTINE W REFLEX MICROSCOPIC
Bilirubin Urine: NEGATIVE
Glucose, UA: NEGATIVE mg/dL
Ketones, ur: NEGATIVE mg/dL
Leukocytes, UA: NEGATIVE
Nitrite: NEGATIVE
Protein, ur: NEGATIVE mg/dL
Specific Gravity, Urine: 1.013 (ref 1.005–1.030)
pH: 5 (ref 5.0–8.0)

## 2017-07-18 LAB — GLUCOSE, CAPILLARY
Glucose-Capillary: 97 mg/dL (ref 65–99)
Glucose-Capillary: 191 mg/dL — ABNORMAL HIGH (ref 65–99)

## 2017-07-18 LAB — TSH: TSH: 3.236 u[IU]/mL (ref 0.350–4.500)

## 2017-07-18 MED ORDER — SODIUM CHLORIDE 0.9 % IV BOLUS
1000.0000 mL | Freq: Once | INTRAVENOUS | Status: AC
  Administered 2017-07-18: 1000 mL via INTRAVENOUS

## 2017-07-18 MED ORDER — INSULIN ASPART 100 UNIT/ML ~~LOC~~ SOLN: 0.0000 [IU] | Freq: Every day | SUBCUTANEOUS | Status: DC

## 2017-07-18 MED ORDER — MEMANTINE HCL ER 28 MG PO CP24
28.0000 mg | ORAL_CAPSULE | Freq: Every day | ORAL | Status: DC
  Administered 2017-07-19: 28 mg via ORAL
  Filled 2017-07-18: qty 1

## 2017-07-18 MED ORDER — ESCITALOPRAM OXALATE 10 MG PO TABS
10.0000 mg | ORAL_TABLET | Freq: Every day | ORAL | Status: DC
  Administered 2017-07-18: 10 mg via ORAL
  Filled 2017-07-18: qty 1

## 2017-07-18 MED ORDER — INSULIN ASPART 100 UNIT/ML ~~LOC~~ SOLN
0.0000 [IU] | Freq: Three times a day (TID) | SUBCUTANEOUS | Status: DC
  Administered 2017-07-19: 1 [IU] via SUBCUTANEOUS

## 2017-07-18 MED ORDER — ACETAMINOPHEN 325 MG PO TABS: 650.0000 mg | ORAL_TABLET | Freq: Four times a day (QID) | ORAL | Status: DC | PRN

## 2017-07-18 MED ORDER — ENOXAPARIN SODIUM 30 MG/0.3ML ~~LOC~~ SOLN
30.0000 mg | SUBCUTANEOUS | Status: DC
  Administered 2017-07-18: 30 mg via SUBCUTANEOUS
  Filled 2017-07-18: qty 0.3

## 2017-07-18 MED ORDER — MONTELUKAST SODIUM 10 MG PO TABS
10.0000 mg | ORAL_TABLET | Freq: Every day | ORAL | Status: DC
  Administered 2017-07-19: 10 mg via ORAL
  Filled 2017-07-18: qty 1

## 2017-07-18 MED ORDER — DONEPEZIL HCL 10 MG PO TABS
10.0000 mg | ORAL_TABLET | Freq: Every day | ORAL | Status: DC
  Administered 2017-07-18: 10 mg via ORAL
  Filled 2017-07-18: qty 1

## 2017-07-18 MED ORDER — ALBUTEROL SULFATE (2.5 MG/3ML) 0.083% IN NEBU: 2.5000 mg | INHALATION_SOLUTION | RESPIRATORY_TRACT | Status: DC | PRN

## 2017-07-18 MED ORDER — SODIUM CHLORIDE 0.9 % IV SOLN
INTRAVENOUS | Status: DC
  Administered 2017-07-18 – 2017-07-19 (×2): via INTRAVENOUS
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
## 2017-07-18 NOTE — ED Notes (Signed)
ED Provider at bedside. 

## 2017-07-18 NOTE — ED Provider Notes (Signed)
[name] DEPT Provider Note   CSN: [id number] Arrival date & time: 07/18/17  1245     History   Chief Complaint Chief Complaint  Patient presents with  . Tremors    HPI [name] is a 84 y.o. female with a PMHx of asthma, dementia, HTN, DM2, and other conditions listed below, who presents to the ED with complaints of ongoing intermittent tremors x1.5 weeks.  LEVEL 5 CAVEAT DUE TO DEMENTIA, some of the history is obtained by calling her granddaughter Beau Fanny (cell # [phone]).  [name] states that she has had intermittent tremors for the last 1.5 weeks, states that it started as a shaking of her lips and mouth but then progressed to having shaking of her hands as well.  She states that happens for several seconds-minutes before resolving, but occurs multiple times a day and when it occurs she doesn't really talk.  Chart review reveals that she was seen at [facility] ER on 07/08/17 for very similar issue, her labs showed a mild AKI but otherwise were unremarkable, CXR was negative, and CT head was negative aside from paranasal sinus disease which was somewhat chronic.  She was seen again there on 07/16/17 and her labs again showed a mild AKI, but urinalysis/CXR were unremarkable, case management was consulted to help with home health needs and were going to follow up with them outpatient.  Does not appear that neurology was ever consulted on either of these ED visits.  Granddaughter states that she has not yet made an neurology appointment, she plans to call them tomorrow to set this up.  She has also noticed that the patient has had less of an appetite and has had decreased urine output recently, and was concerned regarding the tremors, so she brought her back for repeat evaluation.  She mentions that she has a cough due to asthma, but states that this is fairly chronic and typical for her.  She states that she has not needed her albuterol inhaler in about  2 to 3 days.  [name] denies that the pt has had any fevers, nausea, vomiting, diarrhea, constipation, or any other acute concerns recently.  She states that the pt is at her mental baseline and has not been acting confused or altered in any way.   When the patient is asked why she is here, she states that today she started having a dry cough and she vomited, she is not sure how much she vomited but denies that there was any blood.  She thinks that it could have been because she coughed, but she is not sure.  She denies feeling nauseated.  She states that her cough improves with albuterol, no known aggravating factors, but she's not really sure.  She denies fevers, chills, CP, SOB, abd pain, nausea, diarrhea/constipation, hematemesis, hematuria, dysuria, numbness, tingling, focal weakness, or any other complaints at this time, however this history is limited by the fact that she has dementia and is a poor/unreliable historian.  The history is provided by the patient, a relative and medical records. History limited by: dementia. No language interpreter was used.    Past Medical History:  Diagnosis Date  . Asthma   . Colostomy present ([place])    hx/notes 07/17/2010  . Dementia   . Hypertension    hx/notes 07/17/2010  . Pneumonia    recent/notes 10/08/2016  . Type II diabetes mellitus ([place])    hx/notes 07/17/2010    Patient Active Problem List  Diagnosis Date Noted  . Colostomy present ([place]) 11/08/2016  . Syncope 10/08/2016  . Acute kidney injury ([place]) 10/08/2016  . Abnormal chest x-ray 10/08/2016  . Lethargy 10/08/2016  . Dementia   . Sepsis ([place]) 09/05/2016  . Community acquired pneumonia 09/05/2016  . Toxic metabolic encephalopathy 54/49/2010  . Diabetes mellitus with complication ([place]) 09/24/1973  . Nausea & vomiting 04/09/2012  . Gallstones 03/15/2011  . Partial SBO 03/15/2011  . Colostomy hernia ([place]) 03/15/2011  . PSEUDOGOUT 02/28/2007  . IRON DEFIC ANEMIA [place] DIET  IRON INTAKE 02/28/2007  . DISCITIS 02/28/2007  . OSTEOMYELITIS 02/28/2007  . CHILLS WITHOUT FEVER 02/28/2007    Past Surgical History:  Procedure Laterality Date  . ABDOMINAL HYSTERECTOMY    . BACK SURGERY    . COLOSTOMY     S/P SBO hx/notes 07/17/2010  . LUMBAR FUSION  11/2006   hx/notes 07/17/2010  . NASAL SINUS SURGERY  04/28/2002   Bilateral inferior turbinate reductions, bilateral maxillary antrotomies, bilateral total ethmoidectomies, bilateral frontal recess explorations, bilateral sphenoidotomies, Instatrac guidance./notes 07/28/2010     OB History   None      Home Medications    Prior to Admission medications   Medication Sig Start Date End Date Taking? Authorizing Provider  acetaminophen (TYLENOL) 325 MG tablet Take 650 mg by mouth every 6 (six) hours as needed for headache (pain).    [provider]  albuterol (PROVENTIL HFA;VENTOLIN HFA) 108 (90 Base) MCG/ACT inhaler Inhale 1-2 puffs into the lungs every 4 (four) hours as needed for wheezing or shortness of breath.     [provider]  albuterol (PROVENTIL) (2.5 MG/3ML) 0.083% nebulizer solution Inhale 2.5 mg into the lungs as needed. 05/26/17   [provider]  alendronate (FOSAMAX) 70 MG tablet Take 70 mg by mouth every Monday.     [provider]  Calcium Citrate-Vitamin D (CALCIUM CITRATE + D) 315-250 MG-UNIT TABS Take 1 tablet by mouth daily.    [provider]  donepezil (ARICEPT) 10 MG tablet Take 10 mg by mouth at bedtime.  02/24/15   [provider]  escitalopram (LEXAPRO) 10 MG tablet Take 10 mg by mouth at bedtime. 06/16/17   [provider]  FLOVENT HFA 220 MCG/ACT inhaler Inhale 1 puff into the lungs 2 (two) times daily. 05/28/17   [provider]  gabapentin (NEURONTIN) 300 MG capsule Take 300 mg by mouth 3 (three) times daily. 10/12/15   [provider]  glipiZIDE (GLUCOTROL XL) 5 MG 24 hr tablet Take 5 mg by mouth 2 (two) times  daily with a meal.     [provider]  hydrochlorothiazide (MICROZIDE) 12.5 MG capsule Take 12.5 mg by mouth daily. 05/10/17   [provider]  losartan (COZAAR) 100 MG tablet Take 100 mg by mouth daily.    [provider]  memantine (NAMENDA XR) 28 MG CP24 24 hr capsule Take 28 mg by mouth daily.    [provider]  montelukast (SINGULAIR) 10 MG tablet Take 10 mg by mouth daily. 06/21/17   [provider]  potassium chloride (K-DUR) 10 MEQ tablet Take 10 mEq by mouth 2 (two) times daily. 05/10/17   [provider]  probenecid (BENEMID) 500 MG tablet Take 250 mg by mouth daily. 10/12/16   [provider]    Family History Family History  Problem Relation Age of Onset  . Breast cancer Neg Hx     Social History Social History   Tobacco Use  .  Smoking status: Never Smoker  . Smokeless tobacco: Current User    Types: Chew  Substance Use Topics  . Alcohol use: No  . Drug use: No     Allergies   Aspirin   Review of Systems Review of Systems  Unable to perform ROS: Dementia  Constitutional: Positive for appetite change. Negative for chills and fever.  Respiratory: Positive for cough. Negative for shortness of breath.   Cardiovascular: Negative for chest pain.  Gastrointestinal: Positive for vomiting. Negative for abdominal pain, constipation, diarrhea and nausea.  Genitourinary: Positive for decreased urine volume. Negative for dysuria and hematuria.  Allergic/Immunologic: Positive for immunocompromised state (DM2).  Neurological: Positive for tremors. Negative for weakness and numbness.  Psychiatric/Behavioral: Negative for confusion.   LEVEL 5 CAVEAT DUE TO DEMENTIA  Physical Exam Updated Vital Signs BP 127/68   Pulse (!) 58   Temp 97.8 F (36.6 C) (Oral)   Resp 16   SpO2 97%   Physical Exam  Constitutional: Vital signs are normal. She appears well-developed and well-nourished.  Non-toxic appearance. No  distress.  Afebrile, nontoxic, NAD  HENT:  Head: Normocephalic and atraumatic.  Mouth/Throat: Oropharynx is clear and moist. Mucous membranes are dry.  Lips a little dry  Eyes: Conjunctivae and EOM are normal. Right eye exhibits no discharge. Left eye exhibits no discharge.  Neck: Normal range of motion. Neck supple.  Cardiovascular: Normal rate, regular rhythm, normal heart sounds and intact distal pulses. Exam reveals no gallop and no friction rub.  No murmur heard. Pulmonary/Chest: Effort normal and breath sounds normal. No respiratory distress. She has no decreased breath sounds. She has no wheezes. She has no rhonchi. She has no rales.  CTAB in all lung fields, no w/r/r, no hypoxia or increased WOB, speaking in full sentences, SpO2 97% on RA   Abdominal: Soft. Normal appearance and bowel sounds are normal. She exhibits no distension. There is no tenderness. There is no rigidity, no rebound, no guarding, no CVA tenderness, no tenderness at McBurney's point and negative Murphy's sign.  Colostomy in LLQ, stoma pink, stool and air in bag Abd soft, NTND, +BS throughout, no r/g/r, neg murphy's, neg mcburney's, no CVA TTP   Musculoskeletal: Normal range of motion.  MAE x4 Strength and sensation grossly intact in all extremities Distal pulses intact  Neurological: She is alert. She has normal strength. She displays no tremor. No sensory deficit.  A&O to person and place, somewhat to time (knows president but not year) No tremors noted  Skin: Skin is warm, dry and intact. No rash noted.  Psychiatric: She has a normal mood and affect.  Nursing note and vitals reviewed.    ED Treatments / Results  Labs (all labs ordered are listed, but only abnormal results are displayed) Labs Reviewed  COMPREHENSIVE METABOLIC PANEL - Abnormal; Notable for the following components:      Result Value   Glucose, Bld 120 (*)    BUN 34 (*)    Creatinine, Ser 1.52 (*)    Albumin 3.4 (*)    ALT 10 (*)     GFR calc non Af Amer 31 (*)    GFR calc Af Amer 36 (*)    All other components within normal limits  CBC WITH DIFFERENTIAL/PLATELET  AMMONIA  URINALYSIS, ROUTINE W REFLEX MICROSCOPIC  TSH    EKG EKG Interpretation  Date/Time:  Sunday Jul 18 2017 14:05:50 EDT Ventricular Rate:  54 PR Interval:    QRS Duration: 101 QT Interval:  430 QTC  Calculation: 408 R Axis:   -40 Text Interpretation:  Sinus rhythm Left axis deviation Abnormal R-wave progression, late transition No significant change since last tracing Confirmed by Duffy Bruce [phone]) on 07/18/2017 2:40:42 PM   Radiology Dg Chest 2 View  Result Date: 07/16/2017 CLINICAL DATA:  Shortness of breath. EXAM: CHEST - 2 VIEW COMPARISON:  Two-view chest x-ray 07/08/2017 FINDINGS: Effusion the heart is mildly enlarged to suggest. There is no edema or failure. Degenerative changes are present throughout the thoracolumbar spine. IMPRESSION: No active cardiopulmonary disease. Electronically Signed   By: San Morelle M.D.   On: 07/16/2017 22:02    CT head w/o contrast 07/08/17: Study Result  CLINICAL DATA:  Altered mental status.  EXAM: CT HEAD WITHOUT CONTRAST  TECHNIQUE: Contiguous axial images were obtained from the base of the skull through the vertex without intravenous contrast.  COMPARISON:  CT head dated November 08, 2016.  FINDINGS: Brain: No evidence of acute infarction, hemorrhage, hydrocephalus, extra-axial collection or mass lesion/mass effect. Stable atrophy and chronic microvascular ischemic changes. Unchanged empty sella and low-lying cerebellar tonsils.  Vascular: Atherosclerotic vascular calcification of the carotid siphons. No hyperdense vessel.  Skull: Negative for fracture or focal lesion.  Sinuses/Orbits: Significantly progressed paranasal sinus mucosal thickening involving all sinuses. Prior bilateral maxillary antrostomies. The mastoid air cells are clear. No acute  orbital abnormality.  Other: None.  IMPRESSION: 1.  No acute intracranial abnormality. 2. Extensive paranasal sinus disease, progressed when compared to prior study.   Electronically Signed   By: Titus Dubin M.D.   On: 07/08/2017 15:49     Procedures Procedures (including critical care time)  Medications Ordered in ED Medications  sodium chloride 0.9 % bolus 1,000 mL (1,000 mLs Intravenous New Bag/Given 07/18/17 1509)     Initial Impression / Assessment and Plan / ED Course  I have reviewed the triage vital signs and the nursing notes.  Pertinent labs & imaging results that were available during my care of the patient were reviewed by me and considered in my medical decision making (see chart for details).     84 y.o. female here for repeat eval of tremors x1.5wks. Level 5 caveat due to dementia, some information obtained via phone from granddaughter. Granddaughter states pt has had decreased appetite, decreased UOP, and intermittent tremors. Pt reports cough and vomiting today but granddaughter states that she always has a cough due to asthma, and that she hasn't been vomiting. On exam, lips a little dry, lungs clear, pt oriented to person/place and somewhat to time (knows president, but not year), no abdominal tenderness, colostomy in place in LLQ, no tremors noted. She was seen for similar issues on 07/08/17 and had a CT head which was negative, labs showed mild AKI, CXR neg; on 07/16/17 she had a CXR which was also neg, labs again showed mild AKI, U/A unremarkable, and she was discharged. She hasn't yet had an appt with neuro. Will start with labs/urine, doubt need for repeat CT head or CXR. Will reassess shortly. May consider neuro consult once results return. Discussed case with my attending Dr. Ellender Hose who agrees with plan.   3:16 PM CBC w/diff WNL. CMP with gradually worsening kidney function, Cr today 1.52, BUN 34 (up from Cr 0.97/BUN 15 on 11/09/16 --> 1.2/22 on 07/08/17  --> 1.23/26 on 07/16/17). Fluid bolus started. Ammonia WNL. EKG nonischemic and without acute changes. TSH and U/A not yet done. Will discuss case with neurology, and then likely proceed with admission for her AKI.  3:29 PM Dr. Leonel Ramsay of neurology returning page, says it could be myoclonus from the gabapentin, vs seizure so EEG would be warranted, will order this now. He will consult on pt while she's here. Will proceed with admission for AKI and further work up of these tremors.   3:49 PM  Remainder of work up still pending/not yet done. Dr. Karleen Hampshire of [facility] returning page and will admit. Holding orders to be placed by admitting team. Please see their notes for further documentation of care. I appreciate their help with this pleasant pt's care. Pt stable at time of admission.    Final Clinical Impressions(s) / ED Diagnoses   Final diagnoses:  Intermittent tremor  AKI (acute kidney injury) ([place])  Decreased urine output  Cough  Decreased appetite    ED Discharge Orders    [address] [place], [place] 07/18/17 1549    Duffy Bruce, MD 07/19/17 [phone]

## 2017-07-18 NOTE — ED Notes (Signed)
[place] and brief applied to patient at this time in attempt to obtain a urine sample.

## 2017-07-18 NOTE — ED Notes (Signed)
Bed: [facility]80 Expected date:  Expected time:  Means of arrival:  Comments: 84 yo tremors

## 2017-07-18 NOTE — ED Triage Notes (Signed)
Transported by [facility] from home--tremors started 3 days ago, was seen at [facility] on 5/3 for same. Consult was placed to neurology and patient has appointment scheduled for this week. Granddaughter called EMS for further evaluation. Hx of dementia, EMS reports pt is AAO x 4.

## 2017-07-18 NOTE — H&P (Signed)
History and Physical    [name] NKN:[id number] DOB: Jun 30, 1933 DOA: 07/18/2017  PCP: Leeroy Cha, MD  Patient coming from: Home.  I have personally briefly reviewed patient's old medical records in [place]  Chief Complaint: tremors and generalized weakness, loss of appetite.  since 2 weeks.   HPI: [name] is a 84 y.o. female with medical history significant of hypertension, dementia, diabetes Mellitus, asthma, brought in by her family for worsening tremors and generalized weakness and loss of appetite.  Pt is a poor historian, and coulnd't provide me a detailed history. Most of the history is available through the ED notes and pt. She current denies any fevers or chills, nausea, vomiting or abdominal pain. She denies any chest pain, . Reports having a cough, and some sob on moving around. The tremors in the hands have started 2 weeks ago and are intermittent, and resolves without any intervention. She was seen in ED twice already and today she comes in for the hird time for similar complaints and in addition to that today, she has decreased urine output and generalized weakness. Lab work was remarkable for creatinine of 1.52, ammonia of 21, potassium of 5. Urine culture from 2 days ago was negative. CXR does not show any pneumonia.  She was referred to medical service for admission for acute renal failure and evaluation of tremors.    Review of Systems: pt has dementia and detailed ROS couldn't be obtained. No family at bedside.   Past Medical History:  Diagnosis Date  . Asthma   . Colostomy present ([place])    hx/notes 07/17/2010  . Dementia   . Hypertension    hx/notes 07/17/2010  . Pneumonia    recent/notes 10/08/2016  . Type II diabetes mellitus ([place])    hx/notes 07/17/2010    Past Surgical History:  Procedure Laterality Date  . ABDOMINAL HYSTERECTOMY    . BACK SURGERY    . COLOSTOMY     S/P SBO hx/notes 07/17/2010  . LUMBAR FUSION  11/2006   hx/notes  07/17/2010  . NASAL SINUS SURGERY  04/28/2002   Bilateral inferior turbinate reductions, bilateral maxillary antrotomies, bilateral total ethmoidectomies, bilateral frontal recess explorations, bilateral sphenoidotomies, Instatrac guidance./notes 07/28/2010     reports that she has never smoked. Her smokeless tobacco use includes chew. She reports that she does not drink alcohol or use drugs.  Allergies  Allergen Reactions  . Aspirin Other (See Comments)    Triggers asthma    Family History  Problem Relation Age of Onset  . Breast cancer Neg Hx      Prior to Admission medications   Medication Sig Start Date End Date Taking? Authorizing Provider  albuterol (PROVENTIL HFA;VENTOLIN HFA) 108 (90 Base) MCG/ACT inhaler Inhale 1-2 puffs into the lungs every 4 (four) hours as needed for wheezing or shortness of breath.    Yes [provider]  FLOVENT HFA 220 MCG/ACT inhaler Inhale 1 puff into the lungs 2 (two) times daily. 05/28/17  Yes [provider]  acetaminophen (TYLENOL) 325 MG tablet Take 650 mg by mouth every 6 (six) hours as needed for headache (pain).    [provider]  albuterol (PROVENTIL) (2.5 MG/3ML) 0.083% nebulizer solution Inhale 2.5 mg into the lungs as needed. 05/26/17   [provider]  alendronate (FOSAMAX) 70 MG tablet Take 70 mg by mouth every Monday.     [provider]  Calcium Citrate-Vitamin D (CALCIUM CITRATE + D) 315-250 MG-UNIT TABS Take 1  tablet by mouth daily.    [provider]  donepezil (ARICEPT) 10 MG tablet Take 10 mg by mouth at bedtime.  02/24/15   [provider]  escitalopram (LEXAPRO) 10 MG tablet Take 10 mg by mouth at bedtime. 06/16/17   [provider]  gabapentin (NEURONTIN) 300 MG capsule Take 300 mg by mouth 3 (three) times daily. 10/12/15   [provider]  glipiZIDE (GLUCOTROL XL) 5 MG 24 hr tablet Take 5 mg by mouth 2 (two) times daily with a meal.     [provider]  hydrochlorothiazide (MICROZIDE) 12.5 MG capsule Take 12.5 mg by mouth daily. 05/10/17   [provider]  losartan (COZAAR) 100 MG tablet Take 100 mg by mouth daily.    [provider]  memantine (NAMENDA XR) 28 MG CP24 24 hr capsule Take 28 mg by mouth daily.    [provider]  montelukast (SINGULAIR) 10 MG tablet Take 10 mg by mouth daily. 06/21/17   [provider]  potassium chloride (K-DUR) 10 MEQ tablet Take 10 mEq by mouth 2 (two) times daily. 05/10/17   [provider]  probenecid (BENEMID) 500 MG tablet Take 250 mg by mouth daily. 10/12/16   [provider]    Physical Exam: Vitals:   07/18/17 1530 07/18/17 1600 07/18/17 1651 07/18/17 1652  BP: (!) 144/80 (!) 154/78 (!) 155/71   Pulse: (!) 56 (!) 59 63   Resp: 14 17 20    Temp:   98.5 F (36.9 C)   TempSrc:   Oral   SpO2: 99% 98%    Weight:    73 kg (160 lb 15 oz)    Constitutional: NAD, calm, comfortable Vitals:   07/18/17 1530 07/18/17 1600 07/18/17 1651 07/18/17 1652  BP: (!) 144/80 (!) 154/78 (!) 155/71   Pulse: (!) 56 (!) 59 63   Resp: 14 17 20    Temp:   98.5 F (36.9 C)   TempSrc:   Oral   SpO2: 99% 98%    Weight:    73 kg (160 lb 15 oz)   Eyes: PERRL, lids and conjunctivae normal ENMT: Mucous membranes are dry.  Neck: normal, supple, no masses, no thyromegaly Respiratory: clear to auscultation bilaterally, no wheezing, no crackles. Normal respiratory effort. No accessory muscle use.  Cardiovascular: Regular rate and rhythm, no murmurs / rubs / gallops Abdomen: no tenderness, no masses palpated. No hepatosplenomegaly. Bowel sounds positive.  Musculoskeletal: no clubbing / cyanosis. No joint deformity upper and lower extremities. Good ROM, no contractures. Normal muscle tone.  Skin: no rashes, lesions, ulcers. No induration Neurologic: CN 2-12 grossly intact. Sensation intact  Psychiatric: alert and oriented to place and person.  Normal mood.      Labs on Admission: I have personally reviewed following labs and imaging studies  CBC: Recent Labs  Lab 07/16/17 1458 07/18/17 1338  WBC 6.2 5.5  NEUTROABS 3.2 2.9  HGB 13.3 12.9  HCT 41.9 40.4  MCV 96.1 95.7  PLT 203 580   Basic Metabolic Panel: Recent Labs  Lab 07/16/17 1458 07/18/17 1338  NA 140 139  K 5.3* 5.0  CL 109 110  CO2 23 22  GLUCOSE 118* 120*  BUN 26* 34*  CREATININE 1.23* 1.52*  CALCIUM 9.5 9.0   GFR: CrCl cannot be calculated (Unknown ideal weight.). Liver Function Tests: Recent Labs  Lab 07/18/17 1338  AST 18  ALT 10*  ALKPHOS 77  BILITOT 0.6  PROT 6.6  ALBUMIN 3.4*  No results for input(s): LIPASE, AMYLASE in the last 168 hours. Recent Labs  Lab 07/18/17 1424  AMMONIA 21   Coagulation Profile: No results for input(s): INR, PROTIME in the last 168 hours. Cardiac Enzymes: No results for input(s): CKTOTAL, CKMB, CKMBINDEX, TROPONINI in the last 168 hours. BNP (last 3 results) No results for input(s): PROBNP in the last 8760 hours. HbA1C: No results for input(s): HGBA1C in the last 72 hours. CBG: No results for input(s): GLUCAP in the last 168 hours. Lipid Profile: No results for input(s): CHOL, HDL, LDLCALC, TRIG, CHOLHDL, LDLDIRECT in the last 72 hours. Thyroid Function Tests: No results for input(s): TSH, T4TOTAL, FREET4, T3FREE, THYROIDAB in the last 72 hours. Anemia Panel: No results for input(s): VITAMINB12, FOLATE, FERRITIN, TIBC, IRON, RETICCTPCT in the last 72 hours. Urine analysis:    Component Value Date/Time   COLORURINE YELLOW 07/16/2017 2138   APPEARANCEUR CLEAR 07/16/2017 2138   LABSPEC 1.017 07/16/2017 2138   PHURINE 5.0 07/16/2017 2138   GLUCOSEU NEGATIVE 07/16/2017 2138   HGBUR MODERATE (A) 07/16/2017 2138   BILIRUBINUR NEGATIVE 07/16/2017 2138   KETONESUR NEGATIVE 07/16/2017 2138   PROTEINUR NEGATIVE 07/16/2017 2138   UROBILINOGEN 1.0 01/09/2015 1620   NITRITE NEGATIVE 07/16/2017 2138   LEUKOCYTESUR  NEGATIVE 07/16/2017 2138    Radiological Exams on Admission: Dg Chest 2 View  Result Date: 07/16/2017 CLINICAL DATA:  Shortness of breath. EXAM: CHEST - 2 VIEW COMPARISON:  Two-view chest x-ray 07/08/2017 FINDINGS: Effusion the heart is mildly enlarged to suggest. There is no edema or failure. Degenerative changes are present throughout the thoracolumbar spine. IMPRESSION: No active cardiopulmonary disease. Electronically Signed   By: San Morelle M.D.   On: 07/16/2017 22:02    EKG: Independently reviewed.   Assessment/Plan Active Problems:   AKI (acute kidney injury) ([place])  Acute kidney injury: Suspect from poor po intake, / pre renal.  Hydrate and repeat renal parameters . If they don't improve, work up for obstructive causes.    Hypertension:  Well controlled.   Dementia: - resume home aricept and namenda.   Asthma: No wheezing heard.  Albuterol as needed.    Diabetes mellitus: CBG (last 3)  Recent Labs    07/18/17 1749  GLUCAP 97   Resume SSI while in the hospital.    Tremors:  Suspect psychogenic. Appreciate neurology recommendations.  EEG ordered and pending.     DVT prophylaxis: lovenox.  Code Status:  Full code.  Family Communication: none at bedside.  Disposition Plan: pending resolution of renal failure.   Consults called: neurology.  Admission status: obs.   Hosie Poisson MD [facility] Pager [phone]  If 7PM-7AM, please contact night-coverage [email or web address] Password [facility]  07/18/2017, 5:23 PM

## 2017-07-19 ENCOUNTER — Encounter (HOSPITAL_COMMUNITY): Payer: Self-pay

## 2017-07-19 DIAGNOSIS — E860 Dehydration: Secondary | ICD-10-CM | POA: Diagnosis not present

## 2017-07-19 DIAGNOSIS — R251 Tremor, unspecified: Secondary | ICD-10-CM

## 2017-07-19 DIAGNOSIS — F0391 Unspecified dementia with behavioral disturbance: Secondary | ICD-10-CM

## 2017-07-19 DIAGNOSIS — I10 Essential (primary) hypertension: Secondary | ICD-10-CM

## 2017-07-19 DIAGNOSIS — N179 Acute kidney failure, unspecified: Secondary | ICD-10-CM | POA: Diagnosis not present

## 2017-07-19 LAB — GLUCOSE, CAPILLARY
Glucose-Capillary: 107 mg/dL — ABNORMAL HIGH (ref 65–99)
Glucose-Capillary: 136 mg/dL — ABNORMAL HIGH (ref 65–99)

## 2017-07-19 LAB — BASIC METABOLIC PANEL
Anion gap: 7 (ref 5–15)
BUN: 28 mg/dL — ABNORMAL HIGH (ref 6–20)
CHLORIDE: 113 mmol/L — AB (ref 101–111)
CO2: 19 mmol/L — AB (ref 22–32)
CREATININE: 1.21 mg/dL — AB (ref 0.44–1.00)
Calcium: 8.5 mg/dL — ABNORMAL LOW (ref 8.9–10.3)
GFR calc Af Amer: 47 mL/min — ABNORMAL LOW (ref >60)
GFR calc non Af Amer: 41 mL/min — ABNORMAL LOW (ref >60)
Glucose, Bld: 100 mg/dL — ABNORMAL HIGH (ref 65–99)
POTASSIUM: 4.6 mmol/L (ref 3.5–5.1)
Sodium: 139 mmol/L (ref 135–145)

## 2017-07-19 MED ORDER — ENOXAPARIN SODIUM 40 MG/0.4ML ~~LOC~~ SOLN: 40.0000 mg | SUBCUTANEOUS | Status: DC
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
## 2017-07-19 NOTE — Progress Notes (Signed)
Noted many family in & out of patients rm asking questions- patient will say its ok to talk to the family if questions are asked. I have contacted the [facility] agency that will follow in the home to have the social worker to talk to patient/family to address concerns-[facility] rep Ronalee Belts aware. No further CM needs.Please see all prior notes.

## 2017-07-19 NOTE — Care Management Obs Status (Signed)
[place] NOTIFICATION   Patient Details  Name: [name] MRN: [id number] Date of Birth: 19-Jan-1934   Medicare Observation Status Notification Given:  Yes    MahabirJuliann Pulse, RN 07/19/2017, 1:35 PM

## 2017-07-19 NOTE — Progress Notes (Signed)
Patient has been discharged and will be picked up by [facility]. D/C paperwork given and explained with no questions asked by patient. Family in and out and are very impulsive about patients diagnosis and treatment plan stating "do you see those tremors? This is not normal". I educated and reinforced the plan for discharge and instructions to patient and family at bedside.

## 2017-07-19 NOTE — Evaluation (Signed)
Occupational Therapy Evaluation Patient Details Name: [name] MRN: [id number] DOB: Mar 27, 1933 Today's Date: 07/19/2017    History of Present Illness 84 yo female admitted with AKI, tremors-possibly psychogenic per neurology. Hx of dementia, colostomy, Dm, lumbar fusion   Clinical Impression   Pt planning to leave this day.  Recommend the below.    Follow Up Recommendations  Home health OT;Supervision/Assistance - 24 hour          Precautions / Restrictions Precautions Precautions: Fall Precaution Comments: colostomy Restrictions Weight Bearing Restrictions: No      Mobility Bed Mobility Overal bed mobility: Needs Assistance Bed Mobility: Supine to Sit     Supine to sit: Supervision;HOB elevated     General bed mobility comments: pt in chair  Transfers Overall transfer level: Needs assistance Equipment used: Rolling walker (2 wheeled) Transfers: Sit to/from [facility] Sit to Stand: Supervision Stand pivot transfers: Supervision       General transfer comment: close guard for safety. stand pivot, bed to bsc, with RW.         ADL either performed or assessed with clinical judgement   ADL Overall ADL's : Needs assistance/impaired Eating/Feeding: Set up;Sitting   Grooming: Set up;Sitting   Upper Body Bathing: Cueing for safety;Cueing for sequencing;Supervision/ safety   Lower Body Bathing: Minimal assistance;Sit to/from stand;Cueing for sequencing;Cueing for safety   Upper Body Dressing : Set up;Sitting   Lower Body Dressing: Minimal assistance;Sit to/from stand;Cueing for sequencing;Cueing for safety   Toilet Transfer: RW;Comfort height toilet;Min guard   Toileting- Clothing Manipulation and Hygiene: Min guard;Sit to/from stand;Cueing for sequencing;Cueing for safety       Functional mobility during ADLs: Minimal assistance General ADL Comments: pt reports family will A as needed     Vision Patient Visual Report: No change  from baseline              Pertinent Vitals/Pain Pain Assessment: No/denies pain     Hand Dominance Right   Extremity/Trunk Assessment         Cervical / Trunk Assessment Cervical / Trunk Assessment: Kyphotic   Communication Communication Communication: No difficulties   Cognition Arousal/Alertness: Awake/alert Behavior During Therapy: WFL for tasks assessed/performed Overall Cognitive Status: History of cognitive impairments - at baseline                                 General Comments: pt participated well. followed commands. emptied her colostomy bag with supervision from nursing.   General Comments       Exercises     Shoulder Instructions      Home Living Family/patient expects to be discharged to:: Private residence Living Arrangements: Other relatives;Children Available Help at Discharge: Family;Available 24 hours/day("mostly 24 hours/day" per granddaughter) Type of Home: House Home Access: Stairs to enter [employer] of Steps: 4 Entrance Stairs-Rails: Right;Left Home Layout: One level     Bathroom Shower/Tub: Tub/shower unit         Home Equipment: Environmental consultant - 2 wheels;Shower seat          Prior Functioning/Environment Level of Independence: Needs assistance  Gait / Transfers Assistance Needed: walks with walker ADL's / Homemaking Assistance Needed: supervision seated for bathing and dressing-pt sponge bathes, family does homemaking            OT Problem List: Decreased strength;Decreased activity tolerance;Impaired balance (sitting and/or standing)      OT Treatment/Interventions:  OT Goals(Current goals can be found in the care plan section) Acute Rehab OT Goals Patient Stated Goal: home today OT Goal Formulation: With patient  OT Frequency: Min 2X/week   Barriers to D/C:            Co-evaluation              AM-PAC PT "6 Clicks" Daily Activity     Outcome Measure Help from another person  eating meals?: None Help from another person taking care of personal grooming?: None Help from another person toileting, which includes using toliet, bedpan, or urinal?: A Little Help from another person bathing (including washing, rinsing, drying)?: None Help from another person to put on and taking off regular upper body clothing?: None Help from another person to put on and taking off regular lower body clothing?: A Little 6 Click Score: 22   End of Session Nurse Communication: Mobility status  Activity Tolerance: Patient tolerated treatment well Patient left: in chair  OT Visit Diagnosis: Unsteadiness on feet (R26.81);History of falling (Z91.81);Muscle weakness (generalized) (M62.81)                Time: 2947-6546 OT Time Calculation (min): 18 min Charges:  OT General Charges $OT Visit: 1 Visit OT Evaluation $OT Eval Moderate Complexity: 1 Mod G-Codes:     Kari Baars, [place]  Payton Mccallum D 07/19/2017, 3:54 PM

## 2017-07-19 NOTE — Care Management Note (Signed)
Case Management Note  Patient Details  Name: [name] MRN: [id number] Date of Birth: Apr 16, 1933  Subjective/Objective: Noted patient was already referred w/SOC [facility]-contacted [facility] rep Ronalee Belts informed of referral from 07/16/17-he will process.Patient voiced understanding of [facility] already referred. Attempted to contact grand dtr [name] but no answer on phone.No further CM needs.                   Action/Plan:d/c home w/HHC/[facility]   Expected Discharge Date:                  Expected Discharge Plan:  [place]  In-House Referral:     Discharge planning Services  CM Consult  Post Acute Care Choice:    Choice offered to:  Patient  DME Arranged:    DME Agency:     HH Arranged:  RN, PT, OT, Nurse's Aide, Social Work [employer] Agency:  [facility] [employer]  Status of Service:  Completed, signed off  If discussed at [employer], dates discussed:    Additional Comments:  Dessa Phi, RN 07/19/2017, 1:30 PM

## 2017-07-19 NOTE — Care Management Note (Signed)
Case Management Note  Patient Details  Name: [name] MRN: [id number] Date of Birth: 09/01/1933  Subjective/Objective:  PT recc HHPT;24hr supv. Spoke to patient & grand dtr Tamica in rm per patient permission-patient chose [facility] for HHC(informed-intermittent services);informed of private duty custodial level care(24hr supv)/out of pocket cost-patient/grand dtr voiced understanding-they said they do not have the money to pay for it. Informed that if bedside commode needed-it is an out of pocket cost-they do not have the money. Grand dtr states she have children & cannot take care of the patient. Recommended-family,friends,church family to come & visit with patient periodically-patient/grand dtr voiced understanding. Will need [facility] ambulance non emergency transp for home. [facility] forms in chart for nurse to call when ready. No further CM needs.                 Action/Plan:d/c home w/HHC/[facility].   Expected Discharge Date:                  Expected Discharge Plan:  [place]  In-House Referral:     Discharge planning Services  CM Consult  Post Acute Care Choice:    Choice offered to:  Patient  DME Arranged:    DME Agency:     HH Arranged:  RN, PT, OT, Nurse's Aide, Social Work [employer] Agency:  [facility]  Status of Service:  Completed, signed off  If discussed at [employer], dates discussed:    Additional Comments:  Dessa Phi, RN 07/19/2017, 1:12 PM

## 2017-07-19 NOTE — Progress Notes (Signed)
Initial Nutrition Assessment  DOCUMENTATION CODES:   Not applicable  INTERVENTION:  - Continue to encourage PO intakes.  - RD will monitor for needs if pt unable to d/c today.   NUTRITION DIAGNOSIS:   Inadequate oral intake related to acute illness, decreased appetite as evidenced by per patient/family report.  GOAL:   Patient will meet greater than or equal to 90% of their needs  MONITOR:   PO intake, Weight trends, Labs  REASON FOR ASSESSMENT:   Consult Assessment of nutrition requirement/status  ASSESSMENT:   84 y.o. female with medical history significant of hypertension, dementia, diabetes Mellitus, asthma, brought in by her family for worsening tremors and generalized weakness and loss of appetite.   BMI indicates overweight status, appropriate for age. No intakes documented since admission. Family previously in the room and has since left. RD talked with Case Manager briefly about pt; Case Manager states family to go home via [facility] today. Family had reported to providers that pt has had a decreased appetite recently.  Pt has not had any abdominal pain or nausea since admission or PTA. She was having hand tremors that began ~2 weeks ago and occurred intermittently. Unable to determine if this affected ability to eat and therefore appetite and intakes. Per chart review, weight has been stable for the past 10 months, trending up prior to that.  Medications reviewed; sliding scale Novolog.  Labs reviewed; Cl: 113 mmol/L, BUN: 28 mg/dL, creatinine: 1.21 mg/dL, Ca: 8.5 mg/dL, GFR: 47 mL/min.   IVF: NS @ 75 mL/hr.       NUTRITION - FOCUSED PHYSICAL EXAM:  Completed/assessed; no muscle and no fat wasting noted.   Diet Order:   Diet Order           Diet Carb Modified Fluid consistency: Thin; Room service appropriate? Yes  Diet effective now          EDUCATION NEEDS:   No education needs have been identified at this time  Skin:  Skin Assessment: Reviewed RN  Assessment  Last BM:  5/6  Height:   Ht Readings from Last 1 Encounters:  07/18/17 5\' 2"  (1.575 m)    Weight:   Wt Readings from Last 1 Encounters:  07/18/17 160 lb 15 oz (73 kg)    Ideal Body Weight:  50 kg  BMI:  Body mass index is 29.44 kg/m.  Estimated Nutritional Needs:   Kcal:  1315-1605 (18-22 kcal/kg)  Protein:  60-70 grams  Fluid:  >/= 1.5 L/day    Jarome Matin, MS, RD, LDN, [facility] Inpatient Clinical Dietitian Pager # [phone] After hours/weekend pager # [phone]

## 2017-07-19 NOTE — Evaluation (Signed)
Physical Therapy Evaluation Patient Details Name: [name] MRN: [id number] DOB: 15-Apr-1933 Today's Date: 07/19/2017   History of Present Illness  84 yo female admitted with AKI, tremors-possibly psychogenic per neurology. Hx of dementia, colostomy, Dm, lumbar fusion    Clinical Impression  On eval, pt was Min guard assist for mobility. She walked ~115 feet with a RW. Pt participated well. Noticed tremors x 1 during session. Family was present during eval. Pt denied dizziness or weakness during session. Recommend HHPT (1-2 sessions to ensure safe transition back into home environment) if pt/family are agreeable. Family reported pt has "mostly" 24/7 supervision. Will follow and progress activity as tolerated.     Follow Up Recommendations Home health PT;Supervision/Assistance - 24 hour    Equipment Recommendations  None recommended by PT    Recommendations for Other Services       Precautions / Restrictions Precautions Precautions: Fall Precaution Comments: colostomy Restrictions Weight Bearing Restrictions: No      Mobility  Bed Mobility Overal bed mobility: Needs Assistance Bed Mobility: Supine to Sit     Supine to sit: Supervision;HOB elevated     General bed mobility comments: for safety. pt used bedrail  Transfers Overall transfer level: Needs assistance Equipment used: Rolling walker (2 wheeled) Transfers: Sit to/from [facility] Sit to Stand: Min guard Stand pivot transfers: Min guard       General transfer comment: close guard for safety. stand pivot, bed to bsc, with RW.   Ambulation/Gait Ambulation/Gait assistance: Min guard Ambulation Distance (Feet): 115 Feet Assistive device: Rolling walker (2 wheeled) Gait Pattern/deviations: Trunk flexed     General Gait Details: close guard for safety. Pt denied dizziness, weakness. She tolerated distance well. No LOB with RW use.   Stairs            Wheelchair Mobility     Modified Rankin (Stroke Patients Only)       Balance Overall balance assessment: Needs assistance         Standing balance support: Bilateral upper extremity supported Standing balance-Leahy Scale: Poor                               Pertinent Vitals/Pain Pain Assessment: No/denies pain    Home Living Family/patient expects to be discharged to:: Private residence Living Arrangements: Other relatives;Children Available Help at Discharge: Family;Available 24 hours/day("mostly 24 hours/day" per granddaughter) Type of Home: House Home Access: Stairs to enter Entrance Stairs-Rails: Psychiatric nurse of Steps: 4 Home Layout: One level Home Equipment: Walker - 2 wheels;Shower seat      Prior Function Level of Independence: Needs assistance   Gait / Transfers Assistance Needed: walks with walker  ADL's / Homemaking Assistance Needed: supervision seated for bathing and dressing-pt sponge bathes, family does homemaking        Hand Dominance   Dominant Hand: Right    Extremity/Trunk Assessment   Upper Extremity Assessment Upper Extremity Assessment: Generalized weakness    Lower Extremity Assessment Lower Extremity Assessment: Generalized weakness    Cervical / Trunk Assessment Cervical / Trunk Assessment: Kyphotic  Communication   Communication: No difficulties  Cognition Arousal/Alertness: Awake/alert Behavior During Therapy: WFL for tasks assessed/performed Overall Cognitive Status: History of cognitive impairments - at baseline                                 General Comments:  pt participated well. followed commands. emptied her colostomy bag with supervision from nursing.      General Comments      Exercises     Assessment/Plan    PT Assessment Patient needs continued PT services  PT Problem List Decreased mobility       PT Treatment Interventions DME instruction;Gait training;Functional mobility  training;Patient/family education;Therapeutic exercise    PT Goals (Current goals can be found in the Care Plan section)  Acute Rehab PT Goals Patient Stated Goal: none stated PT Goal Formulation: With patient/family Time For Goal Achievement: 08/02/17 Potential to Achieve Goals: Good    Frequency Min 3X/week   Barriers to discharge        Co-evaluation               AM-PAC PT "6 Clicks" Daily Activity  Outcome Measure Difficulty turning over in bed (including adjusting bedclothes, sheets and blankets)?: A Little Difficulty moving from lying on back to sitting on the side of the bed? : A Little Difficulty sitting down on and standing up from a chair with arms (e.g., wheelchair, bedside commode, etc,.)?: A Little Help needed moving to and from a bed to chair (including a wheelchair)?: A Little Help needed walking in hospital room?: A Little Help needed climbing 3-5 steps with a railing? : A Little 6 Click Score: 18    End of Session Equipment Utilized During Treatment: Gait belt Activity Tolerance: Patient tolerated treatment well Patient left: in chair;with call bell/phone within reach;with chair alarm set;with family/visitor present   PT Visit Diagnosis: Difficulty in walking, not elsewhere classified (R26.2)    Time: 1655-3748 PT Time Calculation (min) (ACUTE ONLY): 25 min   Charges:   PT Evaluation $PT Eval Moderate Complexity: 1 Mod PT Treatments $Gait Training: 8-22 mins   PT G Codes:          Weston Anna, MPT Pager: [phone]

## 2017-07-19 NOTE — Progress Notes (Signed)
Pharmacist-Provider Communication:   Given CrCl > 30 mL/min, increased dose of enoxaparin from 30 mg subQ daily to 40 mg daily dose per protocol for DVT prophylaxis.   Lenis Noon, PharmD, BCPS Clinical Pharmacist 07/19/17 8:20 AM

## 2017-07-26 ENCOUNTER — Encounter: Payer: Self-pay | Admitting: Neurology
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
## 2017-07-28 ENCOUNTER — Ambulatory Visit (INDEPENDENT_AMBULATORY_CARE_PROVIDER_SITE_OTHER): Payer: Medicare Other | Admitting: Neurology

## 2017-07-28 ENCOUNTER — Telehealth: Payer: Self-pay | Admitting: Psychology

## 2017-07-28 ENCOUNTER — Encounter: Payer: Self-pay | Admitting: Neurology

## 2017-07-28 VITALS — BP 124/80 | HR 62 | Ht 60.0 in | Wt 158.0 lb

## 2017-07-28 DIAGNOSIS — F0281 Dementia in other diseases classified elsewhere with behavioral disturbance: Secondary | ICD-10-CM

## 2017-07-28 DIAGNOSIS — F445 Conversion disorder with seizures or convulsions: Secondary | ICD-10-CM | POA: Diagnosis not present

## 2017-07-28 DIAGNOSIS — G301 Alzheimer's disease with late onset: Secondary | ICD-10-CM | POA: Diagnosis not present

## 2017-07-28 DIAGNOSIS — R32 Unspecified urinary incontinence: Secondary | ICD-10-CM | POA: Diagnosis not present

## 2017-07-28 NOTE — Patient Instructions (Signed)
The phone number for behavioral health is [phone].

## 2017-07-28 NOTE — Telephone Encounter (Signed)
Telephone call the patient's granddaughter Tomeka.  I offered to schedule a time to meet with her to go over some strategies to help with her grandmother and/or to provide some resources for this family.  The patient's granddaughter does not drive so she is unable to meet with me.  She currently has a headache so today is not a good time to talk.  The plan is that I will call her tomorrow after 11 and before 3:00 before her kids get home.  We will talk about small things that the family might be able to do that can help calm and distract her grandmother when she is stressed.  In addition, I will provide resources via phone for low to no cost counseling.  I will provide them with the following resources via mail: [employer] transportation information since the granddaughter is the primary caregiver for the grandmother and is not independent on transportation.  I will also provide a note for the granddaughter to take to a counselor that would help explain the referral for counseling and what this family primarily needs so the counselor will have some guidance.  I will also provide information on counseling that is free/sliding scale in our community.  The patient's granddaughter continues to be connected to the senior resource center as they had a great caregiver resource line.  In addition, I will provide some resources on dementia and caregiving.   Social needs identified: Low health literacy, transportation needs, multiple family members living in the home creating a chaotic environment, low income.  I will be mindful of these needs when providing resources to the family.  Strengths: Commitment within the family, especially the granddaughter to care for their grandmother.

## 2017-07-29 ENCOUNTER — Telehealth: Payer: Self-pay | Admitting: Psychology

## 2017-07-29 NOTE — Telephone Encounter (Signed)
Telephone call with patient's granddaughter.  Provided the following resources.   The day advantage program that has a sliding scale fee/financial assistance.  This would provide the patient an opportunity to be exposed to an adult day program that would be stimulating during the day while giving the patient's granddaughter some respite.  Tax adviser.  The patient's granddaughter stated that they currently are utilizing the Meals on Wheels program and are enrolled into the senior wheels transportation program.  I encouraged her to call that number back and to speak about caregiver support as there is programs and resources she can be enrolled in from that perspective.  Alzheimer[employer] direct connect line.  Provided this information for resources and connections to support groups for caregivers of patients living with dementia.  In addition, the 24-hour day 7-day a week helpline would be helpful for the caregiver that would be a good resource for connection on dementia information.  I provided for names counseling resources for the granddaughter that are sliding scale/financial assistance based.  These resources are [facility] of the [place],  and the [employer].    I will put information in the mail on all of these resources.

## 2017-08-03 ENCOUNTER — Ambulatory Visit: Payer: Medicare Other | Admitting: Neurology

## 2017-08-17 ENCOUNTER — Ambulatory Visit (INDEPENDENT_AMBULATORY_CARE_PROVIDER_SITE_OTHER): Payer: Medicare Other | Admitting: Pulmonary Disease

## 2017-08-17 ENCOUNTER — Other Ambulatory Visit (INDEPENDENT_AMBULATORY_CARE_PROVIDER_SITE_OTHER): Payer: Medicare Other

## 2017-08-17 ENCOUNTER — Encounter: Payer: Self-pay | Admitting: Pulmonary Disease

## 2017-08-17 VITALS — BP 126/76 | HR 71 | Ht 61.0 in | Wt 134.0 lb

## 2017-08-17 DIAGNOSIS — J45909 Unspecified asthma, uncomplicated: Secondary | ICD-10-CM | POA: Diagnosis not present

## 2017-08-17 LAB — CBC WITH DIFFERENTIAL/PLATELET
BASOS PCT: 2.3 % (ref 0.0–3.0)
Basophils Absolute: 0.2 10*3/uL — ABNORMAL HIGH (ref 0.0–0.1)
EOS PCT: 5.4 % — AB (ref 0.0–5.0)
Eosinophils Absolute: 0.4 10*3/uL (ref 0.0–0.7)
HEMATOCRIT: 40.4 % (ref 36.0–46.0)
HEMOGLOBIN: 13.3 g/dL (ref 12.0–15.0)
LYMPHS PCT: 31.7 % (ref 12.0–46.0)
Lymphs Abs: 2.1 10*3/uL (ref 0.7–4.0)
MCHC: 33 g/dL (ref 30.0–36.0)
MCV: 92.5 fl (ref 78.0–100.0)
MONOS PCT: 7.2 % (ref 3.0–12.0)
Monocytes Absolute: 0.5 10*3/uL (ref 0.1–1.0)
Neutro Abs: 3.5 10*3/uL (ref 1.4–7.7)
Neutrophils Relative %: 53.4 % (ref 43.0–77.0)
Platelets: 220 10*3/uL (ref 150.0–400.0)
RBC: 4.37 Mil/uL (ref 3.87–5.11)
RDW: 13.3 % (ref 11.5–15.5)
WBC: 6.6 10*3/uL (ref 4.0–10.5)

## 2017-08-17 MED ORDER — BUDESONIDE 0.5 MG/2ML IN SUSP: 0.5000 mg | Freq: Two times a day (BID) | RESPIRATORY_TRACT | 5 refills | Status: DC

## 2017-08-17 MED ORDER — ARFORMOTEROL TARTRATE 15 MCG/2ML IN NEBU: 15.0000 ug | INHALATION_SOLUTION | Freq: Two times a day (BID) | RESPIRATORY_TRACT | 5 refills | Status: DC

## 2017-08-17 NOTE — Progress Notes (Addendum)
[name]    [id number]    19-Apr-1933  Primary Care Physician:Varadarajan, Ronie Spies, MD  Referring Physician: Leeroy Cha, MD [address] [place], [place] [address]  Chief complaint: Consult for asthma  HPI: 84 year old with history of hypertension, asthma, diabetes, allergy, chronic kidney disease, psychogenic seizure, Alzheimer's dementia She has a history of asthma and is maintained on Flovent and albuterol.  She has increasing problems with dyspnea, wheezing, nonproductive cough.  Seen by primary care in March 2019 and given a prednisone taper She has an albuterol inhaler that she is using 4 times a day.  She has seasonal allergies.  Denies any GERD symptoms.  She had a recent admission in early May 2019 for acute renal failure and abnormal movements which were thought to be secondary to conversion disorder, stress.  She has been evaluated by neurology as an outpatient and has a diagnosis of psychogenic seizures.  Pets: No pets Occupation: Retired Exposures: No known exposures, no mold, Jacuzzi, hot tub.  Carpets at home which are kept clean. Smoking history: Never smoker.  Used chewing tobacco, snuff Travel history: No significant travel history Relevant family history: Significant family history of lung disease.  Outpatient Encounter Medications as of 08/17/2017  Medication Sig  . acetaminophen (TYLENOL) 325 MG tablet Take 650 mg by mouth every 6 (six) hours as needed for headache (pain).  Marland Kitchen albuterol (PROVENTIL HFA;VENTOLIN HFA) 108 (90 Base) MCG/ACT inhaler Inhale 1-2 puffs into the lungs every 4 (four) hours as needed for wheezing or shortness of breath.   Marland Kitchen albuterol (PROVENTIL) (2.5 MG/3ML) 0.083% nebulizer solution Inhale 2.5 mg into the lungs as needed.  Marland Kitchen alendronate (FOSAMAX) 70 MG tablet Take 70 mg by mouth every Monday.   . Calcium Citrate-Vitamin D (CALCIUM CITRATE + D) 315-250 MG-UNIT TABS Take 1 tablet by mouth daily.  Marland Kitchen  donepezil (ARICEPT) 10 MG tablet Take 10 mg by mouth at bedtime.   Marland Kitchen escitalopram (LEXAPRO) 10 MG tablet Take 10 mg by mouth at bedtime.  Marland Kitchen FLOVENT HFA 220 MCG/ACT inhaler Inhale 1 puff into the lungs 2 (two) times daily.  Marland Kitchen gabapentin (NEURONTIN) 300 MG capsule Take 300 mg by mouth 3 (three) times daily.  Marland Kitchen losartan (COZAAR) 100 MG tablet Take 100 mg by mouth daily.  . memantine (NAMENDA XR) 28 MG CP24 24 hr capsule Take 28 mg by mouth daily.  . montelukast (SINGULAIR) 10 MG tablet Take 10 mg by mouth daily.  . probenecid (BENEMID) 500 MG tablet Take 250 mg by mouth daily.   No facility-administered encounter medications on file as of 08/17/2017.     Allergies as of 08/17/2017 - Review Complete 08/17/2017  Allergen Reaction Noted  . Aspirin Other (See Comments) 09/05/2016    Past Medical History:  Diagnosis Date  . Asthma   . Colostomy present ([place])    hx/notes 07/17/2010  . Dementia   . Diabetic neuropathy ([place])   . Hypertension    hx/notes 07/17/2010  . Pneumonia    recent/notes 10/08/2016  . Type II diabetes mellitus ([place])    hx/notes 07/17/2010    Past Surgical History:  Procedure Laterality Date  . ABDOMINAL HYSTERECTOMY    . COLOSTOMY     S/P SBO hx/notes 07/17/2010  . LUMBAR FUSION  11/2006   hx/notes 07/17/2010  . NASAL SINUS SURGERY  04/28/2002   Bilateral inferior turbinate reductions, bilateral maxillary antrotomies, bilateral total ethmoidectomies, bilateral frontal recess explorations, bilateral sphenoidotomies, Instatrac  guidance./notes 07/28/2010    Family History  Problem Relation Age of Onset  . Kidney disease Mother   . Hypertension Mother   . Other Father        gangrene with amputation  . Diabetes Sister   . Cancer Brother   . Heart disease Brother   . Schizophrenia Daughter   . Diabetes Daughter   . Stroke Daughter   . Breast cancer Neg Hx     Social History   Socioeconomic History  . Marital status: Single    Spouse name: Not on file  . Number  of children: Not on file  . Years of education: Not on file  . Highest education level: Not on file  Occupational History  . Not on file  Social Needs  . Financial resource strain: Not on file  . Food insecurity:    Worry: Not on file    Inability: Not on file  . Transportation needs:    Medical: Not on file    Non-medical: Not on file  Tobacco Use  . Smoking status: Never Smoker  . Smokeless tobacco: Former Systems developer    Types: Chew  Substance and Sexual Activity  . Alcohol use: No  . Drug use: No  . Sexual activity: Never  Lifestyle  . Physical activity:    Days per week: Not on file    Minutes per session: Not on file  . Stress: Not on file  Relationships  . Social connections:    Talks on phone: Not on file    Gets together: Not on file    Attends religious service: Not on file    Active member of club or organization: Not on file    Attends meetings of clubs or organizations: Not on file    Relationship status: Not on file  . Intimate partner violence:    Fear of current or ex partner: Not on file    Emotionally abused: Not on file    Physically abused: Not on file    Forced sexual activity: Not on file  Other Topics Concern  . Not on file  Social History Narrative  . Not on file    Review of systems: Review of Systems  Constitutional: Negative for fever and chills.  HENT: Negative.   Eyes: Negative for blurred vision.  Respiratory: as per HPI  Cardiovascular: Negative for chest pain and palpitations.  Gastrointestinal: Negative for vomiting, diarrhea, blood per rectum. Genitourinary: Negative for dysuria, urgency, frequency and hematuria.  Musculoskeletal: Negative for myalgias, back pain and joint pain.  Skin: Negative for itching and rash.  Neurological: Negative for dizziness, tremors, focal weakness, seizures and loss of consciousness.  Endo/Heme/Allergies: Negative for environmental allergies.  Psychiatric/Behavioral: Negative for depression, suicidal  ideas and hallucinations.  All other systems reviewed and are negative.  Physical Exam: Blood pressure 126/76, pulse 71, height 5\' 1"  (1.549 m), weight 134 lb (60.8 kg), SpO2 95 %. Gen:      No acute distress HEENT:  EOMI, sclera anicteric Neck:     No masses; no thyromegaly Lungs:    Clear to auscultation bilaterally; normal respiratory effort CV:         Regular rate and rhythm; no murmurs Abd:      + bowel sounds; soft, non-tender; no palpable masses, no distension Ext:    No edema; adequate peripheral perfusion Skin:      Warm and dry; no rash Neuro: alert and oriented x 3 Psych: normal mood and affect  Data Reviewed: FENO 08/17/2017-unable to complete.  Chest x-ray 07/16/2017-mild cardiac enlargement.  Degenerative changes in the thoracolumbar spine.  No acute lung abnormality I have reviewed the images personally.  CBC 07/18/2017-WBC 5.5, eos 8%, absolute eosinophil count 440  Assessment:  Moderate persistent asthma Currently on Flovent and albuterol as needed. CBC noted for mild elevation in peripheral eosinophils Given her issues with dementia I am not sure if she is able to get adequate delivery of inhalers into the lungs We will stop Flovent and try Brovana and Pulmicort nebulizer Continue albuterol as needed  Check pulmonary function test, CBC differential and blood allergy profile.  Health maintenance Influenza vaccine and pneumonia vaccine declined by the patient as per primary care note.  Plan/Recommendations: - Stop Flovent - Start Brovana, Pulmicort nebs - Continue albuterol as needed - PFTs, CBC, blood allergy profile.  Marshell Garfinkel MD [facility] 08/17/2017, 11:59 AM  CC: Leeroy Cha,*

## 2017-08-17 NOTE — Patient Instructions (Signed)
We will check PFTs, CBC differential, blood allergy profile We will try you on Brovana and Pulmicort nebs instead of the Flovent Follow-up in 1 to 2 months.

## 2017-08-18 LAB — RESPIRATORY ALLERGY PROFILE REGION II ~~LOC~~
Allergen, A. alternata, m6: <0.1 kU/L
Allergen, Comm Silver Birch, t9: <0.1 kU/L
Allergen, Cottonwood, t14: <0.1 kU/L
Bermuda Grass: <0.1 kU/L
Box Elder IgE: <0.1 kU/L
CLADOSPORIUM HERBARUM (M2) IGE: <0.1 kU/L
CLASS: 0
CLASS: 0
CLASS: 0
CLASS: 0
CLASS: 0
CLASS: 0
CLASS: 0
CLASS: 0
CLASS: 0
CLASS: 0
CLASS: 0
CLASS: 0
CLASS: 0
CLASS: 0
CLASS: 0
COMMON RAGWEED (SHORT) (W1) IGE: <0.1 kU/L
Cat Dander: <0.1 kU/L
Class: 0
Class: 0
Class: 0
Class: 0
Class: 0
Class: 0
Class: 0
Class: 0
Class: 0
Dog Dander: <0.1 kU/L
Elm IgE: <0.1 kU/L
IgE (Immunoglobulin E), Serum: 313 kU/L — ABNORMAL HIGH (ref <=114)
Johnson Grass: <0.1 kU/L
Pecan/Hickory Tree IgE: <0.1 kU/L
Sheep Sorrel IgE: <0.1 kU/L

## 2017-08-18 LAB — INTERPRETATION:

## 2017-08-23 ENCOUNTER — Telehealth: Payer: Self-pay | Admitting: Pulmonary Disease

## 2017-08-23 NOTE — Telephone Encounter (Signed)
Did they try having brovana run through medicare part B?  Also can check if performist would be less expensive option.

## 2017-08-23 NOTE — Telephone Encounter (Signed)
Spoke with pharmacist, she stated that the patient's medication was only ran through Part D. They do not have her Part B information on file. Looked in patient's chart, did not see a copy of patient's traditional medicare card. Pharmacist stated that patient or patient's representative can call the store with this information.   Spoke with [name] again. She stated that she believes she has the patient's other Medicare card. She will call [facility] with this information.   Will leave this encounter open until she calls back.

## 2017-08-23 NOTE — Telephone Encounter (Signed)
Spoke with [name]. She stated that the patient was seen on 08/17/17 by Dr. Vaughan Browner. She was prescried Brovana and Pulmicort. Per [employer], insurance will not cover the [place].   [name] wants to know if the patient can be prescribed another medication since she is in the middle of an asthma flare up now. Patient is SOB and wheezing.   Pharmacy is [facility] on [address]   Dr. Halford Chessman, please advise since Dr. Vaughan Browner is not in the office today. Thanks!

## 2017-08-24 NOTE — Telephone Encounter (Signed)
Spoke with Tameka and she states she did give the Medicare information to the pharmacy and they are waiting on a form to be faxed from our office for the nebulizer medications. I am guessing this is the form for Medicare that the pharmacy needs to process the RX. I checked all fax machines Dr. Matilde Bash cubby and I couldn't locate form. I called [facility], but was on hold for several minutes. Will try to call back later to see if they can fax the form again.

## 2017-08-24 NOTE — Telephone Encounter (Signed)
I called her again by accident but I was trying to call the pharmacy. I am waiting to call [facility] again so I can request form to be re-faxed.

## 2017-08-24 NOTE — Telephone Encounter (Signed)
Pt granddaughter [name] is calling back [phone]

## 2017-08-24 NOTE — Telephone Encounter (Signed)
Spoke with granddaughter [name], states that the pharmacy has received the pt's Medicare info, but is now needing "some kind of form" to be filled out by our office. Called [facility], was on hold over 7 minutes trying to speak to a pharmacy staff member.  Wcb.

## 2017-08-25 NOTE — Telephone Encounter (Signed)
Called and spoke to pharmacy tech and was advised they will re-fax the form to the front fax (triage fax is down). Will hold in triage till form has been received.

## 2017-08-26 NOTE — Telephone Encounter (Signed)
I have checked up front and Dr. Matilde Bash look at. This form is not located in either location. I have left a message with [facility] to have this refaxed. Will await fax.

## 2017-08-27 NOTE — Telephone Encounter (Signed)
Please check if the prescribed meds is covered by medicare part B. The order should not be through the pharmacy but through the DME company. If brovana is not covered then try performist.

## 2017-08-27 NOTE — Telephone Encounter (Signed)
OK. Thanks for checking Order budesonide nebs 0.5 mg bid Albuterol nebs every 4 hrs as needed.

## 2017-08-27 NOTE — Telephone Encounter (Signed)
Dr. Vaughan Browner this patient doesn't have medicare part B, she as AARP/Medicare and they bill Part D. She just doesn't have that part. Tameka said they tried this and they couldn't bill.

## 2017-08-27 NOTE — Telephone Encounter (Signed)
I called [facility] but was unable to speak to a pharmacy tech about the form that needed to be filled out. I called Tameka to see if they received medication and she stated they did not get the medication because she could not afford it. The Rx was 28.00 dollars but she couldn't remember which medication it was. The other medication she states wasn't covered by the insurance at all (im guessing Brovana).   I looked at pt's formulary online and it seem that these medications are Tier one and may be no copay or lower copay for pt:  Albuterol neb Levalbuterol neb Ipatropium  neb Budesonide neb   Tameka wants to  Know if they could have something cheaper?. Dr. Vaughan Browner  please advise.

## 2017-08-30 ENCOUNTER — Telehealth: Payer: Self-pay | Admitting: Pulmonary Disease

## 2017-08-30 MED ORDER — BUDESONIDE 0.5 MG/2ML IN SUSP: 0.5000 mg | Freq: Two times a day (BID) | RESPIRATORY_TRACT | 5 refills | Status: DC

## 2017-08-30 NOTE — Telephone Encounter (Signed)
Spoke with pt's granddaugher, Tamekia. She is aware of the medications that Dr. Vaughan Browner recommended. Tamekia refused albuterol. Budesonide has been sent in. Nothing further was needed.

## 2017-08-30 NOTE — Telephone Encounter (Signed)
[name], granddaughter, returning call.  CB is [phone].  She states she is going to turn her ringer up.  States they are at [facility] now, PCP, now and states her breathing is getting worse and needs med called in for this.  States cannot afford this medication.  Pharm is [facility] on [address].

## 2017-08-30 NOTE — Telephone Encounter (Signed)
Spoke with pt's granddaughter, Alverda Skeans. She is aware that this prescription was sent to [facility] on [address]. Rx has been sent in again. Nothing further was needed.

## 2017-08-30 NOTE — Telephone Encounter (Signed)
Attempted to contact pt. I did not receive an answer. There was no option for me to leave a message. Will try back.  

## 2017-08-30 NOTE — Telephone Encounter (Signed)
lmtcb

## 2017-08-30 NOTE — Telephone Encounter (Signed)
Pt's granddaughter Alverda Skeans is calling back [phone]

## 2017-09-24 ENCOUNTER — Ambulatory Visit: Payer: Self-pay | Admitting: Pulmonary Disease

## 2017-09-29 ENCOUNTER — Ambulatory Visit: Payer: Medicare Other | Admitting: Psychology

## 2017-10-11 ENCOUNTER — Ambulatory Visit: Payer: Medicare Other | Admitting: Psychology

## 2017-10-13 ENCOUNTER — Ambulatory Visit (INDEPENDENT_AMBULATORY_CARE_PROVIDER_SITE_OTHER): Payer: Medicare Other | Admitting: Pulmonary Disease

## 2017-10-13 ENCOUNTER — Encounter: Payer: Self-pay | Admitting: Pulmonary Disease

## 2017-10-13 VITALS — BP 122/78 | HR 67 | Ht 61.0 in | Wt 154.0 lb

## 2017-10-13 DIAGNOSIS — J4540 Moderate persistent asthma, uncomplicated: Secondary | ICD-10-CM | POA: Diagnosis not present

## 2017-10-13 DIAGNOSIS — J45909 Unspecified asthma, uncomplicated: Secondary | ICD-10-CM

## 2017-10-13 LAB — PULMONARY FUNCTION TEST
DL/VA % PRED: 98 %
DL/VA: 4 ml/min/mmHg/L
DLCO COR % PRED: 74 %
DLCO COR: 13.06 ml/min/mmHg
DLCO UNC % PRED: 74 %
DLCO unc: 13.02 ml/min/mmHg
FEF 25-75 PRE: 0.45 L/s
FEF 25-75 Post: 1.23 L/sec
FEF2575-%CHANGE-POST: 173 %
FEF2575-%Pred-Post: 138 %
FEF2575-%Pred-Pre: 50 %
FEV1-%CHANGE-POST: 45 %
FEV1-%PRED-PRE: 58 %
FEV1-%Pred-Post: 85 %
FEV1-Post: 0.92 L
FEV1-Pre: 0.63 L
FEV1FVC-%Change-Post: -5 %
FEV1FVC-%Pred-Pre: 96 %
FEV6-%Change-Post: 55 %
FEV6-%PRED-POST: 102 %
FEV6-%Pred-Pre: 65 %
FEV6-POST: 1.34 L
FEV6-PRE: 0.86 L
FEV6FVC-%PRED-POST: 106 %
FEV6FVC-%PRED-PRE: 106 %
FVC-%Change-Post: 53 %
FVC-%Pred-Post: 95 %
FVC-%Pred-Pre: 62 %
FVC-Post: 1.34 L
FVC-Pre: 0.87 L
PRE FEV6/FVC RATIO: 100 %
Post FEV1/FVC ratio: 68 %
Post FEV6/FVC ratio: 100 %
Pre FEV1/FVC ratio: 72 %

## 2017-10-13 MED ORDER — FORMOTEROL FUMARATE 20 MCG/2ML IN NEBU: 20.0000 ug | INHALATION_SOLUTION | Freq: Two times a day (BID) | RESPIRATORY_TRACT | 6 refills | Status: DC

## 2017-10-13 NOTE — Patient Instructions (Addendum)
Continue the Pulmicort nebulizers, albuterol as needed We will check we can get performist nebulizers otherwise start you on duo nebs 3 times daily every day Follow-up in clinic in 6 months.

## 2017-10-13 NOTE — Progress Notes (Signed)
Spirometry and Dlco done today. 

## 2017-10-13 NOTE — Progress Notes (Signed)
[name]    [id number]    15-Jan-1934  Primary Care Physician:Varadarajan, Ronie Spies, MD  Referring Physician: Leeroy Cha, MD [address] [place], [place] [address]  Chief complaint: Follow up for asthma  HPI: 84 year old with history of hypertension, asthma, diabetes, allergy, chronic kidney disease, psychogenic seizure, Alzheimer's dementia She has a history of asthma and is maintained on Flovent and albuterol.  She has increasing problems with dyspnea, wheezing, nonproductive cough.  Seen by primary care in March 2019 and given a prednisone taper She has an albuterol inhaler that she is using 4 times a day.  She has seasonal allergies.  Denies any GERD symptoms.  She had a recent admission in early May 2019 for acute renal failure and abnormal movements which were thought to be secondary to conversion disorder, stress.  She has been evaluated by neurology as an outpatient and has a diagnosis of psychogenic seizures.  Pets: No pets Occupation: Retired Exposures: No known exposures, no mold, Jacuzzi, hot tub.  Carpets at home which are kept clean. Smoking history: Never smoker.  Used chewing tobacco, snuff Travel history: No significant travel history Relevant family history: Significant family history of lung disease.  Interim history: Flovent was stopped and she was started on Pulmicort nebs at last visit States that dyspnea is stable.  Using albuterol 2-3 times a day for dyspnea, wheezing  Outpatient Encounter Medications as of 10/13/2017  Medication Sig  . acetaminophen (TYLENOL) 325 MG tablet Take 650 mg by mouth every 6 (six) hours as needed for headache (pain).  Marland Kitchen albuterol (PROVENTIL HFA;VENTOLIN HFA) 108 (90 Base) MCG/ACT inhaler Inhale 1-2 puffs into the lungs every 4 (four) hours as needed for wheezing or shortness of breath.   Marland Kitchen albuterol (PROVENTIL) (2.5 MG/3ML) 0.083% nebulizer solution Inhale 2.5 mg into the lungs as needed.    Marland Kitchen alendronate (FOSAMAX) 70 MG tablet Take 70 mg by mouth every Monday.   Marland Kitchen arformoterol (BROVANA) 15 MCG/2ML NEBU Take 2 mLs (15 mcg total) by nebulization 2 (two) times daily. DX:J45.909  . budesonide (PULMICORT) 0.5 MG/2ML nebulizer solution Take 2 mLs (0.5 mg total) by nebulization 2 (two) times daily. DX: J45.909  . Calcium Citrate-Vitamin D (CALCIUM CITRATE + D) 315-250 MG-UNIT TABS Take 1 tablet by mouth daily.  Marland Kitchen donepezil (ARICEPT) 10 MG tablet Take 10 mg by mouth at bedtime.   Marland Kitchen escitalopram (LEXAPRO) 10 MG tablet Take 10 mg by mouth at bedtime.  Marland Kitchen FLOVENT HFA 220 MCG/ACT inhaler Inhale 1 puff into the lungs 2 (two) times daily.  Marland Kitchen gabapentin (NEURONTIN) 300 MG capsule Take 300 mg by mouth 3 (three) times daily.  Marland Kitchen losartan (COZAAR) 100 MG tablet Take 100 mg by mouth daily.  . memantine (NAMENDA XR) 28 MG CP24 24 hr capsule Take 28 mg by mouth daily.  . montelukast (SINGULAIR) 10 MG tablet Take 10 mg by mouth daily.  . probenecid (BENEMID) 500 MG tablet Take 250 mg by mouth daily.   No facility-administered encounter medications on file as of 10/13/2017.     Allergies as of 10/13/2017 - Review Complete 10/13/2017  Allergen Reaction Noted  . Aspirin Other (See Comments) 09/05/2016    Past Medical History:  Diagnosis Date  . Asthma   . Colostomy present ([place])    hx/notes 07/17/2010  . Dementia   . Diabetic neuropathy ([place])   . Hypertension    hx/notes 07/17/2010  . Pneumonia    recent/notes 10/08/2016  .  Type II diabetes mellitus ([place])    hx/notes 07/17/2010    Past Surgical History:  Procedure Laterality Date  . ABDOMINAL HYSTERECTOMY    . COLOSTOMY     S/P SBO hx/notes 07/17/2010  . LUMBAR FUSION  11/2006   hx/notes 07/17/2010  . NASAL SINUS SURGERY  04/28/2002   Bilateral inferior turbinate reductions, bilateral maxillary antrotomies, bilateral total ethmoidectomies, bilateral frontal recess explorations, bilateral sphenoidotomies, Instatrac guidance./notes 07/28/2010     Family History  Problem Relation Age of Onset  . Kidney disease Mother   . Hypertension Mother   . Other Father        gangrene with amputation  . Diabetes Sister   . Cancer Brother   . Heart disease Brother   . Schizophrenia Daughter   . Diabetes Daughter   . Stroke Daughter   . Breast cancer Neg Hx     Social History   Socioeconomic History  . Marital status: Single    Spouse name: Not on file  . Number of children: Not on file  . Years of education: Not on file  . Highest education level: Not on file  Occupational History  . Not on file  Social Needs  . Financial resource strain: Not on file  . Food insecurity:    Worry: Not on file    Inability: Not on file  . Transportation needs:    Medical: Not on file    Non-medical: Not on file  Tobacco Use  . Smoking status: Never Smoker  . Smokeless tobacco: Former Systems developer    Types: Chew  Substance and Sexual Activity  . Alcohol use: No  . Drug use: No  . Sexual activity: Never  Lifestyle  . Physical activity:    Days per week: Not on file    Minutes per session: Not on file  . Stress: Not on file  Relationships  . Social connections:    Talks on phone: Not on file    Gets together: Not on file    Attends religious service: Not on file    Active member of club or organization: Not on file    Attends meetings of clubs or organizations: Not on file    Relationship status: Not on file  . Intimate partner violence:    Fear of current or ex partner: Not on file    Emotionally abused: Not on file    Physically abused: Not on file    Forced sexual activity: Not on file  Other Topics Concern  . Not on file  Social History Narrative  . Not on file    Review of systems: Review of Systems  Constitutional: Negative for fever and chills.  HENT: Negative.   Eyes: Negative for blurred vision.  Respiratory: as per HPI  Cardiovascular: Negative for chest pain and palpitations.  Gastrointestinal: Negative for  vomiting, diarrhea, blood per rectum. Genitourinary: Negative for dysuria, urgency, frequency and hematuria.  Musculoskeletal: Negative for myalgias, back pain and joint pain.  Skin: Negative for itching and rash.  Neurological: Negative for dizziness, tremors, focal weakness, seizures and loss of consciousness.  Endo/Heme/Allergies: Negative for environmental allergies.  Psychiatric/Behavioral: Negative for depression, suicidal ideas and hallucinations.  All other systems reviewed and are negative.  Physical Exam: Blood pressure 122/78, pulse 67, height 5\' 1"  (1.549 m), weight 154 lb (69.9 kg), SpO2 96 %. Gen:      No acute distress HEENT:  EOMI, sclera anicteric Neck:     No masses; no thyromegaly Lungs:  Clear to auscultation bilaterally; normal respiratory effort CV:         Regular rate and rhythm; no murmurs Abd:      + bowel sounds; soft, non-tender; no palpable masses, no distension Ext:    No edema; adequate peripheral perfusion Skin:      Warm and dry; no rash Neuro: alert and oriented x 3 Psych: normal mood and affect  Data Reviewed: PFT 10/13/2017 FVC 1.34 [95%], FEV1 0.92 [85%], F/F 68, DLCO 74% Moderate obstruction with air trapping.  Positive bronchodilator rest  FENO 08/17/2017-unable to complete.  Chest x-ray 07/16/2017-mild cardiac enlargement.  Degenerative changes in the thoracolumbar spine.  No acute lung abnormality I have reviewed the images personally.  CBC 07/18/2017-WBC 5.5, eos 8%, absolute eosinophil count 440 Blood allergy profile 08/17/2017-IgE 313, RAST panel negative  Assessment:  Moderate persistent asthma Continue pulmicort twice daily, albuterol as needed  Unable to afford Brovana nebs PFTs reviewed which show marked bronchodilator response with air-trapping.  She would benefit from optimization of her bronchodilator therapy  Will check if he can get performist nebulizers otherwise add standing duo nebs 3-4 times daily. Given her issues with  dementia I am not sure if she would do well with inhaler.  Health maintenance Influenza vaccine and pneumonia vaccine declined by the patient as per primary care note.  Plan/Recommendations: - Continue Pulmicort nebs, albuterol as needed - Start performist  Marshell Garfinkel MD [facility] 10/13/2017, 11:54 AM  CC: Leeroy Cha,*

## 2017-10-22 ENCOUNTER — Telehealth: Payer: Self-pay | Admitting: Neurology

## 2017-10-22 NOTE — Telephone Encounter (Signed)
[name] calling on behalf of the patient. She is needing to have her medication going through [facility] because she is unable to pay for it. Please Call. Thanks

## 2017-10-22 NOTE — Telephone Encounter (Signed)
Tried to call back with no answer  

## 2017-11-19 ENCOUNTER — Telehealth: Payer: Self-pay | Admitting: Pulmonary Disease

## 2017-11-19 ENCOUNTER — Other Ambulatory Visit: Payer: Self-pay | Admitting: Geriatric Medicine

## 2017-11-19 DIAGNOSIS — R29898 Other symptoms and signs involving the musculoskeletal system: Secondary | ICD-10-CM

## 2017-11-19 NOTE — Telephone Encounter (Signed)
Attempted to call pt's granddaughter, [name]. I did not receive an answer. I have left a message for [name] to return our call.

## 2017-11-22 ENCOUNTER — Ambulatory Visit
Admission: RE | Admit: 2017-11-22 | Discharge: 2017-11-22 | Disposition: A | Discharge status: Home / Self Care | Payer: Medicare Other | Source: Ambulatory Visit | Attending: Geriatric Medicine | Admitting: Geriatric Medicine

## 2017-11-22 DIAGNOSIS — R29898 Other symptoms and signs involving the musculoskeletal system: Secondary | ICD-10-CM

## 2017-11-22 MED ORDER — GADOBENATE DIMEGLUMINE 529 MG/ML IV SOLN
14.0000 mL | Freq: Once | INTRAVENOUS | Status: AC | PRN
  Administered 2017-11-22: 14 mL via INTRAVENOUS

## 2017-11-22 NOTE — Telephone Encounter (Signed)
Attempted to call pt's granddaughter, [name]. I did not receive an answer. I have left a message for [name] to return our call.

## 2017-11-23 NOTE — Telephone Encounter (Signed)
I have called all numbers provided. Unable to reach grand-daugther and unable to leave voicemail. Both numbers are out of order.

## 2017-11-24 NOTE — Telephone Encounter (Signed)
We have attempted to contact pt's granddaughter, [name] several times with no success or call back from her. Per triage protocol, message will be closed.

## 2017-12-13 ENCOUNTER — Emergency Department (HOSPITAL_COMMUNITY)
Admission: EM | Admit: 2017-12-13 | Discharge: 2017-12-13 | Disposition: A | Discharge status: Home / Self Care | Payer: Medicare Other | Attending: Emergency Medicine | Admitting: Emergency Medicine

## 2017-12-13 ENCOUNTER — Emergency Department (HOSPITAL_COMMUNITY): Payer: Medicare Other

## 2017-12-13 ENCOUNTER — Encounter (HOSPITAL_COMMUNITY): Payer: Self-pay

## 2017-12-13 DIAGNOSIS — I10 Essential (primary) hypertension: Secondary | ICD-10-CM | POA: Diagnosis not present

## 2017-12-13 DIAGNOSIS — E119 Type 2 diabetes mellitus without complications: Secondary | ICD-10-CM | POA: Diagnosis not present

## 2017-12-13 DIAGNOSIS — F0390 Unspecified dementia without behavioral disturbance: Secondary | ICD-10-CM | POA: Diagnosis not present

## 2017-12-13 DIAGNOSIS — R001 Bradycardia, unspecified: Secondary | ICD-10-CM | POA: Diagnosis not present

## 2017-12-13 DIAGNOSIS — J45909 Unspecified asthma, uncomplicated: Secondary | ICD-10-CM | POA: Insufficient documentation

## 2017-12-13 DIAGNOSIS — Z79899 Other long term (current) drug therapy: Secondary | ICD-10-CM | POA: Insufficient documentation

## 2017-12-13 DIAGNOSIS — R531 Weakness: Secondary | ICD-10-CM | POA: Diagnosis present

## 2017-12-13 LAB — URINALYSIS, ROUTINE W REFLEX MICROSCOPIC
Bacteria, UA: NONE SEEN
Bilirubin Urine: NEGATIVE
Glucose, UA: NEGATIVE mg/dL
Ketones, ur: NEGATIVE mg/dL
Leukocytes, UA: NEGATIVE
Nitrite: NEGATIVE
Protein, ur: NEGATIVE mg/dL
Specific Gravity, Urine: 1.005 (ref 1.005–1.030)
pH: 6 (ref 5.0–8.0)

## 2017-12-13 LAB — COMPREHENSIVE METABOLIC PANEL
ALT: 9 U/L (ref 0–44)
ANION GAP: 12 (ref 5–15)
AST: 18 U/L (ref 15–41)
Albumin: 3.4 g/dL — ABNORMAL LOW (ref 3.5–5.0)
Alkaline Phosphatase: 64 U/L (ref 38–126)
BUN: 23 mg/dL (ref 8–23)
CHLORIDE: 104 mmol/L (ref 98–111)
CO2: 23 mmol/L (ref 22–32)
Calcium: 9 mg/dL (ref 8.9–10.3)
Creatinine, Ser: 1.14 mg/dL — ABNORMAL HIGH (ref 0.44–1.00)
GFR calc non Af Amer: 44 mL/min — ABNORMAL LOW (ref >60)
GFR, EST AFRICAN AMERICAN: 50 mL/min — AB (ref >60)
Glucose, Bld: 130 mg/dL — ABNORMAL HIGH (ref 70–99)
POTASSIUM: 4.2 mmol/L (ref 3.5–5.1)
SODIUM: 139 mmol/L (ref 135–145)
Total Bilirubin: 0.6 mg/dL (ref 0.3–1.2)
Total Protein: 6.8 g/dL (ref 6.5–8.1)

## 2017-12-13 LAB — CBC WITH DIFFERENTIAL/PLATELET
ABS IMMATURE GRANULOCYTES: 0 10*3/uL (ref 0.0–0.1)
BASOS PCT: 1 %
Basophils Absolute: 0.1 10*3/uL (ref 0.0–0.1)
Eosinophils Absolute: 0.5 10*3/uL (ref 0.0–0.7)
Eosinophils Relative: 9 %
HEMATOCRIT: 42.2 % (ref 36.0–46.0)
Hemoglobin: 12.6 g/dL (ref 12.0–15.0)
IMMATURE GRANULOCYTES: 0 %
LYMPHS ABS: 2 10*3/uL (ref 0.7–4.0)
Lymphocytes Relative: 36 %
MCH: 29.6 pg (ref 26.0–34.0)
MCHC: 29.9 g/dL — ABNORMAL LOW (ref 30.0–36.0)
MCV: 99.3 fL (ref 78.0–100.0)
MONOS PCT: 7 %
Monocytes Absolute: 0.4 10*3/uL (ref 0.1–1.0)
NEUTROS ABS: 2.7 10*3/uL (ref 1.7–7.7)
NEUTROS PCT: 47 %
PLATELETS: 212 10*3/uL (ref 150–400)
RBC: 4.25 MIL/uL (ref 3.87–5.11)
RDW: 12.7 % (ref 11.5–15.5)
WBC: 5.6 10*3/uL (ref 4.0–10.5)

## 2017-12-13 LAB — I-STAT TROPONIN, ED: Troponin i, poc: 0 ng/mL (ref 0.00–0.08)

## 2017-12-13 MED ORDER — SODIUM CHLORIDE 0.9 % IV BOLUS
1000.0000 mL | Freq: Once | INTRAVENOUS | Status: AC
  Administered 2017-12-13: 1000 mL via INTRAVENOUS

## 2017-12-13 NOTE — ED Triage Notes (Signed)
Patient arrived by [facility] from home where she lives alone. Patient has mild dementia per granddaughter reports patient has had general weakness and dizziness x 24 hours. On arrival denies pain, NAD. CBG 145 and has colostomy. HR 40-50s during transport

## 2017-12-13 NOTE — Discharge Instructions (Addendum)
You were noted to have a slow heart rate today.  This may be what is causing your generalized weakness.  Please follow-up with cardiology on this matter as soon as possible.  Call the number provided to set up an appointment. Be sure to stay well-hydrated by drinking plenty of water. Use caution when you stand up from a sitting position. Return to the ED for worsening symptoms, passing out, chest pain, shortness of breath, vomiting, or any other major concerns.

## 2017-12-13 NOTE — ED Provider Notes (Signed)
[facility] EMERGENCY DEPARTMENT Provider Note   CSN: [id number] Arrival date & time: 12/13/17  1212     History   Chief Complaint No chief complaint on file.   HPI [name] is a 84 y.o. female.  HPI   [name] is a 84 y.o. female, with a history of asthma, HTN, and DM, presenting to the ED with generalized weakness for the past couple days.  Also notes some lightheadedness upon standing, but this resolves quickly. Patient apparently was evaluated for left hand weakness by her PCP at the beginning of September.  Subsequently had MRI of the brain with and without contrast.  This showed mucosal edema throughout the para nasal sinuses, for which she was prescribed amoxicillin. Denies chest pain, shortness of breath, cough, fever/chills, abdominal pain, N/V/C/D, syncope, falls/trauma, urinary symptoms, or any other complaints.     Past Medical History:  Diagnosis Date  . Asthma   . Colostomy present ([place])    hx/notes 07/17/2010  . Dementia   . Diabetic neuropathy ([place])   . Hypertension    hx/notes 07/17/2010  . Pneumonia    recent/notes 10/08/2016  . Type II diabetes mellitus ([place])    hx/notes 07/17/2010    Patient Active Problem List   Diagnosis Date Noted  . AKI (acute kidney injury) ([place]) 07/18/2017  . Colostomy present ([place]) 11/08/2016  . Syncope 10/08/2016  . Acute kidney injury ([place]) 10/08/2016  . Abnormal chest x-ray 10/08/2016  . Lethargy 10/08/2016  . Dementia   . Sepsis ([place]) 09/05/2016  . Community acquired pneumonia 09/05/2016  . Toxic metabolic encephalopathy 76/28/3151  . Diabetes mellitus with complication ([place]) 76/16/0737  . Nausea & vomiting 04/09/2012  . Gallstones 03/15/2011  . Partial SBO 03/15/2011  . Colostomy hernia ([place]) 03/15/2011  . PSEUDOGOUT 02/28/2007  . IRON DEFIC ANEMIA [place] DIET IRON INTAKE 02/28/2007  . DISCITIS 02/28/2007  . OSTEOMYELITIS 02/28/2007  . CHILLS WITHOUT FEVER 02/28/2007    Past Surgical  History:  Procedure Laterality Date  . ABDOMINAL HYSTERECTOMY    . COLOSTOMY     S/P SBO hx/notes 07/17/2010  . LUMBAR FUSION  11/2006   hx/notes 07/17/2010  . NASAL SINUS SURGERY  04/28/2002   Bilateral inferior turbinate reductions, bilateral maxillary antrotomies, bilateral total ethmoidectomies, bilateral frontal recess explorations, bilateral sphenoidotomies, Instatrac guidance./notes 07/28/2010     OB History   None      Home Medications    Prior to Admission medications   Medication Sig Start Date End Date Taking? Authorizing Provider  acetaminophen (TYLENOL) 325 MG tablet Take 650 mg by mouth every 6 (six) hours as needed for headache (pain).   Yes [provider]  albuterol (PROVENTIL HFA;VENTOLIN HFA) 108 (90 Base) MCG/ACT inhaler Inhale 1-2 puffs into the lungs every 4 (four) hours as needed for wheezing or shortness of breath.    Yes [provider]  albuterol (PROVENTIL) (2.5 MG/3ML) 0.083% nebulizer solution Inhale 2.5 mg into the lungs as needed. 05/26/17  Yes [provider]  alendronate (FOSAMAX) 70 MG tablet Take 70 mg by mouth every Monday.    Yes [provider]  amoxicillin (AMOXIL) 500 MG capsule Take 500 mg by mouth 2 (two) times daily. 12/02/17  Yes [provider]  busPIRone (BUSPAR) 5 MG tablet Take 5 mg by mouth 3 (three) times daily. 11/24/17  Yes [provider]  Calcium Citrate-Vitamin D (CALCIUM CITRATE + D) 315-250 MG-UNIT TABS Take 1 tablet by mouth daily.  Yes [provider]  donepezil (ARICEPT) 10 MG tablet Take 10 mg by mouth at bedtime.  02/24/15  Yes [provider]  escitalopram (LEXAPRO) 10 MG tablet Take 10 mg by mouth at bedtime. 06/16/17  Yes [provider]  FLOVENT HFA 220 MCG/ACT inhaler Inhale 1 puff into the lungs 2 (two) times daily. 05/28/17  Yes [provider]  formoterol (PERFOROMIST) 20 MCG/2ML nebulizer solution Take 2 mLs (20 mcg total) by  nebulization 2 (two) times daily. DX J45.44 Patient taking differently: Take 20 mcg by nebulization 4 (four) times daily as needed.  10/13/17  Yes Mannam, Praveen, MD  gabapentin (NEURONTIN) 300 MG capsule Take 300 mg by mouth 3 (three) times daily. 10/12/15  Yes [provider]  losartan (COZAAR) 100 MG tablet Take 100 mg by mouth daily.   Yes [provider]  memantine (NAMENDA XR) 28 MG CP24 24 hr capsule Take 28 mg by mouth daily.   Yes [provider]  montelukast (SINGULAIR) 10 MG tablet Take 10 mg by mouth at bedtime.  06/21/17  Yes [provider]  probenecid (BENEMID) 500 MG tablet Take 250 mg by mouth daily. 10/12/16  Yes [provider]  budesonide (PULMICORT) 0.5 MG/2ML nebulizer solution Take 2 mLs (0.5 mg total) by nebulization 2 (two) times daily. DX: J45.909 Patient not taking: Reported on 12/13/2017 08/30/17   Marshell Garfinkel, MD    Family History Family History  Problem Relation Age of Onset  . Kidney disease Mother   . Hypertension Mother   . Other Father        gangrene with amputation  . Diabetes Sister   . Cancer Brother   . Heart disease Brother   . Schizophrenia Daughter   . Diabetes Daughter   . Stroke Daughter   . Breast cancer Neg Hx     Social History Social History   Tobacco Use  . Smoking status: Never Smoker  . Smokeless tobacco: Former Systems developer    Types: Chew  Substance Use Topics  . Alcohol use: No  . Drug use: No     Allergies   Aspirin   Review of Systems Review of Systems  Constitutional: Negative for chills, diaphoresis and fever.  Respiratory: Negative for shortness of breath.   Cardiovascular: Negative for chest pain.  Gastrointestinal: Negative for abdominal pain, constipation, diarrhea, nausea and vomiting.  Genitourinary: Negative for dysuria, frequency and hematuria.  Neurological: Positive for weakness (generalized) and light-headedness. Negative for syncope, numbness and headaches.  All  other systems reviewed and are negative.    Physical Exam Updated Vital Signs BP (!) 152/67 (BP Location: Left Arm)   Pulse (!) 43   Temp 98.7 F (37.1 C) (Oral)   Resp 14   SpO2 97%   Physical Exam  Constitutional: She is oriented to person, place, and time. She appears well-developed and well-nourished. No distress.  HENT:  Head: Normocephalic and atraumatic.  Eyes: Pupils are equal, round, and reactive to light. Conjunctivae and EOM are normal.  Neck: Neck supple.  Cardiovascular: Regular rhythm, normal heart sounds and intact distal pulses. Bradycardia present.  Pulmonary/Chest: Effort normal and breath sounds normal. No respiratory distress.  Abdominal: Soft. There is no tenderness. There is no guarding.  Musculoskeletal: She exhibits no edema.  Lymphadenopathy:    She has no cervical adenopathy.  Neurological: She is alert and oriented to person, place, and time.  Sensation grossly intact to light touch in the extremities. Strength 5/5 in all extremities. No  gait disturbance. Coordination intact. Cranial nerves III-XII grossly intact. No facial droop.   Skin: Skin is warm and dry. She is not diaphoretic.  Psychiatric: She has a normal mood and affect. Her behavior is normal.  Nursing note and vitals reviewed.    ED Treatments / Results  Labs (all labs ordered are listed, but only abnormal results are displayed) Labs Reviewed  COMPREHENSIVE METABOLIC PANEL - Abnormal; Notable for the following components:      Result Value   Glucose, Bld 130 (*)    Creatinine, Ser 1.14 (*)    Albumin 3.4 (*)    GFR calc non Af Amer 44 (*)    GFR calc Af Amer 50 (*)    All other components within normal limits  CBC WITH DIFFERENTIAL/PLATELET - Abnormal; Notable for the following components:   MCHC 29.9 (*)    All other components within normal limits  URINALYSIS, ROUTINE W REFLEX MICROSCOPIC  I-STAT TROPONIN, ED    EKG EKG Interpretation  Date/Time:  Monday December 13 2017  12:34:18 EDT Ventricular Rate:  50 PR Interval:  214 QRS Duration: 100 QT Interval:  436 QTC Calculation: 397 R Axis:   -47 Text Interpretation:  Sinus bradycardia with 1st degree A-V block Left axis deviation No significant change since last tracing Confirmed by Lajean Saver [phone]) on 12/13/2017 3:54:46 PM   Radiology Dg Chest 2 View  Result Date: 12/13/2017 CLINICAL DATA:  Weakness and bradycardia. EXAM: CHEST - 2 VIEW COMPARISON:  07/16/2017 FINDINGS: Cardiac enlargement without heart failure. Lungs are clear without infiltrate effusion or mass. Mild ectasia thoracic aorta. Degenerative changes in both shoulder joints. No acute skeletal abnormality. IMPRESSION: No active cardiopulmonary disease. Electronically Signed   By: Franchot Gallo M.D.   On: 12/13/2017 14:33    Procedures Procedures (including critical care time)  Medications Ordered in ED Medications  sodium chloride 0.9 % bolus 1,000 mL (has no administration in time range)     Initial Impression / Assessment and Plan / ED Course  I have reviewed the triage vital signs and the nursing notes.  Pertinent labs & imaging results that were available during my care of the patient were reviewed by me and considered in my medical decision making (see chart for details).  Clinical Course as of Dec 13 1613  Mon Dec 13, 2017  1432 Improved from previous values.  Creatinine(!): 1.14 [SJ]    Clinical Course User Index [SJ] Kiani Wurtzel C, PA-C    Patient presents with complaint of generalized weakness.  No focal deficits on exam.  She is noted to be bradycardic without evidence of heart block.  She may be intermittently symptomatic to this bradycardia.  She is able to ambulate without assistance.  Findings and plan of care discussed with Lajean Saver, MD. Dr. Ashok Cordia personally evaluated and examined this patient.  End of shift patient care handoff exam given to Fabian November, EM resident. Plan: Patient receiving IV fluids.   Review UA for evidence of infection.  Patient can be discharged with cardiology follow-up.  Final Clinical Impressions(s) / ED Diagnoses   Final diagnoses:  Bradycardia    ED Discharge Orders    None       Layla Maw 12/13/17 1616    Lajean Saver, MD 12/14/17 [phone]

## 2017-12-13 NOTE — ED Provider Notes (Signed)
Transfer of Care Note I assumed care of patient from Arlean Hopping PA 12/13/17 at 1600.  Agree with history, physical exam and plan.  See their note for further details.    Briefly, the patient is a 84yo female with PMHx of asthma, HTN, and DM who presents with generalized weakness x 2-3 days that worsens upon standing.  Seen by PCP for the same and had negative MRI brain.  Noted to be bradycardic between 40-60bpm with 1st degree heart block on EKG.  Symptoms likely 2/2 symptomatic bradycardia.  Given IVF.  Work-up unremarkable.    Current plan is as follows: Awaiting UA and d/c home.  Reassessment: I personally reassessed the patient.  I reviewed her labs and imaging.  UA without infection.  On chart review patient with bradycardia to mid 50-60s for several months.  Able to ambulate.  Will d/c home and follow-up with cardiology.  Old records reviewed.  Imaging and labs reviewed and interpreted by myself and attending and used in the MDM.  Addressed patient question and concerns.  Reviewed discharged instructions with strict precautions given.  Advised patient to schedule follow-up with primary care provider.  Patient verbalized understanding and agrees with plan.  Patient stable at discharge.  The care of this patient was supervised by Dr. Maryan Rued, who agreed with the plan and management of the patient.   Clinical Impression: 1. Bradycardia     ED Dispo: Discharge    Fabian November, MD 12/14/17 0932    Blanchie Dessert, MD 12/14/17 [phone]

## 2017-12-15 ENCOUNTER — Ambulatory Visit (INDEPENDENT_AMBULATORY_CARE_PROVIDER_SITE_OTHER): Payer: Medicare Other | Admitting: Cardiovascular Disease

## 2017-12-15 ENCOUNTER — Encounter: Payer: Self-pay | Admitting: Cardiovascular Disease

## 2017-12-15 VITALS — BP 154/78 | HR 57 | Ht 62.0 in | Wt 151.0 lb

## 2017-12-15 DIAGNOSIS — R001 Bradycardia, unspecified: Secondary | ICD-10-CM

## 2017-12-15 DIAGNOSIS — R55 Syncope and collapse: Secondary | ICD-10-CM

## 2017-12-15 DIAGNOSIS — I10 Essential (primary) hypertension: Secondary | ICD-10-CM | POA: Diagnosis not present

## 2017-12-15 HISTORY — DX: Bradycardia, unspecified: R00.1

## 2017-12-15 NOTE — Progress Notes (Signed)
Cardiology Office Note   Date:  12/15/2017   ID:  [name], DOB Mar 30, 1933, MRN [id number]  PCP:  Leeroy Cha, MD  Cardiologist:   Skeet Latch, MD   No chief complaint on file.    History of Present Illness: [name] is a 84 y.o. female with asthma, hypertensio, mild dementia, and diabetes who is being seen today for the evaluation of symptomatic bradycardia at the request of Leeroy Cha,*.  [name] has been feeling dizzy off and on for several months.  Her grandmother,[name], lives with her and reports that it has been getting gradually worse.  She feels worse when she goes from sitting to standing.  She is also been increasingly tired and short of breath.  Her granddaughter notes that whenever she takes her out to walk by the time she gets home she has to sleep for the rest of the day.  Several months ago this was not an issue.  She has no chest pain or pressure.  Her breathing is okay.  She does have asthma and her inhalers help.  However she has not had to use them lately.  Her granddaughter notes that her appetite has been poor for the last 2 or 3 weeks.  She was seen in the emergency department 12/13/2017 with dizziness.  Labs were unremarkable and she had no UTI.  Her EKG showed sinus bradycardia with a first-degree heart block and a rate of 51 bpm.  While on telemetry she was noted to have heart rates from the 40 to 60.  Her granddaughter notes that her heart rate has been as low as 37 bpm. [name] had an unwitnessed fall last week.  She was in her bedroom and is unsure what happened.  The next thing she knew she was lying on the floor.  She denies any preceding chest pain, shortness of breath, lightheadedness, or dizziness.  She has not expands any palpitations.  She has not experienced any lower extremity edema, orthopnea or PND.   Past Medical History:  Diagnosis Date  . Asthma   . Colostomy present ([place])    hx/notes 07/17/2010  . Dementia  ([place])   . Diabetic neuropathy ([place])   . Hypertension    hx/notes 07/17/2010  . Pneumonia    recent/notes 10/08/2016  . Symptomatic bradycardia 12/15/2017  . Type II diabetes mellitus ([place])    hx/notes 07/17/2010    Past Surgical History:  Procedure Laterality Date  . ABDOMINAL HYSTERECTOMY    . COLOSTOMY     S/P SBO hx/notes 07/17/2010  . LUMBAR FUSION  11/2006   hx/notes 07/17/2010  . NASAL SINUS SURGERY  04/28/2002   Bilateral inferior turbinate reductions, bilateral maxillary antrotomies, bilateral total ethmoidectomies, bilateral frontal recess explorations, bilateral sphenoidotomies, Instatrac guidance./notes 07/28/2010     Current Outpatient Medications  Medication Sig Dispense Refill  . acetaminophen (TYLENOL) 325 MG tablet Take 650 mg by mouth every 6 (six) hours as needed for headache (pain).    Marland Kitchen albuterol (PROVENTIL HFA;VENTOLIN HFA) 108 (90 Base) MCG/ACT inhaler Inhale 1-2 puffs into the lungs every 4 (four) hours as needed for wheezing or shortness of breath.     Marland Kitchen albuterol (PROVENTIL) (2.5 MG/3ML) 0.083% nebulizer solution Inhale 2.5 mg into the lungs as needed.  1  . alendronate (FOSAMAX) 70 MG tablet Take 70 mg by mouth every Monday.     . budesonide (PULMICORT) 0.5 MG/2ML nebulizer solution Take 2 mLs (0.5 mg total) by nebulization 2 (two)  times daily. DX: J45.909 120 mL 5  . busPIRone (BUSPAR) 5 MG tablet Take 5 mg by mouth 3 (three) times daily.  0  . Calcium Citrate-Vitamin D (CALCIUM CITRATE + D) 315-250 MG-UNIT TABS Take 1 tablet by mouth daily.    Marland Kitchen donepezil (ARICEPT) 10 MG tablet Take 10 mg by mouth at bedtime.   1  . escitalopram (LEXAPRO) 10 MG tablet Take 10 mg by mouth at bedtime.  5  . FLOVENT HFA 220 MCG/ACT inhaler Inhale 1 puff into the lungs 2 (two) times daily.  2  . formoterol (PERFOROMIST) 20 MCG/2ML nebulizer solution Take 2 mLs (20 mcg total) by nebulization 2 (two) times daily. DX J45.44 (Patient taking differently: Take 20 mcg by nebulization 4 (four)  times daily as needed. ) 120 mL 6  . gabapentin (NEURONTIN) 300 MG capsule Take 300 mg by mouth 3 (three) times daily.  1  . Lactobacillus (PROBIOTIC ACIDOPHILUS PO) Take by mouth.    . losartan (COZAAR) 100 MG tablet Take 100 mg by mouth daily.    . memantine (NAMENDA XR) 28 MG CP24 24 hr capsule Take 28 mg by mouth daily.    . montelukast (SINGULAIR) 10 MG tablet Take 10 mg by mouth at bedtime.   5  . probenecid (BENEMID) 500 MG tablet Take 250 mg by mouth daily.  0   No current facility-administered medications for this visit.     Allergies:   Aspirin    Social History:  The patient  reports that she has never smoked. She quit smokeless tobacco use about 9 months ago.  Her smokeless tobacco use included chew. She reports that she does not drink alcohol or use drugs.   Family History:  The patient's family history includes Cancer in her brother; Diabetes in her daughter, sister, and sister; Heart disease in her brother; Hypertension in her mother; Kidney disease in her mother; Other in her father; Schizophrenia in her daughter; Stroke in her daughter.    ROS:  Please see the history of present illness.   Otherwise, review of systems are positive for none.   All other systems are reviewed and negative.    PHYSICAL EXAM: VS:  BP (!) 154/78   Pulse (!) 57   Ht 5\' 2"  (1.575 m)   Wt 151 lb (68.5 kg)   SpO2 95%   BMI 27.62 kg/m  , BMI Body mass index is 27.62 kg/m. GENERAL:  Well appearing HEENT:  Pupils equal round and reactive, fundi not visualized, oral mucosa unremarkable NECK:  No jugular venous distention, waveform within normal limits, carotid upstroke brisk and symmetric, no bruits LUNGS:  Clear to auscultation bilaterally HEART:  Bradycardic.  Regular rhythm.  PMI not displaced or sustained,S1 and S2 within normal limits, no S3, no S4, no clicks, no rubs, no murmurs ABD:  Flat, positive bowel sounds normal in frequency in pitch, no bruits, no rebound, no guarding, no midline  pulsatile mass, no hepatomegaly, no splenomegaly EXT:  2 plus pulses throughout, no edema, no cyanosis no clubbing SKIN:  No rashes no nodules NEURO:  Cranial nerves II through XII grossly intact, motor grossly intact throughout PSYCH:  Cognitively intact, oriented to person place and time   EKG:  EKG is ordered today. The ekg ordered today demonstrates   Echo 10/01/16: Study Conclusions  - Left ventricle: The cavity size was normal. Wall thickness was   increased in a pattern of moderate LVH. Systolic function was   vigorous. The estimated ejection  fraction was in the range of 65%   to 70%. Wall motion was normal; there were no regional wall   motion abnormalities. Doppler parameters are consistent with   abnormal left ventricular relaxation (grade 1 diastolic   dysfunction).   Recent Labs: 07/18/2017: TSH 3.236 12/13/2017: ALT 9; BUN 23; Creatinine, Ser 1.14; Hemoglobin 12.6; Platelets 212; Potassium 4.2; Sodium 139    Lipid Panel No results found for: CHOL, TRIG, HDL, CHOLHDL, VLDL, LDLCALC, LDLDIRECT    Wt Readings from Last 3 Encounters:  12/15/17 151 lb (68.5 kg)  10/13/17 154 lb (69.9 kg)  08/17/17 134 lb (60.8 kg)      ASSESSMENT AND PLAN:  # Symptomatic bradycardia:  [name] has symptomatic bradycardia with HR as low as 40 by medical professionals and 37 according to her granddaughter. She had a syncopal episode that was likely attributable to bradcyardia.  She has no symptoms concerning for ischemia.  Thyroid function was normal 07/2017 and CBC/CMP were without significant abnormality 11/2017.  She has not been on any nodal agents.  We will refer her to EP for PPM.  Repeat echo.  She was not orthostatic.  # Hypertension: BP above goal.  However we will allow permissive hypertension given that she has bradycardia and dizziness.  Continue losartan.    Current medicines are reviewed at length with the patient today.  The patient does not have concerns regarding  medicines.  The following changes have been made:  no change  Labs/ tests ordered today include:   Orders Placed This Encounter  Procedures  . Ambulatory referral to Cardiac Electrophysiology  . ECHOCARDIOGRAM COMPLETE     Disposition:   FU with Tylar Merendino C. Oval Linsey, MD, [facility] in 2 months.      Signed, France Lusty C. Oval Linsey, MD, [facility]  12/15/2017 4:39 PM    [place]

## 2017-12-15 NOTE — Patient Instructions (Signed)
Medication Instructions:  Your physician recommends that you continue on your current medications as directed. Please refer to the Current Medication list given to you today.  Labwork: NONE  Testing/Procedures: Your physician has requested that you have an echocardiogram. Echocardiography is a painless test that uses sound waves to create images of your heart. It provides your doctor with information about the size and shape of your heart and how well your heart's chambers and valves are working. This procedure takes approximately one hour. There are no restrictions for this procedure. [place] [address]  Follow-Up: Your physician recommends that you schedule a follow-up appointment in: 2 MONTHS WITH DR [facility]   If you need a refill on your cardiac medications before your next appointment, please call your pharmacy.

## 2017-12-17 ENCOUNTER — Ambulatory Visit (HOSPITAL_COMMUNITY): Payer: Medicare Other | Attending: Cardiology

## 2017-12-17 ENCOUNTER — Other Ambulatory Visit: Payer: Self-pay

## 2017-12-17 DIAGNOSIS — J45909 Unspecified asthma, uncomplicated: Secondary | ICD-10-CM | POA: Diagnosis not present

## 2017-12-17 DIAGNOSIS — I10 Essential (primary) hypertension: Secondary | ICD-10-CM | POA: Insufficient documentation

## 2017-12-17 DIAGNOSIS — I272 Pulmonary hypertension, unspecified: Secondary | ICD-10-CM | POA: Diagnosis not present

## 2017-12-17 DIAGNOSIS — R55 Syncope and collapse: Secondary | ICD-10-CM

## 2017-12-17 DIAGNOSIS — R001 Bradycardia, unspecified: Secondary | ICD-10-CM | POA: Diagnosis present

## 2017-12-17 DIAGNOSIS — E119 Type 2 diabetes mellitus without complications: Secondary | ICD-10-CM | POA: Diagnosis not present

## 2017-12-24 ENCOUNTER — Encounter: Payer: Self-pay | Admitting: Internal Medicine

## 2017-12-24 ENCOUNTER — Ambulatory Visit (INDEPENDENT_AMBULATORY_CARE_PROVIDER_SITE_OTHER): Payer: Medicare Other | Admitting: Internal Medicine

## 2017-12-24 VITALS — BP 136/70 | HR 56 | Ht 62.0 in | Wt 151.0 lb

## 2017-12-24 DIAGNOSIS — R001 Bradycardia, unspecified: Secondary | ICD-10-CM

## 2017-12-24 DIAGNOSIS — R55 Syncope and collapse: Secondary | ICD-10-CM

## 2017-12-24 NOTE — Patient Instructions (Signed)
Medication Instructions:  Your physician recommends that you continue on your current medications as directed. Please refer to the Current Medication list given to you today.  Labwork: None ordered.  Testing/Procedures: None ordered.  Follow-Up: Your physician recommends that you schedule a follow-up appointment in:   6 weeks with Dr Caryl Comes  Any Other Special Instructions Will Be Listed Below (If Applicable).     If you need a refill on your cardiac medications before your next appointment, please call your pharmacy.

## 2017-12-24 NOTE — Progress Notes (Signed)
ELECTROPHYSIOLOGY CONSULT NOTE  Patient ID: [name], MRN: [id number], DOB/AGE: [birth year] 84 y.o. Admit date: (Not on file) Date of Consult: 12/24/2017  Primary Physician: Leeroy Cha, MD Primary Cardiologist: TR     [name] is a 84 y.o. female who is being seen today for the evaluation of bradycardia at the request of Dr TR.    HPI [name] is a 84 y.o. female is a significantly demented woman who has complaints of weakness and lightheadedness that is intermittent.  She was seen by home health and noted to have sinus bradycardia with 37-60. \  Has recent fall with unawareness.    DATE TEST EF   7/18 Echo   65-70 % LVH mod  10/19 Echo   65-70 %         Seh was noted to be not orthostatic in the office   Some sob; denies chest pain   Past Medical History:  Diagnosis Date  . Asthma   . Colostomy present ([place])    hx/notes 07/17/2010  . Dementia ([place])   . Diabetic neuropathy ([place])   . Hypertension    hx/notes 07/17/2010  . Pneumonia    recent/notes 10/08/2016  . Symptomatic bradycardia 12/15/2017  . Type II diabetes mellitus ([place])    hx/notes 07/17/2010      Surgical History:  Past Surgical History:  Procedure Laterality Date  . ABDOMINAL HYSTERECTOMY    . COLOSTOMY     S/P SBO hx/notes 07/17/2010  . LUMBAR FUSION  11/2006   hx/notes 07/17/2010  . NASAL SINUS SURGERY  04/28/2002   Bilateral inferior turbinate reductions, bilateral maxillary antrotomies, bilateral total ethmoidectomies, bilateral frontal recess explorations, bilateral sphenoidotomies, Instatrac guidance./notes 07/28/2010     Home Meds: Prior to Admission medications   Medication Sig Start Date End Date Taking? Authorizing Provider  acetaminophen (TYLENOL) 325 MG tablet Take 650 mg by mouth every 6 (six) hours as needed for headache (pain).    [provider]  albuterol (PROVENTIL HFA;VENTOLIN HFA) 108 (90 Base) MCG/ACT inhaler Inhale 1-2 puffs into the lungs  every 4 (four) hours as needed for wheezing or shortness of breath.     [provider]  albuterol (PROVENTIL) (2.5 MG/3ML) 0.083% nebulizer solution Inhale 2.5 mg into the lungs as needed. 05/26/17   [provider]  alendronate (FOSAMAX) 70 MG tablet Take 70 mg by mouth every Monday.     [provider]  budesonide (PULMICORT) 0.5 MG/2ML nebulizer solution Take 2 mLs (0.5 mg total) by nebulization 2 (two) times daily. DX: J45.909 08/30/17   Mannam, Hart Robinsons, MD  busPIRone (BUSPAR) 5 MG tablet Take 5 mg by mouth 3 (three) times daily. 11/24/17   [provider]  Calcium Citrate-Vitamin D (CALCIUM CITRATE + D) 315-250 MG-UNIT TABS Take 1 tablet by mouth daily.    [provider]  donepezil (ARICEPT) 10 MG tablet Take 10 mg by mouth at bedtime.  02/24/15   [provider]  escitalopram (LEXAPRO) 10 MG tablet Take 10 mg by mouth at bedtime. 06/16/17   [provider]  FLOVENT HFA 220 MCG/ACT inhaler Inhale 1 puff into the lungs 2 (two) times daily. 05/28/17   [provider]  formoterol (PERFOROMIST) 20 MCG/2ML nebulizer solution Take 2 mLs (20 mcg total) by nebulization 2 (two) times daily. DX J45.44 Patient taking differently: Take 20 mcg by nebulization 4 (four) times daily as needed.  10/13/17   Marshell Garfinkel, MD  gabapentin (NEURONTIN)  300 MG capsule Take 300 mg by mouth 3 (three) times daily. 10/12/15   [provider]  Lactobacillus (PROBIOTIC ACIDOPHILUS PO) Take by mouth.    [provider]  losartan (COZAAR) 100 MG tablet Take 100 mg by mouth daily.    [provider]  memantine (NAMENDA XR) 28 MG CP24 24 hr capsule Take 28 mg by mouth daily.    [provider]  montelukast (SINGULAIR) 10 MG tablet Take 10 mg by mouth at bedtime.  06/21/17   [provider]  probenecid (BENEMID) 500 MG tablet Take 250 mg by mouth daily. 10/12/16   [provider]    Allergies:  Allergies    Allergen Reactions  . Aspirin Other (See Comments)    Triggers asthma    Social History   Socioeconomic History  . Marital status: Single    Spouse name: Not on file  . Number of children: Not on file  . Years of education: Not on file  . Highest education level: Not on file  Occupational History  . Not on file  Social Needs  . Financial resource strain: Not on file  . Food insecurity:    Worry: Not on file    Inability: Not on file  . Transportation needs:    Medical: Not on file    Non-medical: Not on file  Tobacco Use  . Smoking status: Never Smoker  . Smokeless tobacco: Former Systems developer    Types: Chew  Substance and Sexual Activity  . Alcohol use: No  . Drug use: No  . Sexual activity: Never  Lifestyle  . Physical activity:    Days per week: Not on file    Minutes per session: Not on file  . Stress: Not on file  Relationships  . Social connections:    Talks on phone: Not on file    Gets together: Not on file    Attends religious service: Not on file    Active member of club or organization: Not on file    Attends meetings of clubs or organizations: Not on file    Relationship status: Not on file  . Intimate partner violence:    Fear of current or ex partner: Not on file    Emotionally abused: Not on file    Physically abused: Not on file    Forced sexual activity: Not on file  Other Topics Concern  . Not on file  Social History Narrative  . Not on file     Family History  Problem Relation Age of Onset  . Kidney disease Mother   . Hypertension Mother   . Other Father        gangrene with amputation  . Diabetes Sister   . Cancer Brother   . Heart disease Brother   . Schizophrenia Daughter   . Diabetes Daughter   . Stroke Daughter   . Diabetes Sister   . Breast cancer Neg Hx      ROS:  Please see the history of present illness.     All other systems reviewed and negative.    Physical Exam:  Blood pressure 136/70, pulse (!) 56, height 5\' 2"   (1.575 m), weight 151 lb (68.5 kg), SpO2 95 %. General: Well developed, well nourished female in no acute distress. Head: Normocephalic, atraumatic, sclera non-icteric, no xanthomas, nares are without discharge. EENT: normal  Lymph Nodes:  none Neck: Negative for carotid bruits. JVD not elevated. Back:without scoliosis kyphosis Lungs: Clear bilaterally to auscultation  without wheezes, rales, or rhonchi. Breathing is unlabored. Heart: RRR with S1 S2.  murmur . No rubs, or gallops appreciated. Abdomen: Soft, non-tender, non-distended with normoactive bowel sounds. No hepatomegaly. No rebound/guarding. No obvious abdominal masses. Msk:  Strength and tone appear normal for age. Extremities: No clubbing or cyanosis. No edema.  Distal pedal pulses are 2+ and equal bilaterally. Skin: Warm and Dry Neuro: Alert and oriented X 1. CN III-XII intact Grossly normal sensory and motor function . Psych:  Responds to questions appropriately with a normal affect.      Labs: Cardiac Enzymes No results for input(s): CKTOTAL, CKMB, TROPONINI in the last 72 hours. CBC Lab Results  Component Value Date   WBC 5.6 12/13/2017   HGB 12.6 12/13/2017   HCT 42.2 12/13/2017   MCV 99.3 12/13/2017   PLT 212 12/13/2017   PROTIME: No results for input(s): LABPROT, INR in the last 72 hours. Chemistry No results for input(s): NA, K, CL, CO2, BUN, CREATININE, CALCIUM, PROT, BILITOT, ALKPHOS, ALT, AST, GLUCOSE in the last 168 hours.  Invalid input(s): LABALBU Lipids No results found for: CHOL, HDL, LDLCALC, TRIG BNP Pro B Natriuretic peptide (BNP)  Date/Time Value Ref Range Status  01/21/2007 06:45 AM 66.0  Final   Thyroid Function Tests: No results for input(s): TSH, T4TOTAL, T3FREE, THYROIDAB in the last 72 hours.  Invalid input(s): FREET3 Miscellaneous No results found for: DDIMER  Radiology/Studies:  Dg Chest 2 View  Result Date: 12/13/2017 CLINICAL DATA:  Weakness and bradycardia. EXAM: CHEST - 2  VIEW COMPARISON:  07/16/2017 FINDINGS: Cardiac enlargement without heart failure. Lungs are clear without infiltrate effusion or mass. Mild ectasia thoracic aorta. Degenerative changes in both shoulder joints. No acute skeletal abnormality. IMPRESSION: No active cardiopulmonary disease. Electronically Signed   By: Franchot Gallo M.D.   On: 12/13/2017 14:33    EKG: sinus   Assessment and Plan:  Sinus bradycardia   Dementia  Hypertension  Orthostatic weakness   It is unclear as to the contribution of her bradycardia to her symptoms.  She is limited in her activity and significantly demented so her ability to give a history is negligible.  Her granddaughter however is quite observant and it has been said that her heart rates to get slow and that there has been progressive weakness.  Looking at heart rate data over the last 6 months there has been a modest but not significant decline.    It is not clear that the bradycardia is significantly worse than it has been although she has had some episodes of more significant heart rate slowing.  It is also not clear as to how it is associated with her symptoms.  Her dementia changes the risk profile of the procedure as well and so after long discussions with her daughter and granddaughter, we have elected a an approach of intense observation.  They will use their pulse oximeter to track heart rates a couple of times a day and trying to correlate symptoms of weakness or fatigue with her bradycardia.  If there are interval falls they are reminded to come to the hospital and at that point we just undertake pacing because of the potential contribution of her bradycardia  Reviewed with TR and she was in agreement with plan    Virl Axe

## 2018-01-31 ENCOUNTER — Ambulatory Visit: Payer: Self-pay | Admitting: Internal Medicine

## 2018-02-05 IMAGING — CT CT HEAD W/O CM
3 of 7 series · 14 of 47 positions shown, 17 images · non-contrast
Comparison: CTA 10/08/2016, brain MRI 09/05/2016

CLINICAL DATA: Head trauma, minor, GCS>=13, high clinical risk,
initial exam; C-spine trauma, high clinical risk (NEXUS/CCR). Found
in the floor in the bathroom. Altered mental status.

EXAM:
CT HEAD WITHOUT CONTRAST
CT CERVICAL SPINE WITHOUT CONTRAST
TECHNIQUE: Multidetector CT imaging of the head and cervical spine was
performed following the standard protocol without intravenous
contrast. Multiplanar CT image reconstructions of the cervical spine
were also generated.

[Series 9: head 3.0 mpr sag · sagittal · 0.37mm/px · 2 of 67 slices shown]
[im 23/67  brain]
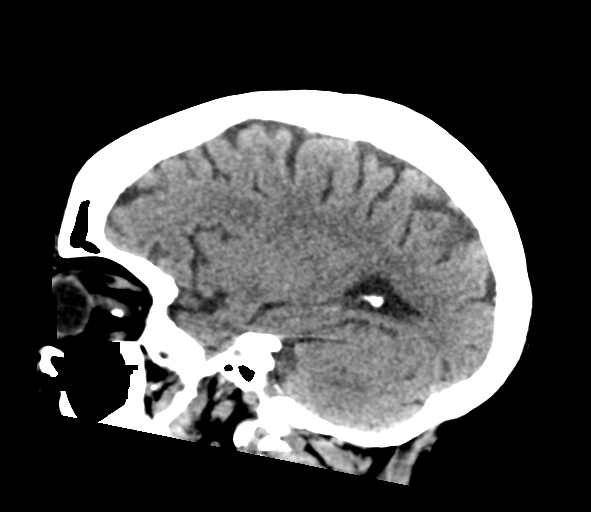
[im 45/67  brain]
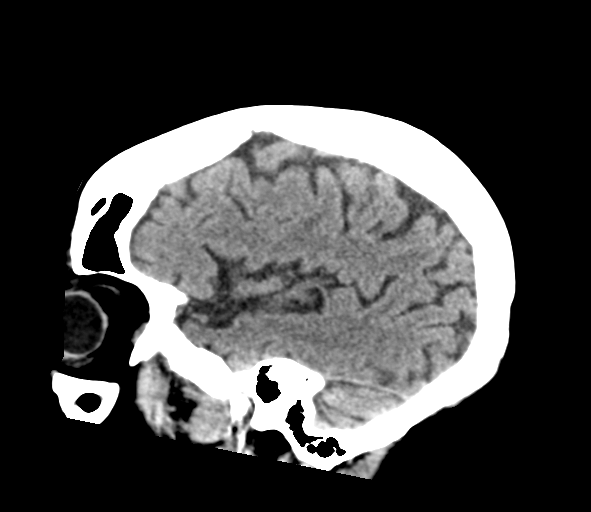

[Series 12: coronal bone · coronal · 0.26mm/px · 3 of 61 slices shown]
[im 18/61  brain]
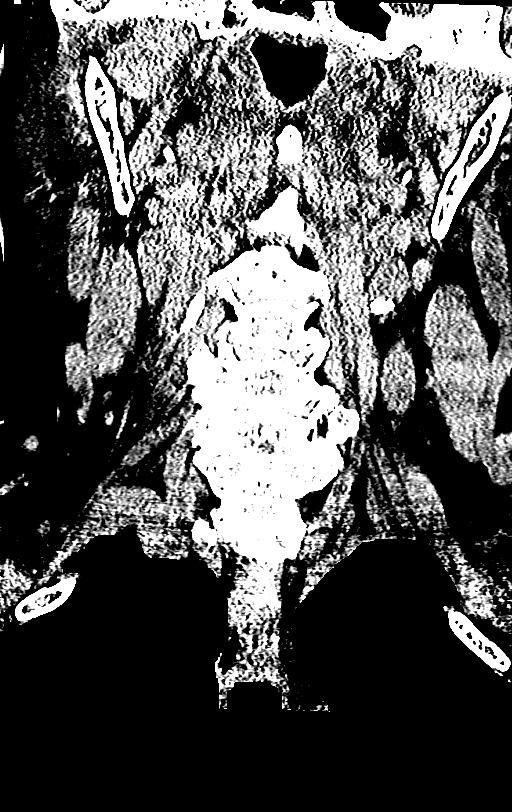
[im 26/61  brain]
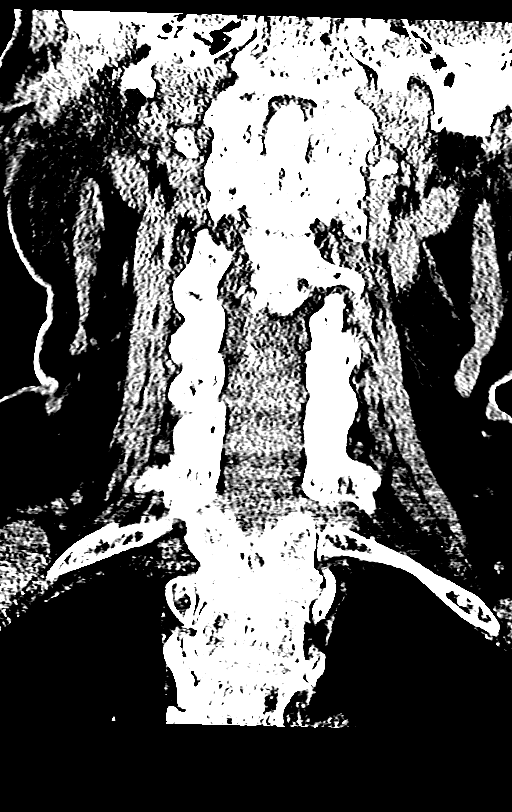
[im 35/61  brain]
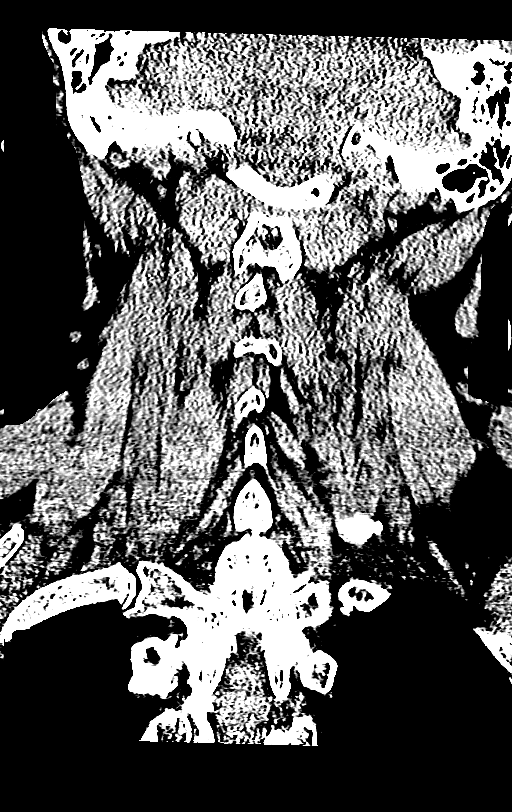

[Series 14: orthogonal axial st · axial · 0.21mm/px · z∈[-254,-101]mm · 9 of 94 slices shown, 12 images]
[im 8/94  brain]
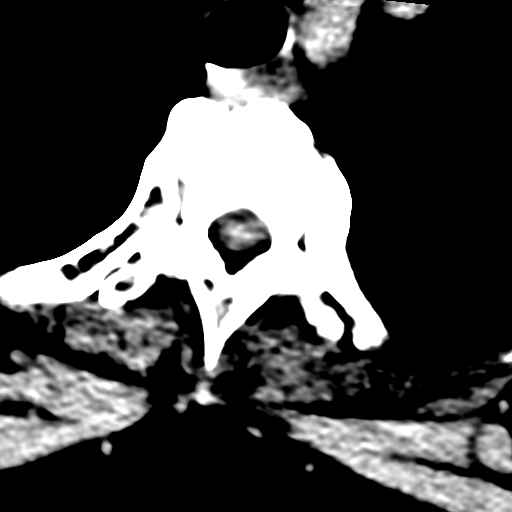
[im 8/94  bone]
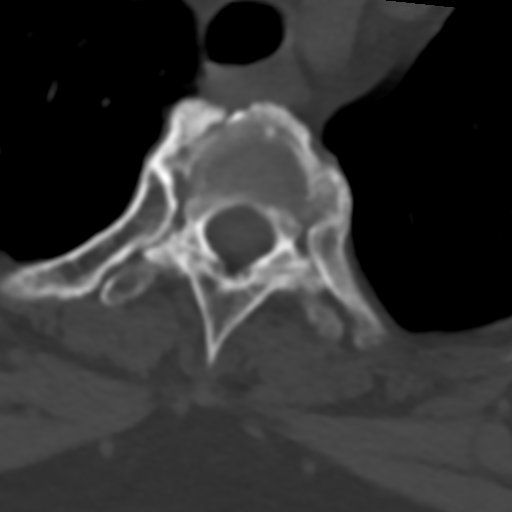
[im 16/94  brain]
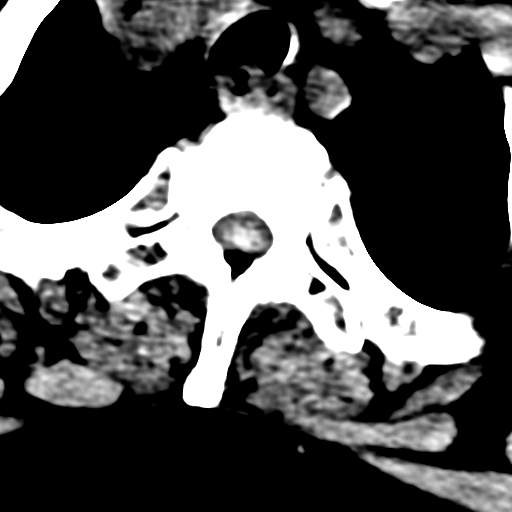
[im 32/94  brain]
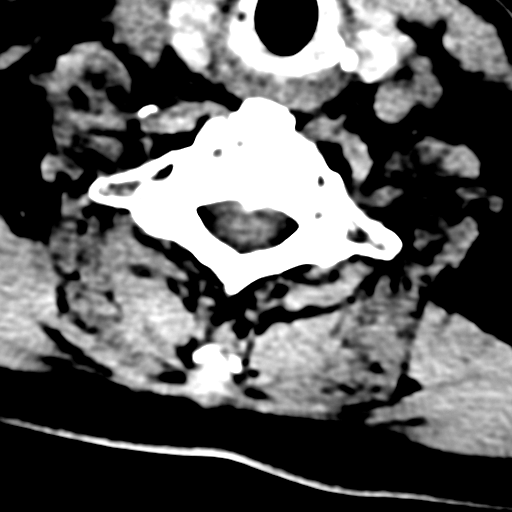
[im 39/94  brain]
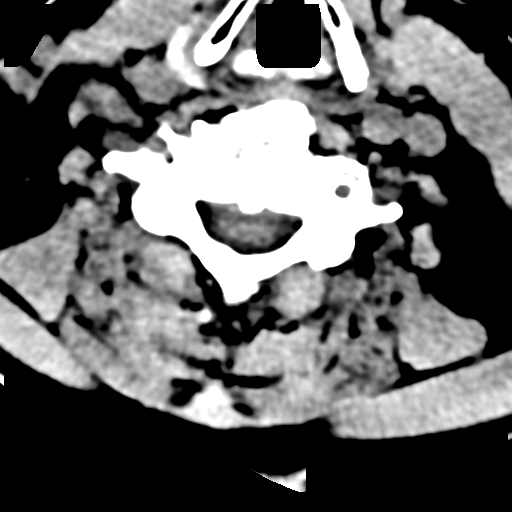
[im 47/94  brain]
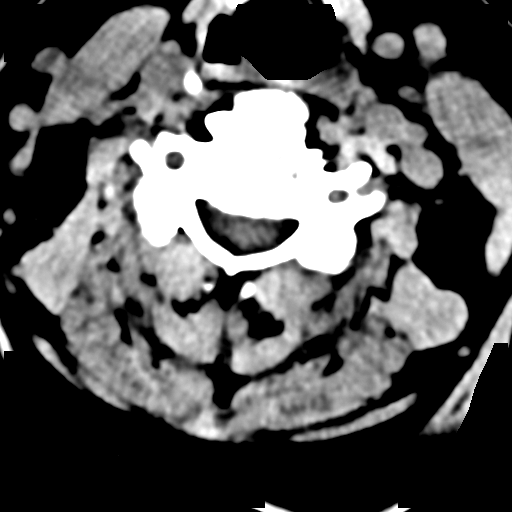
[im 47/94  bone]
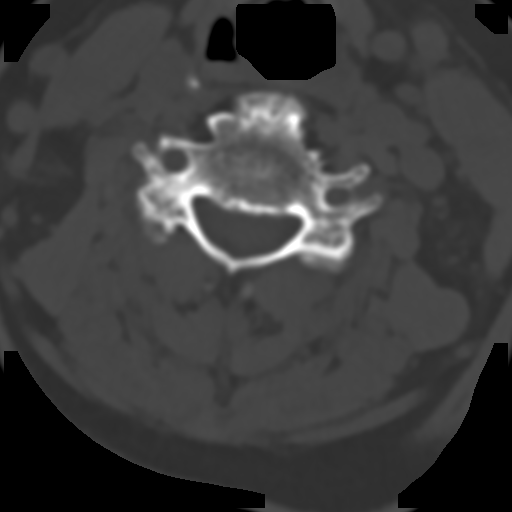
[im 55/94  brain]
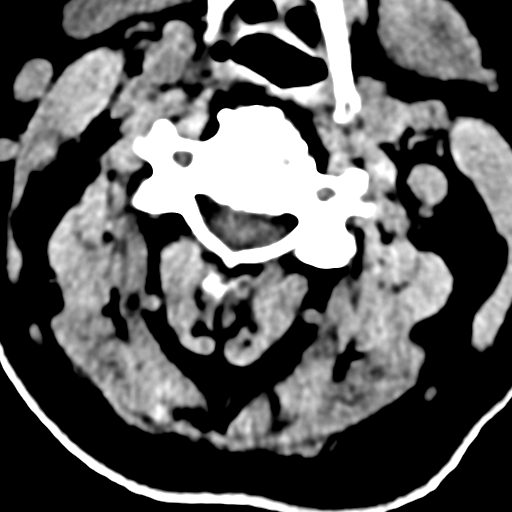
[im 63/94  brain]
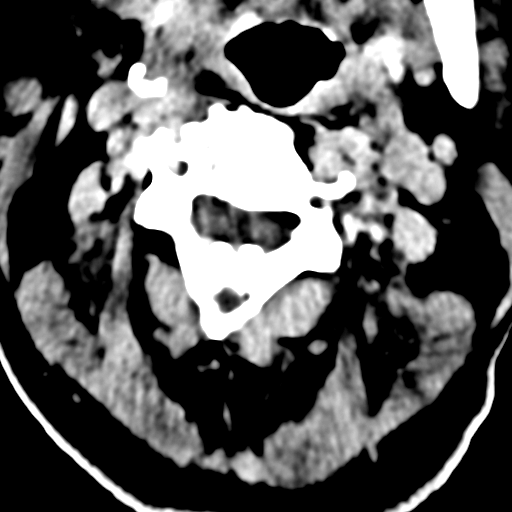
[im 78/94  brain]
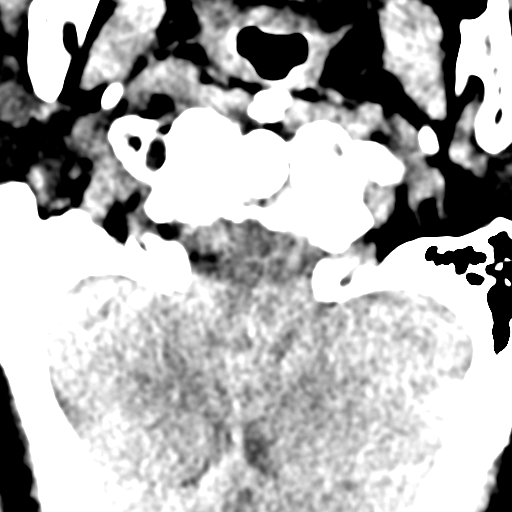
[im 86/94  brain]
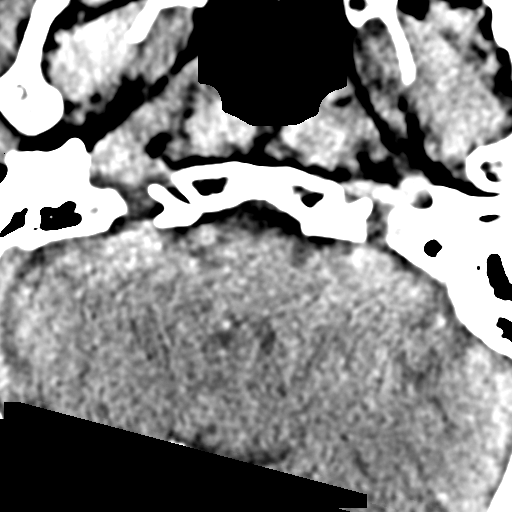
[im 86/94  bone]
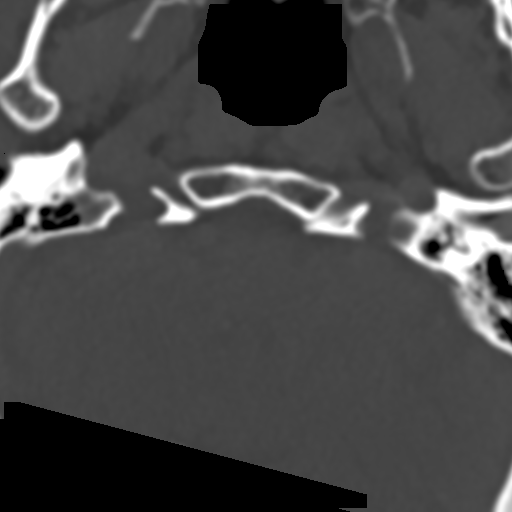

[14 of 47 positions shown; findings below may reference images not displayed]

FINDINGS: CT HEAD FINDINGS

Brain: Stable degree of atrophy and chronic small vessel ischemia.
No intracranial hemorrhage, mass effect, or midline shift. No
hydrocephalus. The basilar cisterns are patent. Song Maki and
cerebellar tonsillar ectopia, stable from prior exams. No evidence
of territorial infarct or acute ischemia. No extra-axial or
intracranial fluid collection.

Vascular: No hyperdense vessel or unexpected calcification.

Skull: No skull fracture.  No focal lesion.

Sinuses/Orbits: Chronic paranasal sinus inflammation, similar to
prior exam. Prior sinus surgery. No new sinus fluid levels.
Persistent non opacification of lower right mastoid air cells.
Chronic calcifications in the region of both optic nerves, stable.

Other: None.

CT CERVICAL SPINE FINDINGS

Alignment: Normal alignment.

Skull base and vertebrae: No acute fracture. Vertebral body heights
are maintained. The dens and skull base are intact. Pannus about the
odontoid) is unchanged.

Soft tissues and spinal canal: No prevertebral fluid or swelling. No
visible canal hematoma.

Disc levels: Diffuse disc space narrowing and endplate spurring.
Multilevel facet arthropathy, some which is ossified.

Upper chest: No acute abnormality.

Other: Carotid calcifications.
IMPRESSION: 1. No acute intracranial abnormality.  No skull fracture.
2. No acute fracture or subluxation of the cervical spine.
3. Chronic changes in the head and cervical spine are stable from
prior exam.

## 2018-02-16 ENCOUNTER — Ambulatory Visit: Payer: Self-pay | Admitting: Pulmonary Disease

## 2018-02-17 ENCOUNTER — Ambulatory Visit: Payer: Self-pay | Admitting: Cardiovascular Disease

## 2018-02-23 ENCOUNTER — Telehealth: Payer: Self-pay | Admitting: Cardiovascular Disease

## 2018-02-23 ENCOUNTER — Ambulatory Visit: Payer: Self-pay | Admitting: Cardiology

## 2018-02-23 NOTE — Telephone Encounter (Signed)
Pts granddaughter on [place], [name], called to report that she called EMS this morning because the pt was so tired and had no energy this morning.Marland Kitchen Her HR on the monitor was 50 and she noticed 1st degree av block on the monitor.. I explained to her that she saw Dr. Caryl Comes for a possible pacemaker and her HR is low and she may not be compensating..  The pt declined EMS taking her to the ER and does not want a pacemaker.  She reports that she is not eating, she sleeps at 6pm then does not get up until very late the next day. She has no energy at all, even to take care of herself or even eat. She is very concerned and very stressed that it is becoming very hard on her to take care of her.  I advised her to try to get her to agree to see her PMD or better option to get her to go to the ER. I advised her that she can get the help she needs there and it will help to relieve the pressure on her.  She agrees and says that she is not well at all and will try to get her to go if not will talk with her daughter and get the rest of the family to help her.

## 2018-02-23 NOTE — Telephone Encounter (Signed)
Advised [name], verbalized understanding

## 2018-02-23 NOTE — Telephone Encounter (Signed)
Agree.  She should be seen by urgent care, PCP or ED.

## 2018-02-23 NOTE — Telephone Encounter (Signed)
New Message:     [name] said she had to call EMS today for pt. She was extremely weak and tired,she also does not have much appetite. She said they said pt had a First Degree AV Blockage. [name] wants to know if pt had this blockage, her last office visit?

## 2018-03-01 ENCOUNTER — Encounter (INDEPENDENT_AMBULATORY_CARE_PROVIDER_SITE_OTHER): Payer: Self-pay

## 2018-03-01 ENCOUNTER — Ambulatory Visit (INDEPENDENT_AMBULATORY_CARE_PROVIDER_SITE_OTHER): Payer: Medicare Other | Admitting: Cardiology

## 2018-03-01 ENCOUNTER — Encounter: Payer: Self-pay | Admitting: Cardiology

## 2018-03-01 VITALS — BP 133/72 | HR 56 | Wt 151.6 lb

## 2018-03-01 DIAGNOSIS — J438 Other emphysema: Secondary | ICD-10-CM | POA: Diagnosis not present

## 2018-03-01 DIAGNOSIS — R001 Bradycardia, unspecified: Secondary | ICD-10-CM | POA: Diagnosis not present

## 2018-03-01 DIAGNOSIS — I10 Essential (primary) hypertension: Secondary | ICD-10-CM | POA: Diagnosis not present

## 2018-03-01 DIAGNOSIS — J449 Chronic obstructive pulmonary disease, unspecified: Secondary | ICD-10-CM | POA: Insufficient documentation

## 2018-03-01 DIAGNOSIS — F0150 Vascular dementia without behavioral disturbance: Secondary | ICD-10-CM

## 2018-03-01 NOTE — Patient Instructions (Signed)
Medication Instructions:  Your physician recommends that you continue on your current medications as directed. Please refer to the Current Medication list given to you today. If you need a refill on your cardiac medications before your next appointment, please call your pharmacy.   Lab work: None  If you have labs (blood work) drawn today and your tests are completely normal, you will receive your results only by: Marland Kitchen MyChart Message (if you have MyChart) OR . A paper copy in the mail If you have any lab test that is abnormal or we need to change your treatment, we will call you to review the results.  Testing/Procedures: None   Follow-Up: At [facility], you and your health needs are our priority.  As part of our continuing mission to provide you with exceptional heart care, we have created designated Provider Care Teams.  These Care Teams include your primary Cardiologist (physician) and Advanced Practice Providers (APPs -  Physician Assistants and Nurse Practitioners) who all work together to provide you with the care you need, when you need it. You will need a follow up appointment in 6 months.  Please call our office 2 months in advance to schedule this appointment.  You may see DR [facility] or one of the following Advanced Practice Providers on your designated Care Team:   Kerin Ransom, PA-C Roby Lofts, [place] . Sande Rives, PA-C  Any Other Special Instructions Will Be Listed Below (If Applicable). KEEP YOUR APPT WITH DR Caryl Comes AS SCHEDULED

## 2018-03-01 NOTE — Progress Notes (Signed)
03/01/2018 [name]   June 26, 1933  [id number]  Primary Physician Leeroy Cha, MD Primary Cardiologist: Dr Greenevers/ Dr Caryl Comes  HPI: The patient is an 84 year old female with a history of asthmatic COPD, hypertension, and dementia.  She was referred to Dr. Oval Linsey for evaluation for possible symptomatic bradycardia.  The patient had been seen in the emergency room with weakness and her EKG showed heart rates in the 40s and 50s with first-degree AV block.  She apparently was on no chronotropic agents.  An echocardiogram done in October 2019 showed normal LV function with moderate pulmonary hypertension with a PA pressure in the 40s.  A monitor was not done because of the patient's dementia, it was not felt the patient could follow the instructions.  She was also evaluated by Dr. Caryl Comes on December 24, 2017.  He was not convinced her bradycardia was causing her any symptoms.  He suggested to the family that they monitor her heart rate at home by checking her pulse from time to time.  Patient is seen today in the office as a follow-up from Dr. Blenda Mounts visit.  Since we saw her last EMS was called to come to the patient's house for weakness.  An EKG was done there that showed first-degree AV block.  The patient did not go to the hospital.  She is here with her sister today, usually at her granddaughter who accompanies her and knows her history.  Neither the patient nor the patient's sister was sure about her medications.  The patient sister did say that her mother has not had any syncopal spells.   Current Outpatient Medications  Medication Sig Dispense Refill  . acetaminophen (TYLENOL) 325 MG tablet Take 650 mg by mouth every 6 (six) hours as needed for headache (pain).    Marland Kitchen albuterol (PROVENTIL HFA;VENTOLIN HFA) 108 (90 Base) MCG/ACT inhaler Inhale 1-2 puffs into the lungs every 4 (four) hours as needed for wheezing or shortness of breath.     Marland Kitchen albuterol (PROVENTIL) (2.5 MG/3ML)  0.083% nebulizer solution Inhale 2.5 mg into the lungs as needed.  1  . alendronate (FOSAMAX) 70 MG tablet Take 70 mg by mouth every Monday.     . busPIRone (BUSPAR) 5 MG tablet Take 5 mg by mouth 3 (three) times daily.  0  . Calcium Citrate-Vitamin D (CALCIUM CITRATE + D) 315-250 MG-UNIT TABS Take 1 tablet by mouth daily.    Marland Kitchen donepezil (ARICEPT) 10 MG tablet Take 10 mg by mouth at bedtime.   1  . escitalopram (LEXAPRO) 10 MG tablet Take 10 mg by mouth at bedtime.  5  . FLOVENT HFA 220 MCG/ACT inhaler Inhale 1 puff into the lungs 2 (two) times daily.  2  . gabapentin (NEURONTIN) 300 MG capsule Take 300 mg by mouth 3 (three) times daily.  1  . Lactobacillus (PROBIOTIC ACIDOPHILUS PO) Take by mouth.    . losartan (COZAAR) 100 MG tablet Take 100 mg by mouth daily.    . memantine (NAMENDA XR) 28 MG CP24 24 hr capsule Take 28 mg by mouth daily.    . montelukast (SINGULAIR) 10 MG tablet Take 10 mg by mouth at bedtime.   5  . probenecid (BENEMID) 500 MG tablet Take 250 mg by mouth daily.  0   No current facility-administered medications for this visit.     Allergies  Allergen Reactions  . Aspirin Other (See Comments)    Triggers asthma    Past Medical History:  Diagnosis Date  .  Asthma   . Colostomy present ([place])    hx/notes 07/17/2010  . Dementia ([place])   . Diabetic neuropathy ([place])   . Hypertension    hx/notes 07/17/2010  . Pneumonia    recent/notes 10/08/2016  . Symptomatic bradycardia 12/15/2017  . Type II diabetes mellitus ([place])    hx/notes 07/17/2010    Social History   Socioeconomic History  . Marital status: Single    Spouse name: Not on file  . Number of children: Not on file  . Years of education: Not on file  . Highest education level: Not on file  Occupational History  . Not on file  Social Needs  . Financial resource strain: Not on file  . Food insecurity:    Worry: Not on file    Inability: Not on file  . Transportation needs:    Medical: Not on file     Non-medical: Not on file  Tobacco Use  . Smoking status: Never Smoker  . Smokeless tobacco: Former Systems developer    Types: Chew  Substance and Sexual Activity  . Alcohol use: No  . Drug use: No  . Sexual activity: Never  Lifestyle  . Physical activity:    Days per week: Not on file    Minutes per session: Not on file  . Stress: Not on file  Relationships  . Social connections:    Talks on phone: Not on file    Gets together: Not on file    Attends religious service: Not on file    Active member of club or organization: Not on file    Attends meetings of clubs or organizations: Not on file    Relationship status: Not on file  . Intimate partner violence:    Fear of current or ex partner: Not on file    Emotionally abused: Not on file    Physically abused: Not on file    Forced sexual activity: Not on file  Other Topics Concern  . Not on file  Social History Narrative  . Not on file     Family History  Problem Relation Age of Onset  . Kidney disease Mother   . Hypertension Mother   . Other Father        gangrene with amputation  . Diabetes Sister   . Cancer Brother   . Heart disease Brother   . Schizophrenia Daughter   . Diabetes Daughter   . Stroke Daughter   . Diabetes Sister   . Breast cancer Neg Hx      Review of Systems: General: negative for chills, fever, night sweats or weight changes.  Cardiovascular: negative for chest pain, dyspnea on exertion, edema, orthopnea, palpitations, paroxysmal nocturnal dyspnea or shortness of breath Dermatological: negative for rash Respiratory: negative for cough or wheezing Urologic: negative for hematuria Abdominal: negative for nausea, vomiting, diarrhea, bright red blood per rectum, melena, or hematemesis Neurologic: negative for visual changes, syncope, or dizziness Pt uses a walker All other systems reviewed and are otherwise negative except as noted above.    Blood pressure 133/72, pulse (!) 56, weight 151 lb 9.6 oz  (68.8 kg), SpO2 98 %.  General appearance: alert, cooperative, no distress and mildly obese Neck: no carotid bruit and no JVD Lungs: clear to auscultation bilaterally Heart: regular rate and rhythm Extremities: no edema Skin: warm and dry Neurologic: Grossly normal  ASSESSMENT AND PLAN:   Bradycardia Pt has bradycardia with heart rates in the 50s.  If not clear that this is  causing her any symptoms.  Essential hypertension Controlled.  Echocardiogram in October 2019 showed normal LV function with grade 2 diastolic dysfunction  Dementia [facility]) Patient has mild dementia.  She is unable to give any history.   PLAN I asked the patient to try and keep a record of Ms. Hadden's pulse.  They should check it at least a few times a week.  They have a follow-up with Dr. Caryl Comes in January.  Kerin Ransom PA-C 03/01/2018 3:34 PM

## 2018-03-01 NOTE — Assessment & Plan Note (Signed)
Patient has mild dementia.  She is unable to give any history.

## 2018-03-01 NOTE — Assessment & Plan Note (Signed)
Pt has bradycardia with heart rates in the 50s.  If not clear that this is causing her any symptoms.

## 2018-03-01 NOTE — Assessment & Plan Note (Signed)
Controlled.  Echocardiogram in October 2019 showed normal LV function with grade 2 diastolic dysfunction

## 2018-03-28 ENCOUNTER — Emergency Department (HOSPITAL_COMMUNITY)
Admission: EM | Admit: 2018-03-28 | Discharge: 2018-03-28 | Disposition: A | Discharge status: Home / Self Care | Payer: Medicare Other | Attending: Emergency Medicine | Admitting: Emergency Medicine

## 2018-03-28 ENCOUNTER — Emergency Department (HOSPITAL_COMMUNITY): Payer: Medicare Other

## 2018-03-28 DIAGNOSIS — R531 Weakness: Secondary | ICD-10-CM | POA: Diagnosis present

## 2018-03-28 DIAGNOSIS — J0100 Acute maxillary sinusitis, unspecified: Secondary | ICD-10-CM | POA: Insufficient documentation

## 2018-03-28 DIAGNOSIS — E1140 Type 2 diabetes mellitus with diabetic neuropathy, unspecified: Secondary | ICD-10-CM | POA: Diagnosis not present

## 2018-03-28 DIAGNOSIS — J45909 Unspecified asthma, uncomplicated: Secondary | ICD-10-CM | POA: Diagnosis not present

## 2018-03-28 DIAGNOSIS — Z79899 Other long term (current) drug therapy: Secondary | ICD-10-CM | POA: Insufficient documentation

## 2018-03-28 DIAGNOSIS — Z933 Colostomy status: Secondary | ICD-10-CM | POA: Diagnosis not present

## 2018-03-28 DIAGNOSIS — I10 Essential (primary) hypertension: Secondary | ICD-10-CM | POA: Insufficient documentation

## 2018-03-28 DIAGNOSIS — F0390 Unspecified dementia without behavioral disturbance: Secondary | ICD-10-CM | POA: Insufficient documentation

## 2018-03-28 LAB — URINALYSIS, ROUTINE W REFLEX MICROSCOPIC
BACTERIA UA: NONE SEEN
Bilirubin Urine: NEGATIVE
Glucose, UA: NEGATIVE mg/dL
KETONES UR: NEGATIVE mg/dL
LEUKOCYTES UA: NEGATIVE
Nitrite: NEGATIVE
PROTEIN: NEGATIVE mg/dL
Specific Gravity, Urine: 1.012 (ref 1.005–1.030)
pH: 6 (ref 5.0–8.0)

## 2018-03-28 LAB — COMPREHENSIVE METABOLIC PANEL
ALT: 10 U/L (ref 0–44)
ANION GAP: 9 (ref 5–15)
AST: 20 U/L (ref 15–41)
Albumin: 3.2 g/dL — ABNORMAL LOW (ref 3.5–5.0)
Alkaline Phosphatase: 70 U/L (ref 38–126)
BUN: 15 mg/dL (ref 8–23)
CHLORIDE: 106 mmol/L (ref 98–111)
CO2: 24 mmol/L (ref 22–32)
Calcium: 9 mg/dL (ref 8.9–10.3)
Creatinine, Ser: 1.17 mg/dL — ABNORMAL HIGH (ref 0.44–1.00)
GFR calc Af Amer: 50 mL/min — ABNORMAL LOW (ref >60)
GFR calc non Af Amer: 43 mL/min — ABNORMAL LOW (ref >60)
GLUCOSE: 128 mg/dL — AB (ref 70–99)
Potassium: 4.1 mmol/L (ref 3.5–5.1)
Sodium: 139 mmol/L (ref 135–145)
Total Bilirubin: 0.4 mg/dL (ref 0.3–1.2)
Total Protein: 6.7 g/dL (ref 6.5–8.1)

## 2018-03-28 LAB — CBC WITH DIFFERENTIAL/PLATELET
Abs Immature Granulocytes: 0.01 10*3/uL (ref 0.00–0.07)
Basophils Absolute: 0 10*3/uL (ref 0.0–0.1)
Basophils Relative: 1 %
EOS ABS: 0.4 10*3/uL (ref 0.0–0.5)
EOS PCT: 8 %
HEMATOCRIT: 38.2 % (ref 36.0–46.0)
Hemoglobin: 12.1 g/dL (ref 12.0–15.0)
IMMATURE GRANULOCYTES: 0 %
LYMPHS ABS: 1.8 10*3/uL (ref 0.7–4.0)
Lymphocytes Relative: 34 %
MCH: 30.7 pg (ref 26.0–34.0)
MCHC: 31.7 g/dL (ref 30.0–36.0)
MCV: 97 fL (ref 80.0–100.0)
MONOS PCT: 7 %
Monocytes Absolute: 0.4 10*3/uL (ref 0.1–1.0)
NEUTROS PCT: 50 %
Neutro Abs: 2.7 10*3/uL (ref 1.7–7.7)
Platelets: 181 10*3/uL (ref 150–400)
RBC: 3.94 MIL/uL (ref 3.87–5.11)
RDW: 12.8 % (ref 11.5–15.5)
WBC: 5.3 10*3/uL (ref 4.0–10.5)
nRBC: 0 % (ref 0.0–0.2)

## 2018-03-28 LAB — TSH: TSH: 2.696 u[IU]/mL (ref 0.350–4.500)

## 2018-03-28 LAB — I-STAT VENOUS BLOOD GAS, ED
Acid-Base Excess: 6 mmol/L — ABNORMAL HIGH (ref 0.0–2.0)
Bicarbonate: 31.4 mmol/L — ABNORMAL HIGH (ref 20.0–28.0)
O2 Saturation: 93 %
TCO2: 33 mmol/L — ABNORMAL HIGH (ref 22–32)
pCO2, Ven: 49.9 mmHg (ref 44.0–60.0)
pH, Ven: 7.407 (ref 7.250–7.430)
pO2, Ven: 68 mmHg — ABNORMAL HIGH (ref 32.0–45.0)

## 2018-03-28 LAB — I-STAT TROPONIN, ED: Troponin i, poc: 0 ng/mL (ref 0.00–0.08)

## 2018-03-28 LAB — TROPONIN I

## 2018-03-28 MED ORDER — AMOXICILLIN-POT CLAVULANATE 875-125 MG PO TABS: 1.0000 | ORAL_TABLET | Freq: Two times a day (BID) | ORAL | 0 refills | Status: AC

## 2018-03-28 NOTE — ED Notes (Signed)
Pt ambulated with walker unassisted around blue zone. Tolerated well.

## 2018-03-28 NOTE — Discharge Instructions (Addendum)
Your lab work today was very reassuring.  You do have a moderate sinus infection, for which I have prescribed antibiotics.  If you continue to feel unwell, I recommend following up with your primary doctor in the next 2 to 3 days.  Take the antibiotics as prescribed.  Drink plenty of fluids.

## 2018-03-28 NOTE — ED Triage Notes (Signed)
Per [facility]: Patient to ED from home with initial c/o generalized weakness - per EMS, family stated that they had trouble getting her to wake up after repeatedly calling her name. Patient was lethargic upon waking, but has been A&O x 4. She currently has no complaints other than feeling tired, denies shortness of breath, pain, N/V, fevers/chills.

## 2018-03-28 NOTE — ED Provider Notes (Signed)
[facility] EMERGENCY DEPARTMENT Provider Note   CSN: [id number] Arrival date & time: 03/28/18  1614     History   Chief Complaint Chief Complaint  Patient presents with  . Weakness    HPI [name] is a 85 y.o. female.  HPI   85 yo F with PMhx as below here with brief AMS. Per report, pt was in her usual state of health until this afternoon. She had been resting on the couch and granddaughter came to wake her up. Per report, pt's granddaughter was unable to wake her up so EMS called. On EMS arrival, pt in NAD, AOx4 with vigorous awakening. Has remained awake, alert since then. Bradycardic but o/w stable, which is a known issue for her. She states she feels "just fine" and denies complaints on my assessment.  Level 5 caveat invoked as remainder of history, ROS, and physical exam limited due to patient's dementia.   Past Medical History:  Diagnosis Date  . Asthma   . Colostomy present ([place])    hx/notes 07/17/2010  . Dementia ([place])   . Diabetic neuropathy ([place])   . Hypertension    hx/notes 07/17/2010  . Pneumonia    recent/notes 10/08/2016  . Symptomatic bradycardia 12/15/2017  . Type II diabetes mellitus ([place])    hx/notes 07/17/2010    Patient Active Problem List   Diagnosis Date Noted  . Essential hypertension 03/01/2018  . COPD (chronic obstructive pulmonary disease) ([place]) 03/01/2018  . Bradycardia 12/15/2017  . AKI (acute kidney injury) ([place]) 07/18/2017  . Colostomy present ([place]) 11/08/2016  . Syncope 10/08/2016  . Acute kidney injury ([place]) 10/08/2016  . Abnormal chest x-ray 10/08/2016  . Lethargy 10/08/2016  . Dementia ([place])   . Sepsis ([place]) 09/05/2016  . Community acquired pneumonia 09/05/2016  . Toxic metabolic encephalopathy 32/44/0102  . Diabetes mellitus with complication ([place]) 72/53/6644  . Nausea & vomiting 04/09/2012  . Gallstones 03/15/2011  . Partial SBO 03/15/2011  . Colostomy hernia ([place]) 03/15/2011  . PSEUDOGOUT 02/28/2007    . IRON DEFIC ANEMIA [place] DIET IRON INTAKE 02/28/2007  . DISCITIS 02/28/2007  . OSTEOMYELITIS 02/28/2007  . CHILLS WITHOUT FEVER 02/28/2007    Past Surgical History:  Procedure Laterality Date  . ABDOMINAL HYSTERECTOMY    . COLOSTOMY     S/P SBO hx/notes 07/17/2010  . LUMBAR FUSION  11/2006   hx/notes 07/17/2010  . NASAL SINUS SURGERY  04/28/2002   Bilateral inferior turbinate reductions, bilateral maxillary antrotomies, bilateral total ethmoidectomies, bilateral frontal recess explorations, bilateral sphenoidotomies, Instatrac guidance./notes 07/28/2010     OB History   No obstetric history on file.      Home Medications    Prior to Admission medications   Medication Sig Start Date End Date Taking? Authorizing Provider  acetaminophen (TYLENOL) 325 MG tablet Take 650 mg by mouth every 6 (six) hours as needed for headache (pain).    [provider]  albuterol (PROVENTIL HFA;VENTOLIN HFA) 108 (90 Base) MCG/ACT inhaler Inhale 1-2 puffs into the lungs every 4 (four) hours as needed for wheezing or shortness of breath.     [provider]  albuterol (PROVENTIL) (2.5 MG/3ML) 0.083% nebulizer solution Inhale 2.5 mg into the lungs as needed. 05/26/17   [provider]  alendronate (FOSAMAX) 70 MG tablet Take 70 mg by mouth every Monday.     [provider]  amoxicillin-clavulanate (AUGMENTIN) 875-125 MG tablet Take 1 tablet by mouth every 12 (twelve) hours for 10 days. 03/28/18 04/07/18  Duffy Bruce, MD  busPIRone (BUSPAR) 5 MG tablet Take 5 mg by mouth 3 (three) times daily. 11/24/17   [provider]  Calcium Citrate-Vitamin D (CALCIUM CITRATE + D) 315-250 MG-UNIT TABS Take 1 tablet by mouth daily.    [provider]  donepezil (ARICEPT) 10 MG tablet Take 10 mg by mouth at bedtime.  02/24/15   [provider]  escitalopram (LEXAPRO) 10 MG tablet Take 10 mg by mouth at bedtime. 06/16/17   [provider]  FLOVENT HFA 220  MCG/ACT inhaler Inhale 1 puff into the lungs 2 (two) times daily. 05/28/17   [provider]  gabapentin (NEURONTIN) 300 MG capsule Take 300 mg by mouth 3 (three) times daily. 10/12/15   [provider]  Lactobacillus (PROBIOTIC ACIDOPHILUS PO) Take by mouth.    [provider]  losartan (COZAAR) 100 MG tablet Take 100 mg by mouth daily.    [provider]  memantine (NAMENDA XR) 28 MG CP24 24 hr capsule Take 28 mg by mouth daily.    [provider]  montelukast (SINGULAIR) 10 MG tablet Take 10 mg by mouth at bedtime.  06/21/17   [provider]  probenecid (BENEMID) 500 MG tablet Take 250 mg by mouth daily. 10/12/16   [provider]    Family History Family History  Problem Relation Age of Onset  . Kidney disease Mother   . Hypertension Mother   . Other Father        gangrene with amputation  . Diabetes Sister   . Cancer Brother   . Heart disease Brother   . Schizophrenia Daughter   . Diabetes Daughter   . Stroke Daughter   . Diabetes Sister   . Breast cancer Neg Hx     Social History Social History   Tobacco Use  . Smoking status: Never Smoker  . Smokeless tobacco: Former Systems developer    Types: Chew  Substance Use Topics  . Alcohol use: No  . Drug use: No     Allergies   Aspirin   Review of Systems Review of Systems  Constitutional: Positive for fatigue. Negative for chills and fever.  HENT: Negative for congestion and rhinorrhea.   Eyes: Negative for visual disturbance.  Respiratory: Negative for cough, shortness of breath and wheezing.   Cardiovascular: Negative for chest pain and leg swelling.  Gastrointestinal: Negative for abdominal pain, diarrhea, nausea and vomiting.  Genitourinary: Negative for dysuria and flank pain.  Musculoskeletal: Negative for neck pain and neck stiffness.  Skin: Negative for rash and wound.  Allergic/Immunologic: Negative for immunocompromised state.  Neurological: Negative for  syncope, weakness and headaches.  Psychiatric/Behavioral: Positive for confusion.  All other systems reviewed and are negative.    Physical Exam Updated Vital Signs BP (!) 155/94   Pulse 76   Temp 98.4 F (36.9 C) (Oral)   Resp 16   SpO2 98%   Physical Exam Vitals signs and nursing note reviewed.  Constitutional:      General: She is not in acute distress.    Appearance: She is well-developed.  HENT:     Head: Normocephalic and atraumatic.     Comments: Mild tenderness over maxillary sinus.  No facial swelling or asymmetry.  No edema. Eyes:     Conjunctiva/sclera: Conjunctivae normal.  Neck:     Musculoskeletal: Neck supple.  Cardiovascular:     Rate and Rhythm: Normal rate and regular rhythm.     Heart sounds: Normal heart sounds. No murmur.  No friction rub.  Pulmonary:     Effort: Pulmonary effort is normal. No respiratory distress.     Breath sounds: Normal breath sounds. No wheezing or rales.  Abdominal:     General: There is no distension.     Palpations: Abdomen is soft.     Tenderness: There is no abdominal tenderness.  Skin:    General: Skin is warm.     Capillary Refill: Capillary refill takes less than 2 seconds.  Neurological:     Mental Status: She is alert.     Motor: No abnormal muscle tone.     Comments: Oriented to person, place, but not time, though she knows the year.  Moves all extremities with 5 and 5 strength.  Normal sensation to light touch in distal upper and lower extremities bilaterally.  Cranial nerves intact.      ED Treatments / Results  Labs (all labs ordered are listed, but only abnormal results are displayed) Labs Reviewed  COMPREHENSIVE METABOLIC PANEL - Abnormal; Notable for the following components:      Result Value   Glucose, Bld 128 (*)    Creatinine, Ser 1.17 (*)    Albumin 3.2 (*)    GFR calc non Af Amer 43 (*)    GFR calc Af Amer 50 (*)    All other components within normal limits  URINALYSIS, ROUTINE W REFLEX  MICROSCOPIC - Abnormal; Notable for the following components:   Hgb urine dipstick SMALL (*)    All other components within normal limits  I-STAT VENOUS BLOOD GAS, ED - Abnormal; Notable for the following components:   pO2, Ven 68.0 (*)    Bicarbonate 31.4 (*)    TCO2 33 (*)    Acid-Base Excess 6.0 (*)    All other components within normal limits  CBC WITH DIFFERENTIAL/PLATELET  TROPONIN I  TSH  I-STAT TROPONIN, ED    EKG EKG Interpretation  Date/Time:  Monday March 28 2018 16:20:40 EST Ventricular Rate:  53 PR Interval:    QRS Duration: 99 QT Interval:  463 QTC Calculation: 435 R Axis:   -36 Text Interpretation:  Sinus rhythm Left axis deviation Low voltage, precordial leads No significant change since last tracing Mild baseline wander Confirmed by Duffy Bruce [phone]) on 03/28/2018 4:40:40 PM   Radiology Dg Chest 2 View  Result Date: 03/28/2018 CLINICAL DATA:  Lethargy EXAM: CHEST - 2 VIEW COMPARISON:  December 13, 2017 FINDINGS: The lungs are clear. Heart is mildly enlarged with pulmonary vascularity normal. No adenopathy. There is degenerative change in the thoracic spine. There are scattered foci of coronary artery calcification. IMPRESSION: Mild cardiomegaly. No edema or consolidation. There are scattered foci of coronary artery calcification. Electronically Signed   By: Lowella Grip III M.D.   On: 03/28/2018 18:19   Ct Head Wo Contrast  Result Date: 03/28/2018 CLINICAL DATA:  Generalized weakness and altered mental status. EXAM: CT HEAD WITHOUT CONTRAST TECHNIQUE: Contiguous axial images were obtained from the base of the skull through the vertex without intravenous contrast. COMPARISON:  Head CT 07/08/2017 FINDINGS: Brain: There is no mass, hemorrhage or extra-axial collection. The size and configuration of the ventricles and extra-axial CSF spaces are normal. The brain parenchyma is normal, without evidence of acute or chronic infarction. Vascular: No abnormal  hyperdensity of the major intracranial arteries or dural venous sinuses. No intracranial atherosclerosis. Skull: The visualized skull base, calvarium and extracranial soft tissues are normal. Sinuses/Orbits: There is diffuse severe mucosal thickening of the paranasal sinuses,  little changed compared to 07/08/2017. The orbits are normal. IMPRESSION: 1. No acute intracranial abnormality. 2. Severe chronic sinusitis. Electronically Signed   By: Ulyses Jarred M.D.   On: 03/28/2018 17:32    Procedures Procedures (including critical care time)  Medications Ordered in ED Medications - No data to display   Initial Impression / Assessment and Plan / ED Course  I have reviewed the triage vital signs and the nursing notes.  Pertinent labs & imaging results that were available during my care of the patient were reviewed by me and considered in my medical decision making (see chart for details).     85 year old female, very well-appearing, here with report of feeling generally weak.  History somewhat limited due to mild dementia, but I had a discussion with the patient's granddaughter, who states that she was sent here just to be "checked out."  Patient is hemodynamically stable here and very well-appearing.  She does complain of mild sinus pressure, but otherwise is without complaints.  Full imaging and labs obtained as above.  EKG nonischemic and troponin negative x2 and symptoms are not consistent with ischemia or ACS.  CT head shows chronic sinusitis but otherwise is unremarkable.  Chest x-ray clear.  Lab work is reassuring with baseline, normal CBC and electrolytes.  Patient was monitored in the ED with mild bradycardia which is her baseline, but no severe bradycardia or hypotension or hemodynamic instability.  This is a known, well-documented issue.  Will start her on Augmentin for possible sinusitis, and have her follow-up with her primary care doctor.  No apparent emergent pathology identified at this  time.  Final Clinical Impressions(s) / ED Diagnoses   Final diagnoses:  Subacute maxillary sinusitis  Weakness    ED Discharge Orders         Ordered    amoxicillin-clavulanate (AUGMENTIN) 875-125 MG tablet  Every 12 hours     03/28/18 2021           Duffy Bruce, MD 03/28/18 2024

## 2018-03-31 ENCOUNTER — Encounter

## 2018-03-31 ENCOUNTER — Ambulatory Visit: Payer: Self-pay | Admitting: Neurology

## 2018-04-06 ENCOUNTER — Ambulatory Visit: Payer: Self-pay | Admitting: Internal Medicine

## 2018-04-07 ENCOUNTER — Encounter: Payer: Self-pay | Admitting: Internal Medicine

## 2018-04-08 ENCOUNTER — Ambulatory Visit: Payer: Self-pay | Admitting: Pulmonary Disease

## 2018-04-09 ENCOUNTER — Encounter (HOSPITAL_COMMUNITY): Payer: Self-pay | Admitting: Emergency Medicine

## 2018-04-09 ENCOUNTER — Other Ambulatory Visit: Payer: Self-pay

## 2018-04-09 ENCOUNTER — Emergency Department (HOSPITAL_COMMUNITY): Payer: Medicare Other

## 2018-04-09 ENCOUNTER — Observation Stay (HOSPITAL_COMMUNITY)
Admission: EM | Admit: 2018-04-09 | Discharge: 2018-04-11 | Disposition: A | Discharge status: Home / Self Care | Payer: Medicare Other | Attending: Family Medicine | Admitting: Family Medicine

## 2018-04-09 DIAGNOSIS — N179 Acute kidney failure, unspecified: Secondary | ICD-10-CM | POA: Diagnosis not present

## 2018-04-09 DIAGNOSIS — Z886 Allergy status to analgesic agent status: Secondary | ICD-10-CM | POA: Insufficient documentation

## 2018-04-09 DIAGNOSIS — M48061 Spinal stenosis, lumbar region without neurogenic claudication: Secondary | ICD-10-CM | POA: Insufficient documentation

## 2018-04-09 DIAGNOSIS — Z87891 Personal history of nicotine dependence: Secondary | ICD-10-CM | POA: Insufficient documentation

## 2018-04-09 DIAGNOSIS — I119 Hypertensive heart disease without heart failure: Secondary | ICD-10-CM | POA: Insufficient documentation

## 2018-04-09 DIAGNOSIS — Z933 Colostomy status: Secondary | ICD-10-CM | POA: Diagnosis not present

## 2018-04-09 DIAGNOSIS — J111 Influenza due to unidentified influenza virus with other respiratory manifestations: Secondary | ICD-10-CM

## 2018-04-09 DIAGNOSIS — J101 Influenza due to other identified influenza virus with other respiratory manifestations: Secondary | ICD-10-CM | POA: Diagnosis not present

## 2018-04-09 DIAGNOSIS — R9431 Abnormal electrocardiogram [ECG] [EKG]: Secondary | ICD-10-CM | POA: Insufficient documentation

## 2018-04-09 DIAGNOSIS — F0151 Vascular dementia with behavioral disturbance: Secondary | ICD-10-CM | POA: Insufficient documentation

## 2018-04-09 DIAGNOSIS — I6782 Cerebral ischemia: Secondary | ICD-10-CM | POA: Insufficient documentation

## 2018-04-09 DIAGNOSIS — G319 Degenerative disease of nervous system, unspecified: Secondary | ICD-10-CM | POA: Insufficient documentation

## 2018-04-09 DIAGNOSIS — J449 Chronic obstructive pulmonary disease, unspecified: Secondary | ICD-10-CM | POA: Insufficient documentation

## 2018-04-09 DIAGNOSIS — F0391 Unspecified dementia with behavioral disturbance: Secondary | ICD-10-CM | POA: Diagnosis not present

## 2018-04-09 DIAGNOSIS — R531 Weakness: Secondary | ICD-10-CM | POA: Diagnosis present

## 2018-04-09 DIAGNOSIS — M6281 Muscle weakness (generalized): Secondary | ICD-10-CM | POA: Insufficient documentation

## 2018-04-09 DIAGNOSIS — Z833 Family history of diabetes mellitus: Secondary | ICD-10-CM | POA: Insufficient documentation

## 2018-04-09 DIAGNOSIS — R2681 Unsteadiness on feet: Secondary | ICD-10-CM | POA: Diagnosis present

## 2018-04-09 DIAGNOSIS — R001 Bradycardia, unspecified: Secondary | ICD-10-CM | POA: Diagnosis not present

## 2018-04-09 DIAGNOSIS — E1140 Type 2 diabetes mellitus with diabetic neuropathy, unspecified: Secondary | ICD-10-CM | POA: Insufficient documentation

## 2018-04-09 DIAGNOSIS — Z8249 Family history of ischemic heart disease and other diseases of the circulatory system: Secondary | ICD-10-CM | POA: Insufficient documentation

## 2018-04-09 DIAGNOSIS — Z79899 Other long term (current) drug therapy: Secondary | ICD-10-CM | POA: Insufficient documentation

## 2018-04-09 DIAGNOSIS — I10 Essential (primary) hypertension: Secondary | ICD-10-CM | POA: Diagnosis present

## 2018-04-09 DIAGNOSIS — R262 Difficulty in walking, not elsewhere classified: Secondary | ICD-10-CM | POA: Insufficient documentation

## 2018-04-09 DIAGNOSIS — F0390 Unspecified dementia without behavioral disturbance: Secondary | ICD-10-CM | POA: Diagnosis present

## 2018-04-09 NOTE — ED Notes (Signed)
RN attempted for IV x2 without success

## 2018-04-09 NOTE — ED Triage Notes (Signed)
Per [facility], Pt from home with granddaughter. Pt reports intermittent weakness. Per [facility], Pt has had generalized weakness, unsteady with walking, and incontinent of urine. Pt alert and oriented x3, disoriented to time, hx of dementia. Per granddaughter pt is at baseline mental status. VSS. Pt reports she recently had a cold with non-productive cough, denies nasal drainage.

## 2018-04-09 NOTE — ED Provider Notes (Signed)
[facility] EMERGENCY DEPARTMENT Provider Note   CSN: [id number] Arrival date & time: 04/09/18  2216     History   Chief Complaint Chief Complaint  Patient presents with  . Weakness    HPI [name] is a 85 y.o. female.  Level 5 caveat for dementia.  Patient from home by EMS with "not feeling well" for the past 2 days.  EMS reports generalized weakness, unsteadiness with walking and incontinence of urine.  Patient denies any pain.  She is oriented to person and place.  Denies any falls.  No nausea, vomiting, diarrhea, fever.  Has had productive cough of clear mucus.  Has had nasal drainage and sore throat.  Recently treated for sinus infection on January 13.  No chest pain or abdominal pain.  Discussed with patient's granddaughter [name] by phone.  She reports that patient has had more difficulty walking over the past 2 days and stumbling.  Is also been incontinent of urine, has increased confusion and increased sleepiness.  No documented fever.  Multiple sick contacts at home.  The history is provided by the EMS personnel, the patient and a relative.  Weakness  Associated symptoms: cough, headaches and urgency   Associated symptoms: no abdominal pain, no arthralgias, no chest pain, no dizziness, no dysuria, no fever, no myalgias, no nausea, no shortness of breath and no vomiting     Past Medical History:  Diagnosis Date  . Asthma   . Colostomy present ([employer])    hx/notes 07/17/2010  . Dementia ([place])   . Diabetic neuropathy ([place])   . Hypertension    hx/notes 07/17/2010  . Pneumonia    recent/notes 10/08/2016  . Symptomatic bradycardia 12/15/2017  . Type II diabetes mellitus ([place])    hx/notes 07/17/2010    Patient Active Problem List   Diagnosis Date Noted  . Essential hypertension 03/01/2018  . COPD (chronic obstructive pulmonary disease) ([place]) 03/01/2018  . Bradycardia 12/15/2017  . AKI (acute kidney injury) ([place]) 07/18/2017  . Colostomy present ([place])  11/08/2016  . Syncope 10/08/2016  . Acute kidney injury ([place]) 10/08/2016  . Abnormal chest x-ray 10/08/2016  . Lethargy 10/08/2016  . Dementia ([place])   . Sepsis ([place]) 09/05/2016  . Community acquired pneumonia 09/05/2016  . Toxic metabolic encephalopathy 43/32/9518  . Diabetes mellitus with complication ([place]) 84/16/6063  . Nausea & vomiting 04/09/2012  . Gallstones 03/15/2011  . Partial SBO 03/15/2011  . Colostomy hernia ([place]) 03/15/2011  . PSEUDOGOUT 02/28/2007  . IRON DEFIC ANEMIA [place] DIET IRON INTAKE 02/28/2007  . DISCITIS 02/28/2007  . OSTEOMYELITIS 02/28/2007  . CHILLS WITHOUT FEVER 02/28/2007    Past Surgical History:  Procedure Laterality Date  . ABDOMINAL HYSTERECTOMY    . COLOSTOMY     S/P SBO hx/notes 07/17/2010  . LUMBAR FUSION  11/2006   hx/notes 07/17/2010  . NASAL SINUS SURGERY  04/28/2002   Bilateral inferior turbinate reductions, bilateral maxillary antrotomies, bilateral total ethmoidectomies, bilateral frontal recess explorations, bilateral sphenoidotomies, Instatrac guidance./notes 07/28/2010     OB History   No obstetric history on file.      Home Medications    Prior to Admission medications   Medication Sig Start Date End Date Taking? Authorizing Provider  acetaminophen (TYLENOL) 325 MG tablet Take 650 mg by mouth every 6 (six) hours as needed for headache (pain).    [provider]  albuterol (PROVENTIL HFA;VENTOLIN HFA) 108 (90 Base) MCG/ACT inhaler Inhale 1-2 puffs into the lungs every 4 (four) hours as  needed for wheezing or shortness of breath.     [provider]  albuterol (PROVENTIL) (2.5 MG/3ML) 0.083% nebulizer solution Inhale 2.5 mg into the lungs as needed. 05/26/17   [provider]  alendronate (FOSAMAX) 70 MG tablet Take 70 mg by mouth every Monday.     [provider]  busPIRone (BUSPAR) 5 MG tablet Take 5 mg by mouth 3 (three) times daily. 11/24/17   [provider]  Calcium Citrate-Vitamin D  (CALCIUM CITRATE + D) 315-250 MG-UNIT TABS Take 1 tablet by mouth daily.    [provider]  donepezil (ARICEPT) 10 MG tablet Take 10 mg by mouth at bedtime.  02/24/15   [provider]  escitalopram (LEXAPRO) 10 MG tablet Take 10 mg by mouth at bedtime. 06/16/17   [provider]  FLOVENT HFA 220 MCG/ACT inhaler Inhale 1 puff into the lungs 2 (two) times daily. 05/28/17   [provider]  gabapentin (NEURONTIN) 300 MG capsule Take 300 mg by mouth 3 (three) times daily. 10/12/15   [provider]  Lactobacillus (PROBIOTIC ACIDOPHILUS PO) Take by mouth.    [provider]  losartan (COZAAR) 100 MG tablet Take 100 mg by mouth daily.    [provider]  memantine (NAMENDA XR) 28 MG CP24 24 hr capsule Take 28 mg by mouth daily.    [provider]  montelukast (SINGULAIR) 10 MG tablet Take 10 mg by mouth at bedtime.  06/21/17   [provider]  probenecid (BENEMID) 500 MG tablet Take 250 mg by mouth daily. 10/12/16   [provider]    Family History Family History  Problem Relation Age of Onset  . Kidney disease Mother   . Hypertension Mother   . Other Father        gangrene with amputation  . Diabetes Sister   . Cancer Brother   . Heart disease Brother   . Schizophrenia Daughter   . Diabetes Daughter   . Stroke Daughter   . Diabetes Sister   . Breast cancer Neg Hx     Social History Social History   Tobacco Use  . Smoking status: Never Smoker  . Smokeless tobacco: Former Systems developer    Types: Chew  Substance Use Topics  . Alcohol use: No  . Drug use: No     Allergies   Aspirin   Review of Systems Review of Systems  Constitutional: Negative for activity change, appetite change and fever.  HENT: Positive for rhinorrhea and sinus pressure.   Respiratory: Positive for cough. Negative for chest tightness and shortness of breath.   Cardiovascular: Negative for chest pain.  Gastrointestinal: Negative  for abdominal pain, nausea and vomiting.  Genitourinary: Positive for difficulty urinating and urgency. Negative for dysuria.  Musculoskeletal: Negative for arthralgias and myalgias.  Neurological: Positive for weakness and headaches. Negative for dizziness and light-headedness.   all other systems are negative except as noted in the HPI and PMH.     Physical Exam Updated Vital Signs BP (!) 122/58   Pulse 60   Resp 19   SpO2 100%   Physical Exam Vitals signs and nursing note reviewed.  Constitutional:      General: She is not in acute distress.    Appearance: She is well-developed.     Comments: Oriented to person and place  HENT:     Head: Normocephalic and atraumatic.     Right Ear: Tympanic membrane normal.     Left Ear: Tympanic membrane  normal.     Nose: Congestion present.     Mouth/Throat:     Mouth: Mucous membranes are moist.     Pharynx: No oropharyngeal exudate or posterior oropharyngeal erythema.  Eyes:     Conjunctiva/sclera: Conjunctivae normal.     Pupils: Pupils are equal, round, and reactive to light.  Neck:     Musculoskeletal: Normal range of motion and neck supple.     Comments: No meningismus. Cardiovascular:     Rate and Rhythm: Normal rate and regular rhythm.     Heart sounds: Normal heart sounds. No murmur.  Pulmonary:     Effort: Pulmonary effort is normal. No respiratory distress.     Breath sounds: Normal breath sounds.  Abdominal:     Palpations: Abdomen is soft.     Tenderness: There is no abdominal tenderness. There is no guarding or rebound.     Comments: LLQ colostomy. Abdomen soft. NTND  Musculoskeletal: Normal range of motion.        General: No tenderness.  Skin:    General: Skin is warm.     Capillary Refill: Capillary refill takes less than 2 seconds.  Neurological:     General: No focal deficit present.     Mental Status: She is alert.     Cranial Nerves: No cranial nerve deficit.     Motor: No abnormal muscle tone.      Coordination: Coordination normal.     Comments: Cranial nerves II to XII intact, 5/5 strength throughout, no facial asymmetry.  No ataxia in finger-to-nose.  No pronator drift.  Psychiatric:        Behavior: Behavior normal.      ED Treatments / Results  Labs (all labs ordered are listed, but only abnormal results are displayed) Labs Reviewed  BASIC METABOLIC PANEL - Abnormal; Notable for the following components:      Result Value   Glucose, Bld 136 (*)    Creatinine, Ser 1.41 (*)    GFR calc non Af Amer 35 (*)    GFR calc Af Amer 40 (*)    All other components within normal limits  URINALYSIS, ROUTINE W REFLEX MICROSCOPIC - Abnormal; Notable for the following components:   Hgb urine dipstick SMALL (*)    All other components within normal limits  INFLUENZA PANEL BY PCR (TYPE A & B) - Abnormal; Notable for the following components:   Influenza B By PCR POSITIVE (*)    All other components within normal limits  CBC  TROPONIN I  BRAIN NATRIURETIC PEPTIDE    EKG EKG Interpretation  Date/Time:  Saturday April 09 2018 22:24:21 EST Ventricular Rate:  60 PR Interval:    QRS Duration: 98 QT Interval:  434 QTC Calculation: 434 R Axis:   -43 Text Interpretation:  Sinus rhythm Left axis deviation Nonspecific T wave abnormality Confirmed by Lajean Saver [phone]) on 04/09/2018 10:29:45 PM   Radiology Dg Chest 2 View  Result Date: 04/10/2018 CLINICAL DATA:  Cough. EXAM: CHEST - 2 VIEW COMPARISON:  03/28/2018 FINDINGS: The cardiomediastinal contours are unchanged with mild cardiomegaly. Pulmonary vasculature is normal. No consolidation, pleural effusion, or pneumothorax. No acute osseous abnormalities are seen. Chronic degenerative change of both shoulders. Degenerative change in the spine. IMPRESSION: No acute chest findings.  Chronic mild cardiomegaly. Electronically Signed   By: Keith Rake M.D.   On: 04/10/2018 00:16   Ct Head Wo Contrast  Result Date:  04/10/2018 CLINICAL DATA:  Encephalopathy. Generalized weakness. EXAM: CT HEAD WITHOUT  CONTRAST TECHNIQUE: Contiguous axial images were obtained from the base of the skull through the vertex without intravenous contrast. COMPARISON:  Head CT 03/28/2018, brain MRI 11/22/2017, additional prior exams. FINDINGS: Brain: Unchanged degree of atrophy and chronic small vessel ischemia no intracranial hemorrhage, mass effect, or midline shift. No hydrocephalus. Crowding of the basilar cisterns with cerebellar ectopia is chronic. Chronic empty sella no evidence of territorial infarct or acute ischemia. No extra-axial or intracranial fluid collection. Vascular: Atherosclerosis of skullbase vasculature without hyperdense vessel or abnormal calcification. Skull: No fracture or focal lesion. Sinuses/Orbits: Chronic pan paranasal sinus mucosal thickening and prior sinus surgery. Stable chronic calcification in the region of the optic nerves. No acute findings. Other: None. IMPRESSION: 1. No acute intracranial abnormality. 2. Stable atrophy and chronic small vessel ischemia. Electronically Signed   By: Keith Rake M.D.   On: 04/10/2018 00:07    Procedures Procedures (including critical care time)  Medications Ordered in ED Medications - No data to display   Initial Impression / Assessment and Plan / ED Course  I have reviewed the triage vital signs and the nursing notes.  Pertinent labs & imaging results that were available during my care of the patient were reviewed by me and considered in my medical decision making (see chart for details).    Patient with 2 days of generalized weakness, confusion, urinary incontinence.  Denies pain. Stable vitals with no fever.   CT head stable. CXR negative.  Orthostatics negative.  Very unsteady with standing. Ambulation not tested.   Labs with AKI. +Flu B. tamiflu started.  Patient with apparent delirium from influenza. UA negative.  Unable to safely ambulate. More  confused than baseline.  Observation admission d/w Dr. Alcario Drought Final Clinical Impressions(s) / ED Diagnoses   Final diagnoses:  Generalized weakness  Influenza  Unsteady gait    ED Discharge Orders    None       Orry Sigl, Annie Main, MD 04/10/18 [phone]

## 2018-04-10 DIAGNOSIS — R531 Weakness: Secondary | ICD-10-CM | POA: Diagnosis not present

## 2018-04-10 DIAGNOSIS — J101 Influenza due to other identified influenza virus with other respiratory manifestations: Secondary | ICD-10-CM | POA: Diagnosis not present

## 2018-04-10 DIAGNOSIS — F0150 Vascular dementia without behavioral disturbance: Secondary | ICD-10-CM

## 2018-04-10 DIAGNOSIS — R2681 Unsteadiness on feet: Secondary | ICD-10-CM

## 2018-04-10 DIAGNOSIS — N179 Acute kidney failure, unspecified: Secondary | ICD-10-CM

## 2018-04-10 DIAGNOSIS — I10 Essential (primary) hypertension: Secondary | ICD-10-CM

## 2018-04-10 LAB — URINALYSIS, ROUTINE W REFLEX MICROSCOPIC
Bacteria, UA: NONE SEEN
Bilirubin Urine: NEGATIVE
Glucose, UA: NEGATIVE mg/dL
Ketones, ur: NEGATIVE mg/dL
Leukocytes, UA: NEGATIVE
Nitrite: NEGATIVE
Protein, ur: NEGATIVE mg/dL
SPECIFIC GRAVITY, URINE: 1.016 (ref 1.005–1.030)
pH: 5 (ref 5.0–8.0)

## 2018-04-10 LAB — BASIC METABOLIC PANEL
Anion gap: 9 (ref 5–15)
BUN: 22 mg/dL (ref 8–23)
CALCIUM: 8.9 mg/dL (ref 8.9–10.3)
CO2: 23 mmol/L (ref 22–32)
Chloride: 103 mmol/L (ref 98–111)
Creatinine, Ser: 1.41 mg/dL — ABNORMAL HIGH (ref 0.44–1.00)
GFR calc Af Amer: 40 mL/min — ABNORMAL LOW (ref >60)
GFR calc non Af Amer: 35 mL/min — ABNORMAL LOW (ref >60)
Glucose, Bld: 136 mg/dL — ABNORMAL HIGH (ref 70–99)
Potassium: 3.9 mmol/L (ref 3.5–5.1)
Sodium: 135 mmol/L (ref 135–145)

## 2018-04-10 LAB — INFLUENZA PANEL BY PCR (TYPE A & B)
Influenza A By PCR: NEGATIVE
Influenza B By PCR: POSITIVE — AB

## 2018-04-10 LAB — CBC
HCT: 38.7 % (ref 36.0–46.0)
Hemoglobin: 12 g/dL (ref 12.0–15.0)
MCH: 30 pg (ref 26.0–34.0)
MCHC: 31 g/dL (ref 30.0–36.0)
MCV: 96.8 fL (ref 80.0–100.0)
PLATELETS: 163 10*3/uL (ref 150–400)
RBC: 4 MIL/uL (ref 3.87–5.11)
RDW: 12.9 % (ref 11.5–15.5)
WBC: 5.1 10*3/uL (ref 4.0–10.5)
nRBC: 0 % (ref 0.0–0.2)

## 2018-04-10 LAB — BRAIN NATRIURETIC PEPTIDE: B Natriuretic Peptide: 36.1 pg/mL (ref 0.0–100.0)

## 2018-04-10 LAB — TROPONIN I: Troponin I: <0.03 ng/mL (ref <0.03)

## 2018-04-10 MED ORDER — ENOXAPARIN SODIUM 30 MG/0.3ML ~~LOC~~ SOLN
30.0000 mg | SUBCUTANEOUS | Status: DC
  Administered 2018-04-10: 30 mg via SUBCUTANEOUS
  Filled 2018-04-10: qty 0.3

## 2018-04-10 MED ORDER — HYDRALAZINE HCL 20 MG/ML IJ SOLN: 20.0000 mg | INTRAMUSCULAR | Status: DC | PRN

## 2018-04-10 MED ORDER — ONDANSETRON HCL 4 MG PO TABS: 4.0000 mg | ORAL_TABLET | Freq: Four times a day (QID) | ORAL | Status: DC | PRN

## 2018-04-10 MED ORDER — ALBUTEROL SULFATE (2.5 MG/3ML) 0.083% IN NEBU
2.5000 mg | INHALATION_SOLUTION | RESPIRATORY_TRACT | Status: DC | PRN
  Administered 2018-04-10 – 2018-04-11 (×2): 2.5 mg via RESPIRATORY_TRACT
  Filled 2018-04-10 (×2): qty 3

## 2018-04-10 MED ORDER — SODIUM CHLORIDE 0.9 % IV SOLN
INTRAVENOUS | Status: DC
  Administered 2018-04-10: 04:00:00 via INTRAVENOUS

## 2018-04-10 MED ORDER — ACETAMINOPHEN 325 MG PO TABS
650.0000 mg | ORAL_TABLET | Freq: Four times a day (QID) | ORAL | Status: DC | PRN
  Administered 2018-04-10: 650 mg via ORAL
  Filled 2018-04-10: qty 2

## 2018-04-10 MED ORDER — OSELTAMIVIR PHOSPHATE 30 MG PO CAPS
30.0000 mg | ORAL_CAPSULE | Freq: Every day | ORAL | Status: DC
  Administered 2018-04-10: 30 mg via ORAL
  Filled 2018-04-10: qty 1

## 2018-04-10 MED ORDER — ONDANSETRON HCL 4 MG/2ML IJ SOLN: 4.0000 mg | Freq: Four times a day (QID) | INTRAMUSCULAR | Status: DC | PRN

## 2018-04-10 MED ORDER — SODIUM CHLORIDE 0.9 % IV SOLN
INTRAVENOUS | Status: DC
  Administered 2018-04-10: 03:00:00 via INTRAVENOUS

## 2018-04-10 MED ORDER — OSELTAMIVIR PHOSPHATE 30 MG PO CAPS
30.0000 mg | ORAL_CAPSULE | Freq: Once | ORAL | Status: AC
  Administered 2018-04-10: 30 mg via ORAL
  Filled 2018-04-10: qty 1

## 2018-04-10 MED ORDER — ACETAMINOPHEN 650 MG RE SUPP: 650.0000 mg | Freq: Four times a day (QID) | RECTAL | Status: DC | PRN

## 2018-04-10 NOTE — Plan of Care (Signed)
  Problem: Pain Managment: Goal: General experience of comfort will improve Outcome: Progressing   Problem: Elimination: Goal: Will not experience complications related to bowel motility Outcome: Progressing Goal: Will not experience complications related to urinary retention Outcome: Progressing   

## 2018-04-10 NOTE — ED Notes (Signed)
IV team at bedside 

## 2018-04-10 NOTE — Evaluation (Signed)
Occupational Therapy Evaluation Patient Details Name: [name] MRN: [id number] DOB: 03/08/1934 Today's Date: 04/10/2018    History of Present Illness [name] is a 85 y.o. female with medical history significant of vascular dementia, HTN, ostomy.  Patient presents to the ED with c/o "not feeling well" for past 2 days. Patients daughter told EDP on phone that patient had more difficulty walking over past 2 days with stumbling.  Incontinent of urine, increased confusion and sleepiness.  No documented fever but there are multiple sick contacts at home. Positive for Influenza B   Clinical Impression   PTA patient lives with family support, uses RW for mobility and has some assist with ADLs (per chart review/pt report, poor historian with hx of dementia).  Patient admitted for above, limited by decreased activity tolerance and generalized weakness.  Completes UB ADL with setup assist, LB ADLs with min assist, and transfers using RW with min guard assist.  Patient will benefit from continued OT services while admitted in order to optimize independence and safety with ADLs/mobility and return to PLOF, but anticipate no further needs after dc home.    Follow Up Recommendations  No OT follow up;Supervision/Assistance - 24 hour    Equipment Recommendations  None recommended by OT    Recommendations for Other Services       Precautions / Restrictions Precautions Precautions: Fall Restrictions Weight Bearing Restrictions: Yes      Mobility Bed Mobility Overal bed mobility: Needs Assistance Bed Mobility: Supine to Sit     Supine to sit: Supervision     General bed mobility comments: HOB elevated, increased time and effort but no physical assist required  Transfers Overall transfer level: Needs assistance Equipment used: Rolling walker (2 wheeled) Transfers: Sit to/from Stand Sit to Stand: Min guard         General transfer comment: cueing for hand placement and safety,  min guard for stability     Balance Overall balance assessment: Needs assistance Sitting-balance support: No upper extremity supported;Feet supported Sitting balance-Leahy Scale: Good     Standing balance support: Bilateral upper extremity supported;During functional activity Standing balance-Leahy Scale: Fair Standing balance comment: reliant on UE support                           ADL either performed or assessed with clinical judgement   ADL Overall ADL's : Needs assistance/impaired         Upper Body Bathing: Set up;Sitting   Lower Body Bathing: Minimal assistance;Sit to/from stand   Upper Body Dressing : Set up;Sitting   Lower Body Dressing: Minimal assistance;Sit to/from stand   Toilet Transfer: Min guard;Ambulation;RW Toilet Transfer Details (indicate cue type and reason): simulated to recliner          Functional mobility during ADLs: Min guard;Rolling walker General ADL Comments: limited by cognition and generalized weakness      Vision   Vision Assessment?: No apparent visual deficits     Perception     Praxis      Pertinent Vitals/Pain Pain Assessment: No/denies pain     Hand Dominance Right   Extremity/Trunk Assessment Upper Extremity Assessment Upper Extremity Assessment: Generalized weakness   Lower Extremity Assessment Lower Extremity Assessment: Defer to PT evaluation       Communication Communication Communication: No difficulties   Cognition Arousal/Alertness: Awake/alert Behavior During Therapy: WFL for tasks assessed/performed Overall Cognitive Status: History of cognitive impairments - at baseline  General Comments: no family present, oriented to self and place but not date (month not year); able to follow commands and participate appropriately; hx of dementia    General Comments  oxgyen maintained on 1.5 L of supplemental via [place]    Exercises     Shoulder  Instructions      Home Living Family/patient expects to be discharged to:: Private residence Living Arrangements: Other relatives;Children Available Help at Discharge: Family;Available 24 hours/day(mostly 24/7/day ) Type of Home: House Home Access: Stairs to enter [employer] of Steps: 4 Entrance Stairs-Rails: Right;Left Home Layout: One level     Bathroom Shower/Tub: Teacher, early years/pre: Standard     Home Equipment: Environmental consultant - 2 wheels;Shower seat   Additional Comments: pt is a poor historian, hx per pt report and chart review      Prior Functioning/Environment Level of Independence: Needs assistance  Gait / Transfers Assistance Needed: walks with walker ADL's / Homemaking Assistance Needed: supervision seated for bathing and dressing-pt sponge bathes, family does homemaking   Comments: patient reports granddaughter assists         OT Problem List: Decreased strength;Decreased activity tolerance;Impaired balance (sitting and/or standing);Decreased cognition;Decreased knowledge of use of DME or AE      OT Treatment/Interventions: Self-care/ADL training;Therapeutic exercise;DME and/or AE instruction;Patient/family education;Therapeutic activities;Balance training    OT Goals(Current goals can be found in the care plan section) Acute Rehab OT Goals Patient Stated Goal: to feel better OT Goal Formulation: With patient Time For Goal Achievement: 04/24/18 Potential to Achieve Goals: Good  OT Frequency: Min 2X/week   Barriers to D/C:            Co-evaluation              AM-PAC OT "6 Clicks" Daily Activity     Outcome Measure Help from another person eating meals?: None Help from another person taking care of personal grooming?: A Little Help from another person toileting, which includes using toliet, bedpan, or urinal?: A Little Help from another person bathing (including washing, rinsing, drying)?: A Little Help from another person to  put on and taking off regular upper body clothing?: None Help from another person to put on and taking off regular lower body clothing?: A Little 6 Click Score: 20   End of Session Equipment Utilized During Treatment: Rolling walker;Oxygen Nurse Communication: Mobility status  Activity Tolerance: Patient tolerated treatment well Patient left: in chair;with call bell/phone within reach;with chair alarm set  OT Visit Diagnosis: Muscle weakness (generalized) (M62.81)                Time: 1610-9604 OT Time Calculation (min): 20 min Charges:  OT General Charges $OT Visit: 1 Visit OT Evaluation $OT Eval Low Complexity: 1 Low  Delight Stare, OT Acute Rehabilitation Services Pager [phone] Office [phone]   Delight Stare 04/10/2018, 9:41 AM

## 2018-04-10 NOTE — Progress Notes (Signed)
PROGRESS NOTE  [name]  NKN:[id number] DOB: [birth year]  DOA: 04/09/2018 PCP: Leeroy Cha, MD   Brief Narrative:  [name] is a 85 y.o. female with medical history significant of vascular dementia, HTN, ostomy,  Presenting with to the ED with 2 day h/o  "not feeling well" assocaited with generalized weakness, unsteady with walking, incontinence of urine.She had low grade fever with unsteady, and tachycardic at ED, and Influenza B was positive.   Assessment & Plan:   Principal Problem:   Influenza B Active Problems:   Dementia ([place])   AKI (acute kidney injury) ([place])   Essential hypertension   Generalized weakness  1. Influenza B - 1. Tamiflu 2. Tylenol PRN fever 2. Delirium on top of vascular dementia - 1. Due to #1 above 2. Continue home vascular dementia meds once pharmacy connects with daughter to complete med rec 3. PT/OT 3. AKI - mild 1. Hold losartan 2. Gentle hydration with NS at 75 cc/hr 3. Repeat BMP tomorrow 4. Intake and output 4. HTN - 1. Hydralazine PRN 2. Holding losartan due to AKI 5. Ostomy - consult to wound, ostomy for management  DVT prophylaxis: Lovenox Code Status: Full Family Communication: No family in room Disposition Plan: Home after admit    Consultants:   none  Procedures:  noe  Antimicrobials:       Subjective: Sitting in chair, no acute events overnight Objective:  Vitals:   04/10/18 0500 04/10/18 0509 04/10/18 0613 04/10/18 0855  BP:   130/84   Pulse:   83   Resp:   18   Temp:   (!) 101.4 F (38.6 C) 98.9 F (37.2 C)  TempSrc:   Oral Oral  SpO2: 98% 98%    Weight:      Height:       No intake or output data in the 24 hours ending 04/10/18 1112 Filed Weights   04/10/18 0300  Weight: 68.8 kg    Examination:  General exam:NAD Respiratory system: Clear to auscultation. Respiratory effort normal. Cardiovascular system: S1 & S2 heard, RRR. No JVD, murmurs, rubs, gallops or clicks. No pedal  edema. Gastrointestinal system: Abdomen is nondistended, soft and nontender. No organomegaly or masses felt. Normal bowel sounds heard. Central nervous system: Alert and oriented. No focal neurological deficits. Extremities: Symmetric 5 x 5 power. Skin: No rashes, lesions or ulcers Psychiatry: Judgement and insight appear normal. Mood & affect appropriate.     Data Reviewed: I have personally reviewed following labs and imaging studies  CBC: Recent Labs  Lab 04/10/18 0034  WBC 5.1  HGB 12.0  HCT 38.7  MCV 96.8  PLT 379   Basic Metabolic Panel: Recent Labs  Lab 04/10/18 0034  NA 135  K 3.9  CL 103  CO2 23  GLUCOSE 136*  BUN 22  CREATININE 1.41*  CALCIUM 8.9   GFR: Estimated Creatinine Clearance: 28 mL/min (A) (by C-G formula based on SCr of 1.41 mg/dL (H)). Liver Function Tests: No results for input(s): AST, ALT, ALKPHOS, BILITOT, PROT, ALBUMIN in the last 168 hours. No results for input(s): LIPASE, AMYLASE in the last 168 hours. No results for input(s): AMMONIA in the last 168 hours. Coagulation Profile: No results for input(s): INR, PROTIME in the last 168 hours. Cardiac Enzymes: Recent Labs  Lab 04/10/18 0034  TROPONINI <0.03   BNP (last 3 results) No results for input(s): PROBNP in the last 8760 hours. HbA1C: No results for input(s): HGBA1C in the last 72 hours. CBG: No  results for input(s): GLUCAP in the last 168 hours. Lipid Profile: No results for input(s): CHOL, HDL, LDLCALC, TRIG, CHOLHDL, LDLDIRECT in the last 72 hours. Thyroid Function Tests: No results for input(s): TSH, T4TOTAL, FREET4, T3FREE, THYROIDAB in the last 72 hours. Anemia Panel: No results for input(s): VITAMINB12, FOLATE, FERRITIN, TIBC, IRON, RETICCTPCT in the last 72 hours.  Sepsis Labs: Recent Labs  Lab 04/10/18 0034  WBC 5.1    No results found for this or any previous visit (from the past 240 hour(s)).       Radiology Studies: Dg Chest 2 View  Result Date:  04/10/2018 CLINICAL DATA:  Cough. EXAM: CHEST - 2 VIEW COMPARISON:  03/28/2018 FINDINGS: The cardiomediastinal contours are unchanged with mild cardiomegaly. Pulmonary vasculature is normal. No consolidation, pleural effusion, or pneumothorax. No acute osseous abnormalities are seen. Chronic degenerative change of both shoulders. Degenerative change in the spine. IMPRESSION: No acute chest findings.  Chronic mild cardiomegaly. Electronically Signed   By: Keith Rake M.D.   On: 04/10/2018 00:16   Ct Head Wo Contrast  Result Date: 04/10/2018 CLINICAL DATA:  Encephalopathy. Generalized weakness. EXAM: CT HEAD WITHOUT CONTRAST TECHNIQUE: Contiguous axial images were obtained from the base of the skull through the vertex without intravenous contrast. COMPARISON:  Head CT 03/28/2018, brain MRI 11/22/2017, additional prior exams. FINDINGS: Brain: Unchanged degree of atrophy and chronic small vessel ischemia no intracranial hemorrhage, mass effect, or midline shift. No hydrocephalus. Crowding of the basilar cisterns with cerebellar ectopia is chronic. Chronic empty sella no evidence of territorial infarct or acute ischemia. No extra-axial or intracranial fluid collection. Vascular: Atherosclerosis of skullbase vasculature without hyperdense vessel or abnormal calcification. Skull: No fracture or focal lesion. Sinuses/Orbits: Chronic pan paranasal sinus mucosal thickening and prior sinus surgery. Stable chronic calcification in the region of the optic nerves. No acute findings. Other: None. IMPRESSION: 1. No acute intracranial abnormality. 2. Stable atrophy and chronic small vessel ischemia. Electronically Signed   By: Keith Rake M.D.   On: 04/10/2018 00:07        Scheduled Meds: . enoxaparin (LOVENOX) injection  30 mg Subcutaneous Q24H  . oseltamivir  30 mg Oral QHS   Continuous Infusions: . sodium chloride 75 mL/hr at 04/10/18 0427     LOS: 0 days    Time spent:32 mins    Benito Mccreedy, MD [facility] Pager [phone]  If 7PM-7AM, please contact night-coverage [email or web address] Password [email or web address] 04/10/2018, 11:12 AM

## 2018-04-10 NOTE — Evaluation (Signed)
Physical Therapy Evaluation Patient Details Name: [name] MRN: [id number] DOB: 1933-11-28 Today's Date: 04/10/2018   History of Present Illness  [name] is a 85 y.o. female with medical history significant of vascular dementia, HTN, ostomy.  Patient presents to the ED with c/o "not feeling well" for past 2 days. Patients daughter told EDP on phone that patient had more difficulty walking over past 2 days with stumbling.  Incontinent of urine, increased confusion and sleepiness.  No documented fever but there are multiple sick contacts at home. Positive for Influenza B  Clinical Impression   Pt admitted with above diagnosis. Pt currently with functional limitations due to the deficits listed below (see PT Problem List). Walks with RW and gets prn assist from family at baseline; Presents with generalized weakness, O2 sat drop with activity on Room Air;  Pt will benefit from skilled PT to increase their independence and safety with mobility to allow discharge to the venue listed below.       Follow Up Recommendations Home health PT    Equipment Recommendations  3in1 (PT)    Recommendations for Other Services       Precautions / Restrictions Precautions Precautions: Fall      Mobility  Bed Mobility                  Transfers Overall transfer level: Needs assistance Equipment used: Rolling walker (2 wheeled) Transfers: Sit to/from Stand Sit to Stand: Min guard         General transfer comment: cueing for hand placement and safety, min guard for stability   Ambulation/Gait Ambulation/Gait assistance: Min guard Gait Distance (Feet): 15 Feet Assistive device: Rolling walker (2 wheeled) Gait Pattern/deviations: Step-through pattern;Decreased step length - right;Decreased step length - left;Decreased stride length     General Gait Details: Cues to self-monitor for activity tolerance; good use of RW for stability  Stairs            Wheelchair  Mobility    Modified Rankin (Stroke Patients Only)       Balance     Sitting balance-Leahy Scale: Good       Standing balance-Leahy Scale: Fair                               Pertinent Vitals/Pain Pain Assessment: No/denies pain    Home Living Family/patient expects to be discharged to:: Private residence Living Arrangements: Other relatives;Children Available Help at Discharge: Family;Available 24 hours/day(mostly 24/7/day per chart review) Type of Home: House Home Access: Stairs to enter Entrance Stairs-Rails: Psychiatric nurse of Steps: 4 Home Layout: One level Home Equipment: Walker - 2 wheels;Shower seat Additional Comments: pt is a poor historian, hx per pt report and chart review    Prior Function Level of Independence: Needs assistance   Gait / Transfers Assistance Needed: walks with walker  ADL's / Homemaking Assistance Needed: supervision seated for bathing and dressing-pt sponge bathes, family does homemaking  Comments: patient reports granddaughter assists      Hand Dominance   Dominant Hand: Right    Extremity/Trunk Assessment   Upper Extremity Assessment Upper Extremity Assessment: Defer to OT evaluation    Lower Extremity Assessment Lower Extremity Assessment: Generalized weakness       Communication   Communication: No difficulties  Cognition Arousal/Alertness: Awake/alert Behavior During Therapy: WFL for tasks assessed/performed Overall Cognitive Status: History of cognitive impairments - at baseline  General Comments: no family present, oriented to self and place but not date (month not year); able to follow commands and participate appropriately; hx of dementia       General Comments General comments (skin integrity, edema, etc.): Session conducted on Room Air; noted O2 sats decr to as low as 92%; restarted supplemental O2 at end of session    Exercises      Assessment/Plan    PT Assessment Patient needs continued PT services  PT Problem List Decreased strength;Decreased activity tolerance;Decreased balance;Decreased mobility;Decreased cognition;Decreased knowledge of use of DME;Decreased safety awareness;Cardiopulmonary status limiting activity       PT Treatment Interventions DME instruction;Gait training;Stair training;Functional mobility training;Therapeutic activities;Therapeutic exercise;Balance training;Patient/family education;Cognitive remediation    PT Goals (Current goals can be found in the Care Plan section)  Acute Rehab PT Goals Patient Stated Goal: to feel better PT Goal Formulation: With patient Time For Goal Achievement: 04/24/18 Potential to Achieve Goals: Good    Frequency Min 3X/week   Barriers to discharge        Co-evaluation               AM-PAC PT "6 Clicks" Mobility  Outcome Measure Help needed turning from your back to your side while in a flat bed without using bedrails?: A Little Help needed moving from lying on your back to sitting on the side of a flat bed without using bedrails?: A Little Help needed moving to and from a bed to a chair (including a wheelchair)?: A Little Help needed standing up from a chair using your arms (e.g., wheelchair or bedside chair)?: A Little Help needed to walk in hospital room?: A Little Help needed climbing 3-5 steps with a railing? : A Little 6 Click Score: 18    End of Session Equipment Utilized During Treatment: Oxygen Activity Tolerance: Patient tolerated treatment well Patient left: in bed;with call bell/phone within reach;Other (comment)(sitting EOB; wanting to have ostomy emptied before laying do)   PT Visit Diagnosis: Muscle weakness (generalized) (M62.81)    Time: 2229-7989 PT Time Calculation (min) (ACUTE ONLY): 16 min   Charges:   PT Evaluation $PT Eval Low Complexity: [place], PT  Acute Rehabilitation  Services Pager [phone] Office [phone]   [place] 04/10/2018, 3:18 PM

## 2018-04-10 NOTE — Progress Notes (Signed)
Pt has a open area on her mid back no drainage or bleeding or swelling area on the inside has something that looks like dirt on the inside for a lack of a better description. Will report in handoff.

## 2018-04-10 NOTE — H&P (Signed)
History and Physical    [name] [id number]:[id number] DOB: May 01, 1933 DOA: 04/09/2018  PCP: Leeroy Cha, MD  Patient coming from: Home  I have personally briefly reviewed patient's old medical records in [place]  Chief Complaint: Generalized weakness  HPI: [name] is a 85 y.o. female with medical history significant of vascular dementia, HTN, ostomy.  Patient presents to the ED with c/o "not feeling well" for past 2 days.  EMS reports generalized weakness, unsteady with walking, incontinence of urine.  Patient denies any pain, is oriented to person and place.  Patients daughter told EDP on phone that patient had more difficulty walking over past 2 days with stumbling.  Incontinent of urine, increased confusion and sleepiness.  No documented fever but there are multiple sick contacts at home.   ED Course: Tm 99.5.  Creat 1.4.  BP initially 622W systolic, now has increased to 979G-921 systolic.  Influenza B positive.   Review of Systems: As per HPI otherwise 10 point review of systems negative.   Past Medical History:  Diagnosis Date  . Asthma   . Colostomy present ([place])    hx/notes 07/17/2010  . Dementia ([place])   . Diabetic neuropathy ([place])   . Hypertension    hx/notes 07/17/2010  . Pneumonia    recent/notes 10/08/2016  . Symptomatic bradycardia 12/15/2017  . Type II diabetes mellitus ([place])    hx/notes 07/17/2010    Past Surgical History:  Procedure Laterality Date  . ABDOMINAL HYSTERECTOMY    . COLOSTOMY     S/P SBO hx/notes 07/17/2010  . LUMBAR FUSION  11/2006   hx/notes 07/17/2010  . NASAL SINUS SURGERY  04/28/2002   Bilateral inferior turbinate reductions, bilateral maxillary antrotomies, bilateral total ethmoidectomies, bilateral frontal recess explorations, bilateral sphenoidotomies, [employer] guidance./notes 07/28/2010     reports that she has never smoked. She quit smokeless tobacco use about 13 months ago.  Her smokeless tobacco use included  chew. She reports that she does not drink alcohol or use drugs.  Allergies  Allergen Reactions  . Aspirin Other (See Comments)    Triggers asthma    Family History  Problem Relation Age of Onset  . Kidney disease Mother   . Hypertension Mother   . Other Father        gangrene with amputation  . Diabetes Sister   . Cancer Brother   . Heart disease Brother   . Schizophrenia Daughter   . Diabetes Daughter   . Stroke Daughter   . Diabetes Sister   . Breast cancer Neg Hx      Prior to Admission medications   Medication Sig Start Date End Date Taking? Authorizing Provider  acetaminophen (TYLENOL) 325 MG tablet Take 650 mg by mouth every 6 (six) hours as needed for headache (pain).    [provider]  albuterol (PROVENTIL HFA;VENTOLIN HFA) 108 (90 Base) MCG/ACT inhaler Inhale 1-2 puffs into the lungs every 4 (four) hours as needed for wheezing or shortness of breath.     [provider]  albuterol (PROVENTIL) (2.5 MG/3ML) 0.083% nebulizer solution Inhale 2.5 mg into the lungs as needed. 05/26/17   [provider]  alendronate (FOSAMAX) 70 MG tablet Take 70 mg by mouth every Monday.     [provider]  busPIRone (BUSPAR) 5 MG tablet Take 5 mg by mouth 3 (three) times daily. 11/24/17   [provider]  Calcium Citrate-Vitamin D (CALCIUM CITRATE + D) 315-250 MG-UNIT TABS Take 1 tablet by mouth  daily.    [provider]  donepezil (ARICEPT) 10 MG tablet Take 10 mg by mouth at bedtime.  02/24/15   [provider]  escitalopram (LEXAPRO) 10 MG tablet Take 10 mg by mouth at bedtime. 06/16/17   [provider]  FLOVENT HFA 220 MCG/ACT inhaler Inhale 1 puff into the lungs 2 (two) times daily. 05/28/17   [provider]  gabapentin (NEURONTIN) 300 MG capsule Take 300 mg by mouth 3 (three) times daily. 10/12/15   [provider]  Lactobacillus (PROBIOTIC ACIDOPHILUS PO) Take by mouth.    [provider]    losartan (COZAAR) 100 MG tablet Take 100 mg by mouth daily.    [provider]  memantine (NAMENDA XR) 28 MG CP24 24 hr capsule Take 28 mg by mouth daily.    [provider]  montelukast (SINGULAIR) 10 MG tablet Take 10 mg by mouth at bedtime.  06/21/17   [provider]  probenecid (BENEMID) 500 MG tablet Take 250 mg by mouth daily. 10/12/16   [provider]    Physical Exam: Vitals:   04/10/18 0115 04/10/18 0200 04/10/18 0230 04/10/18 0300  BP:  (!) 159/67 (!) 149/78   Pulse: (!) 58 61 65   Resp: 14 14 19    Temp:      TempSrc:      SpO2: 94% 98% 95%   Weight:    68.8 kg  Height:    5\' 2"  (1.575 m)    Constitutional: NAD, calm, comfortable Eyes: PERRL, lids and conjunctivae normal ENMT: Mucous membranes are moist. Posterior pharynx clear of any exudate or lesions.Normal dentition.  Neck: normal, supple, no masses, no thyromegaly Respiratory: clear to auscultation bilaterally, no wheezing, no crackles. Normal respiratory effort. No accessory muscle use.  Cardiovascular: Regular rate and rhythm, no murmurs / rubs / gallops. No extremity edema. 2+ pedal pulses. No carotid bruits.  Abdomen: NT, ostomy present Musculoskeletal: no clubbing / cyanosis. No joint deformity upper and lower extremities. Good ROM, no contractures. Normal muscle tone.  Skin: no rashes, lesions, ulcers. No induration Neurologic: 5/5 strength, unsteady gait, no focal weakness. Psychiatric: Oriented to person and place.  Labs on Admission: I have personally reviewed following labs and imaging studies  CBC: Recent Labs  Lab 04/10/18 0034  WBC 5.1  HGB 12.0  HCT 38.7  MCV 96.8  PLT 676   Basic Metabolic Panel: Recent Labs  Lab 04/10/18 0034  NA 135  K 3.9  CL 103  CO2 23  GLUCOSE 136*  BUN 22  CREATININE 1.41*  CALCIUM 8.9   GFR: Estimated Creatinine Clearance: 28 mL/min (A) (by C-G formula based on SCr of 1.41 mg/dL (H)). Liver Function Tests: No results  for input(s): AST, ALT, ALKPHOS, BILITOT, PROT, ALBUMIN in the last 168 hours. No results for input(s): LIPASE, AMYLASE in the last 168 hours. No results for input(s): AMMONIA in the last 168 hours. Coagulation Profile: No results for input(s): INR, PROTIME in the last 168 hours. Cardiac Enzymes: Recent Labs  Lab 04/10/18 0034  TROPONINI <0.03   BNP (last 3 results) No results for input(s): PROBNP in the last 8760 hours. HbA1C: No results for input(s): HGBA1C in the last 72 hours. CBG: No results for input(s): GLUCAP in the last 168 hours. Lipid Profile: No results for input(s): CHOL, HDL, LDLCALC, TRIG, CHOLHDL, LDLDIRECT in the last 72 hours. Thyroid Function Tests: No results for input(s): TSH, T4TOTAL, FREET4, T3FREE, THYROIDAB in the last 72 hours. Anemia Panel:  No results for input(s): VITAMINB12, FOLATE, FERRITIN, TIBC, IRON, RETICCTPCT in the last 72 hours. Urine analysis:    Component Value Date/Time   COLORURINE YELLOW 04/10/2018 0228   APPEARANCEUR CLEAR 04/10/2018 0228   LABSPEC 1.016 04/10/2018 0228   PHURINE 5.0 04/10/2018 0228   GLUCOSEU NEGATIVE 04/10/2018 0228   HGBUR SMALL (A) 04/10/2018 0228   BILIRUBINUR NEGATIVE 04/10/2018 0228   KETONESUR NEGATIVE 04/10/2018 0228   PROTEINUR NEGATIVE 04/10/2018 0228   UROBILINOGEN 1.0 01/09/2015 1620   NITRITE NEGATIVE 04/10/2018 0228   LEUKOCYTESUR NEGATIVE 04/10/2018 0228    Radiological Exams on Admission: Dg Chest 2 View  Result Date: 04/10/2018 CLINICAL DATA:  Cough. EXAM: CHEST - 2 VIEW COMPARISON:  03/28/2018 FINDINGS: The cardiomediastinal contours are unchanged with mild cardiomegaly. Pulmonary vasculature is normal. No consolidation, pleural effusion, or pneumothorax. No acute osseous abnormalities are seen. Chronic degenerative change of both shoulders. Degenerative change in the spine. IMPRESSION: No acute chest findings.  Chronic mild cardiomegaly. Electronically Signed   By: Keith Rake M.D.   On:  04/10/2018 00:16   Ct Head Wo Contrast  Result Date: 04/10/2018 CLINICAL DATA:  Encephalopathy. Generalized weakness. EXAM: CT HEAD WITHOUT CONTRAST TECHNIQUE: Contiguous axial images were obtained from the base of the skull through the vertex without intravenous contrast. COMPARISON:  Head CT 03/28/2018, brain MRI 11/22/2017, additional prior exams. FINDINGS: Brain: Unchanged degree of atrophy and chronic small vessel ischemia no intracranial hemorrhage, mass effect, or midline shift. No hydrocephalus. Crowding of the basilar cisterns with cerebellar ectopia is chronic. Chronic empty sella no evidence of territorial infarct or acute ischemia. No extra-axial or intracranial fluid collection. Vascular: Atherosclerosis of skullbase vasculature without hyperdense vessel or abnormal calcification. Skull: No fracture or focal lesion. Sinuses/Orbits: Chronic pan paranasal sinus mucosal thickening and prior sinus surgery. Stable chronic calcification in the region of the optic nerves. No acute findings. Other: None. IMPRESSION: 1. No acute intracranial abnormality. 2. Stable atrophy and chronic small vessel ischemia. Electronically Signed   By: Keith Rake M.D.   On: 04/10/2018 00:07    EKG: Independently reviewed.  Assessment/Plan Principal Problem:   Influenza B Active Problems:   Dementia (HCC)   AKI (acute kidney injury) ([place])   Essential hypertension   Generalized weakness    1. Influenza B - 1. Tamiflu 2. Tylenol PRN fever 2. Delirium on top of vascular dementia - 1. Due to #1 above 2. Continue home vascular dementia meds once pharmacy connects with daughter to complete med rec 3. PT/OT 3. AKI - mild 1. Hold losartan 2. Gentle hydration with NS at 75 cc/hr 3. Repeat BMP tomorrow 4. Intake and output 4. HTN - 1. Hydralazine PRN 2. Holding losartan due to AKI 5. Ostomy - consult to wound, ostomy for management  DVT prophylaxis: Lovenox Code Status: Full Family Communication:  No family in room Disposition Plan: Home after admit Consults called: None Admission status: Place in obs    [place], [place] [facility] Pager [phone] Only works nights!  If 7AM-7PM, please contact the primary day team physician taking care of patient  [email or web address] Password [facility]  04/10/2018, 3:36 AM

## 2018-04-11 DIAGNOSIS — J101 Influenza due to other identified influenza virus with other respiratory manifestations: Secondary | ICD-10-CM | POA: Diagnosis not present

## 2018-04-11 LAB — CBC
HCT: 35.9 % — ABNORMAL LOW (ref 36.0–46.0)
Hemoglobin: 11.4 g/dL — ABNORMAL LOW (ref 12.0–15.0)
MCH: 30.3 pg (ref 26.0–34.0)
MCHC: 31.8 g/dL (ref 30.0–36.0)
MCV: 95.5 fL (ref 80.0–100.0)
NRBC: 0 % (ref 0.0–0.2)
Platelets: 153 10*3/uL (ref 150–400)
RBC: 3.76 MIL/uL — ABNORMAL LOW (ref 3.87–5.11)
RDW: 12.8 % (ref 11.5–15.5)
WBC: 3.7 10*3/uL — ABNORMAL LOW (ref 4.0–10.5)

## 2018-04-11 LAB — COMPREHENSIVE METABOLIC PANEL
ALT: 9 U/L (ref 0–44)
AST: 17 U/L (ref 15–41)
Albumin: 2.8 g/dL — ABNORMAL LOW (ref 3.5–5.0)
Alkaline Phosphatase: 52 U/L (ref 38–126)
Anion gap: 11 (ref 5–15)
BILIRUBIN TOTAL: 0.5 mg/dL (ref 0.3–1.2)
BUN: 20 mg/dL (ref 8–23)
CO2: 19 mmol/L — ABNORMAL LOW (ref 22–32)
Calcium: 8.3 mg/dL — ABNORMAL LOW (ref 8.9–10.3)
Chloride: 106 mmol/L (ref 98–111)
Creatinine, Ser: 1.05 mg/dL — ABNORMAL HIGH (ref 0.44–1.00)
GFR calc Af Amer: 57 mL/min — ABNORMAL LOW (ref >60)
GFR, EST NON AFRICAN AMERICAN: 49 mL/min — AB (ref >60)
Glucose, Bld: 139 mg/dL — ABNORMAL HIGH (ref 70–99)
Potassium: 4.3 mmol/L (ref 3.5–5.1)
Sodium: 136 mmol/L (ref 135–145)
TOTAL PROTEIN: 6.1 g/dL — AB (ref 6.5–8.1)

## 2018-04-11 MED ORDER — LOSARTAN POTASSIUM 100 MG PO TABS: 50.0000 mg | ORAL_TABLET | Freq: Every day | ORAL | Status: DC

## 2018-04-11 MED ORDER — OSELTAMIVIR PHOSPHATE 30 MG PO CAPS: 30.0000 mg | ORAL_CAPSULE | Freq: Every day | ORAL | 0 refills | Status: DC

## 2018-04-11 NOTE — Discharge Summary (Addendum)
Physician Discharge Summary  [name] DQQ:[id number] DOB: [birth year] DOA: 04/09/2018  PCP: Leeroy Cha, MD  Admit date: 04/09/2018 Discharge date: 04/11/2018  Time spent: 35 minutes  Recommendations for Outpatient Follow-up:  1. Recommend outpatient CBC and complete metabolic panel 2. 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 [place] with previous SBO Admitted last to the hospital 5/5-5/619 with conversion disorder  Admit to the hospital 1/20 6 AM with generalized weakness and found to have influenza B also found to have AKI on admission and ARB was held      A & Plan Influenza type B with bronchitis  Patient was admitted transiently with shortness of breath as well as cough and was given IV steroids however on exam on discharge she was found to have no wheeze and was sitting up in the chair eating breakfast off of oxygen she was pretty oriented I do not recommend adding prednisone to her meds she can complete 4 more days of Tamiflu as an outpatient AKI-she had a bump in her creatinine to 1.4 and her losartan was discontinued I have cautiously reintroduced 50 mg I do suspect that this should be discontinued completely and she should do something like amlodipine as an outpatient depending on labs in the outpatient setting Type 2 diabetes mellitus-well-controlled this hospital stay  The following  problems were stable during hospitalization Lumbar stenosis Symptomatic bradycardia Status post ostomy around 2000 for perforated colon Lumbar stenosis status post decompression and complications Dementia with behavioral disturbances  Discharge Exam: Vitals:   04/11/18 0452 04/11/18 0559  BP: (!) 160/72   Pulse: (!) 56   Resp: 16   Temp: 98.8 F (37.1 C)   SpO2: 99% 100%    General: EOMI NCAT no distress oriented cannot tell me where she is can tell me her home and is able to verbalize in full sentences not using oxygen Cardiovascular: S1-S2 no murmur Respiratory: Clinically clear no added sound no rales no rhonchi Abdomen soft nontender no rebound Neurologically intact  Discharge Instructions   Discharge Instructions    Diet - low sodium heart healthy   Complete by:  As directed    Discharge instructions   Complete by:  As directed    Your diagnosis hospital stay with the flu-you do not need to do anything other than complete Tamiflu I would recommend you cut back your dose of losartan the medication for blood pressure and discuss this with your primary physician given your repeated episodes of dehydration in the past with this Recommend drinking regular amounts of water about 2 L for the next 2 or 3 days to keep yourself hydrated and then cut back to about 1.5 L get labs in a week at your primary care physician   Increase activity slowly   Complete by:  As directed      Allergies as of 04/11/2018      Reactions   Aspirin Other (See Comments)   Triggers asthma      Medication  List    STOP taking these medications   PROBIOTIC ACIDOPHILUS PO     TAKE these medications   acetaminophen 325 MG tablet Commonly known as:  TYLENOL Take 650 mg by mouth every 6 (six) hours as needed for headache (pain).   albuterol 108 (90 Base) MCG/ACT inhaler Commonly known as:  PROVENTIL HFA;VENTOLIN HFA Inhale 1-2 puffs into the lungs every 4 (four) hours as needed for wheezing or  shortness of breath.   albuterol (2.5 MG/3ML) 0.083% nebulizer solution Commonly known as:  PROVENTIL Inhale 2.5 mg into the lungs as needed.   alendronate 70 MG tablet Commonly known as:  FOSAMAX Take 70 mg by mouth every Monday.   busPIRone 5 MG tablet Commonly known as:  BUSPAR Take 5 mg by mouth 3 (three) times daily.   CALCIUM CITRATE + D 315-250 MG-UNIT Tabs Generic drug:  Calcium Citrate-Vitamin D Take 1 tablet by mouth daily.   donepezil 10 MG tablet Commonly known as:  ARICEPT Take 10 mg by mouth at bedtime.   escitalopram 10 MG tablet Commonly known as:  LEXAPRO Take 10 mg by mouth at bedtime.   FLOVENT HFA 220 MCG/ACT inhaler Generic drug:  fluticasone Inhale 1 puff into the lungs 2 (two) times daily.   gabapentin 300 MG capsule Commonly known as:  NEURONTIN Take 300 mg by mouth 3 (three) times daily.   losartan 100 MG tablet Commonly known as:  COZAAR Take 0.5 tablets (50 mg total) by mouth daily. What changed:  how much to take   montelukast 10 MG tablet Commonly known as:  SINGULAIR Take 10 mg by mouth at bedtime.   NAMENDA XR 28 MG Cp24 24 hr capsule Generic drug:  memantine Take 28 mg by mouth daily.   oseltamivir 30 MG capsule Commonly known as:  TAMIFLU Take 1 capsule (30 mg total) by mouth at bedtime.   probenecid 500 MG tablet Commonly known as:  BENEMID Take 250 mg by mouth daily.      Allergies  Allergen Reactions  . Aspirin Other (See Comments)    Triggers asthma      The results of significant diagnostics from this hospitalization (including imaging, microbiology, ancillary and laboratory) are listed below for reference.    Significant Diagnostic Studies: Dg Chest 2 View  Result Date: 04/10/2018 CLINICAL DATA:  Cough. EXAM: CHEST - 2 VIEW COMPARISON:  03/28/2018 FINDINGS: The cardiomediastinal contours are unchanged with mild cardiomegaly. Pulmonary vasculature is normal. No consolidation, pleural effusion, or pneumothorax.  No acute osseous abnormalities are seen. Chronic degenerative change of both shoulders. Degenerative change in the spine. IMPRESSION: No acute chest findings.  Chronic mild cardiomegaly. Electronically Signed   By: Keith Rake M.D.   On: 04/10/2018 00:16   Dg Chest 2 View  Result Date: 03/28/2018 CLINICAL DATA:  Lethargy EXAM: CHEST - 2 VIEW COMPARISON:  December 13, 2017 FINDINGS: The lungs are clear. Heart is mildly enlarged with pulmonary vascularity normal. No adenopathy. There is degenerative change in the thoracic spine. There are scattered foci of coronary artery calcification. IMPRESSION: Mild cardiomegaly. No edema or consolidation. There are scattered foci of coronary artery calcification. Electronically Signed   By: Lowella Grip III M.D.   On: 03/28/2018 18:19   Ct Head Wo Contrast  Result Date: 04/10/2018 CLINICAL DATA:  Encephalopathy. Generalized weakness. EXAM: CT HEAD WITHOUT CONTRAST TECHNIQUE: Contiguous axial images were obtained from the base of the skull through the vertex without intravenous contrast. COMPARISON:  Head CT 03/28/2018, brain MRI  11/22/2017, additional prior exams. FINDINGS: Brain: Unchanged degree of atrophy and chronic small vessel ischemia no intracranial hemorrhage, mass effect, or midline shift. No hydrocephalus. Crowding of the basilar cisterns with cerebellar ectopia is chronic. Chronic empty sella no evidence of territorial infarct or acute ischemia. No extra-axial or intracranial fluid collection. Vascular: Atherosclerosis of skullbase vasculature without hyperdense vessel or abnormal calcification. Skull: No fracture or focal lesion. Sinuses/Orbits: Chronic pan paranasal sinus mucosal thickening and prior sinus surgery. Stable chronic calcification in the region of the optic nerves. No acute findings. Other: None. IMPRESSION: 1. No acute intracranial abnormality. 2. Stable atrophy and chronic small vessel ischemia. Electronically Signed   By: Keith Rake M.D.   On: 04/10/2018 00:07   Ct Head Wo Contrast  Result Date: 03/28/2018 CLINICAL DATA:  Generalized weakness and altered mental status. EXAM: CT HEAD WITHOUT CONTRAST TECHNIQUE: Contiguous axial images were obtained from the base of the skull through the vertex without intravenous contrast. COMPARISON:  Head CT 07/08/2017 FINDINGS: Brain: There is no mass, hemorrhage or extra-axial collection. The size and configuration of the ventricles and extra-axial CSF spaces are normal. The brain parenchyma is normal, without evidence of acute or chronic infarction. Vascular: No abnormal hyperdensity of the major intracranial arteries or dural venous sinuses. No intracranial atherosclerosis. Skull: The visualized skull base, calvarium and extracranial soft tissues are normal. Sinuses/Orbits: There is diffuse severe mucosal thickening of the paranasal sinuses, little changed compared to 07/08/2017. The orbits are normal. IMPRESSION: 1. No acute intracranial abnormality. 2. Severe chronic sinusitis. Electronically Signed   By: Ulyses Jarred M.D.   On: 03/28/2018 17:32    Microbiology: No results found for this or any previous visit (from the past 240 hour(s)).   Labs: Basic Metabolic Panel: Recent Labs  Lab 04/10/18 0034 04/11/18 0355  NA 135 136  K 3.9 4.3  CL 103 106  CO2 23 19*  GLUCOSE 136* 139*  BUN 22 20  CREATININE 1.41* 1.05*  CALCIUM 8.9 8.3*   Liver Function Tests: Recent Labs  Lab 04/11/18 0355  AST 17  ALT 9  ALKPHOS 52  BILITOT 0.5  PROT 6.1*  ALBUMIN 2.8*   No results for input(s): LIPASE, AMYLASE in the last 168 hours. No results for input(s): AMMONIA in the last 168 hours. CBC: Recent Labs  Lab 04/10/18 0034 04/11/18 0355  WBC 5.1 3.7*  HGB 12.0 11.4*  HCT 38.7 35.9*  MCV 96.8 95.5  PLT 163 153   Cardiac Enzymes: Recent Labs  Lab 04/10/18 0034  TROPONINI <0.03   BNP: BNP (last 3 results) Recent Labs    04/10/18 0034  BNP 36.1    ProBNP  (last 3 results) No results for input(s): PROBNP in the last 8760 hours.  CBG: No results for input(s): GLUCAP in the last 168 hours.     Signed:  Nita Sells MD   [facility] 04/11/2018, 8:14 AM

## 2018-04-11 NOTE — Care Management Note (Signed)
Case Management Note  Patient Details  Name: [name] MRN: [id number] Date of Birth: [birth year]  Subjective/Objective:  85 yr old female admitted with Influenza B.   Action/Plan: Case manager spoke with patient's daughter concerning discharge plan. Choice for [place] was offered, referral was called to Neoma Laming, [place] [facility] Liaison. Patient's daughter has no transportation and doesn't have access to transportation, requests that mom be discharged home via [place]. CM has called and scheduled transportation.    Expected Discharge Date:  04/11/18               Expected Discharge Plan:  [place]  In-House Referral:  NA  Discharge planning Services  CM Consult  Post Acute Care Choice:  Home Health Choice offered to:  Adult Children  DME Arranged:  N/A DME Agency:  NA  HH Arranged:  PT HH Agency:  [place]  Status of Service:  Completed, signed off  If discussed at [place] of Stay Meetings, dates discussed:    Additional Comments:  Ninfa Meeker, RN 04/11/2018, 1:17 PM

## 2018-04-11 NOTE — Progress Notes (Signed)
Pt had an episode this morning when she woke up thinking she was at home and had to be reoriented. Bed alarm is on and call light is next to pt. Will continue to monitor pt.

## 2018-04-11 NOTE — Progress Notes (Signed)
On examination pt has a lot of congestion and expiratory wheezing bilaterally. Respiratory called for breathing treatment. Pt does not present with any signs of distress. Will continue to monitor pt.

## 2018-04-11 NOTE — Progress Notes (Signed)
RN called for pt to have breathing tx due to wheezing.  RT arrived and found no wheeze (BS are clear/ in upper lobes and slightly diminished in bases)  and pt in no distress, SOB or tightness.  RT administered tx but feels tx was unnecessary.  RT will continue to monitor.

## 2018-04-11 NOTE — Consult Note (Signed)
WOC reviewed chart. Patient with ostomy from 2004, no needs plans for DC today.  [place], [place], [place]

## 2018-04-11 NOTE — Care Management Obs Status (Signed)
[place] NOTIFICATION   Patient Details  Name: [name] MRN: [id number] Date of Birth: 12-26-1933   Medicare Observation Status Notification Given:  Yes    Erenest Rasher, RN 04/11/2018, 11:24 AM

## 2018-04-11 NOTE — Plan of Care (Signed)
  Problem: Health Behavior/Discharge Planning: Goal: Ability to manage health-related needs will improve Outcome: Progressing   Problem: Clinical Measurements: Goal: Will remain free from infection Outcome: Progressing Goal: Respiratory complications will improve Outcome: Progressing Goal: Cardiovascular complication will be avoided Outcome: Progressing   Problem: Nutrition: Goal: Adequate nutrition will be maintained Outcome: Progressing   Problem: Coping: Goal: Level of anxiety will decrease Outcome: Progressing

## 2018-04-25 ENCOUNTER — Ambulatory Visit: Payer: Self-pay | Admitting: Pulmonary Disease

## 2018-04-29 ENCOUNTER — Telehealth: Payer: Self-pay | Admitting: Internal Medicine

## 2018-04-29 NOTE — Telephone Encounter (Signed)
Pt's primary card is Dr. Oval Linsey. Pt was seen by Kerin Ransom, PA 03/01/18. I am going to route this message for primary card. I will send to Dr. Olin Pia nurse as Juluis Rainier as well.

## 2018-04-29 NOTE — Telephone Encounter (Signed)
New Message   PTs granddaughter [name] is calling to see if her grandmother needs an appt with Dr Caryl Comes  Her Granddaughter says the PT is having some swelling in her ankles, her BP is good. The PT saw her PC today and the PC told her the swelling could be due to the heart The PC did change her medications as well.   Please call PT before 5 pm

## 2018-04-29 NOTE — Telephone Encounter (Signed)
Returned the call to the patient's granddaughter Tameka. The pt was seen by her primary care physician today. It was noted that she does have some swelling in her left ankle, no other swelling noted, the leg is not warm to touch. Pt is not having any sob, no reoccurrence of syncope. Brandy Hale to have the pt elevate her legs as much as possible.  Tameka sts that the pt BP at the PCPs office today was good her HR remains in the 50's. Beau Fanny is unable to provide the reading. Tameka did clarify that pcp did not say that the pt left ankle swelling was coming from her heart. Beau Fanny is wanting to reschedule the missed appt with Dr.Klein that the pt had in Jan 2020 to follow up on the pt Bradycardia. The only med change noted was reducing Losartan from 100mg  to 50mg  daily which is already reflected in the pt chart.  Brandy Hale that I will fwd the message to the Dr.Klein's scheduler to contact her to schedule the f/u appt with him. Brandy Hale to contact the office sooner if the pt develops cardiac symptoms. Tameka verbalizes understanding.

## 2018-05-01 ENCOUNTER — Inpatient Hospital Stay (HOSPITAL_COMMUNITY)
Admission: EM | Admit: 2018-05-01 | Discharge: 2018-05-03 | DRG: 243 | Disposition: A | Discharge status: Home / Self Care | Payer: Medicare Other | Attending: Internal Medicine | Admitting: Internal Medicine

## 2018-05-01 ENCOUNTER — Other Ambulatory Visit: Payer: Self-pay

## 2018-05-01 ENCOUNTER — Encounter (HOSPITAL_COMMUNITY): Payer: Self-pay | Admitting: *Deleted

## 2018-05-01 ENCOUNTER — Emergency Department (HOSPITAL_COMMUNITY): Payer: Medicare Other

## 2018-05-01 DIAGNOSIS — N183 Chronic kidney disease, stage 3 unspecified: Secondary | ICD-10-CM

## 2018-05-01 DIAGNOSIS — R001 Bradycardia, unspecified: Secondary | ICD-10-CM | POA: Diagnosis present

## 2018-05-01 DIAGNOSIS — I440 Atrioventricular block, first degree: Principal | ICD-10-CM | POA: Diagnosis present

## 2018-05-01 DIAGNOSIS — Z950 Presence of cardiac pacemaker: Secondary | ICD-10-CM

## 2018-05-01 DIAGNOSIS — Z7983 Long term (current) use of bisphosphonates: Secondary | ICD-10-CM

## 2018-05-01 DIAGNOSIS — Z933 Colostomy status: Secondary | ICD-10-CM

## 2018-05-01 DIAGNOSIS — I272 Pulmonary hypertension, unspecified: Secondary | ICD-10-CM | POA: Diagnosis present

## 2018-05-01 DIAGNOSIS — I444 Left anterior fascicular block: Secondary | ICD-10-CM | POA: Diagnosis present

## 2018-05-01 DIAGNOSIS — J4489 Other specified chronic obstructive pulmonary disease: Secondary | ICD-10-CM | POA: Diagnosis present

## 2018-05-01 DIAGNOSIS — E872 Acidosis: Secondary | ICD-10-CM | POA: Diagnosis present

## 2018-05-01 DIAGNOSIS — I10 Essential (primary) hypertension: Secondary | ICD-10-CM | POA: Diagnosis present

## 2018-05-01 DIAGNOSIS — E118 Type 2 diabetes mellitus with unspecified complications: Secondary | ICD-10-CM | POA: Diagnosis present

## 2018-05-01 DIAGNOSIS — R55 Syncope and collapse: Secondary | ICD-10-CM | POA: Diagnosis present

## 2018-05-01 DIAGNOSIS — E1122 Type 2 diabetes mellitus with diabetic chronic kidney disease: Secondary | ICD-10-CM | POA: Diagnosis present

## 2018-05-01 DIAGNOSIS — Z833 Family history of diabetes mellitus: Secondary | ICD-10-CM

## 2018-05-01 DIAGNOSIS — Z886 Allergy status to analgesic agent status: Secondary | ICD-10-CM

## 2018-05-01 DIAGNOSIS — E1140 Type 2 diabetes mellitus with diabetic neuropathy, unspecified: Secondary | ICD-10-CM | POA: Diagnosis present

## 2018-05-01 DIAGNOSIS — I129 Hypertensive chronic kidney disease with stage 1 through stage 4 chronic kidney disease, or unspecified chronic kidney disease: Secondary | ICD-10-CM | POA: Diagnosis present

## 2018-05-01 DIAGNOSIS — Z8249 Family history of ischemic heart disease and other diseases of the circulatory system: Secondary | ICD-10-CM

## 2018-05-01 DIAGNOSIS — Z841 Family history of disorders of kidney and ureter: Secondary | ICD-10-CM

## 2018-05-01 DIAGNOSIS — E119 Type 2 diabetes mellitus without complications: Secondary | ICD-10-CM | POA: Diagnosis present

## 2018-05-01 DIAGNOSIS — J449 Chronic obstructive pulmonary disease, unspecified: Secondary | ICD-10-CM | POA: Diagnosis present

## 2018-05-01 DIAGNOSIS — D631 Anemia in chronic kidney disease: Secondary | ICD-10-CM | POA: Diagnosis present

## 2018-05-01 DIAGNOSIS — N1832 Chronic kidney disease, stage 3b: Secondary | ICD-10-CM | POA: Diagnosis present

## 2018-05-01 DIAGNOSIS — Z79899 Other long term (current) drug therapy: Secondary | ICD-10-CM

## 2018-05-01 DIAGNOSIS — F0390 Unspecified dementia without behavioral disturbance: Secondary | ICD-10-CM | POA: Diagnosis present

## 2018-05-01 HISTORY — DX: Chronic kidney disease, stage 3 (moderate): N18.3

## 2018-05-01 HISTORY — DX: Chronic kidney disease, stage 3 unspecified: N18.30

## 2018-05-01 LAB — I-STAT TROPONIN, ED
Troponin i, poc: 0 ng/mL (ref 0.00–0.08)
Troponin i, poc: 0 ng/mL (ref 0.00–0.08)

## 2018-05-01 LAB — CBC WITH DIFFERENTIAL/PLATELET
Abs Immature Granulocytes: 0.05 10*3/uL (ref 0.00–0.07)
Basophils Absolute: 0 10*3/uL (ref 0.0–0.1)
Basophils Relative: 0 %
Eosinophils Absolute: 0.1 10*3/uL (ref 0.0–0.5)
Eosinophils Relative: 2 %
HEMATOCRIT: 38.4 % (ref 36.0–46.0)
HEMOGLOBIN: 11.4 g/dL — AB (ref 12.0–15.0)
Immature Granulocytes: 1 %
Lymphocytes Relative: 23 %
Lymphs Abs: 1.5 10*3/uL (ref 0.7–4.0)
MCH: 29.2 pg (ref 26.0–34.0)
MCHC: 29.7 g/dL — ABNORMAL LOW (ref 30.0–36.0)
MCV: 98.5 fL (ref 80.0–100.0)
MONOS PCT: 8 %
Monocytes Absolute: 0.5 10*3/uL (ref 0.1–1.0)
Neutro Abs: 4.1 10*3/uL (ref 1.7–7.7)
Neutrophils Relative %: 66 %
Platelets: 173 10*3/uL (ref 150–400)
RBC: 3.9 MIL/uL (ref 3.87–5.11)
RDW: 12.7 % (ref 11.5–15.5)
WBC: 6.3 10*3/uL (ref 4.0–10.5)
nRBC: 0 % (ref 0.0–0.2)

## 2018-05-01 LAB — GLUCOSE, CAPILLARY
Glucose-Capillary: 132 mg/dL — ABNORMAL HIGH (ref 70–99)
Glucose-Capillary: 195 mg/dL — ABNORMAL HIGH (ref 70–99)

## 2018-05-01 LAB — COMPREHENSIVE METABOLIC PANEL
ALK PHOS: 68 U/L (ref 38–126)
ALT: 12 U/L (ref 0–44)
ANION GAP: 12 (ref 5–15)
AST: 20 U/L (ref 15–41)
Albumin: 2.7 g/dL — ABNORMAL LOW (ref 3.5–5.0)
BILIRUBIN TOTAL: 0.6 mg/dL (ref 0.3–1.2)
BUN: 8 mg/dL (ref 8–23)
CO2: 21 mmol/L — ABNORMAL LOW (ref 22–32)
Calcium: 8.9 mg/dL (ref 8.9–10.3)
Chloride: 105 mmol/L (ref 98–111)
Creatinine, Ser: 1.04 mg/dL — ABNORMAL HIGH (ref 0.44–1.00)
GFR calc Af Amer: 58 mL/min — ABNORMAL LOW (ref >60)
GFR, EST NON AFRICAN AMERICAN: 50 mL/min — AB (ref >60)
Glucose, Bld: 265 mg/dL — ABNORMAL HIGH (ref 70–99)
Potassium: 3.5 mmol/L (ref 3.5–5.1)
Sodium: 138 mmol/L (ref 135–145)
Total Protein: 6.2 g/dL — ABNORMAL LOW (ref 6.5–8.1)

## 2018-05-01 LAB — URINALYSIS, ROUTINE W REFLEX MICROSCOPIC
Bacteria, UA: NONE SEEN
Bilirubin Urine: NEGATIVE
Glucose, UA: NEGATIVE mg/dL
Ketones, ur: NEGATIVE mg/dL
Leukocytes,Ua: NEGATIVE
Nitrite: NEGATIVE
Protein, ur: NEGATIVE mg/dL
Specific Gravity, Urine: 1.014 (ref 1.005–1.030)
pH: 5 (ref 5.0–8.0)

## 2018-05-01 LAB — LACTIC ACID, PLASMA
LACTIC ACID, VENOUS: 1.8 mmol/L (ref 0.5–1.9)
Lactic Acid, Venous: 4 mmol/L (ref 0.5–1.9)

## 2018-05-01 LAB — LIPASE, BLOOD: Lipase: 37 U/L (ref 11–51)

## 2018-05-01 LAB — MAGNESIUM: Magnesium: 1.4 mg/dL — ABNORMAL LOW (ref 1.7–2.4)

## 2018-05-01 LAB — TSH: TSH: 1.735 u[IU]/mL (ref 0.350–4.500)

## 2018-05-01 MED ORDER — SODIUM CHLORIDE 0.9 % IV BOLUS
1000.0000 mL | Freq: Once | INTRAVENOUS | Status: AC
  Administered 2018-05-01: 1000 mL via INTRAVENOUS

## 2018-05-01 MED ORDER — ESCITALOPRAM OXALATE 10 MG PO TABS
10.0000 mg | ORAL_TABLET | Freq: Every day | ORAL | Status: DC
  Administered 2018-05-01 – 2018-05-02 (×2): 10 mg via ORAL
  Filled 2018-05-01 (×2): qty 1

## 2018-05-01 MED ORDER — BUDESONIDE 0.5 MG/2ML IN SUSP
0.5000 mg | Freq: Two times a day (BID) | RESPIRATORY_TRACT | Status: DC
  Administered 2018-05-01 – 2018-05-03 (×4): 0.5 mg via RESPIRATORY_TRACT
  Filled 2018-05-01 (×4): qty 2

## 2018-05-01 MED ORDER — DONEPEZIL HCL 10 MG PO TABS
10.0000 mg | ORAL_TABLET | Freq: Every day | ORAL | Status: DC
  Administered 2018-05-01 – 2018-05-02 (×2): 10 mg via ORAL
  Filled 2018-05-01 (×2): qty 1

## 2018-05-01 MED ORDER — INSULIN ASPART 100 UNIT/ML ~~LOC~~ SOLN
0.0000 [IU] | Freq: Three times a day (TID) | SUBCUTANEOUS | Status: DC
  Administered 2018-05-01 – 2018-05-03 (×4): 1 [IU] via SUBCUTANEOUS

## 2018-05-01 MED ORDER — LACTATED RINGERS IV SOLN
INTRAVENOUS | Status: DC
  Administered 2018-05-01 – 2018-05-02 (×2): via INTRAVENOUS

## 2018-05-01 MED ORDER — BUSPIRONE HCL 5 MG PO TABS
5.0000 mg | ORAL_TABLET | Freq: Three times a day (TID) | ORAL | Status: DC
  Administered 2018-05-01 – 2018-05-03 (×5): 5 mg via ORAL
  Filled 2018-05-01 (×5): qty 1

## 2018-05-01 MED ORDER — ACETAMINOPHEN 325 MG PO TABS: 650.0000 mg | ORAL_TABLET | Freq: Four times a day (QID) | ORAL | Status: DC | PRN

## 2018-05-01 MED ORDER — DOCUSATE SODIUM 100 MG PO CAPS
100.0000 mg | ORAL_CAPSULE | Freq: Two times a day (BID) | ORAL | Status: DC
  Administered 2018-05-01 – 2018-05-03 (×4): 100 mg via ORAL
  Filled 2018-05-01 (×4): qty 1

## 2018-05-01 MED ORDER — ACETAMINOPHEN 650 MG RE SUPP: 650.0000 mg | Freq: Four times a day (QID) | RECTAL | Status: DC | PRN

## 2018-05-01 MED ORDER — ONDANSETRON HCL 4 MG/2ML IJ SOLN
4.0000 mg | Freq: Four times a day (QID) | INTRAMUSCULAR | Status: DC | PRN
  Administered 2018-05-02: 4 mg via INTRAVENOUS
  Filled 2018-05-01: qty 2

## 2018-05-01 MED ORDER — ONDANSETRON HCL 4 MG PO TABS: 4.0000 mg | ORAL_TABLET | Freq: Four times a day (QID) | ORAL | Status: DC | PRN

## 2018-05-01 MED ORDER — MONTELUKAST SODIUM 10 MG PO TABS
10.0000 mg | ORAL_TABLET | Freq: Every day | ORAL | Status: DC
  Administered 2018-05-01 – 2018-05-02 (×2): 10 mg via ORAL
  Filled 2018-05-01 (×2): qty 1

## 2018-05-01 MED ORDER — PROBENECID 500 MG PO TABS: 250.0000 mg | ORAL_TABLET | Freq: Every day | ORAL | Status: DC

## 2018-05-01 MED ORDER — GABAPENTIN 300 MG PO CAPS
300.0000 mg | ORAL_CAPSULE | Freq: Three times a day (TID) | ORAL | Status: DC
  Administered 2018-05-01 – 2018-05-03 (×5): 300 mg via ORAL
  Filled 2018-05-01 (×5): qty 1

## 2018-05-01 MED ORDER — SODIUM CHLORIDE 0.9 % IV BOLUS
500.0000 mL | Freq: Once | INTRAVENOUS | Status: AC
  Administered 2018-05-01: 500 mL via INTRAVENOUS

## 2018-05-01 MED ORDER — ALBUTEROL SULFATE (2.5 MG/3ML) 0.083% IN NEBU: 2.5000 mg | INHALATION_SOLUTION | RESPIRATORY_TRACT | Status: DC | PRN

## 2018-05-01 MED ORDER — SODIUM CHLORIDE 0.9% FLUSH
3.0000 mL | Freq: Two times a day (BID) | INTRAVENOUS | Status: DC
  Administered 2018-05-01 – 2018-05-03 (×4): 3 mL via INTRAVENOUS

## 2018-05-01 MED ORDER — LOSARTAN POTASSIUM 50 MG PO TABS
50.0000 mg | ORAL_TABLET | Freq: Every day | ORAL | Status: DC
  Administered 2018-05-02 – 2018-05-03 (×2): 50 mg via ORAL
  Filled 2018-05-01 (×2): qty 1

## 2018-05-01 MED ORDER — MEMANTINE HCL ER 28 MG PO CP24
28.0000 mg | ORAL_CAPSULE | Freq: Every day | ORAL | Status: DC
  Administered 2018-05-02 – 2018-05-03 (×2): 28 mg via ORAL
  Filled 2018-05-01 (×2): qty 1

## 2018-05-01 NOTE — ED Triage Notes (Signed)
EMS reported Pt was sitting in chair at table this AM and told family she was going to pass out. Family reported pt dropped  Her head to chest.  Pt did not fall to floor ,did not hit head. Pt was reported to have the flu last week . Hx of brady cardia with ? Of Pace maker placement.

## 2018-05-01 NOTE — Consult Note (Signed)
Cardiology Consultation:   Patient ID: AZRIELLE SPRINGSTEEN MRN: [id number]; DOB: [birth year]  Admit date: 05/01/2018 Date of Consult: 05/01/2018  Primary Care Provider: Leeroy Cha, MD Primary Cardiologist: Skeet Latch, MD  Primary Electrophysiologist:  Virl Axe, MD    Patient Profile:   TRYSTEN BERTI is a 85 y.o. female with a hx of bradycardia, dementia, hypertenstion who is being seen today for the evaluation of syncope at the request of Dr. Sherry Ruffing.  History of Present Illness:   Ms. Anderegg was recently admitted and discharged for flu. She endorses her usual state of health since discharge, though family is not sure how well she has been eating and drinking. Earlier today, she was sitting at the table, and per her granddaughter was able to express that she was about to pass out. Denies chest pain or palpitations prior. She was only out for several seconds, then returned to consciousness. Felt somewhat lightheaded on awakening, but no confusion, no loss of bowel/bladder, no chest pain/palpitations. Called EMS, who noted that her heart rate was 40s-50s (which is her baseline).   She denies any recent fevers or chills. Has had a mild cough, largely nonproductive, since leaving the hospital. No shortness of breath. Had some mild leg swelling at a recent PCP visit. Has been taking medication as prescribed.  Past Medical History:  Diagnosis Date  . Asthma   . Colostomy present ([place])    hx/notes 07/17/2010  . Dementia ([place])   . Diabetic neuropathy ([place])   . Hypertension    hx/notes 07/17/2010  . Pneumonia    recent/notes 10/08/2016  . Symptomatic bradycardia 12/15/2017  . Type II diabetes mellitus ([place])    hx/notes 07/17/2010    Past Surgical History:  Procedure Laterality Date  . ABDOMINAL HYSTERECTOMY    . COLOSTOMY     S/P SBO hx/notes 07/17/2010  . LUMBAR FUSION  11/2006   hx/notes 07/17/2010  . NASAL SINUS SURGERY  04/28/2002   Bilateral inferior turbinate  reductions, bilateral maxillary antrotomies, bilateral total ethmoidectomies, bilateral frontal recess explorations, bilateral sphenoidotomies, Instatrac guidance./notes 07/28/2010     Home Medications:  Prior to Admission medications   Medication Sig Start Date End Date Taking? Authorizing Provider  acetaminophen (TYLENOL) 325 MG tablet Take 650 mg by mouth every 6 (six) hours as needed for headache (pain).   Yes [provider]  albuterol (PROVENTIL HFA;VENTOLIN HFA) 108 (90 Base) MCG/ACT inhaler Inhale 1-2 puffs into the lungs every 4 (four) hours as needed for wheezing or shortness of breath.    Yes [provider]  alendronate (FOSAMAX) 70 MG tablet Take 70 mg by mouth every Monday.    Yes [provider]  busPIRone (BUSPAR) 5 MG tablet Take 5 mg by mouth 3 (three) times daily. 11/24/17  Yes [provider]  Calcium Citrate-Vitamin D (CALCIUM CITRATE + D) 315-250 MG-UNIT TABS Take 1 tablet by mouth daily.   Yes [provider]  donepezil (ARICEPT) 10 MG tablet Take 10 mg by mouth at bedtime.  02/24/15  Yes [provider]  escitalopram (LEXAPRO) 10 MG tablet Take 10 mg by mouth at bedtime. 06/16/17  Yes [provider]  FLOVENT HFA 220 MCG/ACT inhaler Inhale 1 puff into the lungs 2 (two) times daily. 05/28/17  Yes [provider]  gabapentin (NEURONTIN) 300 MG capsule Take 300 mg by mouth 3 (three) times daily. 10/12/15  Yes [provider]  losartan (COZAAR) 100 MG tablet Take 0.5 tablets (50 mg total) by mouth daily.  04/11/18  Yes Nita Sells, MD  memantine (NAMENDA XR) 28 MG CP24 24 hr capsule Take 28 mg by mouth daily.   Yes [provider]  montelukast (SINGULAIR) 10 MG tablet Take 10 mg by mouth at bedtime.  06/21/17  Yes [provider]  probenecid (BENEMID) 500 MG tablet Take 250 mg by mouth daily. 10/12/16   [provider]    Inpatient Medications: Scheduled  Meds:  Continuous Infusions: . sodium chloride 500 mL (05/01/18 1522)   PRN Meds:   Allergies:    Allergies  Allergen Reactions  . Aspirin Other (See Comments)    Triggers asthma    Social History:   Social History   Socioeconomic History  . Marital status: Single    Spouse name: Not on file  . Number of children: Not on file  . Years of education: Not on file  . Highest education level: Not on file  Occupational History  . Not on file  Social Needs  . Financial resource strain: Not on file  . Food insecurity:    Worry: Not on file    Inability: Not on file  . Transportation needs:    Medical: Not on file    Non-medical: Not on file  Tobacco Use  . Smoking status: Never Smoker  . Smokeless tobacco: Former Systems developer    Types: Chew  Substance and Sexual Activity  . Alcohol use: No  . Drug use: No  . Sexual activity: Never  Lifestyle  . Physical activity:    Days per week: Not on file    Minutes per session: Not on file  . Stress: Not on file  Relationships  . Social connections:    Talks on phone: Not on file    Gets together: Not on file    Attends religious service: Not on file    Active member of club or organization: Not on file    Attends meetings of clubs or organizations: Not on file    Relationship status: Not on file  . Intimate partner violence:    Fear of current or ex partner: Not on file    Emotionally abused: Not on file    Physically abused: Not on file    Forced sexual activity: Not on file  Other Topics Concern  . Not on file  Social History Narrative  . Not on file    Family History:    Family History  Problem Relation Age of Onset  . Kidney disease Mother   . Hypertension Mother   . Other Father        gangrene with amputation  . Diabetes Sister   . Cancer Brother   . Heart disease Brother   . Schizophrenia Daughter   . Diabetes Daughter   . Stroke Daughter   . Diabetes Sister   . Breast cancer Neg Hx      ROS:  Please  see the history of present illness.  Review of Systems  Constitutional: Negative for chills and fever.  HENT: Negative for congestion and sore throat.   Eyes: Negative for double vision and pain.  Respiratory: Positive for cough. Negative for hemoptysis, sputum production, shortness of breath and wheezing.   Cardiovascular: Positive for leg swelling. Negative for chest pain, palpitations, orthopnea, claudication and PND.  Gastrointestinal: Negative for abdominal pain, blood in stool, constipation, diarrhea and melena.  Genitourinary: Negative for dysuria and hematuria.  Musculoskeletal: Negative for falls and myalgias.  Skin: Negative for rash.  Neurological: Positive  for loss of consciousness. Negative for sensory change, speech change, focal weakness and headaches.  Endo/Heme/Allergies: Does not bruise/bleed easily.   All other ROS reviewed and negative.     Physical Exam/Data:   Vitals:   05/01/18 1006 05/01/18 1015 05/01/18 1030 05/01/18 1045  BP: (!) 152/63   (!) 152/67  Pulse: (!) 58 (!) 54 (!) 54 (!) 53  Resp: 15 14 11 16   Temp: 98.5 F (36.9 C)     TempSrc: Oral     SpO2: 98% 100% 98% 97%  Weight:      Height:        Intake/Output Summary (Last 24 hours) at 05/01/2018 1545 Last data filed at 05/01/2018 1522 Gross per 24 hour  Intake 1000 ml  Output -  Net 1000 ml   Last 3 Weights 05/01/2018 04/10/2018 03/01/2018  Weight (lbs) 151 lb 10.8 oz 151 lb 10.8 oz 151 lb 9.6 oz  Weight (kg) 68.8 kg 68.8 kg 68.765 kg     Body mass index is 27.74 kg/m.  General:  Well nourished, well developed, in no acute distress HEENT: normal Lymph: no adenopathy Neck: no JVD Endocrine:  No thryomegaly Vascular: No carotid bruits; RA pulses 2+ bilaterally Cardiac:  normal S1, S2; RRR; no murmur Lungs:  clear to auscultation bilaterally, no wheezing, rhonchi or rales  Abd: soft, nontender, no hepatomegaly  Ext: trace LE edema Musculoskeletal:  No deformities Skin: warm and dry   Neuro:  no focal abnormalities noted. Moves all limbs independently. Psych:  Normal affect. Poor historian  EKG:  The EKG was personally reviewed and demonstrates:  Sinus bradycardia at 58 bpm, LAFB, PRWP, borderline 1st degree AV block Telemetry:  Telemetry was personally reviewed and demonstrates:  Sinus bradycardia, no significant pauses  Relevant CV Studies: Echo 12/17/17 - Left ventricle: The cavity size was normal. Systolic function was   vigorous. The estimated ejection fraction was in the range of 65%   to 70%. Wall motion was normal; there were no regional wall   motion abnormalities. Features are consistent with a pseudonormal   left ventricular filling pattern, with concomitant abnormal   relaxation and increased filling pressure (grade 2 diastolic   dysfunction). Doppler parameters are consistent with high   ventricular filling pressure. - Ascending aorta: The ascending aorta was mildly dilated. - Mitral valve: There was mild regurgitation. - Tricuspid valve: There was mild regurgitation. - Pulmonic valve: There was mild regurgitation. - Pulmonary arteries: PA peak pressure: 42 mm Hg (S).  Impressions:  - The right ventricular systolic pressure was increased consistent   with moderate pulmonary hypertension.  Laboratory Data:  Chemistry Recent Labs  Lab 05/01/18 1027  NA 138  K 3.5  CL 105  CO2 21*  GLUCOSE 265*  BUN 8  CREATININE 1.04*  CALCIUM 8.9  GFRNONAA 50*  GFRAA 58*  ANIONGAP 12    Recent Labs  Lab 05/01/18 1027  PROT 6.2*  ALBUMIN 2.7*  AST 20  ALT 12  ALKPHOS 68  BILITOT 0.6   Hematology Recent Labs  Lab 05/01/18 1027  WBC 6.3  RBC 3.90  HGB 11.4*  HCT 38.4  MCV 98.5  MCH 29.2  MCHC 29.7*  RDW 12.7  PLT 173   Cardiac EnzymesNo results for input(s): TROPONINI in the last 168 hours.  Recent Labs  Lab 05/01/18 1031 05/01/18 1350  TROPIPOC 0.00 0.00    BNPNo results for input(s): BNP, PROBNP in the last 168 hours.   DDimer No results for input(s):  DDIMER in the last 168 hours.  Radiology/Studies:  Dg Chest 2 View  Result Date: 05/01/2018 CLINICAL DATA:  Syncope.  Bradycardia. EXAM: CHEST - 2 VIEW COMPARISON:  04/09/2018 FINDINGS: The cardiac silhouette remains mildly enlarged. No airspace consolidation, edema, pleural effusion, pneumothorax is identified. Degenerative changes are noted at the shoulders. IMPRESSION: No active cardiopulmonary disease. Electronically Signed   By: Logan Bores M.D.   On: 05/01/2018 13:09    Assessment and Plan:   Syncope: unclear etiology. Given her history of bradycardia, would be concerned for that as cause. However, she has also recently recovered from flu, with unclear oral intake  --recommend obs admission with medicine to monitor telemetry  --recommend orthostatic vital signs (getting IV fluid now)  --avoid all AV nodal agents  --will have EP see in the AM, given Dr. Olin Pia prior suggestion that a pacemaker should be discussed for syncope. However, I spoke with family at length. They are very concerned about the arm activity restrictions for a transvenous pacemaker, and they are sure even with using aids such as a sling that she will use the arm. I am not sure if she is a candidate for Micra, but if so, it may be a better option. It would have to be set to pace at a low HR given her baseline bradycardia (to avoid battery depletion), but this might make post-device instructions easier to follow. Will defer to my EP colleagues to see what they think may be the best option.  --ok to eat now, would make NPO at midnight just in case of device placement.  For questions or updates, please contact [place] Please consult [email or web address] for contact info under   Signed, Buford Dresser, MD  05/01/2018 3:45 PM

## 2018-05-01 NOTE — H&P (Signed)
History and Physical    [name] NTZ:[id number] DOB: [birth year] DOA: 05/01/2018  PCP: Leeroy Cha, MD Consultants:  [facility] - cardiology; [place] - neurology; [place] - pulmonology Patient coming from: Home - lives with granddaughter; NOK: granddaughter, Alverda Skeans [phone]  Chief Complaint:  Near syncope  HPI: [name] is a 85 y.o. female with medical history significant of DM; symptomatic bradycardia; dementia; DM; HTN; and colostomy from spontaneous rupture of colon associated with SBO presenting with near syncope.  She was sitting at the table waiting on a SW to come visit.  She said she as able to faint and she passed out.  She was still awake and able to talk throughout but her eyes were kind of blurry.  She has been feeling kind of funny for the last week. She saw the doctor Friday,  She isn't feeling too good right now.  She has dementia.  No chest pain.  No other complaints other than fatigue and malaise for the last month.  She has been sleeping on and off recently and going to bed early.   ED Course:   H/o symptomatic bradycardia, almost got a pacer last year with Dr. Caryl Comes.  Decided not to do it.  Came back with syncope with bradycardia in 40-50 range.  Recently admitted for influenza.  Got gentle IVF.  Lactate was 4, not likely sepsis, now 1.8.  Cardiology has seen and requests [place] admission for syncope.  EP to see in AM.  Review of Systems: As per HPI; otherwise review of systems reviewed and negative.   Ambulatory Status:  Ambulates with a walker  Past Medical History:  Diagnosis Date  . Asthma   . CKD (chronic kidney disease) stage 3, GFR 30-59 ml/min ([place]) 05/01/2018  . Colostomy present ([place])    hx/notes 07/17/2010  . Dementia ([place])   . Diabetic neuropathy ([place])   . Hypertension    hx/notes 07/17/2010  . Pneumonia    recent/notes 10/08/2016  . Symptomatic bradycardia 12/15/2017  . Type II diabetes mellitus ([place])    hx/notes 07/17/2010    Past  Surgical History:  Procedure Laterality Date  . ABDOMINAL HYSTERECTOMY    . COLOSTOMY     S/P SBO hx/notes 07/17/2010  . LUMBAR FUSION  11/2006   hx/notes 07/17/2010  . NASAL SINUS SURGERY  04/28/2002   Bilateral inferior turbinate reductions, bilateral maxillary antrotomies, bilateral total ethmoidectomies, bilateral frontal recess explorations, bilateral sphenoidotomies, Instatrac guidance./notes 07/28/2010    Social History   Socioeconomic History  . Marital status: Single    Spouse name: Not on file  . Number of children: Not on file  . Years of education: Not on file  . Highest education level: Not on file  Occupational History  . Not on file  Social Needs  . Financial resource strain: Not on file  . Food insecurity:    Worry: Not on file    Inability: Not on file  . Transportation needs:    Medical: Not on file    Non-medical: Not on file  Tobacco Use  . Smoking status: Never Smoker  . Smokeless tobacco: Former Systems developer    Types: Chew  Substance and Sexual Activity  . Alcohol use: No  . Drug use: No  . Sexual activity: Never  Lifestyle  . Physical activity:    Days per week: Not on file    Minutes per session: Not on file  . Stress: Not on file  Relationships  . Social connections:  Talks on phone: Not on file    Gets together: Not on file    Attends religious service: Not on file    Active member of club or organization: Not on file    Attends meetings of clubs or organizations: Not on file    Relationship status: Not on file  . Intimate partner violence:    Fear of current or ex partner: Not on file    Emotionally abused: Not on file    Physically abused: Not on file    Forced sexual activity: Not on file  Other Topics Concern  . Not on file  Social History Narrative  . Not on file    Allergies  Allergen Reactions  . Aspirin Other (See Comments)    Triggers asthma    Family History  Problem Relation Age of Onset  . Kidney disease Mother   .  Hypertension Mother   . Other Father        gangrene with amputation  . Diabetes Sister   . Cancer Brother   . Heart disease Brother   . Schizophrenia Daughter   . Diabetes Daughter   . Stroke Daughter   . Diabetes Sister   . Breast cancer Neg Hx     Prior to Admission medications   Medication Sig Start Date End Date Taking? Authorizing Provider  acetaminophen (TYLENOL) 325 MG tablet Take 650 mg by mouth every 6 (six) hours as needed for headache (pain).   Yes [provider]  albuterol (PROVENTIL HFA;VENTOLIN HFA) 108 (90 Base) MCG/ACT inhaler Inhale 1-2 puffs into the lungs every 4 (four) hours as needed for wheezing or shortness of breath.    Yes [provider]  alendronate (FOSAMAX) 70 MG tablet Take 70 mg by mouth every Monday.    Yes [provider]  busPIRone (BUSPAR) 5 MG tablet Take 5 mg by mouth 3 (three) times daily. 11/24/17  Yes [provider]  Calcium Citrate-Vitamin D (CALCIUM CITRATE + D) 315-250 MG-UNIT TABS Take 1 tablet by mouth daily.   Yes [provider]  donepezil (ARICEPT) 10 MG tablet Take 10 mg by mouth at bedtime.  02/24/15  Yes [provider]  escitalopram (LEXAPRO) 10 MG tablet Take 10 mg by mouth at bedtime. 06/16/17  Yes [provider]  FLOVENT HFA 220 MCG/ACT inhaler Inhale 1 puff into the lungs 2 (two) times daily. 05/28/17  Yes [provider]  gabapentin (NEURONTIN) 300 MG capsule Take 300 mg by mouth 3 (three) times daily. 10/12/15  Yes [provider]  losartan (COZAAR) 100 MG tablet Take 0.5 tablets (50 mg total) by mouth daily. 04/11/18  Yes Nita Sells, MD  memantine (NAMENDA XR) 28 MG CP24 24 hr capsule Take 28 mg by mouth daily.   Yes [provider]  montelukast (SINGULAIR) 10 MG tablet Take 10 mg by mouth at bedtime.  06/21/17  Yes [provider]  probenecid (BENEMID) 500 MG tablet Take 250 mg by mouth daily. 10/12/16   [provider]     Physical Exam: Vitals:   05/01/18 1006 05/01/18 1015 05/01/18 1030 05/01/18 1045  BP: (!) 152/63   (!) 152/67  Pulse: (!) 58 (!) 54 (!) 54 (!) 53  Resp: 15 14 11 16   Temp: 98.5 F (36.9 C)     TempSrc: Oral     SpO2: 98% 100% 98% 97%  Weight:      Height:         . General:  Appears calm and comfortable and is NAD . Eyes:  PERRL, EOMI, normal lids, iris . ENT:  grossly normal hearing, lips & tongue, mmm . Neck:  no LAD, masses or thyromegaly; no carotid bruits . Cardiovascular:  RR with bradycardia into 50s, no m/r/g. 1+ LE edema.  Marland Kitchen Respiratory:   CTA bilaterally with no wheezes/rales/rhonchi.  Normal respiratory effort. . Abdomen:  soft, NT, ND, NABS, colostomy . Skin:  no rash or induration seen on limited exam . Musculoskeletal:  grossly normal tone BUE/BLE, good ROM, no bony abnormality . Psychiatric:  grossly normal mood and affect, speech fluent and appropriate, AOx2 . Neurologic:  CN 2-12 grossly intact, moves all extremities in coordinated fashion, sensation intact    Radiological Exams on Admission: Dg Chest 2 View  Result Date: 05/01/2018 CLINICAL DATA:  Syncope.  Bradycardia. EXAM: CHEST - 2 VIEW COMPARISON:  04/09/2018 FINDINGS: The cardiac silhouette remains mildly enlarged. No airspace consolidation, edema, pleural effusion, pneumothorax is identified. Degenerative changes are noted at the shoulders. IMPRESSION: No active cardiopulmonary disease. Electronically Signed   By: Logan Bores M.D.   On: 05/01/2018 13:09    EKG: Independently reviewed.  NSR with rate 58; LAFB; nonspecific ST changes with no evidence of acute ischemia, NSCSLT   Labs on Admission: I have personally reviewed the available labs and imaging studies at the time of the admission.  Pertinent labs:   CO2 21 - improved Glucose 265 BUN 8/Creatinine 1.04/GFR 50 - stable Albumin 2.7 Troponin 0.00 x 2 Lacate 4.0, 1.8 Stable CBC TSH 1.735 UA: moderate Hgb  Assessment/Plan Principal  Problem:   Near syncope Active Problems:   Diabetes mellitus with complication (HCC)   Dementia (HCC)   Essential hypertension   COPD (chronic obstructive pulmonary disease) (HCC)   Symptomatic bradycardia   CKD (chronic kidney disease) stage 3, GFR 30-59 ml/min (HCC)   Near syncope, likely related to symptomatic bradycardia  -Etiology is not definitive but appears to be related to symptomatic bradycardia -She has been followed for this for some time, and documentation from Dr. Caryl Comes in 10/19 reports that, with input from her granddaughter, "we ave elected an approach of intense observation." -Based on this presumption as the cause, cardiology was consulted -Cardiology requested [place] admission and EP will see the patient in the AM -Will place in observation status for now -Will monitor on telemetry -Orthostatic vital signs now and in AM -Neuro checks   Dementia -Patient with moderate dementia -Continue home meds, including Buspar, Aricept, Namenda, Lexapro, and Neurontin  DM -A1c in 7/18 was 7.3 -Her ideal glucose is around 180 given her age and comorbidities -Will cover with sensitive scale SSI as inpatient without qhs coverage -She likely does not need ongoing outpatient management at this time, but will follow as inpatient to see how her control is  HTN -Continue Cozaar -Avoid agents which may negatively affect her heart rate  COPD -Continue Singulair, Flovent -Will use prn Albuterol nebs -Appears to be compensated at this time  Stage 3 CKD -Appears to be stable at this time -Will follow -Checking orthostatics now and in AM  DVT prophylaxis:  SCDs Code Status:  Full - confirmed with patient/family Family Communication: Granddaughter was present throughout evaluation  Disposition Plan:  Home once clinically improved Consults called: Cardiology; EP  Admission status: It is my clinical opinion that referral for OBSERVATION is reasonable and necessary in this patient  based on the above information provided. The aforementioned taken together are felt to place the patient  at high risk for further clinical deterioration. However it is anticipated that the patient may be medically stable for discharge from the hospital within 24 to 48 hours.    Karmen Bongo MD [facility]   How to contact the [facility] Attending or Consulting provider [place] or covering provider during after hours [place], for this patient?  1. Check the care team in [facility] and look for a) attending/consulting [facility] provider listed and b) the [facility] team listed 2. Log into [email or web address] and use [place]'s universal password to access. If you do not have the password, please contact the hospital operator. 3. Locate the [facility] provider you are looking for under [facility] and page to a number that you can be directly reached. 4. If you still have difficulty reaching the provider, please page the [facility] (Director on Call) for the Hospitalists listed on amion for assistance.   05/01/2018, 4:37 PM

## 2018-05-01 NOTE — ED Triage Notes (Signed)
IV team unable to access for IV site.

## 2018-05-01 NOTE — ED Provider Notes (Signed)
[facility] EMERGENCY DEPARTMENT Provider Note   CSN: [id number] Arrival date & time: 05/01/18  0957     History   Chief Complaint Chief Complaint  Patient presents with  . Near Syncope    HPI [name] is a 85 y.o. female.  The history is provided by the patient and medical records. No language interpreter was used.  Loss of Consciousness  Episode history:  Single Most recent episode:  Today Timing:  Sporadic Progression:  Waxing and waning Chronicity:  Recurrent Witnessed: yes   Relieved by:  Nothing Worsened by:  Nothing Ineffective treatments:  None tried Associated symptoms: no chest pain, no confusion, no diaphoresis, no difficulty breathing, no dizziness, no fever, no focal weakness, no headaches, no malaise/fatigue, no nausea, no palpitations, no recent fall, no recent injury, no seizures, no shortness of breath, no visual change, no vomiting and no weakness     Past Medical History:  Diagnosis Date  . Asthma   . Colostomy present ([place])    hx/notes 07/17/2010  . Dementia ([place])   . Diabetic neuropathy ([place])   . Hypertension    hx/notes 07/17/2010  . Pneumonia    recent/notes 10/08/2016  . Symptomatic bradycardia 12/15/2017  . Type II diabetes mellitus ([place])    hx/notes 07/17/2010    Patient Active Problem List   Diagnosis Date Noted  . Influenza B 04/10/2018  . Generalized weakness 04/10/2018  . Essential hypertension 03/01/2018  . COPD (chronic obstructive pulmonary disease) ([place]) 03/01/2018  . Bradycardia 12/15/2017  . AKI (acute kidney injury) ([place]) 07/18/2017  . Colostomy present ([place]) 11/08/2016  . Syncope 10/08/2016  . Acute kidney injury ([place]) 10/08/2016  . Abnormal chest x-ray 10/08/2016  . Lethargy 10/08/2016  . Dementia ([place])   . Sepsis ([place]) 09/05/2016  . Community acquired pneumonia 09/05/2016  . Toxic metabolic encephalopathy 41/96/2229  . Diabetes mellitus with complication ([place]) 79/89/2119  . Nausea & vomiting  04/09/2012  . Gallstones 03/15/2011  . Partial SBO 03/15/2011  . Colostomy hernia ([place]) 03/15/2011  . PSEUDOGOUT 02/28/2007  . IRON DEFIC ANEMIA [place] DIET IRON INTAKE 02/28/2007  . DISCITIS 02/28/2007  . OSTEOMYELITIS 02/28/2007  . CHILLS WITHOUT FEVER 02/28/2007    Past Surgical History:  Procedure Laterality Date  . ABDOMINAL HYSTERECTOMY    . COLOSTOMY     S/P SBO hx/notes 07/17/2010  . LUMBAR FUSION  11/2006   hx/notes 07/17/2010  . NASAL SINUS SURGERY  04/28/2002   Bilateral inferior turbinate reductions, bilateral maxillary antrotomies, bilateral total ethmoidectomies, bilateral frontal recess explorations, bilateral sphenoidotomies, Instatrac guidance./notes 07/28/2010     OB History   No obstetric history on file.      Home Medications    Prior to Admission medications   Medication Sig Start Date End Date Taking? Authorizing Provider  acetaminophen (TYLENOL) 325 MG tablet Take 650 mg by mouth every 6 (six) hours as needed for headache (pain).    [provider]  albuterol (PROVENTIL HFA;VENTOLIN HFA) 108 (90 Base) MCG/ACT inhaler Inhale 1-2 puffs into the lungs every 4 (four) hours as needed for wheezing or shortness of breath.     [provider]  albuterol (PROVENTIL) (2.5 MG/3ML) 0.083% nebulizer solution Inhale 2.5 mg into the lungs as needed. 05/26/17   [provider]  alendronate (FOSAMAX) 70 MG tablet Take 70 mg by mouth every Monday.     [provider]  busPIRone (BUSPAR) 5 MG tablet Take 5 mg by mouth 3 (three) times daily. 11/24/17  [provider]  Calcium Citrate-Vitamin D (CALCIUM CITRATE + D) 315-250 MG-UNIT TABS Take 1 tablet by mouth daily.    [provider]  donepezil (ARICEPT) 10 MG tablet Take 10 mg by mouth at bedtime.  02/24/15   [provider]  escitalopram (LEXAPRO) 10 MG tablet Take 10 mg by mouth at bedtime. 06/16/17   [provider]  FLOVENT HFA 220 MCG/ACT inhaler Inhale 1  puff into the lungs 2 (two) times daily. 05/28/17   [provider]  gabapentin (NEURONTIN) 300 MG capsule Take 300 mg by mouth 3 (three) times daily. 10/12/15   [provider]  losartan (COZAAR) 100 MG tablet Take 0.5 tablets (50 mg total) by mouth daily. 04/11/18   Nita Sells, MD  memantine (NAMENDA XR) 28 MG CP24 24 hr capsule Take 28 mg by mouth daily.    [provider]  montelukast (SINGULAIR) 10 MG tablet Take 10 mg by mouth at bedtime.  06/21/17   [provider]  oseltamivir (TAMIFLU) 30 MG capsule Take 1 capsule (30 mg total) by mouth at bedtime. 04/11/18   Nita Sells, MD  probenecid (BENEMID) 500 MG tablet Take 250 mg by mouth daily. 10/12/16   [provider]    Family History Family History  Problem Relation Age of Onset  . Kidney disease Mother   . Hypertension Mother   . Other Father        gangrene with amputation  . Diabetes Sister   . Cancer Brother   . Heart disease Brother   . Schizophrenia Daughter   . Diabetes Daughter   . Stroke Daughter   . Diabetes Sister   . Breast cancer Neg Hx     Social History Social History   Tobacco Use  . Smoking status: Never Smoker  . Smokeless tobacco: Former Systems developer    Types: Chew  Substance Use Topics  . Alcohol use: No  . Drug use: No     Allergies   Aspirin   Review of Systems Review of Systems  Constitutional: Negative for chills, diaphoresis, fatigue, fever and malaise/fatigue.  HENT: Negative for congestion and rhinorrhea.   Respiratory: Negative for cough, chest tightness, shortness of breath and wheezing.   Cardiovascular: Positive for syncope. Negative for chest pain, palpitations and leg swelling.  Gastrointestinal: Negative for abdominal pain, constipation, diarrhea, nausea and vomiting.  Genitourinary: Negative for dysuria, flank pain and frequency.  Musculoskeletal: Negative for back pain, neck pain and neck stiffness.  Skin: Negative for rash  and wound.  Neurological: Positive for syncope and light-headedness. Negative for dizziness, focal weakness, seizures, weakness and headaches.  Psychiatric/Behavioral: Negative for agitation and confusion.  All other systems reviewed and are negative.    Physical Exam Updated Vital Signs There were no vitals taken for this visit.  Physical Exam Vitals signs and nursing note reviewed.  Constitutional:      General: She is not in acute distress.    Appearance: She is well-developed. She is not ill-appearing, toxic-appearing or diaphoretic.  HENT:     Head: Normocephalic and atraumatic.     Right Ear: External ear normal.     Left Ear: External ear normal.     Nose: Nose normal. No congestion or rhinorrhea.     Mouth/Throat:     Pharynx: No oropharyngeal exudate.  Eyes:     Conjunctiva/sclera: Conjunctivae normal.     Pupils: Pupils are equal, round, and reactive to light.  Neck:     Musculoskeletal: Normal  range of motion and neck supple. No muscular tenderness.  Cardiovascular:     Rate and Rhythm: Regular rhythm. Bradycardia present.     Pulses: Normal pulses.     Heart sounds: No murmur.  Pulmonary:     Effort: No respiratory distress.     Breath sounds: No stridor. No wheezing, rhonchi or rales.  Chest:     Chest wall: No tenderness.  Abdominal:     General: There is no distension.     Tenderness: There is no abdominal tenderness. There is no rebound.  Musculoskeletal:        General: No tenderness or signs of injury.     Right lower leg: No edema.     Left lower leg: No edema.  Skin:    General: Skin is warm.     Coloration: Skin is not pale.     Findings: No erythema or rash.  Neurological:     General: No focal deficit present.     Mental Status: She is alert and oriented to person, place, and time.     Motor: No abnormal muscle tone.     Coordination: Coordination normal.     Deep Tendon Reflexes: Reflexes are normal and symmetric.  Psychiatric:         Mood and Affect: Mood normal.      ED Treatments / Results  Labs (all labs ordered are listed, but only abnormal results are displayed) Labs Reviewed  CBC WITH DIFFERENTIAL/PLATELET - Abnormal; Notable for the following components:      Result Value   Hemoglobin 11.4 (*)    MCHC 29.7 (*)    All other components within normal limits  COMPREHENSIVE METABOLIC PANEL - Abnormal; Notable for the following components:   CO2 21 (*)    Glucose, Bld 265 (*)    Creatinine, Ser 1.04 (*)    Total Protein 6.2 (*)    Albumin 2.7 (*)    GFR calc non Af Amer 50 (*)    GFR calc Af Amer 58 (*)    All other components within normal limits  LACTIC ACID, PLASMA - Abnormal; Notable for the following components:   Lactic Acid, Venous 4.0 (*)    All other components within normal limits  MAGNESIUM - Abnormal; Notable for the following components:   Magnesium 1.4 (*)    All other components within normal limits  URINALYSIS, ROUTINE W REFLEX MICROSCOPIC - Abnormal; Notable for the following components:   Hgb urine dipstick MODERATE (*)    All other components within normal limits  URINE CULTURE  LIPASE, BLOOD  LACTIC ACID, PLASMA  TSH  I-STAT TROPONIN, ED  I-STAT TROPONIN, ED    EKG EKG Interpretation  Date/Time:  Sunday May 01 2018 10:04:27 EST Ventricular Rate:  58 PR Interval:    QRS Duration: 101 QT Interval:  434 QTC Calculation: 427 R Axis:   -48 Text Interpretation:  Sinus rhythm Left anterior fascicular block Abnormal R-wave progression, late transition When compared to prior, no signfiant changes seen. No STEMI Confirmed by Antony Blackbird [phone]) on 05/01/2018 10:21:39 AM   Radiology Dg Chest 2 View  Result Date: 05/01/2018 CLINICAL DATA:  Syncope.  Bradycardia. EXAM: CHEST - 2 VIEW COMPARISON:  04/09/2018 FINDINGS: The cardiac silhouette remains mildly enlarged. No airspace consolidation, edema, pleural effusion, pneumothorax is identified. Degenerative changes are noted at  the shoulders. IMPRESSION: No active cardiopulmonary disease. Electronically Signed   By: Logan Bores M.D.   On: 05/01/2018 13:09  Procedures Procedures (including critical care time)  EMERGENCY DEPARTMENT  US GUIDANCE EXAM Emergency Ultrasound:  US Guidance for Needle Guidance  INDICATIONS: Difficult vascular access Linear probe used in real-time to visualize location of needle entry through skin.   PERFORMED BY: Myself IMAGES ARCHIVED?: Yes LIMITATIONS: scar tissue VIEWS USED: Transverse INTERPRETATION: Needle visualized within vein, Right arm and Needle gauge 18    Medications Ordered in ED Medications  sodium chloride 0.9 % bolus 500 mL (has no administration in time range)     Initial Impression / Assessment and Plan / ED Course  I have reviewed the triage vital signs and the nursing notes.  Pertinent labs & imaging results that were available during my care of the patient were reviewed by me and considered in my medical decision making (see chart for details).     SHATOYA ROETS is a 85 y.o. female with a past medical history significant for recurrent symptomatic bradycardia, hypertension, dementia, asthma, diabetes, colostomy and COPD who presents with syncopal episode.  Patient brought in by EMS after she had a syncopal to this morning with family.  She was at the breakfast table when she passed out for several seconds.  No reported seizure activity.  Patient reports that she does not member what happened and did not feel any preceding symptoms.  She denies any chest pain or palpitations beforehand.  She reports she does not have any chest pain after but has felt lightheaded.  She reports that she got lightheaded when she tried to walk to the ambulance.  EMS reports that she has been bradycardic with rates in the 40s and 50s.  Patient reports that she had influenza several weeks ago but has been doing better.  No recent congestion, cough, shortness of breath or chest pain.   She denies any nausea vomiting, urinary symptoms or current GI symptoms.  She denies recent medication changes.  She otherwise says she may not have been drinking and was fluid this week getting over the flu.  On exam, patient is bradycardic with rates in the 50s.  Patient is not palpable pulses in extremities.  Lungs were clear and chest was nontender.  Abdomen was nontender.  Patient mentating normally.  Clinical aspect patient had another episode of symptomatic bradycardia causing her syncopal/near syncopal episode.  Chart review shows that patient nearly had a pacemaker placed last year where they decided for strict observation and pacemaker placement if she had recurrent episodes of syncope or falls.  Patient will work-up to look for dehydration, electrolyte imbalance or occult infection given her recent admission for influenza.  Patient will be given a small amount of fluids as her lungs did not sound crackly.  Anticipate speaking with cardiology after work-up given likelihood of recurrent symptomatic bradycardia and syncopal episode.  3:43 PM Work-up was overall reassuring however patient still remains bradycardic between 40s and 50s.  Cardiology came to see the patient and feel she is admitted overnight to the medicine service on telemetry for EP to see in the morning.  They will follow along.  Hospitalist team called for admission.   Final Clinical Impressions(s) / ED Diagnoses   Final diagnoses:  Syncope, unspecified syncope type  Symptomatic bradycardia    ED Discharge Orders    None      Clinical Impression: 1. Syncope, unspecified syncope type   2. Symptomatic bradycardia     Disposition: Admit  This note was prepared with assistance of Dragon voice recognition software. Occasional wrong-word or sound-a-like  substitutions may have occurred due to the inherent limitations of voice recognition software.     Amaia Lavallie, Gwenyth Allegra, MD 05/01/18 [phone]

## 2018-05-01 NOTE — ED Triage Notes (Signed)
Consult to IV team  Unable to access at this time . Blood work drawn by lab.

## 2018-05-02 ENCOUNTER — Inpatient Hospital Stay (HOSPITAL_COMMUNITY)
Admission: EM | Disposition: A | Discharge status: Home / Self Care | Payer: Self-pay | Source: Home / Self Care | Attending: Internal Medicine

## 2018-05-02 DIAGNOSIS — F0390 Unspecified dementia without behavioral disturbance: Secondary | ICD-10-CM | POA: Diagnosis present

## 2018-05-02 DIAGNOSIS — I10 Essential (primary) hypertension: Secondary | ICD-10-CM

## 2018-05-02 DIAGNOSIS — E118 Type 2 diabetes mellitus with unspecified complications: Secondary | ICD-10-CM

## 2018-05-02 DIAGNOSIS — Z7983 Long term (current) use of bisphosphonates: Secondary | ICD-10-CM | POA: Diagnosis not present

## 2018-05-02 DIAGNOSIS — D631 Anemia in chronic kidney disease: Secondary | ICD-10-CM | POA: Diagnosis present

## 2018-05-02 DIAGNOSIS — N289 Disorder of kidney and ureter, unspecified: Secondary | ICD-10-CM | POA: Diagnosis not present

## 2018-05-02 DIAGNOSIS — Z886 Allergy status to analgesic agent status: Secondary | ICD-10-CM | POA: Diagnosis not present

## 2018-05-02 DIAGNOSIS — F0150 Vascular dementia without behavioral disturbance: Secondary | ICD-10-CM | POA: Diagnosis not present

## 2018-05-02 DIAGNOSIS — I495 Sick sinus syndrome: Secondary | ICD-10-CM | POA: Diagnosis not present

## 2018-05-02 DIAGNOSIS — E872 Acidosis: Secondary | ICD-10-CM | POA: Diagnosis present

## 2018-05-02 DIAGNOSIS — E1122 Type 2 diabetes mellitus with diabetic chronic kidney disease: Secondary | ICD-10-CM | POA: Diagnosis present

## 2018-05-02 DIAGNOSIS — Z933 Colostomy status: Secondary | ICD-10-CM | POA: Diagnosis not present

## 2018-05-02 DIAGNOSIS — J449 Chronic obstructive pulmonary disease, unspecified: Secondary | ICD-10-CM | POA: Diagnosis present

## 2018-05-02 DIAGNOSIS — I129 Hypertensive chronic kidney disease with stage 1 through stage 4 chronic kidney disease, or unspecified chronic kidney disease: Secondary | ICD-10-CM | POA: Diagnosis present

## 2018-05-02 DIAGNOSIS — N183 Chronic kidney disease, stage 3 (moderate): Secondary | ICD-10-CM | POA: Diagnosis present

## 2018-05-02 DIAGNOSIS — I444 Left anterior fascicular block: Secondary | ICD-10-CM | POA: Diagnosis present

## 2018-05-02 DIAGNOSIS — R55 Syncope and collapse: Secondary | ICD-10-CM | POA: Diagnosis present

## 2018-05-02 DIAGNOSIS — E1140 Type 2 diabetes mellitus with diabetic neuropathy, unspecified: Secondary | ICD-10-CM | POA: Diagnosis present

## 2018-05-02 DIAGNOSIS — I272 Pulmonary hypertension, unspecified: Secondary | ICD-10-CM | POA: Diagnosis present

## 2018-05-02 DIAGNOSIS — Z833 Family history of diabetes mellitus: Secondary | ICD-10-CM | POA: Diagnosis not present

## 2018-05-02 DIAGNOSIS — I440 Atrioventricular block, first degree: Secondary | ICD-10-CM | POA: Diagnosis present

## 2018-05-02 DIAGNOSIS — R001 Bradycardia, unspecified: Secondary | ICD-10-CM | POA: Diagnosis present

## 2018-05-02 DIAGNOSIS — Z8249 Family history of ischemic heart disease and other diseases of the circulatory system: Secondary | ICD-10-CM | POA: Diagnosis not present

## 2018-05-02 DIAGNOSIS — Z841 Family history of disorders of kidney and ureter: Secondary | ICD-10-CM | POA: Diagnosis not present

## 2018-05-02 DIAGNOSIS — Z79899 Other long term (current) drug therapy: Secondary | ICD-10-CM | POA: Diagnosis not present

## 2018-05-02 HISTORY — PX: PACEMAKER IMPLANT: EP1218

## 2018-05-02 LAB — GLUCOSE, CAPILLARY
Glucose-Capillary: 138 mg/dL — ABNORMAL HIGH (ref 70–99)
Glucose-Capillary: 183 mg/dL — ABNORMAL HIGH (ref 70–99)
Glucose-Capillary: 129 mg/dL — ABNORMAL HIGH (ref 70–99)
Glucose-Capillary: 199 mg/dL — ABNORMAL HIGH (ref 70–99)

## 2018-05-02 LAB — CBC
HEMATOCRIT: 35.4 % — AB (ref 36.0–46.0)
Hemoglobin: 11 g/dL — ABNORMAL LOW (ref 12.0–15.0)
MCH: 29.5 pg (ref 26.0–34.0)
MCHC: 31.1 g/dL (ref 30.0–36.0)
MCV: 94.9 fL (ref 80.0–100.0)
Platelets: 181 10*3/uL (ref 150–400)
RBC: 3.73 MIL/uL — ABNORMAL LOW (ref 3.87–5.11)
RDW: 12.5 % (ref 11.5–15.5)
WBC: 6.3 10*3/uL (ref 4.0–10.5)
nRBC: 0 % (ref 0.0–0.2)

## 2018-05-02 LAB — BASIC METABOLIC PANEL
Anion gap: 9 (ref 5–15)
BUN: 8 mg/dL (ref 8–23)
CHLORIDE: 108 mmol/L (ref 98–111)
CO2: 23 mmol/L (ref 22–32)
Calcium: 8.1 mg/dL — ABNORMAL LOW (ref 8.9–10.3)
Creatinine, Ser: 0.85 mg/dL (ref 0.44–1.00)
GFR calc Af Amer: >60 mL/min (ref >60)
GFR calc non Af Amer: >60 mL/min (ref >60)
Glucose, Bld: 141 mg/dL — ABNORMAL HIGH (ref 70–99)
Potassium: 3.8 mmol/L (ref 3.5–5.1)
SODIUM: 140 mmol/L (ref 135–145)

## 2018-05-02 LAB — URINE CULTURE: Culture: NO GROWTH

## 2018-05-02 LAB — SURGICAL PCR SCREEN
MRSA, PCR: NEGATIVE
Staphylococcus aureus: NEGATIVE

## 2018-05-02 SURGERY — PACEMAKER IMPLANT

## 2018-05-02 MED ORDER — CHLORHEXIDINE GLUCONATE 4 % EX LIQD
60.0000 mL | Freq: Once | CUTANEOUS | Status: AC
  Administered 2018-05-02: 4 via TOPICAL

## 2018-05-02 MED ORDER — HEPARIN (PORCINE) IN NACL 1000-0.9 UT/500ML-% IV SOLN
INTRAVENOUS | Status: AC
  Filled 2018-05-02: qty 500

## 2018-05-02 MED ORDER — SODIUM CHLORIDE 0.9 % IV SOLN
INTRAVENOUS | Status: AC
  Filled 2018-05-02: qty 2

## 2018-05-02 MED ORDER — SODIUM CHLORIDE 0.9% FLUSH: 3.0000 mL | INTRAVENOUS | Status: DC | PRN

## 2018-05-02 MED ORDER — SODIUM CHLORIDE 0.9 % IV SOLN: 250.0000 mL | INTRAVENOUS | Status: DC

## 2018-05-02 MED ORDER — SODIUM CHLORIDE 0.9 % IV SOLN
80.0000 mg | INTRAVENOUS | Status: AC
  Administered 2018-05-02: 80 mg
  Filled 2018-05-02: qty 2

## 2018-05-02 MED ORDER — HEPARIN (PORCINE) IN NACL 1000-0.9 UT/500ML-% IV SOLN
INTRAVENOUS | Status: DC | PRN
  Administered 2018-05-02: 500 mL

## 2018-05-02 MED ORDER — SODIUM CHLORIDE 0.9% FLUSH
3.0000 mL | Freq: Two times a day (BID) | INTRAVENOUS | Status: DC
  Administered 2018-05-02: 3 mL via INTRAVENOUS

## 2018-05-02 MED ORDER — ONDANSETRON HCL 4 MG/2ML IJ SOLN: 4.0000 mg | Freq: Four times a day (QID) | INTRAMUSCULAR | Status: DC | PRN

## 2018-05-02 MED ORDER — CEFAZOLIN SODIUM-DEXTROSE 2-4 GM/100ML-% IV SOLN
2.0000 g | INTRAVENOUS | Status: AC
  Administered 2018-05-02: 2 g via INTRAVENOUS
  Filled 2018-05-02: qty 100

## 2018-05-02 MED ORDER — LIDOCAINE HCL 1 % IJ SOLN
INTRAMUSCULAR | Status: AC
  Filled 2018-05-02: qty 60

## 2018-05-02 MED ORDER — HYDRALAZINE HCL 20 MG/ML IJ SOLN
10.0000 mg | INTRAMUSCULAR | Status: DC | PRN
  Administered 2018-05-02: 10 mg via INTRAVENOUS
  Filled 2018-05-02: qty 1

## 2018-05-02 MED ORDER — ACETAMINOPHEN 325 MG PO TABS: 325.0000 mg | ORAL_TABLET | ORAL | Status: DC | PRN

## 2018-05-02 MED ORDER — LIDOCAINE HCL (PF) 1 % IJ SOLN
INTRAMUSCULAR | Status: DC | PRN
  Administered 2018-05-02: 45 mL

## 2018-05-02 MED ORDER — SODIUM CHLORIDE 0.9 % IV SOLN
INTRAVENOUS | Status: DC
  Administered 2018-05-02: 11:00:00 via INTRAVENOUS

## 2018-05-02 MED ORDER — CEFAZOLIN SODIUM-DEXTROSE 2-4 GM/100ML-% IV SOLN
INTRAVENOUS | Status: AC
  Filled 2018-05-02: qty 100

## 2018-05-02 MED ORDER — CEFAZOLIN SODIUM-DEXTROSE 1-4 GM/50ML-% IV SOLN
1.0000 g | Freq: Four times a day (QID) | INTRAVENOUS | Status: AC
  Administered 2018-05-02 – 2018-05-03 (×3): 1 g via INTRAVENOUS
  Filled 2018-05-02 (×3): qty 50

## 2018-05-02 MED ORDER — CHLORHEXIDINE GLUCONATE 4 % EX LIQD: 60.0000 mL | Freq: Once | CUTANEOUS | Status: DC

## 2018-05-02 MED ORDER — MAGNESIUM SULFATE 2 GM/50ML IV SOLN
2.0000 g | Freq: Once | INTRAVENOUS | Status: AC
  Administered 2018-05-02: 2 g via INTRAVENOUS
  Filled 2018-05-02: qty 50

## 2018-05-02 SURGICAL SUPPLY — 7 items
CABLE SURGICAL S-101-97-12 (CABLE) ×2 IMPLANT
LEAD TENDRIL MRI 46CM LPA1200M (Lead) ×1 IMPLANT
LEAD TENDRIL MRI 52CM LPA1200M (Lead) ×1 IMPLANT
PACEMAKER ASSURITY DR-RF (Pacemaker) ×2 IMPLANT
PAD PRO RADIOLUCENT 2001M-C (PAD) ×2 IMPLANT
SHEATH CLASSIC 8F (SHEATH) ×2 IMPLANT
TRAY PACEMAKER INSERTION (PACKS) ×2 IMPLANT

## 2018-05-02 NOTE — Discharge Instructions (Signed)
° ° °  Supplemental Discharge Instructions for  Pacemaker/Defibrillator Patients  Activity No heavy lifting or vigorous activity with your left/right arm for 6 to 8 weeks.  Do not raise your left/right arm above your head for one week.  Gradually raise your affected arm as drawn below.              05/06/2018                 05/07/2018               05/08/2018               05/09/2018 __  NO DRIVING (patient does not drive0.  WOUND CARE - Keep the wound area clean and dry.  Do not get this area wet for one week. No showers for one week; you may shower on  05/09/2018   . - The tape/steri-strips on your wound will fall off; do not pull them off.  No bandage is needed on the site.  DO  NOT apply any creams, oils, or ointments to the wound area. - If you notice any drainage or discharge from the wound, any swelling or bruising at the site, or you develop a fever > 101? F after you are discharged home, call the office at once.  Special Instructions - You are still able to use cellular telephones; use the ear opposite the side where you have your pacemaker/defibrillator.  Avoid carrying your cellular phone near your device. - When traveling through airports, show security personnel your identification card to avoid being screened in the metal detectors.  Ask the security personnel to use the hand wand. - Avoid arc welding equipment, MRI testing (magnetic resonance imaging), TENS units (transcutaneous nerve stimulators).  Call the office for questions about other devices. - Avoid electrical appliances that are in poor condition or are not properly grounded. - Microwave ovens are safe to be near or to operate.

## 2018-05-02 NOTE — Progress Notes (Signed)
Pt to cath lab for pacemaker implant, pt has been NPO except sips with meds this am, ivf infusing at ordered, family at bedside, pt stable to cath lab

## 2018-05-02 NOTE — Progress Notes (Addendum)
Progress Note    FERNE ELLINGWOOD  QIH:[id number] DOB: Nov 05, 1933  DOA: 05/01/2018 PCP: Leeroy Cha, MD    Brief Narrative:   Chief complaint: Syncope  Medical records reviewed and are as summarized below:  CARROLL RANNEY is an 85 y.o. female with pmh HTN, symptomatic bradycardia, dementia, and colostomy problems spontaneous rupture of the colon associated with SBO; who presented with complaints of syncope per family.  Assessment/Plan:   Principal Problem:   Near syncope Active Problems:   Diabetes mellitus with complication (HCC)   Dementia (HCC)   Essential hypertension   COPD (chronic obstructive pulmonary disease) (HCC)   Symptomatic bradycardia   CKD (chronic kidney disease) stage 3, GFR 30-59 ml/min (HCC)  Syncope, symptomatic bradycardia: Keep.  Patient presented after having syncopal episode might be related to symptomatic bradycardia.  Heart rates in the 40-50s overnight.  Previously documented in 12/2017 by Dr. Caryl Comes, but family elected for observation at that time.  Patient on medications of Aricept and Namenda which also can cause symptoms.  Dr. Lovena Le cardiology was consulted and recommended pacemaker placement. -Discontinue IV fluids after procedure -Appreciate cardiology consultative services will follow-up for further recommendation -Check CBC in a.m.  Normocytic normochromic anemia: Stable. -Recheck CBC after procedure  Moderate dementia: Stable.  Family reports patient significantly helped by medications. -Continue Aricept and Namenda  Diabetes mellitus type 2: Last hemoglobin A1c noted to be 7.3 on 09/2016.  Patient currently not on any home medications for diabetes. -Hypoglycemic protocols -Check hemoglobin A1c in a.m. -Continue CBGs with SSI  Essential hypertension: Patient blood pressures elevated up to 180/80. -Continue losartan  Hypomagnesia: Acute.  Initial magnesium noted to be 1.4.  Patient received 2 g of magnesium sulfate  IV. -Continue to monitor and replace as needed  Lactic acidosis: Resolved.  Admission lactic acid level 4, but resolved on recheck.  Suspect from dehydration and/or hypoperfusion.  Renal insufficiency: Resolved.  Patient's admission creatinine noted to be 1.04,, but after IV fluids improved to 0.85 on hospital day 2. -Continue to monitor   Body mass index is 27.18 kg/m.   Family Communication/Anticipated D/C date and plan/Code Status   DVT prophylaxis: SCDs Code Status: Full Code.  Family Communication: Discussed plan of care with the patient family present at bedside Disposition Plan: Possible discharge home in 1 to 2 days   Medical Consultants:    Cardiology   Anti-Infectives:    None  Subjective:   Patient still continued to feel weak per family and had recently gotten over the flu 3 weeks ago. Nursing reported subsequent episode earlier today where she felt lightheaded as though she could pass out, but electrolytes within normal limits..  Objective:    Vitals:   05/02/18 0748 05/02/18 0854 05/02/18 0943 05/02/18 1230  BP: (!) 180/80  (!) 150/69 (!) 107/57  Pulse: 60  (!) 54 63  Resp:    16  Temp:    97.7 F (36.5 C)  TempSrc:    Oral  SpO2: 99% 97% 98% 100%  Weight:      Height:        Intake/Output Summary (Last 24 hours) at 05/02/2018 1546 Last data filed at 05/02/2018 1500 Gross per 24 hour  Intake 2835.41 ml  Output 1250 ml  Net 1585.41 ml   Filed Weights   05/01/18 1005 05/02/18 0737  Weight: 68.8 kg 67.4 kg    Exam: Constitutional: Elderly female in NAD, calm, comfortable Eyes: PERRL, lids and conjunctivae normal ENMT: Mucous membranes  are moist. Posterior pharynx clear of any exudate or lesions.  Neck: normal, supple, no masses, no thyromegaly Respiratory: clear to auscultation bilaterally, no wheezing, no crackles. Normal respiratory effort. No accessory muscle use.  Cardiovascular: Bradycardic no murmurs / rubs / gallops. No extremity  edema. 2+ pedal pulses. No carotid bruits.  Abdomen: no tenderness, no masses palpated. No hepatosplenomegaly. Bowel sounds positive.  Musculoskeletal: no clubbing / cyanosis. No joint deformity upper and lower extremities. Good ROM, no contractures. Normal muscle tone.  Skin: no rashes, lesions, ulcers. No induration Neurologic: CN 2-12 grossly intact. Sensation intact, DTR normal. Strength 5/5 in all 4.  Psychiatric: Normal judgment and insight. Alert and oriented x 2. Normal mood.    Data Reviewed:   I have personally reviewed following labs and imaging studies:  Labs: Labs show the following:   Basic Metabolic Panel: Recent Labs  Lab 05/01/18 1027 05/02/18 0547  NA 138 140  K 3.5 3.8  CL 105 108  CO2 21* 23  GLUCOSE 265* 141*  BUN 8 8  CREATININE 1.04* 0.85  CALCIUM 8.9 8.1*  MG 1.4*  --    GFR Estimated Creatinine Clearance: 45.9 mL/min (by C-G formula based on SCr of 0.85 mg/dL). Liver Function Tests: Recent Labs  Lab 05/01/18 1027  AST 20  ALT 12  ALKPHOS 68  BILITOT 0.6  PROT 6.2*  ALBUMIN 2.7*   Recent Labs  Lab 05/01/18 1027  LIPASE 37   No results for input(s): AMMONIA in the last 168 hours. Coagulation profile No results for input(s): INR, PROTIME in the last 168 hours.  CBC: Recent Labs  Lab 05/01/18 1027 05/02/18 0547  WBC 6.3 6.3  NEUTROABS 4.1  --   HGB 11.4* 11.0*  HCT 38.4 35.4*  MCV 98.5 94.9  PLT 173 181   Cardiac Enzymes: No results for input(s): CKTOTAL, CKMB, CKMBINDEX, TROPONINI in the last 168 hours. BNP (last 3 results) No results for input(s): PROBNP in the last 8760 hours. CBG: Recent Labs  Lab 05/01/18 1754 05/01/18 2050 05/02/18 0809 05/02/18 1236  GLUCAP 132* 195* 138* 183*   D-Dimer: No results for input(s): DDIMER in the last 72 hours. Hgb A1c: No results for input(s): HGBA1C in the last 72 hours. Lipid Profile: No results for input(s): CHOL, HDL, LDLCALC, TRIG, CHOLHDL, LDLDIRECT in the last 72  hours. Thyroid function studies: Recent Labs    05/01/18 1347  TSH 1.735   Anemia work up: No results for input(s): VITAMINB12, FOLATE, FERRITIN, TIBC, IRON, RETICCTPCT in the last 72 hours. Sepsis Labs: Recent Labs  Lab 05/01/18 1027 05/01/18 1347 05/02/18 0547  WBC 6.3  --  6.3  LATICACIDVEN 4.0* 1.8  --     Microbiology Recent Results (from the past 240 hour(s))  Urine culture     Status: None   Collection Time: 05/01/18 12:00 PM  Result Value Ref Range Status   Specimen Description URINE, RANDOM  Final   Special Requests NONE  Final   Culture   Final    NO GROWTH Performed at [facility], [address], [place], [place] [address]    Report Status 05/02/2018 FINAL  Final  Surgical PCR screen     Status: None   Collection Time: 05/02/18 10:28 AM  Result Value Ref Range Status   MRSA, PCR NEGATIVE NEGATIVE Final   Staphylococcus aureus NEGATIVE NEGATIVE Final    Comment: (NOTE) The Xpert SA Assay (FDA approved for NASAL specimens in patients 94 years of age and  older), is one component of a comprehensive surveillance program. It is not intended to diagnose infection nor to guide or monitor treatment. Performed at [facility], [place] [address] [place], [place] [address]     Procedures and diagnostic studies:  Dg Chest 2 View  Result Date: 05/01/2018 CLINICAL DATA:  Syncope.  Bradycardia. EXAM: CHEST - 2 VIEW COMPARISON:  04/09/2018 FINDINGS: The cardiac silhouette remains mildly enlarged. No airspace consolidation, edema, pleural effusion, pneumothorax is identified. Degenerative changes are noted at the shoulders. IMPRESSION: No active cardiopulmonary disease. Electronically Signed   By: Logan Bores M.D.   On: 05/01/2018 13:09    Medications:   . [MAR Hold] budesonide  0.5 mg Inhalation BID  . [MAR Hold] busPIRone  5 mg Oral TID  . [MAR Hold] docusate sodium  100 mg Oral BID  . [MAR Hold] donepezil  10 mg Oral QHS  . [MAR Hold] escitalopram   10 mg Oral QHS  . [MAR Hold] gabapentin  300 mg Oral TID  . gentamicin irrigation  80 mg Irrigation To Cath  . [MAR Hold] insulin aspart  0-9 Units Subcutaneous TID WC  . [MAR Hold] losartan  50 mg Oral Daily  . [MAR Hold] memantine  28 mg Oral Daily  . [MAR Hold] montelukast  10 mg Oral QHS  . [MAR Hold] sodium chloride flush  3 mL Intravenous Q12H  . sodium chloride flush  3 mL Intravenous Q12H   Continuous Infusions: . sodium chloride 50 mL/hr at 05/02/18 1120  . sodium chloride Stopped (05/02/18 1026)  .  ceFAZolin (ANCEF) IV    . lactated ringers 75 mL/hr at 05/02/18 0757     LOS: 0 days   Unnamed Zeien A Eletha Culbertson  [facility]   *Please refer to [email or web address], password [email or web address] to get updated schedule on who will round on this patient, as hospitalists switch teams weekly. If 7PM-7AM, please contact night-coverage at [email or web address], password [email or web address] for any overnight needs.

## 2018-05-02 NOTE — Progress Notes (Signed)
Family called and says pt about to pass out, sb on telemetry, bp 159/82, awake and alert, pt said she had period of dizziness, no brady below 50's on tele, advised Dr Tamala Julian, no new orders

## 2018-05-02 NOTE — Consult Note (Addendum)
Cardiology Consultation:   Patient ID: [name] MRN: [id number]; DOB: [birth year]  Admit date: 05/01/2018 Date of Consult: 05/02/2018  Primary Care Provider: Leeroy Cha, MD Primary Cardiologist: Skeet Latch, MD  Primary Electrophysiologist:  Virl Axe, MD    Patient Profile:   [name] is a 85 y.o. female with a hx of dementia, HTN, DM, colostomy (2/2 SBO/rupture)and bradycardia who is being seen today for the evaluation of bradycardia/near syncope at the request of Dr. Harrell Gave.  History of Present Illness:   [name] was referred to Dr. Caryl Comes out patient back in Oct 2019 with findings by home health caregivers to have pulses as low as 37 (ranging 37-60), with reports of intermittent lightheadedness, in this patient described as being significantly demented with a recent fall.  Symptoms were difficult given her dementia though family had observed her heart rates to get slow and that there has been progressive weakness.  There was discussion about PPM and in d/w the patient's daughter/granddaughter planned for intense observation with the plan that if she were to  Have any interval falls to seek attention at the hospital and would plan for pacing.  She was recently hospitalized with the flu, discharged 04/11/2018, reportedly slow to recover her usual appetite/oral intake since then but otherwise doing OK, admitted yesterday with a syncopal event.  Cardiology was consulted given known hx of bradycardia, EP asked to weigh in on pacing.  She is not on any traditional nodal blocking drugs, is on namenda and aricept though.  LABS K+ 3.5 > 3.8 Mag 1.4  >> replacement ordered BUN/Creat 8/0.85 Trop I: 0.00 (x2) WBC 6.3 H/H 11/35 Plts 181 TSH 1.735  Lactic acid initially 4.0 >> 1.8 without intervention Afebrile  The patient is appropriate though poor historian.  She knows she is at [facility] (worked in environmental here for many years), but can not recall why  she is here.  Her daughter and granddaughter are at bedside.  Her granddaughter was at her side at home when she fainted.  She reports the patient turned to her and said "I'm going to faint" and passed out a few seconds.  She woke after a few seconds, though stayed very weak for a few minutes leaning on her.  She has generalized fatigue, and days that she is more weak then others.  Has had a couple falls in the past but none of late.  The family does feel that the namenda and aricept are helping her dementia significantly    Past Medical History:  Diagnosis Date  . Asthma   . CKD (chronic kidney disease) stage 3, GFR 30-59 ml/min ([place]) 05/01/2018  . Colostomy present ([place])    hx/notes 07/17/2010  . Dementia ([place])   . Diabetic neuropathy ([place])   . Hypertension    hx/notes 07/17/2010  . Pneumonia    recent/notes 10/08/2016  . Symptomatic bradycardia 12/15/2017  . Type II diabetes mellitus ([place])    hx/notes 07/17/2010    Past Surgical History:  Procedure Laterality Date  . ABDOMINAL HYSTERECTOMY    . COLOSTOMY     S/P SBO hx/notes 07/17/2010  . LUMBAR FUSION  11/2006   hx/notes 07/17/2010  . NASAL SINUS SURGERY  04/28/2002   Bilateral inferior turbinate reductions, bilateral maxillary antrotomies, bilateral total ethmoidectomies, bilateral frontal recess explorations, bilateral sphenoidotomies, [employer] guidance./notes 07/28/2010     Home Medications:  Prior to Admission medications   Medication Sig Start Date End Date Taking? Authorizing Provider  acetaminophen (TYLENOL) 325 MG  tablet Take 650 mg by mouth every 6 (six) hours as needed for headache (pain).   Yes [provider]  albuterol (PROVENTIL HFA;VENTOLIN HFA) 108 (90 Base) MCG/ACT inhaler Inhale 1-2 puffs into the lungs every 4 (four) hours as needed for wheezing or shortness of breath.    Yes [provider]  alendronate (FOSAMAX) 70 MG tablet Take 70 mg by mouth every Monday.    Yes [provider]    busPIRone (BUSPAR) 5 MG tablet Take 5 mg by mouth 3 (three) times daily. 11/24/17  Yes [provider]  Calcium Citrate-Vitamin D (CALCIUM CITRATE + D) 315-250 MG-UNIT TABS Take 1 tablet by mouth daily.   Yes [provider]  donepezil (ARICEPT) 10 MG tablet Take 10 mg by mouth at bedtime.  02/24/15  Yes [provider]  escitalopram (LEXAPRO) 10 MG tablet Take 10 mg by mouth at bedtime. 06/16/17  Yes [provider]  FLOVENT HFA 220 MCG/ACT inhaler Inhale 1 puff into the lungs 2 (two) times daily. 05/28/17  Yes [provider]  gabapentin (NEURONTIN) 300 MG capsule Take 300 mg by mouth 3 (three) times daily. 10/12/15  Yes [provider]  losartan (COZAAR) 100 MG tablet Take 0.5 tablets (50 mg total) by mouth daily. 04/11/18  Yes Nita Sells, MD  memantine (NAMENDA XR) 28 MG CP24 24 hr capsule Take 28 mg by mouth daily.   Yes [provider]  montelukast (SINGULAIR) 10 MG tablet Take 10 mg by mouth at bedtime.  06/21/17  Yes [provider]  probenecid (BENEMID) 500 MG tablet Take 250 mg by mouth daily. 10/12/16   [provider]    Inpatient Medications: Scheduled Meds: . budesonide  0.5 mg Inhalation BID  . busPIRone  5 mg Oral TID  . docusate sodium  100 mg Oral BID  . donepezil  10 mg Oral QHS  . escitalopram  10 mg Oral QHS  . gabapentin  300 mg Oral TID  . insulin aspart  0-9 Units Subcutaneous TID WC  . losartan  50 mg Oral Daily  . memantine  28 mg Oral Daily  . montelukast  10 mg Oral QHS  . sodium chloride flush  3 mL Intravenous Q12H   Continuous Infusions: . lactated ringers 75 mL/hr at 05/02/18 0757   PRN Meds: acetaminophen **OR** acetaminophen, albuterol, ondansetron **OR** ondansetron (ZOFRAN) IV  Allergies:    Allergies  Allergen Reactions  . Aspirin Other (See Comments)    Triggers asthma    Social History:   Social History   Socioeconomic History  . Marital status: Single     Spouse name: Not on file  . Number of children: Not on file  . Years of education: Not on file  . Highest education level: Not on file  Occupational History  . Not on file  Social Needs  . Financial resource strain: Not on file  . Food insecurity:    Worry: Not on file    Inability: Not on file  . Transportation needs:    Medical: Not on file    Non-medical: Not on file  Tobacco Use  . Smoking status: Never Smoker  . Smokeless tobacco: Former Systems developer    Types: Chew  Substance and Sexual Activity  . Alcohol use: No  . Drug use: No  . Sexual activity: Never  Lifestyle  . Physical activity:    Days per week: Not on file    Minutes per session: Not on file  .  Stress: Not on file  Relationships  . Social connections:    Talks on phone: Not on file    Gets together: Not on file    Attends religious service: Not on file    Active member of club or organization: Not on file    Attends meetings of clubs or organizations: Not on file    Relationship status: Not on file  . Intimate partner violence:    Fear of current or ex partner: Not on file    Emotionally abused: Not on file    Physically abused: Not on file    Forced sexual activity: Not on file  Other Topics Concern  . Not on file  Social History Narrative  . Not on file    Family History:   Family History  Problem Relation Age of Onset  . Kidney disease Mother   . Hypertension Mother   . Other Father        gangrene with amputation  . Diabetes Sister   . Cancer Brother   . Heart disease Brother   . Schizophrenia Daughter   . Diabetes Daughter   . Stroke Daughter   . Diabetes Sister   . Breast cancer Neg Hx      ROS:  Please see the history of present illness.  All other ROS reviewed and negative.     Physical Exam/Data:   Vitals:   05/02/18 0737 05/02/18 0743 05/02/18 0746 05/02/18 0748  BP:  (!) 157/85 (!) 168/80 (!) 180/80  Pulse:  96 (!) 49 60  Resp:  18    Temp:  98 F (36.7 C)    TempSrc:   Oral    SpO2:  99%    Weight: 67.4 kg     Height:        Intake/Output Summary (Last 24 hours) at 05/02/2018 4665 Last data filed at 05/02/2018 0700 Gross per 24 hour  Intake 2105.04 ml  Output 1250 ml  Net 855.04 ml   Last 3 Weights 05/02/2018 05/01/2018 04/10/2018  Weight (lbs) 148 lb 9.6 oz 151 lb 10.8 oz 151 lb 10.8 oz  Weight (kg) 67.405 kg 68.8 kg 68.8 kg     Body mass index is 27.18 kg/m.  General:  Well nourished, well developed, in no acute distress HEENT: normal Lymph: no adenopathy Neck: no JVD Endocrine:  No thryomegaly Vascular: No carotid bruits  Cardiac:  RRR; bradycardic no murmurs, gallops or rubs Lungs:  CTA b/l, no wheezing, rhonchi or rales  Abd: soft, nontender Ext: no edema Musculoskeletal:  No deformities, age appropriate atrophy Skin: warm and dry  Neuro:  Known dementia, poor historian, no gross  focal abnormalities noted.  She is AAO to self, place Psych:  Normal affect   EKG:  The EKG was personally reviewed and demonstrates:   #1 SB 58, LAD, normal intervals #2 is SR 49, LAD  Telemetry:  Telemetry was personally reviewed and demonstrates:   SB generally 50's, no AV block or marked bradycardic events  Relevant CV Studies:  12/17/17: TTE Study Conclusions - Left ventricle: The cavity size was normal. Systolic function was   vigorous. The estimated ejection fraction was in the range of 65%   to 70%. Wall motion was normal; there were no regional wall   motion abnormalities. Features are consistent with a pseudonormal   left ventricular filling pattern, with concomitant abnormal   relaxation and increased filling pressure (grade 2 diastolic   dysfunction). Doppler parameters are consistent with high  ventricular filling pressure. - Ascending aorta: The ascending aorta was mildly dilated. - Mitral valve: There was mild regurgitation. - Tricuspid valve: There was mild regurgitation. - Pulmonic valve: There was mild regurgitation. - Pulmonary  arteries: PA peak pressure: 42 mm Hg (S). Impressions: - The right ventricular systolic pressure was increased consistent   with moderate pulmonary hypertension.   Laboratory Data:  Chemistry Recent Labs  Lab 05/01/18 1027 05/02/18 0547  NA 138 140  K 3.5 3.8  CL 105 108  CO2 21* 23  GLUCOSE 265* 141*  BUN 8 8  CREATININE 1.04* 0.85  CALCIUM 8.9 8.1*  GFRNONAA 50* >60  GFRAA 58* >60  ANIONGAP 12 9    Recent Labs  Lab 05/01/18 1027  PROT 6.2*  ALBUMIN 2.7*  AST 20  ALT 12  ALKPHOS 68  BILITOT 0.6   Hematology Recent Labs  Lab 05/01/18 1027 05/02/18 0547  WBC 6.3 6.3  RBC 3.90 3.73*  HGB 11.4* 11.0*  HCT 38.4 35.4*  MCV 98.5 94.9  MCH 29.2 29.5  MCHC 29.7* 31.1  RDW 12.7 12.5  PLT 173 181   Cardiac EnzymesNo results for input(s): TROPONINI in the last 168 hours.  Recent Labs  Lab 05/01/18 1031 05/01/18 1350  TROPIPOC 0.00 0.00    BNPNo results for input(s): BNP, PROBNP in the last 168 hours.  DDimer No results for input(s): DDIMER in the last 168 hours.  Radiology/Studies:   Dg Chest 2 View Result Date: 05/01/2018 CLINICAL DATA:  Syncope.  Bradycardia. EXAM: CHEST - 2 VIEW COMPARISON:  04/09/2018 FINDINGS: The cardiac silhouette remains mildly enlarged. No airspace consolidation, edema, pleural effusion, pneumothorax is identified. Degenerative changes are noted at the shoulders. IMPRESSION: No active cardiopulmonary disease. Electronically Signed   By: Logan Bores M.D.   On: 05/01/2018 13:09    Assessment and Plan:   1. Syncope 2. Suspect to be symptomatic bradycardia with known h/o bradycardia for the patient      aricept and namenda are felt to have greatly helped with her dementia by family's observation     The patient is forgetful, though is appropriate with me today, oriented to self and place, and at least acutely to situation when reoriented , and reportedly still able to spend short periods at home alone  I discussed PPM implant  procedure, potential risks and benefits, short term LUE limitations.  Discussed that not all of her generalized fatigue, lack of energy is from her bradycardia most likely and would not expect a significant improvement in this, they state understanding.   Inquire about help at home, I defer this to medicine MD  The patient and family are agreeable to proceed.  For questions or updates, please contact [place] Please consult [email or web address] for contact info under   Signed, Baldwin Jamaica, PA-C  05/02/2018 8:32 AM   EP Attending  Patient seen and examined. Agree with the findings as noted above. The patient presents with a syncopal episode and noted to be bradycardic. She was seen by Dr. Renaldo Reel and told that if she passed out again, in the setting of sinus node dysfunction would likely need a PPM. I have reviewed the indications including the risks/benefits/goals/expectations of PPM insertion with the patient and she wishes to proceed.  Mikle [place]worth.D.

## 2018-05-03 ENCOUNTER — Encounter (HOSPITAL_COMMUNITY): Payer: Self-pay | Admitting: Internal Medicine

## 2018-05-03 ENCOUNTER — Inpatient Hospital Stay (HOSPITAL_COMMUNITY): Payer: Medicare Other

## 2018-05-03 LAB — CBC WITH DIFFERENTIAL/PLATELET
Abs Immature Granulocytes: 0.02 10*3/uL (ref 0.00–0.07)
Basophils Absolute: 0 10*3/uL (ref 0.0–0.1)
Basophils Relative: 0 %
EOS ABS: 0.1 10*3/uL (ref 0.0–0.5)
Eosinophils Relative: 2 %
HCT: 35.7 % — ABNORMAL LOW (ref 36.0–46.0)
Hemoglobin: 11.3 g/dL — ABNORMAL LOW (ref 12.0–15.0)
Immature Granulocytes: 0 %
Lymphocytes Relative: 21 %
Lymphs Abs: 1.7 10*3/uL (ref 0.7–4.0)
MCH: 30.1 pg (ref 26.0–34.0)
MCHC: 31.7 g/dL (ref 30.0–36.0)
MCV: 94.9 fL (ref 80.0–100.0)
Monocytes Absolute: 0.7 10*3/uL (ref 0.1–1.0)
Monocytes Relative: 9 %
Neutro Abs: 5.6 10*3/uL (ref 1.7–7.7)
Neutrophils Relative %: 68 %
Platelets: 190 10*3/uL (ref 150–400)
RBC: 3.76 MIL/uL — ABNORMAL LOW (ref 3.87–5.11)
RDW: 13.2 % (ref 11.5–15.5)
WBC: 8.2 10*3/uL (ref 4.0–10.5)
nRBC: 0 % (ref 0.0–0.2)

## 2018-05-03 LAB — BASIC METABOLIC PANEL
Anion gap: 9 (ref 5–15)
BUN: 13 mg/dL (ref 8–23)
CALCIUM: 8.4 mg/dL — AB (ref 8.9–10.3)
CO2: 24 mmol/L (ref 22–32)
Chloride: 104 mmol/L (ref 98–111)
Creatinine, Ser: 1.16 mg/dL — ABNORMAL HIGH (ref 0.44–1.00)
GFR calc Af Amer: 51 mL/min — ABNORMAL LOW (ref >60)
GFR calc non Af Amer: 44 mL/min — ABNORMAL LOW (ref >60)
Glucose, Bld: 160 mg/dL — ABNORMAL HIGH (ref 70–99)
Potassium: 4 mmol/L (ref 3.5–5.1)
Sodium: 137 mmol/L (ref 135–145)

## 2018-05-03 LAB — MAGNESIUM: Magnesium: 1.9 mg/dL (ref 1.7–2.4)

## 2018-05-03 LAB — GLUCOSE, CAPILLARY
Glucose-Capillary: 148 mg/dL — ABNORMAL HIGH (ref 70–99)
Glucose-Capillary: 150 mg/dL — ABNORMAL HIGH (ref 70–99)

## 2018-05-03 MED ORDER — DOCUSATE SODIUM 100 MG PO CAPS: 100.0000 mg | ORAL_CAPSULE | Freq: Two times a day (BID) | ORAL | 0 refills | Status: DC

## 2018-05-03 MED FILL — Lidocaine HCl Local Inj 1%: INTRAMUSCULAR | Qty: 60 | Status: AC

## 2018-05-03 NOTE — Discharge Summary (Signed)
[facility] Discharge Summary   Patient: [name] DJM:[id number]   PCP: Leeroy Cha, MD DOB: 1933/12/05   Date of admission: 05/01/2018   Date of discharge: 05/03/2018    Discharge Diagnoses:   Principal Problem:   Near syncope Active Problems:   Diabetes mellitus with complication (HCC)   Dementia (HCC)   Essential hypertension   COPD (chronic obstructive pulmonary disease) (HCC)   Symptomatic bradycardia   CKD (chronic kidney disease) stage 3, GFR 30-59 ml/min ([place])   Admitted From: home Disposition:  Home with home health  Recommendations for Outpatient Follow-up:  1. Please follow-up with PCP in 1 week.  Follow-up Information    [place] [facility] Follow up.   Specialty:  Cardiology Why:  05/16/2018 @ 12:00PM )noon), wound check visit Contact information: [address], [address] [facility] [place]       Evans Lance, MD Follow up.   Specialty:  Cardiology Why:  08/02/2018 @ 1:45PM Contact information: [address] [place] [address] [facility] Follow up.   Why:  They will continue to do your home health care at your home Contact information: [place] [address] [phone]        Leeroy Cha, MD. Schedule an appointment as soon as possible for a visit in 1 week.   Specialty:  Internal Medicine Contact information: [address] [place] [address] [phone]          Diet recommendation: cardiac diet  Activity: The patient is advised to gradually reintroduce usual activities.  Discharge Condition: good  Code Status: full code  History of present illness: As per the H and P dictated on admission, "[name] is a 85 y.o. 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  She was sitting at the table waiting on a SW to come visit.  She said she as able to faint and she passed out.  She was still awake and able to talk throughout but her eyes were kind of blurry.  She has been feeling kind of funny for the last week. She saw the doctor Friday,  She isn't feeling too good right now.  She has dementia.  No chest pain.  No other complaints other than fatigue and malaise for the last month.  She has been sleeping on and off recently and going to bed early.   ED Course:   H/o symptomatic bradycardia, almost got a pacer last year with Dr. Caryl Comes.  Decided not to do it.  Came back with syncope with bradycardia in 40-50 range.  Recently admitted for influenza.  Got gentle IVF.  Lactate was 4, not likely sepsis, now 1.8.  Cardiology has seen and requests [facility] admission for syncope.  EP to see in AM."  Hospital Course:  Summary of her active problems in the hospital is as following. Syncope, symptomatic bradycardia Patient presented after having syncopal episode might be related to symptomatic bradycardia.  Heart rates in the 40-50s overnight.  Previously documented in 12/2017 by Dr. Caryl Comes, but family elected for observation at that time.  Patient on medications of Aricept and Namenda which also can cause symptoms.  Dr. Lovena Le cardiology was consulted and recommended pacemaker placement. Underwent pacemaker plantation.  EP evaluated  the patient on the day of the discharge and felt that the patient is stable to be discharged home from their perspective.  Normocytic normochromic anemia: Stable. H&H relatively stable.  Outpatient follow-up with PCP recommended.  Moderate dementia: Stable.  Family reports patient significantly helped by medications. -Continue Aricept and Namenda Concern with pacemaker precaution.  Will provide sling.  Diabetes mellitus type 2: Last hemoglobin A1c noted to be 7.3 on 09/2016.  Patient currently  not on any home medications for diabetes. Outpatient follow-up with PCP recommended.  Essential hypertension: Patient blood pressures elevated up to 180/80. -Continue losartan  Hypomagnesia: Acute.  Initial magnesium noted to be 1.4.  Patient received 2 g of magnesium sulfate IV.  Lactic acidosis: Resolved.  Admission lactic acid level 4, but resolved on recheck.  Suspect from dehydration and/or hypoperfusion.  Renal insufficiency: Resolved.  Patient's admission creatinine noted to be 1.04,, but after IV fluids improved to 0.85 on hospital day 2. -Continue to monitor outpatient  Patient was seen by physical therapy, who recommended home health which was arranged by case manager. On the day of the discharge the patient's vitals were stable , and no other acute medical condition were reported by patient. the patient was felt safe to be discharge at home with home health.  Consultants: cardiology  Procedures: Permanent pacemaker implantation.  DISCHARGE MEDICATION: Allergies as of 05/03/2018      Reactions   Aspirin Other (See Comments)   Triggers asthma      Medication List    STOP taking these medications   probenecid 500 MG tablet Commonly known as:  BENEMID     TAKE these medications   acetaminophen 325 MG tablet Commonly known as:  TYLENOL Take 650 mg by mouth every 6 (six) hours as needed for headache (pain).   albuterol 108 (90 Base) MCG/ACT inhaler Commonly known as:  PROVENTIL HFA;VENTOLIN HFA Inhale 1-2 puffs into the lungs every 4 (four) hours as needed for wheezing or shortness of breath.   alendronate 70 MG tablet Commonly known as:  FOSAMAX Take 70 mg by mouth every Monday.   busPIRone 5 MG tablet Commonly known as:  BUSPAR Take 5 mg by mouth 3 (three) times daily.   CALCIUM CITRATE + D 315-250 MG-UNIT Tabs Generic drug:  Calcium Citrate-Vitamin D Take 1 tablet by mouth daily.   docusate sodium 100 MG capsule Commonly known as:  COLACE Take 1  capsule (100 mg total) by mouth 2 (two) times daily.   donepezil 10 MG tablet Commonly known as:  ARICEPT Take 10 mg by mouth at bedtime.   escitalopram 10 MG tablet Commonly known as:  LEXAPRO Take 10 mg by mouth at bedtime.   FLOVENT HFA 220 MCG/ACT inhaler Generic drug:  fluticasone Inhale 1 puff into the lungs 2 (two) times daily.   gabapentin 300 MG capsule Commonly known as:  NEURONTIN Take 300 mg by mouth 3 (three) times daily.   losartan 100 MG tablet Commonly known as:  COZAAR Take 0.5 tablets (50 mg total) by mouth daily.   montelukast 10 MG tablet Commonly known as:  SINGULAIR Take 10 mg by mouth at bedtime.   NAMENDA XR 28 MG Cp24 24 hr capsule Generic drug:  memantine Take 28 mg by mouth daily.      Allergies  Allergen Reactions  . Aspirin Other (See Comments)    Triggers asthma   Discharge Instructions    Diet - low sodium heart healthy   Complete by:  As directed  Discharge instructions   Complete by:  As directed    It is important that you read the given instructions as well as go over your medication list with RN to help you understand your care after this hospitalization.  Discharge Instructions: Please follow-up with PCP in 1-2 weeks  Please request your primary care physician to go over all Hospital Tests and Procedure/Radiological results at the follow up. Please get all Hospital records sent to your PCP by signing hospital release before you go home.   Do not drive, operating heavy machinery, perform activities at heights, swimming or participation in water activities or provide baby sitting services; until you have been seen by Primary Care Physician or a Neurologist and advised to do so again. Do not take more than prescribed Pain, Sleep and Anxiety Medications. You were cared for by a hospitalist during your hospital stay. If you have any questions about your discharge medications or the care you received while you were in the hospital  after you are discharged, you can call the unit @UNIT @ you were admitted to and ask to speak with the hospitalist on call if the hospitalist that took care of you is not available.  Once you are discharged, your primary care physician will handle any further medical issues. Please note that NO REFILLS for any discharge medications will be authorized once you are discharged, as it is imperative that you return to your primary care physician (or establish a relationship with a primary care physician if you do not have one) for your aftercare needs so that they can reassess your need for medications and monitor your lab values. You Must read complete instructions/literature along with all the possible adverse reactions/side effects for all the Medicines you take and that have been prescribed to you. Take any new Medicines after you have completely understood and accept all the possible adverse reactions/side effects. Wear Seat belts while driving. If you have smoked or chewed Tobacco in the last 2 yrs please stop smoking and/or stop any Recreational drug use.   Increase activity slowly   Complete by:  As directed      Discharge Exam: Filed Weights   05/01/18 1005 05/02/18 0737 05/03/18 0553  Weight: 68.8 kg 67.4 kg 68.6 kg   Vitals:   05/03/18 0918 05/03/18 1126  BP:  (!) 143/73  Pulse:  (!) 59  Resp:  17  Temp:  98 F (36.7 C)  SpO2: 93% 100%   General: Appear in no distress, no Rash; Oral Mucosa moist. Cardiovascular: S1 and S2 Present, no Murmur, no JVD Respiratory: Bilateral Air entry present and Clear to Auscultation, no Crackles, no wheezes Abdomen: Bowel Sound present, Soft and no tenderness Extremities: no Pedal edema, no calf tenderness Neurology: Grossly no focal neuro deficit.  The results of significant diagnostics from this hospitalization (including imaging, microbiology, ancillary and laboratory) are listed below for reference.    Significant Diagnostic Studies: Dg Chest  2 View  Result Date: 05/03/2018 CLINICAL DATA:  Pacemaker placement EXAM: CHEST - 2 VIEW COMPARISON:  Two days ago FINDINGS: The heart size and mediastinal contours are within normal limits. Dual chamber pacer leads at the right atrium and ventricle. Minimal blunting at the costophrenic sulci, likely trace pleural fluid or atelectasis. No pneumothorax. IMPRESSION: Dual-chamber pacer placement without complicating feature. Electronically Signed   By: Monte Fantasia M.D.   On: 05/03/2018 08:38   Dg Chest 2 View  Result Date: 05/01/2018 CLINICAL DATA:  Syncope.  Bradycardia. EXAM: CHEST -  2 VIEW COMPARISON:  04/09/2018 FINDINGS: The cardiac silhouette remains mildly enlarged. No airspace consolidation, edema, pleural effusion, pneumothorax is identified. Degenerative changes are noted at the shoulders. IMPRESSION: No active cardiopulmonary disease. Electronically Signed   By: Logan Bores M.D.   On: 05/01/2018 13:09   Dg Chest 2 View  Result Date: 04/10/2018 CLINICAL DATA:  Cough. EXAM: CHEST - 2 VIEW COMPARISON:  03/28/2018 FINDINGS: The cardiomediastinal contours are unchanged with mild cardiomegaly. Pulmonary vasculature is normal. No consolidation, pleural effusion, or pneumothorax. No acute osseous abnormalities are seen. Chronic degenerative change of both shoulders. Degenerative change in the spine. IMPRESSION: No acute chest findings.  Chronic mild cardiomegaly. Electronically Signed   By: Keith Rake M.D.   On: 04/10/2018 00:16   Ct Head Wo Contrast  Result Date: 04/10/2018 CLINICAL DATA:  Encephalopathy. Generalized weakness. EXAM: CT HEAD WITHOUT CONTRAST TECHNIQUE: Contiguous axial images were obtained from the base of the skull through the vertex without intravenous contrast. COMPARISON:  Head CT 03/28/2018, brain MRI 11/22/2017, additional prior exams. FINDINGS: Brain: Unchanged degree of atrophy and chronic small vessel ischemia no intracranial hemorrhage, mass effect, or midline  shift. No hydrocephalus. Crowding of the basilar cisterns with cerebellar ectopia is chronic. Chronic empty sella no evidence of territorial infarct or acute ischemia. No extra-axial or intracranial fluid collection. Vascular: Atherosclerosis of skullbase vasculature without hyperdense vessel or abnormal calcification. Skull: No fracture or focal lesion. Sinuses/Orbits: Chronic pan paranasal sinus mucosal thickening and prior sinus surgery. Stable chronic calcification in the region of the optic nerves. No acute findings. Other: None. IMPRESSION: 1. No acute intracranial abnormality. 2. Stable atrophy and chronic small vessel ischemia. Electronically Signed   By: Keith Rake M.D.   On: 04/10/2018 00:07    Microbiology: Recent Results (from the past 240 hour(s))  Urine culture     Status: None   Collection Time: 05/01/18 12:00 PM  Result Value Ref Range Status   Specimen Description URINE, RANDOM  Final   Special Requests NONE  Final   Culture   Final    NO GROWTH Performed at [facility], [address] [place], [place] [address]    Report Status 05/02/2018 FINAL  Final  Surgical PCR screen     Status: None   Collection Time: 05/02/18 10:28 AM  Result Value Ref Range Status   MRSA, PCR NEGATIVE NEGATIVE Final   Staphylococcus aureus NEGATIVE NEGATIVE Final    Comment: (NOTE) The Xpert SA Assay (FDA approved for NASAL specimens in patients 43 years of age and older), is one component of a comprehensive surveillance program. It is not intended to diagnose infection nor to guide or monitor treatment. Performed at [facility], [place] [address] [place], [place] [address]      Labs: CBC: Recent Labs  Lab 05/01/18 1027 05/02/18 0547 05/03/18 0510  WBC 6.3 6.3 8.2  NEUTROABS 4.1  --  5.6  HGB 11.4* 11.0* 11.3*  HCT 38.4 35.4* 35.7*  MCV 98.5 94.9 94.9  PLT 173 181 220   Basic Metabolic Panel: Recent Labs  Lab 05/01/18 1027 05/02/18 0547 05/03/18 0510  NA  138 140 137  K 3.5 3.8 4.0  CL 105 108 104  CO2 21* 23 24  GLUCOSE 265* 141* 160*  BUN 8 8 13   CREATININE 1.04* 0.85 1.16*  CALCIUM 8.9 8.1* 8.4*  MG 1.4*  --  1.9   Liver Function Tests: Recent Labs  Lab 05/01/18 1027  AST 20  ALT  12  ALKPHOS 68  BILITOT 0.6  PROT 6.2*  ALBUMIN 2.7*   Recent Labs  Lab 05/01/18 1027  LIPASE 37   No results for input(s): AMMONIA in the last 168 hours. Cardiac Enzymes: No results for input(s): CKTOTAL, CKMB, CKMBINDEX, TROPONINI in the last 168 hours. BNP (last 3 results) Recent Labs    04/10/18 0034  BNP 36.1   CBG: Recent Labs  Lab 05/02/18 1236 05/02/18 1713 05/02/18 2127 05/03/18 0731 05/03/18 1224  GLUCAP 183* 129* 199* 148* 150*   Time spent: 35 minutes  Signed:  Berle Mull  [facility] 05/03/2018

## 2018-05-03 NOTE — Progress Notes (Signed)
Occupational Therapy Evaluation Patient Details Name: [name] MRN: [id number] DOB: 12-23-1933 Today's Date: 05/03/2018    History of Present Illness Patient is an 85 y/o female presenting to hospital with syncope and symptomatic bradycardia. Patient is s/p L pacemaker on 2/17. 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    Clinical Impression   PTA, pt modified independent with mobility and ADL, including emptying her colostomy bag. Pt is not aware that she had a pace maker placed due to her cognitive deficits. Pt is unable to recall any pace maker precautions from earlier PT session. Pt able to complete ADL task with L non-dominant UE within allowable ROM, including emptying her colostomy bag. [place] daughter present and inquiring about SNF. Feel pt is appropriate for DC home with initial S for ADL tasks. Recommend follow up with [facility] Aide to facilitate return to PLOF. Will follow acutely.    Follow Up Recommendations  Home health OT ; [facility] Aide; S with ADL tasks and mobility   Equipment Recommendations  None recommended by OT    Recommendations for Other Services       Precautions / Restrictions Precautions Precautions: Fall;ICD/Pacemaker Precaution Comments: reviewed pacemaker precautions with patient/family Restrictions Weight Bearing Restrictions: Yes LUE Weight Bearing: (limited ROM per pace maker precautions)      Mobility Bed Mobility   General bed mobility comments: OOB in chair  Transfers Overall transfer level: Modified independent Equipment used: Rolling walker (2 wheeled)       General transfer comment: Patient required min guard to stand with use of RW for safety. Verbal cues to not push up using LUE. Verbal cues to rest LUE on RW when in standing.    Balance Overall balance assessment: Needs assistance Sitting-balance support: No upper extremity supported;Feet supported Sitting  balance-Leahy Scale: Good     Standing balance support: Bilateral upper extremity supported Standing balance-Leahy Scale: Fair Standing balance comment: able to release RW for pericare                           ADL either performed or assessed with clinical judgement   ADL Overall ADL's : Needs assistance/impaired                                     Functional mobility during ADLs: Supervision/safety;Rolling walker General ADL Comments: Pt overall set up for ADL; Pt able to ambulate to toilet and complete toileting with S @ RW level. Able to simulate emptying colostomy bag with S keeping LUE within appropriate ROM per pacemaker precautions; [place] daughter educated on donning/doffing sling and the need for pt to wear sling at night. If grand daughter concerned of pt moving arm above head, discussed option of using waist belt of sling. Sling marked "shoulder" and "waist" strap to help grand daughter correctly orient straps and sling. Grand daughter verbalzied understanding. [place] daughter stating that pt can not walk and needs help. [place] daughter then observed pt walking without assistance. Recommend that pt have S for ADL and mobility. [place] daughter concerned about pt using the microwave. Recommend unplugging microwave if concerned.   Attempted to educate on UB dressing however grand daughter had to leave.      Vision         Perception     Praxis  Pertinent Vitals/Pain Pain Assessment: Faces Faces Pain Scale: No hurt     Hand Dominance Right   Extremity/Trunk Assessment Upper Extremity Assessment Upper Extremity Assessment: LUE deficits/detail LUE Deficits / Details: s/p L pacemaker placement. LUE: Unable to fully assess due to immobilization(keep shoulder below 90 flexion and close to body)   Lower Extremity Assessment Lower Extremity Assessment: Defer to PT evaluation   Cervical / Trunk Assessment Cervical / Trunk Assessment: Normal    Communication Communication Communication: No difficulties   Cognition Arousal/Alertness: Awake/alert Behavior During Therapy: WFL for tasks assessed/performed Overall Cognitive Status: History of cognitive impairments - at baseline                                    General Comments  No family present during session. [place] daughter present for session   Exercises     Shoulder Instructions      Home Living Family/patient expects to be discharged to:: Private residence Living Arrangements: Children;Other relatives(reports lives with daughter and grandchildren) Available Help at Discharge: Family;Available 24 hours/day Type of Home: House Home Access: Stairs to enter [employer] of Steps: 3 Entrance Stairs-Rails: Left;Right Home Layout: One level     Bathroom Shower/Tub: Teacher, early years/pre: Standard Bathroom Accessibility: Yes How Accessible: Accessible via walker Home Equipment: Walker - 2 wheels   Additional Comments: Patient with dementia and no caregiver present to determine accuracy of home information      Prior Functioning/Environment Level of Independence: Needs assistance  Gait / Transfers Assistance Needed: ambulates with RW ADL's / Homemaking Assistance Needed: Patient reports independence with bathing and dressing; completes her o            OT Problem List: Decreased safety awareness;Decreased knowledge of precautions      OT Treatment/Interventions: Self-care/ADL training;DME and/or AE instruction;Therapeutic activities;Cognitive remediation/compensation;Patient/family education    OT Goals(Current goals can be found in the care plan section) Acute Rehab OT Goals Patient Stated Goal: per grand daughter "for pt to go somewhere to be watched" OT Goal Formulation: With patient/family Time For Goal Achievement: 05/17/18 Potential to Achieve Goals: Good  OT Frequency: Min 2X/week   Barriers to D/C:             Co-evaluation              AM-PAC OT "6 Clicks" Daily Activity     Outcome Measure Help from another person eating meals?: None Help from another person taking care of personal grooming?: None Help from another person toileting, which includes using toliet, bedpan, or urinal?: A Little Help from another person bathing (including washing, rinsing, drying)?: A Little Help from another person to put on and taking off regular upper body clothing?: A Little Help from another person to put on and taking off regular lower body clothing?: A Little 6 Click Score: 20   End of Session Equipment Utilized During Treatment: Rolling walker;Gait belt Nurse Communication: Mobility status;Other (comment)(DC recommendations)  Activity Tolerance: Patient tolerated treatment well Patient left: in chair;with call bell/phone within reach;with chair alarm set;with family/visitor present  OT Visit Diagnosis: Muscle weakness (generalized) (M62.81);Other symptoms and signs involving cognitive function                Time: 3149-7026 OT Time Calculation (min): 25 min Charges:  OT General Charges $OT Visit: 1 Visit OT Evaluation $OT Eval Moderate Complexity: 1 Mod OT Treatments $Self Care/Home Management :  8-22 mins  Maurie Boettcher, OT/L   Acute OT Clinical Specialist Acute Rehabilitation Services Pager [phone] Office [phone]   [facility] 05/03/2018, 1:11 PM

## 2018-05-03 NOTE — Progress Notes (Signed)
05/03/18 1106  PT Visit Information  Last PT Received On 05/03/18  Assistance Needed +1  History of Present Illness Patient is an 85 y/o female presenting to hospital with syncope and symptomatic bradycardia. Patient is s/p L pacemaker on 2/17. 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   Precautions  Precautions Fall;ICD/Pacemaker  Precaution Comments reviewed pacemaker precautions with patient at beginning of session. Unable to recall precautions at end of session.   Restrictions  Weight Bearing Restrictions Yes  LUE Weight Bearing  (pacemaker precautions)  Home Living  Family/patient expects to be discharged to: Private residence  Living Arrangements Children;Other relatives (reports lives with daughter and grandchildren)  Available Help at Discharge Family;Available 24 hours/day  Type of Home House  Home Access Stairs to enter  Entrance Stairs-Number of Steps 3  Entrance Stairs-Rails Left;Right  Home Layout One level  Bathroom Higher education careers adviser - 2 wheels  Additional Comments Patient with dementia and no caregiver present to determine accuracy of home information  Prior Function  Level of Independence Needs assistance  Gait / Transfers Assistance Needed ambulates with RW  ADL's / Homemaking Assistance Needed Patient reports independence with bathing and dressing   Communication  Communication No difficulties  Pain Assessment  Pain Assessment Faces  Faces Pain Scale 0  Cognition  Arousal/Alertness Awake/alert  Behavior During Therapy WFL for tasks assessed/performed  Overall Cognitive Status History of cognitive impairments - at baseline  General Comments No family present during session. Patient oriented to self and place. Not oriented to year. Unable to recall precautions at end of session.   Upper Extremity Assessment  Upper Extremity  Assessment LUE deficits/detail  LUE Deficits / Details s/p L pacemaker placement  Lower Extremity Assessment  Lower Extremity Assessment Generalized weakness  Cervical / Trunk Assessment  Cervical / Trunk Assessment Normal  Bed Mobility  Overal bed mobility Needs Assistance  Bed Mobility Supine to Sit  Supine to sit Supervision  General bed mobility comments Patient required supervision to sit EOB for safety. Verbal cues to not use LUE during bed mobility.   Transfers  Overall transfer level Needs assistance  Equipment used Rolling walker (2 wheeled)  Transfers Sit to/from Stand  Sit to Stand Min guard  General transfer comment Patient required min guard to stand with use of RW for safety. Verbal cues to not push up using LUE. Verbal cues to rest LUE on RW when in standing.  Ambulation/Gait  Ambulation/Gait assistance Min guard  Gait Distance (Feet) 125 Feet  Assistive device Rolling walker (2 wheeled)  Gait Pattern/deviations Step-through pattern;Decreased step length - right;Decreased step length - left;Decreased stride length  General Gait Details Patient ambulated with min guard and use of RW for safety. Verbal cues for sequencing and proximity to RW. Verbal cues for upright posture during ambulation. 1 LOB noted but with self correction. Patient able to maintain precautions during ambulation.   Gait velocity decreased  Gait velocity interpretation <1.8 ft/sec, indicate of risk for recurrent falls  Balance  Overall balance assessment Needs assistance  Sitting-balance support No upper extremity supported;Feet supported  Sitting balance-Leahy Scale Good  Standing balance support Bilateral upper extremity supported  Standing balance-Leahy Scale Poor  Standing balance comment reliant on UE for support to maintain standing balance  General Comments  General comments (skin integrity, edema, etc.) No family present during session.   PT - End of Session  Equipment Utilized During  Treatment Gait belt  Activity Tolerance Patient tolerated treatment well  Patient left in chair;with call bell/phone within reach;with chair alarm set  Nurse Communication Mobility status  PT Assessment  PT Recommendation/Assessment Patient needs continued PT services  PT Visit Diagnosis Muscle weakness (generalized) (M62.81);Other abnormalities of gait and mobility (R26.89)  PT Problem List Decreased strength;Decreased range of motion;Decreased activity tolerance;Decreased balance;Decreased mobility;Decreased cognition;Decreased knowledge of use of DME;Decreased safety awareness;Decreased knowledge of precautions  PT Plan  PT Frequency (ACUTE ONLY) Min 3X/week  PT Treatment/Interventions (ACUTE ONLY) DME instruction;Gait training;Functional mobility training;Stair training;Therapeutic activities;Therapeutic exercise;Balance training;Patient/family education;Cognitive remediation  AM-PAC PT "6 Clicks" Mobility Outcome Measure (Version 2)  Help needed turning from your back to your side while in a flat bed without using bedrails? 3  Help needed moving from lying on your back to sitting on the side of a flat bed without using bedrails? 3  Help needed moving to and from a bed to a chair (including a wheelchair)? 3  Help needed standing up from a chair using your arms (e.g., wheelchair or bedside chair)? 3  Help needed to walk in hospital room? 3  Help needed climbing 3-5 steps with a railing?  3  6 Click Score 18  Consider Recommendation of Discharge To: Home with [facility]  PT Recommendation  Recommendations for Other Services OT consult  Follow Up Recommendations Home health PT;Supervision/Assistance - 24 hour  PT equipment None recommended by PT  Individuals Consulted  Consulted and Agree with Results and Recommendations Patient  Acute Rehab PT Goals  Patient Stated Goal go home  PT Goal Formulation With patient  Time For Goal Achievement 05/17/18  Potential to Achieve Goals Good  PT Time  Calculation  PT Start Time (ACUTE ONLY) 1043  PT Stop Time (ACUTE ONLY) 1058  PT Time Calculation (min) (ACUTE ONLY) 15 min  PT General Charges  $$ ACUTE PT VISIT 1 Visit  PT Evaluation  $PT Eval Low Complexity 1 Low   Patient admitted to hospital secondary to problems above and with deficits below. Patient required min guard for ambulation with use of RW. Provided frequent education and cues about LUE pacemaker precautions. Patient unable to recall precautions at end of session due to baseline cognitive deficits. Recommending HHPT and 24/7 supervision at this time given functional mobility and cognitive deficits.Patient will benefit from acute physical therapy to maximize independence and safety with functional mobility.   Erick Blinks, SPT

## 2018-05-03 NOTE — Plan of Care (Signed)
  Problem: Safety: Goal: Ability to remain free from injury will improve Outcome: Progressing   

## 2018-05-03 NOTE — Progress Notes (Signed)
Patient is active with [facility] as prior to admission; CM will continue to follow for progression of care; [facility] [phone]

## 2018-05-03 NOTE — Progress Notes (Signed)
12:40 pm TCT [name] grand daughter, all questions answered; patient's daughter is to provide transportation home; Aneta Mins [phone]

## 2018-05-03 NOTE — Progress Notes (Signed)
Progress Note  Patient Name: [name] Date of Encounter: 05/03/2018  Primary Cardiologist: Skeet Latch, MD   Subjective   Feels well, she is observed to ambulate in the room with walker, she denies any CP or SOB  Inpatient Medications    Scheduled Meds: . budesonide  0.5 mg Inhalation BID  . busPIRone  5 mg Oral TID  . docusate sodium  100 mg Oral BID  . donepezil  10 mg Oral QHS  . escitalopram  10 mg Oral QHS  . gabapentin  300 mg Oral TID  . insulin aspart  0-9 Units Subcutaneous TID WC  . losartan  50 mg Oral Daily  . memantine  28 mg Oral Daily  . montelukast  10 mg Oral QHS  . sodium chloride flush  3 mL Intravenous Q12H   Continuous Infusions: .  ceFAZolin (ANCEF) IV 1 g (05/03/18 0452)   PRN Meds: acetaminophen, albuterol, hydrALAZINE, ondansetron **OR** ondansetron (ZOFRAN) IV   Vital Signs    Vitals:   05/02/18 1952 05/02/18 2135 05/03/18 0553 05/03/18 0758  BP: 117/61  (!) 112/59 125/64  Pulse: 78  65 66  Resp: 20  18 16   Temp: 98.9 F (37.2 C)  99.2 F (37.3 C) 98.9 F (37.2 C)  TempSrc: Oral  Oral Oral  SpO2: 97% 95% 99% 94%  Weight:   68.6 kg   Height:        Intake/Output Summary (Last 24 hours) at 05/03/2018 0903 Last data filed at 05/03/2018 0858 Gross per 24 hour  Intake 2845.61 ml  Output 200 ml  Net 2645.61 ml   Last 3 Weights 05/03/2018 05/02/2018 05/01/2018  Weight (lbs) 151 lb 4.8 oz 148 lb 9.6 oz 151 lb 10.8 oz  Weight (kg) 68.629 kg 67.405 kg 68.8 kg      Telemetry    SR - Personally Reviewed  ECG    SR - Personally Reviewed  Physical Exam   GEN: No acute distress.   Neck: No JVD Cardiac: RRR, no murmurs, rubs, or gallops.  Respiratory: Clear to auscultation bilaterally. GI: Soft, nontender, non-distended  MS: No edema; No deformity. Neuro:  Non-focal, she is AAO and oriented to self, place, knows she got the pacer yesterday Psych: Normal affect   PPM site is stable, no bleeding or hematoma  Labs      Chemistry Recent Labs  Lab 05/01/18 1027 05/02/18 0547 05/03/18 0510  NA 138 140 137  K 3.5 3.8 4.0  CL 105 108 104  CO2 21* 23 24  GLUCOSE 265* 141* 160*  BUN 8 8 13   CREATININE 1.04* 0.85 1.16*  CALCIUM 8.9 8.1* 8.4*  PROT 6.2*  --   --   ALBUMIN 2.7*  --   --   AST 20  --   --   ALT 12  --   --   ALKPHOS 68  --   --   BILITOT 0.6  --   --   GFRNONAA 50* >60 44*  GFRAA 58* >60 51*  ANIONGAP 12 9 9      Hematology Recent Labs  Lab 05/01/18 1027 05/02/18 0547 05/03/18 0510  WBC 6.3 6.3 8.2  RBC 3.90 3.73* 3.76*  HGB 11.4* 11.0* 11.3*  HCT 38.4 35.4* 35.7*  MCV 98.5 94.9 94.9  MCH 29.2 29.5 30.1  MCHC 29.7* 31.1 31.7  RDW 12.7 12.5 13.2  PLT 173 181 190    Cardiac EnzymesNo results for input(s): TROPONINI in the last 168 hours.  Recent Labs  Lab 05/01/18 1031 05/01/18 1350  TROPIPOC 0.00 0.00     BNPNo results for input(s): BNP, PROBNP in the last 168 hours.   DDimer No results for input(s): DDIMER in the last 168 hours.   Radiology    Dg Chest 2 View Result Date: 05/03/2018 CLINICAL DATA:  Pacemaker placement EXAM: CHEST - 2 VIEW COMPARISON:  Two days ago FINDINGS: The heart size and mediastinal contours are within normal limits. Dual chamber pacer leads at the right atrium and ventricle. Minimal blunting at the costophrenic sulci, likely trace pleural fluid or atelectasis. No pneumothorax. IMPRESSION: Dual-chamber pacer placement without complicating feature. Electronically Signed   By: Monte Fantasia M.D.   On: 05/03/2018 08:38     Cardiac Studies   12/17/17: TTE Study Conclusions - Left ventricle: The cavity size was normal. Systolic function was vigorous. The estimated ejection fraction was in the range of 65% to 70%. Wall motion was normal; there were no regional wall motion abnormalities. Features are consistent with a pseudonormal left ventricular filling pattern, with concomitant abnormal relaxation and increased filling  pressure (grade 2 diastolic dysfunction). Doppler parameters are consistent with high ventricular filling pressure. - Ascending aorta: The ascending aorta was mildly dilated. - Mitral valve: There was mild regurgitation. - Tricuspid valve: There was mild regurgitation. - Pulmonic valve: There was mild regurgitation. - Pulmonary arteries: PA peak pressure: 42 mm Hg (S). Impressions: - The right ventricular systolic pressure was increased consistent with moderate pulmonary hypertension.   Patient Profile     85 y.o. female with a hx of dementia, HTN, DM, colostomy (2/2 SBO/rupture) and bradycardia, admitted after a breif syncopal event  Assessment & Plan    1. Syncope 2. Suspect to be symptomatic bradycardia with known h/o bradycardia for the patient     Now s/p PPM implant yesterday     Site is stable     Device check this AM with stable measurements/findings     CXR this AM without ptx, stable lead placement     Wound care and activity restrictions were discussed with the patient (will likely need re-enforcement)     Routine post pacer EP follow up is in place  No family at bedside today.  Yesterday we discussed at length expectations post pacer, that likely her baseline level of energy not likely to change significantly if at all, though if her moments of weak spells, and this brief syncopal event were secondary to bradycardia as we suspect, those should not reoccur.    Family inquired yesterday to [place] and staff about getting home help for the patient, I defer this to attending team  EP will sign off, though remain available, please recall if needed Final dose of post-op antibiotic due this AM, can discharge from our perspective once completed        [facility] will sign off.   Medication Recommendations:  None from our perspectve Other recommendations (labs, testing, etc):  none Follow up as an outpatient:  EP follow up is in place  For questions or updates,  please contact [facility] Please consult [email or web address] for contact info under        Signed, Baldwin [place], PA-C  05/03/2018, 9:03 AM

## 2018-05-04 LAB — HEMOGLOBIN A1C
Hgb A1c MFr Bld: 7.9 % — ABNORMAL HIGH (ref 4.8–5.6)
Mean Plasma Glucose: 180 mg/dL

## 2018-05-06 ENCOUNTER — Emergency Department (HOSPITAL_COMMUNITY)
Admission: EM | Admit: 2018-05-06 | Discharge: 2018-05-06 | Disposition: A | Discharge status: Home / Self Care | Payer: Medicare Other | Attending: Emergency Medicine | Admitting: Emergency Medicine

## 2018-05-06 ENCOUNTER — Emergency Department (HOSPITAL_COMMUNITY): Payer: Medicare Other

## 2018-05-06 ENCOUNTER — Encounter (HOSPITAL_COMMUNITY): Payer: Self-pay

## 2018-05-06 DIAGNOSIS — F0390 Unspecified dementia without behavioral disturbance: Secondary | ICD-10-CM | POA: Insufficient documentation

## 2018-05-06 DIAGNOSIS — Z950 Presence of cardiac pacemaker: Secondary | ICD-10-CM | POA: Insufficient documentation

## 2018-05-06 DIAGNOSIS — R55 Syncope and collapse: Secondary | ICD-10-CM | POA: Diagnosis not present

## 2018-05-06 DIAGNOSIS — E1122 Type 2 diabetes mellitus with diabetic chronic kidney disease: Secondary | ICD-10-CM | POA: Diagnosis not present

## 2018-05-06 DIAGNOSIS — J449 Chronic obstructive pulmonary disease, unspecified: Secondary | ICD-10-CM | POA: Diagnosis not present

## 2018-05-06 DIAGNOSIS — N183 Chronic kidney disease, stage 3 (moderate): Secondary | ICD-10-CM | POA: Insufficient documentation

## 2018-05-06 DIAGNOSIS — I129 Hypertensive chronic kidney disease with stage 1 through stage 4 chronic kidney disease, or unspecified chronic kidney disease: Secondary | ICD-10-CM | POA: Diagnosis not present

## 2018-05-06 DIAGNOSIS — Z79899 Other long term (current) drug therapy: Secondary | ICD-10-CM | POA: Insufficient documentation

## 2018-05-06 LAB — CBC WITH DIFFERENTIAL/PLATELET
Abs Immature Granulocytes: 0.03 10*3/uL (ref 0.00–0.07)
Basophils Absolute: 0 10*3/uL (ref 0.0–0.1)
Basophils Relative: 0 %
Eosinophils Absolute: 0.1 10*3/uL (ref 0.0–0.5)
Eosinophils Relative: 2 %
HEMATOCRIT: 37.3 % (ref 36.0–46.0)
Hemoglobin: 12 g/dL (ref 12.0–15.0)
Immature Granulocytes: 1 %
LYMPHS ABS: 1.6 10*3/uL (ref 0.7–4.0)
LYMPHS PCT: 26 %
MCH: 30.3 pg (ref 26.0–34.0)
MCHC: 32.2 g/dL (ref 30.0–36.0)
MCV: 94.2 fL (ref 80.0–100.0)
Monocytes Absolute: 0.6 10*3/uL (ref 0.1–1.0)
Monocytes Relative: 10 %
Neutro Abs: 3.6 10*3/uL (ref 1.7–7.7)
Neutrophils Relative %: 61 %
Platelets: 163 10*3/uL (ref 150–400)
RBC: 3.96 MIL/uL (ref 3.87–5.11)
RDW: 12.9 % (ref 11.5–15.5)
WBC: 5.9 10*3/uL (ref 4.0–10.5)
nRBC: 0 % (ref 0.0–0.2)

## 2018-05-06 LAB — BASIC METABOLIC PANEL
Anion gap: 12 (ref 5–15)
BUN: 9 mg/dL (ref 8–23)
CO2: 22 mmol/L (ref 22–32)
Calcium: 8.7 mg/dL — ABNORMAL LOW (ref 8.9–10.3)
Chloride: 105 mmol/L (ref 98–111)
Creatinine, Ser: 0.87 mg/dL (ref 0.44–1.00)
GFR calc Af Amer: >60 mL/min (ref >60)
GFR calc non Af Amer: >60 mL/min (ref >60)
Glucose, Bld: 144 mg/dL — ABNORMAL HIGH (ref 70–99)
Potassium: 3.9 mmol/L (ref 3.5–5.1)
Sodium: 139 mmol/L (ref 135–145)

## 2018-05-06 LAB — TROPONIN I: Troponin I: <0.03 ng/mL (ref <0.03)

## 2018-05-06 MED ORDER — LOSARTAN POTASSIUM 50 MG PO TABS
50.0000 mg | ORAL_TABLET | Freq: Once | ORAL | Status: AC
  Administered 2018-05-06: 50 mg via ORAL
  Filled 2018-05-06: qty 1

## 2018-05-06 NOTE — ED Notes (Signed)
[facility] called  

## 2018-05-06 NOTE — ED Provider Notes (Signed)
[facility] EMERGENCY DEPARTMENT Provider Note   CSN: [id number] Arrival date & time: 05/06/18  5852    History   Chief Complaint Chief Complaint  Patient presents with  . Loss of Consciousness    HPI [name] is a 85 y.o. female.     HPI Patient is brought by EMS.  Family members are not present to give history.  Patient reports that she thinks she fell or passed out but she does not really remember exactly.  She reports her granddaughter called EMS.  Patient reports that she had gotten up to go to the bathroom and apparently when she came back she was lightheaded.  [name] had reported to EMS that the patient passed out "for 1 second" while sitting on the bed.  EMS note indicates patient hit her head on the dresser.  Patient denies she has any pain.  She denies remembering actually having loss of consciousness.  She denies any blurred vision or double vision.  She denies weakness or numbness into her extremities. Past Medical History:  Diagnosis Date  . Asthma   . CKD (chronic kidney disease) stage 3, GFR 30-59 ml/min ([place]) 05/01/2018  . Colostomy present ([place])    hx/notes 07/17/2010  . Dementia ([place])   . Diabetic neuropathy ([place])   . Hypertension    hx/notes 07/17/2010  . Pneumonia    recent/notes 10/08/2016  . Symptomatic bradycardia 12/15/2017  . Type II diabetes mellitus ([place])    hx/notes 07/17/2010    Patient Active Problem List   Diagnosis Date Noted  . Symptomatic bradycardia 05/01/2018  . Near syncope 05/01/2018  . CKD (chronic kidney disease) stage 3, GFR 30-59 ml/min ([facility]) 05/01/2018  . Influenza B 04/10/2018  . Generalized weakness 04/10/2018  . Essential hypertension 03/01/2018  . COPD (chronic obstructive pulmonary disease) ([place]) 03/01/2018  . Bradycardia 12/15/2017  . Colostomy present ([place]) 11/08/2016  . Syncope 10/08/2016  . Acute kidney injury ([place]) 10/08/2016  . Abnormal chest x-ray 10/08/2016  . Lethargy  10/08/2016  . Dementia ([place])   . Sepsis ([place]) 09/05/2016  . Community acquired pneumonia 09/05/2016  . Toxic metabolic encephalopathy 77/82/4235  . Diabetes mellitus with complication ([place]) 36/14/4315  . Nausea & vomiting 04/09/2012  . Gallstones 03/15/2011  . Partial SBO 03/15/2011  . Colostomy hernia ([place]) 03/15/2011  . PSEUDOGOUT 02/28/2007  . IRON DEFIC ANEMIA [place] DIET IRON INTAKE 02/28/2007  . DISCITIS 02/28/2007  . OSTEOMYELITIS 02/28/2007  . CHILLS WITHOUT FEVER 02/28/2007    Past Surgical History:  Procedure Laterality Date  . ABDOMINAL HYSTERECTOMY    . COLOSTOMY     S/P SBO hx/notes 07/17/2010  . LUMBAR FUSION  11/2006   hx/notes 07/17/2010  . NASAL SINUS SURGERY  04/28/2002   Bilateral inferior turbinate reductions, bilateral maxillary antrotomies, bilateral total ethmoidectomies, bilateral frontal recess explorations, bilateral sphenoidotomies, Instatrac guidance./notes 07/28/2010  . PACEMAKER IMPLANT N/A 05/02/2018   Procedure: PACEMAKER IMPLANT;  Surgeon: Evans Lance, MD;  Location: [facility];  Service: Cardiovascular;  Laterality: N/A;     OB History   No obstetric history on file.      Home Medications    Prior to Admission medications   Medication Sig Start Date End Date Taking? Authorizing Provider  acetaminophen (TYLENOL) 325 MG tablet Take 650 mg by mouth every 6 (six) hours as needed for headache (pain).   Yes [provider]  albuterol (PROVENTIL HFA;VENTOLIN HFA) 108 (90 Base) MCG/ACT inhaler Inhale 1-2 puffs into  the lungs every 4 (four) hours as needed for wheezing or shortness of breath.    Yes [provider]  alendronate (FOSAMAX) 70 MG tablet Take 70 mg by mouth every Saturday.    Yes [provider]  busPIRone (BUSPAR) 5 MG tablet Take 5 mg by mouth 3 (three) times daily. 11/24/17  Yes [provider]  Calcium Citrate-Vitamin D (CALCIUM CITRATE + D) 315-250 MG-UNIT TABS Take 1 tablet by mouth daily.    Yes [provider]  donepezil (ARICEPT) 10 MG tablet Take 10 mg by mouth at bedtime.  02/24/15  Yes [provider]  escitalopram (LEXAPRO) 10 MG tablet Take 10 mg by mouth at bedtime. 06/16/17  Yes [provider]  FLOVENT HFA 220 MCG/ACT inhaler Inhale 1 puff into the lungs 2 (two) times daily. 05/28/17  Yes [provider]  gabapentin (NEURONTIN) 300 MG capsule Take 300 mg by mouth 3 (three) times daily. 10/12/15  Yes [provider]  losartan (COZAAR) 100 MG tablet Take 0.5 tablets (50 mg total) by mouth daily. 04/11/18  Yes Nita Sells, MD  memantine (NAMENDA XR) 28 MG CP24 24 hr capsule Take 28 mg by mouth daily.   Yes [provider]  montelukast (SINGULAIR) 10 MG tablet Take 10 mg by mouth at bedtime.  06/21/17  Yes [provider]  docusate sodium (COLACE) 100 MG capsule Take 1 capsule (100 mg total) by mouth 2 (two) times daily. Patient not taking: Reported on 05/06/2018 05/03/18   Lavina Hamman, MD    Family History Family History  Problem Relation Age of Onset  . Kidney disease Mother   . Hypertension Mother   . Other Father        gangrene with amputation  . Diabetes Sister   . Cancer Brother   . Heart disease Brother   . Schizophrenia Daughter   . Diabetes Daughter   . Stroke Daughter   . Diabetes Sister   . Breast cancer Neg Hx     Social History Social History   Tobacco Use  . Smoking status: Never Smoker  . Smokeless tobacco: Former Systems developer    Types: Chew  Substance Use Topics  . Alcohol use: No  . Drug use: No     Allergies   Aspirin   Review of Systems Review of Systems 10 Systems reviewed and are negative for acute change except as noted in the HPI.  Physical Exam Updated Vital Signs BP (!) 177/73   Pulse (!) 59   Temp 98.2 F (36.8 C) (Oral)   Resp 13   Ht 5\' 2"  (1.575 m)   Wt 68.6 kg   SpO2 97%   BMI 27.67 kg/m   Physical Exam Constitutional:      Comments: Patient is  alert and interactive.  She is in no distress.  No respiratory distress.  HENT:     Head: Normocephalic and atraumatic.     Nose: Nose normal.     Mouth/Throat:     Mouth: Mucous membranes are moist.     Pharynx: Oropharynx is clear.  Eyes:     Extraocular Movements: Extraocular movements intact.     Pupils: Pupils are equal, round, and reactive to light.  Neck:     Musculoskeletal: Neck supple.     Comments: No C-spine tenderness to palpation. Cardiovascular:     Rate and Rhythm: Normal rate and regular rhythm.     Pulses: Normal pulses.     Comments: Patient has  clean dry pacemaker site.  There is no drainage, no swelling and no erythema.  Steri-Strips are still in place.  Monitor shows a paced rhythm at 60 narrow complex. Pulmonary:     Effort: Pulmonary effort is normal.     Breath sounds: Normal breath sounds.  Abdominal:     General: There is no distension.     Palpations: Abdomen is soft.     Tenderness: There is no abdominal tenderness. There is no guarding.  Musculoskeletal: Normal range of motion.        General: No swelling or tenderness.     Comments: Patient has symmetric in bilateral trace to 1+ edema at the ankles and feet.  Calves are soft and nontender.  Skin condition good.  No contusions or deformities.  Skin:    General: Skin is warm and dry.  Neurological:     Comments: Patient is alert and interactive.  She is some recall deficits regarding everything that happened this morning.  She does remember getting up to go to the bathroom.  Her speech is clear with normal content and situationally appropriate.  She follows commands appropriately.  Grip strength 5\5 bilaterally.  Patient can lift each leg independently off of the bed.  Intact dorsiflexion.  Psychiatric:        Mood and Affect: Mood normal.      ED Treatments / Results  Labs (all labs ordered are listed, but only abnormal results are displayed) Labs Reviewed  BASIC METABOLIC PANEL - Abnormal;  Notable for the following components:      Result Value   Glucose, Bld 144 (*)    Calcium 8.7 (*)    All other components within normal limits  TROPONIN I  CBC WITH DIFFERENTIAL/PLATELET  CBC WITH DIFFERENTIAL/PLATELET  CBC WITH DIFFERENTIAL/PLATELET  PROTIME-INR    EKG EKG Interpretation  Date/Time:  Friday May 06 2018 09:19:33 EST Ventricular Rate:  60 PR Interval:    QRS Duration: 96 QT Interval:  422 QTC Calculation: 422 R Axis:   -55 Text Interpretation:  Atrial-paced rhythm Left anterior fascicular block Borderline T abnormalities, inferior leads paced otherwise no chabge from previous Confirmed by Charlesetta Shanks [phone]) on 05/06/2018 9:24:04 AM   Radiology Dg Chest Port 1 View  Result Date: 05/06/2018 CLINICAL DATA:  Syncope today. EXAM: PORTABLE CHEST 1 VIEW COMPARISON:  05/03/2018 FINDINGS: The heart size and pulmonary vascularity are normal. Pacemaker in place. Lungs are clear. No acute bone abnormality. Chronic degenerative changes of both shoulders, right greater than left. IMPRESSION: No acute abnormalities. Electronically Signed   By: Lorriane Shire M.D.   On: 05/06/2018 09:56    Procedures Procedures (including critical care time)  Medications Ordered in ED Medications  losartan (COZAAR) tablet 50 mg (50 mg Oral Given 05/06/18 1205)     Initial Impression / Assessment and Plan / ED Course  I have reviewed the triage vital signs and the nursing notes.  Pertinent labs & imaging results that were available during my care of the patient were reviewed by me and considered in my medical decision making (see chart for details).       Patient is clinically well in appearance.  At this time I do not have suspicion for infectious etiology of the patient's symptoms.  She just recently had pacemaker placed for issues of bradycardia and near syncope.  Pacemaker site is in excellent condition.  Patient is paced at 60.  Patient's blood pressures are somewhat  elevated in the 180s over 80s.  Patient doubted that she had taken her Cozaar today.  1 dose administered in the emergency department.  At this time, patient's electrolytes and blood counts are within normal limits.  I do not suspect a cardiac ischemic event as the etiology for her near syncope.  This has been an ongoing problem and thus the patient's pacemaker was placed.  At this time it appears to be functioning appropriately.  Plan will be for return home and close follow-up with PCP and cardiology.  Instructions included in discharge for very careful transitions from sitting to standing ideally with an assistant but always having walker or cane at side.  Final Clinical Impressions(s) / ED Diagnoses   Final diagnoses:  Near syncope    ED Discharge Orders    None       Charlesetta Shanks, MD 05/06/18 1255

## 2018-05-06 NOTE — ED Notes (Signed)
Spoke w/ pt's daughter and granddaughter regarding pt's visit today and follow up instructions

## 2018-05-06 NOTE — ED Triage Notes (Signed)
Pt from home via ems; per pt's daughter, pt had a syncopal episode this am "for 1 second" while sitting on bed; pt hit head on dresser; pt not c/o pain, does not remember falling; pt not on blood thinners; pt had pacemaker put in this past Monday; pt has no complaints; pt has colostomy bag; hx DM, HTN, asthma, dementia, tremors  166/88 HR 60 RR 16 99% RA CBG 182

## 2018-05-06 NOTE — Discharge Instructions (Addendum)
1.  Your blood pressure is elevated.  Take your Cozaar 50 mg once a day.  Measure your blood pressures at home and keep a journal so that be reviewed with your doctor. 2.  Try to be very slow in making position changes such as going from lying down to sitting to standing.  Make sure you always have something beside you for support such as a walker or cane.  If it anytime, you feel lightheaded you need to sit down and lie flat. 3.  Make an appointment to see your family doctor and your cardiologist for recheck this week.

## 2018-05-09 ENCOUNTER — Ambulatory Visit: Payer: Self-pay | Admitting: Pulmonary Disease

## 2018-05-16 ENCOUNTER — Ambulatory Visit: Payer: Medicare Other

## 2018-05-23 ENCOUNTER — Ambulatory Visit (INDEPENDENT_AMBULATORY_CARE_PROVIDER_SITE_OTHER): Payer: Medicare Other | Admitting: *Deleted

## 2018-05-23 DIAGNOSIS — Z950 Presence of cardiac pacemaker: Secondary | ICD-10-CM

## 2018-05-23 DIAGNOSIS — R001 Bradycardia, unspecified: Secondary | ICD-10-CM

## 2018-05-23 LAB — CUP PACEART INCLINIC DEVICE CHECK
Battery Remaining Longevity: 96 mo
Battery Voltage: 3.05 V
Brady Statistic RA Percent Paced: 68 %
Brady Statistic RV Percent Paced: 0.14 %
Date Time Interrogation Session: 20200309171034
Implantable Lead Implant Date: 20200217
Implantable Lead Implant Date: 20200217
Implantable Lead Location: 753859
Implantable Lead Location: 753860
Implantable Pulse Generator Implant Date: 20200217
Lead Channel Impedance Value: 575 Ohm
Lead Channel Pacing Threshold Amplitude: 0.5 V
Lead Channel Pacing Threshold Amplitude: 0.5 V
Lead Channel Pacing Threshold Pulse Width: 0.5 ms
Lead Channel Pacing Threshold Pulse Width: 0.5 ms
Lead Channel Sensing Intrinsic Amplitude: 1.4 mV
Lead Channel Sensing Intrinsic Amplitude: 12 mV
Lead Channel Setting Pacing Amplitude: 3.5 V
Lead Channel Setting Pacing Amplitude: 3.5 V
Lead Channel Setting Pacing Pulse Width: 0.5 ms
Lead Channel Setting Sensing Sensitivity: 2 mV
MDC IDC MSMT LEADCHNL RA IMPEDANCE VALUE: 475 Ohm
Pulse Gen Model: 2272
Pulse Gen Serial Number: 9107620

## 2018-05-23 NOTE — Progress Notes (Signed)
Device clinic pacemaker wound check. Steri-strips removed. Wound without redness or edema. Incision edges fully approximated and well healed. Small bump noted at left lateral incision corner; no drainage, redness or discoloration, swelling, or device tethering noted, pt denies fever or chills. See photo below. Reviewed with Dr. Gwinda Maine additional recommendations at this time. Pt and family member agree to monitor site for any signs/symptoms of infection and to call if any noted.  Normal device function. Thresholds, sensing, and impedances consistent with implant measurements. Device programmed at 3.5V for extra safety margin until 3 month visit. Histogram distribution appropriate for patient and level of activity. No mode switches or high ventricular rates noted. HVR detection increased to 175bpm and high atrial rate decreased to 170bpm per protocol. Patient educated about wound care, arm mobility, lifting restrictions, and Merlin monitor. New monitor paired and given to patient today. ROV with GT on 08/02/18.  Patient gave verbal permission to include the following photo:

## 2018-05-24 ENCOUNTER — Telehealth: Payer: Self-pay

## 2018-05-24 ENCOUNTER — Encounter: Payer: Self-pay | Admitting: Pulmonary Disease

## 2018-05-24 ENCOUNTER — Ambulatory Visit (INDEPENDENT_AMBULATORY_CARE_PROVIDER_SITE_OTHER): Payer: Medicare Other | Admitting: Pulmonary Disease

## 2018-05-24 VITALS — BP 124/66 | HR 69 | Ht 62.0 in | Wt 149.4 lb

## 2018-05-24 DIAGNOSIS — J4540 Moderate persistent asthma, uncomplicated: Secondary | ICD-10-CM | POA: Diagnosis not present

## 2018-05-24 NOTE — Progress Notes (Signed)
MARELLY WEHRMAN    [id number]    03-29-1933  Primary Care Physician:Varadarajan, Ronie Spies, MD  Referring Physician: Leeroy Cha, MD [address] [place], [place] [address]  Chief complaint: Follow up for asthma  HPI: 85 year old with history of hypertension, asthma, diabetes, allergy, chronic kidney disease, psychogenic seizure, Alzheimer's dementia She has a history of asthma and is maintained on Flovent and albuterol.  She has increasing problems with dyspnea, wheezing, nonproductive cough.  Seen by primary care in March 2019 and given a prednisone taper She has an albuterol inhaler that she is using 4 times a day.  She has seasonal allergies.  Denies any GERD symptoms.  She had a recent admission in early May 2019 for acute renal failure and abnormal movements which were thought to be secondary to conversion disorder, stress.  She has been evaluated by neurology as an outpatient and has a diagnosis of psychogenic seizures.  Pets: No pets Occupation: Retired Exposures: No known exposures, no mold, Jacuzzi, hot tub.  Carpets at home which are kept clean. Smoking history: Never smoker.  Used chewing tobacco, snuff Travel history: No significant travel history Relevant family history: Significant family history of lung disease.  Interim history: Unable to afford Pulmicort nebs.  She is back on Flovent States that dyspnea is stable.  Using albuterol 2-3 times a day.  Outpatient Encounter Medications as of 05/24/2018  Medication Sig  . acetaminophen (TYLENOL) 325 MG tablet Take 650 mg by mouth every 6 (six) hours as needed for headache (pain).  Marland Kitchen albuterol (PROVENTIL HFA;VENTOLIN HFA) 108 (90 Base) MCG/ACT inhaler Inhale 1-2 puffs into the lungs every 4 (four) hours as needed for wheezing or shortness of breath.   Marland Kitchen alendronate (FOSAMAX) 70 MG tablet Take 70 mg by mouth every Saturday.   . busPIRone (BUSPAR) 5 MG tablet Take 5 mg by mouth 3 (three)  times daily.  . Calcium Citrate-Vitamin D (CALCIUM CITRATE + D) 315-250 MG-UNIT TABS Take 1 tablet by mouth daily.  Marland Kitchen docusate sodium (COLACE) 100 MG capsule Take 1 capsule (100 mg total) by mouth 2 (two) times daily.  Marland Kitchen donepezil (ARICEPT) 10 MG tablet Take 10 mg by mouth at bedtime.   Marland Kitchen escitalopram (LEXAPRO) 10 MG tablet Take 10 mg by mouth at bedtime.  Marland Kitchen FLOVENT HFA 220 MCG/ACT inhaler Inhale 1 puff into the lungs 2 (two) times daily.  Marland Kitchen gabapentin (NEURONTIN) 300 MG capsule Take 300 mg by mouth 3 (three) times daily.  Marland Kitchen losartan (COZAAR) 100 MG tablet Take 0.5 tablets (50 mg total) by mouth daily.  . memantine (NAMENDA XR) 28 MG CP24 24 hr capsule Take 28 mg by mouth daily.  . montelukast (SINGULAIR) 10 MG tablet Take 10 mg by mouth at bedtime.    No facility-administered encounter medications on file as of 05/24/2018.    Physical Exam: Blood pressure 124/66, pulse 69, height 5\' 2"  (1.575 m), weight 149 lb 6.4 oz (67.8 kg), SpO2 94 %. Gen:      No acute distress HEENT:  EOMI, sclera anicteric Neck:     No masses; no thyromegaly Lungs:    Clear to auscultation bilaterally; normal respiratory effort CV:         Regular rate and rhythm; no murmurs Abd:      + bowel sounds; soft, non-tender; no palpable masses, no distension Ext:    No edema; adequate peripheral perfusion Skin:  Warm and dry; no rash Neuro: alert and oriented x 3 Psych: normal mood and affect  Data Reviewed: PFT 10/13/2017 FVC 1.34 [95%], FEV1 0.92 [85%], F/F 68, DLCO 74% Moderate obstruction with air trapping.  Positive bronchodilator rest  FENO 08/17/2017-unable to complete.  Chest x-ray 07/16/2017-mild cardiac enlargement.  Degenerative changes in the thoracolumbar spine.  No acute lung abnormality I have reviewed the images personally.  CBC 07/18/2017-WBC 5.5, eos 8%, absolute eosinophil count 440 Blood allergy profile 08/17/2017-IgE 313, RAST panel negative  Assessment:  Moderate persistent asthma Continue  Flovent, albuterol as needed.  Health maintenance Influenza vaccine and pneumonia vaccine declined by the patient as per primary care note.  Plan/Recommendations: - Flovent, albuterol as needed.  Marshell Garfinkel MD [facility] 05/24/2018, 12:10 PM  CC: Leeroy Cha,*

## 2018-05-24 NOTE — Patient Instructions (Addendum)
Glad the breathing is stable Continue Flovent, albuterol as needed.  Follow-up in 6 months.

## 2018-05-24 NOTE — Telephone Encounter (Signed)
Manual transmission received. Normal pacemaker function. No episodes. Presenting rhythm shows Ap/Vs @ ~60bpm. Patient was seen in ED on 2/21 for near syncopal event and was encouraged to f/u with PCP (and our office for wound check).   Spoke with [name]. Advised of normal device function. She reports that the pt's lightheadedness occurs for only seconds at a time. Reminded her to encourage pt to change positions slowly. Encouraged her to contact pt's PCP for any further recommendations. [name] verbalizes understanding and thanked me for my call.

## 2018-05-24 NOTE — Telephone Encounter (Signed)
Pt granddaughter states the pt felt a few seconds of dizziness but felt better afterwards. I help her set up the home monitor and send a manual transmission with it.

## 2018-05-30 ENCOUNTER — Telehealth: Payer: Self-pay | Admitting: Pulmonary Disease

## 2018-05-30 NOTE — Telephone Encounter (Signed)
lmom to call back 

## 2018-05-31 MED ORDER — FLOVENT HFA 220 MCG/ACT IN AERO: 1.0000 | INHALATION_SPRAY | Freq: Two times a day (BID) | RESPIRATORY_TRACT | 2 refills | Status: DC

## 2018-05-31 NOTE — Telephone Encounter (Signed)
Called and spoke with Patients Granddaughter, [name].  [name] stated Patient needed a refill of Flovent sent to [facility].  Flovent refill sent to requested pharmacy.  Nothing further at this time.

## 2018-07-28 ENCOUNTER — Telehealth: Payer: Self-pay | Admitting: Internal Medicine

## 2018-07-28 ENCOUNTER — Telehealth: Payer: Self-pay | Admitting: Pulmonary Disease

## 2018-07-28 NOTE — Telephone Encounter (Signed)
Pt called to return your call. Could someone call her when you get a chance.

## 2018-07-28 NOTE — Telephone Encounter (Signed)
LMOVM for pt to call the device clinic back about the issues the pt is having with her ppm site.

## 2018-07-28 NOTE — Telephone Encounter (Signed)
ATC [name], [name].  Left message to call back for Brian's recommendations.

## 2018-07-28 NOTE — Telephone Encounter (Signed)
Please make sure the patient or the granddaughter is set up with my chart.  Schedule the patient for a video visit or offer in office visit in 2 weeks to ensure flare is resolved.  Can discuss changing inhalers at that time.  Can offer course of prednisone at this time:  Prednisone 10mg  tablet  >>>4 tabs for 2 days, then 3 tabs for 2 days, 2 tabs for 2 days, then 1 tab for 2 days, then stop >>>take with food  >>>take in the morning   Please place the order  If symptoms worsen patient will need an office visit or will need to present to an emergency room for further evaluation.  Continue to monitor patients temperatures at home.  Wyn Quaker, FNP

## 2018-07-28 NOTE — Telephone Encounter (Signed)
Attempted to reach patient--no answer, phone rang continuously.  Spoke with [name]. Soreness at [facility] site has been going on for about a week. No drainage, redness, swelling, fever, or chills. She reports incision appears well healed. Maybe a little sore to the touch. Soreness seems to come and go. No recent trauma to site. Offered DC appointment next week, but [name] reports neither she nor the pt drives, so it would be difficult to come in. Advised to continue to monitor over the weekend and if soreness doesn't improve, we will try to plan for a DC appointment. [name] took down DC phone number and is aware to call us directly if symptoms do not improve.   [name] reports pt is also experiencing neck soreness since she woke up from her nap. Advised not likely PPM-related and advised to call PCP for recommendations regarding neck pain. [name] verbalizes understanding of all instructions and thanked me for my call.

## 2018-07-28 NOTE — Telephone Encounter (Signed)
Called patient's granddaughter and the call went straight to VM.   Left message for her to call back.

## 2018-07-28 NOTE — Telephone Encounter (Signed)
New Message    Pt says it is sore where she has pacemaker. She has been taking tylenol but the pain still isent going away. She is having some numbness in her hands but she doesn't know if it is related to this or not    Please call back

## 2018-07-28 NOTE — Telephone Encounter (Signed)
Primary Pulmonologist: Dr Vaughan Browner Last office visit and with whom: 05/24/18, Dr Vaughan Browner What do we see them for (pulmonary problems):Asthma Last OV assessment/plan:  Instructions   Glad the breathing is stable Continue Flovent, albuterol as needed.  Follow-up in 6 months.       Was appointment offered to patient (explain)? Yes, [name] stated they are keeping her at home due to [facility] pandemic. Patient has not left the house.   Reason for call:  Called and spoke with [name], [name].  [name] stated Patient is having asthma flare.  Patient is having increased wheezing, some SHOB, and non productive cough. [name] stated this started 1 week ago.  Patient has been using her Flovent and Albuterol inhalers with little relief. [name] stated she feels like she needs another inhaler or something more. [name] stated Patient has not been around anyone sick, has had no fever, no chills.   [name] stated preferred pharmacy is [facility] [address].  Message routed to Bland, NP (App of the day)

## 2018-07-29 NOTE — Telephone Encounter (Signed)
Called granddaughter again and left VM for her call back.

## 2018-08-01 ENCOUNTER — Other Ambulatory Visit: Payer: Self-pay

## 2018-08-01 ENCOUNTER — Ambulatory Visit (INDEPENDENT_AMBULATORY_CARE_PROVIDER_SITE_OTHER): Payer: Medicare Other | Admitting: Primary Care

## 2018-08-01 ENCOUNTER — Encounter: Payer: Self-pay | Admitting: Primary Care

## 2018-08-01 DIAGNOSIS — J4541 Moderate persistent asthma with (acute) exacerbation: Secondary | ICD-10-CM | POA: Diagnosis not present

## 2018-08-01 MED ORDER — PREDNISONE 10 MG PO TABS: ORAL_TABLET | ORAL | 0 refills | Status: DC

## 2018-08-01 NOTE — Telephone Encounter (Signed)
Schedule for a tele visit with Derl Barrow this afternoon.Thanks

## 2018-08-01 NOTE — Patient Instructions (Addendum)
Rx: Prednisone taper (40mg  x 2 days; 30mg  x 2 days; 20mg  x 2 days; 10mg  x 2 days)  Asthma: - Continue Flovent 1 puff twice daily  - Continue Albuterol 2 puffs every 4-6 hours as needed for breakthrough shortness of breath/wheezing - Continue Singulair 10mg  at bedtime   Follow-up  6 months with Dr. Vaughan Browner or sooner if needed    Asthma Attack Prevention, Adult Although you may not be able to control the fact that you have asthma, you can take actions to prevent episodes of asthma (asthma attacks). These actions include:  Creating a written plan for managing and treating your asthma attacks (asthma action plan).  Monitoring your asthma.  Avoiding things that can irritate your airways or make your asthma symptoms worse (asthma triggers).  Taking your medicines as directed.  Acting quickly if you have signs or symptoms of an asthma attack. What are some ways to prevent an asthma attack? Create a plan Work with your health care provider to create an asthma action plan. This plan should include:  A list of your asthma triggers and how to avoid them.  A list of symptoms that you experience during an asthma attack.  Information about when to take medicine and how much medicine to take.  Information to help you understand your peak flow measurements.  Contact information for your health care providers.  Daily actions that you can take to control asthma. Monitor your asthma To monitor your asthma:  Use your peak flow meter every morning and every evening for 2-3 weeks. Record the results in a journal. A drop in your peak flow numbers on one or more days may mean that you are starting to have an asthma attack, even if you are not having symptoms.  When you have asthma symptoms, write them down in a journal.  Avoid asthma triggers Work with your health care provider to find out what your asthma triggers are. This can be done by:  Being tested for allergies.  Keeping a journal  that notes when asthma attacks occur and what may have contributed to them.  Asking your health care provider whether other medical conditions make your asthma worse. Common asthma triggers include:  Dust.  Smoke. This includes campfire smoke and secondhand smoke from tobacco products.  Pet dander.  Trees, grasses or pollens.  Very cold, dry, or humid air.  Mold.  Foods that contain high amounts of sulfites.  Strong smells.  Engine exhaust and air pollution.    Aerosol sprays and fumes from household cleaners.  Household pests and their droppings, including dust mites and cockroaches.  Certain medicines, including NSAIDs. Once you have determined your asthma triggers, take steps to avoid them. Depending on your triggers, you may be able to reduce the chance of an asthma attack by:  Keeping your home clean. Have someone dust and vacuum your home for you 1 or 2 times a week. If possible, have them use a high-efficiency particulate arrestance (HEPA) vacuum.  Washing your sheets weekly in hot water.  Using allergy-proof mattress covers and casings on your bed.  Keeping pets out of your home.  Taking care of mold and water problems in your home.  Avoiding areas where people smoke.  Avoiding using strong perfumes or odor sprays.  Avoid spending a lot of time outdoors when pollen counts are high and on very windy days.  Talking with your health care provider before stopping or starting any new medicines. Medicines Take over-the-counter and prescription medicines  only as told by your health care provider. Many asthma attacks can be prevented by carefully following your medicine schedule. Taking your medicines correctly is especially important when you cannot avoid certain asthma triggers. Even if you are doing well, do not stop taking your medicine and do not take less medicine. Act quickly If an asthma attack happens, acting quickly can decrease how severe it is and how  long it lasts. Take these actions:  Pay attention to your symptoms. If you are coughing, wheezing, or having difficulty breathing, do not wait to see if your symptoms go away on their own. Follow your asthma action plan.  If you have followed your asthma action plan and your symptoms are not improving, call your health care provider or seek immediate medical care at the nearest hospital. It is important to write down how often you need to use your fast-acting rescue inhaler. You can track how often you use an inhaler in your journal. If you are using your rescue inhaler more often, it may mean that your asthma is not under control. Adjusting your asthma treatment plan may help you to prevent future asthma attacks and help you to gain better control of your condition. How can I prevent an asthma attack when I exercise? Exercise is a common asthma trigger. To prevent asthma attacks during exercise:  Follow advice from your health care provider about whether you should use your fast-acting inhaler before exercising. Many people with asthma experience exercise-induced bronchoconstriction (EIB). This condition often worsens during vigorous exercise in cold, humid, or dry environments. Usually, people with EIB can stay very active by using a fast-acting inhaler before exercising.  Avoid exercising outdoors in very cold or humid weather.  Avoid exercising outdoors when pollen counts are high.  Warm up and cool down when exercising.  Stop exercising right away if asthma symptoms start. Consider taking part in exercises that are less likely to cause asthma symptoms such as:  Indoor swimming.  Biking.  Walking.  Hiking.  Playing football. This information is not intended to replace advice given to you by your health care provider. Make sure you discuss any questions you have with your health care provider. Document Released: 02/18/2009 Document Revised: 11/01/2015 Document Reviewed: 08/17/2015  Elsevier Interactive Patient Education  2019 [employer].

## 2018-08-01 NOTE — Telephone Encounter (Signed)
Called pt and spoke with granddaughter [name] letting her know that we needed to schedule pt for televisit to further evaluate symptoms. [name] expressed understanding. Pt has been scheduled for televisit with Derl Barrow, NP to day at 2pm. Nothing further needed.

## 2018-08-01 NOTE — Progress Notes (Signed)
Virtual Visit via Telephone Note  I connected with [name] on 08/01/18 at  2:00 PM EDT by telephone and verified that I am speaking with the correct person using two identifiers.  Location: Patient: Home Provider: Office   I discussed the limitations, risks, security and privacy concerns of performing an evaluation and management service by telephone and the availability of in person appointments. I also discussed with the patient that there may be a patient responsible charge related to this service. The patient expressed understanding and agreed to proceed.   History of Present Illness: 85 year old female, never smoked. PMH significant for moderate persistent asthma, CAP, HTN, diabetes mellitus, psychogenic seizure, Alzheimer's dementia, CKD stage 3. Patient of Dr. Vaughan Browner, last seen on 05/24/18. Maintained on Flovent.   08/01/2018 Called today for acute visit, complains of worsening asthma x 5 days. Associated wheezing and congestion. Cough is non-productive. Requiring Albuterol rescue inhaler twice daily with improvement. No fever, flu-like illness.     Observations/Objective:  - No significant shortness of breath or wheezing observed during phone conversation  Assessment and Plan:   Asthma exacerbation - Continue Flovent 1 puff twice daily - Continue Albuterol 2 puffs every 4-6 hours as needed for breakthrough shortness of breath/wheezing - Continue Singulair 10mg  at bedtime  - RX prednisone taper (40mg  x 2 days; 30mg  x 2 days; 20mg  x 2 days; 10mg  x 2 days)  Follow Up Instructions:  - 6 months or sooner if needed   I discussed the assessment and treatment plan with the patient. The patient was provided an opportunity to ask questions and all were answered. The patient agreed with the plan and demonstrated an understanding of the instructions.   The patient was advised to call back or seek an in-person evaluation if the symptoms worsen or if the condition fails to improve  as anticipated.  I provided 15 minutes of non-face-to-face time during this encounter.   Martyn Ehrich, NP

## 2018-08-01 NOTE — Telephone Encounter (Signed)
Primary Pulmonologist: Dr. Vaughan Browner Last office visit and with whom: 05/24/2018 with Dr. Vaughan Browner What do we see them for (pulmonary problems): asthma Last OV assessment/plan: Instructions   Glad the breathing is stable Continue Flovent, albuterol as needed.  Follow-up in 6 months.  Assessment:  Moderate persistent asthma Continue Flovent, albuterol as needed.      Was appointment offered to patient (explain)?  Pt and granddaughter [name] want recommendations and pt requesting prednisone Rx to be called in   Reason for call: Called and spoke with pt who stated she has had a flare up with her asthma x5 days now. Pt has had to use rescue inhaler twice daily to help with her symptoms. Pt has had complaints of wheezing and congestion.  Pt is taking singulair as prescribed.  Pt stated that the rescue inhaler has helped with her symptoms but since she has had a flare up and symptoms x5 days now, pt is requesting a prednisone Rx to be called into pharmacy for her. When pt has to use her rescue inhaler, she states that it does help with her symptoms but since they keep coming back, that is why she is wanting to know if prednisone can be prescribed.  Pt denies any complaints of fever and has not been around anyone that has been sick. Sarah, please advise on this for pt. Thanks!  (examples of things to ask: : When did symptoms start? Fever? Cough? Productive? Color to sputum? More sputum than usual? Wheezing? Have you needed increased oxygen? Are you taking your respiratory medications? What over the counter measures have you tried?)

## 2018-08-01 NOTE — Telephone Encounter (Signed)
Pt granddaughter is returning call. Cb is [phone].

## 2018-08-02 ENCOUNTER — Encounter: Payer: Self-pay | Admitting: Internal Medicine

## 2018-08-03 ENCOUNTER — Ambulatory Visit (INDEPENDENT_AMBULATORY_CARE_PROVIDER_SITE_OTHER): Payer: Medicare Other | Admitting: *Deleted

## 2018-08-03 DIAGNOSIS — R001 Bradycardia, unspecified: Secondary | ICD-10-CM | POA: Diagnosis not present

## 2018-08-03 LAB — CUP PACEART REMOTE DEVICE CHECK
Date Time Interrogation Session: 20200520170419
Implantable Lead Implant Date: 20200217
Implantable Lead Implant Date: 20200217
Implantable Lead Location: 753859
Implantable Lead Location: 753860
Implantable Pulse Generator Implant Date: 20200217
Pulse Gen Model: 2272
Pulse Gen Serial Number: 9107620

## 2018-08-04 NOTE — Telephone Encounter (Signed)
LMOVM for [name] requesting call back if pt still has any concerns with PPM site. Hundred phone number for questions/concerns.

## 2018-08-12 NOTE — Progress Notes (Signed)
Remote pacemaker transmission.   

## 2018-08-30 ENCOUNTER — Telehealth: Payer: Self-pay

## 2018-08-30 NOTE — Telephone Encounter (Signed)

## 2018-09-06 ENCOUNTER — Encounter: Payer: Medicare Other | Admitting: Internal Medicine

## 2018-10-06 ENCOUNTER — Telehealth: Payer: Self-pay | Admitting: Physician Assistant

## 2018-10-06 NOTE — Telephone Encounter (Signed)
Page received from service.  I returned call and got voicemail. I left a message stating that if the patient is dizzy with feelings of pre-syncope, please send a manual transmission for her device. I advised to call back if they still needed assistance.   Ledora Bottcher, PA-C 10/06/2018, 6:14 PM [place] [address] [place], [place] [address]

## 2018-10-06 NOTE — Telephone Encounter (Signed)
Received second call from answering service.   I was told to return call to [phone]. I again called, and again left a voicemail.   Ledora Bottcher, PA-C 10/06/2018, 6:52 PM [place] [address] [place], [place] [address]

## 2018-10-06 NOTE — Telephone Encounter (Signed)
Page received from service.   I have attempted to call all of the numbers listed in the patient's chart. I also returned call to the phone number given by the answering service [phone]). I left another voicemail.   Tami Lin Doral Digangi, PA-C 10/06/2018, 7:25 PM

## 2018-10-10 ENCOUNTER — Other Ambulatory Visit: Payer: Self-pay

## 2018-10-10 NOTE — Patient Outreach (Signed)
[facility]) Care Management  10/10/2018  [name] 1933-12-09 [id number]  TELEPHONE SCREENING Referral date: 10/05/18 Referral source: primary MD Referral reason: diabetes management Insurance: [employer] care  Received call from [place] with Dr. Quentin Cornwall office confirming receipt of referral for patient to [facility] care management.  Rose states she also wanted to confirm [facility] care management has name and contact phone number for patients granddaughter/ caregiver Laurian Edrington.  RNCM confirmed with Rose the referral was received and verified granddaughter's contact phone number.   Telephone call to patient granddaughter/ designated party release Eather Colas regarding primary MD referral. HIPAA verified by granddaughter for patient.  RNCM introduced herself and explained reason for call. Granddaughter states patient is weak and its hard for patient to get up and move around much. Granddaughter states patient uses a walker to ambulate. She reports patient sits for long periods of time during the day and will not move around much due to complaints of weakness and feeling tired. Granddaughter states patient sleeps a lot.  She states this may be due to her dementia.  Granddaughter states she has mentioned this several times to patients primary MD.  Granddaughter states patient needs help around the house with fixing things such as toilet or sink.  Granddaughter states patient has dementia.  She states patient has complained of feeling dizzy, weak and feeling faint. Granddaughter states patient's doctor has been made aware of this. Granddaughter states 911 has been called on a few occasions due to patient feeling weak, dizzy, and fainting. Granddaughter states patient refused to go to the hospital. She reports patients last follow up with her primary MD was 09/23/18.   RNCM inquired last follow up visit for patient within her cardiologist. Granddaughter states patient's last cardiology  visit follow up was approximately 3 months ago when patient had pacemaker placement. Granddaughter states she is not able to get patient to her follow up visits like she could in the past. Granddaughter states she is personally dealing with her own medical issues and patients daughter, her mother is also dealing with medical issues and is not able to get patient to her appointments.  Granddaughter states patient has had elevated blood sugars in the 200-300 range. She states patient is currently on metformin for management.  Granddaughter states patient does not have a glucometer. Granddaughter states she would not know how to check patients blood sugar if she had a glucometer. RNCM discussed and offered [facility] care management services. Granddaughter agreed to follow up with RN health coach for diabetes education/ management and follow up with Education officer, museum for transportation assistance/ community resources. RNCM informed granddaughter she would call patients primary MD to request referral to home health for physical therapy evaluation/ home health aid/ nursing referral for diabetes/ glucometer education.  RNCM called patients primary MD office and left voice mail message  with Kalman Shan, Dr. Quentin Cornwall medical assistance regarding granddaughter's request for patient for home health physical therapy and home health aid. RNCM requested home health nurse referral to educate patients caregiver on glucometer usage/ assessment. RNCM requested physician order for glucometer for patient.  RNCM informed Rose via voice message that patient would be referred to [facility] RN health coach for ongoing diabetes education /management with caregiver and social work referral for transportation assistance/ community resources. RNCM received return call from [facility] with Dr. Quentin Cornwall office. Discussed home health request per patients granddaughter and referral to [facility] care management for [facility] health coach and social worker. RNCM requested home  health  order due to patients status change of elevated blood sugars/ compliance and patients reported symptoms of weakness and inability to get up from sitting positions and compromised ambulation.  Rose states she is unclear what [facility] care management does.  RNCM explained services of nursing/ case management, Education officer, museum, and pharmacy.  RNCM offered to speak with Dr. Fara Olden regarding patients status if needed.   ASSESSMENT;  Patient would benefit from referral to home health for nursing/ physical therapy evaluation and home health aid assistance.  Patient will benefit from [facility] care management referral to RN health coach and social worker.   PLAN; RNCM will refer patient to [facility] care management social worker for transportation assistance/ community resources and RN  health coach for diabetes education / management.   Quinn Plowman RN,BSN,CCM [facility] Telephonic  [phone]           PLAN:

## 2018-10-11 ENCOUNTER — Other Ambulatory Visit: Payer: Self-pay

## 2018-10-11 ENCOUNTER — Emergency Department (HOSPITAL_COMMUNITY)
Admission: EM | Admit: 2018-10-11 | Discharge: 2018-10-12 | Disposition: A | Discharge status: Home / Self Care | Payer: Medicare Other | Attending: Emergency Medicine | Admitting: Emergency Medicine

## 2018-10-11 ENCOUNTER — Emergency Department (HOSPITAL_COMMUNITY): Payer: Medicare Other

## 2018-10-11 ENCOUNTER — Other Ambulatory Visit: Payer: Self-pay | Admitting: *Deleted

## 2018-10-11 DIAGNOSIS — E1122 Type 2 diabetes mellitus with diabetic chronic kidney disease: Secondary | ICD-10-CM | POA: Insufficient documentation

## 2018-10-11 DIAGNOSIS — Z950 Presence of cardiac pacemaker: Secondary | ICD-10-CM | POA: Diagnosis not present

## 2018-10-11 DIAGNOSIS — F0390 Unspecified dementia without behavioral disturbance: Secondary | ICD-10-CM | POA: Insufficient documentation

## 2018-10-11 DIAGNOSIS — N183 Chronic kidney disease, stage 3 (moderate): Secondary | ICD-10-CM | POA: Diagnosis not present

## 2018-10-11 DIAGNOSIS — Z79899 Other long term (current) drug therapy: Secondary | ICD-10-CM | POA: Diagnosis not present

## 2018-10-11 DIAGNOSIS — I129 Hypertensive chronic kidney disease with stage 1 through stage 4 chronic kidney disease, or unspecified chronic kidney disease: Secondary | ICD-10-CM | POA: Insufficient documentation

## 2018-10-11 DIAGNOSIS — Z87891 Personal history of nicotine dependence: Secondary | ICD-10-CM | POA: Insufficient documentation

## 2018-10-11 DIAGNOSIS — E860 Dehydration: Secondary | ICD-10-CM | POA: Insufficient documentation

## 2018-10-11 DIAGNOSIS — R42 Dizziness and giddiness: Secondary | ICD-10-CM

## 2018-10-11 LAB — DIFFERENTIAL
Abs Immature Granulocytes: 0.02 10*3/uL (ref 0.00–0.07)
Basophils Absolute: 0.1 10*3/uL (ref 0.0–0.1)
Basophils Relative: 1 %
Eosinophils Absolute: 0.5 10*3/uL (ref 0.0–0.5)
Eosinophils Relative: 8 %
Immature Granulocytes: 0 %
Lymphocytes Relative: 35 %
Lymphs Abs: 2.1 10*3/uL (ref 0.7–4.0)
Monocytes Absolute: 0.6 10*3/uL (ref 0.1–1.0)
Monocytes Relative: 10 %
Neutro Abs: 2.7 10*3/uL (ref 1.7–7.7)
Neutrophils Relative %: 46 %

## 2018-10-11 LAB — PROTIME-INR
INR: 1 (ref 0.8–1.2)
Prothrombin Time: 12.7 seconds (ref 11.4–15.2)

## 2018-10-11 LAB — CBC
HCT: 41.1 % (ref 36.0–46.0)
Hemoglobin: 13 g/dL (ref 12.0–15.0)
MCH: 30.4 pg (ref 26.0–34.0)
MCHC: 31.6 g/dL (ref 30.0–36.0)
MCV: 96.3 fL (ref 80.0–100.0)
Platelets: 255 10*3/uL (ref 150–400)
RBC: 4.27 MIL/uL (ref 3.87–5.11)
RDW: 13.1 % (ref 11.5–15.5)
WBC: 5.9 10*3/uL (ref 4.0–10.5)
nRBC: 0 % (ref 0.0–0.2)

## 2018-10-11 LAB — COMPREHENSIVE METABOLIC PANEL
ALT: 14 U/L (ref 0–44)
AST: 25 U/L (ref 15–41)
Albumin: 3.6 g/dL (ref 3.5–5.0)
Alkaline Phosphatase: 78 U/L (ref 38–126)
Anion gap: 12 (ref 5–15)
BUN: 24 mg/dL — ABNORMAL HIGH (ref 8–23)
CO2: 22 mmol/L (ref 22–32)
Calcium: 9.3 mg/dL (ref 8.9–10.3)
Chloride: 106 mmol/L (ref 98–111)
Creatinine, Ser: 1.42 mg/dL — ABNORMAL HIGH (ref 0.44–1.00)
GFR calc Af Amer: 39 mL/min — ABNORMAL LOW (ref >60)
GFR calc non Af Amer: 34 mL/min — ABNORMAL LOW (ref >60)
Glucose, Bld: 121 mg/dL — ABNORMAL HIGH (ref 70–99)
Potassium: 4.3 mmol/L (ref 3.5–5.1)
Sodium: 140 mmol/L (ref 135–145)
Total Bilirubin: 0.3 mg/dL (ref 0.3–1.2)
Total Protein: 6.9 g/dL (ref 6.5–8.1)

## 2018-10-11 LAB — APTT: aPTT: 32 seconds (ref 24–36)

## 2018-10-11 MED ORDER — SODIUM CHLORIDE 0.9% FLUSH
3.0000 mL | Freq: Once | INTRAVENOUS | Status: DC
Start: 2018-10-11 — End: 2018-10-12

## 2018-10-11 NOTE — Patient Outreach (Signed)
[facility]) Care Management  10/11/2018  [name] 03-02-1934 [id number]   Social work referral received from [employer].  "Referral to social worker for transportation assistance and community resources. Patient was referred by her primary MD. Patient's daughter / designated party release is Medical illustrator. Please contact her at [phone]. Patient has dementia per physician documentation. Granddaughter provided transportation and assistance to patients doctors appointments but is unable to do so now due to her own personal medical problems. Granddaughter also states patient's daughter is unable to take patient to her appointments due to Having medical problems as well. Patient needs to be accompanied to doctor appointments due to dementia state. Patient is able to ambulate with walker per granddaughter. Granddaughter also requesting community resource assistance for patient with home repairs. Please see RNCM note for additional information. " Successful outreach to granddaughter today.  She and BSW discussed options for transportation through [facility] and [employer], however, BSW informed her that someone would still have to accompany patient to appointments as this is not part of service.  Patient does not have a ramp at the home and granddaughter reports that she needs assistance getting down stairs; other people living in the home are unable to assist with this due to health issues of their own.  BSW informed patient that, for liability reasons, [employer] transportation staff are not able to provide "hands on" assistance.  I do believe that [facility] will also require a ramp in order for her to be eligible for services.  BSW will inquire with [employer] eligibility about this and call granddaughter back.  BSW informed her that [place] may be able to assist with building of ramp. Referral to be submitted.  Granddaughter inquired about resources for assistance with minor home  repairs.  BSW provided her with contact information for [facility] Program through [place].   BSW will follow up when response is received about [employer] eligibility   [name], [place] Worker [phone]

## 2018-10-11 NOTE — ED Notes (Signed)
Patient grandaughter Alverda Skeans asking for call back [phone]  Please call when can

## 2018-10-11 NOTE — ED Triage Notes (Signed)
Per [facility], Pt from home with family. Pt had issues with balance today, able to walk with walker for EMS. Pt denies complaints for EMS, hx of dementia. Family requesting pt to be seen. A7O x3, disoriented to time. Pt reports she felt dizzy earlier today but other than that she feels fine. Pt's daughter Derika Eckles can be contacted at [phone].

## 2018-10-12 ENCOUNTER — Telehealth: Payer: Self-pay | Admitting: *Deleted

## 2018-10-12 LAB — URINALYSIS, ROUTINE W REFLEX MICROSCOPIC
Bilirubin Urine: NEGATIVE
Glucose, UA: NEGATIVE mg/dL
Hgb urine dipstick: NEGATIVE
Ketones, ur: 15 mg/dL — AB
Leukocytes,Ua: NEGATIVE
Nitrite: NEGATIVE
Protein, ur: NEGATIVE mg/dL
Specific Gravity, Urine: 1.02 (ref 1.005–1.030)
pH: 5 (ref 5.0–8.0)

## 2018-10-12 MED ORDER — SODIUM CHLORIDE 0.9 % IV BOLUS (SEPSIS)
1000.0000 mL | Freq: Once | INTRAVENOUS | Status: AC
  Administered 2018-10-12: 1000 mL via INTRAVENOUS

## 2018-10-12 NOTE — ED Notes (Signed)
Pt's grandaughter called by me and provider with no answer.

## 2018-10-12 NOTE — ED Notes (Signed)
All pt's contact's called with no answer.

## 2018-10-12 NOTE — ED Notes (Signed)
Patient ambulated to bathroom with walker, no nursing assistance.

## 2018-10-12 NOTE — ED Provider Notes (Signed)
[facility] EMERGENCY DEPARTMENT Provider Note   CSN: [id number] Arrival date & time: 10/11/18  1731     History   Chief Complaint Chief Complaint  Patient presents with  . Dizziness   Level 5 caveat due to dementia HPI [name] is a 85 y.o. female.     The history is provided by the patient. The history is limited by the condition of the patient.  Dizziness Quality:  Unable to specify Severity:  Moderate Timing:  Constant Progression:  Worsening Chronicity:  New Relieved by:  Nothing Exacerbated by: walking. Patient history dementia, chronic kidney disease, colostomy in place, symptomatic bradycardia with central pacemaker, diabetes presents with reported weakness and difficulty walking.  Patient comes from home, she lives with family.  Family reported patient seemed disoriented and dizzy on ambulation.  Patient denies any complaints. No trauma reported Past Medical History:  Diagnosis Date  . Asthma   . CKD (chronic kidney disease) stage 3, GFR 30-59 ml/min ([place]) 05/01/2018  . Colostomy present ([place])    hx/notes 07/17/2010  . Dementia ([place])   . Diabetic neuropathy ([place])   . Hypertension    hx/notes 07/17/2010  . Pneumonia    recent/notes 10/08/2016  . Symptomatic bradycardia 12/15/2017  . Type II diabetes mellitus ([place])    hx/notes 07/17/2010    Patient Active Problem List   Diagnosis Date Noted  . Symptomatic bradycardia 05/01/2018  . Near syncope 05/01/2018  . CKD (chronic kidney disease) stage 3, GFR 30-59 ml/min ([facility]) 05/01/2018  . Influenza B 04/10/2018  . Generalized weakness 04/10/2018  . Essential hypertension 03/01/2018  . COPD (chronic obstructive pulmonary disease) ([place]) 03/01/2018  . Bradycardia 12/15/2017  . Colostomy present ([place]) 11/08/2016  . Syncope 10/08/2016  . Acute kidney injury ([place]) 10/08/2016  . Abnormal chest x-ray 10/08/2016  . Lethargy 10/08/2016  . Dementia ()   . Sepsis ([place]) 09/05/2016  . Community  acquired pneumonia 09/05/2016  . Toxic metabolic encephalopathy 09/13/1599  . Diabetes mellitus with complication ([place]) 09/32/3557  . Nausea & vomiting 04/09/2012  . Gallstones 03/15/2011  . Partial SBO 03/15/2011  . Colostomy hernia ([place]) 03/15/2011  . PSEUDOGOUT 02/28/2007  . IRON DEFIC ANEMIA [place] DIET IRON INTAKE 02/28/2007  . DISCITIS 02/28/2007  . OSTEOMYELITIS 02/28/2007  . CHILLS WITHOUT FEVER 02/28/2007    Past Surgical History:  Procedure Laterality Date  . ABDOMINAL HYSTERECTOMY    . COLOSTOMY     S/P SBO hx/notes 07/17/2010  . LUMBAR FUSION  11/2006   hx/notes 07/17/2010  . NASAL SINUS SURGERY  04/28/2002   Bilateral inferior turbinate reductions, bilateral maxillary antrotomies, bilateral total ethmoidectomies, bilateral frontal recess explorations, bilateral sphenoidotomies, Instatrac guidance./notes 07/28/2010  . PACEMAKER IMPLANT N/A 05/02/2018   Procedure: PACEMAKER IMPLANT;  Surgeon: Evans Lance, MD;  Location: [facility];  Service: Cardiovascular;  Laterality: N/A;     OB History   No obstetric history on file.      Home Medications    Prior to Admission medications   Medication Sig Start Date End Date Taking? Authorizing Provider  acetaminophen (TYLENOL) 325 MG tablet Take 650 mg by mouth every 6 (six) hours as needed for headache (pain).    [provider]  albuterol (PROVENTIL HFA;VENTOLIN HFA) 108 (90 Base) MCG/ACT inhaler Inhale 1-2 puffs into the lungs every 4 (four) hours as needed for wheezing or shortness of breath.     [provider]  alendronate (FOSAMAX) 70 MG tablet Take 70 mg by  mouth every Saturday.     [provider]  busPIRone (BUSPAR) 5 MG tablet Take 5 mg by mouth 3 (three) times daily. 11/24/17   [provider]  Calcium Citrate-Vitamin D (CALCIUM CITRATE + D) 315-250 MG-UNIT TABS Take 1 tablet by mouth daily.    [provider]  docusate sodium (COLACE) 100 MG capsule Take 1 capsule  (100 mg total) by mouth 2 (two) times daily. 05/03/18   Lavina Hamman, MD  donepezil (ARICEPT) 10 MG tablet Take 10 mg by mouth at bedtime.  02/24/15   [provider]  escitalopram (LEXAPRO) 10 MG tablet Take 10 mg by mouth at bedtime. 06/16/17   [provider]  FLOVENT HFA 220 MCG/ACT inhaler Inhale 1 puff into the lungs 2 (two) times daily. 05/31/18   Mannam, Hart Robinsons, MD  gabapentin (NEURONTIN) 300 MG capsule Take 300 mg by mouth 3 (three) times daily. 10/12/15   [provider]  losartan (COZAAR) 100 MG tablet Take 0.5 tablets (50 mg total) by mouth daily. 04/11/18   Nita Sells, MD  memantine (NAMENDA XR) 28 MG CP24 24 hr capsule Take 28 mg by mouth daily.    [provider]  montelukast (SINGULAIR) 10 MG tablet Take 10 mg by mouth at bedtime.  06/21/17   [provider]  predniSONE (DELTASONE) 10 MG tablet Take 4 tabs po daily x 2 days; then 3 tabs for 2 days; then 2 tabs for 2 days; then 1 tab for 2 days 08/01/18   Martyn Ehrich, NP    Family History Family History  Problem Relation Age of Onset  . Kidney disease Mother   . Hypertension Mother   . Other Father        gangrene with amputation  . Diabetes Sister   . Cancer Brother   . Heart disease Brother   . Schizophrenia Daughter   . Diabetes Daughter   . Stroke Daughter   . Diabetes Sister   . Breast cancer Neg Hx     Social History Social History   Tobacco Use  . Smoking status: Never Smoker  . Smokeless tobacco: Former Systems developer    Types: Chew  Substance Use Topics  . Alcohol use: No  . Drug use: No     Allergies   Aspirin   Review of Systems Review of Systems  Unable to perform ROS: Dementia  Neurological: Positive for dizziness.     Physical Exam Updated Vital Signs BP (!) 100/56   Pulse (!) 59   Temp 98.8 F (37.1 C) (Oral)   Resp 13   SpO2 96%   Physical Exam CONSTITUTIONAL: elderly, no distress HEAD: Normocephalic/atraumatic EYES: EOMI  ENMT: Mask in place NECK: supple no meningeal signs SPINE/BACK:entire spine nontender CV: S1/S2 noted, no murmurs/rubs/gallops noted LUNGS: Lungs are clear to auscultation bilaterally, no apparent distress ABDOMEN: soft, nontender, no rebound or guarding, bowel sounds noted throughout abdomen, colostomy in place GU:no cva tenderness NEURO: Pt is awake/alert moves all extremitiesx4.  No facial droop.  No arm/leg drift Alert and oriented X2 EXTREMITIES: pulses normal/equal, full ROM, no deformitieis SKIN: warm, color normal PSYCH:unable to fully assess  ED Treatments / Results  Labs (all labs ordered are listed, but only abnormal results are displayed) Labs Reviewed  COMPREHENSIVE METABOLIC PANEL - Abnormal; Notable for the following components:      Result Value   Glucose, Bld 121 (*)    BUN 24 (*)    Creatinine, Ser 1.42 (*)  GFR calc non Af Amer 34 (*)    GFR calc Af Amer 39 (*)    All other components within normal limits  PROTIME-INR  APTT  CBC  DIFFERENTIAL    EKG EKG Interpretation  Date/Time:  Wednesday October 12 2018 00:30:06 EDT Ventricular Rate:  60 PR Interval:    QRS Duration: 102 QT Interval:  440 QTC Calculation: 440 R Axis:   -45 Text Interpretation:  ATRIAL PACED RHYTHM LAD, consider left anterior fascicular block Minimal ST elevation, lateral leads Baseline wander in lead(s) V5 No significant change since last tracing Confirmed by Ripley Fraise [phone]) on 10/12/2018 12:54:13 AM   Radiology Ct Head Wo Contrast  Addendum Date: 10/11/2018   ADDENDUM REPORT: 10/11/2018 20:39 ADDENDUM: Additional findings of cerebellar tonsillar ectopia and enlarged empty sella as noted on prior exams. Electronically Signed   By: Donavan Foil M.D.   On: 10/11/2018 20:39   Result Date: 10/11/2018 CLINICAL DATA:  Syncope with bradycardia EXAM: CT HEAD WITHOUT CONTRAST TECHNIQUE: Contiguous axial images were obtained from the base of the skull through the vertex without  intravenous contrast. COMPARISON:  CT brain 04/09/2018 FINDINGS: Brain: No acute territorial infarction, hemorrhage, or intracranial mass. Atrophy and mild small vessel ischemic changes of the white matter. Scattered hypodensities in the bilateral basal ganglia, possible chronic lacunar infarcts. Stable ventricle size. Vascular: No hyperdense vessels.  Carotid vascular calcification Skull: No fracture Sinuses/Orbits: Postsurgical changes of the maxillary sinuses. Increased wall thickness of the maxillary sinuses consistent with chronic disease. Extensive mucosal thickening in the maxillary ethmoid and frontal sinuses with fluid level in the left maxillary sinus. Lobulated appearance of the left greater than right nasal cavity. Other: None IMPRESSION: 1. No CT evidence for acute intracranial abnormality. 2. Atrophy and mild small vessel ischemic change 3. Extensive sinus disease. Lobulated soft tissue within the left greater than right nasal passage, possible polyposis Electronically Signed: By: Donavan Foil M.D. On: 10/11/2018 19:18    Procedures Procedures    Medications Ordered in ED Medications  sodium chloride flush (NS) 0.9 % injection 3 mL (3 mLs Intravenous Not Given 10/12/18 0102)  sodium chloride 0.9 % bolus 1,000 mL (has no administration in time range)     Initial Impression / Assessment and Plan / ED Course  I have reviewed the triage vital signs and the nursing notes.  Pertinent labs   results that were available during my care of the patient were reviewed by me and considered in my medical decision making (see chart for details).      Patient presented from home for reported difficulty walking and weakness.  She is currently awake alert, no focal weakness.  She is alert and oriented x2, suspect this is baseline.  She has a history of  dementia Unable to contact family in initial call.  Will give IV fluids, interrogate pacemaker and reassess Mild dehydration noted CT head  negative  1:34 AM Discussed with granddaughter, patient reports she has felt  dizzy.  She did almost fall today, no LOC.  No trauma. 3:05 AM [facility] pacemaker was interrogated.  No acute issues per medical rep. Patient now receiving IV fluids 3:57 AM Patient improved, able to ambulate with walker.  Will check urinalysis and likely discharge   5:43 AM Urinalysis negative.  Vitals appropriate.  No signs of acute stroke.  Will discharge home.  Home health has been ordered for patient Final Clinical Impressions(s) / ED Diagnoses   Final diagnoses:  None  ED Discharge Orders    None       Ripley Fraise, MD 10/12/18 [phone]

## 2018-10-12 NOTE — Progress Notes (Unsigned)
Alert received that pt has AP blunting all at lower rate, outputs still programmed at 3.5V. Pt unable to make appt that was scheduled in June. appt notes from that appt state "COVID/AMBER   IN OFFICE...91 DAY POST IMPLANT FU APPT FROM 05-02-18-[facility]LE[facility] PT TAYLOR IMPLANTED/mt; consider enabling rate response//ECM."

## 2018-10-12 NOTE — Discharge Instructions (Addendum)

## 2018-10-12 NOTE — Telephone Encounter (Signed)
Pt grand daughter [name]) called inquiring about location of pt.  EDCM reviewed chart to find that ED staff had numerous attempts of contacting [place] for updates and transportation home as she requested.  [name] states that she did not hear phone ringing and does not have transportation to pick-up pt.    EDCM found that pt was waiting in the lobby in a wheelchair. EDCM provided taxi voucher and "happy meal" for pt along with orange juice a requested.  [employer] cab # [id number] arrived to transport pt to address listed in chart.  No further EDCM needs identified.

## 2018-10-13 ENCOUNTER — Ambulatory Visit: Payer: Medicare Other

## 2018-10-13 ENCOUNTER — Other Ambulatory Visit: Payer: Self-pay

## 2018-10-13 NOTE — Patient Outreach (Signed)
[facility]) Care Management  10/13/2018  [name] 03/13/1934 [id number]    Attempted to contact patient's granddaughter, [name], today to follow up on social work referral for transportation resources.  BSW and daughter discussed transportation services through [place] during initial outreach earlier this week.  Granddaughter reports that patient needs assistance with getting down the stairs in the front of her home and those currently living with her are unable to assist due to their own health challenges. For liability reasons, staff with [facility] are not able to provide this type of hands on assistance.  BSW agreed to contact [employer] to inquire about eligibility for their services.   BSW was able to connect with [place] from [employer] eligibility.  Per [place] Sous[place], patient is likely eligible for services but [facility] staff are not allowed to assist patient down the stairs/provide "hands on" assistance.  Someone would need to be at patient's home to assist with this when [employer] bus arrives.   BSW attempted to contact granddaughter today to discuss this and possibly submit a referral to [facility] for ramp.  Mobile number is out of service.  No answer or option to leave message at home number.  Will attempt to reach again within four business days.  [name], BSW Social Worker [phone]

## 2018-10-14 ENCOUNTER — Other Ambulatory Visit: Payer: Self-pay

## 2018-10-14 NOTE — Patient Outreach (Signed)
[facility]) Care Management  10/14/2018  [name] [birth year] [id number]  Care coordination:   Voice mail message received from [place], Clinical  supervisor requesting return call. RNCM attempted return call to [facility], clinical supervisor x 2. Voice mail message received from [place] requesting return call.  RNCM contacted Kennyth Lose, clinical supervisor with Dr. Laurell Roof office. Kennyth Lose states her call is to clarify [facility] care management services and RNCM  recommendation discussed with [facility].   RNCM explained to Kennyth Lose that [facility] care management would provide RN Health coach follow up with patient's caregiver/ granddaughter for diabetes education/ management.  Also follow up with [facility] care management social worker for transportation and community resource assistance.  Explained RNCM made recommendations for patient to have an  order for a glucometer and referral to home health for nursing/ physical therapy and home health aid  based on telephone assessment of patient with granddaughter and barriers of care discussed. Informed Kennyth Lose that RN health coach and social worker have already reached out to patient's granddaughter to start assisting with care. Kennyth Lose verbalized understanding and expressed appreciation for services being provided.  Kennyth Lose states she will follow up with patients primary care provider regarding order for glucometer and home health services.   PLAN; No further follow up needed by this RNCM at this time.   Quinn Plowman RN,BSN,CCM [facility] Telephonic  [phone]

## 2018-10-17 ENCOUNTER — Ambulatory Visit: Payer: Self-pay

## 2018-10-17 ENCOUNTER — Other Ambulatory Visit: Payer: Self-pay

## 2018-10-17 NOTE — Patient Outreach (Signed)
[place] [facility]) Care Management  10/17/2018  [name] 13-Nov-1933 [id number]    Second unsuccessful attempt to contact patient's granddaughter, [name],  to follow up on social work referral for transportation resources.  During initial outreach on 10/11/18, BSW and daughter discussed transportation services through [facility] and [place].  Granddaughter reports that patient needs assistance with getting down the stairs in the front of her home and those currently living with her are unable to assist due to their own health challenges. For liability reasons, staff with [facility] are not able to provide this type of hands on assistance.  BSW agreed to contact [employer] to inquire about eligibility for their services.   BSW was able to connect with Courtney from [employer] eligibility.  Per Loma Sousa, patient is likely eligible for services but [facility] staff are not allowed to assist patient down the stairs/provide "hands on" assistance.  Someone would need to be at patient's home to assist with this when [employer] bus arrives.   BSW attempted to contact granddaughter for the second time today to discuss this and possibly submit a referral to [facility] for ramp.  Mobile number is out of service.  No answer or option to leave message at home number.  Will attempt to reach again within four business days. Mailed unsuccessful outreach letter.  [name], BSW Social Worker [phone]

## 2018-10-18 ENCOUNTER — Ambulatory Visit: Payer: Self-pay

## 2018-10-18 ENCOUNTER — Other Ambulatory Visit: Payer: Self-pay

## 2018-10-18 NOTE — Patient Outreach (Signed)
[facility]) Care Management  10/18/2018  JACQUALIN SHIRKEY 1933/10/30 [id number]   Third unsuccessful attempt to contact patient's granddaughter, [name],  to follow up on social work referral for transportation resources.  During initial outreach on 10/11/18, BSW and daughter discussed transportation services through [facility] and [place].  Granddaughter reports that patient needs assistance with getting down the stairs in the front of her home and those currently living with her are unable to assist due to their own health challenges. For liability reasons, staff with [facility] are not able to provide this type of hands on assistance. BSW agreed to contact [employer] to inquire about eligibility for their services.  BSW was able to connect with Courtney from [employer] eligibility. Per Loma Sousa, patient is likely eligible for services but [facility] staff are not allowed to assist patient down the stairs/provide "hands on" assistance. Someone would need to be at patient's home to assist with this when [employer] bus arrives.  BSW attempted to contact granddaughter for the third time today to discuss this and possibly submit a referral to [facility] for ramp. Mobile number is out of service. No answer or option to leave message at home number. Mailed unsuccessful outreach letter on 10/17/18.  Will close case if no response by 10/26/18.    [name], BSW Social Worker [phone]

## 2018-10-19 ENCOUNTER — Encounter: Payer: Self-pay | Admitting: *Deleted

## 2018-10-19 NOTE — Telephone Encounter (Signed)
This encounter was created in error - please disregard.

## 2018-10-25 ENCOUNTER — Other Ambulatory Visit: Payer: Self-pay | Admitting: *Deleted

## 2018-10-25 NOTE — Patient Outreach (Signed)
[facility] [facility]) Care Management  10/25/2018  [name] [birth year] [id number]  RN Health Coach attempted follow up outreach call to patient.  Patient was unavailable. No  voicemail pickup. Plan: RN will call patient again within 30 days.  [place] Care Management [phone]

## 2018-10-26 ENCOUNTER — Other Ambulatory Visit: Payer: Self-pay

## 2018-10-26 NOTE — Patient Outreach (Signed)
[place] [facility]) Care Management  10/26/2018  [name] 07/08/1933 [id number]   THN BSW case closure due to inability to maintain contact.  [name], BSW Social Worker [phone]

## 2018-10-27 ENCOUNTER — Other Ambulatory Visit: Payer: Self-pay

## 2018-10-27 NOTE — Patient Outreach (Signed)
[name]) Care Management  10/27/2018  [name] March 29, 1933 [id number]   In-basket message received from [place], [place] [facility], to contact patient's daughter [name].  Left voicemail message at number provided.  [name], BSW Social Worker [phone]

## 2018-10-31 ENCOUNTER — Other Ambulatory Visit: Payer: Self-pay | Admitting: *Deleted

## 2018-10-31 NOTE — Patient Outreach (Signed)
[place] [facility]) Care Management  10/31/2018  MEDDIE VENNEMAN 01-Sep-1933 [id number]   RN Health Coach attempted follow up outreach call to patient.  Patient was unavailable. HIPPA compliance voicemail message left with return callback number.  Plan: RN will call patient again within 30 days.  [place] Care Management [phone]

## 2018-11-01 ENCOUNTER — Other Ambulatory Visit: Payer: Self-pay

## 2018-11-01 NOTE — Patient Outreach (Signed)
[place] [facility]) Care Management  11/01/2018  MEIRAV SAS [birth year] [id number]   Received voicemail message from patient's granddaughter, Kriti Full, on 10/31/18.  She asked for a return call to [phone].  Attempted to contact her today but no answer or option to leave message.  [name], BSW Social Worker [phone]

## 2018-11-02 ENCOUNTER — Ambulatory Visit (INDEPENDENT_AMBULATORY_CARE_PROVIDER_SITE_OTHER): Payer: Medicare Other | Admitting: *Deleted

## 2018-11-02 DIAGNOSIS — R001 Bradycardia, unspecified: Secondary | ICD-10-CM

## 2018-11-02 LAB — CUP PACEART REMOTE DEVICE CHECK
Battery Remaining Longevity: 76 mo
Battery Remaining Percentage: 95.5 %
Battery Voltage: 3.01 V
Brady Statistic AP VP Percent: 1 %
Brady Statistic AP VS Percent: 70 %
Brady Statistic AS VP Percent: 1 %
Brady Statistic AS VS Percent: 30 %
Brady Statistic RA Percent Paced: 69 %
Brady Statistic RV Percent Paced: 1 %
Date Time Interrogation Session: 20200819061139
Implantable Lead Implant Date: 20200217
Implantable Lead Implant Date: 20200217
Implantable Lead Location: 753859
Implantable Lead Location: 753860
Implantable Pulse Generator Implant Date: 20200217
Lead Channel Impedance Value: 450 Ohm
Lead Channel Impedance Value: 600 Ohm
Lead Channel Pacing Threshold Amplitude: 0.5 V
Lead Channel Pacing Threshold Amplitude: 0.5 V
Lead Channel Pacing Threshold Pulse Width: 0.5 ms
Lead Channel Pacing Threshold Pulse Width: 0.5 ms
Lead Channel Sensing Intrinsic Amplitude: 12 mV
Lead Channel Sensing Intrinsic Amplitude: 2 mV
Lead Channel Setting Pacing Amplitude: 3.5 V
Lead Channel Setting Pacing Amplitude: 3.5 V
Lead Channel Setting Pacing Pulse Width: 0.5 ms
Lead Channel Setting Sensing Sensitivity: 2 mV
Pulse Gen Model: 2272
Pulse Gen Serial Number: 9107620

## 2018-11-10 ENCOUNTER — Other Ambulatory Visit: Payer: Self-pay | Admitting: *Deleted

## 2018-11-10 ENCOUNTER — Encounter: Payer: Self-pay | Admitting: *Deleted

## 2018-11-10 ENCOUNTER — Encounter: Payer: Self-pay | Admitting: Cardiology

## 2018-11-10 NOTE — Progress Notes (Signed)
Remote pacemaker transmission.   

## 2018-11-14 NOTE — Patient Outreach (Addendum)
[facility]) Care Management  11/10/2018 Late entry  [name] 10/20/1933 [id number]  [place] telephone call to patient.  Hipaa compliance verified. Per granddaughter [name]) the patient has dementia. She does not have any lancets and have not been checking her blood sugar. RN discussed with granddaughter on contacting Dr office and having supplies. Granddaughter states that the patient needs eye exams, dental exam and hearing test. Per [name] they need work on the house but is unable to afford it. Patient is having episodes of hypertension. Patient does not have a blood pressure monitor. [name] has agreed to further  outreach calls.   Current Medications:  Current Outpatient Medications  Medication Sig Dispense Refill  . acetaminophen (TYLENOL) 325 MG tablet Take 650 mg by mouth every 6 (six) hours as needed for headache (pain).    Marland Kitchen albuterol (PROVENTIL HFA;VENTOLIN HFA) 108 (90 Base) MCG/ACT inhaler Inhale 1-2 puffs into the lungs every 4 (four) hours as needed for wheezing or shortness of breath.     Marland Kitchen alendronate (FOSAMAX) 70 MG tablet Take 70 mg by mouth every Saturday.     . busPIRone (BUSPAR) 5 MG tablet Take 5 mg by mouth 3 (three) times daily.  0  . Calcium Citrate-Vitamin D (CALCIUM CITRATE + D) 315-250 MG-UNIT TABS Take 1 tablet by mouth daily.    Marland Kitchen docusate sodium (COLACE) 100 MG capsule Take 1 capsule (100 mg total) by mouth 2 (two) times daily. 10 capsule 0  . donepezil (ARICEPT) 10 MG tablet Take 10 mg by mouth at bedtime.   1  . escitalopram (LEXAPRO) 10 MG tablet Take 10 mg by mouth at bedtime.  5  . FLOVENT HFA 220 MCG/ACT inhaler Inhale 1 puff into the lungs 2 (two) times daily. 1 Inhaler 2  . gabapentin (NEURONTIN) 300 MG capsule Take 300 mg by mouth 3 (three) times daily.  1  . losartan (COZAAR) 100 MG tablet Take 0.5 tablets (50 mg total) by mouth daily.    . memantine (NAMENDA XR) 28 MG CP24 24 hr capsule Take 28 mg by mouth daily.    .  montelukast (SINGULAIR) 10 MG tablet Take 10 mg by mouth at bedtime.   5  . predniSONE (DELTASONE) 10 MG tablet Take 4 tabs po daily x 2 days; then 3 tabs for 2 days; then 2 tabs for 2 days; then 1 tab for 2 days 20 tablet 0   No current facility-administered medications for this visit.     Functional Status:  In your present state of health, do you have any difficulty performing the following activities: 11/10/2018 05/01/2018  Hearing? N N  Vision? N N  Difficulty concentrating or making decisions? Y Y  Comment Patient has dementia -  Walking or climbing stairs? Y Y  Comment Ambulates with walker -  Dressing or bathing? Tempie Donning  Comment patient needs assistance with all ADL -  Doing errands, shopping? Y Y  Comment Unable to do any errands herself due to the dementia -  Preparing Food and eating ? Y -  Comment grand daughter prepares -  Using the Toilet? Y -  Comment incont of bladder -  In the past six months, have you accidently leaked urine? Y -  Do you have problems with loss of bowel control? N -  Managing your Medications? Y -  Comment Granddaughter has to administer -  Managing your Finances? Y -  Comment unable to do finances by her self -  Housekeeping or managing your Housekeeping? Y -  Comment Granddaughter does -  Some recent data might be hidden    Fall/Depression Screening: Fall Risk  11/10/2018 07/28/2017  Falls in the past year? 1 No  Number falls in past yr: 1 -  Injury with Fall? 1 -  Comment Hit head on dresser when passed out -  Risk for fall due to : History of fall(s);Impaired balance/gait;Impaired mobility -  Follow up Falls evaluation completed;Education provided;Falls prevention discussed -   No flowsheet data found.  THN CM Care Plan Problem One     Most Recent Value  Care Plan Problem One  Knowledge Deficit in Self knowledge of Diabetes  Role Documenting the Problem One  Health Coach  Care Plan for Problem One  Active  THN Long Term Goal   Patient  and grandaughter will see a decrease in the A1C from 9.3 within the next 90 days  THN Long Term Goal Start Date  10/31/18  Interventions for Problem One Long Term Goal  RN discess what the patient A1C is. Rn discusse what the FBS should be to see a decrease in the A1C. RN discussed with grandaughter to call Dr office and get the lancets for the meter called in. RN will follow up for compliance  THN CM Short Term Goal #1   Grandaughter will check patient blood sugars as per physician ordered within the next 90 days  THN CM Short Term Goal #1 Start Date  10/31/18  [facility] CM Short Term Goal #1 Met Date  10/31/18  Interventions for Short Term Goal #1  RN discussed checking the blood sugars. RN discussed with grandaughter about how to get the supplies ordered. RN will follow up for compliance.  THN CM Short Term Goal #2   Patient will not have any falls within the nex 30 days  THN CM Short Term Goal #2 Start Date  10/31/18  Interventions for Short Term Goal #2  Rn discussed fall prevention. RN sent educational materialon fall prevention. RN will follow up with further discussion       Assessment:  A1C 9.3 Caregiver has not checked blood sugar Patient needs blood sugar testing supplies Patient will benefit from [place] telephonic outreach for education and support for diabetes self management.  Plan:  Granddaughter will contact Dr office for supplies Referred to social worker RN discussed symptoms of high and low blood sugar Rn sent educational material on High and low blood sugars RN sent blood pressure monitor RN sent Living well with diabetes booklet RN sent barriers letter to [place] Management [phone]

## 2018-11-16 ENCOUNTER — Other Ambulatory Visit: Payer: Self-pay

## 2018-11-16 NOTE — Patient Outreach (Signed)
[facility]) Care Management  11/16/2018  [name] Aug 16, 1933 [id number]   Social work referral received from [employer], [employer].  "Patient granddaughter is caregiver. Patient has Alzheimer's. She is asking about different things such as elder law in terms of if her grandmother has to go into a nursing home about signing the house over to her. How long will it have to be signed over and the penalties. She is stating the house needs a lot of work and asking about any organizations that would help" Attempted to contact granddaughter today.  Mobile number disconnected.  Patient answered home number and stated granddaughter was not home; asked me to call back around 4:00 today.    [name], BSW Social Worker [phone]

## 2018-11-17 ENCOUNTER — Other Ambulatory Visit: Payer: Self-pay

## 2018-11-17 NOTE — Patient Outreach (Signed)
[name]) Care Management  11/17/2018  [name] 1933/07/02 [id number]   Social work referral received from [employer].  "Patient granddaughter is caregiver. Patient has Alzheimer's. She is asking about different things such as elder law in terms of if her grandmother has to go into a nursing home about signing the house over to her. How long will it have to be signed over and the penalties. She is stating the house needs a lot of work and asking about any organizations that would help" Second attempt to contact granddaughter today.  Mobile number disconnected.  No answer at home number. Will attempt to reach again within four business days.   [name], BSW Social Worker [phone]

## 2018-11-24 ENCOUNTER — Other Ambulatory Visit: Payer: Self-pay

## 2018-11-24 ENCOUNTER — Ambulatory Visit: Payer: Self-pay

## 2018-11-24 NOTE — Patient Outreach (Signed)
[facility]) Care Management  11/24/2018  [name] 07/06/1933 [id number]   Social work referral received from [employer]. "Patient granddaughter is caregiver. Patient has Alzheimer's. She is asking about different things such as elder law in terms of if her grandmother has to go into a nursing home about signing the house over to her. How long will it have to be signed over and the penalties. She is stating the house needs a lot of work and asking about any organizations that would help" Third attempt to contact granddaughter today. Mobile number disconnected. Called home number multiple times throughout the day and it was busy each time. Will close case to social work if no response by 11/29/18.  [name], BSW Social Worker [phone]

## 2018-11-29 ENCOUNTER — Other Ambulatory Visit: Payer: Self-pay

## 2018-11-29 NOTE — Patient Outreach (Signed)
[place] [facility]) Care Management  11/29/2018  BONITA GIORDANI September 14, 1933 [id number]   Closing social work case due to inability to contact.  [name], BSW Social Worker [phone]

## 2019-01-13 ENCOUNTER — Encounter: Payer: Medicare Other | Admitting: Internal Medicine

## 2019-01-17 ENCOUNTER — Encounter: Payer: Self-pay | Admitting: General Practice

## 2019-01-17 NOTE — Telephone Encounter (Signed)
Error

## 2019-01-20 ENCOUNTER — Encounter: Payer: Self-pay | Admitting: Internal Medicine

## 2019-01-24 ENCOUNTER — Other Ambulatory Visit: Payer: Self-pay | Admitting: *Deleted

## 2019-01-24 NOTE — Patient Outreach (Signed)
[place] [facility]) Care Management  01/24/2019  [name] 1933/10/14 [id number]   RN Health Coach telephone call to patient grand daughter. Patient has dementia. Per granddaughter she is in the bak and will call me back.  [place] Care Management [phone]

## 2019-01-24 NOTE — Patient Outreach (Signed)
[place] [facility]) Care Management  01/24/2019  [name] Aug 15, 1933 [id number]   RN Health Coach received return telephone call from patient grandaughter.  Hipaa compliance verified. [name] daughter is not sure if she received the information RN Health Coach sent. RN Health Coach had sent a BP monitor and grand daughter said she wasn't sure she received it. [place] daughter stated that she gets a lot of mail and I don't know what's all is there. Per grand daughter she picked up the strips to check the patient blood sugar but she hasn't checked it because she doesn't know how to do it. RN asked why didn't she ask the pharmacy or the Dr office and she stated she didn't think about it. RN Health Coach explained there is also a video on you tube since she has a phone compatible to show her. RN explained that the social worker had been trying to get in touch with her to discuss the needs she had verbalized last outreach. [place] daughter stated phone ringer had been cut off so son could sleep.   Plan: Patient grand daughter will look at you tube video or take meter  with her for the next Dr visit to show her how to use the meter. RN Health coach will follow up outreach within the month of December   [name] Care Management [phone]

## 2019-01-26 ENCOUNTER — Other Ambulatory Visit: Payer: Self-pay

## 2019-01-26 NOTE — Patient Outreach (Signed)
[place] [facility]) Care Management  01/26/2019  [name] April 05, 1933 [id number]   Received voicemail message from patient's daughter, [name], asking for a call back as patient is in need of "resources."  Attempted to call her back today at number she indicated as best contact number.  [phone] No answer or option to leave message.  [name], BSW Social Worker [phone]

## 2019-01-28 ENCOUNTER — Other Ambulatory Visit: Payer: Self-pay

## 2019-01-28 ENCOUNTER — Emergency Department (HOSPITAL_COMMUNITY): Payer: Medicare Other

## 2019-01-28 ENCOUNTER — Encounter (HOSPITAL_COMMUNITY): Payer: Self-pay

## 2019-01-28 ENCOUNTER — Emergency Department (HOSPITAL_COMMUNITY)
Admission: EM | Admit: 2019-01-28 | Discharge: 2019-01-29 | Disposition: A | Discharge status: Home / Self Care | Payer: Medicare Other | Attending: Emergency Medicine | Admitting: Emergency Medicine

## 2019-01-28 DIAGNOSIS — Z950 Presence of cardiac pacemaker: Secondary | ICD-10-CM | POA: Insufficient documentation

## 2019-01-28 DIAGNOSIS — Z79899 Other long term (current) drug therapy: Secondary | ICD-10-CM | POA: Diagnosis not present

## 2019-01-28 DIAGNOSIS — I129 Hypertensive chronic kidney disease with stage 1 through stage 4 chronic kidney disease, or unspecified chronic kidney disease: Secondary | ICD-10-CM | POA: Diagnosis not present

## 2019-01-28 DIAGNOSIS — Z87891 Personal history of nicotine dependence: Secondary | ICD-10-CM | POA: Insufficient documentation

## 2019-01-28 DIAGNOSIS — R519 Headache, unspecified: Secondary | ICD-10-CM | POA: Diagnosis not present

## 2019-01-28 DIAGNOSIS — E1122 Type 2 diabetes mellitus with diabetic chronic kidney disease: Secondary | ICD-10-CM | POA: Diagnosis not present

## 2019-01-28 DIAGNOSIS — N183 Chronic kidney disease, stage 3 unspecified: Secondary | ICD-10-CM | POA: Insufficient documentation

## 2019-01-28 DIAGNOSIS — R42 Dizziness and giddiness: Secondary | ICD-10-CM | POA: Diagnosis not present

## 2019-01-28 LAB — CBC WITH DIFFERENTIAL/PLATELET
Abs Immature Granulocytes: 0.03 10*3/uL (ref 0.00–0.07)
Basophils Absolute: 0.1 10*3/uL (ref 0.0–0.1)
Basophils Relative: 1 %
Eosinophils Absolute: 0.3 10*3/uL (ref 0.0–0.5)
Eosinophils Relative: 5 %
HCT: 39.1 % (ref 36.0–46.0)
Hemoglobin: 12.5 g/dL (ref 12.0–15.0)
Immature Granulocytes: 1 %
Lymphocytes Relative: 26 %
Lymphs Abs: 1.7 10*3/uL (ref 0.7–4.0)
MCH: 30.6 pg (ref 26.0–34.0)
MCHC: 32 g/dL (ref 30.0–36.0)
MCV: 95.8 fL (ref 80.0–100.0)
Monocytes Absolute: 0.4 10*3/uL (ref 0.1–1.0)
Monocytes Relative: 6 %
Neutro Abs: 4 10*3/uL (ref 1.7–7.7)
Neutrophils Relative %: 61 %
Platelets: 227 10*3/uL (ref 150–400)
RBC: 4.08 MIL/uL (ref 3.87–5.11)
RDW: 12.4 % (ref 11.5–15.5)
WBC: 6.5 10*3/uL (ref 4.0–10.5)
nRBC: 0 % (ref 0.0–0.2)

## 2019-01-28 LAB — COMPREHENSIVE METABOLIC PANEL
ALT: 6 U/L (ref 0–44)
AST: 20 U/L (ref 15–41)
Albumin: 3.5 g/dL (ref 3.5–5.0)
Alkaline Phosphatase: 80 U/L (ref 38–126)
Anion gap: 11 (ref 5–15)
BUN: 23 mg/dL (ref 8–23)
CO2: 23 mmol/L (ref 22–32)
Calcium: 9.2 mg/dL (ref 8.9–10.3)
Chloride: 104 mmol/L (ref 98–111)
Creatinine, Ser: 1.42 mg/dL — ABNORMAL HIGH (ref 0.44–1.00)
GFR calc Af Amer: 39 mL/min — ABNORMAL LOW (ref >60)
GFR calc non Af Amer: 34 mL/min — ABNORMAL LOW (ref >60)
Glucose, Bld: 132 mg/dL — ABNORMAL HIGH (ref 70–99)
Potassium: 5.1 mmol/L (ref 3.5–5.1)
Sodium: 138 mmol/L (ref 135–145)
Total Bilirubin: 0.7 mg/dL (ref 0.3–1.2)
Total Protein: 6.9 g/dL (ref 6.5–8.1)

## 2019-01-28 LAB — URINALYSIS, ROUTINE W REFLEX MICROSCOPIC
Bilirubin Urine: NEGATIVE
Glucose, UA: NEGATIVE mg/dL
Hgb urine dipstick: NEGATIVE
Ketones, ur: NEGATIVE mg/dL
Nitrite: NEGATIVE
Protein, ur: NEGATIVE mg/dL
Specific Gravity, Urine: 1.006 (ref 1.005–1.030)
pH: 6 (ref 5.0–8.0)

## 2019-01-28 MED ORDER — SODIUM CHLORIDE 0.9 % IV BOLUS
500.0000 mL | Freq: Once | INTRAVENOUS | Status: AC
  Administered 2019-01-28: 500 mL via INTRAVENOUS

## 2019-01-28 MED ORDER — ACETAMINOPHEN 325 MG PO TABS
650.0000 mg | ORAL_TABLET | Freq: Once | ORAL | Status: AC
  Administered 2019-01-28: 650 mg via ORAL
  Filled 2019-01-28: qty 2

## 2019-01-28 NOTE — ED Provider Notes (Signed)
I received pt in signout from Dr. Tamera Punt. She was awaiting completion of IV fluid bolus and reassessment.  On reassessment, she was alert, sitting up and comfortable.  She denied any complaints.  She had already ambulated with walker without problems in the ED.  Discussed supportive measures and return precautions.   Jaymes Hang, Wenda Overland, MD 01/28/19 2251

## 2019-01-28 NOTE — ED Triage Notes (Addendum)
Patient arrived by [facility] for nausea for 2 days, reports vomiting yesterday but none today. Denies abdominal pain. Dried emesis noted on gown.  Patient denies headache, denies fall, alert to baseline

## 2019-01-28 NOTE — ED Triage Notes (Signed)
Pt from home via ems; woke this am with ha; constant all day, pain 3/10; denies blurry vision, no dizziness, stroke screen neg; pt had a mechanical fall at 1300 this afternoon, fell on bottom; a and o to place, self, hx dementia; pt at baseline per pt's granddaughter   140/78 P 60 CBG 155

## 2019-01-28 NOTE — ED Provider Notes (Signed)
[facility] EMERGENCY DEPARTMENT Provider Note   CSN: [id number] Arrival date & time: 01/28/19  1753     History   Chief Complaint Chief Complaint  Patient presents with  . Headache    HPI GIAVANNA KERBY is a 85 y.o. female.     Patient is a 85 year old female who presents with a reported headache.  She has a history of diabetes, hypertension, chronic kidney disease and dementia.  She lives at home with her granddaughter.  Per EMS report, she has had a headache with associated nausea and vomiting.  Patient currently denies any symptoms.  There was a concern for a fall earlier today but patient says that she has not fallen.  She denies any current headache.  She denies any vomiting today.  She denies any abdominal pain.  She says that she is ambulating okay.  She feels a little bit weak but otherwise denies complaints.  She does have a history of dementia so history may be limited although she is oriented to person and place.  She does not know the year.  She does note that she lives at home with her granddaughter.  She was previously living with her daughter who currently is deceased.     Past Medical History:  Diagnosis Date  . Asthma   . CKD (chronic kidney disease) stage 3, GFR 30-59 ml/min 05/01/2018  . Colostomy present ([place])    hx/notes 07/17/2010  . Dementia ([place])   . Diabetic neuropathy ([place])   . Hypertension    hx/notes 07/17/2010  . Pneumonia    recent/notes 10/08/2016  . Symptomatic bradycardia 12/15/2017  . Type II diabetes mellitus ([place])    hx/notes 07/17/2010    Patient Active Problem List   Diagnosis Date Noted  . Symptomatic bradycardia 05/01/2018  . Near syncope 05/01/2018  . CKD (chronic kidney disease) stage 3, GFR 30-59 ml/min 05/01/2018  . Influenza B 04/10/2018  . Generalized weakness 04/10/2018  . Essential hypertension 03/01/2018  . COPD (chronic obstructive pulmonary disease) ([place]) 03/01/2018  . Bradycardia 12/15/2017  . Colostomy  present ([place]) 11/08/2016  . Syncope 10/08/2016  . Acute kidney injury ([place]) 10/08/2016  . Abnormal chest x-ray 10/08/2016  . Lethargy 10/08/2016  . Dementia ([place])   . Sepsis ([place]) 09/05/2016  . Community acquired pneumonia 09/05/2016  . Toxic metabolic encephalopathy [id number]  . Diabetes mellitus with complication ([place]) [id number]  . Nausea & vomiting 04/09/2012  . Gallstones 03/15/2011  . Partial SBO 03/15/2011  . Colostomy hernia ([place]) 03/15/2011  . PSEUDOGOUT 02/28/2007  . IRON DEFIC ANEMIA [place] DIET IRON INTAKE 02/28/2007  . DISCITIS 02/28/2007  . OSTEOMYELITIS 02/28/2007  . CHILLS WITHOUT FEVER 02/28/2007    Past Surgical History:  Procedure Laterality Date  . ABDOMINAL HYSTERECTOMY    . COLOSTOMY     S/P SBO hx/notes 07/17/2010  . LUMBAR FUSION  11/2006   hx/notes 07/17/2010  . NASAL SINUS SURGERY  04/28/2002   Bilateral inferior turbinate reductions, bilateral maxillary antrotomies, bilateral total ethmoidectomies, bilateral frontal recess explorations, bilateral sphenoidotomies, Instatrac guidance./notes 07/28/2010  . PACEMAKER IMPLANT N/A 05/02/2018   Procedure: PACEMAKER IMPLANT;  Surgeon: Evans Lance, MD;  Location: [facility];  Service: Cardiovascular;  Laterality: N/A;     OB History   No obstetric history on file.      Home Medications    Prior to Admission medications   Medication Sig Start Date End Date Taking? Authorizing Provider  acetaminophen (TYLENOL) 325 MG tablet Take  650 mg by mouth every 6 (six) hours as needed for headache (pain).    [provider]  albuterol (PROVENTIL HFA;VENTOLIN HFA) 108 (90 Base) MCG/ACT inhaler Inhale 1-2 puffs into the lungs every 4 (four) hours as needed for wheezing or shortness of breath.     [provider]  alendronate (FOSAMAX) 70 MG tablet Take 70 mg by mouth every Saturday.     [provider]  busPIRone (BUSPAR) 5 MG tablet Take 5 mg by mouth 3 (three) times daily. 11/24/17    [provider]  Calcium Citrate-Vitamin D (CALCIUM CITRATE + D) 315-250 MG-UNIT TABS Take 1 tablet by mouth daily.    [provider]  docusate sodium (COLACE) 100 MG capsule Take 1 capsule (100 mg total) by mouth 2 (two) times daily. 05/03/18   Lavina Hamman, MD  donepezil (ARICEPT) 10 MG tablet Take 10 mg by mouth at bedtime.  02/24/15   [provider]  escitalopram (LEXAPRO) 10 MG tablet Take 10 mg by mouth at bedtime. 06/16/17   [provider]  FLOVENT HFA 220 MCG/ACT inhaler Inhale 1 puff into the lungs 2 (two) times daily. 05/31/18   Mannam, Hart Robinsons, MD  gabapentin (NEURONTIN) 300 MG capsule Take 300 mg by mouth 3 (three) times daily. 10/12/15   [provider]  losartan (COZAAR) 100 MG tablet Take 0.5 tablets (50 mg total) by mouth daily. 04/11/18   Nita Sells, MD  memantine (NAMENDA XR) 28 MG CP24 24 hr capsule Take 28 mg by mouth daily.    [provider]  montelukast (SINGULAIR) 10 MG tablet Take 10 mg by mouth at bedtime.  06/21/17   [provider]  predniSONE (DELTASONE) 10 MG tablet Take 4 tabs po daily x 2 days; then 3 tabs for 2 days; then 2 tabs for 2 days; then 1 tab for 2 days 08/01/18   Martyn Ehrich, NP    Family History Family History  Problem Relation Age of Onset  . Kidney disease Mother   . Hypertension Mother   . Other Father        gangrene with amputation  . Diabetes Sister   . Cancer Brother   . Heart disease Brother   . Schizophrenia Daughter   . Diabetes Daughter   . Stroke Daughter   . Diabetes Sister   . Breast cancer Neg Hx     Social History Social History   Tobacco Use  . Smoking status: Never Smoker  . Smokeless tobacco: Former Systems developer    Types: Chew  Substance Use Topics  . Alcohol use: No  . Drug use: No     Allergies   Aspirin   Review of Systems Review of Systems  Constitutional: Negative for chills, diaphoresis, fatigue and fever.  HENT: Negative for  congestion, rhinorrhea and sneezing.   Eyes: Negative.   Respiratory: Negative for cough, chest tightness and shortness of breath.   Cardiovascular: Negative for chest pain and leg swelling.  Gastrointestinal: Negative for abdominal pain, blood in stool, diarrhea, nausea and vomiting.  Genitourinary: Negative for difficulty urinating, flank pain, frequency and hematuria.  Musculoskeletal: Negative for arthralgias and back pain.  Skin: Negative for rash.  Neurological: Positive for weakness. Negative for dizziness, speech difficulty, numbness and headaches.     Physical Exam Updated Vital Signs BP (!) 158/80   Pulse (!) 58   Temp 98.4 F (36.9 C) (Oral)   Resp 16   SpO2 96%   Physical Exam Constitutional:  Appearance: She is well-developed.  HENT:     Head: Normocephalic and atraumatic.  Eyes:     Pupils: Pupils are equal, round, and reactive to light.  Neck:     Musculoskeletal: Normal range of motion and neck supple.  Cardiovascular:     Rate and Rhythm: Normal rate and regular rhythm.     Heart sounds: Normal heart sounds.  Pulmonary:     Effort: Pulmonary effort is normal. No respiratory distress.     Breath sounds: Normal breath sounds. No wheezing or rales.  Chest:     Chest wall: No tenderness.  Abdominal:     General: Bowel sounds are normal.     Palpations: Abdomen is soft.     Tenderness: There is no abdominal tenderness. There is no guarding or rebound.  Musculoskeletal: Normal range of motion.  Lymphadenopathy:     Cervical: No cervical adenopathy.  Skin:    General: Skin is warm and dry.     Findings: No rash.  Neurological:     Mental Status: She is alert.     Cranial Nerves: No cranial nerve deficit, dysarthria or facial asymmetry.     Sensory: No sensory deficit.     Motor: No weakness.      ED Treatments / Results  Labs (all labs ordered are listed, but only abnormal results are displayed) Labs Reviewed  COMPREHENSIVE METABOLIC PANEL -  Abnormal; Notable for the following components:      Result Value   Glucose, Bld 132 (*)    Creatinine, Ser 1.42 (*)    GFR calc non Af Amer 34 (*)    GFR calc Af Amer 39 (*)    All other components within normal limits  CBC WITH DIFFERENTIAL/PLATELET  URINALYSIS, ROUTINE W REFLEX MICROSCOPIC    EKG EKG Interpretation  Date/Time:  Saturday January 28 2019 18:36:51 EST Ventricular Rate:  65 PR Interval:    QRS Duration: 96 QT Interval:  438 QTC Calculation: 456 R Axis:   -52 Text Interpretation: Atrial-paced complexes Prolonged PR interval Left anterior fascicular block Abnormal R-wave progression, late transition ST elevation, consider inferior injury since last tracing no significant change Confirmed by Malvin Johns [phone]) on 01/28/2019 7:20:19 PM   Radiology Ct Head Wo Contrast  Result Date: 01/28/2019 CLINICAL DATA:  Headache EXAM: CT HEAD WITHOUT CONTRAST TECHNIQUE: Contiguous axial images were obtained from the base of the skull through the vertex without intravenous contrast. COMPARISON:  10/11/2018 FINDINGS: Brain: There is atrophy and chronic small vessel disease changes. Area of low-density in the right cerebellar hemisphere most compatible with chronic infarct. This is new since prior study. No hemorrhage or hydrocephalus. Vascular: No hyperdense vessel or unexpected calcification. Skull: No acute calvarial abnormality. Sinuses/Orbits: Mucosal thickening throughout the paranasal sinuses. Other: None IMPRESSION: Atrophy, chronic microvascular disease. 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 This is new since prior study. Electronically Signed   By: Rolm Baptise M.D.   On: 01/28/2019 20:20    Procedures Procedures (including critical care time)  Medications Ordered in ED Medications  sodium chloride 0.9 % bolus 500 mL (500 mLs Intravenous New Bag/Given 01/28/19 2044)  acetaminophen (TYLENOL) tablet 650 mg (650 mg Oral Given 01/28/19 2101)      Initial Impression / Assessment and Plan / ED Course  I have reviewed the triage vital signs and the nursing notes.  Pertinent labs & imaging results that were available during my care of the patient were reviewed by me and considered in  my medical decision making (see chart for details).        Patient presents with reported headache.  I initially was not able to get in contact with family.  I was able to eventually contact the number for the patient's daughter and found out that the patient's daughter has passed away and patient is now living with her granddaughter.  Her number is [phone].  She had limited information but said that she felt the patient was weak and having trouble walking.  She just noticed this today when she woke up.  She thought the patient was not feeling very well.  There was not any specific complaints.  She did have a little bit of a fall around lunchtime when reportedly she was trying to sit down in a chair and missed the chair and fell down onto her buttocks area.  On my exam she does not have any pain in her pelvis or sacrum.  No buttocks pain or spinal tenderness.  I do not find any focal neurologic deficits.  We did get her up to ambulate and initially she felt a little dizzy on sitting up.  After she sat on the side of the bed for a while she was able to ambulate with a walker at a fast rate of speed.  She does not appear to have any difficulty with her balance when she uses a walker which reportedly she uses at home.  Her head CT does not show any acute deficits.  Her labs are nonconcerning.  She was given some IV fluids given that she was a little dizzy on initial sitting up on the side of the bed.  Awaiting fluids and u/a results.  Care turned over to Dr. Rex Kras.  Final Clinical Impressions(s) / ED Diagnoses   Final diagnoses:  Nonintractable headache, unspecified chronicity pattern, unspecified headache type  Lightheadedness    ED Discharge Orders    None        Malvin Johns, MD 01/28/19 2109

## 2019-01-28 NOTE — ED Notes (Signed)
[facility] called to transport

## 2019-01-28 NOTE — ED Notes (Signed)
Pt. Ambulated with walker at a quick pace, took 3-5 min's to get going, felt dizziness when sitting up in bed from laying flat for some time, helped to get her up onto her feet, then she walked at a fast pace, head first bent over posture to the bath room and back. Gave urine sample also.

## 2019-01-28 NOTE — ED Notes (Signed)
Patient granddaughter calling Alverda Skeans asking for a call back [phone]  Would like an update on patient

## 2019-01-30 ENCOUNTER — Emergency Department (HOSPITAL_COMMUNITY)
Admission: EM | Admit: 2019-01-30 | Discharge: 2019-01-31 | Disposition: A | Discharge status: Home / Self Care | Payer: Medicare Other | Attending: Emergency Medicine | Admitting: Emergency Medicine

## 2019-01-30 ENCOUNTER — Other Ambulatory Visit: Payer: Self-pay

## 2019-01-30 DIAGNOSIS — Z950 Presence of cardiac pacemaker: Secondary | ICD-10-CM | POA: Diagnosis not present

## 2019-01-30 DIAGNOSIS — I129 Hypertensive chronic kidney disease with stage 1 through stage 4 chronic kidney disease, or unspecified chronic kidney disease: Secondary | ICD-10-CM | POA: Insufficient documentation

## 2019-01-30 DIAGNOSIS — E1122 Type 2 diabetes mellitus with diabetic chronic kidney disease: Secondary | ICD-10-CM | POA: Insufficient documentation

## 2019-01-30 DIAGNOSIS — Z79899 Other long term (current) drug therapy: Secondary | ICD-10-CM | POA: Diagnosis not present

## 2019-01-30 DIAGNOSIS — E1140 Type 2 diabetes mellitus with diabetic neuropathy, unspecified: Secondary | ICD-10-CM | POA: Diagnosis not present

## 2019-01-30 DIAGNOSIS — Z87891 Personal history of nicotine dependence: Secondary | ICD-10-CM | POA: Diagnosis not present

## 2019-01-30 DIAGNOSIS — J449 Chronic obstructive pulmonary disease, unspecified: Secondary | ICD-10-CM | POA: Diagnosis not present

## 2019-01-30 DIAGNOSIS — F0280 Dementia in other diseases classified elsewhere without behavioral disturbance: Secondary | ICD-10-CM | POA: Diagnosis not present

## 2019-01-30 DIAGNOSIS — R519 Headache, unspecified: Secondary | ICD-10-CM | POA: Diagnosis present

## 2019-01-30 DIAGNOSIS — N183 Chronic kidney disease, stage 3 unspecified: Secondary | ICD-10-CM | POA: Insufficient documentation

## 2019-01-30 DIAGNOSIS — G300 Alzheimer's disease with early onset: Secondary | ICD-10-CM | POA: Diagnosis not present

## 2019-01-30 NOTE — ED Triage Notes (Signed)
Per [facility], Pt endorses ha since this morning. No neuro deficits. Has hx of dementia and is at baseline. Here 2 days ago for same, ha went away and came back. VSS.

## 2019-01-31 NOTE — Discharge Instructions (Signed)
A copy of your CT was provided to you at discharge.  Please follow up with your Primary care physician as needed.  You may take Tylenol to help with your headache as needed.

## 2019-01-31 NOTE — ED Notes (Signed)
Called [facility] for pt transport  

## 2019-01-31 NOTE — ED Notes (Signed)
Pt's granddaughter, [name], notified that pt would be discharged and sent home via [facility]

## 2019-01-31 NOTE — ED Notes (Signed)
Patient verbalizes understanding of discharge instructions. Opportunity for questioning and answers were provided. Pt discharged from ED. 

## 2019-01-31 NOTE — ED Provider Notes (Signed)
[facility] EMERGENCY DEPARTMENT Provider Note   CSN: [id number] Arrival date & time: 01/30/19  1751     History   Chief Complaint Chief Complaint  Patient presents with  . Headache    HPI [name] is a 85 y.o. female.     85 y.o female with a PMH of CKD, HTN, DM presents to the ED sent in by granddaughter.  According to EMS report granddaughter stated that patient has been having a headache for the past couple days.  Patient does have a history of dementia.  She does report that she has no headache on today's visit, she is unsure why she is here. Level 5 caveat  The history is provided by the patient and a relative.  Headache   Past Medical History:  Diagnosis Date  . Asthma   . CKD (chronic kidney disease) stage 3, GFR 30-59 ml/min 05/01/2018  . Colostomy present ([place])    hx/notes 07/17/2010  . Dementia ([place])   . Diabetic neuropathy ([place])   . Hypertension    hx/notes 07/17/2010  . Pneumonia    recent/notes 10/08/2016  . Symptomatic bradycardia 12/15/2017  . Type II diabetes mellitus ([place])    hx/notes 07/17/2010    Patient Active Problem List   Diagnosis Date Noted  . Symptomatic bradycardia 05/01/2018  . Near syncope 05/01/2018  . CKD (chronic kidney disease) stage 3, GFR 30-59 ml/min 05/01/2018  . Influenza B 04/10/2018  . Generalized weakness 04/10/2018  . Essential hypertension 03/01/2018  . COPD (chronic obstructive pulmonary disease) ([place]) 03/01/2018  . Bradycardia 12/15/2017  . Colostomy present ([place]) 11/08/2016  . Syncope 10/08/2016  . Acute kidney injury ([place]) 10/08/2016  . Abnormal chest x-ray 10/08/2016  . Lethargy 10/08/2016  . Dementia ([place])   . Sepsis ([place]) 09/05/2016  . Community acquired pneumonia 09/05/2016  . Toxic metabolic encephalopathy [id number]  . Diabetes mellitus with complication ([place]) Q000111Q  . Nausea & vomiting 04/09/2012  . Gallstones 03/15/2011  . Partial SBO 03/15/2011  . Colostomy hernia ([place])  03/15/2011  . PSEUDOGOUT 02/28/2007  . IRON DEFIC ANEMIA [place] DIET IRON INTAKE 02/28/2007  . DISCITIS 02/28/2007  . OSTEOMYELITIS 02/28/2007  . CHILLS WITHOUT FEVER 02/28/2007    Past Surgical History:  Procedure Laterality Date  . ABDOMINAL HYSTERECTOMY    . COLOSTOMY     S/P SBO hx/notes 07/17/2010  . LUMBAR FUSION  11/2006   hx/notes 07/17/2010  . NASAL SINUS SURGERY  04/28/2002   Bilateral inferior turbinate reductions, bilateral maxillary antrotomies, bilateral total ethmoidectomies, bilateral frontal recess explorations, bilateral sphenoidotomies, Instatrac guidance./notes 07/28/2010  . PACEMAKER IMPLANT N/A 05/02/2018   Procedure: PACEMAKER IMPLANT;  Surgeon: Evans Lance, MD;  Location: [facility];  Service: Cardiovascular;  Laterality: N/A;     OB History   No obstetric history on file.      Home Medications    Prior to Admission medications   Medication Sig Start Date End Date Taking? Authorizing Provider  acetaminophen (TYLENOL) 325 MG tablet Take 650 mg by mouth every 6 (six) hours as needed for headache (pain).    [provider]  albuterol (PROVENTIL HFA;VENTOLIN HFA) 108 (90 Base) MCG/ACT inhaler Inhale 1-2 puffs into the lungs every 4 (four) hours as needed for wheezing or shortness of breath.     [provider]  alendronate (FOSAMAX) 70 MG tablet Take 70 mg by mouth every Saturday.     [provider]  busPIRone (BUSPAR) 5 MG tablet Take  5 mg by mouth 3 (three) times daily. 11/24/17   [provider]  Calcium Citrate-Vitamin D (CALCIUM CITRATE + D) 315-250 MG-UNIT TABS Take 1 tablet by mouth daily.    [provider]  docusate sodium (COLACE) 100 MG capsule Take 1 capsule (100 mg total) by mouth 2 (two) times daily. 05/03/18   Lavina Hamman, MD  donepezil (ARICEPT) 10 MG tablet Take 10 mg by mouth at bedtime.  02/24/15   [provider]  escitalopram (LEXAPRO) 10 MG tablet Take 10 mg by mouth at bedtime.  06/16/17   [provider]  FLOVENT HFA 220 MCG/ACT inhaler Inhale 1 puff into the lungs 2 (two) times daily. 05/31/18   Mannam, Hart Robinsons, MD  gabapentin (NEURONTIN) 300 MG capsule Take 300 mg by mouth 3 (three) times daily. 10/12/15   [provider]  losartan (COZAAR) 100 MG tablet Take 0.5 tablets (50 mg total) by mouth daily. 04/11/18   Nita Sells, MD  memantine (NAMENDA XR) 28 MG CP24 24 hr capsule Take 28 mg by mouth daily.    [provider]  montelukast (SINGULAIR) 10 MG tablet Take 10 mg by mouth at bedtime.  06/21/17   [provider]  predniSONE (DELTASONE) 10 MG tablet Take 4 tabs po daily x 2 days; then 3 tabs for 2 days; then 2 tabs for 2 days; then 1 tab for 2 days 08/01/18   Martyn Ehrich, NP    Family History Family History  Problem Relation Age of Onset  . Kidney disease Mother   . Hypertension Mother   . Other Father        gangrene with amputation  . Diabetes Sister   . Cancer Brother   . Heart disease Brother   . Schizophrenia Daughter   . Diabetes Daughter   . Stroke Daughter   . Diabetes Sister   . Breast cancer Neg Hx     Social History Social History   Tobacco Use  . Smoking status: Never Smoker  . Smokeless tobacco: Former Systems developer    Types: Chew  Substance Use Topics  . Alcohol use: No  . Drug use: No     Allergies   Aspirin   Review of Systems Review of Systems  Unable to perform ROS: Dementia  Neurological: Positive for headaches.     Physical Exam Updated Vital Signs BP 136/82 (BP Location: Left Arm)   Pulse (!) 59   Temp 98.4 F (36.9 C) (Oral)   Resp 16   SpO2 98%   Physical Exam Vitals signs and nursing note reviewed.  Constitutional:      Appearance: She is well-developed.  HENT:     Head: Normocephalic and atraumatic.  Neck:     Musculoskeletal: Normal range of motion and neck supple.     Comments: No neck rigidity.  Cardiovascular:     Rate and Rhythm: Normal rate.   Pulmonary:     Effort: Pulmonary effort is normal.     Breath sounds: Normal breath sounds.  Abdominal:     Palpations: Abdomen is soft.  Skin:    General: Skin is warm and dry.  Neurological:     Mental Status: Mental status is at baseline.     Cranial Nerves: No cranial nerve deficit, dysarthria or facial asymmetry.     Comments: Alert and oriented to self, place but no time.       ED Treatments / Results  Labs (all labs ordered are listed, but only  abnormal results are displayed) Labs Reviewed - No data to display  EKG None  Radiology No results found.  Procedures Procedures (including critical care time)  Medications Ordered in ED Medications - No data to display   Initial Impression / Assessment and Plan / ED Course  I have reviewed the triage vital signs and the nursing notes.  Pertinent labs & imaging results that were available during my care of the patient were reviewed by me and considered in my medical decision making (see chart for details).   Patient with a past medical history of dementia presents to the ED with complaints of headache.  Patient was evaluated by me after being in the department for 13 hours, she reports she does not have any headache and then improved after her arrival in the ED.  Patient does have a previous visit in the ED from 3 days ago, she had an unremarkable work-up with a CT without any acute abnormality.  Collateral information was obtained from granddaughter [name] at [phone]  Who reported patient came home from hospital who with headache, she has been less active without wanting to get out of bed daily.   CMP from 3 days ago without any electrolyte abnormality.  CBC without any leukocytosis.  She does not have any urinary symptoms on my exam, UA from 11/14 showed moderate blood, nitrites, leukocytes, white blood cell count.  Her vitals have been stable during her visit.  Patient stated multiple times she is ready to go back  home, daughter [name] was advised that patient will likely return home today, she is unable to pick her up.  Did request a printout of her CT report which I will provide with patient discharge papers. She does not have any fever, no neck rigidity, low suspicion for meningitis.  Patient is otherwise well-appearing, without any headache.  Will ambulate patient prior to disposition.  I have discussed patient with Dr. Sherry Ruffing who has seen patient and agrees with management.   Portions of this note were generated with Lobby[employer]. Dictation errors may occur despite best attempts at proofreading.  Final Clinical Impressions(s) / ED Diagnoses   Final diagnoses:  Early onset Alzheimer's dementia without behavioral disturbance ([place])  Nonintractable episodic headache, unspecified headache type    ED Discharge Orders    None       Janeece Fitting, PA-C 01/31/19 1010    Tegeler, Gwenyth Allegra, MD 01/31/19 [phone]

## 2019-02-01 ENCOUNTER — Encounter: Payer: Medicare Other | Admitting: *Deleted

## 2019-02-24 ENCOUNTER — Ambulatory Visit: Payer: Self-pay | Admitting: *Deleted

## 2019-02-27 ENCOUNTER — Ambulatory Visit: Payer: Self-pay | Admitting: *Deleted

## 2019-03-13 ENCOUNTER — Ambulatory Visit (INDEPENDENT_AMBULATORY_CARE_PROVIDER_SITE_OTHER): Payer: Medicare Other | Admitting: Otolaryngology

## 2019-03-15 ENCOUNTER — Ambulatory Visit (INDEPENDENT_AMBULATORY_CARE_PROVIDER_SITE_OTHER): Payer: Medicare Other | Admitting: Otolaryngology

## 2019-03-21 ENCOUNTER — Other Ambulatory Visit: Payer: Self-pay | Admitting: *Deleted

## 2019-03-21 NOTE — Patient Outreach (Signed)
[facility] Care Management  03/21/2019  [name] [birth year] [id number]   RN Health Coach attempted follow up outreach call to patient.  Patient was unavailable. No voicemail pickup. Plan: RN will call patient again within 30 days.  [facility] [phone]

## 2019-04-01 ENCOUNTER — Encounter (HOSPITAL_COMMUNITY): Payer: Self-pay

## 2019-04-01 ENCOUNTER — Other Ambulatory Visit: Payer: Self-pay

## 2019-04-01 DIAGNOSIS — N3000 Acute cystitis without hematuria: Secondary | ICD-10-CM | POA: Insufficient documentation

## 2019-04-01 DIAGNOSIS — Z79899 Other long term (current) drug therapy: Secondary | ICD-10-CM | POA: Diagnosis not present

## 2019-04-01 DIAGNOSIS — Z7984 Long term (current) use of oral hypoglycemic drugs: Secondary | ICD-10-CM | POA: Insufficient documentation

## 2019-04-01 DIAGNOSIS — F0390 Unspecified dementia without behavioral disturbance: Secondary | ICD-10-CM | POA: Diagnosis not present

## 2019-04-01 DIAGNOSIS — R262 Difficulty in walking, not elsewhere classified: Secondary | ICD-10-CM | POA: Insufficient documentation

## 2019-04-01 DIAGNOSIS — E1122 Type 2 diabetes mellitus with diabetic chronic kidney disease: Secondary | ICD-10-CM | POA: Insufficient documentation

## 2019-04-01 DIAGNOSIS — J449 Chronic obstructive pulmonary disease, unspecified: Secondary | ICD-10-CM | POA: Diagnosis not present

## 2019-04-01 DIAGNOSIS — R2241 Localized swelling, mass and lump, right lower limb: Secondary | ICD-10-CM | POA: Insufficient documentation

## 2019-04-01 DIAGNOSIS — I129 Hypertensive chronic kidney disease with stage 1 through stage 4 chronic kidney disease, or unspecified chronic kidney disease: Secondary | ICD-10-CM | POA: Diagnosis not present

## 2019-04-01 DIAGNOSIS — Z950 Presence of cardiac pacemaker: Secondary | ICD-10-CM | POA: Diagnosis not present

## 2019-04-01 DIAGNOSIS — R531 Weakness: Secondary | ICD-10-CM | POA: Diagnosis present

## 2019-04-01 DIAGNOSIS — N183 Chronic kidney disease, stage 3 unspecified: Secondary | ICD-10-CM | POA: Insufficient documentation

## 2019-04-01 LAB — BASIC METABOLIC PANEL
Anion gap: 12 (ref 5–15)
BUN: 32 mg/dL — ABNORMAL HIGH (ref 8–23)
CO2: 22 mmol/L (ref 22–32)
Calcium: 9.1 mg/dL (ref 8.9–10.3)
Chloride: 108 mmol/L (ref 98–111)
Creatinine, Ser: 1.61 mg/dL — ABNORMAL HIGH (ref 0.44–1.00)
GFR calc Af Amer: 34 mL/min — ABNORMAL LOW (ref >60)
GFR calc non Af Amer: 29 mL/min — ABNORMAL LOW (ref >60)
Glucose, Bld: 143 mg/dL — ABNORMAL HIGH (ref 70–99)
Potassium: 5.2 mmol/L — ABNORMAL HIGH (ref 3.5–5.1)
Sodium: 142 mmol/L (ref 135–145)

## 2019-04-01 LAB — CBG MONITORING, ED: Glucose-Capillary: 124 mg/dL — ABNORMAL HIGH (ref 70–99)

## 2019-04-01 MED ORDER — SODIUM CHLORIDE 0.9% FLUSH
3.0000 mL | Freq: Once | INTRAVENOUS | Status: AC
  Administered 2019-04-02: 3 mL via INTRAVENOUS

## 2019-04-01 NOTE — ED Triage Notes (Signed)
Pt BIB [facility] from home. She reports increasing generalized weakness over the last 2 weeks, especially the last 3 days, mostly in her legs. Endorses decreased urination and foul smelling urine. Recently changed her BP medication. VSS. Denies chest pain, dizziness or SOB.

## 2019-04-02 ENCOUNTER — Emergency Department (HOSPITAL_COMMUNITY): Payer: Medicare Other

## 2019-04-02 ENCOUNTER — Other Ambulatory Visit: Payer: Self-pay

## 2019-04-02 ENCOUNTER — Emergency Department (HOSPITAL_COMMUNITY)
Admission: EM | Admit: 2019-04-02 | Discharge: 2019-04-02 | Disposition: A | Discharge status: Home / Self Care | Payer: Medicare Other | Attending: Emergency Medicine | Admitting: Emergency Medicine

## 2019-04-02 ENCOUNTER — Emergency Department (HOSPITAL_BASED_OUTPATIENT_CLINIC_OR_DEPARTMENT_OTHER): Payer: Medicare Other

## 2019-04-02 DIAGNOSIS — N3000 Acute cystitis without hematuria: Secondary | ICD-10-CM

## 2019-04-02 LAB — URINALYSIS, ROUTINE W REFLEX MICROSCOPIC
Bilirubin Urine: NEGATIVE
Glucose, UA: NEGATIVE mg/dL
Hgb urine dipstick: NEGATIVE
Ketones, ur: NEGATIVE mg/dL
Nitrite: NEGATIVE
Protein, ur: NEGATIVE mg/dL
Specific Gravity, Urine: 1.02 (ref 1.005–1.030)
WBC, UA: >50 WBC/hpf — ABNORMAL HIGH (ref 0–5)
pH: 5 (ref 5.0–8.0)

## 2019-04-02 LAB — CBC WITH DIFFERENTIAL/PLATELET
Abs Immature Granulocytes: 0.02 10*3/uL (ref 0.00–0.07)
Basophils Absolute: 0 10*3/uL (ref 0.0–0.1)
Basophils Relative: 1 %
Eosinophils Absolute: 0.4 10*3/uL (ref 0.0–0.5)
Eosinophils Relative: 6 %
HCT: 36.5 % (ref 36.0–46.0)
Hemoglobin: 11.1 g/dL — ABNORMAL LOW (ref 12.0–15.0)
Immature Granulocytes: 0 %
Lymphocytes Relative: 34 %
Lymphs Abs: 2.1 10*3/uL (ref 0.7–4.0)
MCH: 30.1 pg (ref 26.0–34.0)
MCHC: 30.4 g/dL (ref 30.0–36.0)
MCV: 98.9 fL (ref 80.0–100.0)
Monocytes Absolute: 0.5 10*3/uL (ref 0.1–1.0)
Monocytes Relative: 9 %
Neutro Abs: 3.2 10*3/uL (ref 1.7–7.7)
Neutrophils Relative %: 50 %
Platelets: 150 10*3/uL (ref 150–400)
RBC: 3.69 MIL/uL — ABNORMAL LOW (ref 3.87–5.11)
RDW: 13.6 % (ref 11.5–15.5)
WBC: 6.2 10*3/uL (ref 4.0–10.5)
nRBC: 0 % (ref 0.0–0.2)

## 2019-04-02 LAB — TROPONIN I (HIGH SENSITIVITY)
Troponin I (High Sensitivity): 2 ng/L (ref <18)
Troponin I (High Sensitivity): 4 ng/L (ref <18)

## 2019-04-02 MED ORDER — CEPHALEXIN 500 MG PO CAPS: 500.0000 mg | ORAL_CAPSULE | Freq: Two times a day (BID) | ORAL | 0 refills | Status: DC

## 2019-04-02 MED ORDER — SODIUM CHLORIDE 0.9 % IV BOLUS
500.0000 mL | Freq: Once | INTRAVENOUS | Status: AC
  Administered 2019-04-02: 500 mL via INTRAVENOUS

## 2019-04-02 MED ORDER — CEPHALEXIN 500 MG PO CAPS: 500.0000 mg | ORAL_CAPSULE | Freq: Two times a day (BID) | ORAL | 0 refills | Status: AC

## 2019-04-02 MED ORDER — CEPHALEXIN 500 MG PO CAPS: 500.0000 mg | ORAL_CAPSULE | Freq: Three times a day (TID) | ORAL | 0 refills | Status: DC

## 2019-04-02 NOTE — ED Notes (Signed)
[name] [phone] please call with update.

## 2019-04-02 NOTE — ED Provider Notes (Signed)
[facility] DEPT Provider Note   CSN: [id number] Arrival date & time: 04/01/19  2002     History Chief Complaint  Patient presents with  . Weakness    [name] is a 86 y.o. female PMH significant for type II DM, CKD, HTN, asthma, and dementia who presents to the ED via EMS with complaints of increasing generalized weakness and foul-smelling urine.  Patient lives at home with her granddaughter [name] who helps with her medications. I obtained history from [place] who reports that her blood pressure medication was lowered several months ago, but is unrelated to her 2-week history of generalized weakness, strong odor to her urine, and multiple falls without any significant injury or LOC.  Evidently patient uses her walker, but has been having much more difficulty ambulating in recent weeks.  She is particularly weak with standing up.  Granddaughter also suggest that she has been mildly delirious.  She denies any significant deviation from baseline, pain or injury, chest pain or difficulty breathing, recent illness, fevers or chills, nausea or vomiting, or changes in bowel habits.   HPI     Past Medical History:  Diagnosis Date  . Asthma   . CKD (chronic kidney disease) stage 3, GFR 30-59 ml/min 05/01/2018  . Colostomy present ([place])    hx/notes 07/17/2010  . Dementia ([place])   . Diabetic neuropathy ([place])   . Hypertension    hx/notes 07/17/2010  . Pneumonia    recent/notes 10/08/2016  . Symptomatic bradycardia 12/15/2017  . Type II diabetes mellitus ([place])    hx/notes 07/17/2010    Patient Active Problem List   Diagnosis Date Noted  . Symptomatic bradycardia 05/01/2018  . Near syncope 05/01/2018  . CKD (chronic kidney disease) stage 3, GFR 30-59 ml/min 05/01/2018  . Influenza B 04/10/2018  . Generalized weakness 04/10/2018  . Essential hypertension 03/01/2018  . COPD (chronic obstructive pulmonary disease) ([place]) 03/01/2018  . Bradycardia 12/15/2017  .  Colostomy present ([place]) 11/08/2016  . Syncope 10/08/2016  . Acute kidney injury ([place]) 10/08/2016  . Abnormal chest x-ray 10/08/2016  . Lethargy 10/08/2016  . Dementia ([place])   . Sepsis ([place]) 09/05/2016  . Community acquired pneumonia 09/05/2016  . Toxic metabolic encephalopathy [id number]  . Diabetes mellitus with complication ([place]) [id number]  . Nausea & vomiting 04/09/2012  . Gallstones 03/15/2011  . Partial SBO 03/15/2011  . Colostomy hernia ([place]) 03/15/2011  . PSEUDOGOUT 02/28/2007  . IRON DEFIC ANEMIA [place] DIET IRON INTAKE 02/28/2007  . DISCITIS 02/28/2007  . OSTEOMYELITIS 02/28/2007  . CHILLS WITHOUT FEVER 02/28/2007    Past Surgical History:  Procedure Laterality Date  . ABDOMINAL HYSTERECTOMY    . COLOSTOMY     S/P SBO hx/notes 07/17/2010  . LUMBAR FUSION  11/2006   hx/notes 07/17/2010  . NASAL SINUS SURGERY  04/28/2002   Bilateral inferior turbinate reductions, bilateral maxillary antrotomies, bilateral total ethmoidectomies, bilateral frontal recess explorations, bilateral sphenoidotomies, Instatrac guidance./notes 07/28/2010  . PACEMAKER IMPLANT N/A 05/02/2018   Procedure: PACEMAKER IMPLANT;  Surgeon: Evans Lance, MD;  Location: [facility];  Service: Cardiovascular;  Laterality: N/A;     OB History   No obstetric history on file.     Family History  Problem Relation Age of Onset  . Kidney disease Mother   . Hypertension Mother   . Other Father        gangrene with amputation  . Diabetes Sister   . Cancer Brother   . Heart disease Brother   .  Schizophrenia Daughter   . Diabetes Daughter   . Stroke Daughter   . Diabetes Sister   . Breast cancer Neg Hx     Social History   Tobacco Use  . Smoking status: Never Smoker  . Smokeless tobacco: Former Systems developer    Types: Chew  Substance Use Topics  . Alcohol use: No  . Drug use: No    Home Medications Prior to Admission medications   Medication Sig Start Date End Date Taking? Authorizing Provider    acetaminophen (TYLENOL) 325 MG tablet Take 650 mg by mouth every 6 (six) hours as needed for headache (pain).   Yes [provider]  albuterol (PROVENTIL HFA;VENTOLIN HFA) 108 (90 Base) MCG/ACT inhaler Inhale 1-2 puffs into the lungs every 4 (four) hours as needed for wheezing or shortness of breath.    Yes [provider]  alendronate (FOSAMAX) 70 MG tablet Take 70 mg by mouth every Saturday.    Yes [provider]  busPIRone (BUSPAR) 5 MG tablet Take 5 mg by mouth 3 (three) times daily. 11/24/17  Yes [provider]  Calcium Citrate-Vitamin D (CALCIUM CITRATE + D) 315-250 MG-UNIT TABS Take 1 tablet by mouth daily.   Yes [provider]  docusate sodium (COLACE) 100 MG capsule Take 1 capsule (100 mg total) by mouth 2 (two) times daily. Patient taking differently: Take 100 mg by mouth 2 (two) times daily as needed for mild constipation.  05/03/18  Yes Lavina Hamman, MD  donepezil (ARICEPT) 10 MG tablet Take 10 mg by mouth at bedtime.  02/24/15  Yes [provider]  escitalopram (LEXAPRO) 10 MG tablet Take 10 mg by mouth at bedtime. 06/16/17  Yes [provider]  FLOVENT HFA 220 MCG/ACT inhaler Inhale 1 puff into the lungs 2 (two) times daily. 05/31/18  Yes Mannam, Praveen, MD  gabapentin (NEURONTIN) 300 MG capsule Take 300 mg by mouth 3 (three) times daily. 10/12/15  Yes [provider]  losartan (COZAAR) 50 MG tablet Take 50 mg by mouth daily. 03/23/19  Yes [provider]  memantine (NAMENDA XR) 28 MG CP24 24 hr capsule Take 28 mg by mouth daily.   Yes [provider]  metFORMIN (GLUCOPHAGE-XR) 500 MG 24 hr tablet Take 500 mg by mouth at bedtime. 03/24/19  Yes [provider]  montelukast (SINGULAIR) 10 MG tablet Take 10 mg by mouth at bedtime.  06/21/17  Yes [provider]  cephALEXin (KEFLEX) 500 MG capsule Take 1 capsule (500 mg total) by mouth 2 (two) times daily for 14 days. 04/02/19 04/16/19   Corena Herter, PA-C  predniSONE (DELTASONE) 10 MG tablet Take 4 tabs po daily x 2 days; then 3 tabs for 2 days; then 2 tabs for 2 days; then 1 tab for 2 days Patient not taking: Reported on 04/02/2019 08/01/18   Martyn Ehrich, NP    Allergies    Aspirin  Review of Systems   Review of Systems  All other systems reviewed and are negative.   Physical Exam Updated Vital Signs BP (!) 174/79   Pulse 60   Temp 97.9 F (36.6 C) (Oral)   Resp 19   Ht 5\' 2"  (1.575 m)   Wt 72.6 kg   SpO2 99%   BMI 29.26 kg/m   Physical Exam Vitals and nursing note reviewed. Exam conducted with a chaperone present.  Constitutional:      Appearance: Normal appearance.  HENT:     Head: Normocephalic and atraumatic.  Eyes:     General: No scleral icterus.    Conjunctiva/sclera: Conjunctivae normal.  Cardiovascular:     Rate and Rhythm: Normal rate and regular rhythm.     Pulses: Normal pulses.     Heart sounds: Normal heart sounds.  Pulmonary:     Effort: Pulmonary effort is normal.     Breath sounds: Normal breath sounds.  Abdominal:     General: Abdomen is flat. There is no distension.     Tenderness: There is no abdominal tenderness. There is no guarding.  Musculoskeletal:     Cervical back: Normal range of motion and neck supple.     Comments: Right lower extremity appears swollen compared to left.  Distal sensation and pulses intact.  Cap refill intact.  Skin:    General: Skin is dry.     Capillary Refill: Capillary refill takes less than 2 seconds.  Neurological:     General: No focal deficit present.     Mental Status: She is alert.     GCS: GCS eye subscore is 4. GCS verbal subscore is 5. GCS motor subscore is 6.     Cranial Nerves: No cranial nerve deficit.     Sensory: No sensory deficit.  Psychiatric:        Mood and Affect: Mood normal.        Behavior: Behavior normal.        Thought Content: Thought content normal.     ED Results / Procedures / Treatments    Labs (all labs ordered are listed, but only abnormal results are displayed) Labs Reviewed  BASIC METABOLIC PANEL - Abnormal; Notable for the following components:      Result Value   Potassium 5.2 (*)    Glucose, Bld 143 (*)    BUN 32 (*)    Creatinine, Ser 1.61 (*)    GFR calc non Af Amer 29 (*)    GFR calc Af Amer 34 (*)    All other components within normal limits  CBC WITH DIFFERENTIAL/PLATELET - Abnormal; Notable for the following components:   RBC 3.69 (*)    Hemoglobin 11.1 (*)    All other components within normal limits  URINALYSIS, ROUTINE W REFLEX MICROSCOPIC - Abnormal; Notable for the following components:   APPearance HAZY (*)    Leukocytes,Ua LARGE (*)    WBC, UA >50 (*)    Bacteria, UA RARE (*)    All other components within normal limits  CBG MONITORING, ED - Abnormal; Notable for the following components:   Glucose-Capillary 124 (*)    All other components within normal limits  TROPONIN I (HIGH SENSITIVITY)  TROPONIN I (HIGH SENSITIVITY)    EKG None  Radiology DG Ankle Complete Right  Result Date: 04/02/2019 CLINICAL DATA:  Right ankle pain and swelling EXAM: RIGHT ANKLE - COMPLETE 3+ VIEW COMPARISON:  None. FINDINGS: There is no evidence of fracture, dislocation, or joint effusion. Ankle mortise is congruent. Degenerative changes of the ankle joint with chondrocalcinosis and degenerative spurring in the medial greater than lateral gutters. Mild diffuse soft tissue prominence. Vascular calcifications. IMPRESSION: 1. No acute osseous abnormality, right ankle. 2. Degenerative changes and nonspecific soft tissue swelling. Electronically Signed   By: Davina Poke D.O.   On: 04/02/2019 10:01   DG Chest Portable 1 View  Result Date: 04/02/2019 CLINICAL DATA:  Increasing generalized weakness over the last 2 weeks, specially the left 3 days, mostly in her legs. EXAM: PORTABLE CHEST 1 VIEW COMPARISON:  One-view  chest x-ray 05/06/2018 FINDINGS: The heart is  enlarged. Pacing wires are stable. There is no edema or effusion. No focal airspace disease is present. Degenerative changes are again noted at the shoulders and throughout the thoracic spine. IMPRESSION: Stable cardiomegaly without failure. Electronically Signed   By: San Morelle M.D.   On: 04/02/2019 07:03   VAS [place] LOWER EXTREMITY VENOUS (DVT) (ONLY [facility] & [facility])  Result Date: 04/02/2019  Lower Venous Study Comparison Study: no prior Performing Technologist: Abram Sander RVS  Examination Guidelines: A complete evaluation includes B-mode imaging, spectral Doppler, color Doppler, and power Doppler as needed of all accessible portions of each vessel. Bilateral testing is considered an integral part of a complete examination. Limited examinations for reoccurring indications may be performed as noted.  +---------+---------------+---------+-----------+----------+--------------+ RIGHT    CompressibilityPhasicitySpontaneityPropertiesThrombus Aging +---------+---------------+---------+-----------+----------+--------------+ CFV      Full           Yes      Yes                                 +---------+---------------+---------+-----------+----------+--------------+ SFJ      Full                                                        +---------+---------------+---------+-----------+----------+--------------+ FV Prox  Full                                                        +---------+---------------+---------+-----------+----------+--------------+ FV Mid   Full                                                        +---------+---------------+---------+-----------+----------+--------------+ FV DistalFull                                                        +---------+---------------+---------+-----------+----------+--------------+ PFV      Full                                                         +---------+---------------+---------+-----------+----------+--------------+ POP      Full           Yes      Yes                                 +---------+---------------+---------+-----------+----------+--------------+ PTV      Full                                                        +---------+---------------+---------+-----------+----------+--------------+  PERO                                                  Not visualized +---------+---------------+---------+-----------+----------+--------------+     Summary: Right: There is no evidence of deep vein thrombosis in the lower extremity. No cystic structure found in the popliteal fossa.  *See table(s) above for measurements and observations.    Preliminary     Procedures Procedures (including critical care time)  Medications Ordered in ED Medications  sodium chloride 0.9 % bolus 500 mL (has no administration in time range)  sodium chloride flush (NS) 0.9 % injection 3 mL (3 mLs Intravenous Given 04/02/19 1005)    ED Course  I have reviewed the triage vital signs and the nursing notes.  Pertinent labs & imaging results that were available during my care of the patient were reviewed by me and considered in my medical decision making (see chart for details).    MDM Rules/Calculators/A&P                      On reexamination, patient is endorsing mild right ankle discomfort.  There is swelling noted on my exam.  If negative, will obtain DVT study.  DG right ankle demonstrates no acute bone abnormalities.  Proceeded with DVT which demonstrated no evidence of a deep venous thrombosis.  Given patient's right lower extremity swelling and tenderness to palpation around ankle, suspect sprain and will place in ASO splint.  That might at least partially explain why she has had difficulty ambulating and has been more prone to falls.    She chest demonstrates stable cardiomegaly, but no acute cardiopulmonary abnormalities.  EKG  demonstrated normal sinus rhythm.  BMP demonstrated a very mild hyperkalemia and slightly worsened renal function, possibly attributed to dehydration and a prerenal azotemia.  Her CBC revealed only mild worsening of her hemoglobin to 11.1 and she denies any melena.  Troponin was normal at 4.  Her granddaughter had reported a particular odor to her urine.  Urinalysis was hazy and revealed large leukocytes, > 50 WBCs, and bacteria.  Will treat with Keflex x5 days.  No flank pain or reported fevers.  Patient is otherwise hemodynamically stable and in no acute distress.  Dr. Stark Jock personally evaluated patient and agrees with assessment and plan.  Given her recent falls and her granddaughter's expressed concern, placed face-to-face order for physical therapy and home health nursing to ensure that patient is in good hands at home.  Discussed with granddaughter who understands and is agreeable to the plan.  [name] would have preferred hospital admission but I explained why patient does not currently meet admission criteria.  Patient expressed desire to go home.     Final Clinical Impression(s) / ED Diagnoses Final diagnoses:  Acute cystitis without hematuria    Rx / DC Orders ED Discharge Orders         [place]     04/02/19 1442    Face-to-face encounter (required for Medicare/Medicaid patients)    Comments: I Krista Blue certify that this patient is under my care and that I, or a nurse practitioner or physician's assistant working with me, had a face-to-face encounter that meets the physician face-to-face encounter requirements with this patient on 04/02/2019. The encounter with the patient was in whole, or in part for  the following medical condition(s) which is the primary reason for home health care (List medical condition): Ambulatory dysfunction   04/02/19 1442    cephALEXin (KEFLEX) 500 MG capsule  3 times daily,   Status:  Discontinued     04/02/19 1442    cephALEXin (KEFLEX) 500  MG capsule  2 times daily,   Status:  Discontinued     04/02/19 1442    cephALEXin (KEFLEX) 500 MG capsule  2 times daily     04/02/19 1523           Corena Herter, PA-C 04/02/19 1524    Veryl Speak, MD 04/03/19 [phone]

## 2019-04-02 NOTE — Progress Notes (Signed)
Orthopedic Tech Progress Note Patient Details:  ELIZETH SPARLING 15-Aug-1933 [id number]  Ortho Devices Type of Ortho Device: ASO Ortho Device/Splint Location: RLE Ortho Device/Splint Interventions: Ordered, Application, Adjustment   Post Interventions Patient Tolerated: Well Instructions Provided: Care of device, Adjustment of device, Poper ambulation with device   Staci Righter 04/02/2019, 3:38 PM

## 2019-04-02 NOTE — ED Notes (Signed)
Patient states she comes in because she is not feeling good. Patient is not hurting anywhere but  Her right leg is bigger than her left leg. No edema.

## 2019-04-02 NOTE — ED Notes (Signed)
Spoke to patient granddaughter about patient coming home and explain to her where to pick up her medication. [name] verbalized understanding.

## 2019-04-02 NOTE — Discharge Instructions (Signed)
You have a urinary tract infection.  Please increase your oral hydration and take your antibiotics, as prescribed.    You also have some right lower leg swelling and tenderness to palpation over the ankle, concerning for a sprain in light of your recent falls.  We will place an ASO splint and encourage you to avoid weightbearing until your discomfort improves.  Please continue to use your assistive walking devices.  We have placed an order for physical therapy and home health come to your home for continued evaluation and management of your ambulatory dysfunction.    Please return to the ED or seek medical attention immediately for any new or worsening symptoms.

## 2019-04-02 NOTE — Progress Notes (Signed)
Lower extremity venous has been completed.   Preliminary results in CV Proc.   Abram Sander 04/02/2019 2:44 PM

## 2019-04-04 ENCOUNTER — Other Ambulatory Visit: Payer: Self-pay

## 2019-04-04 ENCOUNTER — Emergency Department (HOSPITAL_COMMUNITY)
Admission: EM | Admit: 2019-04-04 | Discharge: 2019-04-05 | Disposition: A | Discharge status: Home / Self Care | Payer: Medicare Other | Attending: Emergency Medicine | Admitting: Emergency Medicine

## 2019-04-04 ENCOUNTER — Encounter (HOSPITAL_COMMUNITY): Payer: Self-pay | Admitting: Emergency Medicine

## 2019-04-04 DIAGNOSIS — Z933 Colostomy status: Secondary | ICD-10-CM | POA: Insufficient documentation

## 2019-04-04 DIAGNOSIS — E1122 Type 2 diabetes mellitus with diabetic chronic kidney disease: Secondary | ICD-10-CM | POA: Diagnosis not present

## 2019-04-04 DIAGNOSIS — N183 Chronic kidney disease, stage 3 unspecified: Secondary | ICD-10-CM | POA: Diagnosis not present

## 2019-04-04 DIAGNOSIS — E860 Dehydration: Secondary | ICD-10-CM | POA: Diagnosis not present

## 2019-04-04 DIAGNOSIS — Y92009 Unspecified place in unspecified non-institutional (private) residence as the place of occurrence of the external cause: Secondary | ICD-10-CM | POA: Diagnosis not present

## 2019-04-04 DIAGNOSIS — Y999 Unspecified external cause status: Secondary | ICD-10-CM | POA: Insufficient documentation

## 2019-04-04 DIAGNOSIS — Y9389 Activity, other specified: Secondary | ICD-10-CM | POA: Insufficient documentation

## 2019-04-04 DIAGNOSIS — Z7984 Long term (current) use of oral hypoglycemic drugs: Secondary | ICD-10-CM | POA: Diagnosis not present

## 2019-04-04 DIAGNOSIS — Z950 Presence of cardiac pacemaker: Secondary | ICD-10-CM | POA: Insufficient documentation

## 2019-04-04 DIAGNOSIS — R296 Repeated falls: Secondary | ICD-10-CM | POA: Diagnosis not present

## 2019-04-04 DIAGNOSIS — J45909 Unspecified asthma, uncomplicated: Secondary | ICD-10-CM | POA: Diagnosis not present

## 2019-04-04 DIAGNOSIS — Z79899 Other long term (current) drug therapy: Secondary | ICD-10-CM | POA: Insufficient documentation

## 2019-04-04 DIAGNOSIS — W1789XA Other fall from one level to another, initial encounter: Secondary | ICD-10-CM | POA: Insufficient documentation

## 2019-04-04 DIAGNOSIS — W19XXXA Unspecified fall, initial encounter: Secondary | ICD-10-CM

## 2019-04-04 DIAGNOSIS — Z87891 Personal history of nicotine dependence: Secondary | ICD-10-CM | POA: Insufficient documentation

## 2019-04-04 DIAGNOSIS — F0390 Unspecified dementia without behavioral disturbance: Secondary | ICD-10-CM | POA: Diagnosis not present

## 2019-04-04 DIAGNOSIS — I129 Hypertensive chronic kidney disease with stage 1 through stage 4 chronic kidney disease, or unspecified chronic kidney disease: Secondary | ICD-10-CM | POA: Insufficient documentation

## 2019-04-04 DIAGNOSIS — R627 Adult failure to thrive: Secondary | ICD-10-CM | POA: Diagnosis present

## 2019-04-04 LAB — CBC
HCT: 38.9 % (ref 36.0–46.0)
Hemoglobin: 12 g/dL (ref 12.0–15.0)
MCH: 30.5 pg (ref 26.0–34.0)
MCHC: 30.8 g/dL (ref 30.0–36.0)
MCV: 99 fL (ref 80.0–100.0)
Platelets: 226 10*3/uL (ref 150–400)
RBC: 3.93 MIL/uL (ref 3.87–5.11)
RDW: 13.1 % (ref 11.5–15.5)
WBC: 7.5 10*3/uL (ref 4.0–10.5)
nRBC: 0 % (ref 0.0–0.2)

## 2019-04-04 LAB — BASIC METABOLIC PANEL
Anion gap: 9 (ref 5–15)
BUN: 21 mg/dL (ref 8–23)
CO2: 20 mmol/L — ABNORMAL LOW (ref 22–32)
Calcium: 9.3 mg/dL (ref 8.9–10.3)
Chloride: 107 mmol/L (ref 98–111)
Creatinine, Ser: 1.78 mg/dL — ABNORMAL HIGH (ref 0.44–1.00)
GFR calc Af Amer: 30 mL/min — ABNORMAL LOW (ref >60)
GFR calc non Af Amer: 26 mL/min — ABNORMAL LOW (ref >60)
Glucose, Bld: 129 mg/dL — ABNORMAL HIGH (ref 70–99)
Potassium: 5.1 mmol/L (ref 3.5–5.1)
Sodium: 136 mmol/L (ref 135–145)

## 2019-04-04 MED ORDER — SODIUM CHLORIDE 0.9 % IV BOLUS
1000.0000 mL | Freq: Once | INTRAVENOUS | Status: AC
  Administered 2019-04-04: 23:00:00 1000 mL via INTRAVENOUS

## 2019-04-04 NOTE — Discharge Instructions (Signed)
Continue your antibiotics for your urinary tract infection.  Keep yourself hydrated.  Follow-up with your home health caregivers as well as her PCP. Return to the ED with new or worsening symptoms.

## 2019-04-04 NOTE — ED Triage Notes (Signed)
Pt to triage via [facility] from home.  Pt fell in floor after she sat down and missed chair.  Denies injury.  Denies blood thinner.  Pt being treated for UTI.  Family request pt be evaluated for failure to thrive and possible nursing home placement due to increased falls over the last 6 weeks.

## 2019-04-04 NOTE — ED Provider Notes (Signed)
[place] EMERGENCY DEPARTMENT Provider Note   CSN: [id number] Arrival date & time: 04/04/19  1831     History Chief Complaint  Patient presents with  . Fall    [name] is a 86 y.o. female.  Level 5 caveat for dementia.  Patient brought in by EMS after having a fall at home.  She does not know what happened.  By report she went to sit down and missed the chair and fell onto her bottom.  She denies hitting her head or losing consciousness.  She denies feeling any preceding dizziness or lightheadedness.  She does not know what made her fall.  She denies any pain.  She is oriented to person and place in the month of January.  She is currently taking Keflex for a possible UTI.  She was seen at [facility] 2 days ago for generalized weakness.  She was sent home on antibiotics and arrangements for home health.  She uses a walker at home.  Urine culture was not sent.  There is no report of fever or vomiting.  Patient denies any head, neck, back, chest or abdominal pain.  No fever or vomiting.  The history is provided by the patient. The history is limited by the condition of the patient.  Fall       Past Medical History:  Diagnosis Date  . Asthma   . CKD (chronic kidney disease) stage 3, GFR 30-59 ml/min 05/01/2018  . Colostomy present ([place])    hx/notes 07/17/2010  . Dementia ([place])   . Diabetic neuropathy ([place])   . Hypertension    hx/notes 07/17/2010  . Pneumonia    recent/notes 10/08/2016  . Symptomatic bradycardia 12/15/2017  . Type II diabetes mellitus ([place])    hx/notes 07/17/2010    Patient Active Problem List   Diagnosis Date Noted  . Symptomatic bradycardia 05/01/2018  . Near syncope 05/01/2018  . CKD (chronic kidney disease) stage 3, GFR 30-59 ml/min 05/01/2018  . Influenza B 04/10/2018  . Generalized weakness 04/10/2018  . Essential hypertension 03/01/2018  . COPD (chronic obstructive pulmonary disease) ([place]) 03/01/2018  . Bradycardia  12/15/2017  . Colostomy present ([place]) 11/08/2016  . Syncope 10/08/2016  . Acute kidney injury ([place]) 10/08/2016  . Abnormal chest x-ray 10/08/2016  . Lethargy 10/08/2016  . Dementia ([place])   . Sepsis ([place]) 09/05/2016  . Community acquired pneumonia 09/05/2016  . Toxic metabolic encephalopathy [id number]  . Diabetes mellitus with complication ([place]) [id number]  . Nausea & vomiting 04/09/2012  . Gallstones 03/15/2011  . Partial SBO 03/15/2011  . Colostomy hernia ([place]) 03/15/2011  . PSEUDOGOUT 02/28/2007  . IRON DEFIC ANEMIA [place] DIET IRON INTAKE 02/28/2007  . DISCITIS 02/28/2007  . OSTEOMYELITIS 02/28/2007  . CHILLS WITHOUT FEVER 02/28/2007    Past Surgical History:  Procedure Laterality Date  . ABDOMINAL HYSTERECTOMY    . COLOSTOMY     S/P SBO hx/notes 07/17/2010  . LUMBAR FUSION  11/2006   hx/notes 07/17/2010  . NASAL SINUS SURGERY  04/28/2002   Bilateral inferior turbinate reductions, bilateral maxillary antrotomies, bilateral total ethmoidectomies, bilateral frontal recess explorations, bilateral sphenoidotomies, Instatrac guidance./notes 07/28/2010  . PACEMAKER IMPLANT N/A 05/02/2018   Procedure: PACEMAKER IMPLANT;  Surgeon: Evans Lance, MD;  Location: [facility];  Service: Cardiovascular;  Laterality: N/A;     OB History   No obstetric history on file.     Family History  Problem Relation Age of Onset  . Kidney disease Mother   .  Hypertension Mother   . Other Father        gangrene with amputation  . Diabetes Sister   . Cancer Brother   . Heart disease Brother   . Schizophrenia Daughter   . Diabetes Daughter   . Stroke Daughter   . Diabetes Sister   . Breast cancer Neg Hx     Social History   Tobacco Use  . Smoking status: Never Smoker  . Smokeless tobacco: Former Systems developer    Types: Chew  Substance Use Topics  . Alcohol use: No  . Drug use: No    Home Medications Prior to Admission medications   Medication Sig Start Date End Date Taking?  Authorizing Provider  acetaminophen (TYLENOL) 325 MG tablet Take 650 mg by mouth every 6 (six) hours as needed for headache (pain).    [provider]  albuterol (PROVENTIL HFA;VENTOLIN HFA) 108 (90 Base) MCG/ACT inhaler Inhale 1-2 puffs into the lungs every 4 (four) hours as needed for wheezing or shortness of breath.     [provider]  alendronate (FOSAMAX) 70 MG tablet Take 70 mg by mouth every Saturday.     [provider]  busPIRone (BUSPAR) 5 MG tablet Take 5 mg by mouth 3 (three) times daily. 11/24/17   [provider]  Calcium Citrate-Vitamin D (CALCIUM CITRATE + D) 315-250 MG-UNIT TABS Take 1 tablet by mouth daily.    [provider]  cephALEXin (KEFLEX) 500 MG capsule Take 1 capsule (500 mg total) by mouth 2 (two) times daily for 14 days. 04/02/19 04/16/19  Corena Herter, PA-C  docusate sodium (COLACE) 100 MG capsule Take 1 capsule (100 mg total) by mouth 2 (two) times daily. Patient taking differently: Take 100 mg by mouth 2 (two) times daily as needed for mild constipation.  05/03/18   Lavina Hamman, MD  donepezil (ARICEPT) 10 MG tablet Take 10 mg by mouth at bedtime.  02/24/15   [provider]  escitalopram (LEXAPRO) 10 MG tablet Take 10 mg by mouth at bedtime. 06/16/17   [provider]  FLOVENT HFA 220 MCG/ACT inhaler Inhale 1 puff into the lungs 2 (two) times daily. 05/31/18   Mannam, Hart Robinsons, MD  gabapentin (NEURONTIN) 300 MG capsule Take 300 mg by mouth 3 (three) times daily. 10/12/15   [provider]  losartan (COZAAR) 50 MG tablet Take 50 mg by mouth daily. 03/23/19   [provider]  memantine (NAMENDA XR) 28 MG CP24 24 hr capsule Take 28 mg by mouth daily.    [provider]  metFORMIN (GLUCOPHAGE-XR) 500 MG 24 hr tablet Take 500 mg by mouth at bedtime. 03/24/19   [provider]  montelukast (SINGULAIR) 10 MG tablet Take 10 mg by mouth at bedtime.  06/21/17   [provider]    predniSONE (DELTASONE) 10 MG tablet Take 4 tabs po daily x 2 days; then 3 tabs for 2 days; then 2 tabs for 2 days; then 1 tab for 2 days Patient not taking: Reported on 04/02/2019 08/01/18   Martyn Ehrich, NP    Allergies    Aspirin  Review of Systems   Review of Systems  Unable to perform ROS: Dementia    Physical Exam Updated Vital Signs BP (!) 129/58   Pulse 60   Temp 98.2 F (36.8 C) (Oral)   Resp 14   SpO2 99%   Physical Exam Vitals and nursing note reviewed.  Constitutional:      General: She  is not in acute distress.    Appearance: She is well-developed.     Comments: No distress, oriented to person, place and January.  HENT:     Head: Normocephalic and atraumatic.     Mouth/Throat:     Pharynx: No oropharyngeal exudate.  Eyes:     Conjunctiva/sclera: Conjunctivae normal.     Pupils: Pupils are equal, round, and reactive to light.  Neck:     Comments: No C-spine tender Cardiovascular:     Rate and Rhythm: Normal rate and regular rhythm.     Heart sounds: Normal heart sounds. No murmur.  Pulmonary:     Effort: Pulmonary effort is normal. No respiratory distress.     Breath sounds: Normal breath sounds.     Comments: Pacemaker in place Chest:     Chest wall: No tenderness.  Abdominal:     Palpations: Abdomen is soft.     Tenderness: There is no abdominal tenderness. There is no guarding or rebound.  Musculoskeletal:        General: No tenderness. Normal range of motion.     Cervical back: Normal range of motion and neck supple.     Comments: No T or L-spine tenderness  Skin:    General: Skin is warm.  Neurological:     Mental Status: She is alert and oriented to person, place, and time.     Cranial Nerves: No cranial nerve deficit.     Motor: No abnormal muscle tone.     Coordination: Coordination normal.     Comments:  5/5 strength throughout. CN 2-12 intact.Equal grip strength.   Psychiatric:        Behavior: Behavior normal.     ED Results  / Procedures / Treatments   Labs (all labs ordered are listed, but only abnormal results are displayed) Labs Reviewed  BASIC METABOLIC PANEL - Abnormal; Notable for the following components:      Result Value   CO2 20 (*)    Glucose, Bld 129 (*)    Creatinine, Ser 1.78 (*)    GFR calc non Af Amer 26 (*)    GFR calc Af Amer 30 (*)    All other components within normal limits  URINE CULTURE  CBC  URINALYSIS, ROUTINE W REFLEX MICROSCOPIC  TROPONIN I (HIGH SENSITIVITY)  TROPONIN I (HIGH SENSITIVITY)    EKG EKG Interpretation  Date/Time:  Tuesday April 04 2019 22:24:46 EST Ventricular Rate:  60 PR Interval:    QRS Duration: 100 QT Interval:  438 QTC Calculation: 438 R Axis:   -54 Text Interpretation: Atrial-paced rhythm LAD, consider left anterior fascicular block No significant change was found Confirmed by Ezequiel Essex [phone]) on 04/04/2019 10:54:26 PM   Radiology No results found.  Procedures Procedures (including critical care time)  Medications Ordered in ED Medications  sodium chloride 0.9 % bolus 1,000 mL (has no administration in time range)    ED Course  I have reviewed the triage vital signs and the nursing notes.  Pertinent labs & imaging results that were available during my care of the patient were reviewed by me and considered in my medical decision making (see chart for details).    MDM Rules/Calculators/A&P                      Patient from home after a fall on unclear circumstances.  She does not recall what happened.  Denies any dizziness or lightheadedness.  No chest pain or shortness of  breath.  Denies any injury.  Initial blood pressure was slightly low at 90/57.  Patient asymptomatic from this.  She will be gently hydrated and this will be rechecked.  Will check EKG as well as interrogate her pacemaker.  Attempted to contact her granddaughter [name] without response. Her labs show mild AKI.  Interrogation of her pacemaker shows no  atrial or ventricular arrhythmias recently.  Blood pressure has improved. First reading likely spurious.   Will given IVF. Check UA, culture, troponin.   Calls to granddaughter not returned. Home health apparently already arranged.  Anticipate discharge after results.  Dr Betsey Holiday to assume care.  Final Clinical Impression(s) / ED Diagnoses Final diagnoses:  None    Rx / DC Orders ED Discharge Orders    None       Idalee Foxworthy, Annie Main, MD 04/04/19 2330

## 2019-04-04 NOTE — ED Notes (Signed)
This RN attempted to start IV twice, unsuccessful. Will put in IV team consult for troponin draw and IV medication administration

## 2019-04-05 LAB — URINALYSIS, ROUTINE W REFLEX MICROSCOPIC
Bilirubin Urine: NEGATIVE
Glucose, UA: NEGATIVE mg/dL
Hgb urine dipstick: NEGATIVE
Ketones, ur: NEGATIVE mg/dL
Leukocytes,Ua: NEGATIVE
Nitrite: NEGATIVE
Protein, ur: NEGATIVE mg/dL
Specific Gravity, Urine: 1.013 (ref 1.005–1.030)
pH: 5 (ref 5.0–8.0)

## 2019-04-05 NOTE — ED Notes (Signed)
Colostomy bag burped to relieve pressure

## 2019-04-05 NOTE — ED Provider Notes (Signed)
Patient signed out to me to follow-up on lab work.  She was seen in the ER after a fall.  She has had previous falls as well.  Patient was in the ER several days ago and had social work and case management evaluation and is pending home health.  She is apparently being treated for urinary tract infection but culture was not obtained.  Her urinalysis today, however looks significantly improved.  Remainder of blood work was unremarkable.  She does not have any new acute emergent condition that would require hospitalization, can return to home and continue work-up for home health.   Orpah Greek, MD 04/05/19 [phone]

## 2019-04-05 NOTE — ED Notes (Addendum)
Pt provided a warm blanket. Lights dimmed for comfort. Bolus still infusing

## 2019-04-06 LAB — URINE CULTURE: Culture: NO GROWTH

## 2019-04-13 ENCOUNTER — Ambulatory Visit (INDEPENDENT_AMBULATORY_CARE_PROVIDER_SITE_OTHER): Payer: Medicare Other | Admitting: Otolaryngology

## 2019-04-18 ENCOUNTER — Emergency Department (HOSPITAL_COMMUNITY)
Admission: EM | Admit: 2019-04-18 | Discharge: 2019-04-19 | Disposition: A | Discharge status: Home / Self Care | Payer: Medicare Other | Attending: Emergency Medicine | Admitting: Emergency Medicine

## 2019-04-18 DIAGNOSIS — Z950 Presence of cardiac pacemaker: Secondary | ICD-10-CM | POA: Insufficient documentation

## 2019-04-18 DIAGNOSIS — F0390 Unspecified dementia without behavioral disturbance: Secondary | ICD-10-CM | POA: Insufficient documentation

## 2019-04-18 DIAGNOSIS — Z87891 Personal history of nicotine dependence: Secondary | ICD-10-CM | POA: Diagnosis not present

## 2019-04-18 DIAGNOSIS — Z933 Colostomy status: Secondary | ICD-10-CM | POA: Insufficient documentation

## 2019-04-18 DIAGNOSIS — R630 Anorexia: Secondary | ICD-10-CM | POA: Diagnosis not present

## 2019-04-18 DIAGNOSIS — R197 Diarrhea, unspecified: Secondary | ICD-10-CM | POA: Diagnosis not present

## 2019-04-18 DIAGNOSIS — N183 Chronic kidney disease, stage 3 unspecified: Secondary | ICD-10-CM | POA: Insufficient documentation

## 2019-04-18 DIAGNOSIS — N179 Acute kidney failure, unspecified: Secondary | ICD-10-CM | POA: Diagnosis not present

## 2019-04-18 DIAGNOSIS — I129 Hypertensive chronic kidney disease with stage 1 through stage 4 chronic kidney disease, or unspecified chronic kidney disease: Secondary | ICD-10-CM | POA: Diagnosis not present

## 2019-04-18 DIAGNOSIS — E860 Dehydration: Secondary | ICD-10-CM | POA: Diagnosis not present

## 2019-04-18 DIAGNOSIS — E1122 Type 2 diabetes mellitus with diabetic chronic kidney disease: Secondary | ICD-10-CM | POA: Diagnosis not present

## 2019-04-18 DIAGNOSIS — Z20822 Contact with and (suspected) exposure to covid-19: Secondary | ICD-10-CM | POA: Diagnosis not present

## 2019-04-18 DIAGNOSIS — R531 Weakness: Secondary | ICD-10-CM | POA: Diagnosis present

## 2019-04-18 DIAGNOSIS — J449 Chronic obstructive pulmonary disease, unspecified: Secondary | ICD-10-CM | POA: Diagnosis not present

## 2019-04-18 DIAGNOSIS — Z79899 Other long term (current) drug therapy: Secondary | ICD-10-CM | POA: Diagnosis not present

## 2019-04-18 DIAGNOSIS — Z7984 Long term (current) use of oral hypoglycemic drugs: Secondary | ICD-10-CM | POA: Insufficient documentation

## 2019-04-18 LAB — CBC
HCT: 36.9 % (ref 36.0–46.0)
Hemoglobin: 11.3 g/dL — ABNORMAL LOW (ref 12.0–15.0)
MCH: 30.7 pg (ref 26.0–34.0)
MCHC: 30.6 g/dL (ref 30.0–36.0)
MCV: 100.3 fL — ABNORMAL HIGH (ref 80.0–100.0)
Platelets: 203 10*3/uL (ref 150–400)
RBC: 3.68 MIL/uL — ABNORMAL LOW (ref 3.87–5.11)
RDW: 13.4 % (ref 11.5–15.5)
WBC: 8 10*3/uL (ref 4.0–10.5)
nRBC: 0 % (ref 0.0–0.2)

## 2019-04-18 LAB — BASIC METABOLIC PANEL
Anion gap: 15 (ref 5–15)
BUN: 27 mg/dL — ABNORMAL HIGH (ref 8–23)
CO2: 21 mmol/L — ABNORMAL LOW (ref 22–32)
Calcium: 9.1 mg/dL (ref 8.9–10.3)
Chloride: 107 mmol/L (ref 98–111)
Creatinine, Ser: 2.18 mg/dL — ABNORMAL HIGH (ref 0.44–1.00)
GFR calc Af Amer: 24 mL/min — ABNORMAL LOW (ref >60)
GFR calc non Af Amer: 20 mL/min — ABNORMAL LOW (ref >60)
Glucose, Bld: 166 mg/dL — ABNORMAL HIGH (ref 70–99)
Potassium: 4.6 mmol/L (ref 3.5–5.1)
Sodium: 143 mmol/L (ref 135–145)

## 2019-04-18 MED ORDER — SODIUM CHLORIDE 0.9 % IV BOLUS
1000.0000 mL | Freq: Once | INTRAVENOUS | Status: AC
  Administered 2019-04-18: 22:00:00 1000 mL via INTRAVENOUS

## 2019-04-18 MED ORDER — LOPERAMIDE HCL 2 MG PO CAPS
4.0000 mg | ORAL_CAPSULE | Freq: Once | ORAL | Status: AC
  Administered 2019-04-18: 4 mg via ORAL
  Filled 2019-04-18: qty 2

## 2019-04-18 MED ORDER — SODIUM CHLORIDE 0.9 % IV BOLUS
1000.0000 mL | Freq: Once | INTRAVENOUS | Status: AC
  Administered 2019-04-18: 1000 mL via INTRAVENOUS

## 2019-04-18 NOTE — ED Triage Notes (Signed)
Pt from home by EMS with granddaughter for weakness; recent diagnosis for UTI, finished round of antibiotics. Family reported increased output from colostomy in the past couple days. Hx of dementia

## 2019-04-18 NOTE — ED Provider Notes (Signed)
[facility] EMERGENCY DEPARTMENT Provider Note   CSN: [id number] Arrival date & time: 04/18/19  2006     History Chief Complaint  Patient presents with  . Weakness    [name] is a 86 y.o. female.  HPI   86 year old female with generalized weakness.  Coming from home.  Patient has dementia.  She is pleasant, but not a very good historian.  She does tells me that she feels "bad."  Does endorse poor appetite.  No vomiting or diarrhea.  No fevers or chills.  Denies any acute pain.  Apparently recently diagnosed with UTI and treated.  Now off antibiotics.  She has had increased watery output from her colostomy over the past several days.  Past Medical History:  Diagnosis Date  . Asthma   . CKD (chronic kidney disease) stage 3, GFR 30-59 ml/min 05/01/2018  . Colostomy present ([place])    hx/notes 07/17/2010  . Dementia ([place])   . Diabetic neuropathy ([place])   . Hypertension    hx/notes 07/17/2010  . Pneumonia    recent/notes 10/08/2016  . Symptomatic bradycardia 12/15/2017  . Type II diabetes mellitus ([place])    hx/notes 07/17/2010    Patient Active Problem List   Diagnosis Date Noted  . Symptomatic bradycardia 05/01/2018  . Near syncope 05/01/2018  . CKD (chronic kidney disease) stage 3, GFR 30-59 ml/min 05/01/2018  . Influenza B 04/10/2018  . Generalized weakness 04/10/2018  . Essential hypertension 03/01/2018  . COPD (chronic obstructive pulmonary disease) ([place]) 03/01/2018  . Bradycardia 12/15/2017  . Colostomy present ([place]) 11/08/2016  . Syncope 10/08/2016  . Acute kidney injury ([place]) 10/08/2016  . Abnormal chest x-ray 10/08/2016  . Lethargy 10/08/2016  . Dementia ([place])   . Sepsis ([place]) 09/05/2016  . Community acquired pneumonia 09/05/2016  . Toxic metabolic encephalopathy [id number]  . Diabetes mellitus with complication ([place]) Q000111Q  . Nausea & vomiting 04/09/2012  . Gallstones 03/15/2011  . Partial SBO 03/15/2011  . Colostomy hernia ([place])  03/15/2011  . PSEUDOGOUT 02/28/2007  . IRON DEFIC ANEMIA [place] DIET IRON INTAKE 02/28/2007  . DISCITIS 02/28/2007  . OSTEOMYELITIS 02/28/2007  . CHILLS WITHOUT FEVER 02/28/2007    Past Surgical History:  Procedure Laterality Date  . ABDOMINAL HYSTERECTOMY    . COLOSTOMY     S/P SBO hx/notes 07/17/2010  . LUMBAR FUSION  11/2006   hx/notes 07/17/2010  . NASAL SINUS SURGERY  04/28/2002   Bilateral inferior turbinate reductions, bilateral maxillary antrotomies, bilateral total ethmoidectomies, bilateral frontal recess explorations, bilateral sphenoidotomies, Instatrac guidance./notes 07/28/2010  . PACEMAKER IMPLANT N/A 05/02/2018   Procedure: PACEMAKER IMPLANT;  Surgeon: Evans Lance, MD;  Location: [facility];  Service: Cardiovascular;  Laterality: N/A;     OB History   No obstetric history on file.     Family History  Problem Relation Age of Onset  . Kidney disease Mother   . Hypertension Mother   . Other Father        gangrene with amputation  . Diabetes Sister   . Cancer Brother   . Heart disease Brother   . Schizophrenia Daughter   . Diabetes Daughter   . Stroke Daughter   . Diabetes Sister   . Breast cancer Neg Hx     Social History   Tobacco Use  . Smoking status: Never Smoker  . Smokeless tobacco: Former Systems developer    Types: Chew  Substance Use Topics  . Alcohol use: No  . Drug use: No  Home Medications Prior to Admission medications   Medication Sig Start Date End Date Taking? Authorizing Provider  acetaminophen (TYLENOL) 325 MG tablet Take 650 mg by mouth every 6 (six) hours as needed for headache (pain).    [provider]  albuterol (PROVENTIL HFA;VENTOLIN HFA) 108 (90 Base) MCG/ACT inhaler Inhale 1-2 puffs into the lungs every 4 (four) hours as needed for wheezing or shortness of breath.     [provider]  alendronate (FOSAMAX) 70 MG tablet Take 70 mg by mouth every Saturday.     [provider]  busPIRone (BUSPAR) 5 MG  tablet Take 5 mg by mouth 3 (three) times daily. 11/24/17   [provider]  Calcium Citrate-Vitamin D (CALCIUM CITRATE + D) 315-250 MG-UNIT TABS Take 1 tablet by mouth daily.    [provider]  docusate sodium (COLACE) 100 MG capsule Take 1 capsule (100 mg total) by mouth 2 (two) times daily. Patient taking differently: Take 100 mg by mouth 2 (two) times daily as needed for mild constipation.  05/03/18   Lavina Hamman, MD  donepezil (ARICEPT) 10 MG tablet Take 10 mg by mouth at bedtime.  02/24/15   [provider]  escitalopram (LEXAPRO) 10 MG tablet Take 10 mg by mouth at bedtime. 06/16/17   [provider]  FLOVENT HFA 220 MCG/ACT inhaler Inhale 1 puff into the lungs 2 (two) times daily. 05/31/18   Mannam, Hart Robinsons, MD  gabapentin (NEURONTIN) 300 MG capsule Take 300 mg by mouth 3 (three) times daily. 10/12/15   [provider]  losartan (COZAAR) 50 MG tablet Take 50 mg by mouth daily. 03/23/19   [provider]  memantine (NAMENDA XR) 28 MG CP24 24 hr capsule Take 28 mg by mouth daily.    [provider]  metFORMIN (GLUCOPHAGE-XR) 500 MG 24 hr tablet Take 500 mg by mouth at bedtime. 03/24/19   [provider]  montelukast (SINGULAIR) 10 MG tablet Take 10 mg by mouth at bedtime.  06/21/17   [provider]  predniSONE (DELTASONE) 10 MG tablet Take 4 tabs po daily x 2 days; then 3 tabs for 2 days; then 2 tabs for 2 days; then 1 tab for 2 days Patient not taking: Reported on 04/02/2019 08/01/18   Martyn Ehrich, NP    Allergies    Aspirin  Review of Systems   Review of Systems All systems reviewed and negative, other than as noted in HPI.  Physical Exam Updated Vital Signs BP 109/60   Pulse 60   Temp 98.4 F (36.9 C) (Oral)   Resp 16   SpO2 96%   Physical Exam Vitals and nursing note reviewed.  Constitutional:      General: She is not in acute distress.    Appearance: She is well-developed.  HENT:     Head:  Normocephalic and atraumatic.  Eyes:     General:        Right eye: No discharge.        Left eye: No discharge.     Conjunctiva/sclera: Conjunctivae normal.  Cardiovascular:     Rate and Rhythm: Normal rate and regular rhythm.     Heart sounds: Normal heart sounds. No murmur. No friction rub. No gallop.   Pulmonary:     Effort: Pulmonary effort is normal. No respiratory distress.     Breath sounds: Normal breath sounds.  Abdominal:     General: There is no distension.     Palpations: Abdomen is  soft.     Tenderness: There is no abdominal tenderness.     Comments: Watery brown stool in ostomy bag.  Abdomen soft and nontender.  Musculoskeletal:        General: No tenderness.     Cervical back: Neck supple.  Skin:    General: Skin is warm and dry.  Neurological:     Mental Status: She is alert.  Psychiatric:        Behavior: Behavior normal.        Thought Content: Thought content normal.     ED Results / Procedures / Treatments   Labs (all labs ordered are listed, but only abnormal results are displayed) Labs Reviewed  BASIC METABOLIC PANEL - Abnormal; Notable for the following components:      Result Value   CO2 21 (*)    Glucose, Bld 166 (*)    BUN 27 (*)    Creatinine, Ser 2.18 (*)    GFR calc non Af Amer 20 (*)    GFR calc Af Amer 24 (*)    All other components within normal limits  CBC - Abnormal; Notable for the following components:   RBC 3.68 (*)    Hemoglobin 11.3 (*)    MCV 100.3 (*)    All other components within normal limits  URINE CULTURE  CULTURE, BLOOD (ROUTINE X 2)  CULTURE, BLOOD (ROUTINE X 2)  RESPIRATORY PANEL BY RT PCR (FLU A&B, COVID)  URINALYSIS, ROUTINE W REFLEX MICROSCOPIC  CBG MONITORING, ED    EKG None  Radiology No results found.  Procedures Procedures (including critical care time)  Medications Ordered in ED Medications  loperamide (IMODIUM) capsule 4 mg (has no administration in time range)  sodium chloride 0.9 % bolus  1,000 mL (has no administration in time range)  sodium chloride 0.9 % bolus 1,000 mL (0 mLs Intravenous Stopped 04/18/19 2309)    ED Course  I have reviewed the triage vital signs and the nursing notes.  Pertinent labs & imaging results that were available during my care of the patient were reviewed by me and considered in my medical decision making (see chart for details).    MDM Rules/Calculators/A&P                      86 year old female with generalized weakness.  Arrived hypotensive.  Is responding to IV fluids.  She has had increased ostomy output over the last several days.  Recently treated for urinary tract infection and I suspect that this led to diarrhea and dehydration.  She was treated with IV fluids and Imodium.  She is afebrile.  Abdominal exam is benign.  She denies any acute pain.  If blood pressure normalizes and she is feeling better then she may be able to be discharged back home with PRN imodium, push fluids and can try probiotics. Her renal function has been progressively worsening though and if she isn't feeling much better after a second liter of IVF then I would admit her for ongoing management. Care signed out to Dr Betsey Holiday.  Final Clinical Impression(s) / ED Diagnoses Final diagnoses:  Dehydration  Generalized weakness  AKI (acute kidney injury) [facility])    Rx / DC Orders ED Discharge Orders    None       Virgel Manifold, MD 04/19/19 [phone]

## 2019-04-18 NOTE — Discharge Instructions (Addendum)
The antibiotics to have been recently taken for urinary tract infection is likely causing diarrhea.  This is led to dehydration your blood pressure was low. Your blood pressure has improved with IV fluids.  You can take 2 mg of imodium every 4 hours as needed for the next couple days. You can also take probiotics.

## 2019-04-19 LAB — BASIC METABOLIC PANEL
Anion gap: 8 (ref 5–15)
BUN: 26 mg/dL — ABNORMAL HIGH (ref 8–23)
CO2: 23 mmol/L (ref 22–32)
Calcium: 8.2 mg/dL — ABNORMAL LOW (ref 8.9–10.3)
Chloride: 112 mmol/L — ABNORMAL HIGH (ref 98–111)
Creatinine, Ser: 1.84 mg/dL — ABNORMAL HIGH (ref 0.44–1.00)
GFR calc Af Amer: 29 mL/min — ABNORMAL LOW (ref >60)
GFR calc non Af Amer: 25 mL/min — ABNORMAL LOW (ref >60)
Glucose, Bld: 177 mg/dL — ABNORMAL HIGH (ref 70–99)
Potassium: 4.8 mmol/L (ref 3.5–5.1)
Sodium: 143 mmol/L (ref 135–145)

## 2019-04-19 LAB — URINALYSIS, ROUTINE W REFLEX MICROSCOPIC
Bilirubin Urine: NEGATIVE
Glucose, UA: NEGATIVE mg/dL
Hgb urine dipstick: NEGATIVE
Ketones, ur: NEGATIVE mg/dL
Leukocytes,Ua: NEGATIVE
Nitrite: NEGATIVE
Protein, ur: NEGATIVE mg/dL
Specific Gravity, Urine: 1.021 (ref 1.005–1.030)
pH: 5 (ref 5.0–8.0)

## 2019-04-19 LAB — CBG MONITORING, ED: Glucose-Capillary: 164 mg/dL — ABNORMAL HIGH (ref 70–99)

## 2019-04-19 LAB — RESPIRATORY PANEL BY RT PCR (FLU A&B, COVID)
Influenza A by PCR: NEGATIVE
Influenza B by PCR: NEGATIVE
SARS Coronavirus 2 by RT PCR: NEGATIVE

## 2019-04-19 NOTE — ED Notes (Signed)
Pt remains resting on cart in NAD. Alert, speaking in full sentences. Breathing easy, non-labored. VSS on monitors. Call light remains within reach. Hourly rounds completed

## 2019-04-19 NOTE — ED Notes (Signed)
Phlebotomy at bedside to attempt to draw pt's blood

## 2019-04-19 NOTE — ED Notes (Signed)
covid swab collected, labeled with 2 pt identifiers, and brought to lab 

## 2019-04-19 NOTE — ED Notes (Addendum)
Patient and granddaughter [name] verbalize understanding of discharge instructions. Opportunity for questioning and answers were provided. All questions answered. PIV removed, catheter intact. Site dressed with gauze and tape. Armband removed by staff, pt discharged from ED via [facility]

## 2019-04-19 NOTE — ED Provider Notes (Signed)
Patient signed out to me by Dr. Wilson Singer to follow.  Patient was seen with generalized weakness that was felt to be secondary to dehydration.  Patient has been experiencing diarrhea which likely has led to her dehydrated state.  She did have a mild to moderate acute kidney injury at arrival.  She was hydrated with 2 L of saline and repeat BMP shows improvement.  She is not orthostatic and feels improved.  Patient is therefore felt to be appropriate for discharge with follow-up with PCP for repeat lab work.   Orpah Greek, MD 04/19/19 [phone]

## 2019-04-19 NOTE — ED Notes (Signed)
Pt straight cathed under sterile procedure. Urine collected, labeled with 2 pt identifiers, and sent to lab

## 2019-04-19 NOTE — ED Notes (Signed)
Pt resting on ED cart in NAD. Alert, speaking in full sentences. Breathing easy, non-labored. VSS. Call light within reach. Hourly rounds completed. Will continue to monitor

## 2019-04-19 NOTE — ED Notes (Signed)
Called [facility] for transport home--[name] 

## 2019-04-19 NOTE — ED Notes (Signed)
Assumed care of pt. Pt alert, resting on cart in NAD. Breathing easy, non-labored. Pt's colostomy bag noted to be loosened with large amount of stool leakage on ED cart and floor. Pt turned, cleaned, and repositioned. New linens provided. EVS called to mop floor

## 2019-04-19 NOTE — ED Notes (Signed)
Orthostatics completed by tech

## 2019-04-19 NOTE — ED Notes (Signed)
Granddaughter [name] given update over the phone. Informed of pt's discharge. Granddaughter reports pt is usually sent home with [facility]. [facility] arranged by Network engineer

## 2019-04-19 NOTE — ED Notes (Signed)
Labs sent by phlebotomy [phone] [phone]

## 2019-04-20 LAB — URINE CULTURE: Culture: NO GROWTH

## 2019-04-21 ENCOUNTER — Other Ambulatory Visit: Payer: Self-pay | Admitting: *Deleted

## 2019-04-21 NOTE — Patient Outreach (Signed)
[facility]) Care Management  04/21/2019  [name] Jul 10, 1933 [id number]  RN Health Coach attempted follow up outreach call to patient.  Patient was unavailable. No voicemail message pick up. Plan: RN will call patient again within 30 days.  [facility] Care Management [phone]

## 2019-04-23 LAB — CULTURE, BLOOD (ROUTINE X 2): Culture: NO GROWTH

## 2019-04-28 LAB — CULTURE, BLOOD (ROUTINE X 2): Culture: NO GROWTH

## 2019-05-03 ENCOUNTER — Encounter: Payer: Medicare Other | Admitting: Internal Medicine

## 2019-05-10 ENCOUNTER — Emergency Department (HOSPITAL_COMMUNITY): Payer: Medicare Other

## 2019-05-10 ENCOUNTER — Other Ambulatory Visit: Payer: Self-pay

## 2019-05-10 ENCOUNTER — Encounter (HOSPITAL_COMMUNITY): Payer: Self-pay

## 2019-05-10 ENCOUNTER — Inpatient Hospital Stay (HOSPITAL_COMMUNITY)
Admission: EM | Admit: 2019-05-10 | Discharge: 2019-05-13 | DRG: 682 | Disposition: A | Discharge status: Home Health | Payer: Medicare Other | Attending: Internal Medicine | Admitting: Internal Medicine

## 2019-05-10 DIAGNOSIS — N390 Urinary tract infection, site not specified: Secondary | ICD-10-CM | POA: Diagnosis present

## 2019-05-10 DIAGNOSIS — G9341 Metabolic encephalopathy: Secondary | ICD-10-CM | POA: Diagnosis present

## 2019-05-10 DIAGNOSIS — N184 Chronic kidney disease, stage 4 (severe): Secondary | ICD-10-CM | POA: Diagnosis present

## 2019-05-10 DIAGNOSIS — E119 Type 2 diabetes mellitus without complications: Secondary | ICD-10-CM | POA: Diagnosis present

## 2019-05-10 DIAGNOSIS — N183 Chronic kidney disease, stage 3 unspecified: Secondary | ICD-10-CM | POA: Diagnosis present

## 2019-05-10 DIAGNOSIS — B965 Pseudomonas (aeruginosa) (mallei) (pseudomallei) as the cause of diseases classified elsewhere: Secondary | ICD-10-CM | POA: Diagnosis present

## 2019-05-10 DIAGNOSIS — Z20822 Contact with and (suspected) exposure to covid-19: Secondary | ICD-10-CM | POA: Diagnosis present

## 2019-05-10 DIAGNOSIS — Z79899 Other long term (current) drug therapy: Secondary | ICD-10-CM

## 2019-05-10 DIAGNOSIS — Z833 Family history of diabetes mellitus: Secondary | ICD-10-CM

## 2019-05-10 DIAGNOSIS — N1832 Chronic kidney disease, stage 3b: Secondary | ICD-10-CM | POA: Diagnosis present

## 2019-05-10 DIAGNOSIS — Z90710 Acquired absence of both cervix and uterus: Secondary | ICD-10-CM

## 2019-05-10 DIAGNOSIS — Z87891 Personal history of nicotine dependence: Secondary | ICD-10-CM

## 2019-05-10 DIAGNOSIS — N179 Acute kidney failure, unspecified: Secondary | ICD-10-CM | POA: Diagnosis not present

## 2019-05-10 DIAGNOSIS — Z7984 Long term (current) use of oral hypoglycemic drugs: Secondary | ICD-10-CM

## 2019-05-10 DIAGNOSIS — F0390 Unspecified dementia without behavioral disturbance: Secondary | ICD-10-CM | POA: Diagnosis present

## 2019-05-10 DIAGNOSIS — J4489 Other specified chronic obstructive pulmonary disease: Secondary | ICD-10-CM | POA: Diagnosis present

## 2019-05-10 DIAGNOSIS — E1140 Type 2 diabetes mellitus with diabetic neuropathy, unspecified: Secondary | ICD-10-CM | POA: Diagnosis present

## 2019-05-10 DIAGNOSIS — G9340 Encephalopathy, unspecified: Secondary | ICD-10-CM

## 2019-05-10 DIAGNOSIS — E118 Type 2 diabetes mellitus with unspecified complications: Secondary | ICD-10-CM | POA: Diagnosis present

## 2019-05-10 DIAGNOSIS — E875 Hyperkalemia: Secondary | ICD-10-CM | POA: Diagnosis present

## 2019-05-10 DIAGNOSIS — R5381 Other malaise: Secondary | ICD-10-CM | POA: Diagnosis present

## 2019-05-10 DIAGNOSIS — I129 Hypertensive chronic kidney disease with stage 1 through stage 4 chronic kidney disease, or unspecified chronic kidney disease: Secondary | ICD-10-CM | POA: Diagnosis present

## 2019-05-10 DIAGNOSIS — E860 Dehydration: Secondary | ICD-10-CM | POA: Diagnosis present

## 2019-05-10 DIAGNOSIS — I10 Essential (primary) hypertension: Secondary | ICD-10-CM | POA: Diagnosis present

## 2019-05-10 DIAGNOSIS — E1122 Type 2 diabetes mellitus with diabetic chronic kidney disease: Secondary | ICD-10-CM | POA: Diagnosis present

## 2019-05-10 DIAGNOSIS — Z933 Colostomy status: Secondary | ICD-10-CM

## 2019-05-10 DIAGNOSIS — D689 Coagulation defect, unspecified: Secondary | ICD-10-CM | POA: Diagnosis present

## 2019-05-10 DIAGNOSIS — J449 Chronic obstructive pulmonary disease, unspecified: Secondary | ICD-10-CM | POA: Diagnosis present

## 2019-05-10 LAB — RAPID URINE DRUG SCREEN, HOSP PERFORMED
Amphetamines: NOT DETECTED
Barbiturates: NOT DETECTED
Benzodiazepines: NOT DETECTED
Cocaine: NOT DETECTED
Opiates: NOT DETECTED
Tetrahydrocannabinol: NOT DETECTED

## 2019-05-10 LAB — CBC
HCT: 40.6 % (ref 36.0–46.0)
Hemoglobin: 12.3 g/dL (ref 12.0–15.0)
MCH: 30 pg (ref 26.0–34.0)
MCHC: 30.3 g/dL (ref 30.0–36.0)
MCV: 99 fL (ref 80.0–100.0)
Platelets: 207 10*3/uL (ref 150–400)
RBC: 4.1 MIL/uL (ref 3.87–5.11)
RDW: 13 % (ref 11.5–15.5)
WBC: 8.6 10*3/uL (ref 4.0–10.5)
nRBC: 0 % (ref 0.0–0.2)

## 2019-05-10 LAB — DIFFERENTIAL
Abs Immature Granulocytes: 0.03 10*3/uL (ref 0.00–0.07)
Basophils Absolute: 0.1 10*3/uL (ref 0.0–0.1)
Basophils Relative: 1 %
Eosinophils Absolute: 0.3 10*3/uL (ref 0.0–0.5)
Eosinophils Relative: 3 %
Immature Granulocytes: 0 %
Lymphocytes Relative: 20 %
Lymphs Abs: 1.7 10*3/uL (ref 0.7–4.0)
Monocytes Absolute: 0.5 10*3/uL (ref 0.1–1.0)
Monocytes Relative: 6 %
Neutro Abs: 6 10*3/uL (ref 1.7–7.7)
Neutrophils Relative %: 70 %

## 2019-05-10 LAB — URINALYSIS, ROUTINE W REFLEX MICROSCOPIC
Bilirubin Urine: NEGATIVE
Glucose, UA: NEGATIVE mg/dL
Hgb urine dipstick: NEGATIVE
Ketones, ur: NEGATIVE mg/dL
Nitrite: NEGATIVE
Protein, ur: NEGATIVE mg/dL
Specific Gravity, Urine: 1.012 (ref 1.005–1.030)
WBC, UA: >50 WBC/hpf — ABNORMAL HIGH (ref 0–5)
pH: 5 (ref 5.0–8.0)

## 2019-05-10 LAB — COMPREHENSIVE METABOLIC PANEL
ALT: 10 U/L (ref 0–44)
AST: 16 U/L (ref 15–41)
Albumin: 3.4 g/dL — ABNORMAL LOW (ref 3.5–5.0)
Alkaline Phosphatase: 79 U/L (ref 38–126)
Anion gap: 13 (ref 5–15)
BUN: 42 mg/dL — ABNORMAL HIGH (ref 8–23)
CO2: 20 mmol/L — ABNORMAL LOW (ref 22–32)
Calcium: 9.3 mg/dL (ref 8.9–10.3)
Chloride: 106 mmol/L (ref 98–111)
Creatinine, Ser: 2.98 mg/dL — ABNORMAL HIGH (ref 0.44–1.00)
GFR calc Af Amer: 16 mL/min — ABNORMAL LOW (ref >60)
GFR calc non Af Amer: 14 mL/min — ABNORMAL LOW (ref >60)
Glucose, Bld: 148 mg/dL — ABNORMAL HIGH (ref 70–99)
Potassium: 6.4 mmol/L (ref 3.5–5.1)
Sodium: 139 mmol/L (ref 135–145)
Total Bilirubin: 0.5 mg/dL (ref 0.3–1.2)
Total Protein: 6.9 g/dL (ref 6.5–8.1)

## 2019-05-10 LAB — APTT: aPTT: 29 seconds (ref 24–36)

## 2019-05-10 LAB — POC SARS CORONAVIRUS 2 AG -  ED: SARS Coronavirus 2 Ag: NEGATIVE

## 2019-05-10 LAB — ETHANOL: Alcohol, Ethyl (B): <10 mg/dL (ref <10)

## 2019-05-10 LAB — PROTIME-INR
INR: 1 (ref 0.8–1.2)
Prothrombin Time: 12.8 seconds (ref 11.4–15.2)

## 2019-05-10 LAB — GLUCOSE, CAPILLARY: Glucose-Capillary: 103 mg/dL — ABNORMAL HIGH (ref 70–99)

## 2019-05-10 MED ORDER — MEMANTINE HCL ER 28 MG PO CP24
28.0000 mg | ORAL_CAPSULE | Freq: Every day | ORAL | Status: DC
  Administered 2019-05-11 – 2019-05-13 (×3): 28 mg via ORAL
  Filled 2019-05-10 (×3): qty 1

## 2019-05-10 MED ORDER — ESCITALOPRAM OXALATE 10 MG PO TABS
10.0000 mg | ORAL_TABLET | Freq: Every day | ORAL | Status: DC
  Administered 2019-05-10 – 2019-05-13 (×4): 10 mg via ORAL
  Filled 2019-05-10 (×4): qty 1

## 2019-05-10 MED ORDER — SODIUM ZIRCONIUM CYCLOSILICATE 10 G PO PACK
10.0000 g | PACK | Freq: Once | ORAL | Status: AC
  Administered 2019-05-10: 21:00:00 10 g via ORAL
  Filled 2019-05-10: qty 1

## 2019-05-10 MED ORDER — ONDANSETRON HCL 4 MG PO TABS: 4.0000 mg | ORAL_TABLET | Freq: Four times a day (QID) | ORAL | Status: DC | PRN

## 2019-05-10 MED ORDER — INSULIN ASPART 100 UNIT/ML ~~LOC~~ SOLN
0.0000 [IU] | Freq: Three times a day (TID) | SUBCUTANEOUS | Status: DC
  Administered 2019-05-11 – 2019-05-13 (×2): 1 [IU] via SUBCUTANEOUS

## 2019-05-10 MED ORDER — HEPARIN SODIUM (PORCINE) 5000 UNIT/ML IJ SOLN
5000.0000 [IU] | Freq: Three times a day (TID) | INTRAMUSCULAR | Status: DC
  Administered 2019-05-10 – 2019-05-12 (×6): 5000 [IU] via SUBCUTANEOUS
  Filled 2019-05-10 (×4): qty 1

## 2019-05-10 MED ORDER — SODIUM CHLORIDE 0.9 % IV BOLUS (SEPSIS)
500.0000 mL | Freq: Once | INTRAVENOUS | Status: AC
  Administered 2019-05-10: 17:00:00 500 mL via INTRAVENOUS

## 2019-05-10 MED ORDER — SODIUM CHLORIDE 0.9 % IV BOLUS (SEPSIS)
500.0000 mL | Freq: Once | INTRAVENOUS | Status: AC
  Administered 2019-05-10: 19:00:00 500 mL via INTRAVENOUS

## 2019-05-10 MED ORDER — ACETAMINOPHEN 650 MG RE SUPP: 650.0000 mg | Freq: Four times a day (QID) | RECTAL | Status: DC | PRN

## 2019-05-10 MED ORDER — SODIUM CHLORIDE 0.9 % IV SOLN: INTRAVENOUS | Status: AC

## 2019-05-10 MED ORDER — SODIUM CHLORIDE 0.9 % IV SOLN
1.0000 g | INTRAVENOUS | Status: DC
  Administered 2019-05-10 – 2019-05-12 (×3): 1 g via INTRAVENOUS
  Filled 2019-05-10 (×2): qty 1
  Filled 2019-05-10 (×2): qty 10

## 2019-05-10 MED ORDER — ONDANSETRON HCL 4 MG/2ML IJ SOLN: 4.0000 mg | Freq: Four times a day (QID) | INTRAMUSCULAR | Status: DC | PRN

## 2019-05-10 MED ORDER — DONEPEZIL HCL 10 MG PO TABS
10.0000 mg | ORAL_TABLET | Freq: Every day | ORAL | Status: DC
  Administered 2019-05-10 – 2019-05-13 (×4): 10 mg via ORAL
  Filled 2019-05-10 (×4): qty 1

## 2019-05-10 MED ORDER — ACETAMINOPHEN 325 MG PO TABS: 650.0000 mg | ORAL_TABLET | Freq: Four times a day (QID) | ORAL | Status: DC | PRN

## 2019-05-10 NOTE — Progress Notes (Signed)
New Admission Note:  Arrival Method: Stet[place] Mental Orientation: Alert and oriented x 2-3 Telemetry: Box 11 Paced Assessment: Completed Skin: warm and dry.  IV: NSL Pain: Denies Tubes: Colostomy left side Safety Measures: Safety Fall Prevention Plan initiated.  Admission: Completed 5 M  Orientation: Patient has been orientated to the room, unit and the staff. Welcome booklet given.  Family: None  Orders have been reviewed and implemented. Will continue to monitor the patient. Call light has been placed within reach and bed alarm has been activated.   Sima Matas BSN, RN  Phone Number: [phone]

## 2019-05-10 NOTE — Progress Notes (Signed)
Report received. Room ready.  

## 2019-05-10 NOTE — ED Notes (Signed)
Bladder scan showed 160 ml.

## 2019-05-10 NOTE — ED Provider Notes (Signed)
[facility] EMERGENCY DEPARTMENT Provider Note   CSN: [id number] Arrival date & time: 05/10/19  1613     History Weakness   [name] is a 86 y.o. female.  HPI   Patient presents to the emergency room for evaluation of weakness.  According to the EMS report she has been lethargic and weak for the last 2 days.  When EMS arrived she was listless but responded when spoken to.  They felt that her pupils were small and so she was given Narcan.  Patient herself states she has been feeling weak the last couple of days.  She states she just does not feel good.  She denies however having any trouble with fevers.  She denies any headache or chest pain.  She denies abdominal pain.  She denied vomiting or diarrhea.  She denied any dysuria.  Past Medical History:  Diagnosis Date  . Asthma   . CKD (chronic kidney disease) stage 3, GFR 30-59 ml/min 05/01/2018  . Colostomy present ([place])    hx/notes 07/17/2010  . Dementia ([place])   . Diabetic neuropathy ([place])   . Hypertension    hx/notes 07/17/2010  . Pneumonia    recent/notes 10/08/2016  . Symptomatic bradycardia 12/15/2017  . Type II diabetes mellitus ([place])    hx/notes 07/17/2010    Patient Active Problem List   Diagnosis Date Noted  . Symptomatic bradycardia 05/01/2018  . Near syncope 05/01/2018  . CKD (chronic kidney disease) stage 3, GFR 30-59 ml/min 05/01/2018  . Influenza B 04/10/2018  . Generalized weakness 04/10/2018  . Essential hypertension 03/01/2018  . COPD (chronic obstructive pulmonary disease) ([place]) 03/01/2018  . Bradycardia 12/15/2017  . Colostomy present ([place]) 11/08/2016  . Syncope 10/08/2016  . Acute kidney injury ([place]) 10/08/2016  . Abnormal chest x-ray 10/08/2016  . Lethargy 10/08/2016  . Dementia ([place])   . Sepsis ([place]) 09/05/2016  . Community acquired pneumonia 09/05/2016  . Toxic metabolic encephalopathy [id number]  . Diabetes mellitus with complication ([place]) [id number]  . Nausea & vomiting  04/09/2012  . Gallstones 03/15/2011  . Partial SBO 03/15/2011  . Colostomy hernia ([place]) 03/15/2011  . PSEUDOGOUT 02/28/2007  . IRON DEFIC ANEMIA [place] DIET IRON INTAKE 02/28/2007  . DISCITIS 02/28/2007  . OSTEOMYELITIS 02/28/2007  . CHILLS WITHOUT FEVER 02/28/2007    Past Surgical History:  Procedure Laterality Date  . ABDOMINAL HYSTERECTOMY    . COLOSTOMY     S/P SBO hx/notes 07/17/2010  . LUMBAR FUSION  11/2006   hx/notes 07/17/2010  . NASAL SINUS SURGERY  04/28/2002   Bilateral inferior turbinate reductions, bilateral maxillary antrotomies, bilateral total ethmoidectomies, bilateral frontal recess explorations, bilateral sphenoidotomies, Instatrac guidance./notes 07/28/2010  . PACEMAKER IMPLANT N/A 05/02/2018   Procedure: PACEMAKER IMPLANT;  Surgeon: Evans Lance, MD;  Location: [place] [facility];  Service: Cardiovascular;  Laterality: N/A;     OB History   No obstetric history on file.     Family History  Problem Relation Age of Onset  . Kidney disease Mother   . Hypertension Mother   . Other Father        gangrene with amputation  . Diabetes Sister   . Cancer Brother   . Heart disease Brother   . Schizophrenia Daughter   . Diabetes Daughter   . Stroke Daughter   . Diabetes Sister   . Breast cancer Neg Hx     Social History   Tobacco Use  . Smoking status: Never Smoker  . Smokeless tobacco:  Former Systems developer    Types: Chew  Substance Use Topics  . Alcohol use: No  . Drug use: No    Home Medications Prior to Admission medications   Medication Sig Start Date End Date Taking? Authorizing Provider  acetaminophen (TYLENOL) 325 MG tablet Take 650 mg by mouth every 6 (six) hours as needed for headache (pain).    [provider]  albuterol (PROVENTIL HFA;VENTOLIN HFA) 108 (90 Base) MCG/ACT inhaler Inhale 1-2 puffs into the lungs every 4 (four) hours as needed for wheezing or shortness of breath.     [provider]  alendronate (FOSAMAX) 70 MG tablet  Take 70 mg by mouth every Saturday.     [provider]  busPIRone (BUSPAR) 5 MG tablet Take 5 mg by mouth 3 (three) times daily. 11/24/17   [provider]  Calcium Citrate-Vitamin D (CALCIUM CITRATE + D) 315-250 MG-UNIT TABS Take 1 tablet by mouth daily.    [provider]  docusate sodium (COLACE) 100 MG capsule Take 1 capsule (100 mg total) by mouth 2 (two) times daily. Patient taking differently: Take 100 mg by mouth 2 (two) times daily as needed for mild constipation.  05/03/18   Lavina Hamman, MD  donepezil (ARICEPT) 10 MG tablet Take 10 mg by mouth at bedtime.  02/24/15   [provider]  escitalopram (LEXAPRO) 10 MG tablet Take 10 mg by mouth at bedtime. 06/16/17   [provider]  FLOVENT HFA 220 MCG/ACT inhaler Inhale 1 puff into the lungs 2 (two) times daily. 05/31/18   Mannam, Hart Robinsons, MD  gabapentin (NEURONTIN) 300 MG capsule Take 300 mg by mouth 3 (three) times daily. 10/12/15   [provider]  losartan (COZAAR) 50 MG tablet Take 50 mg by mouth daily. 03/23/19   [provider]  memantine (NAMENDA XR) 28 MG CP24 24 hr capsule Take 28 mg by mouth daily.    [provider]  metFORMIN (GLUCOPHAGE-XR) 500 MG 24 hr tablet Take 500 mg by mouth at bedtime. 03/24/19   [provider]  montelukast (SINGULAIR) 10 MG tablet Take 10 mg by mouth at bedtime.  06/21/17   [provider]  predniSONE (DELTASONE) 10 MG tablet Take 4 tabs po daily x 2 days; then 3 tabs for 2 days; then 2 tabs for 2 days; then 1 tab for 2 days Patient not taking: Reported on 04/02/2019 08/01/18   Martyn Ehrich, NP    Allergies    Aspirin  Review of Systems   Review of Systems  All other systems reviewed and are negative.   Physical Exam Updated Vital Signs BP 108/62   Pulse (!) 59   Temp 97.7 F (36.5 C) (Oral)   Resp 13   Ht 1.6 m (5\' 3" )   Wt 72.6 kg   SpO2 96%   BMI 28.35 kg/m   Physical Exam Vitals and nursing  note reviewed.  Constitutional:      General: She is not in acute distress.    Appearance: She is well-developed.     Comments: Lying in the gurney with her eyes closed however when spoken to she opens her eyes and is speaks to me  HENT:     Head: Normocephalic and atraumatic.     Right Ear: External ear normal.     Left Ear: External ear normal.  Eyes:     General: No scleral icterus.       Right eye: No discharge.  Left eye: No discharge.     Conjunctiva/sclera: Conjunctivae normal.  Neck:     Trachea: No tracheal deviation.  Cardiovascular:     Rate and Rhythm: Normal rate and regular rhythm.  Pulmonary:     Effort: Pulmonary effort is normal. No respiratory distress.     Breath sounds: Normal breath sounds. No stridor. No wheezing or rales.  Abdominal:     General: Bowel sounds are normal. There is no distension.     Palpations: Abdomen is soft.     Tenderness: There is no abdominal tenderness. There is no guarding or rebound.     Comments: Colostomy left lower quadrant  Musculoskeletal:        General: No tenderness.     Cervical back: Neck supple.  Skin:    General: Skin is warm and dry.     Findings: No rash.  Neurological:     Cranial Nerves: No cranial nerve deficit (no facial droop, extraocular movements intact, no slurred speech).     Sensory: No sensory deficit.     Motor: No abnormal muscle tone or seizure activity.     Coordination: Coordination normal.     Comments: No pronator drift bilateral upper extremities, patient is able to hold her legs off the bed bilaterally, normal sensation, patient is alert and answers questions appropriately, no facial droop no neglect     ED Results / Procedures / Treatments   Labs (all labs ordered are listed, but only abnormal results are displayed) Labs Reviewed  COMPREHENSIVE METABOLIC PANEL - Abnormal; Notable for the following components:      Result Value   Potassium 6.4 (*)    CO2 20 (*)    Glucose, Bld 148  (*)    BUN 42 (*)    Creatinine, Ser 2.98 (*)    Albumin 3.4 (*)    GFR calc non Af Amer 14 (*)    GFR calc Af Amer 16 (*)    All other components within normal limits  ETHANOL  CBC  DIFFERENTIAL  PROTIME-INR  APTT  RAPID URINE DRUG SCREEN, HOSP PERFORMED  URINALYSIS, ROUTINE W REFLEX MICROSCOPIC  POC SARS CORONAVIRUS 2 AG -  ED    EKG EKG Interpretation  Date/Time:  Wednesday May 10 2019 16:15:57 EST Ventricular Rate:  60 PR Interval:    QRS Duration: 101 QT Interval:  425 QTC Calculation: 425 R Axis:   -42 Text Interpretation: Sinus rhythm Left axis deviation Abnormal R-wave progression, late transition Baseline wander in lead(s) V6 No significant change since last tracing Confirmed by Dorie Rank [phone]) on 05/10/2019 6:11:01 PM   Radiology DG Chest Portable 1 View  Result Date: 05/10/2019 CLINICAL DATA:  Weakness. EXAM: PORTABLE CHEST 1 VIEW COMPARISON:  04/02/2019 FINDINGS: The lungs are clear without focal pneumonia, edema, pneumothorax or pleural effusion. Interstitial markings are diffusely coarsened with chronic features. The cardio pericardial silhouette is enlarged. Left permanent pacemaker again noted. Bones are diffusely demineralized. Telemetry leads overlie the chest. IMPRESSION: Stable.  No acute findings. Electronically Signed   By: Misty Stanley M.D.   On: 05/10/2019 17:24    Procedures Procedures (including critical care time)  Medications Ordered in ED Medications  sodium zirconium cyclosilicate (LOKELMA) packet 10 g (has no administration in time range)  sodium chloride 0.9 % bolus 500 mL (500 mLs Intravenous New Bag/Given 05/10/19 1913)  sodium chloride 0.9 % bolus 500 mL (0 mLs Intravenous Stopped 05/10/19 1909)    ED Course  I have reviewed the  triage vital signs and the nursing notes.  Pertinent labs & imaging results that were available during my care of the patient were reviewed by me and considered in my medical decision making (see chart  for details).  Clinical Course as of May 09 1918  Wed May 10, 2019  [place] [facility] reviewed.  Potassium is elevated at 6.4   [JK]  1809 Creatinine is also increased from previously   [JK]  1809 Chest x-ray without acute findings   [JK]    Clinical Course User Index [JK] Dorie Rank, MD   MDM Rules/Calculators/A&P                      Patient's ED work-up is notable for acute kidney injury.  She has an elevation in BUN and creatinine compared to baseline as well as hyperkalemia.  Patient has remained alert and answering questions appropriately in the ED.  I suspect her weakness is related to her dehydration and acute kidney injury.  She is having some lethargy and that likely related to her chronic medications and decreased renal clearance.  Start her on IV fluids.  She has been given a dose of Lokelma.  I will consult the medical service for admission and further treatment. Final Clinical Impression(s) / ED Diagnoses Final diagnoses:  AKI (acute kidney injury) ([place])  Hyperkalemia  Dehydration      Dorie Rank, MD 05/10/19 1921

## 2019-05-10 NOTE — H&P (Signed)
History and Physical    [name] [id number] DOB: 1933/10/02 DOA: 05/10/2019  PCP: Leeroy Cha, MD  Patient coming from: Home.  History obtained from patient's granddaughter.  Chief Complaint: Lethargy.  HPI: [name] is a 86 y.o. female with history of chronic kidney disease stage IV baseline creatinine is around 1.8 hypertension dementia history of colostomy for perforated viscus, diabetes mellitus type 2 history of symptomatic bradycardia was brought to the ER after patient's granddaughter found the patient was increasingly lethargic last 2 days.  Week and confused.  Has not had any nausea vomiting diarrhea chest pain shortness of breath or any change in the medications.  EMS was called.  EMS gave 1 dose of Narcan since patient was lethargic with pinpoint pupils.  Patient responded.  ED Course: In the ER initially patient was lethargic and labs show worsening renal function with creatinine of 2.9 increased from 1.8 potassium of 6.4 was given Lokelma CD8 unremarkable.  EKG shows normal sinus rhythm.  CBC unremarkable UA shows features concerning for UTI.  Patient was started on IV fluids empiric antibiotics.  Admitted for acute encephalopathy likely from worsening renal function with the patient being on gabapentin which may be contributing.  Review of Systems: As per HPI, rest all negative.   Past Medical History:  Diagnosis Date  . Asthma   . CKD (chronic kidney disease) stage 3, GFR 30-59 ml/min 05/01/2018  . Colostomy present ([place])    hx/notes 07/17/2010  . Dementia ([place])   . Diabetic neuropathy ([place])   . Hypertension    hx/notes 07/17/2010  . Pneumonia    recent/notes 10/08/2016  . Symptomatic bradycardia 12/15/2017  . Type II diabetes mellitus ([place])    hx/notes 07/17/2010    Past Surgical History:  Procedure Laterality Date  . ABDOMINAL HYSTERECTOMY    . COLOSTOMY     S/P SBO hx/notes 07/17/2010  . LUMBAR FUSION  11/2006   hx/notes 07/17/2010  . NASAL  SINUS SURGERY  04/28/2002   Bilateral inferior turbinate reductions, bilateral maxillary antrotomies, bilateral total ethmoidectomies, bilateral frontal recess explorations, bilateral sphenoidotomies, Instatrac guidance./notes 07/28/2010  . PACEMAKER IMPLANT N/A 05/02/2018   Procedure: PACEMAKER IMPLANT;  Surgeon: Evans Lance, MD;  Location: [facility];  Service: Cardiovascular;  Laterality: N/A;     reports that she has never smoked. She quit smokeless tobacco use about 2 years ago.  Her smokeless tobacco use included chew. She reports that she does not drink alcohol or use drugs.  Allergies  Allergen Reactions  . Aspirin Other (See Comments)    Triggers asthma    Family History  Problem Relation Age of Onset  . Kidney disease Mother   . Hypertension Mother   . Other Father        gangrene with amputation  . Diabetes Sister   . Cancer Brother   . Heart disease Brother   . Schizophrenia Daughter   . Diabetes Daughter   . Stroke Daughter   . Diabetes Sister   . Breast cancer Neg Hx     Prior to Admission medications   Medication Sig Start Date End Date Taking? Authorizing Provider  acetaminophen (TYLENOL) 325 MG tablet Take 650 mg by mouth every 6 (six) hours as needed for headache (pain).    [provider]  albuterol (PROVENTIL HFA;VENTOLIN HFA) 108 (90 Base) MCG/ACT inhaler Inhale 1-2 puffs into the lungs every 4 (four) hours as needed for wheezing or shortness of breath.  [provider]  alendronate (FOSAMAX) 70 MG tablet Take 70 mg by mouth every Saturday.     [provider]  busPIRone (BUSPAR) 5 MG tablet Take 5 mg by mouth 3 (three) times daily. 11/24/17   [provider]  Calcium Citrate-Vitamin D (CALCIUM CITRATE + D) 315-250 MG-UNIT TABS Take 1 tablet by mouth daily.    [provider]  docusate sodium (COLACE) 100 MG capsule Take 1 capsule (100 mg total) by mouth 2 (two) times daily. Patient taking differently:  Take 100 mg by mouth 2 (two) times daily as needed for mild constipation.  05/03/18   Lavina Hamman, MD  donepezil (ARICEPT) 10 MG tablet Take 10 mg by mouth at bedtime.  02/24/15   [provider]  escitalopram (LEXAPRO) 10 MG tablet Take 10 mg by mouth at bedtime. 06/16/17   [provider]  FLOVENT HFA 220 MCG/ACT inhaler Inhale 1 puff into the lungs 2 (two) times daily. 05/31/18   Mannam, Hart Robinsons, MD  gabapentin (NEURONTIN) 300 MG capsule Take 300 mg by mouth 3 (three) times daily. 10/12/15   [provider]  losartan (COZAAR) 50 MG tablet Take 50 mg by mouth daily. 03/23/19   [provider]  memantine (NAMENDA XR) 28 MG CP24 24 hr capsule Take 28 mg by mouth daily.    [provider]  metFORMIN (GLUCOPHAGE-XR) 500 MG 24 hr tablet Take 500 mg by mouth at bedtime. 03/24/19   [provider]  montelukast (SINGULAIR) 10 MG tablet Take 10 mg by mouth at bedtime.  06/21/17   [provider]  predniSONE (DELTASONE) 10 MG tablet Take 4 tabs po daily x 2 days; then 3 tabs for 2 days; then 2 tabs for 2 days; then 1 tab for 2 days Patient not taking: Reported on 04/02/2019 08/01/18   Martyn Ehrich, NP    Physical Exam: Constitutional: Moderately built and nourished. Vitals:   05/10/19 1915 05/10/19 1930 05/10/19 2030 05/10/19 2045  BP: (!) 150/79 (!) 159/142 (!) 153/71 (!) 142/72  Pulse: 60 60 60 (!) 59  Resp: 14 13 12 11   Temp:      TempSrc:      SpO2: 98% 99% 97% 97%  Weight:      Height:       Eyes: Anicteric no pallor. ENMT: No discharge from the ears eyes nose or mouth. Neck: No mass felt.  No neck rigidity. Respiratory: No rhonchi or crepitations. Cardiovascular: S1-S2 heard. Abdomen: Soft nontender bowel sounds present. Musculoskeletal: No edema. Skin: No rash. Neurologic: Alert awake oriented to name and place.  Moves all extremities. Psychiatric: Appears normal.   Labs on Admission: I have personally reviewed following  labs and imaging studies  CBC: Recent Labs  Lab 05/10/19 1701  WBC 8.6  NEUTROABS 6.0  HGB 12.3  HCT 40.6  MCV 99.0  PLT [id number]   Basic Metabolic Panel: Recent Labs  Lab 05/10/19 1701  NA 139  K 6.4*  CL 106  CO2 20*  GLUCOSE 148*  BUN 42*  CREATININE 2.98*  CALCIUM 9.3   GFR: Estimated Creatinine Clearance: 13.7 mL/min (A) (by C-G formula based on SCr of 2.98 mg/dL (H)). Liver Function Tests: Recent Labs  Lab 05/10/19 1701  AST 16  ALT 10  ALKPHOS 79  BILITOT 0.5  PROT 6.9  ALBUMIN 3.4*   No results for input(s): LIPASE, AMYLASE in the last 168 hours. No results for input(s): AMMONIA in the last 168 hours. Coagulation Profile:  Recent Labs  Lab 05/10/19 1747  INR 1.0   Cardiac Enzymes: No results for input(s): CKTOTAL, CKMB, CKMBINDEX, TROPONINI in the last 168 hours. BNP (last 3 results) No results for input(s): PROBNP in the last 8760 hours. HbA1C: No results for input(s): HGBA1C in the last 72 hours. CBG: No results for input(s): GLUCAP in the last 168 hours. Lipid Profile: No results for input(s): CHOL, HDL, LDLCALC, TRIG, CHOLHDL, LDLDIRECT in the last 72 hours. Thyroid Function Tests: No results for input(s): TSH, T4TOTAL, FREET4, T3FREE, THYROIDAB in the last 72 hours. Anemia Panel: No results for input(s): VITAMINB12, FOLATE, FERRITIN, TIBC, IRON, RETICCTPCT in the last 72 hours. Urine analysis:    Component Value Date/Time   COLORURINE YELLOW 05/10/2019 1920   APPEARANCEUR CLOUDY (A) 05/10/2019 1920   LABSPEC 1.012 05/10/2019 1920   PHURINE 5.0 05/10/2019 1920   GLUCOSEU NEGATIVE 05/10/2019 1920   HGBUR NEGATIVE 05/10/2019 1920   BILIRUBINUR NEGATIVE 05/10/2019 1920   KETONESUR NEGATIVE 05/10/2019 1920   PROTEINUR NEGATIVE 05/10/2019 1920   UROBILINOGEN 1.0 01/09/2015 1620   NITRITE NEGATIVE 05/10/2019 1920   LEUKOCYTESUR LARGE (A) 05/10/2019 1920   Sepsis Labs: @LABRCNTIP (procalcitonin:4,lacticidven:4) )No results found for this or  any previous visit (from the past 240 hour(s)).   Radiological Exams on Admission: CT HEAD WO CONTRAST  Result Date: 05/10/2019 CLINICAL DATA:  Mental status change with lethargy and weakness for 2 days. Some reported response to Narcan. EXAM: CT HEAD WITHOUT CONTRAST TECHNIQUE: Contiguous axial images were obtained from the base of the skull through the vertex without intravenous contrast. COMPARISON:  CT head 01/28/2019 FINDINGS: Brain: There is no evidence of acute intracranial hemorrhage, mass lesion, brain edema or extra-axial fluid collection. Stable atrophy with prominence of the ventricles and subarachnoid spaces. 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 There is no CT evidence of acute cortical infarction. Vascular: Intracranial vascular calcifications. No hyperdense vessel identified. Skull: Negative for fracture or focal lesion. Sinuses/Orbits: Chronic sinusitis again noted with diffuse mucosal thickening throughout the frontal, ethmoid, sphenoid and maxillary sinuses, similar to previous study. There is diffuse nodularity of the nasal turbinates. Associated chronic osseous thinning without acute bone destruction. No significant orbital findings. Other: None. IMPRESSION: 1. No acute intracranial findings. 2. 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 3. Chronic sinusitis. Electronically Signed   By: Richardean Sale M.D.   On: 05/10/2019 20:12   DG Chest Portable 1 View  Result Date: 05/10/2019 CLINICAL DATA:  Weakness. EXAM: PORTABLE CHEST 1 VIEW COMPARISON:  04/02/2019 FINDINGS: The lungs are clear without focal pneumonia, edema, pneumothorax or pleural effusion. Interstitial markings are diffusely coarsened with chronic features. The cardio pericardial silhouette is enlarged. Left permanent pacemaker again noted. Bones are diffusely demineralized. Telemetry leads overlie the chest. IMPRESSION: Stable.  No acute findings. Electronically Signed   By: Misty Stanley M.D.   On:  05/10/2019 17:24    EKG: Independently reviewed.  Normal sinus rhythm.  Assessment/Plan Principal Problem:   ARF (acute renal failure) (HCC) Active Problems:   Diabetes mellitus with complication (HCC)   Acute encephalopathy   Dementia (HCC)   Essential hypertension   COPD (chronic obstructive pulmonary disease) (HCC)   CKD (chronic kidney disease) stage 3, GFR 30-59 ml/min   Acute lower UTI    1. Acute encephalopathy could be metabolic and possibly infectious.  Patient is afebrile.  UA is concerning for UTI.  Patient has worsening renal function with patient being on gabapentin will hold gabapentin.  Gently hydrate.  Check ammonia levels.  At the time of my exam patient has become more alert awake. 2. Acute on chronic kidney disease stage IV with hyperkalemia -hold ARB.  Probably patient should not be on it anymore.  Lokelma was given for hyperkalemia follow metabolic panel.  Intake output. 3. Hypertension holding ARB and will keep patient IV hydralazine.  Given that patient has worsening renal function will need to probably discontinue ARB. 4. Diabetes mellitus type 2 we will keep patient on sliding scale coverage. 5. History of neuropathy on gabapentin which will be on hold due to worsening renal function and mental status changes. 6. Dementia on Namenda and Aricept. 7. History of colostomy for ruptured viscus. 8. History of symptomatic bradycardia.  I am holding off patient's buspirone gabapentin and Cozaar.  Most coagulopathy was held because of the mental status changes.  May discharge when clinically appropriate.  ARB may need to be withheld because of chronic and disease now presenting with worsening renal function and hyperkalemia.   DVT prophylaxis: Heparin. Code Status: Full code confirmed with patient's granddaughter. Family Communication: [name]. Disposition Plan: Home. Consults called: None. Admission status: Inpatient.   Rise Patience  MD [facility] Pager [phone].  If 7PM-7AM, please contact night-coverage [email or web address] Password [email or web address]  05/10/2019, 8:57 PM

## 2019-05-10 NOTE — ED Notes (Signed)
[phone] pts granddaughter Alverda Skeans wants an update

## 2019-05-10 NOTE — ED Triage Notes (Addendum)
Pt bib ems, lethaqrgic for the last 2 days w/ weakness, pt was found responsive in a semi conscious state, verbal with arousal. Not able to answer questions. pupils pinpricks. No known narcotic use, 0.5 narcan give. Pt became more alert  116/72 bp Hr 60 paced rr14 98%o2 ra 170 cbg 98.2*F

## 2019-05-11 DIAGNOSIS — N1832 Chronic kidney disease, stage 3b: Secondary | ICD-10-CM | POA: Diagnosis not present

## 2019-05-11 DIAGNOSIS — N184 Chronic kidney disease, stage 4 (severe): Secondary | ICD-10-CM | POA: Diagnosis present

## 2019-05-11 DIAGNOSIS — E875 Hyperkalemia: Secondary | ICD-10-CM | POA: Diagnosis present

## 2019-05-11 DIAGNOSIS — Z7984 Long term (current) use of oral hypoglycemic drugs: Secondary | ICD-10-CM | POA: Diagnosis not present

## 2019-05-11 DIAGNOSIS — Z933 Colostomy status: Secondary | ICD-10-CM | POA: Diagnosis not present

## 2019-05-11 DIAGNOSIS — E860 Dehydration: Secondary | ICD-10-CM | POA: Diagnosis present

## 2019-05-11 DIAGNOSIS — G9340 Encephalopathy, unspecified: Secondary | ICD-10-CM | POA: Diagnosis not present

## 2019-05-11 DIAGNOSIS — Z87891 Personal history of nicotine dependence: Secondary | ICD-10-CM | POA: Diagnosis not present

## 2019-05-11 DIAGNOSIS — N390 Urinary tract infection, site not specified: Secondary | ICD-10-CM | POA: Diagnosis present

## 2019-05-11 DIAGNOSIS — Z20822 Contact with and (suspected) exposure to covid-19: Secondary | ICD-10-CM | POA: Diagnosis present

## 2019-05-11 DIAGNOSIS — Z79899 Other long term (current) drug therapy: Secondary | ICD-10-CM | POA: Diagnosis not present

## 2019-05-11 DIAGNOSIS — N179 Acute kidney failure, unspecified: Secondary | ICD-10-CM | POA: Diagnosis present

## 2019-05-11 DIAGNOSIS — J449 Chronic obstructive pulmonary disease, unspecified: Secondary | ICD-10-CM | POA: Diagnosis present

## 2019-05-11 DIAGNOSIS — B965 Pseudomonas (aeruginosa) (mallei) (pseudomallei) as the cause of diseases classified elsewhere: Secondary | ICD-10-CM | POA: Diagnosis present

## 2019-05-11 DIAGNOSIS — Z90710 Acquired absence of both cervix and uterus: Secondary | ICD-10-CM | POA: Diagnosis not present

## 2019-05-11 DIAGNOSIS — F0390 Unspecified dementia without behavioral disturbance: Secondary | ICD-10-CM | POA: Diagnosis present

## 2019-05-11 DIAGNOSIS — D689 Coagulation defect, unspecified: Secondary | ICD-10-CM | POA: Diagnosis present

## 2019-05-11 DIAGNOSIS — G9341 Metabolic encephalopathy: Secondary | ICD-10-CM | POA: Diagnosis present

## 2019-05-11 DIAGNOSIS — I129 Hypertensive chronic kidney disease with stage 1 through stage 4 chronic kidney disease, or unspecified chronic kidney disease: Secondary | ICD-10-CM | POA: Diagnosis present

## 2019-05-11 DIAGNOSIS — R5381 Other malaise: Secondary | ICD-10-CM | POA: Diagnosis present

## 2019-05-11 DIAGNOSIS — E1140 Type 2 diabetes mellitus with diabetic neuropathy, unspecified: Secondary | ICD-10-CM | POA: Diagnosis present

## 2019-05-11 DIAGNOSIS — Z833 Family history of diabetes mellitus: Secondary | ICD-10-CM | POA: Diagnosis not present

## 2019-05-11 DIAGNOSIS — E1122 Type 2 diabetes mellitus with diabetic chronic kidney disease: Secondary | ICD-10-CM | POA: Diagnosis present

## 2019-05-11 LAB — CBC
HCT: 35.1 % — ABNORMAL LOW (ref 36.0–46.0)
Hemoglobin: 11 g/dL — ABNORMAL LOW (ref 12.0–15.0)
MCH: 30.2 pg (ref 26.0–34.0)
MCHC: 31.3 g/dL (ref 30.0–36.0)
MCV: 96.4 fL (ref 80.0–100.0)
Platelets: 211 10*3/uL (ref 150–400)
RBC: 3.64 MIL/uL — ABNORMAL LOW (ref 3.87–5.11)
RDW: 12.8 % (ref 11.5–15.5)
WBC: 6.1 10*3/uL (ref 4.0–10.5)
nRBC: 0 % (ref 0.0–0.2)

## 2019-05-11 LAB — GLUCOSE, CAPILLARY
Glucose-Capillary: 91 mg/dL (ref 70–99)
Glucose-Capillary: 161 mg/dL — ABNORMAL HIGH (ref 70–99)
Glucose-Capillary: 113 mg/dL — ABNORMAL HIGH (ref 70–99)
Glucose-Capillary: 128 mg/dL — ABNORMAL HIGH (ref 70–99)

## 2019-05-11 LAB — BASIC METABOLIC PANEL
Anion gap: 11 (ref 5–15)
BUN: 41 mg/dL — ABNORMAL HIGH (ref 8–23)
CO2: 19 mmol/L — ABNORMAL LOW (ref 22–32)
Calcium: 8.5 mg/dL — ABNORMAL LOW (ref 8.9–10.3)
Chloride: 109 mmol/L (ref 98–111)
Creatinine, Ser: 2.15 mg/dL — ABNORMAL HIGH (ref 0.44–1.00)
GFR calc Af Amer: 24 mL/min — ABNORMAL LOW (ref >60)
GFR calc non Af Amer: 21 mL/min — ABNORMAL LOW (ref >60)
Glucose, Bld: 157 mg/dL — ABNORMAL HIGH (ref 70–99)
Potassium: 5.8 mmol/L — ABNORMAL HIGH (ref 3.5–5.1)
Sodium: 139 mmol/L (ref 135–145)

## 2019-05-11 LAB — SARS CORONAVIRUS 2 (TAT 6-24 HRS): SARS Coronavirus 2: NEGATIVE

## 2019-05-11 MED ORDER — HYDRALAZINE HCL 20 MG/ML IJ SOLN: 10.0000 mg | INTRAMUSCULAR | Status: DC | PRN

## 2019-05-11 MED ORDER — SODIUM ZIRCONIUM CYCLOSILICATE 10 G PO PACK
10.0000 g | PACK | Freq: Once | ORAL | Status: AC
  Administered 2019-05-11: 10 g via ORAL
  Filled 2019-05-11: qty 1

## 2019-05-11 NOTE — Progress Notes (Signed)
Progress Note    [name]  [id number] DOB: 04-Apr-1933  DOA: 05/10/2019 PCP: Leeroy Cha, MD    Brief Narrative:     Medical records reviewed and are as summarized below:  [name] is an 86 y.o. female with history of chronic kidney disease stage IV (baseline creatinine is around 1.8), hypertension, dementia, history of colostomy for perforated viscus, diabetes mellitus type 2, history of symptomatic bradycardia was brought to the ER after patient's granddaughter found the patient was increasingly lethargic last 2 days.  6 ER visits in last 6 months  Assessment/Plan:   Principal Problem:   ARF (acute renal failure) (HCC) Active Problems:   Diabetes mellitus with complication (HCC)   Acute encephalopathy   Dementia (HCC)   Essential hypertension   COPD (chronic obstructive pulmonary disease) (HCC)   CKD (chronic kidney disease) stage 3, GFR 30-59 ml/min   Acute lower UTI   Acute encephalopathy could be metabolic and possibly infectious.  - Patient is afebrile.   -UA is concerning for UTI: I have added urine culture this was not done prior -Continue IV antibiotics - Patient has worsening renal function with patient being on gabapentin will hold gabapentin.  Acute on chronic kidney disease stage IV with hyperkalemia  -hold ARB.  -Repeat in a.m.  Hypertension - holding ARB  -Monitor closely  Diabetes mellitus type 2  -sliding scale coverage.  History of neuropathy on gabapentin  -on hold due to worsening renal function and mental status changes.  Dementia  -Namenda and Aricept.  History of colostomy for ruptured viscus.  History of symptomatic bradycardia.    Family Communication/Anticipated D/C date and plan/Code Status   DVT prophylaxis: Heparin Code Status: Full Code.  Family Communication:  Disposition Plan: Needs continued IV antibiotics until reliably taking oral medications, will continue IV fluids as well and request  PT/OT eval   Medical Consultants:    None.    Subjective:   Not very hungry  Objective:    Vitals:   05/10/19 2045 05/10/19 2215 05/11/19 0538 05/11/19 0902  BP: (!) 142/72 108/87 123/71 115/73  Pulse: (!) 59 60 60 65  Resp: 11 16 17 16   Temp:  98.2 F (36.8 C) (!) 97.5 F (36.4 C) 97.8 F (36.6 C)  TempSrc:  Oral Oral Oral  SpO2: 97% 100% 98% 100%  Weight:  68 kg    Height:        Intake/Output Summary (Last 24 hours) at 05/11/2019 1141 Last data filed at 05/11/2019 0600 Gross per 24 hour  Intake 1201.69 ml  Output 25 ml  Net 1176.69 ml   Filed Weights   05/10/19 1623 05/10/19 2215  Weight: 72.6 kg 68 kg    Exam: In bed, awake and conversant, oriented to place and name Regular rate and rhythm Positive bowel sounds soft nontender Mucous membranes dry Moves all 4 extremities without issues  Data Reviewed:   I have personally reviewed following labs and imaging studies:  Labs: Labs show the following:   Basic Metabolic Panel: Recent Labs  Lab 05/10/19 1701 05/11/19 0337  NA 139 139  K 6.4* 5.8*  CL 106 109  CO2 20* 19*  GLUCOSE 148* 157*  BUN 42* 41*  CREATININE 2.98* 2.15*  CALCIUM 9.3 8.5*   GFR Estimated Creatinine Clearance: 18.3 mL/min (A) (by C-G formula based on SCr of 2.15 mg/dL (H)). Liver Function Tests: Recent Labs  Lab 05/10/19 1701  AST 16  ALT 10  ALKPHOS 79  BILITOT 0.5  PROT 6.9  ALBUMIN 3.4*   No results for input(s): LIPASE, AMYLASE in the last 168 hours. No results for input(s): AMMONIA in the last 168 hours. Coagulation profile Recent Labs  Lab 05/10/19 1747  INR 1.0    CBC: Recent Labs  Lab 05/10/19 1701 05/11/19 0653  WBC 8.6 6.1  NEUTROABS 6.0  --   HGB 12.3 11.0*  HCT 40.6 35.1*  MCV 99.0 96.4  PLT 207 211   Cardiac Enzymes: No results for input(s): CKTOTAL, CKMB, CKMBINDEX, TROPONINI in the last 168 hours. BNP (last 3 results) No results for input(s): PROBNP in the last 8760  hours. CBG: Recent Labs  Lab 05/10/19 2252 05/11/19 0711 05/11/19 1110  GLUCAP 103* 91 161*   D-Dimer: No results for input(s): DDIMER in the last 72 hours. Hgb A1c: No results for input(s): HGBA1C in the last 72 hours. Lipid Profile: No results for input(s): CHOL, HDL, LDLCALC, TRIG, CHOLHDL, LDLDIRECT in the last 72 hours. Thyroid function studies: No results for input(s): TSH, T4TOTAL, T3FREE, THYROIDAB in the last 72 hours.  Invalid input(s): FREET3 Anemia work up: No results for input(s): VITAMINB12, FOLATE, FERRITIN, TIBC, IRON, RETICCTPCT in the last 72 hours. Sepsis Labs: Recent Labs  Lab 05/10/19 1701 05/11/19 0653  WBC 8.6 6.1    Microbiology Recent Results (from the past 240 hour(s))  SARS CORONAVIRUS 2 (TAT 6-24 HRS) Nasopharyngeal Nasopharyngeal Swab     Status: None   Collection Time: 05/10/19  8:35 PM   Specimen: Nasopharyngeal Swab  Result Value Ref Range Status   SARS Coronavirus 2 NEGATIVE NEGATIVE Final    Comment: (NOTE) SARS-CoV-2 target nucleic acids are NOT DETECTED. The SARS-CoV-2 RNA is generally detectable in upper and lower respiratory specimens during the acute phase of infection. Negative results do not preclude SARS-CoV-2 infection, do not rule out co-infections with other pathogens, and should not be used as the sole basis for treatment or other patient management decisions. Negative results must be combined with clinical observations, patient history, and epidemiological information. The expected result is Negative. Fact Sheet for Patients: SugarRoll.be Fact Sheet for Healthcare Providers: https://www.woods-mathews.com/ This test is not yet approved or cleared by the [place] FDA and  has been authorized for detection and/or diagnosis of SARS-CoV-2 by FDA under an Emergency Use Authorization (EUA). This EUA will remain  in effect (meaning this test can be used) for the duration of  the COVID-19 declaration under Section 56 4(b)(1) of the Act, 21 U.S.C. section 360bbb-3(b)(1), unless the authorization is terminated or revoked sooner. Performed at [facility], [place] [address] [place], [place] [address]     Procedures and diagnostic studies:  CT HEAD WO CONTRAST  Result Date: 05/10/2019 CLINICAL DATA:  Mental status change with lethargy and weakness for 2 days. Some reported response to Narcan. EXAM: CT HEAD WITHOUT CONTRAST TECHNIQUE: Contiguous axial images were obtained from the base of the skull through the vertex without intravenous contrast. COMPARISON:  CT head 01/28/2019 FINDINGS: Brain: There is no evidence of acute intracranial hemorrhage, mass lesion, brain edema or extra-axial fluid collection. Stable atrophy with prominence of the ventricles and subarachnoid spaces. 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 There is no CT evidence of acute cortical infarction. Vascular: Intracranial vascular calcifications. No hyperdense vessel identified. Skull: Negative for fracture or focal lesion. Sinuses/Orbits: Chronic sinusitis again noted with diffuse mucosal thickening throughout the frontal, ethmoid, sphenoid and maxillary sinuses, similar to previous study. There is diffuse nodularity of  the nasal turbinates. Associated chronic osseous thinning without acute bone destruction. No significant orbital findings. Other: None. IMPRESSION: 1. No acute intracranial findings. 2. 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 3. Chronic sinusitis. Electronically Signed   By: Richardean Sale M.D.   On: 05/10/2019 20:12   DG Chest Portable 1 View  Result Date: 05/10/2019 CLINICAL DATA:  Weakness. EXAM: PORTABLE CHEST 1 VIEW COMPARISON:  04/02/2019 FINDINGS: The lungs are clear without focal pneumonia, edema, pneumothorax or pleural effusion. Interstitial markings are diffusely coarsened with chronic features. The cardio pericardial silhouette is enlarged. Left  permanent pacemaker again noted. Bones are diffusely demineralized. Telemetry leads overlie the chest. IMPRESSION: Stable.  No acute findings. Electronically Signed   By: Misty Stanley M.D.   On: 05/10/2019 17:24    Medications:   . donepezil  10 mg Oral QHS  . escitalopram  10 mg Oral QHS  . heparin  5,000 Units Subcutaneous Q8H  . insulin aspart  0-6 Units Subcutaneous TID WC  . memantine  28 mg Oral Daily   Continuous Infusions: . sodium chloride 50 mL/hr at 05/10/19 2246  . cefTRIAXone (ROCEPHIN)  IV Stopped (05/10/19 2317)     LOS: 0 days   Geradine Girt  [facility]   How to contact the [facility] Attending or Consulting provider [place] or covering provider during after hours [place], for this patient?  1. Check the care team in [facility] and look for a) attending/consulting TRH provider listed and b) the [facility] team listed 2. Log into [email or web address] and use [place]'s universal password to access. If you do not have the password, please contact the hospital operator. 3. Locate the [facility] provider you are looking for under [facility] and page to a number that you can be directly reached. 4. If you still have difficulty reaching the provider, please page the [facility] (Director on Call) for the Hospitalists listed on amion for assistance.  05/11/2019, 11:41 AM

## 2019-05-12 DIAGNOSIS — N1832 Chronic kidney disease, stage 3b: Secondary | ICD-10-CM

## 2019-05-12 LAB — BASIC METABOLIC PANEL
Anion gap: 7 (ref 5–15)
BUN: 28 mg/dL — ABNORMAL HIGH (ref 8–23)
CO2: 23 mmol/L (ref 22–32)
Calcium: 8.4 mg/dL — ABNORMAL LOW (ref 8.9–10.3)
Chloride: 111 mmol/L (ref 98–111)
Creatinine, Ser: 1.45 mg/dL — ABNORMAL HIGH (ref 0.44–1.00)
GFR calc Af Amer: 39 mL/min — ABNORMAL LOW (ref >60)
GFR calc non Af Amer: 33 mL/min — ABNORMAL LOW (ref >60)
Glucose, Bld: 115 mg/dL — ABNORMAL HIGH (ref 70–99)
Potassium: 4.7 mmol/L (ref 3.5–5.1)
Sodium: 141 mmol/L (ref 135–145)

## 2019-05-12 LAB — GLUCOSE, CAPILLARY
Glucose-Capillary: 94 mg/dL (ref 70–99)
Glucose-Capillary: 115 mg/dL — ABNORMAL HIGH (ref 70–99)
Glucose-Capillary: 114 mg/dL — ABNORMAL HIGH (ref 70–99)
Glucose-Capillary: 297 mg/dL — ABNORMAL HIGH (ref 70–99)

## 2019-05-12 LAB — CBC
HCT: 32.7 % — ABNORMAL LOW (ref 36.0–46.0)
Hemoglobin: 10.5 g/dL — ABNORMAL LOW (ref 12.0–15.0)
MCH: 30.3 pg (ref 26.0–34.0)
MCHC: 32.1 g/dL (ref 30.0–36.0)
MCV: 94.2 fL (ref 80.0–100.0)
Platelets: 222 10*3/uL (ref 150–400)
RBC: 3.47 MIL/uL — ABNORMAL LOW (ref 3.87–5.11)
RDW: 12.6 % (ref 11.5–15.5)
WBC: 6.2 10*3/uL (ref 4.0–10.5)
nRBC: 0 % (ref 0.0–0.2)

## 2019-05-12 MED ORDER — CETAPHIL MOISTURIZING EX LOTN
TOPICAL_LOTION | Freq: Two times a day (BID) | CUTANEOUS | Status: DC
  Filled 2019-05-12: qty 473

## 2019-05-12 MED ORDER — HALOPERIDOL LACTATE 5 MG/ML IJ SOLN
5.0000 mg | Freq: Once | INTRAMUSCULAR | Status: AC
  Administered 2019-05-12: 5 mg via INTRAVENOUS
  Filled 2019-05-12: qty 1

## 2019-05-12 NOTE — Progress Notes (Signed)
Progress Note    [name]  [id number] DOB: 1933-08-01  DOA: 05/10/2019 PCP: Sandy Cha, MD    Brief Narrative:     Medical records reviewed and are as summarized below:  [name] is an 86 y.o. female with history of chronic kidney disease stage IV (baseline creatinine is around 1.8), hypertension, dementia, history of colostomy for perforated viscus, diabetes mellitus type 2, history of symptomatic bradycardia was brought to the ER after patient's granddaughter found the patient was increasingly lethargic last 2 days.  6 ER visits in last 6 months  Assessment/Plan:   Principal Problem:   ARF (acute renal failure) (HCC) Active Problems:   Diabetes mellitus with complication (HCC)   Acute encephalopathy   Dementia (HCC)   AKI (acute kidney injury) ([place])   Essential hypertension   COPD (chronic obstructive pulmonary disease) (HCC)   CKD (chronic kidney disease) stage 3, GFR 30-59 ml/min   Acute lower UTI   UTI (urinary tract infection)   Acute encephalopathy could be metabolic and possibly infectious.  - Patient is afebrile.   -Resolved -UA is concerning for UTI: I have added urine culture this was not done prior to antibiotics (appears to be growing Pseudomonas) -Continue IV antibiotics - Patient has worsening renal function with patient being on gabapentin will hold gabapentin.  Acute on chronic kidney disease stage IV with hyperkalemia  -Hyperkalemia resolved -hold ARB, most likely does not need resumption upon discharge -Creatinine much improved, will DC IV fluids  Hypertension - holding ARB  -Monitor closely: Blood pressure appears controlled  Diabetes mellitus type 2  -sliding scale coverage.  History of neuropathy on gabapentin  -on hold due to worsening renal function and mental status changes.  -Patient not complaining of any pain  Dementia  -Namenda and Aricept.  History of colostomy for ruptured viscus.  History of  symptomatic bradycardia-heart rate on telemetry here stable  PT/OT recommends home health but with 24-hour care which patient does not have so I think patient could benefit from a short stay in a skilled nursing facility as granddaughter states she is fallen several times  Family Communication/Anticipated D/C date and plan/Code Status   DVT prophylaxis: Heparin Code Status: Full Code.  Family Communication:  Disposition Plan: Spoke with granddaughter on the phone and she states that she is not there with her grandmother 24/7 and is concerned about her coming home and think she would benefit from a brief stay in the skilled nursing facility to work on balance and strength  Medical Consultants:    None.    Subjective:   No overnight events, denies pain  Objective:    Vitals:   05/11/19 1759 05/11/19 2031 05/12/19 0520 05/12/19 0857  BP: (!) 148/67 (!) 159/75 (!) 153/73 131/67  Pulse: 60 60 (!) 59 (!) 59  Resp: 18 16 16 18   Temp: 98 F (36.7 C) 97.7 F (36.5 C) 98 F (36.7 C) 97.8 F (36.6 C)  TempSrc: Oral Oral Oral Oral  SpO2: 98% 100% 93% 98%  Weight:  68.2 kg    Height:        Intake/Output Summary (Last 24 hours) at 05/12/2019 1030 Last data filed at 05/12/2019 0715 Gross per 24 hour  Intake 1424.85 ml  Output 775 ml  Net 649.85 ml   Filed Weights   05/10/19 1623 05/10/19 2215 05/11/19 2031  Weight: 72.6 kg 68 kg 68.2 kg    Exam: In bed, pleasant and cooperative Regular rate and rhythm  No increased work of breathing Dry skin but no edema  Data Reviewed:   I have personally reviewed following labs and imaging studies:  Labs: Labs show the following:   Basic Metabolic Panel: Recent Labs  Lab 05/10/19 1701 05/10/19 1701 05/11/19 0337 05/12/19 0547  NA 139  --  139 141  K 6.4*   < > 5.8* 4.7  CL 106  --  109 111  CO2 20*  --  19* 23  GLUCOSE 148*  --  157* 115*  BUN 42*  --  41* 28*  CREATININE 2.98*  --  2.15* 1.45*  CALCIUM 9.3  --  8.5*  8.4*   < > = values in this interval not displayed.   GFR Estimated Creatinine Clearance: 27.2 mL/min (A) (by C-G formula based on SCr of 1.45 mg/dL (H)). Liver Function Tests: Recent Labs  Lab 05/10/19 1701  AST 16  ALT 10  ALKPHOS 79  BILITOT 0.5  PROT 6.9  ALBUMIN 3.4*   No results for input(s): LIPASE, AMYLASE in the last 168 hours. No results for input(s): AMMONIA in the last 168 hours. Coagulation profile Recent Labs  Lab 05/10/19 1747  INR 1.0    CBC: Recent Labs  Lab 05/10/19 1701 05/11/19 0653 05/12/19 0547  WBC 8.6 6.1 6.2  NEUTROABS 6.0  --   --   HGB 12.3 11.0* 10.5*  HCT 40.6 35.1* 32.7*  MCV 99.0 96.4 94.2  PLT 207 211 222   Cardiac Enzymes: No results for input(s): CKTOTAL, CKMB, CKMBINDEX, TROPONINI in the last 168 hours. BNP (last 3 results) No results for input(s): PROBNP in the last 8760 hours. CBG: Recent Labs  Lab 05/11/19 0711 05/11/19 1110 05/11/19 1700 05/11/19 2049 05/12/19 0657  GLUCAP 91 161* 113* 128* 94   D-Dimer: No results for input(s): DDIMER in the last 72 hours. Hgb A1c: No results for input(s): HGBA1C in the last 72 hours. Lipid Profile: No results for input(s): CHOL, HDL, LDLCALC, TRIG, CHOLHDL, LDLDIRECT in the last 72 hours. Thyroid function studies: No results for input(s): TSH, T4TOTAL, T3FREE, THYROIDAB in the last 72 hours.  Invalid input(s): FREET3 Anemia work up: No results for input(s): VITAMINB12, FOLATE, FERRITIN, TIBC, IRON, RETICCTPCT in the last 72 hours. Sepsis Labs: Recent Labs  Lab 05/10/19 1701 05/11/19 0653 05/12/19 0547  WBC 8.6 6.1 6.2    Microbiology Recent Results (from the past 240 hour(s))  SARS CORONAVIRUS 2 (TAT 6-24 HRS) Nasopharyngeal Nasopharyngeal Swab     Status: None   Collection Time: 05/10/19  8:35 PM   Specimen: Nasopharyngeal Swab  Result Value Ref Range Status   SARS Coronavirus 2 NEGATIVE NEGATIVE Final    Comment: (NOTE) SARS-CoV-2 target nucleic acids are NOT  DETECTED. The SARS-CoV-2 RNA is generally detectable in upper and lower respiratory specimens during the acute phase of infection. Negative results do not preclude SARS-CoV-2 infection, do not rule out co-infections with other pathogens, and should not be used as the sole basis for treatment or other patient management decisions. Negative results must be combined with clinical observations, patient history, and epidemiological information. The expected result is Negative. Fact Sheet for Patients: SugarRoll.be Fact Sheet for Healthcare Providers: https://www.woods-mathews.com/ This test is not yet approved or cleared by the [place] FDA and  has been authorized for detection and/or diagnosis of SARS-CoV-2 by FDA under an Emergency Use Authorization (EUA). This EUA will remain  in effect (meaning this test can be used) for the duration of the COVID-19 declaration under  Section 56 4(b)(1) of the Act, 21 U.S.C. section 360bbb-3(b)(1), unless the authorization is terminated or revoked sooner. Performed at [facility], [place] [address] [place], [place] [address]   Culture, Urine     Status: Abnormal (Preliminary result)   Collection Time: 05/11/19  9:18 AM   Specimen: Urine, Random  Result Value Ref Range Status   Specimen Description URINE, RANDOM  Final   Special Requests NONE  Final   Culture (A)  Final    60,000 COLONIES/mL PSEUDOMONAS AERUGINOSA SUSCEPTIBILITIES TO FOLLOW Performed at [facility], [place] [address], [place], [place] [address]    Report Status PENDING  Incomplete    Procedures and diagnostic studies:  CT HEAD WO CONTRAST  Result Date: 05/10/2019 CLINICAL DATA:  Mental status change with lethargy and weakness for 2 days. Some reported response to Narcan. EXAM: CT HEAD WITHOUT CONTRAST TECHNIQUE: Contiguous axial images were obtained from the base of the skull through the vertex without intravenous contrast.  COMPARISON:  CT head 01/28/2019 FINDINGS: Brain: There is no evidence of acute intracranial hemorrhage, mass lesion, brain edema or extra-axial fluid collection. Stable atrophy with prominence of the ventricles and subarachnoid spaces. 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 There is no CT evidence of acute cortical infarction. Vascular: Intracranial vascular calcifications. No hyperdense vessel identified. Skull: Negative for fracture or focal lesion. Sinuses/Orbits: Chronic sinusitis again noted with diffuse mucosal thickening throughout the frontal, ethmoid, sphenoid and maxillary sinuses, similar to previous study. There is diffuse nodularity of the nasal turbinates. Associated chronic osseous thinning without acute bone destruction. No significant orbital findings. Other: None. IMPRESSION: 1. No acute intracranial findings. 2. 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 3. Chronic sinusitis. Electronically Signed   By: Richardean Sale M.D.   On: 05/10/2019 20:12   DG Chest Portable 1 View  Result Date: 05/10/2019 CLINICAL DATA:  Weakness. EXAM: PORTABLE CHEST 1 VIEW COMPARISON:  04/02/2019 FINDINGS: The lungs are clear without focal pneumonia, edema, pneumothorax or pleural effusion. Interstitial markings are diffusely coarsened with chronic features. The cardio pericardial silhouette is enlarged. Left permanent pacemaker again noted. Bones are diffusely demineralized. Telemetry leads overlie the chest. IMPRESSION: Stable.  No acute findings. Electronically Signed   By: Misty Stanley M.D.   On: 05/10/2019 17:24    Medications:   . donepezil  10 mg Oral QHS  . escitalopram  10 mg Oral QHS  . heparin  5,000 Units Subcutaneous Q8H  . insulin aspart  0-6 Units Subcutaneous TID WC  . memantine  28 mg Oral Daily   Continuous Infusions: . cefTRIAXone (ROCEPHIN)  IV 1 g (05/11/19 2149)     LOS: 1 day   Geradine Girt  [facility]   How to contact the [facility] Attending or  Consulting provider [place] or covering provider during after hours [place], for this patient?  1. Check the care team in [facility] and look for a) attending/consulting TRH provider listed and b) the [facility] team listed 2. Log into [email or web address] and use [place]'s universal password to access. If you do not have the password, please contact the hospital operator. 3. Locate the [facility] provider you are looking for under [facility] and page to a number that you can be directly reached. 4. If you still have difficulty reaching the provider, please page the [facility] (Director on Call) for the Hospitalists listed on amion for assistance.  05/12/2019, 10:30 AM

## 2019-05-12 NOTE — Plan of Care (Signed)
  Problem: Clinical Measurements: Goal: Diagnostic test results will improve Outcome: Completed/Met Goal: Respiratory complications will improve Outcome: Completed/Met Goal: Cardiovascular complication will be avoided Outcome: Completed/Met   Problem: Activity: Goal: Risk for activity intolerance will decrease Outcome: Completed/Met   Problem: Nutrition: Goal: Adequate nutrition will be maintained Outcome: Completed/Met   Problem: Coping: Goal: Level of anxiety will decrease Outcome: Completed/Met   Problem: Pain Managment: Goal: General experience of comfort will improve Outcome: Completed/Met   Problem: Skin Integrity: Goal: Risk for impaired skin integrity will decrease Outcome: Completed/Met

## 2019-05-12 NOTE — Progress Notes (Signed)
Patient very confused and getting agitated. Patient states she needed to clean the cath lab and OR. Unable to reorient pt. Non compliant with safety measures. Up at the nurses station at present. " I got to finish cleaning."  Will update MD for agitation.

## 2019-05-12 NOTE — Evaluation (Signed)
Physical Therapy Evaluation Patient Details Name: JOANMARIE SIDDIQ MRN: [id number] DOB: 11-Feb-1934 Today's Date: 05/12/2019   History of Present Illness  86 y.o. female with history of chronic kidney disease stage IV (baseline creatinine is around 1.8), hypertension, dementia, history of colostomy for perforated viscus, diabetes mellitus type 2, history of symptomatic bradycardia was brought to the ER after patient's granddaughter found the patient was increasingly lethargic.    Clinical Impression  Pt admitted with above diagnosis. PTA pt lived at home with her granddaughter, mod I mobility using RW. On eval, pt required supervision bed mobility, min/min guard assist transfers and min guard assist ambulation 100' with RW.  Pt currently with functional limitations due to the deficits listed below (see PT Problem List). Pt will benefit from skilled PT to increase their independence and safety with mobility to allow discharge to the venue listed below.       Follow Up Recommendations Home health PT;Supervision/Assistance - 24 hour    Equipment Recommendations  None recommended by PT    Recommendations for Other Services       Precautions / Restrictions Precautions Precautions: Fall      Mobility  Bed Mobility Overal bed mobility: Needs Assistance Bed Mobility: Supine to Sit;Sit to Supine     Supine to sit: Supervision;HOB elevated Sit to supine: Supervision;HOB elevated   General bed mobility comments: supervision for safety, +rail, increased time  Transfers Overall transfer level: Needs assistance Equipment used: Rolling walker (2 wheeled) Transfers: Sit to/from [facility] Sit to Stand: Min guard;Min assist Stand pivot transfers: Min guard       General transfer comment: multi sit to stands from EOB and toilet, assist ranging from min guard to min assist. Cues for hand placement. Increased time.  Ambulation/Gait Ambulation/Gait assistance: Min  guard Gait Distance (Feet): 100 Feet Assistive device: Rolling walker (2 wheeled) Gait Pattern/deviations: Step-through pattern;Decreased stride length;Trunk flexed Gait velocity: decreased Gait velocity interpretation: <1.31 ft/sec, indicative of household ambulator General Gait Details: RW in hallway. No AD in room but pt furniture walking.  Stairs            Wheelchair Mobility    Modified Rankin (Stroke Patients Only)       Balance Overall balance assessment: Needs assistance Sitting-balance support: No upper extremity supported;Feet supported Sitting balance-Leahy Scale: Good     Standing balance support: During functional activity;Single extremity supported;No upper extremity supported Standing balance-Leahy Scale: Poor Standing balance comment: able to ambulate in room furniture walking, RW for hallway                             Pertinent Vitals/Pain Pain Assessment: No/denies pain    Home Living Family/patient expects to be discharged to:: Private residence Living Arrangements: Other relatives(granddaughter) Available Help at Discharge: Family;Available 24 hours/day Type of Home: House Home Access: Stairs to enter Entrance Stairs-Rails: Psychiatric nurse of Steps: 3 Home Layout: One level Home Equipment: Walker - 2 wheels      Prior Function Level of Independence: Needs assistance   Gait / Transfers Assistance Needed: ambulates with RW  ADL's / Homemaking Assistance Needed: Pt reports set up assist for bathing.  Comments: Granddaughter assists as needed with ADLs and iADLs.     Hand Dominance   Dominant Hand: Right    Extremity/Trunk Assessment   Upper Extremity Assessment Upper Extremity Assessment: Defer to OT evaluation    Lower Extremity Assessment Lower Extremity Assessment: Generalized weakness  Cervical / Trunk Assessment Cervical / Trunk Assessment: Kyphotic  Communication   Communication: No  difficulties  Cognition Arousal/Alertness: Awake/alert Behavior During Therapy: WFL for tasks assessed/performed Overall Cognitive Status: History of cognitive impairments - at baseline                                 General Comments: A&Ox 3. Follows commands. Appropriate. Dementia at baseline.      General Comments      Exercises     Assessment/Plan    PT Assessment Patient needs continued PT services  PT Problem List Decreased strength;Decreased mobility;Decreased balance;Decreased activity tolerance       PT Treatment Interventions Therapeutic activities;Gait training;Therapeutic exercise;Patient/family education;Stair training;Balance training;Functional mobility training    PT Goals (Current goals can be found in the Care Plan section)  Acute Rehab PT Goals Patient Stated Goal: home PT Goal Formulation: With patient Time For Goal Achievement: 05/26/19 Potential to Achieve Goals: Good    Frequency Min 3X/week   Barriers to discharge        Co-evaluation               AM-PAC PT "6 Clicks" Mobility  Outcome Measure Help needed turning from your back to your side while in a flat bed without using bedrails?: None Help needed moving from lying on your back to sitting on the side of a flat bed without using bedrails?: A Little Help needed moving to and from a bed to a chair (including a wheelchair)?: A Little Help needed standing up from a chair using your arms (e.g., wheelchair or bedside chair)?: A Little Help needed to walk in hospital room?: A Little Help needed climbing 3-5 steps with a railing? : A Little 6 Click Score: 19    End of Session Equipment Utilized During Treatment: Gait belt Activity Tolerance: Patient tolerated treatment well Patient left: in bed;with call bell/phone within reach;with bed alarm set Nurse Communication: Mobility status PT Visit Diagnosis: Muscle weakness (generalized) (M62.81)    Time: [id number] PT Time  Calculation (min) (ACUTE ONLY): 18 min   Charges:   PT Evaluation $PT Eval Moderate Complexity: 1 Mod          Lorrin Goodell, PT  Office # [phone] Pager [phone]   Lorriane Shire 05/12/2019, 9:50 AM

## 2019-05-12 NOTE — Plan of Care (Signed)
  Problem: Clinical Measurements: Goal: Ability to maintain clinical measurements within normal limits will improve Outcome: Progressing   

## 2019-05-12 NOTE — Evaluation (Signed)
Occupational Therapy Evaluation Patient Details Name: [name] MRN: [id number] DOB: 06-28-1933 Today's Date: 05/12/2019    History of Present Illness 86 y.o. female with history of chronic kidney disease stage IV (baseline creatinine is around 1.8), hypertension, dementia, history of colostomy for perforated viscus, diabetes mellitus type 2, history of symptomatic bradycardia was brought to the ER after patient's granddaughter found the patient was increasingly lethargic.   Clinical Impression   Pt with decline in function and safety with ADLs and ADL mobility with impaired balance, strength and endurance. Pt lives at home with her grand daughter and she was independent with ADLs/selcare, set up for bathing, uses a RW and grand daughter can assist with ADLs and IADLs as needed. Pt currently requires sup with bed mobility to sit EOB, set up/sup with UB ADLs, min A with LB ADLs, min guard A with toileting and min - min guard A with mobility using RW. Pt would benefit from acute OT services to address impairments to maximize level of function and safety    Follow Up Recommendations  No OT follow up;Supervision/Assistance - 24 hour    Equipment Recommendations  None recommended by OT    Recommendations for Other Services       Precautions / Restrictions Precautions Precautions: Fall Restrictions Weight Bearing Restrictions: No      Mobility Bed Mobility Overal bed mobility: Needs Assistance Bed Mobility: Supine to Sit;Sit to Supine     Supine to sit: Supervision;HOB elevated Sit to supine: Supervision;HOB elevated   General bed mobility comments: supervision for safety, used rail, increased time  Transfers Overall transfer level: Needs assistance Equipment used: Rolling walker (2 wheeled) Transfers: Sit to/from [facility] Sit to Stand: Min guard;Min assist Stand pivot transfers: Min guard       General transfer comment: Cues for hand placement,  increased time.    Balance Overall balance assessment: Needs assistance Sitting-balance support: No upper extremity supported;Feet supported Sitting balance-Leahy Scale: Good     Standing balance support: During functional activity;Single extremity supported;No upper extremity supported Standing balance-Leahy Scale: Poor Standing balance comment: able to ambulate in room furniture walking, RW for hallway                           ADL either performed or assessed with clinical judgement   ADL Overall ADL's : Needs assistance/impaired Eating/Feeding: Independent;Sitting   Grooming: Wash/dry hands;Wash/dry face;Min guard;Standing   Upper Body Bathing: Set up;Supervision/ safety;Sitting   Lower Body Bathing: Minimal assistance   Upper Body Dressing : Set up;Supervision/safety;Sitting   Lower Body Dressing: Minimal assistance   Toilet Transfer: Minimal assistance;Min guard;Ambulation;RW;Cueing for safety;BSC   Toileting- Clothing Manipulation and Hygiene: Min guard;Sit to/from stand       Functional mobility during ADLs: Minimal assistance;Min guard;Rolling walker;Cueing for safety       Vision Patient Visual Report: No change from baseline       Perception     Praxis      Pertinent Vitals/Pain Pain Assessment: No/denies pain     Hand Dominance Right   Extremity/Trunk Assessment Upper Extremity Assessment Upper Extremity Assessment: Generalized weakness   Lower Extremity Assessment Lower Extremity Assessment: Defer to PT evaluation   Cervical / Trunk Assessment Cervical / Trunk Assessment: Kyphotic   Communication Communication Communication: No difficulties   Cognition Arousal/Alertness: Awake/alert Behavior During Therapy: WFL for tasks assessed/performed Overall Cognitive Status: History of cognitive impairments - at baseline  General Comments: A&Ox 3. Follows commands. Appropriate. Dementia at  baseline.   General Comments       Exercises     Shoulder Instructions      Home Living Family/patient expects to be discharged to:: Private residence Living Arrangements: Other relatives Available Help at Discharge: Family;Available 24 hours/day Type of Home: House Home Access: Stairs to enter [employer] of Steps: 3 Entrance Stairs-Rails: Right;Left Home Layout: One level     Bathroom Shower/Tub: Teacher, early years/pre: Standard Bathroom Accessibility: Yes   Home Equipment: Environmental consultant - 2 wheels          Prior Functioning/Environment Level of Independence: Needs assistance  Gait / Transfers Assistance Needed: ambulates with RW ADL's / Homemaking Assistance Needed: independent with ADLs/selfcare, set up for bathing   Comments: Granddaughter assists as needed with ADLs and iADLs.        OT Problem List: Decreased activity tolerance;Decreased knowledge of use of DME or AE;Decreased cognition;Decreased safety awareness      OT Treatment/Interventions: Self-care/ADL training;DME and/or AE instruction;Therapeutic activities;Patient/family education    OT Goals(Current goals can be found in the care plan section) Acute Rehab OT Goals Patient Stated Goal: home OT Goal Formulation: With patient Time For Goal Achievement: 05/26/19 Potential to Achieve Goals: Good ADL Goals Pt Will Perform Grooming: with supervision;with set-up;with modified independence;standing;with caregiver independent in assisting Pt Will Perform Upper Body Bathing: with set-up;with modified independence;with caregiver independent in assisting Pt Will Perform Lower Body Bathing: with supervision;with set-up;with modified independence;with caregiver independent in assisting Pt Will Perform Upper Body Dressing: with set-up;Independently;with caregiver independent in assisting Pt Will Perform Lower Body Dressing: with min guard assist;with supervision;with set-up;with caregiver  independent in assisting Pt Will Transfer to Toilet: with min guard assist;with supervision;ambulating Pt Will Perform Toileting - Clothing Manipulation and hygiene: with supervision;with modified independence;sit to/from stand;with caregiver independent in assisting  OT Frequency: Min 2X/week   Barriers to D/C:            Co-evaluation              AM-PAC OT "6 Clicks" Daily Activity     Outcome Measure Help from another person eating meals?: None Help from another person taking care of personal grooming?: A Little Help from another person toileting, which includes using toliet, bedpan, or urinal?: A Little Help from another person bathing (including washing, rinsing, drying)?: A Little Help from another person to put on and taking off regular upper body clothing?: A Little Help from another person to put on and taking off regular lower body clothing?: A Little 6 Click Score: 19   End of Session Equipment Utilized During Treatment: Gait belt;Rolling walker;Other (comment)(BSC)  Activity Tolerance: Patient tolerated treatment well Patient left: in bed;with call bell/phone within reach;with bed alarm set  OT Visit Diagnosis: Muscle weakness (generalized) (M62.81);Other symptoms and signs involving cognitive function                Time: 1113-1140 OT Time Calculation (min): 27 min Charges:  OT General Charges $OT Visit: 1 Visit OT Evaluation $OT Eval Moderate Complexity: 1 Mod OT Treatments $Self Care/Home Management : 8-22 mins    Britt Bottom 05/12/2019, 12:32 PM

## 2019-05-13 LAB — GLUCOSE, CAPILLARY
Glucose-Capillary: 188 mg/dL — ABNORMAL HIGH (ref 70–99)
Glucose-Capillary: 124 mg/dL — ABNORMAL HIGH (ref 70–99)

## 2019-05-13 LAB — URINE CULTURE: Culture: 60000 — AB

## 2019-05-13 MED ORDER — GABAPENTIN 100 MG PO CAPS: 100.0000 mg | ORAL_CAPSULE | Freq: Three times a day (TID) | ORAL | Status: DC

## 2019-05-13 MED ORDER — FOSFOMYCIN TROMETHAMINE 3 G PO PACK
3.0000 g | PACK | Freq: Once | ORAL | Status: AC
  Administered 2019-05-13: 3 g via ORAL
  Filled 2019-05-13: qty 3

## 2019-05-13 MED ORDER — AMLODIPINE BESYLATE 10 MG PO TABS: 10.0000 mg | ORAL_TABLET | Freq: Every day | ORAL | 1 refills | Status: DC

## 2019-05-13 NOTE — TOC Transition Note (Signed)
Transition of Care [facility]) - CM/SW Discharge Note   Patient Details  Name: DEBROAH ASHMAN MRN: [id number] Date of Birth: 01-02-1934  [facility] CM/SW Contact:  Carles Collet, RN Phone Number: 05/13/2019, 4:19 PM   Clinical Narrative:    Verna Czech, granddaughter. She would home w [facility] services. Alvis Lemmings accepted referral. Home address verified.  PTAR set up for transport.  [name] provided home number [phone], and cell [phone] for [facility] services.     Final next level of care: [place] Barriers to Discharge: No Barriers Identified   Patient Goals and CMS Choice Patient states their goals for this hospitalization and ongoing recovery are:: to go home CMS Medicare.gov Compare Post Acute Care list provided to:: Other (Comment Required) Choice offered to / list presented to : Adult Children  Discharge Placement                       Discharge Plan and Services                          HH Arranged: RN, PT, OT, Nurse's Aide [place] Agency: [place] Date [facility] [place] Agency Contacted: 05/13/19 Time [place]: [phone] Representative spoke with at [place]: cory  Social Determinants of Health ([place]) Interventions     Readmission Risk Interventions No flowsheet data found.

## 2019-05-13 NOTE — Progress Notes (Signed)
Pt is being discharged at this time via PTAR. PTAR had to call the police dept. To check the residence to make sure the daughter was home before taking the patient due to not being able to reach the daughter. Pt is alert and confused. Vitals are WNL. IV has already been removed.    Eleanora Neighbor, RN

## 2019-05-13 NOTE — Progress Notes (Signed)
Patient sitting at the nurses station for safety. Continue to get out of recliner. Alert  to self only .  Haldol 5 ml IV did not help. Will continue to monitor the patient.

## 2019-05-13 NOTE — Progress Notes (Signed)
Patient did not sleep last night. Patient at the nurses station on a recliner. She is alert to self only. She continue to want to go to cath lab and clean. Unable to redirect patient.

## 2019-05-13 NOTE — Discharge Summary (Signed)
Physician Discharge Summary  [name] [id number] DOB: 16-Oct-1933 DOA: 05/10/2019  PCP: Leeroy Cha, MD  Admit date: 05/10/2019 Discharge date: 05/13/2019  Admitted From: Home Disposition:  Home  Discharge Condition:Stable CODE STATUS:FULL Diet recommendation: Heart Healthy  Brief/Interim Summary: [name] is an 86 y.o. female withhistory of chronic kidney disease stage IV (baseline creatinine is around 1.8), hypertension, dementia, history of colostomy for perforated viscus, diabetes mellitus type 2, history of symptomatic bradycardia was brought to the ER after patient's granddaughter found the patient was increasingly lethargic last 2 days.  She had 6 ER visits in last 6 months.  She was in acute renal failure on presentation with creatinine of 2.9, potassium of 6.4.  She was given Lokelma.  UA was concerning for UTI.  She was started on IV fluids, antibiotics.  She was also taking gabapentin at home which might be contributing to the altered mental status. Her overall status has improved.  Kidney function has returned to baseline.  Urine culture showed Pseudomonas aeruginosa which was pansensitive and was treated with a dose of fosfomycin.  Currently her mental status has improved, she is confused at baseline.  PT/OT evaluated her and recommended home health.  Her granddaughter was requesting for possible placement in a skilled nursing facility but since PT/OT recommended home health, given her dementia and other comorbidities and advanced age ,she will be most likely custodial rather than rehabable.  After discussion with the daughter, we decided to discharge the patient home with home health.  She is hemodynamically stable for discharge today.  Following problems were addressed during her hospitalization:  Acute encephalopathy : Confused at baseline.  Deteriorated altered mental status most likely secondary to UTI, AKI .currently at baseline .  Dose of gabapentin  decreased to 100 mg 3 times a day  UTI:-UA was concerning for UTI.  Urine culture showed pansensitive Pseudomonas.  Treated with a dose of fosfomycin.  No leukocytosis or fever.  Acute on chronic kidney disease stage IV with hyperkalemia: Hyperkalemia resolved She was on losartan at home which were discontinued.  Started on amlodipine.  Hypertension: Losartan was held.  Started on amlodipine   diabetes mellitus type 2:  Resume home regimen  History of neuropathy on gabapentin  Regiment reduced dose  Dementia  Continue supportive care.  Continue Namenda and Aricept.  History ruptured viscus: Has colostomy bag.  Debility/deconditioning: Her granddaughter was requesting for possible placement in a skilled nursing facility but since PT/OT recommended home health, given her dementia and other comorbidities and advanced age ,she will be most likely custodial rather than rehabable.  Discharge Diagnoses:  Principal Problem:   ARF (acute renal failure) (HCC) Active Problems:   Diabetes mellitus with complication (HCC)   Acute encephalopathy   Dementia (HCC)   AKI (acute kidney injury) ([place])   Essential hypertension   COPD (chronic obstructive pulmonary disease) (HCC)   CKD (chronic kidney disease) stage 3, GFR 30-59 ml/min   Acute lower UTI   UTI (urinary tract infection)    Discharge Instructions  Discharge Instructions    Diet - low sodium heart healthy   Complete by: As directed    Discharge instructions   Complete by: As directed    1)Please take your medications as instructed. 2)Follow up with your PCP in a week. Do a BMP test during the follow up.   Increase activity slowly   Complete by: As directed      Allergies as of 05/13/2019  Reactions   Aspirin Other (See Comments)   Triggers asthma      Medication List    STOP taking these medications   losartan 50 MG tablet Commonly known as: COZAAR   predniSONE 10 MG tablet Commonly known as:  DELTASONE     TAKE these medications   acetaminophen 325 MG tablet Commonly known as: TYLENOL Take 650 mg by mouth every 6 (six) hours as needed for headache (pain).   albuterol 108 (90 Base) MCG/ACT inhaler Commonly known as: VENTOLIN HFA Inhale 1-2 puffs into the lungs every 4 (four) hours as needed for wheezing or shortness of breath.   alendronate 70 MG tablet Commonly known as: FOSAMAX Take 70 mg by mouth once a week. Take 1 tablet by mouth only on Sundays   amLODipine 10 MG tablet Commonly known as: NORVASC Take 1 tablet (10 mg total) by mouth daily.   busPIRone 5 MG tablet Commonly known as: BUSPAR Take 5 mg by mouth 3 (three) times daily.   Calcium Citrate + D 315-250 MG-UNIT Tabs Generic drug: Calcium Citrate-Vitamin D Take 1 tablet by mouth daily.   docusate sodium 100 MG capsule Commonly known as: COLACE Take 1 capsule (100 mg total) by mouth 2 (two) times daily. What changed:   when to take this  reasons to take this   donepezil 10 MG tablet Commonly known as: ARICEPT Take 10 mg by mouth at bedtime.   escitalopram 10 MG tablet Commonly known as: LEXAPRO Take 10 mg by mouth at bedtime.   Flovent HFA 220 MCG/ACT inhaler Generic drug: fluticasone Inhale 1 puff into the lungs 2 (two) times daily.   gabapentin 100 MG capsule Commonly known as: NEURONTIN Take 1 capsule (100 mg total) by mouth 3 (three) times daily. What changed:   medication strength  how much to take   metFORMIN 500 MG 24 hr tablet Commonly known as: GLUCOPHAGE-XR Take 500 mg by mouth at bedtime.   montelukast 10 MG tablet Commonly known as: SINGULAIR Take 10 mg by mouth at bedtime.   Namenda XR 28 MG Cp24 24 hr capsule Generic drug: memantine Take 28 mg by mouth daily.      Follow-up Information    Leeroy Cha, MD. Schedule an appointment as soon as possible for a visit in 1 week(s).   Specialty: Internal Medicine Contact information: [address] [place] [address] [phone]          Allergies  Allergen Reactions  . Aspirin Other (See Comments)    Triggers asthma    Consultations:  None   Procedures/Studies: CT HEAD WO CONTRAST  Result Date: 05/10/2019 CLINICAL DATA:  Mental status change with lethargy and weakness for 2 days. Some reported response to Narcan. EXAM: CT HEAD WITHOUT CONTRAST TECHNIQUE: Contiguous axial images were obtained from the base of the skull through the vertex without intravenous contrast. COMPARISON:  CT head 01/28/2019 FINDINGS: Brain: There is no evidence of acute intracranial hemorrhage, mass lesion, brain edema or extra-axial fluid collection. Stable atrophy with prominence of the ventricles and subarachnoid spaces. 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 There is no CT evidence of acute cortical infarction. Vascular: Intracranial vascular calcifications. No hyperdense vessel identified. Skull: Negative for fracture or focal lesion. Sinuses/Orbits: Chronic sinusitis again noted with diffuse mucosal thickening throughout the frontal, ethmoid, sphenoid and maxillary sinuses, similar to previous study. There is diffuse nodularity of the nasal turbinates. Associated chronic osseous thinning without acute bone destruction. No significant  orbital findings. Other: None. IMPRESSION: 1. No acute intracranial findings. 2. 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 3. Chronic sinusitis. Electronically Signed   By: Richardean Sale M.D.   On: 05/10/2019 20:12   DG Chest Portable 1 View  Result Date: 05/10/2019 CLINICAL DATA:  Weakness. EXAM: PORTABLE CHEST 1 VIEW COMPARISON:  04/02/2019 FINDINGS: The lungs are clear without focal pneumonia, edema, pneumothorax or pleural effusion. Interstitial markings are diffusely coarsened with chronic features. The cardio pericardial silhouette is enlarged. Left permanent pacemaker again noted. Bones are diffusely demineralized. Telemetry  leads overlie the chest. IMPRESSION: Stable.  No acute findings. Electronically Signed   By: Misty Stanley M.D.   On: 05/10/2019 17:24      Subjective: Patient seen and examined at the bedside this morning.  Hemodynamically stable for discharge.  Discharge Exam: Vitals:   05/12/19 2135 05/13/19 0451  BP: (!) 172/82 (!) 171/74  Pulse: 63 62  Resp: 18 18  Temp: 98.2 F (36.8 C) 98.5 F (36.9 C)  SpO2: 98% 97%   Vitals:   05/12/19 0857 05/12/19 1729 05/12/19 2135 05/13/19 0451  BP: 131/67 (!) 164/88 (!) 172/82 (!) 171/74  Pulse: (!) 59 60 63 62  Resp: 18 18 18 18   Temp: 97.8 F (36.6 C) (!) 97.5 F (36.4 C) 98.2 F (36.8 C) 98.5 F (36.9 C)  TempSrc: Oral Oral Oral Oral  SpO2: 98% 96% 98% 97%  Weight:      Height:        General: Pt is alert, awake, not in acute distress,confused Cardiovascular: RRR, S1/S2 +, no rubs, no gallops Respiratory: CTA bilaterally, no wheezing, no rhonchi Abdominal: Soft, NT, ND, bowel sounds +,colostomy Extremities: no edema, no cyanosis    The results of significant diagnostics from this hospitalization (including imaging, microbiology, ancillary and laboratory) are listed below for reference.     Microbiology: Recent Results (from the past 240 hour(s))  SARS CORONAVIRUS 2 (TAT 6-24 HRS) Nasopharyngeal Nasopharyngeal Swab     Status: None   Collection Time: 05/10/19  8:35 PM   Specimen: Nasopharyngeal Swab  Result Value Ref Range Status   SARS Coronavirus 2 NEGATIVE NEGATIVE Final    Comment: (NOTE) SARS-CoV-2 target nucleic acids are NOT DETECTED. The SARS-CoV-2 RNA is generally detectable in upper and lower respiratory specimens during the acute phase of infection. Negative results do not preclude SARS-CoV-2 infection, do not rule out co-infections with other pathogens, and should not be used as the sole basis for treatment or other patient management decisions. Negative results must be combined with clinical  observations, patient history, and epidemiological information. The expected result is Negative. Fact Sheet for Patients: SugarRoll.be Fact Sheet for Healthcare Providers: https://www.woods-mathews.com/ This test is not yet approved or cleared by the [place] FDA and  has been authorized for detection and/or diagnosis of SARS-CoV-2 by FDA under an Emergency Use Authorization (EUA). This EUA will remain  in effect (meaning this test can be used) for the duration of the COVID-19 declaration under Section 56 4(b)(1) of the Act, 21 U.S.C. section 360bbb-3(b)(1), unless the authorization is terminated or revoked sooner. Performed at [facility], [place] [address], [place], [place] [address]   Culture, Urine     Status: Abnormal   Collection Time: 05/11/19  9:18 AM   Specimen: Urine, Random  Result Value Ref Range Status   Specimen Description URINE, RANDOM  Final   Special Requests   Final    NONE Performed at [facility]  Lab, [address], [place], [place] [address]    Culture 60,000 COLONIES/mL PSEUDOMONAS AERUGINOSA (A)  Final   Report Status 05/13/2019 FINAL  Final   Organism ID, Bacteria PSEUDOMONAS AERUGINOSA (A)  Final      Susceptibility   Pseudomonas aeruginosa - MIC*    CEFTAZIDIME 2 SENSITIVE Sensitive     CIPROFLOXACIN <=0.25 SENSITIVE Sensitive     GENTAMICIN <=1 SENSITIVE Sensitive     IMIPENEM 2 SENSITIVE Sensitive     PIP/TAZO 8 SENSITIVE Sensitive     CEFEPIME 2 SENSITIVE Sensitive     * 60,000 COLONIES/mL PSEUDOMONAS AERUGINOSA     Labs: BNP (last 3 results) No results for input(s): BNP in the last 8760 hours. Basic Metabolic Panel: Recent Labs  Lab 05/10/19 1701 05/11/19 0337 05/12/19 0547  NA 139 139 141  K 6.4* 5.8* 4.7  CL 106 109 111  CO2 20* 19* 23  GLUCOSE 148* 157* 115*  BUN 42* 41* 28*  CREATININE 2.98* 2.15* 1.45*  CALCIUM 9.3 8.5* 8.4*   Liver Function Tests: Recent Labs  Lab  05/10/19 1701  AST 16  ALT 10  ALKPHOS 79  BILITOT 0.5  PROT 6.9  ALBUMIN 3.4*   No results for input(s): LIPASE, AMYLASE in the last 168 hours. No results for input(s): AMMONIA in the last 168 hours. CBC: Recent Labs  Lab 05/10/19 1701 05/11/19 0653 05/12/19 0547  WBC 8.6 6.1 6.2  NEUTROABS 6.0  --   --   HGB 12.3 11.0* 10.5*  HCT 40.6 35.1* 32.7*  MCV 99.0 96.4 94.2  PLT 207 211 222   Cardiac Enzymes: No results for input(s): CKTOTAL, CKMB, CKMBINDEX, TROPONINI in the last 168 hours. BNP: Invalid input(s): POCBNP CBG: Recent Labs  Lab 05/12/19 1220 05/12/19 1611 05/12/19 2127 05/13/19 0658 05/13/19 1127  GLUCAP 115* 114* 297* 188* 124*   D-Dimer No results for input(s): DDIMER in the last 72 hours. Hgb A1c No results for input(s): HGBA1C in the last 72 hours. Lipid Profile No results for input(s): CHOL, HDL, LDLCALC, TRIG, CHOLHDL, LDLDIRECT in the last 72 hours. Thyroid function studies No results for input(s): TSH, T4TOTAL, T3FREE, THYROIDAB in the last 72 hours.  Invalid input(s): FREET3 Anemia work up No results for input(s): VITAMINB12, FOLATE, FERRITIN, TIBC, IRON, RETICCTPCT in the last 72 hours. Urinalysis    Component Value Date/Time   COLORURINE YELLOW 05/10/2019 1920   APPEARANCEUR CLOUDY (A) 05/10/2019 1920   LABSPEC 1.012 05/10/2019 1920   PHURINE 5.0 05/10/2019 1920   GLUCOSEU NEGATIVE 05/10/2019 1920   HGBUR NEGATIVE 05/10/2019 1920   BILIRUBINUR NEGATIVE 05/10/2019 1920   KETONESUR NEGATIVE 05/10/2019 1920   PROTEINUR NEGATIVE 05/10/2019 1920   UROBILINOGEN 1.0 01/09/2015 1620   NITRITE NEGATIVE 05/10/2019 1920   LEUKOCYTESUR LARGE (A) 05/10/2019 1920   Sepsis Labs Invalid input(s): PROCALCITONIN,  WBC,  LACTICIDVEN Microbiology Recent Results (from the past 240 hour(s))  SARS CORONAVIRUS 2 (TAT 6-24 HRS) Nasopharyngeal Nasopharyngeal Swab     Status: None   Collection Time: 05/10/19  8:35 PM   Specimen: Nasopharyngeal Swab   Result Value Ref Range Status   SARS Coronavirus 2 NEGATIVE NEGATIVE Final    Comment: (NOTE) SARS-CoV-2 target nucleic acids are NOT DETECTED. The SARS-CoV-2 RNA is generally detectable in upper and lower respiratory specimens during the acute phase of infection. Negative results do not preclude SARS-CoV-2 infection, do not rule out co-infections with other pathogens, and should not be used as the sole basis for treatment or other  patient management decisions. Negative results must be combined with clinical observations, patient history, and epidemiological information. The expected result is Negative. Fact Sheet for Patients: SugarRoll.be Fact Sheet for Healthcare Providers: https://www.woods-mathews.com/ This test is not yet approved or cleared by the [place] FDA and  has been authorized for detection and/or diagnosis of SARS-CoV-2 by FDA under an Emergency Use Authorization (EUA). This EUA will remain  in effect (meaning this test can be used) for the duration of the COVID-19 declaration under Section 56 4(b)(1) of the Act, 21 U.S.C. section 360bbb-3(b)(1), unless the authorization is terminated or revoked sooner. Performed at [facility], [place] [address], [place], [place] [address]   Culture, Urine     Status: Abnormal   Collection Time: 05/11/19  9:18 AM   Specimen: Urine, Random  Result Value Ref Range Status   Specimen Description URINE, RANDOM  Final   Special Requests   Final    NONE Performed at [facility], [place] [address] [place], [place] [address]    Culture 60,000 COLONIES/mL PSEUDOMONAS AERUGINOSA (A)  Final   Report Status 05/13/2019 FINAL  Final   Organism ID, Bacteria PSEUDOMONAS AERUGINOSA (A)  Final      Susceptibility   Pseudomonas aeruginosa - MIC*    CEFTAZIDIME 2 SENSITIVE Sensitive     CIPROFLOXACIN <=0.25 SENSITIVE Sensitive     GENTAMICIN <=1 SENSITIVE Sensitive     IMIPENEM 2 SENSITIVE  Sensitive     PIP/TAZO 8 SENSITIVE Sensitive     CEFEPIME 2 SENSITIVE Sensitive     * 60,000 COLONIES/mL PSEUDOMONAS AERUGINOSA    Please note: You were cared for by a hospitalist during your hospital stay. Once you are discharged, your primary care physician will handle any further medical issues. Please note that NO REFILLS for any discharge medications will be authorized once you are discharged, as it is imperative that you return to your primary care physician (or establish a relationship with a primary care physician if you do not have one) for your post hospital discharge needs so that they can reassess your need for medications and monitor your lab values.    Time coordinating discharge: 40 minutes  SIGNED:   Shelly Coss, MD  [facility] 05/13/2019, 1:48 PM Pager [phone]  If 7PM-7AM, please contact night-coverage [email or web address] Password [email or web address]

## 2019-05-15 ENCOUNTER — Other Ambulatory Visit: Payer: Self-pay | Admitting: *Deleted

## 2019-05-15 ENCOUNTER — Encounter: Payer: Self-pay | Admitting: *Deleted

## 2019-05-15 NOTE — Patient Outreach (Signed)
[facility]) Care Management  05/15/2019  [name] 02/29/1934 [id number]   RN Health Coach discipline closure. Patient has been admitted to hospital.   Plan: Discipline closure Discipline closure letter sent to PCP  [facility] [phone]

## 2019-05-19 ENCOUNTER — Ambulatory Visit: Payer: Self-pay | Admitting: *Deleted

## 2019-05-19 NOTE — Telephone Encounter (Signed)
This encounter was created in error - please disregard.

## 2019-06-08 ENCOUNTER — Encounter (HOSPITAL_COMMUNITY): Payer: Self-pay

## 2019-06-08 ENCOUNTER — Other Ambulatory Visit: Payer: Self-pay

## 2019-06-08 ENCOUNTER — Emergency Department (HOSPITAL_COMMUNITY)
Admission: EM | Admit: 2019-06-08 | Discharge: 2019-06-09 | Disposition: A | Discharge status: Home / Self Care | Payer: Medicare Other | Attending: Emergency Medicine | Admitting: Emergency Medicine

## 2019-06-08 DIAGNOSIS — E1122 Type 2 diabetes mellitus with diabetic chronic kidney disease: Secondary | ICD-10-CM | POA: Diagnosis not present

## 2019-06-08 DIAGNOSIS — N183 Chronic kidney disease, stage 3 unspecified: Secondary | ICD-10-CM | POA: Diagnosis not present

## 2019-06-08 DIAGNOSIS — Z79899 Other long term (current) drug therapy: Secondary | ICD-10-CM | POA: Insufficient documentation

## 2019-06-08 DIAGNOSIS — N390 Urinary tract infection, site not specified: Secondary | ICD-10-CM | POA: Insufficient documentation

## 2019-06-08 DIAGNOSIS — R627 Adult failure to thrive: Secondary | ICD-10-CM | POA: Diagnosis present

## 2019-06-08 DIAGNOSIS — J45909 Unspecified asthma, uncomplicated: Secondary | ICD-10-CM | POA: Insufficient documentation

## 2019-06-08 DIAGNOSIS — Z950 Presence of cardiac pacemaker: Secondary | ICD-10-CM | POA: Diagnosis not present

## 2019-06-08 DIAGNOSIS — Z87891 Personal history of nicotine dependence: Secondary | ICD-10-CM | POA: Diagnosis not present

## 2019-06-08 DIAGNOSIS — J449 Chronic obstructive pulmonary disease, unspecified: Secondary | ICD-10-CM | POA: Insufficient documentation

## 2019-06-08 DIAGNOSIS — I129 Hypertensive chronic kidney disease with stage 1 through stage 4 chronic kidney disease, or unspecified chronic kidney disease: Secondary | ICD-10-CM | POA: Insufficient documentation

## 2019-06-08 DIAGNOSIS — E860 Dehydration: Secondary | ICD-10-CM | POA: Insufficient documentation

## 2019-06-08 DIAGNOSIS — Z7984 Long term (current) use of oral hypoglycemic drugs: Secondary | ICD-10-CM | POA: Insufficient documentation

## 2019-06-08 DIAGNOSIS — F0390 Unspecified dementia without behavioral disturbance: Secondary | ICD-10-CM | POA: Diagnosis not present

## 2019-06-08 LAB — URINALYSIS, ROUTINE W REFLEX MICROSCOPIC
Bilirubin Urine: NEGATIVE
Glucose, UA: NEGATIVE mg/dL
Hgb urine dipstick: NEGATIVE
Ketones, ur: 20 mg/dL — AB
Leukocytes,Ua: NEGATIVE
Nitrite: NEGATIVE
Protein, ur: NEGATIVE mg/dL
Specific Gravity, Urine: 1.016 (ref 1.005–1.030)
pH: 5 (ref 5.0–8.0)

## 2019-06-08 LAB — CBC WITH DIFFERENTIAL/PLATELET
Abs Immature Granulocytes: 0.01 10*3/uL (ref 0.00–0.07)
Basophils Absolute: 0.1 10*3/uL (ref 0.0–0.1)
Basophils Relative: 1 %
Eosinophils Absolute: 0.4 10*3/uL (ref 0.0–0.5)
Eosinophils Relative: 6 %
HCT: 39.9 % (ref 36.0–46.0)
Hemoglobin: 12.4 g/dL (ref 12.0–15.0)
Immature Granulocytes: 0 %
Lymphocytes Relative: 37 %
Lymphs Abs: 2.1 10*3/uL (ref 0.7–4.0)
MCH: 30.7 pg (ref 26.0–34.0)
MCHC: 31.1 g/dL (ref 30.0–36.0)
MCV: 98.8 fL (ref 80.0–100.0)
Monocytes Absolute: 0.5 10*3/uL (ref 0.1–1.0)
Monocytes Relative: 8 %
Neutro Abs: 2.8 10*3/uL (ref 1.7–7.7)
Neutrophils Relative %: 48 %
Platelets: 226 10*3/uL (ref 150–400)
RBC: 4.04 MIL/uL (ref 3.87–5.11)
RDW: 12.6 % (ref 11.5–15.5)
WBC: 5.8 10*3/uL (ref 4.0–10.5)
nRBC: 0 % (ref 0.0–0.2)

## 2019-06-08 LAB — COMPREHENSIVE METABOLIC PANEL
ALT: 10 U/L (ref 0–44)
AST: 16 U/L (ref 15–41)
Albumin: 3.8 g/dL (ref 3.5–5.0)
Alkaline Phosphatase: 65 U/L (ref 38–126)
Anion gap: 11 (ref 5–15)
BUN: 46 mg/dL — ABNORMAL HIGH (ref 8–23)
CO2: 24 mmol/L (ref 22–32)
Calcium: 10 mg/dL (ref 8.9–10.3)
Chloride: 108 mmol/L (ref 98–111)
Creatinine, Ser: 2.02 mg/dL — ABNORMAL HIGH (ref 0.44–1.00)
GFR calc Af Amer: 26 mL/min — ABNORMAL LOW (ref >60)
GFR calc non Af Amer: 22 mL/min — ABNORMAL LOW (ref >60)
Glucose, Bld: 85 mg/dL (ref 70–99)
Potassium: 4.5 mmol/L (ref 3.5–5.1)
Sodium: 143 mmol/L (ref 135–145)
Total Bilirubin: 1.1 mg/dL (ref 0.3–1.2)
Total Protein: 7.3 g/dL (ref 6.5–8.1)

## 2019-06-08 MED ORDER — SULFAMETHOXAZOLE-TRIMETHOPRIM 800-160 MG PO TABS: 1.0000 | ORAL_TABLET | Freq: Two times a day (BID) | ORAL | 0 refills | Status: AC

## 2019-06-08 MED ORDER — SODIUM CHLORIDE 0.9 % IV BOLUS
1000.0000 mL | Freq: Once | INTRAVENOUS | Status: AC
  Administered 2019-06-08: 1000 mL via INTRAVENOUS

## 2019-06-08 MED ORDER — SODIUM CHLORIDE 0.9 % IV SOLN
1.0000 g | Freq: Once | INTRAVENOUS | Status: AC
  Administered 2019-06-08: 1 g via INTRAVENOUS
  Filled 2019-06-08: qty 10

## 2019-06-08 NOTE — ED Notes (Addendum)
Patient given saltine crackers, apple sauce, and grape juice. Will reassess for PO challenge

## 2019-06-08 NOTE — Discharge Instructions (Addendum)
Drink plenty of fluids and follow-up with your family doctor next week. °

## 2019-06-08 NOTE — ED Triage Notes (Signed)
Per EMS, Pt is coming from home. Family states the pt has not been eating or urinating for the last two days. Pt also has been more lethargic for the last week. Recent dx of kidney failure, and dementia (A&O 2x).

## 2019-06-08 NOTE — ED Notes (Signed)
Pt has no complaints at this time  

## 2019-06-08 NOTE — ED Notes (Signed)
Attempted IV x 2 with no success. 

## 2019-06-08 NOTE — ED Provider Notes (Signed)
[facility] DEPT Provider Note   CSN: [id number] Arrival date & time: 06/08/19  1739     History Chief Complaint  Patient presents with  . Failure To Thrive    [name] is a 86 y.o. female.  Patient was sent over here because she has been eating or drinking.  Although when you talk to the patient she says that she wants to go ahead and eat drink something.  She has dementia  The history is provided by the patient and medical records. No language interpreter was used.  Weakness Severity:  Mild Onset quality:  Sudden Timing:  Intermittent Progression:  Worsening Chronicity:  New Context: not alcohol use   Relieved by:  Nothing Worsened by:  Nothing Ineffective treatments:  None tried Associated symptoms: no abdominal pain, no chest pain, no cough, no diarrhea, no frequency, no headaches and no seizures   Risk factors: no anemia        Past Medical History:  Diagnosis Date  . Asthma   . CKD (chronic kidney disease) stage 3, GFR 30-59 ml/min 05/01/2018  . Colostomy present ([place])    hx/notes 07/17/2010  . Dementia ([place])   . Diabetic neuropathy ([place])   . Hypertension    hx/notes 07/17/2010  . Pneumonia    recent/notes 10/08/2016  . Symptomatic bradycardia 12/15/2017  . Type II diabetes mellitus ([place])    hx/notes 07/17/2010    Patient Active Problem List   Diagnosis Date Noted  . UTI (urinary tract infection) 05/11/2019  . ARF (acute renal failure) ([place]) 05/10/2019  . Acute lower UTI 05/10/2019  . Symptomatic bradycardia 05/01/2018  . Near syncope 05/01/2018  . CKD (chronic kidney disease) stage 3, GFR 30-59 ml/min 05/01/2018  . Influenza B 04/10/2018  . Generalized weakness 04/10/2018  . Essential hypertension 03/01/2018  . COPD (chronic obstructive pulmonary disease) ([place]) 03/01/2018  . Bradycardia 12/15/2017  . AKI (acute kidney injury) ([place]) 07/18/2017  . Colostomy present ([place]) 11/08/2016  . Syncope 10/08/2016  . Acute  kidney injury ([place]) 10/08/2016  . Abnormal chest x-ray 10/08/2016  . Lethargy 10/08/2016  . Dementia ([place])   . Sepsis ([place]) 09/05/2016  . Community acquired pneumonia 09/05/2016  . Acute encephalopathy 09/05/2016  . Diabetes mellitus with complication ([place]) 40/98/1191  . Nausea & vomiting 04/09/2012  . Gallstones 03/15/2011  . Partial SBO 03/15/2011  . Colostomy hernia ([place]) 03/15/2011  . PSEUDOGOUT 02/28/2007  . IRON DEFIC ANEMIA [place] DIET IRON INTAKE 02/28/2007  . DISCITIS 02/28/2007  . OSTEOMYELITIS 02/28/2007  . CHILLS WITHOUT FEVER 02/28/2007    Past Surgical History:  Procedure Laterality Date  . ABDOMINAL HYSTERECTOMY    . COLOSTOMY     S/P SBO hx/notes 07/17/2010  . LUMBAR FUSION  11/2006   hx/notes 07/17/2010  . NASAL SINUS SURGERY  04/28/2002   Bilateral inferior turbinate reductions, bilateral maxillary antrotomies, bilateral total ethmoidectomies, bilateral frontal recess explorations, bilateral sphenoidotomies, Instatrac guidance./notes 07/28/2010  . PACEMAKER IMPLANT N/A 05/02/2018   Procedure: PACEMAKER IMPLANT;  Surgeon: Evans Lance, MD;  Location: [facility];  Service: Cardiovascular;  Laterality: N/A;     OB History   No obstetric history on file.     Family History  Problem Relation Age of Onset  . Kidney disease Mother   . Hypertension Mother   . Other Father        gangrene with amputation  . Diabetes Sister   . Cancer Brother   . Heart disease Brother   .  Schizophrenia Daughter   . Diabetes Daughter   . Stroke Daughter   . Diabetes Sister   . Breast cancer Neg Hx     Social History   Tobacco Use  . Smoking status: Never Smoker  . Smokeless tobacco: Former Systems developer    Types: Chew  Substance Use Topics  . Alcohol use: No  . Drug use: No    Home Medications Prior to Admission medications   Medication Sig Start Date End Date Taking? Authorizing Provider  acetaminophen (TYLENOL) 325 MG tablet Take 650 mg by mouth every 6 (six) hours  as needed for headache (pain).   Yes [provider]  albuterol (PROVENTIL HFA;VENTOLIN HFA) 108 (90 Base) MCG/ACT inhaler Inhale 1-2 puffs into the lungs every 4 (four) hours as needed for wheezing or shortness of breath.    Yes [provider]  alendronate (FOSAMAX) 70 MG tablet Take 70 mg by mouth once a week. Take 1 tablet by mouth only on Sundays   Yes [provider]  amLODipine (NORVASC) 10 MG tablet Take 1 tablet (10 mg total) by mouth daily. 05/13/19 05/12/20 Yes Adhikari, Tamsen Meek, MD  busPIRone (BUSPAR) 5 MG tablet Take 5 mg by mouth 3 (three) times daily. 11/24/17  Yes [provider]  Calcium Citrate-Vitamin D (CALCIUM CITRATE + D) 315-250 MG-UNIT TABS Take 1 tablet by mouth daily.   Yes [provider]  donepezil (ARICEPT) 10 MG tablet Take 10 mg by mouth at bedtime.  02/24/15  Yes [provider]  escitalopram (LEXAPRO) 10 MG tablet Take 10 mg by mouth at bedtime. 06/16/17  Yes [provider]  FLOVENT HFA 220 MCG/ACT inhaler Inhale 1 puff into the lungs 2 (two) times daily. Patient taking differently: Inhale 1 puff into the lungs 2 (two) times daily as needed (sob/wheezing).  05/31/18  Yes Mannam, Praveen, MD  memantine (NAMENDA XR) 28 MG CP24 24 hr capsule Take 28 mg by mouth daily.   Yes [provider]  metFORMIN (GLUCOPHAGE-XR) 500 MG 24 hr tablet Take 500 mg by mouth at bedtime. 03/24/19  Yes [provider]  montelukast (SINGULAIR) 10 MG tablet Take 10 mg by mouth at bedtime.  06/21/17  Yes [provider]  docusate sodium (COLACE) 100 MG capsule Take 1 capsule (100 mg total) by mouth 2 (two) times daily. Patient not taking: Reported on 06/08/2019 05/03/18   Lavina Hamman, MD  gabapentin (NEURONTIN) 100 MG capsule Take 1 capsule (100 mg total) by mouth 3 (three) times daily. Patient not taking: Reported on 06/08/2019 05/13/19   Shelly Coss, MD  sulfamethoxazole-trimethoprim (BACTRIM DS) 800-160 MG  tablet Take 1 tablet by mouth 2 (two) times daily for 7 days. 06/08/19 06/15/19  Milton Ferguson, MD    Allergies    Aspirin  Review of Systems   Review of Systems  Constitutional: Negative for appetite change and fatigue.  HENT: Negative for congestion, ear discharge and sinus pressure.   Eyes: Negative for discharge.  Respiratory: Negative for cough.   Cardiovascular: Negative for chest pain.  Gastrointestinal: Negative for abdominal pain and diarrhea.  Genitourinary: Negative for frequency and hematuria.  Musculoskeletal: Negative for back pain.  Skin: Negative for rash.  Neurological: Positive for weakness. Negative for seizures and headaches.  Psychiatric/Behavioral: Negative for hallucinations.    Physical Exam Updated Vital Signs BP 136/70   Pulse 62   Temp 98.6 F (37 C)   Resp 18   SpO2 95%   Physical Exam Vitals and nursing note  reviewed.  Constitutional:      Appearance: She is well-developed.  HENT:     Head: Normocephalic.     Nose: Nose normal.  Eyes:     General: No scleral icterus.    Conjunctiva/sclera: Conjunctivae normal.  Neck:     Thyroid: No thyromegaly.  Cardiovascular:     Rate and Rhythm: Normal rate and regular rhythm.     Heart sounds: No murmur. No friction rub. No gallop.   Pulmonary:     Breath sounds: No stridor. No wheezing or rales.  Chest:     Chest wall: No tenderness.  Abdominal:     General: There is no distension.     Tenderness: There is no abdominal tenderness. There is no rebound.  Musculoskeletal:        General: Normal range of motion.     Cervical back: Neck supple.  Lymphadenopathy:     Cervical: No cervical adenopathy.  Skin:    Findings: No erythema or rash.  Neurological:     Mental Status: She is alert.     Motor: No abnormal muscle tone.     Coordination: Coordination normal.     Comments: Patient oriented to person and place only  Psychiatric:        Behavior: Behavior normal.     ED Results /  Procedures / Treatments   Labs (all labs ordered are listed, but only abnormal results are displayed) Labs Reviewed  COMPREHENSIVE METABOLIC PANEL - Abnormal; Notable for the following components:      Result Value   BUN 46 (*)    Creatinine, Ser 2.02 (*)    GFR calc non Af Amer 22 (*)    GFR calc Af Amer 26 (*)    All other components within normal limits  URINALYSIS, ROUTINE W REFLEX MICROSCOPIC - Abnormal; Notable for the following components:   Ketones, ur 20 (*)    All other components within normal limits  URINE CULTURE  CBC WITH DIFFERENTIAL/PLATELET    EKG None  Radiology No results found.  Procedures Procedures (including critical care time)  Medications Ordered in ED Medications  cefTRIAXone (ROCEPHIN) 1 g in sodium chloride 0.9 % 100 mL IVPB (has no administration in time range)  sodium chloride 0.9 % bolus 1,000 mL (0 mLs Intravenous Stopped 06/08/19 2026)  sodium chloride 0.9 % bolus 1,000 mL (1,000 mLs Intravenous New Bag/Given 06/08/19 2045)    ED Course  I have reviewed the triage vital signs and the nursing notes.  Pertinent labs & imaging results that were available during my care of the patient were reviewed by me and considered in my medical decision making (see chart for details).    MDM Rules/Calculators/A&P                      Patient with mild dehydration and a urinary tract infection.  Patient ate and drank well here.  Patient was given Rocephin IV and a prescription for Bactrim.  She will follow-up with her doctor Final Clinical Impression(s) / ED Diagnoses Final diagnoses:  Dehydration  Lower urinary tract infectious disease    Rx / DC Orders ED Discharge Orders         Ordered    sulfamethoxazole-trimethoprim (BACTRIM DS) 800-160 MG tablet  2 times daily     06/08/19 2233           Milton Ferguson, MD 06/08/19 2248

## 2019-06-08 NOTE — ED Notes (Signed)
[facility] called  

## 2019-06-09 NOTE — ED Notes (Signed)
Pt continues to wait on PTAR, should be next on the list. Pt resting. Denies needs at this time.

## 2019-06-09 NOTE — ED Notes (Signed)
PTAR has arrived to pick up patient. Pt woke up to verbal stimuli without difficulty and was able to verify her information. She is answering questions appropriately and joking with the [facility] staff. Denies any needs before discharge. AVS sent with paperwork. Granddaughter at house to accept patient.

## 2019-06-09 NOTE — ED Notes (Signed)
Patient ate all of the applesauce, 2 crackers, and 4 oz of grape juice

## 2019-06-10 LAB — URINE CULTURE: Culture: NO GROWTH

## 2019-07-29 IMAGING — DX DG CHEST 2V
2 series · 2 of 2 positions shown · non-contrast
Comparison: 04/09/2018

CLINICAL DATA: Syncope.  Bradycardia.

EXAM:
CHEST - 2 VIEW

[chest lat]
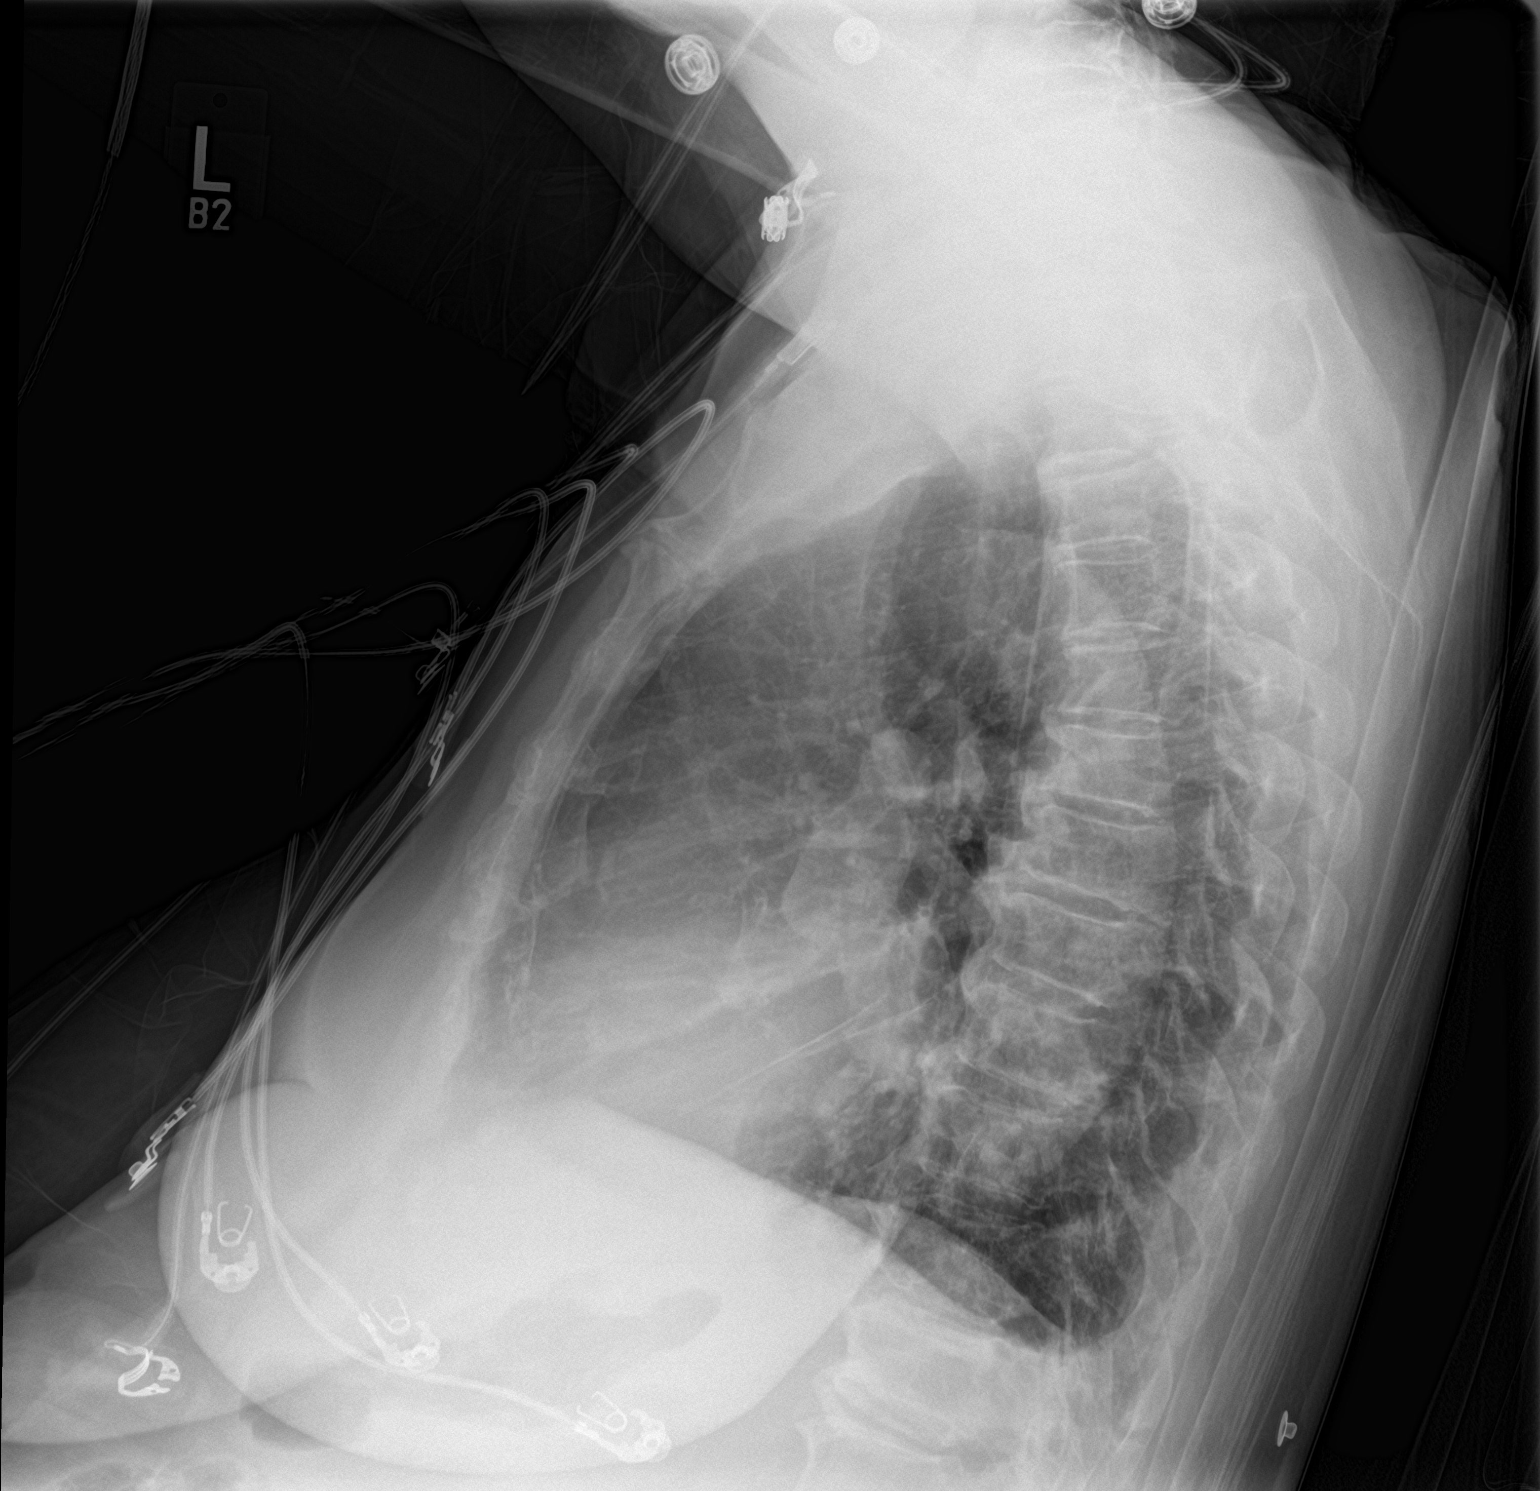

[chest ap]
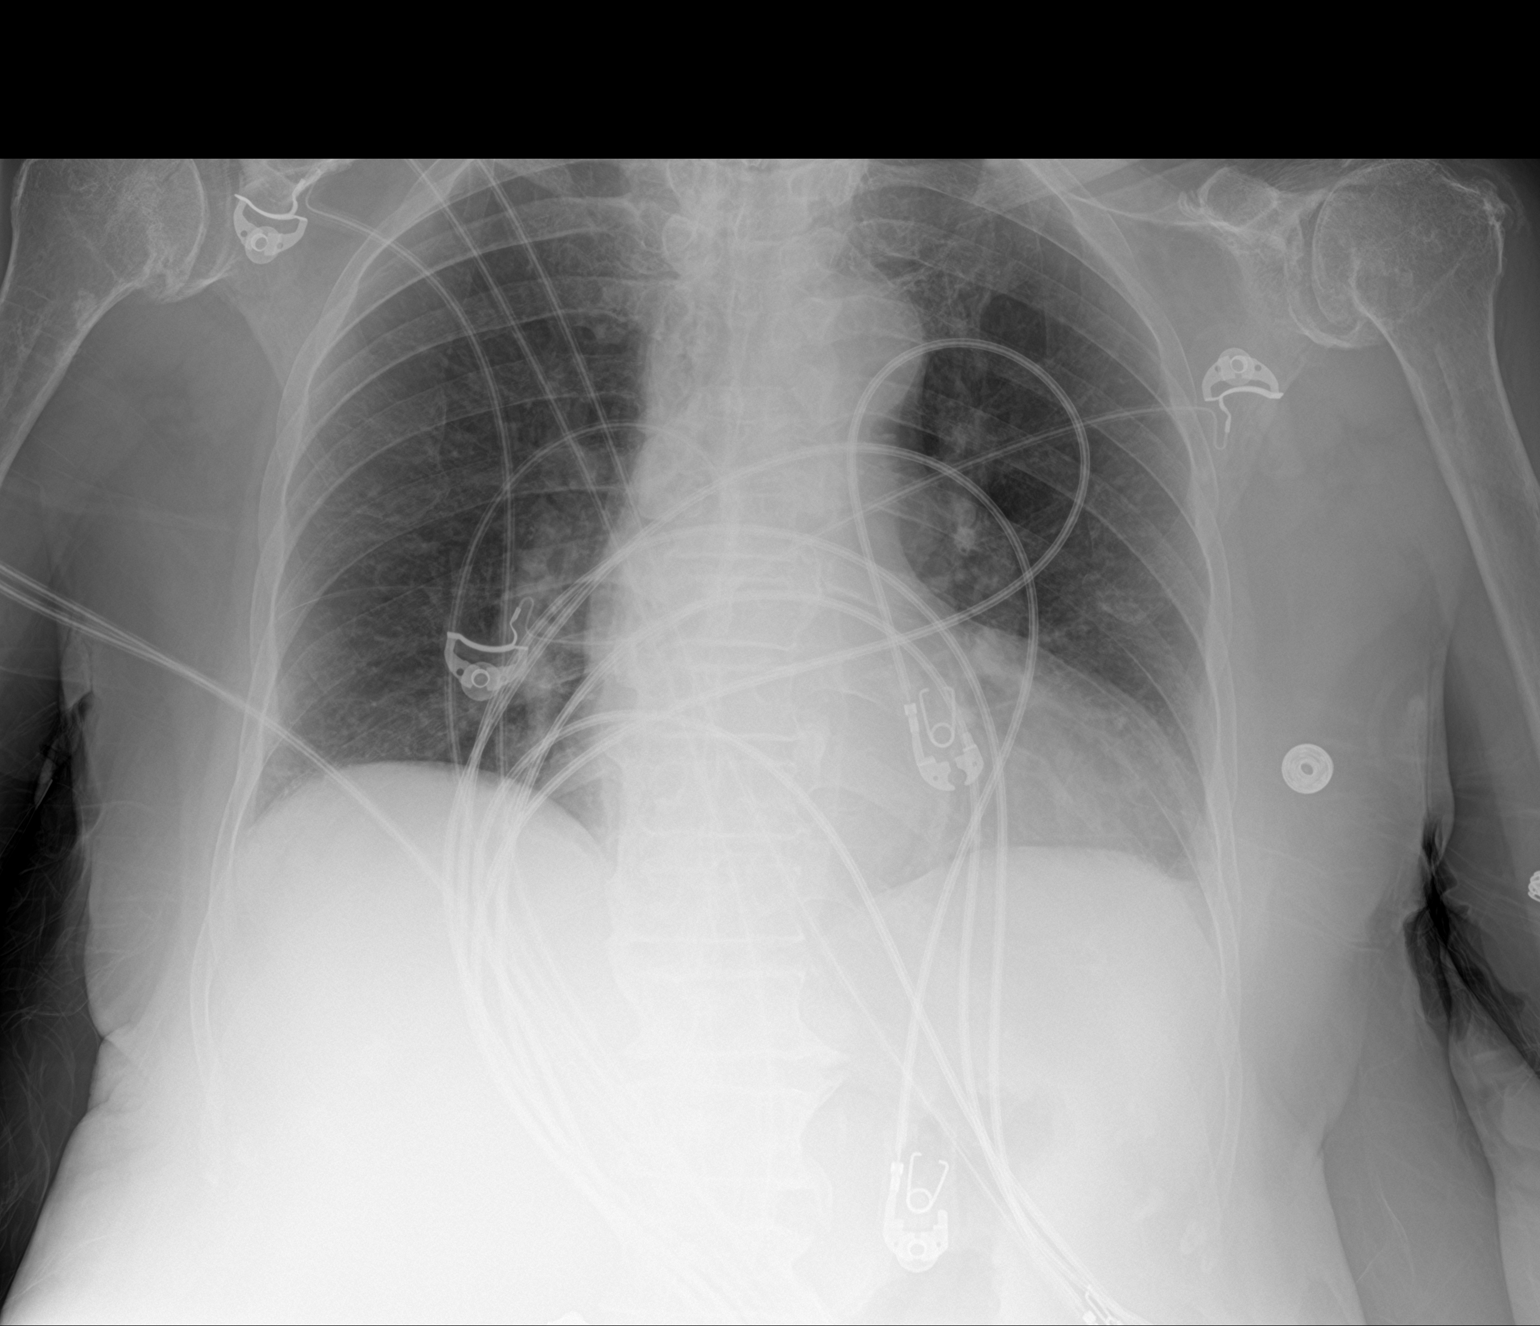

[2 of 2 positions shown; findings below may reference images not displayed]

FINDINGS: The cardiac silhouette remains mildly enlarged. No airspace
consolidation, edema, pleural effusion, pneumothorax is identified.
Degenerative changes are noted at the shoulders.
IMPRESSION: No active cardiopulmonary disease.

## 2019-08-03 IMAGING — DX DG CHEST 1V PORT
1 series · 1 of 1 positions shown · non-contrast
Comparison: 05/03/2018

CLINICAL DATA: Syncope today.

EXAM:
PORTABLE CHEST 1 VIEW

[chest]
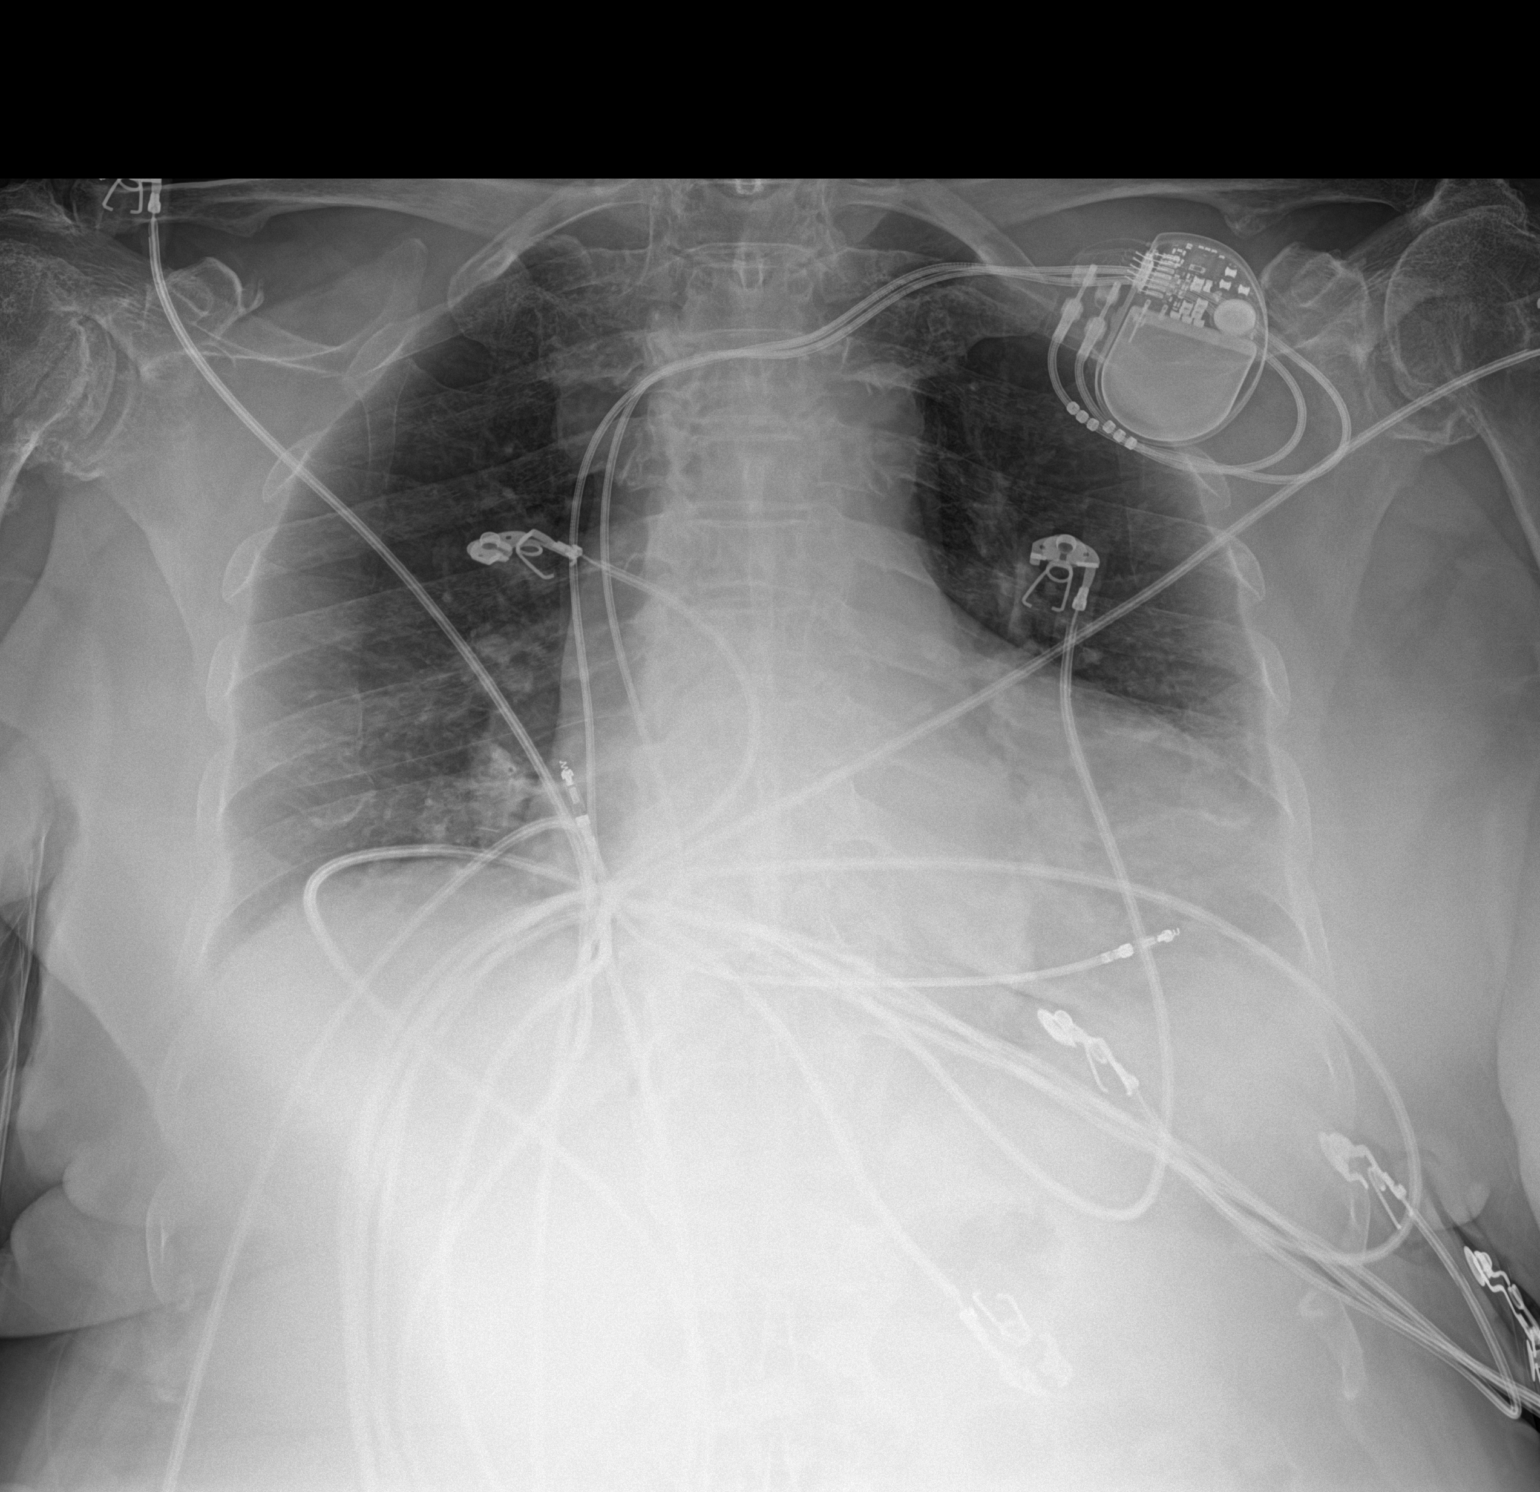

[1 of 1 positions shown; findings below may reference images not displayed]

FINDINGS: The heart size and pulmonary vascularity are normal. Pacemaker in
place. Lungs are clear.

No acute bone abnormality. Chronic degenerative changes of both
shoulders, right greater than left.
IMPRESSION: No acute abnormalities.

## 2019-08-04 ENCOUNTER — Telehealth: Payer: Self-pay

## 2019-08-04 NOTE — Telephone Encounter (Signed)
Unable to speak  with patient to remind of missed remote transmission 

## 2019-08-18 ENCOUNTER — Telehealth: Payer: Medicare Other | Admitting: Cardiology

## 2019-11-15 ENCOUNTER — Telehealth: Payer: Self-pay | Admitting: *Deleted

## 2019-11-15 NOTE — Telephone Encounter (Signed)
LMOM for pt's daughter, Daphine (DPR), requesting call back to DC. Direct number and office hours provided. Pt is overdue for f/u with Dr. Lovena Le and her Merlin monitor is disconnected.

## 2019-11-17 NOTE — Telephone Encounter (Signed)
Patient advised she is overdue for follow-up with Dr. Lovena Le. Advised her I send her information to the scheduler and she will call to make apt.   Patients home monitor box was unplugged. Attempted family to send transmission. Remote monitor box blinking yellow light and making a long beeping noise. Advised patient they will need to call [facility] to assist with tech support for remote box. Phone number provided.

## 2019-11-21 NOTE — Telephone Encounter (Signed)
No answer/ voicemail full °

## 2019-12-04 ENCOUNTER — Encounter: Payer: Medicare Other | Admitting: Internal Medicine

## 2019-12-04 NOTE — Telephone Encounter (Signed)
Pt is scheduled for f/u with A. Tillery, PA-C, on 12/29/19.

## 2019-12-20 ENCOUNTER — Encounter: Payer: Self-pay | Admitting: Cardiology

## 2019-12-20 ENCOUNTER — Telehealth (INDEPENDENT_AMBULATORY_CARE_PROVIDER_SITE_OTHER): Payer: Medicare Other | Admitting: Cardiology

## 2019-12-20 VITALS — Ht 63.0 in | Wt 150.0 lb

## 2019-12-20 DIAGNOSIS — F0150 Vascular dementia without behavioral disturbance: Secondary | ICD-10-CM

## 2019-12-20 DIAGNOSIS — Z950 Presence of cardiac pacemaker: Secondary | ICD-10-CM

## 2019-12-20 DIAGNOSIS — N1832 Chronic kidney disease, stage 3b: Secondary | ICD-10-CM

## 2019-12-20 DIAGNOSIS — R001 Bradycardia, unspecified: Secondary | ICD-10-CM

## 2019-12-20 NOTE — Progress Notes (Signed)
Virtual Visit via Telephone Note   This visit type was conducted due to national recommendations for restrictions regarding the COVID-19 Pandemic (e.g. social distancing) in an effort to limit this patient's exposure and mitigate transmission in our community.  Due to her co-morbid illnesses, this patient is at least at moderate risk for complications without adequate follow up.  This format is felt to be most appropriate for this patient at this time.  The patient did not have access to video technology/had technical difficulties with video requiring transitioning to audio format only (telephone).  All issues noted in this document were discussed and addressed.  No physical exam could be performed with this format.  Please refer to the patient's chart for her  consent to telehealth for [facility].  Evaluation Performed:  Follow-up visit  This visit type was conducted due to national recommendations for restrictions regarding the COVID-19 Pandemic (e.g. social distancing).  This format is felt to be most appropriate for this patient at this time.  All issues noted in this document were discussed and addressed.  No physical exam was performed (except for noted visual exam findings with Video Visits).  Please refer to the patient's chart (MyChart message for video visits and phone note for telephone visits) for the patient's consent to telehealth for [facility]  Date:  12/22/2019   ID:  [name], DOB Apr 07, 1933, MRN [id number]  Patient Location:  [place] [address]   Provider location:     [address] Office [phone] Fax [phone]   PCP:  Leeroy Cha, MD  Cardiologist:  Skeet Latch, MD  Electrophysiologist:  Virl Axe, MD   Chief Complaint: Follow-up for bradycardia, hypertension, and syncope  History of Present Illness:    [name] is a 86 y.o. female  who presents via audio/video conferencing for a telehealth visit today.  Patient verified DOB and address.  She has a past medical history of dementia, hypertension, diabetes, colostomy (small bowel obstruction/rupture), bradycardia, and syncope.  She was admitted 05/03/2018 after a syncopal episode.  She received PPM implant 05/02/2018.  Several attempts have been made to connect with Ms. Twichell however, she has been lost to follow-up.   She was most recently seen in the hospital on 06/08/2019.  She was sent to the emergency department because she had not been eating or drinking at home.  However, when she presented to the emergency department she indicated that she wanted to go ahead eat and drink.  She was noted to have some generalized weakness.  She was oriented to person and place only.  She was diagnosed with dehydration and UTI.  She was prescribed Bactrim and discharged in stable condition.  Last remote device check was 11/02/2018.  Histograms were appropriate, leads and battery were stable.  No changes were recommended at that time.  She is connected with today virtually in follow-up and states she feels fairly well.  She states that she has not been very active.  She receives her meals from [employer].  She has not had any recent episodes of dizziness, weakness, or chest pain.  She states that she feels she probably does not drink enough fluids.  Her granddaughter indicates that her urine has a strong odor.  However, patient denies urine frequency, and fever.  Her granddaughter also notices that she has had a cough.  Patient states that she is breathing well and has  not had any production with her cough, chills, or fever.  She feels that her cough is getting better.  She was instructed to contact her PCP if her cough became worse or if she developed symptoms of UTI.  She was also informed of her visit with EP on 12/29/2019 at 1300.  She expressed understanding.  I have asked her to increase her  physical activity as tolerated, given her the salty 6 diet sheet, asked her to increase her fluid intake, and will have her follow-up with Dr. Oval Linsey or me in 6 months.  Today she denies chest pain, shortness of breath, lower extremity edema, fatigue, palpitations, melena, hematuria, hemoptysis, diaphoresis, weakness, presyncope, syncope, orthopnea, and PND.   The patient does not symptoms concerning for COVID-19 infection (fever, chills, cough, or new SHORTNESS OF BREATH).    Prior CV studies:   The following studies were reviewed today:  Echocardiogram 12/17/2017 Study Conclusions   - Left ventricle: The cavity size was normal. Systolic function was  vigorous. The estimated ejection fraction was in the range of 65%  to 70%. Wall motion was normal; there were no regional wall  motion abnormalities. Features are consistent with a pseudonormal  left ventricular filling pattern, with concomitant abnormal  relaxation and increased filling pressure (grade 2 diastolic  dysfunction). Doppler parameters are consistent with high  ventricular filling pressure.  - Ascending aorta: The ascending aorta was mildly dilated.  - Mitral valve: There was mild regurgitation.  - Tricuspid valve: There was mild regurgitation.  - Pulmonic valve: There was mild regurgitation.  - Pulmonary arteries: PA peak pressure: 42 mm Hg (S).   Impressions:   - The right ventricular systolic pressure was increased consistent  with moderate pulmonary hypertension.  Past Medical History:  Diagnosis Date  . Asthma   . CKD (chronic kidney disease) stage 3, GFR 30-59 ml/min ([place]) 05/01/2018  . Colostomy present ([place])    hx/notes 07/17/2010  . Dementia ([place])   . Diabetic neuropathy ([place])   . Hypertension    hx/notes 07/17/2010  . Pneumonia    recent/notes 10/08/2016  . Symptomatic bradycardia 12/15/2017  . Type II diabetes mellitus ([place])    hx/notes 07/17/2010   Past Surgical History:  Procedure  Laterality Date  . ABDOMINAL HYSTERECTOMY    . COLOSTOMY     S/P SBO hx/notes 07/17/2010  . LUMBAR FUSION  11/2006   hx/notes 07/17/2010  . NASAL SINUS SURGERY  04/28/2002   Bilateral inferior turbinate reductions, bilateral maxillary antrotomies, bilateral total ethmoidectomies, bilateral frontal recess explorations, bilateral sphenoidotomies, Instatrac guidance./notes 07/28/2010  . PACEMAKER IMPLANT N/A 05/02/2018   Procedure: PACEMAKER IMPLANT;  Surgeon: Evans Lance, MD;  Location: [facility];  Service: Cardiovascular;  Laterality: N/A;     Current Meds  Medication Sig  . acetaminophen (TYLENOL) 325 MG tablet Take 650 mg by mouth every 6 (six) hours as needed for headache (pain).  Marland Kitchen albuterol (PROVENTIL HFA;VENTOLIN HFA) 108 (90 Base) MCG/ACT inhaler Inhale 1-2 puffs into the lungs every 4 (four) hours as needed for wheezing or shortness of breath.   Marland Kitchen alendronate (FOSAMAX) 70 MG tablet Take 70 mg by mouth once a week. Take 1 tablet by mouth only on Sundays  . amLODipine (NORVASC) 10 MG tablet Take 1 tablet (10 mg total) by mouth daily.  . busPIRone (BUSPAR) 5 MG tablet Take 5 mg by mouth 3 (three) times daily.  . Calcium Citrate-Vitamin D (CALCIUM CITRATE + D) 315-250 MG-UNIT TABS Take 1 tablet  by mouth daily.  Marland Kitchen donepezil (ARICEPT) 10 MG tablet Take 10 mg by mouth at bedtime.   Marland Kitchen escitalopram (LEXAPRO) 10 MG tablet Take 10 mg by mouth at bedtime.  Marland Kitchen FLOVENT HFA 220 MCG/ACT inhaler Inhale 1 puff into the lungs 2 (two) times daily. (Patient taking differently: Inhale 1 puff into the lungs 2 (two) times daily as needed (sob/wheezing). )  . memantine (NAMENDA XR) 28 MG CP24 24 hr capsule Take 28 mg by mouth daily.  . metFORMIN (GLUCOPHAGE-XR) 500 MG 24 hr tablet Take 500 mg by mouth at bedtime.  . montelukast (SINGULAIR) 10 MG tablet Take 10 mg by mouth at bedtime.   . pravastatin (PRAVACHOL) 10 MG tablet Take 10 mg by mouth at bedtime.     Allergies:   Aspirin   Social History     Tobacco Use  . Smoking status: Never Smoker  . Smokeless tobacco: Former Systems developer    Types: Secondary school teacher  . Vaping Use: Never used  Substance Use Topics  . Alcohol use: No  . Drug use: No     Family Hx: The patient's family history includes Cancer in her brother; Diabetes in her daughter, sister, and sister; Heart disease in her brother; Hypertension in her mother; Kidney disease in her mother; Other in her father; Schizophrenia in her daughter; Stroke in her daughter. There is no history of Breast cancer.  ROS:   Please see the history of present illness.     All other systems reviewed and are negative.   Labs/Other Tests and Data Reviewed:    Recent Labs: 06/08/2019: ALT 10; BUN 46; Creatinine, Ser 2.02; Hemoglobin 12.4; Platelets 226; Potassium 4.5; Sodium 143   Recent Lipid Panel No results found for: CHOL, TRIG, HDL, CHOLHDL, LDLCALC, LDLDIRECT  Wt Readings from Last 3 Encounters:  12/20/19 150 lb (68 kg)  05/11/19 150 lb 5.7 oz (68.2 kg)  04/02/19 160 lb (72.6 kg)     Exam:    Vital Signs:  There were no vitals taken for this visit.   Well nourished, well developed female in no  acute distress.   ASSESSMENT & PLAN:    1.  Bradycardia-heart rate today unable to obtain.  Had PPM placed 05/02/2018.  No complaints of activity intolerance or increased DOE. Increase physical activity as tolerated Heart healthy low-sodium diet Follow-up with EP  Syncope-no further syncopal events.  Noted to have symptomatic bradycardia and underwent PPM 05/02/2018. Continue p.o. hydration Lower extremity support stockings Increase physical activity as tolerated  Essential hypertension-BP today unable to obtain.  Well-controlled at home Continue amlodipine, Heart healthy low-sodium diet-salty 6 given Increase physical activity as tolerated  Hyperlipidemia-on statin therapy Heart healthy high-fiber low-sodium diet Increase physical activity as tolerated Continue  pravastatin Followed by PCP  Dementia-appears to be mild.  She is pleasant and able to answer questions appropriately.  She is able to ask informed questions.   Continue Aricept, Namenda, Lexapro Increase physical activity as tolerated Followed by PCP  Disposition: Follow-up with Dr. Oval Linsey in 6 months.  COVID-19 Education: The signs and symptoms of COVID-19 were discussed with the patient and how to seek care for testing (follow up with PCP or arrange E-visit).  The importance of social distancing was discussed today.  Patient Risk:   After full review of this patients clinical status, I feel that they are at least moderate risk at this time.  Time:   Today, I have spent 9 minutes with the patient with telehealth  technology discussing medication, heart rate, blood pressure, diet, and exercise.  I spent greater than 20 minutes reviewing her past medical history, hospital admissions, and previous cardiac testing.   Medication Adjustments/Labs and Tests Ordered: Current medicines are reviewed at length with the patient today.  Concerns regarding medicines are outlined above.   Tests Ordered: No orders of the defined types were placed in this encounter.  Medication Changes: No orders of the defined types were placed in this encounter.   Disposition:  in 6 month(s)  Signed, Jossie Ng. Jaianna Nicoll NP-C    10/18/2018 11:58 AM    [address] Office [phone] Fax [phone]

## 2019-12-21 DIAGNOSIS — Z950 Presence of cardiac pacemaker: Secondary | ICD-10-CM | POA: Insufficient documentation

## 2019-12-21 NOTE — Progress Notes (Signed)
This patient has an appointment next week in the [facility] clinic.  If she has been doing well she doesn't need this appointment 10/06 as well.  Kerin Ransom PA-C 12/21/2019 10:25 AM

## 2019-12-22 ENCOUNTER — Encounter: Payer: Self-pay | Admitting: General Practice

## 2019-12-22 ENCOUNTER — Telehealth (INDEPENDENT_AMBULATORY_CARE_PROVIDER_SITE_OTHER): Payer: Medicare Other | Admitting: General Practice

## 2019-12-22 DIAGNOSIS — R55 Syncope and collapse: Secondary | ICD-10-CM

## 2019-12-22 DIAGNOSIS — R001 Bradycardia, unspecified: Secondary | ICD-10-CM | POA: Diagnosis not present

## 2019-12-22 DIAGNOSIS — E781 Pure hyperglyceridemia: Secondary | ICD-10-CM | POA: Diagnosis not present

## 2019-12-22 DIAGNOSIS — F0150 Vascular dementia without behavioral disturbance: Secondary | ICD-10-CM

## 2019-12-22 DIAGNOSIS — I10 Essential (primary) hypertension: Secondary | ICD-10-CM

## 2019-12-22 NOTE — Patient Instructions (Signed)
Medication Instructions:  The current medical regimen is effective;  continue present plan and medications as directed. Please refer to the Current Medication list given to you today.  *If you need a refill on your cardiac medications before your next appointment, please call your pharmacy*  Lab Work:   Testing/Procedures:  NONE    NONE  Special Instructions INCREASE FLUID INTAKE   PLEASE READ AND FOLLOW SALTY 6-ATTACHED  PLEASE INCREASE PHYSICAL ACTIVITY AS TOLERATED  MAKE SURE TO CALL YOUR PCP IF YOUR COUGH BECOMES PRODUCTIVE OR YOU THINK YOU HAVE A URINARY INFECTION  Follow-Up: Your next appointment:  6 month(s) In Person with Skeet Latch, MD -[place], FNP-C Please call our office 2 months in advance to schedule this appointment   [place], you and your health needs are our priority.  As part of our continuing mission to provide you with exceptional heart care, we have created designated Provider Care Teams.  These Care Teams include your primary Cardiologist (physician) and Advanced Practice Providers (APPs -  Physician Assistants and Nurse Practitioners) who all work together to provide you with the care you need, when you need it.  We recommend signing up for the patient portal called "MyChart".  Sign up information is provided on this After Visit Summary.  MyChart is used to connect with patients for Virtual Visits (Telemedicine).  Patients are able to view lab/test results, encounter notes, upcoming appointments, etc.  Non-urgent messages can be sent to your provider as well.   To learn more about what you can do with MyChart, go to [email or web address].

## 2019-12-29 ENCOUNTER — Other Ambulatory Visit: Payer: Self-pay

## 2019-12-29 ENCOUNTER — Ambulatory Visit (INDEPENDENT_AMBULATORY_CARE_PROVIDER_SITE_OTHER): Payer: Medicare Other | Admitting: Student

## 2019-12-29 ENCOUNTER — Encounter: Payer: Self-pay | Admitting: Student

## 2019-12-29 VITALS — BP 120/58 | HR 72 | Ht 63.0 in | Wt 129.0 lb

## 2019-12-29 DIAGNOSIS — R55 Syncope and collapse: Secondary | ICD-10-CM

## 2019-12-29 DIAGNOSIS — R001 Bradycardia, unspecified: Secondary | ICD-10-CM | POA: Diagnosis not present

## 2019-12-29 DIAGNOSIS — Z950 Presence of cardiac pacemaker: Secondary | ICD-10-CM

## 2019-12-29 LAB — CUP PACEART INCLINIC DEVICE CHECK
Battery Remaining Longevity: 129 mo
Battery Voltage: 3.01 V
Brady Statistic RA Percent Paced: 74 %
Brady Statistic RV Percent Paced: 0 %
Date Time Interrogation Session: 20211015132914
Implantable Lead Implant Date: 20200217
Implantable Lead Implant Date: 20200217
Implantable Lead Location: 753859
Implantable Lead Location: 753860
Implantable Pulse Generator Implant Date: 20200217
Lead Channel Impedance Value: 475 Ohm
Lead Channel Impedance Value: 612.5 Ohm
Lead Channel Pacing Threshold Amplitude: 0.5 V
Lead Channel Pacing Threshold Amplitude: 0.5 V
Lead Channel Pacing Threshold Amplitude: 0.5 V
Lead Channel Pacing Threshold Amplitude: 0.5 V
Lead Channel Pacing Threshold Pulse Width: 0.5 ms
Lead Channel Pacing Threshold Pulse Width: 0.5 ms
Lead Channel Pacing Threshold Pulse Width: 0.5 ms
Lead Channel Pacing Threshold Pulse Width: 0.5 ms
Lead Channel Sensing Intrinsic Amplitude: 1.1 mV
Lead Channel Sensing Intrinsic Amplitude: 12 mV
Lead Channel Setting Pacing Amplitude: 2 V
Lead Channel Setting Pacing Amplitude: 2.5 V
Lead Channel Setting Pacing Pulse Width: 0.5 ms
Lead Channel Setting Sensing Sensitivity: 2 mV
Pulse Gen Model: 2272
Pulse Gen Serial Number: 9107620

## 2019-12-29 NOTE — Progress Notes (Signed)
Electrophysiology Office Note Date: 12/29/2019  ID:  [name], DOB 04-23-1933, MRN [id number]  PCP: Sandy Cha, MD Primary Cardiologist: Sandy Latch, MD Electrophysiologist: Sandy Peru, MD   CC: Pacemaker follow-up  [name] is a 86 y.o. female seen today for Sandy Peru, MD for routine electrophysiology followup.  Since last being seen in our clinic the patient reports doing well. She has had no further syncope.  she denies chest pain, palpitations, dyspnea, PND, orthopnea, nausea, vomiting, dizziness, syncope, edema, weight gain, or early satiety.  Device History: St. Jude Dual Chamber PPM implanted 04/2018 for symptomatic bradycardia/syncope  Past Medical History:  Diagnosis Date  . Asthma   . CKD (chronic kidney disease) stage 3, GFR 30-59 ml/min ([place]) 05/01/2018  . Colostomy present ([place])    hx/notes 07/17/2010  . Dementia ([place])   . Diabetic neuropathy ([place])   . Hypertension    hx/notes 07/17/2010  . Pneumonia    recent/notes 10/08/2016  . Symptomatic bradycardia 12/15/2017  . Type II diabetes mellitus ([place])    hx/notes 07/17/2010   Past Surgical History:  Procedure Laterality Date  . ABDOMINAL HYSTERECTOMY    . COLOSTOMY     S/P SBO hx/notes 07/17/2010  . LUMBAR FUSION  11/2006   hx/notes 07/17/2010  . NASAL SINUS SURGERY  04/28/2002   Bilateral inferior turbinate reductions, bilateral maxillary antrotomies, bilateral total ethmoidectomies, bilateral frontal recess explorations, bilateral sphenoidotomies, Instatrac guidance./notes 07/28/2010  . PACEMAKER IMPLANT N/A 05/02/2018   Procedure: PACEMAKER IMPLANT;  Surgeon: Sandy Lance, MD;  Location: [facility];  Service: Cardiovascular;  Laterality: N/A;    Current Outpatient Medications  Medication Sig Dispense Refill  . acetaminophen (TYLENOL) 325 MG tablet Take 650 mg by mouth every 6 (six) hours as needed for headache (pain).    Marland Kitchen albuterol (PROVENTIL HFA;VENTOLIN HFA) 108 (90  Base) MCG/ACT inhaler Inhale 1-2 puffs into the lungs every 4 (four) hours as needed for wheezing or shortness of breath.     Marland Kitchen alendronate (FOSAMAX) 70 MG tablet Take 70 mg by mouth once a week. Take 1 tablet by mouth only on Sundays    . amLODipine (NORVASC) 10 MG tablet Take 1 tablet (10 mg total) by mouth daily. 30 tablet 1  . busPIRone (BUSPAR) 5 MG tablet Take 5 mg by mouth 3 (three) times daily.  0  . Calcium Citrate-Vitamin D (CALCIUM CITRATE + D) 315-250 MG-UNIT TABS Take 1 tablet by mouth daily.    Marland Kitchen donepezil (ARICEPT) 10 MG tablet Take 10 mg by mouth at bedtime.   1  . escitalopram (LEXAPRO) 10 MG tablet Take 10 mg by mouth at bedtime.  5  . FLOVENT HFA 220 MCG/ACT inhaler Inhale 1 puff into the lungs 2 (two) times daily. (Patient taking differently: Inhale 1 puff into the lungs 2 (two) times daily as needed (sob/wheezing). ) 1 Inhaler 2  . memantine (NAMENDA XR) 28 MG CP24 24 hr capsule Take 28 mg by mouth daily.    . metFORMIN (GLUCOPHAGE-XR) 500 MG 24 hr tablet Take 500 mg by mouth at bedtime.    . montelukast (SINGULAIR) 10 MG tablet Take 10 mg by mouth at bedtime.   5  . pravastatin (PRAVACHOL) 10 MG tablet Take 10 mg by mouth at bedtime.     No current facility-administered medications for this visit.    Allergies:   Aspirin   Social History: Social History   Socioeconomic History  . Marital status: Single  Spouse name: Not on file  . Number of children: Not on file  . Years of education: Not on file  . Highest education level: Not on file  Occupational History  . Not on file  Tobacco Use  . Smoking status: Never Smoker  . Smokeless tobacco: Former Systems developer    Types: Secondary school teacher  . Vaping Use: Never used  Substance and Sexual Activity  . Alcohol use: No  . Drug use: No  . Sexual activity: Never  Other Topics Concern  . Not on file  Social History Narrative  . Not on file   Social Determinants of Health   Financial Resource Strain:   . Difficulty of  Paying Living Expenses: Not on file  Food Insecurity:   . Worried About Charity fundraiser in the Last Year: Not on file  . Ran Out of Food in the Last Year: Not on file  Transportation Needs:   . Lack of Transportation (Medical): Not on file  . Lack of Transportation (Non-Medical): Not on file  Physical Activity:   . Days of Exercise per Week: Not on file  . Minutes of Exercise per Session: Not on file  Stress:   . Feeling of Stress : Not on file  Social Connections:   . Frequency of Communication with Friends and Family: Not on file  . Frequency of Social Gatherings with Friends and Family: Not on file  . Attends Religious Services: Not on file  . Active Member of Clubs or Organizations: Not on file  . Attends Archivist Meetings: Not on file  . Marital Status: Not on file  Intimate Partner Violence:   . Fear of Current or Ex-Partner: Not on file  . Emotionally Abused: Not on file  . Physically Abused: Not on file  . Sexually Abused: Not on file    Family History: Family History  Problem Relation Age of Onset  . Kidney disease Mother   . Hypertension Mother   . Other Father        gangrene with amputation  . Diabetes Sister   . Cancer Brother   . Heart disease Brother   . Schizophrenia Daughter   . Diabetes Daughter   . Stroke Daughter   . Diabetes Sister   . Breast cancer Neg Hx      Review of Systems: All other systems reviewed and are otherwise negative except as noted above.  Physical Exam: Vitals:   12/29/19 1254  BP: (!) 120/58  Pulse: 72  SpO2: 96%  Weight: 129 lb (58.5 kg)  Height: 5\' 3"  (1.6 m)     GEN- The patient is well appearing, alert and oriented x 3 today.   HEENT: normocephalic, atraumatic; sclera clear, conjunctiva pink; hearing intact; oropharynx clear; neck supple  Lungs- Clear to ausculation bilaterally, normal work of breathing.  No wheezes, rales, rhonchi Heart- Regular rate and rhythm, no murmurs, rubs or gallops  GI-  soft, non-tender, non-distended, bowel sounds present  Extremities- no clubbing or cyanosis. No edema MS- no significant deformity or atrophy Skin- warm and dry, no rash or lesion; PPM pocket well healed Psych- euthymic mood, full affect Neuro- strength and sensation are intact  PPM Interrogation- reviewed in detail today,  See PACEART report  EKG:  EKG is not ordered today.  Recent Labs: 06/08/2019: ALT 10; BUN 46; Creatinine, Ser 2.02; Hemoglobin 12.4; Platelets 226; Potassium 4.5; Sodium 143   Wt Readings from Last 3 Encounters:  12/29/19 129 lb (  58.5 kg)  12/20/19 150 lb (68 kg)  05/11/19 150 lb 5.7 oz (68.2 kg)     Other studies Reviewed: Additional studies/ records that were reviewed today include: Previous EP office notes, Previous remote checks, Most recent labwork.   Assessment and Plan:  1. Symptomatic bradycardia s/p St. Jude PPM  Normal PPM function See Claudia Desanctis Art report Device programmed to chronic settings as this was her first in office visit since implantation. Battery life greatly improved.  Remotes have not been function. Intent had been to set up with a monitor in office today, but none available. Will arrange via [facility].   Current medicines are reviewed at length with the patient today.   The patient does not have concerns regarding her medicines.  The following changes were made today:  none  Labs/ tests ordered today include:  No orders of the defined types were placed in this encounter.  Disposition:   Follow up with Dr. Lovena Le in 12 Months   Signed, Annamaria Helling  12/29/2019 1:24 PM  [address] [place] [address] [phone] (office) [phone] (fax)

## 2019-12-29 NOTE — Patient Instructions (Signed)
Medication Instructions:  Your physician recommends that you continue on your current medications as directed. Please refer to the Current Medication list given to you today.  Labwork: None ordered.  Testing/Procedures: None ordered.  Follow-Up: Your physician wants you to follow-up in: one year with Dr. Lovena Le.   You will receive a reminder letter in the mail two months in advance. If you don't receive a letter, please call our office to schedule the follow-up appointment.  Remote monitoring is used to monitor your Pacemaker from home.   Device clinic [phone]  Any Other Special Instructions Will Be Listed Below (If Applicable).  If you need a refill on your cardiac medications before your next appointment, please call your pharmacy.

## 2020-04-06 ENCOUNTER — Encounter (HOSPITAL_COMMUNITY): Payer: Self-pay

## 2020-04-06 ENCOUNTER — Inpatient Hospital Stay (HOSPITAL_COMMUNITY)
Admission: EM | Admit: 2020-04-06 | Discharge: 2020-04-11 | DRG: 682 | Disposition: A | Discharge status: Skilled Nursing Facility | Payer: Medicare Other | Attending: Internal Medicine | Admitting: Internal Medicine

## 2020-04-06 ENCOUNTER — Other Ambulatory Visit: Payer: Self-pay

## 2020-04-06 DIAGNOSIS — G9341 Metabolic encephalopathy: Secondary | ICD-10-CM | POA: Diagnosis present

## 2020-04-06 DIAGNOSIS — Z933 Colostomy status: Secondary | ICD-10-CM | POA: Diagnosis not present

## 2020-04-06 DIAGNOSIS — I5032 Chronic diastolic (congestive) heart failure: Secondary | ICD-10-CM | POA: Diagnosis present

## 2020-04-06 DIAGNOSIS — E860 Dehydration: Secondary | ICD-10-CM | POA: Diagnosis not present

## 2020-04-06 DIAGNOSIS — R059 Cough, unspecified: Secondary | ICD-10-CM

## 2020-04-06 DIAGNOSIS — Z818 Family history of other mental and behavioral disorders: Secondary | ICD-10-CM

## 2020-04-06 DIAGNOSIS — E1122 Type 2 diabetes mellitus with diabetic chronic kidney disease: Secondary | ICD-10-CM | POA: Diagnosis present

## 2020-04-06 DIAGNOSIS — G9340 Encephalopathy, unspecified: Secondary | ICD-10-CM | POA: Diagnosis present

## 2020-04-06 DIAGNOSIS — Z841 Family history of disorders of kidney and ureter: Secondary | ICD-10-CM | POA: Diagnosis not present

## 2020-04-06 DIAGNOSIS — N1832 Chronic kidney disease, stage 3b: Secondary | ICD-10-CM | POA: Diagnosis not present

## 2020-04-06 DIAGNOSIS — E1140 Type 2 diabetes mellitus with diabetic neuropathy, unspecified: Secondary | ICD-10-CM | POA: Diagnosis present

## 2020-04-06 DIAGNOSIS — R001 Bradycardia, unspecified: Secondary | ICD-10-CM | POA: Diagnosis not present

## 2020-04-06 DIAGNOSIS — E785 Hyperlipidemia, unspecified: Secondary | ICD-10-CM | POA: Diagnosis present

## 2020-04-06 DIAGNOSIS — N179 Acute kidney failure, unspecified: Secondary | ICD-10-CM | POA: Diagnosis not present

## 2020-04-06 DIAGNOSIS — Z833 Family history of diabetes mellitus: Secondary | ICD-10-CM | POA: Diagnosis not present

## 2020-04-06 DIAGNOSIS — Z809 Family history of malignant neoplasm, unspecified: Secondary | ICD-10-CM | POA: Diagnosis not present

## 2020-04-06 DIAGNOSIS — F0150 Vascular dementia without behavioral disturbance: Secondary | ICD-10-CM | POA: Diagnosis not present

## 2020-04-06 DIAGNOSIS — E118 Type 2 diabetes mellitus with unspecified complications: Secondary | ICD-10-CM

## 2020-04-06 DIAGNOSIS — N183 Chronic kidney disease, stage 3 unspecified: Secondary | ICD-10-CM | POA: Diagnosis not present

## 2020-04-06 DIAGNOSIS — F0390 Unspecified dementia without behavioral disturbance: Secondary | ICD-10-CM | POA: Diagnosis present

## 2020-04-06 DIAGNOSIS — J438 Other emphysema: Secondary | ICD-10-CM | POA: Diagnosis not present

## 2020-04-06 DIAGNOSIS — Z7984 Long term (current) use of oral hypoglycemic drugs: Secondary | ICD-10-CM

## 2020-04-06 DIAGNOSIS — Z7983 Long term (current) use of bisphosphonates: Secondary | ICD-10-CM | POA: Diagnosis not present

## 2020-04-06 DIAGNOSIS — Z79899 Other long term (current) drug therapy: Secondary | ICD-10-CM

## 2020-04-06 DIAGNOSIS — Z8249 Family history of ischemic heart disease and other diseases of the circulatory system: Secondary | ICD-10-CM | POA: Diagnosis not present

## 2020-04-06 DIAGNOSIS — R5381 Other malaise: Secondary | ICD-10-CM | POA: Diagnosis not present

## 2020-04-06 DIAGNOSIS — Z823 Family history of stroke: Secondary | ICD-10-CM

## 2020-04-06 DIAGNOSIS — J449 Chronic obstructive pulmonary disease, unspecified: Secondary | ICD-10-CM | POA: Diagnosis not present

## 2020-04-06 DIAGNOSIS — R339 Retention of urine, unspecified: Secondary | ICD-10-CM

## 2020-04-06 DIAGNOSIS — Z950 Presence of cardiac pacemaker: Secondary | ICD-10-CM | POA: Diagnosis not present

## 2020-04-06 DIAGNOSIS — U071 COVID-19: Secondary | ICD-10-CM | POA: Diagnosis not present

## 2020-04-06 DIAGNOSIS — R2681 Unsteadiness on feet: Secondary | ICD-10-CM | POA: Diagnosis not present

## 2020-04-06 DIAGNOSIS — E119 Type 2 diabetes mellitus without complications: Secondary | ICD-10-CM | POA: Diagnosis present

## 2020-04-06 DIAGNOSIS — J9811 Atelectasis: Secondary | ICD-10-CM | POA: Diagnosis not present

## 2020-04-06 DIAGNOSIS — R531 Weakness: Secondary | ICD-10-CM | POA: Diagnosis not present

## 2020-04-06 DIAGNOSIS — I130 Hypertensive heart and chronic kidney disease with heart failure and stage 1 through stage 4 chronic kidney disease, or unspecified chronic kidney disease: Secondary | ICD-10-CM | POA: Diagnosis not present

## 2020-04-06 DIAGNOSIS — Z7189 Other specified counseling: Secondary | ICD-10-CM | POA: Diagnosis not present

## 2020-04-06 DIAGNOSIS — R279 Unspecified lack of coordination: Secondary | ICD-10-CM | POA: Diagnosis not present

## 2020-04-06 DIAGNOSIS — E872 Acidosis: Secondary | ICD-10-CM | POA: Diagnosis not present

## 2020-04-06 DIAGNOSIS — Z433 Encounter for attention to colostomy: Secondary | ICD-10-CM | POA: Diagnosis not present

## 2020-04-06 DIAGNOSIS — J069 Acute upper respiratory infection, unspecified: Secondary | ICD-10-CM | POA: Diagnosis not present

## 2020-04-06 DIAGNOSIS — Z886 Allergy status to analgesic agent status: Secondary | ICD-10-CM

## 2020-04-06 DIAGNOSIS — Z743 Need for continuous supervision: Secondary | ICD-10-CM | POA: Diagnosis not present

## 2020-04-06 DIAGNOSIS — Z515 Encounter for palliative care: Secondary | ICD-10-CM | POA: Diagnosis not present

## 2020-04-06 DIAGNOSIS — M6281 Muscle weakness (generalized): Secondary | ICD-10-CM | POA: Diagnosis not present

## 2020-04-06 DIAGNOSIS — R1312 Dysphagia, oropharyngeal phase: Secondary | ICD-10-CM | POA: Diagnosis not present

## 2020-04-06 LAB — BASIC METABOLIC PANEL WITH GFR
Anion gap: 16 — ABNORMAL HIGH (ref 5–15)
BUN: 54 mg/dL — ABNORMAL HIGH (ref 8–23)
CO2: 18 mmol/L — ABNORMAL LOW (ref 22–32)
Calcium: 9.8 mg/dL (ref 8.9–10.3)
Chloride: 107 mmol/L (ref 98–111)
Creatinine, Ser: 2.47 mg/dL — ABNORMAL HIGH (ref 0.44–1.00)
GFR, Estimated: 19 mL/min — ABNORMAL LOW (ref >60)
Glucose, Bld: 71 mg/dL (ref 70–99)
Potassium: 5 mmol/L (ref 3.5–5.1)
Sodium: 141 mmol/L (ref 135–145)

## 2020-04-06 LAB — URINALYSIS, ROUTINE W REFLEX MICROSCOPIC
Bilirubin Urine: NEGATIVE
Glucose, UA: NEGATIVE mg/dL
Hgb urine dipstick: NEGATIVE
Ketones, ur: 20 mg/dL — AB
Leukocytes,Ua: NEGATIVE
Nitrite: NEGATIVE
Protein, ur: NEGATIVE mg/dL
Specific Gravity, Urine: 1.015 (ref 1.005–1.030)
pH: 5 (ref 5.0–8.0)

## 2020-04-06 LAB — CBC WITH DIFFERENTIAL/PLATELET
Abs Immature Granulocytes: 0.02 10*3/uL (ref 0.00–0.07)
Basophils Absolute: 0 10*3/uL (ref 0.0–0.1)
Basophils Relative: 0 %
Eosinophils Absolute: 0.1 10*3/uL (ref 0.0–0.5)
Eosinophils Relative: 3 %
HCT: 37.1 % (ref 36.0–46.0)
Hemoglobin: 11.4 g/dL — ABNORMAL LOW (ref 12.0–15.0)
Immature Granulocytes: 1 %
Lymphocytes Relative: 30 %
Lymphs Abs: 1.3 10*3/uL (ref 0.7–4.0)
MCH: 30.7 pg (ref 26.0–34.0)
MCHC: 30.7 g/dL (ref 30.0–36.0)
MCV: 100 fL (ref 80.0–100.0)
Monocytes Absolute: 0.6 10*3/uL (ref 0.1–1.0)
Monocytes Relative: 14 %
Neutro Abs: 2.2 10*3/uL (ref 1.7–7.7)
Neutrophils Relative %: 52 %
Platelets: 153 10*3/uL (ref 150–400)
RBC: 3.71 MIL/uL — ABNORMAL LOW (ref 3.87–5.11)
RDW: 12.4 % (ref 11.5–15.5)
WBC: 4.2 10*3/uL (ref 4.0–10.5)
nRBC: 0 % (ref 0.0–0.2)

## 2020-04-06 MED ORDER — SODIUM CHLORIDE 0.9 % IV BOLUS
500.0000 mL | Freq: Once | INTRAVENOUS | Status: AC
  Administered 2020-04-06: 500 mL via INTRAVENOUS

## 2020-04-06 MED ORDER — SODIUM CHLORIDE 0.9 % IV BOLUS: 1000.0000 mL | Freq: Once | INTRAVENOUS | Status: DC

## 2020-04-06 NOTE — ED Provider Notes (Signed)
[facility] DEPT Provider Note   CSN: [id number] Arrival date & time: 04/06/20  1455     History No chief complaint on file.   [name] is a 87 y.o. female.  Patient with history of chronic kidney disease, dementia, dehydration, HFpEF (grade 2, Echo 2019) --presents from home by EMS. Per EMS report is that patient has had decreased urinary output.  Level 5 caveat due to dementia.  Patient has no complaints currently.        Past Medical History:  Diagnosis Date  . Asthma   . CKD (chronic kidney disease) stage 3, GFR 30-59 ml/min ([place]) 05/01/2018  . Colostomy present ([place])    hx/notes 07/17/2010  . Dementia ([place])   . Diabetic neuropathy ([place])   . Hypertension    hx/notes 07/17/2010  . Pneumonia    recent/notes 10/08/2016  . Symptomatic bradycardia 12/15/2017  . Type II diabetes mellitus ([place])    hx/notes 07/17/2010    Patient Active Problem List   Diagnosis Date Noted  . Pacemaker 12/21/2019  . UTI (urinary tract infection) 05/11/2019  . ARF (acute renal failure) ([place]) 05/10/2019  . Acute lower UTI 05/10/2019  . Symptomatic bradycardia 05/01/2018  . Near syncope 05/01/2018  . CKD (chronic kidney disease) stage 3, GFR 30-59 ml/min 05/01/2018  . Influenza B 04/10/2018  . Generalized weakness 04/10/2018  . Essential hypertension 03/01/2018  . COPD (chronic obstructive pulmonary disease) ([place]) 03/01/2018  . Bradycardia 12/15/2017  . AKI (acute kidney injury) ([place]) 07/18/2017  . Colostomy present ([place]) 11/08/2016  . Syncope 10/08/2016  . Acute kidney injury ([place]) 10/08/2016  . Abnormal chest x-ray 10/08/2016  . Lethargy 10/08/2016  . Dementia ([place])   . Sepsis ([place]) 09/05/2016  . Community acquired pneumonia 09/05/2016  . Acute encephalopathy 09/05/2016  . Diabetes mellitus with complication ([place]) [id number]  . Nausea & vomiting 04/09/2012  . Gallstones 03/15/2011  . Partial SBO 03/15/2011  . Colostomy hernia ([place]) 03/15/2011  .  PSEUDOGOUT 02/28/2007  . IRON DEFIC ANEMIA [place] DIET IRON INTAKE 02/28/2007  . DISCITIS 02/28/2007  . OSTEOMYELITIS 02/28/2007  . CHILLS WITHOUT FEVER 02/28/2007    Past Surgical History:  Procedure Laterality Date  . ABDOMINAL HYSTERECTOMY    . COLOSTOMY     S/P SBO hx/notes 07/17/2010  . LUMBAR FUSION  11/2006   hx/notes 07/17/2010  . NASAL SINUS SURGERY  04/28/2002   Bilateral inferior turbinate reductions, bilateral maxillary antrotomies, bilateral total ethmoidectomies, bilateral frontal recess explorations, bilateral sphenoidotomies, Instatrac guidance./notes 07/28/2010  . PACEMAKER IMPLANT N/A 05/02/2018   Procedure: PACEMAKER IMPLANT;  Surgeon: Evans Lance, MD;  Location: [facility];  Service: Cardiovascular;  Laterality: N/A;     OB History   No obstetric history on file.     Family History  Problem Relation Age of Onset  . Kidney disease Mother   . Hypertension Mother   . Other Father        gangrene with amputation  . Diabetes Sister   . Cancer Brother   . Heart disease Brother   . Schizophrenia Daughter   . Diabetes Daughter   . Stroke Daughter   . Diabetes Sister   . Breast cancer Neg Hx     Social History   Tobacco Use  . Smoking status: Never Smoker  . Smokeless tobacco: Former Systems developer    Types: Secondary school teacher  . Vaping Use: Never used  Substance Use Topics  . Alcohol use: No  .  Drug use: No    Home Medications Prior to Admission medications   Medication Sig Start Date End Date Taking? Authorizing Provider  acetaminophen (TYLENOL) 325 MG tablet Take 650 mg by mouth every 6 (six) hours as needed for headache (pain).    [provider]  albuterol (PROVENTIL HFA;VENTOLIN HFA) 108 (90 Base) MCG/ACT inhaler Inhale 1-2 puffs into the lungs every 4 (four) hours as needed for wheezing or shortness of breath.     [provider]  alendronate (FOSAMAX) 70 MG tablet Take 70 mg by mouth once a week. Take 1 tablet by mouth only on  Sundays    [provider]  amLODipine (NORVASC) 10 MG tablet Take 1 tablet (10 mg total) by mouth daily. 05/13/19 05/12/20  Shelly Coss, MD  busPIRone (BUSPAR) 5 MG tablet Take 5 mg by mouth 3 (three) times daily. 11/24/17   [provider]  Calcium Citrate-Vitamin D (CALCIUM CITRATE + D) 315-250 MG-UNIT TABS Take 1 tablet by mouth daily.    [provider]  donepezil (ARICEPT) 10 MG tablet Take 10 mg by mouth at bedtime.  02/24/15   [provider]  escitalopram (LEXAPRO) 10 MG tablet Take 10 mg by mouth at bedtime. 06/16/17   [provider]  FLOVENT HFA 220 MCG/ACT inhaler Inhale 1 puff into the lungs 2 (two) times daily. Patient taking differently: Inhale 1 puff into the lungs 2 (two) times daily as needed (sob/wheezing).  05/31/18   Mannam, [place] Robinsons, MD  memantine (NAMENDA XR) 28 MG CP24 24 hr capsule Take 28 mg by mouth daily.    [provider]  metFORMIN (GLUCOPHAGE-XR) 500 MG 24 hr tablet Take 500 mg by mouth at bedtime. 03/24/19   [provider]  montelukast (SINGULAIR) 10 MG tablet Take 10 mg by mouth at bedtime.  06/21/17   [provider]  pravastatin (PRAVACHOL) 10 MG tablet Take 10 mg by mouth at bedtime. 12/12/19   [provider]    Allergies    Aspirin  Review of Systems   Review of Systems  Unable to perform ROS: Dementia    Physical Exam Updated Vital Signs BP 97/61 (BP Location: Right Arm)   Pulse 66   Temp 98.5 F (36.9 C) (Oral)   Resp 16   Ht '5\' 4"'$  (1.626 m)   Wt 58.5 kg   SpO2 98%   BMI 22.14 kg/m   Physical Exam Vitals and nursing note reviewed.  Constitutional:      General: She is not in acute distress.    Appearance: She is well-developed.  HENT:     Head: Normocephalic and atraumatic.     Right Ear: External ear normal.     Left Ear: External ear normal.     Nose: Nose normal.  Eyes:     Conjunctiva/sclera: Conjunctivae normal.  Cardiovascular:     Rate and Rhythm:  Normal rate and regular rhythm.     Heart sounds: No murmur heard.   Pulmonary:     Effort: No respiratory distress.     Breath sounds: No wheezing, rhonchi or rales.  Abdominal:     Palpations: Abdomen is soft.     Tenderness: There is no abdominal tenderness. There is no guarding or rebound.     Comments: Colostomy in place  Musculoskeletal:     Cervical back: Normal range of motion and neck supple.     Right lower leg: No edema.     Left lower leg: No edema.  Skin:    General: Skin is warm and dry.     Findings: No rash.  Neurological:     General: No focal deficit present.     Mental Status: She is alert. Mental status is at baseline.     Motor: No weakness.  Psychiatric:        Mood and Affect: Mood normal.     ED Results / Procedures / Treatments   Labs (all labs ordered are listed, but only abnormal results are displayed) Labs Reviewed  CBC WITH DIFFERENTIAL/PLATELET - Abnormal; Notable for the following components:      Result Value   RBC 3.71 (*)    Hemoglobin 11.4 (*)    All other components within normal limits  BASIC METABOLIC PANEL - Abnormal; Notable for the following components:   CO2 18 (*)    BUN 54 (*)    Creatinine, Ser 2.47 (*)    GFR, Estimated 19 (*)    Anion gap 16 (*)    All other components within normal limits  URINE CULTURE  URINALYSIS, ROUTINE W REFLEX MICROSCOPIC    EKG None  Radiology No results found.  Procedures Procedures (including critical care time)  Medications Ordered in ED Medications - No data to display  ED Course  I have reviewed the triage vital signs and the nursing notes.  Pertinent labs & imaging results that were available during my care of the patient were reviewed by me and considered in my medical decision making (see chart for details).  Patient seen and examined. Work-up initiated. I tried twice to call patient's gradndaughter -- got vm, states mailbox is full.   Vital signs reviewed and are as  follows: BP 97/61 (BP Location: Right Arm)   Pulse 66   Temp 98.5 F (36.9 C) (Oral)   Resp 16   Ht '5\' 4"'$  (1.626 m)   Wt 58.5 kg   SpO2 98%   BMI 22.14 kg/m   9:36 PM Elevated creatinine. Tried family again. RN has not heard from family either. Awaiting UA.   10:10 PM patient receiving IV fluid bolus.  Signed out to [employer] at shift change.  Awaiting UA.  Will need to touch base with family.  She may just require hydration and discharged home depending upon her history.  If symptoms are substantial, she may require admission to the hospital for IV fluids.  Will need family input.    MDM Rules/Calculators/A&P                             Final Clinical Impression(s) / ED Diagnoses Final diagnoses:  None    Rx / DC Orders ED Discharge Orders    None       Suann Larry 04/06/20 2211    Milton Ferguson, MD 04/06/20 2222

## 2020-04-06 NOTE — ED Triage Notes (Signed)
Per EMS- [name] reports that the patient has had decreased urinary output. Patient has dementia.

## 2020-04-06 NOTE — ED Provider Notes (Signed)
87 year old female received a signout from [place] pending labs and re-evaluation. Per his HPI:   "[name] is a 87 y.o. female.  Patient with history of chronic kidney disease, dementia, dehydration, HFpEF (grade 2, Echo 2019) --presents from home by EMS. Per EMS report is that patient has had decreased urinary output.  Level 5 caveat due to dementia.  Patient has no complaints currently."  Patient also has a history of SBO s/p colostomy.   Spoke with the patient's granddaughter, [name]. [name] called EMS because the patient has not voided today. Last void was yesterday (1/21) morning. Over the last week, she is only voided about once a day and her urine has been very dark. She has also had decreased ostomy output for about a week and [name] has not changed her ostomy bag since last week.  She also reports that the patient has been very fatigued and has been sleeping almost all day every day for the last week. She has not been wanting to eat or drink. She also reports that the patient's had a nonproductive cough over the last week and several other family members in the home have had cold-like symptoms; however, no one has been tested for COVID-19.  The patient ambulates with a walker at baseline, but over the last 2 weeks has had increased generalized weakness and has been able to ambulate, but has required more assistance due to appearing unsteady on her feet. No recent falls.  She denies fever, chills, shortness of breath, vomiting.  [name] is the patient's primary caregiver and is concerned that given her worsening symptoms about her ability to continue caring for the patient at home. She has questions about hospice and palliative care.  Physical Exam  BP 113/61   Pulse 60   Temp 98.5 F (36.9 C) (Oral)   Resp 16   Ht '5\' 4"'$  (1.626 m)   Wt 58.5 kg   SpO2 96%   BMI 22.14 kg/m   Physical Exam Vitals and nursing note reviewed.  Constitutional:      General: She is not in  acute distress.    Comments: Pleasant, elderly patient. No acute distress.  HENT:     Head: Normocephalic.  Eyes:     Conjunctiva/sclera: Conjunctivae normal.  Cardiovascular:     Rate and Rhythm: Normal rate and regular rhythm.     Pulses: Normal pulses.     Heart sounds: Normal heart sounds. No murmur heard. No friction rub. No gallop.   Pulmonary:     Effort: Pulmonary effort is normal. No respiratory distress.     Breath sounds: No stridor. No wheezing, rhonchi or rales.  Chest:     Chest wall: No tenderness.  Abdominal:     General: There is no distension.     Palpations: Abdomen is soft. There is no mass.     Tenderness: There is no abdominal tenderness. There is no right CVA tenderness, left CVA tenderness, guarding or rebound.     Hernia: No hernia is present.     Comments: Abdomen is soft, nontender, nondistended.  Musculoskeletal:     Cervical back: Neck supple.     Right lower leg: No edema.     Left lower leg: No edema.  Skin:    General: Skin is warm.     Findings: No rash.  Neurological:     Mental Status: She is alert.     Comments: Oriented to self. Answers questions in complete, fluent sentences. Moves all  4 extremities spontaneously.  Psychiatric:        Behavior: Behavior normal.     ED Course/Procedures   Clinical Course as of 04/07/20 0219  [name] Apr 07, 2020  0212 Bladder scan 488.  On patient re-evaluation, she has no urge to void.  Will insert Foley catheter. [MM]    Clinical Course User Index [MM] [name], Sandy Purser, PA-C    Procedures  MDM   87 year old female with history of chronic kidney disease, dementia, dehydration, HFpEF (grade 2, Echo 2019), and SBO s/p colostomy received at signout from [name] pending collateral information from family and reevaluation. Please see his note for further work-up and medical decision making.  The patient was discussed with Dr. Sedonia Small, attending physician.   Labs have been reviewed and independently  interpreted by me. Creatinine is 2.47 with a BUN of 54 suggestive of prerenal azotemia, likely from dehydration from poor p.o. intake. Her baseline creatinine appears to be about 1.6-1.8.   She does also have a borderline leukopenia, which is new. Since multiple family members in the home have been ill, question COVID-19. We will add on chest x-ray and COVID-19 test.  COVID-19 test is pending.  Chest x-ray with linear atelectasis in the bilateral bases.   On reevaluation, given history of SBO's and decreased urine output over the last week, bladder scan was obtained, which demonstrated 480 mL.  Patient has no urge to void at this time.  She is on a pure wick, but there is no urine output.  There is concern for urinary retention, which could also be contributing to her AKI.  Will place Foley catheter.  Repeat abdominal exam is benign.  Consult to the hospitalist and Dr. Alcario Drought will accept the patient for admission.  The patient appears reasonably stabilized for admission considering the current resources, flow, and capabilities available in the ED at this time, and I doubt any other [name] requiring further screening and/or treatment in the ED prior to admission.    Sandy Gavel, PA-C 04/07/20 0219    Sandy Flakes, MD 04/07/20 [phone]

## 2020-04-06 NOTE — ED Notes (Signed)
Pt granddaughter called and would like to speak with DR when they see pt her number is under pt contact

## 2020-04-07 ENCOUNTER — Other Ambulatory Visit: Payer: Self-pay

## 2020-04-07 ENCOUNTER — Emergency Department (HOSPITAL_COMMUNITY): Payer: Medicare Other

## 2020-04-07 DIAGNOSIS — F0150 Vascular dementia without behavioral disturbance: Secondary | ICD-10-CM

## 2020-04-07 DIAGNOSIS — R059 Cough, unspecified: Secondary | ICD-10-CM | POA: Diagnosis not present

## 2020-04-07 DIAGNOSIS — N1832 Chronic kidney disease, stage 3b: Secondary | ICD-10-CM | POA: Diagnosis not present

## 2020-04-07 DIAGNOSIS — G9340 Encephalopathy, unspecified: Secondary | ICD-10-CM | POA: Diagnosis not present

## 2020-04-07 DIAGNOSIS — J069 Acute upper respiratory infection, unspecified: Secondary | ICD-10-CM | POA: Diagnosis not present

## 2020-04-07 DIAGNOSIS — R279 Unspecified lack of coordination: Secondary | ICD-10-CM | POA: Diagnosis not present

## 2020-04-07 DIAGNOSIS — Z818 Family history of other mental and behavioral disorders: Secondary | ICD-10-CM | POA: Diagnosis not present

## 2020-04-07 DIAGNOSIS — F0390 Unspecified dementia without behavioral disturbance: Secondary | ICD-10-CM | POA: Diagnosis present

## 2020-04-07 DIAGNOSIS — R001 Bradycardia, unspecified: Secondary | ICD-10-CM | POA: Diagnosis not present

## 2020-04-07 DIAGNOSIS — N179 Acute kidney failure, unspecified: Secondary | ICD-10-CM | POA: Diagnosis not present

## 2020-04-07 DIAGNOSIS — R339 Retention of urine, unspecified: Secondary | ICD-10-CM | POA: Diagnosis not present

## 2020-04-07 DIAGNOSIS — E118 Type 2 diabetes mellitus with unspecified complications: Secondary | ICD-10-CM | POA: Diagnosis not present

## 2020-04-07 DIAGNOSIS — Z833 Family history of diabetes mellitus: Secondary | ICD-10-CM | POA: Diagnosis not present

## 2020-04-07 DIAGNOSIS — Z433 Encounter for attention to colostomy: Secondary | ICD-10-CM | POA: Diagnosis not present

## 2020-04-07 DIAGNOSIS — Z79899 Other long term (current) drug therapy: Secondary | ICD-10-CM | POA: Diagnosis not present

## 2020-04-07 DIAGNOSIS — Z743 Need for continuous supervision: Secondary | ICD-10-CM | POA: Diagnosis not present

## 2020-04-07 DIAGNOSIS — J438 Other emphysema: Secondary | ICD-10-CM | POA: Diagnosis not present

## 2020-04-07 DIAGNOSIS — Z841 Family history of disorders of kidney and ureter: Secondary | ICD-10-CM | POA: Diagnosis not present

## 2020-04-07 DIAGNOSIS — M6281 Muscle weakness (generalized): Secondary | ICD-10-CM | POA: Diagnosis not present

## 2020-04-07 DIAGNOSIS — Z823 Family history of stroke: Secondary | ICD-10-CM | POA: Diagnosis not present

## 2020-04-07 DIAGNOSIS — Z809 Family history of malignant neoplasm, unspecified: Secondary | ICD-10-CM | POA: Diagnosis not present

## 2020-04-07 DIAGNOSIS — Z7983 Long term (current) use of bisphosphonates: Secondary | ICD-10-CM | POA: Diagnosis not present

## 2020-04-07 DIAGNOSIS — I5032 Chronic diastolic (congestive) heart failure: Secondary | ICD-10-CM | POA: Diagnosis not present

## 2020-04-07 DIAGNOSIS — Z7189 Other specified counseling: Secondary | ICD-10-CM | POA: Diagnosis not present

## 2020-04-07 DIAGNOSIS — Z933 Colostomy status: Secondary | ICD-10-CM | POA: Diagnosis not present

## 2020-04-07 DIAGNOSIS — G9341 Metabolic encephalopathy: Secondary | ICD-10-CM | POA: Diagnosis not present

## 2020-04-07 DIAGNOSIS — J80 Acute respiratory distress syndrome: Secondary | ICD-10-CM | POA: Diagnosis not present

## 2020-04-07 DIAGNOSIS — E872 Acidosis: Secondary | ICD-10-CM | POA: Diagnosis present

## 2020-04-07 DIAGNOSIS — Z8249 Family history of ischemic heart disease and other diseases of the circulatory system: Secondary | ICD-10-CM | POA: Diagnosis not present

## 2020-04-07 DIAGNOSIS — Z7984 Long term (current) use of oral hypoglycemic drugs: Secondary | ICD-10-CM | POA: Diagnosis not present

## 2020-04-07 DIAGNOSIS — I130 Hypertensive heart and chronic kidney disease with heart failure and stage 1 through stage 4 chronic kidney disease, or unspecified chronic kidney disease: Secondary | ICD-10-CM | POA: Diagnosis present

## 2020-04-07 DIAGNOSIS — R1312 Dysphagia, oropharyngeal phase: Secondary | ICD-10-CM | POA: Diagnosis not present

## 2020-04-07 DIAGNOSIS — J449 Chronic obstructive pulmonary disease, unspecified: Secondary | ICD-10-CM | POA: Diagnosis not present

## 2020-04-07 DIAGNOSIS — E785 Hyperlipidemia, unspecified: Secondary | ICD-10-CM | POA: Diagnosis present

## 2020-04-07 DIAGNOSIS — J9811 Atelectasis: Secondary | ICD-10-CM | POA: Diagnosis not present

## 2020-04-07 DIAGNOSIS — U071 COVID-19: Secondary | ICD-10-CM

## 2020-04-07 DIAGNOSIS — N183 Chronic kidney disease, stage 3 unspecified: Secondary | ICD-10-CM | POA: Diagnosis not present

## 2020-04-07 DIAGNOSIS — Z950 Presence of cardiac pacemaker: Secondary | ICD-10-CM | POA: Diagnosis not present

## 2020-04-07 DIAGNOSIS — R2681 Unsteadiness on feet: Secondary | ICD-10-CM | POA: Diagnosis not present

## 2020-04-07 DIAGNOSIS — E860 Dehydration: Secondary | ICD-10-CM | POA: Diagnosis not present

## 2020-04-07 DIAGNOSIS — Z515 Encounter for palliative care: Secondary | ICD-10-CM | POA: Diagnosis not present

## 2020-04-07 DIAGNOSIS — E1140 Type 2 diabetes mellitus with diabetic neuropathy, unspecified: Secondary | ICD-10-CM | POA: Diagnosis present

## 2020-04-07 DIAGNOSIS — E1122 Type 2 diabetes mellitus with diabetic chronic kidney disease: Secondary | ICD-10-CM | POA: Diagnosis present

## 2020-04-07 LAB — BASIC METABOLIC PANEL
Anion gap: 14 (ref 5–15)
BUN: 49 mg/dL — ABNORMAL HIGH (ref 8–23)
CO2: 17 mmol/L — ABNORMAL LOW (ref 22–32)
Calcium: 9 mg/dL (ref 8.9–10.3)
Chloride: 111 mmol/L (ref 98–111)
Creatinine, Ser: 2.29 mg/dL — ABNORMAL HIGH (ref 0.44–1.00)
GFR, Estimated: 21 mL/min — ABNORMAL LOW (ref >60)
Glucose, Bld: 75 mg/dL (ref 70–99)
Potassium: 4.6 mmol/L (ref 3.5–5.1)
Sodium: 142 mmol/L (ref 135–145)

## 2020-04-07 LAB — HEPATIC FUNCTION PANEL
ALT: 8 U/L (ref 0–44)
AST: 15 U/L (ref 15–41)
Albumin: 3.1 g/dL — ABNORMAL LOW (ref 3.5–5.0)
Alkaline Phosphatase: 55 U/L (ref 38–126)
Bilirubin, Direct: 0.1 mg/dL (ref 0.0–0.2)
Indirect Bilirubin: 0.9 mg/dL (ref 0.3–0.9)
Total Bilirubin: 1 mg/dL (ref 0.3–1.2)
Total Protein: 6.1 g/dL — ABNORMAL LOW (ref 6.5–8.1)

## 2020-04-07 LAB — GLUCOSE, CAPILLARY
Glucose-Capillary: 71 mg/dL (ref 70–99)
Glucose-Capillary: 130 mg/dL — ABNORMAL HIGH (ref 70–99)

## 2020-04-07 LAB — CBC
HCT: 33.9 % — ABNORMAL LOW (ref 36.0–46.0)
Hemoglobin: 10.5 g/dL — ABNORMAL LOW (ref 12.0–15.0)
MCH: 31.2 pg (ref 26.0–34.0)
MCHC: 31 g/dL (ref 30.0–36.0)
MCV: 100.6 fL — ABNORMAL HIGH (ref 80.0–100.0)
Platelets: 149 10*3/uL — ABNORMAL LOW (ref 150–400)
RBC: 3.37 MIL/uL — ABNORMAL LOW (ref 3.87–5.11)
RDW: 12.4 % (ref 11.5–15.5)
WBC: 3.5 10*3/uL — ABNORMAL LOW (ref 4.0–10.5)
nRBC: 0 % (ref 0.0–0.2)

## 2020-04-07 LAB — D-DIMER, QUANTITATIVE: D-Dimer, Quant: 9.87 ug/mL-FEU — ABNORMAL HIGH (ref 0.00–0.50)

## 2020-04-07 LAB — CBG MONITORING, ED
Glucose-Capillary: 63 mg/dL — ABNORMAL LOW (ref 70–99)
Glucose-Capillary: 97 mg/dL (ref 70–99)
Glucose-Capillary: 68 mg/dL — ABNORMAL LOW (ref 70–99)

## 2020-04-07 LAB — HEMOGLOBIN A1C
Hgb A1c MFr Bld: 5.7 % — ABNORMAL HIGH (ref 4.8–5.6)
Mean Plasma Glucose: 116.89 mg/dL

## 2020-04-07 LAB — TSH: TSH: 2.556 u[IU]/mL (ref 0.350–4.500)

## 2020-04-07 LAB — C-REACTIVE PROTEIN: CRP: 3 mg/dL — ABNORMAL HIGH (ref <1.0)

## 2020-04-07 LAB — SARS CORONAVIRUS 2 (TAT 6-24 HRS): SARS Coronavirus 2: POSITIVE — AB

## 2020-04-07 LAB — AMMONIA: Ammonia: 17 umol/L (ref 9–35)

## 2020-04-07 MED ORDER — INSULIN ASPART 100 UNIT/ML ~~LOC~~ SOLN
0.0000 [IU] | Freq: Every day | SUBCUTANEOUS | Status: DC
  Administered 2020-04-08: 2 [IU] via SUBCUTANEOUS
  Filled 2020-04-07: qty 0.05

## 2020-04-07 MED ORDER — INSULIN ASPART 100 UNIT/ML ~~LOC~~ SOLN
0.0000 [IU] | Freq: Three times a day (TID) | SUBCUTANEOUS | Status: DC
  Administered 2020-04-08: 5 [IU] via SUBCUTANEOUS
  Administered 2020-04-08: 1 [IU] via SUBCUTANEOUS
  Administered 2020-04-08: 2 [IU] via SUBCUTANEOUS
  Administered 2020-04-09: 1 [IU] via SUBCUTANEOUS
  Administered 2020-04-09: 2 [IU] via SUBCUTANEOUS
  Administered 2020-04-10: 1 [IU] via SUBCUTANEOUS
  Administered 2020-04-11: 2 [IU] via SUBCUTANEOUS
  Filled 2020-04-07: qty 0.09

## 2020-04-07 MED ORDER — ONDANSETRON HCL 4 MG/2ML IJ SOLN: 4.0000 mg | Freq: Four times a day (QID) | INTRAMUSCULAR | Status: DC | PRN

## 2020-04-07 MED ORDER — SODIUM CHLORIDE 0.9 % IV SOLN: Freq: Once | INTRAVENOUS | Status: AC

## 2020-04-07 MED ORDER — CHLORHEXIDINE GLUCONATE CLOTH 2 % EX PADS
6.0000 | MEDICATED_PAD | Freq: Every day | CUTANEOUS | Status: DC
  Administered 2020-04-07 – 2020-04-11 (×5): 6 via TOPICAL

## 2020-04-07 MED ORDER — MONTELUKAST SODIUM 10 MG PO TABS
10.0000 mg | ORAL_TABLET | Freq: Every day | ORAL | Status: DC
  Administered 2020-04-07 – 2020-04-10 (×4): 10 mg via ORAL
  Filled 2020-04-07 (×4): qty 1

## 2020-04-07 MED ORDER — FLUTICASONE PROPIONATE HFA 220 MCG/ACT IN AERO: 1.0000 | INHALATION_SPRAY | Freq: Two times a day (BID) | RESPIRATORY_TRACT | Status: DC

## 2020-04-07 MED ORDER — PRAVASTATIN SODIUM 20 MG PO TABS
10.0000 mg | ORAL_TABLET | Freq: Every day | ORAL | Status: DC
Start: 2020-04-07 — End: 2020-04-11
  Administered 2020-04-07 – 2020-04-10 (×4): 10 mg via ORAL
  Filled 2020-04-07 (×4): qty 1

## 2020-04-07 MED ORDER — ACETAMINOPHEN 650 MG RE SUPP: 650.0000 mg | Freq: Four times a day (QID) | RECTAL | Status: DC | PRN

## 2020-04-07 MED ORDER — CALCIUM CARBONATE-VITAMIN D 500-200 MG-UNIT PO TABS
1.0000 | ORAL_TABLET | Freq: Every morning | ORAL | Status: DC
  Administered 2020-04-07 – 2020-04-11 (×5): 1 via ORAL
  Filled 2020-04-07 (×5): qty 1

## 2020-04-07 MED ORDER — DONEPEZIL HCL 10 MG PO TABS
10.0000 mg | ORAL_TABLET | Freq: Every day | ORAL | Status: DC
  Administered 2020-04-07 – 2020-04-10 (×4): 10 mg via ORAL
  Filled 2020-04-07 (×4): qty 1

## 2020-04-07 MED ORDER — ALBUTEROL SULFATE HFA 108 (90 BASE) MCG/ACT IN AERS: 1.0000 | INHALATION_SPRAY | RESPIRATORY_TRACT | Status: DC | PRN

## 2020-04-07 MED ORDER — BUDESONIDE 0.5 MG/2ML IN SUSP
0.5000 mg | Freq: Two times a day (BID) | RESPIRATORY_TRACT | Status: DC
  Administered 2020-04-07: 0.5 mg via RESPIRATORY_TRACT
  Filled 2020-04-07 (×3): qty 2

## 2020-04-07 MED ORDER — ENSURE ENLIVE PO LIQD
237.0000 mL | Freq: Two times a day (BID) | ORAL | Status: DC
  Administered 2020-04-07 – 2020-04-11 (×8): 237 mL via ORAL

## 2020-04-07 MED ORDER — BUSPIRONE HCL 5 MG PO TABS
5.0000 mg | ORAL_TABLET | Freq: Three times a day (TID) | ORAL | Status: DC
  Administered 2020-04-07 – 2020-04-11 (×13): 5 mg via ORAL
  Filled 2020-04-07 (×13): qty 1

## 2020-04-07 MED ORDER — ESCITALOPRAM OXALATE 10 MG PO TABS
10.0000 mg | ORAL_TABLET | Freq: Every evening | ORAL | Status: DC
  Administered 2020-04-07 – 2020-04-10 (×4): 10 mg via ORAL
  Filled 2020-04-07 (×4): qty 1

## 2020-04-07 MED ORDER — SODIUM CHLORIDE 0.9 % IV SOLN: INTRAVENOUS | Status: DC

## 2020-04-07 MED ORDER — ACETAMINOPHEN 325 MG PO TABS: 650.0000 mg | ORAL_TABLET | Freq: Four times a day (QID) | ORAL | Status: DC | PRN

## 2020-04-07 MED ORDER — ONDANSETRON HCL 4 MG PO TABS: 4.0000 mg | ORAL_TABLET | Freq: Four times a day (QID) | ORAL | Status: DC | PRN

## 2020-04-07 MED ORDER — MEMANTINE HCL ER 28 MG PO CP24
28.0000 mg | ORAL_CAPSULE | Freq: Every morning | ORAL | Status: DC
  Administered 2020-04-07 – 2020-04-11 (×5): 28 mg via ORAL
  Filled 2020-04-07 (×5): qty 1

## 2020-04-07 MED ORDER — HEPARIN SODIUM (PORCINE) 5000 UNIT/ML IJ SOLN
5000.0000 [IU] | Freq: Three times a day (TID) | INTRAMUSCULAR | Status: DC
  Administered 2020-04-07 – 2020-04-11 (×14): 5000 [IU] via SUBCUTANEOUS
  Filled 2020-04-07 (×14): qty 1

## 2020-04-07 NOTE — ED Notes (Signed)
Patients granddaughter called and stating she is worried about patients lack of appetite, overall weakness. She said she sleeps all day and night. Patient has pacemaker.  [phone] Shaquaya Knisley (granddaughter who lives with her)

## 2020-04-07 NOTE — Progress Notes (Signed)
PROGRESS NOTE    ZOELLA HAMMAKER  [id number] DOB: 11/19/1933 DOA: 04/06/2020 PCP: Leeroy Cha, MD   Brief Narrative: [name] is a 87 y.o. female with a history of CKD stage III, dementia, chronic diastolic heart failure, colostomy. Patient presented secondary to decreased urine output and mental status change and found to have dehydration and AKI from poor oral intake. COVID-19 positive. Started on IV fluids.   Assessment & Plan:   Principal Problem:   AKI (acute kidney injury) ([place]) Active Problems:   Diabetes mellitus with complication (HCC)   Acute encephalopathy   Dementia (HCC)   CKD (chronic kidney disease) stage 3, GFR 30-59 ml/min   AKI on CKD stage IIIb Baseline appears to be around 1.7-1.8. Creatinine of 2.47 on admission. Started on IV fluids with improving creatinine  Acute metabolic encephalopathy Secondary to dehydration. Seems to have returned to baseline with IV fluid resuscitation.  Decreased urine output Likely secondary to dehydration from decreased oral hydration related to dementia. -Strict in/out  COVID-19 infection Patient is unvaccinated. No symptoms. No pneumonia on chest x-ray. No hypoxia. -Daily CMP, CBC, D-dimer, CRP  Dementia It seems patient is appearing end-stage as patient has decreased desire to eat and drink. Discussed this with granddaughter who understands progression of dementia. Will likely qualify for home with hospice. -Palliative care consult -Continue Namenda, Buspar, Aricept  Diabetes mellitus, type 2 Patient is on metformin as an outpatient. Hemoglobin A1C of 5.7% this admission. -Continue SSI -May need to discontinue metformin on discharge secondary to kidney function  Metabolic acidosis High anion gap. Secondary to AKI.  Chronic diastolic heart failure Stable.  COPD -Continue Pulmicort  Hyperlipidemia -Continue pravastatin  History of symptomatic bradycardia S/p pacemaker.  S/p  colostomy WOC consulted.   DVT prophylaxis: Heparin subq Code Status:   Code Status: Full Code Family Communication: Granddaughter on telephone (12 minutes) Disposition Plan: Discharge home possible with home hospice. Anticipate discharge in 2-3 days if kidney function continues to improve, pending palliative care recommendations, no worsening symptoms from COVID-19 infection   Consultants:   Palliative care medicine  Procedures:   None  Antimicrobials:  None    Subjective: No issues today. Feels well. No dyspnea.  Objective: Vitals:   04/07/20 0600 04/07/20 0630 04/07/20 0900 04/07/20 1200  BP: 123/65 107/67 (!) 101/54 121/65  Pulse: 60 60 63 (!) 59  Resp: '15 14 18 16  '$ Temp:      TempSrc:      SpO2: 96% 98% 94% 97%  Weight:      Height:        Intake/Output Summary (Last 24 hours) at 04/07/2020 1219 Last data filed at 04/07/2020 0444 Gross per 24 hour  Intake 500 ml  Output -  Net 500 ml   Filed Weights   04/06/20 1528  Weight: 58.5 kg    Examination:  General exam: Appears calm and comfortable  Respiratory system: Clear to auscultation. Respiratory effort normal. Cardiovascular system: S1 & S2 heard, RRR. No murmurs, rubs, gallops or clicks. Gastrointestinal system: Abdomen is nondistended, soft and nontender. No organomegaly or masses felt. Normal bowel sounds heard. Central nervous system: Alert and oriented to person and place. No focal neurological deficits. Musculoskeletal: No edema. No calf tenderness Skin: No cyanosis. No rashes Psychiatry: Judgement and insight appear normal. Mood & affect appropriate.     Data Reviewed: I have personally reviewed following labs and imaging studies  CBC Lab Results  Component Value Date   WBC 3.5 (L)  04/07/2020   RBC 3.37 (L) 04/07/2020   HGB 10.5 (L) 04/07/2020   HCT 33.9 (L) 04/07/2020   MCV 100.6 (H) 04/07/2020   MCH 31.2 04/07/2020   PLT 149 (L) 04/07/2020   MCHC 31.0 04/07/2020   RDW 12.4  04/07/2020   LYMPHSABS 1.3 04/06/2020   MONOABS 0.6 04/06/2020   EOSABS 0.1 04/06/2020   BASOSABS 0.0 [id number]     Last metabolic panel Lab Results  Component Value Date   NA 142 04/07/2020   K 4.6 04/07/2020   CL 111 04/07/2020   CO2 17 (L) 04/07/2020   BUN 49 (H) 04/07/2020   CREATININE 2.29 (H) 04/07/2020   GLUCOSE 75 04/07/2020   GFRNONAA 21 (L) 04/07/2020   GFRAA 26 (L) 06/08/2019   CALCIUM 9.0 04/07/2020   PROT 6.1 (L) 04/07/2020   ALBUMIN 3.1 (L) 04/07/2020   BILITOT 1.0 04/07/2020   ALKPHOS 55 04/07/2020   AST 15 04/07/2020   ALT 8 04/07/2020   ANIONGAP 14 04/07/2020    CBG (last 3)  Recent Labs    04/07/20 0758 04/07/20 0914  GLUCAP 63* 97     GFR: Estimated Creatinine Clearance: 15.8 mL/min (A) (by C-G formula based on SCr of 2.29 mg/dL (H)).  Coagulation Profile: No results for input(s): INR, PROTIME in the last 168 hours.  Recent Results (from the past 240 hour(s))  SARS CORONAVIRUS 2 (TAT 6-24 HRS) Nasopharyngeal Nasopharyngeal Swab     Status: Abnormal   Collection Time: 04/07/20  1:04 AM   Specimen: Nasopharyngeal Swab  Result Value Ref Range Status   SARS Coronavirus 2 POSITIVE (A) NEGATIVE Final    Comment: (NOTE) SARS-CoV-2 target nucleic acids are DETECTED.  The SARS-CoV-2 RNA is generally detectable in upper and lower respiratory specimens during the acute phase of infection. Positive results are indicative of the presence of SARS-CoV-2 RNA. Clinical correlation with patient history and other diagnostic information is  necessary to determine patient infection status. Positive results do not rule out bacterial infection or co-infection with other viruses.  The expected result is Negative.  Fact Sheet for Patients: SugarRoll.be  Fact Sheet for Healthcare Providers: https://www.woods-mathews.com/  This test is not yet approved or cleared by the [place] FDA and  has been authorized for  detection and/or diagnosis of SARS-CoV-2 by FDA under an Emergency Use Authorization (EUA). This EUA will remain  in effect (meaning this test can be used) for the duration of the COVID-19 declaration under Section 564(b)(1) of the Act, 21 U. S.C. section 360bbb-3(b)(1), unless the authorization is terminated or revoked sooner.   Performed at [facility], [place] [address], [place], [place] [address]         Radiology Studies: DG Chest Portable 1 View  Result Date: 04/07/2020 CLINICAL DATA:  Cough EXAM: PORTABLE CHEST 1 VIEW COMPARISON:  05/10/2019 FINDINGS: Cardiac pacemaker. Heart size and pulmonary vascularity are normal. Probable linear atelectasis in the lung bases. No airspace disease or consolidation. No pleural effusions. No pneumothorax. Degenerative changes in the spine and shoulders. IMPRESSION: Probable linear atelectasis in the lung bases. Electronically Signed   By: Lucienne Capers M.D.   On: 04/07/2020 01:23        Scheduled Meds: . budesonide (PULMICORT) nebulizer solution  0.5 mg Nebulization BID  . busPIRone  5 mg Oral TID  . calcium-vitamin D  1 tablet Oral q morning - 10a  . donepezil  10 mg Oral QHS  . escitalopram  10 mg Oral QPM  .  heparin  5,000 Units Subcutaneous Q8H  . insulin aspart  0-5 Units Subcutaneous QHS  . insulin aspart  0-9 Units Subcutaneous TID WC  . memantine  28 mg Oral q morning - 10a  . montelukast  10 mg Oral QHS  . pravastatin  10 mg Oral QHS   Continuous Infusions: . sodium chloride 100 mL/hr at 04/07/20 0431     LOS: 0 days     Cordelia Poche, MD [facility] 04/07/2020, 12:19 PM  If 7PM-7AM, please contact night-coverage [email or web address]

## 2020-04-07 NOTE — ED Notes (Signed)
Called report to [employer] on 5W

## 2020-04-07 NOTE — Progress Notes (Signed)
Pt has arrived to room 1528 from ED. Alert. No complaints.

## 2020-04-07 NOTE — ED Notes (Signed)
[id number], providing breakfast tray. Patient sitting up in bed A&O

## 2020-04-07 NOTE — Consult Note (Signed)
[facility] Nurse ostomy consult note Stoma type/location: LLQ Colostomy. Present for many years. My partner M. Liane Comber saw this patient approximately 1 year ago and her note indicates that ostomy has been present since 2004. An admission from 2014 is noted in chart review due to a partial bowel obstruction and parastomal hernia. Patient admitted with Chronic kidney disease, dementia and dehydration. Stomal assessment/size: Not measured today Peristomal assessment: Not seen beneath intact pouch Treatment options for stomal/peristomal skin: N/A Output: Stool-stained pouch according to NT.  Ostomy pouching: 1pc.convex pouch, [place] [id number]. This is pouch Kellie Simmering # [phone] in the [facility] system.  Education provided: None. Patient has dementia according to MD notes and will require ostomy care from staff while here. Enrolled patient in [place] program: No  I will provide the Nursing staff with ordering guidance for pouch replacement and for obtaining bedside supply in a sufficient quantity. As noted above, patient will require assistance with ostomy management while in house.  [facility] nursing team will not follow, but will remain available to this patient, the nursing and medical teams.  Please re-consult if needed. Thanks, Maudie Flakes, MSN, RN, [place], Arther Abbott  Pager# [phone]

## 2020-04-07 NOTE — ED Notes (Signed)
Bladder scan revealed 452m.

## 2020-04-07 NOTE — ED Notes (Signed)
Yola Feltz, granddaughter, would like an update, [phone].

## 2020-04-07 NOTE — H&P (Signed)
History and Physical    [name] [id number] DOB: [birth year] DOA: 04/06/2020  PCP: Leeroy Cha, MD  Patient coming from: Home  I have personally briefly reviewed patient's old medical records in [place]  Chief Complaint: Decreased UOP  HPI: [name] is a 87 y.o. female with medical history significant of CKD 3, dementia, HFpEF, colostomy present.  Per granddaughter: pt hasnt voided today.  Last void was 1/21 AM.  Over last week she only voided about once a day.  Today urine has been very dark.  Also decreased ostomy output for past week and PO intake.  Increased fatigue, pt wanting to sleep almost all day, every day for past week.  Pt has had non-productive cough.  Other family members have had cold like symptoms.  However, no one has been tested for COVID-19.  Pt ambulates with walker at baseline, but over past 2 weeks has increased generalized weakness and unable to ambulate / unsteady on feet.  No recent falls.  Pt denies fever, chills, SOB, vomiting, or other complaints.   ED Course: Pt with AKI: BUN 54, creat 2.47.  Looks like her baseline runs about 1.6-1.8.  AG 16.  No SIRS.  CXR neg.  UA neg other than 20 keytones.  Bladder scan shows 439m but pt without urgency to void.   Review of Systems: As per HPI, otherwise all review of systems negative.  Note that pt is demented though.  Past Medical History:  Diagnosis Date  . Asthma   . CKD (chronic kidney disease) stage 3, GFR 30-59 ml/min ([facility] 05/01/2018  . Colostomy present ([facility]    hx/notes 07/17/2010  . Dementia ([facility]   . Diabetic neuropathy ([facility] [place]   . Hypertension    hx/notes 07/17/2010  . Pneumonia    recent/notes 10/08/2016  . Symptomatic bradycardia 12/15/2017  . Type II diabetes mellitus ([facility]    hx/notes 07/17/2010    Past Surgical History:  Procedure Laterality Date  . ABDOMINAL HYSTERECTOMY    . COLOSTOMY     S/P SBO hx/notes 07/17/2010  . LUMBAR FUSION  11/2006    hx/notes 07/17/2010  . NASAL SINUS SURGERY  04/28/2002   Bilateral inferior turbinate reductions, bilateral maxillary antrotomies, bilateral total ethmoidectomies, bilateral frontal recess explorations, bilateral sphenoidotomies, Instatrac guidance./notes 07/28/2010  . PACEMAKER IMPLANT N/A 05/02/2018   Procedure: PACEMAKER IMPLANT;  Surgeon: TEvans Lance MD;  [facility];  Service: Cardiovascular;  Laterality: N/A;     reports that she has never smoked. She quit smokeless tobacco use about 3 years ago.  Her smokeless tobacco use included chew. She reports that she does not drink alcohol and does not use drugs.  Allergies  Allergen Reactions  . Aspirin Other (See Comments)    Triggers asthma    Family History  Problem Relation Age of Onset  . Kidney disease Mother   . Hypertension Mother   . Other Father        gangrene with amputation  . Diabetes Sister   . Cancer Brother   . Heart disease Brother   . Schizophrenia Daughter   . Diabetes Daughter   . Stroke Daughter   . Diabetes Sister   . Breast cancer Neg Hx      Prior to Admission medications   Medication Sig Start Date End Date Taking? Authorizing Provider  acetaminophen (TYLENOL) 500 MG tablet Take 500 mg by mouth daily as needed for fever or headache (pain).   Yes [provider]  albuterol (PROVENTIL HFA;VENTOLIN HFA) 108 (90 Base) MCG/ACT inhaler Inhale 1-2 puffs into the lungs every 4 (four) hours as needed for wheezing or shortness of breath.    Yes [provider]  alendronate (FOSAMAX) 70 MG tablet Take 70 mg by mouth every Sunday.   Yes [provider]  busPIRone (BUSPAR) 5 MG tablet Take 5 mg by mouth 3 (three) times daily. 11/24/17  Yes [provider]  Calcium Carb-Cholecalciferol (CALCIUM 600-D PO) Take 1 tablet by mouth every morning.   Yes [provider]  donepezil (ARICEPT) 10 MG tablet Take 10 mg by mouth at bedtime.  02/24/15  Yes [provider]  escitalopram (LEXAPRO) 10 MG tablet Take 10 mg by mouth every evening. 06/16/17  Yes [provider]  FLOVENT HFA 220 MCG/ACT inhaler Inhale 1 puff into the lungs 2 (two) times daily. Patient taking differently: Inhale 1 puff into the lungs 2 (two) times daily as needed (sob/wheezing). 05/31/18  Yes Mannam, Praveen, MD  memantine (NAMENDA XR) 28 MG CP24 24 hr capsule Take 28 mg by mouth every morning.   Yes [provider]  metFORMIN (GLUCOPHAGE-XR) 500 MG 24 hr tablet Take 500 mg by mouth daily with supper. 03/24/19  Yes [provider]  montelukast (SINGULAIR) 10 MG tablet Take 10 mg by mouth at bedtime.  06/21/17  Yes [provider]  pravastatin (PRAVACHOL) 10 MG tablet Take 10 mg by mouth at bedtime. 12/12/19  Yes [provider]    Physical Exam: Vitals:   04/06/20 2300 04/06/20 2330 04/07/20 0030 04/07/20 0130  BP: 106/62 (!) 108/59 98/65 113/61  Pulse: 62 60 65 60  Resp: '15 16 16 16  '$ Temp:      TempSrc:      SpO2: 95% 97% 98% 96%  Weight:      Height:        Constitutional: NAD, calm, comfortable Eyes: PERRL, lids and conjunctivae normal ENMT: Mucous membranes are moist. Posterior pharynx clear of any exudate or lesions.Normal dentition.  Neck: normal, supple, no masses, no thyromegaly Respiratory: clear to auscultation bilaterally, no wheezing, no crackles. Normal respiratory effort. No accessory muscle use.  Cardiovascular: Regular rate and rhythm, no murmurs / rubs / gallops. No extremity edema. 2+ pedal pulses. No carotid bruits.  Abdomen: no tenderness, no masses palpated. No hepatosplenomegaly. Bowel sounds positive.  Colostomy in place Musculoskeletal: no clubbing / cyanosis. No joint deformity upper and lower extremities. Good ROM, no contractures. Normal muscle tone.  Skin: no rashes, lesions, ulcers. No induration Neurologic: CN 2-12 grossly intact. Sensation intact, DTR normal. Strength 5/5 in all 4.   Psychiatric: Demented, Pleasantly confused, cooperative, no complaints.   Labs on Admission: I have personally reviewed following labs and imaging studies  CBC: Recent Labs  Lab 04/06/20 1918  WBC 4.2  NEUTROABS 2.2  HGB 11.4*  HCT 37.1  MCV 100.0  PLT 0000000   Basic Metabolic Panel: Recent Labs  Lab 04/06/20 1918  NA 141  K 5.0  CL 107  CO2 18*  GLUCOSE 71  BUN 54*  CREATININE 2.47*  CALCIUM 9.8   GFR: Estimated Creatinine Clearance: 14.6 mL/min (A) (by C-G formula based on SCr of 2.47 mg/dL (H)). Liver Function Tests: No results for input(s): AST, ALT, ALKPHOS, BILITOT, PROT, ALBUMIN in the last 168 hours. No results for input(s): LIPASE, AMYLASE in the last 168 hours. No results for input(s): AMMONIA in the last 168 hours. Coagulation Profile: No results for input(s): INR,  PROTIME in the last 168 hours. Cardiac Enzymes: No results for input(s): CKTOTAL, CKMB, CKMBINDEX, TROPONINI in the last 168 hours. BNP (last 3 results) No results for input(s): PROBNP in the last 8760 hours. HbA1C: No results for input(s): HGBA1C in the last 72 hours. CBG: No results for input(s): GLUCAP in the last 168 hours. Lipid Profile: No results for input(s): CHOL, HDL, LDLCALC, TRIG, CHOLHDL, LDLDIRECT in the last 72 hours. Thyroid Function Tests: No results for input(s): TSH, T4TOTAL, FREET4, T3FREE, THYROIDAB in the last 72 hours. Anemia Panel: No results for input(s): VITAMINB12, FOLATE, FERRITIN, TIBC, IRON, RETICCTPCT in the last 72 hours. Urine analysis:    Component Value Date/Time   COLORURINE YELLOW 04/06/2020 [place] 04/06/2020 1533   LABSPEC 1.015 04/06/2020 1533   PHURINE 5.0 04/06/2020 1533   GLUCOSEU NEGATIVE 04/06/2020 1533   [place] 04/06/2020 1533   [place] 04/06/2020 1533   KETONESUR 20 (A) 04/06/2020 1533   PROTEINUR NEGATIVE 04/06/2020 1533   UROBILINOGEN 1.0 01/09/2015 1620   NITRITE NEGATIVE 04/06/2020 1533    LEUKOCYTESUR NEGATIVE 04/06/2020 1533    Radiological Exams on Admission: DG Chest Portable 1 View  Result Date: 04/07/2020 CLINICAL DATA:  Cough EXAM: PORTABLE CHEST 1 VIEW COMPARISON:  05/10/2019 FINDINGS: Cardiac pacemaker. Heart size and pulmonary vascularity are normal. Probable linear atelectasis in the lung bases. No airspace disease or consolidation. No pleural effusions. No pneumothorax. Degenerative changes in the spine and shoulders. IMPRESSION: Probable linear atelectasis in the lung bases. Electronically Signed   By: Lucienne Capers M.D.   On: 04/07/2020 01:23    EKG: Independently reviewed.  Assessment/Plan Principal Problem:   AKI (acute kidney injury) ([place]) Active Problems:   Diabetes mellitus with complication (HCC)   Acute encephalopathy   Dementia (HCC)   CKD (chronic kidney disease) stage 3, GFR 30-59 ml/min    1. AKI on CKD 3 - 1. Could be pre-renal due to dehydration and poor PO intake, alternatively could be urinary retention with the bladder scan of 480 and no feeling of needing to void. 2. RN to place foley 3. IVF: 500cc bolus and NS at 100 4. Strict intake and output 5. Repeat BMP in AM 2. Acute metabolic encephalopathy - on top of chronic dementia 1. Increased somnolence probably just due to AKI 2. DDx includes COVID with URI symptoms for past week+ and multiple sick contacts 3. COVID pending 4. TSH pending 5. LFTs pending 6. Check ammonia 7. Granddaughter also interested in pal care - may wish to get consult in AM 3. DM2 - 1. Hold metformin 2. Sensitive SSI AC/HS  DVT prophylaxis: Lovenox Code Status: Full code for now Family Communication: No family in room Disposition Plan: TBD Consults called: None Admission status: Place in 69    Heiley Shaikh, [facility]  How to contact the [facility] Attending or Consulting provider [place] or covering provider during after hours [place], for this patient?  1. Check the care team in [facility] and  look for a) attending/consulting TR[facility] provider listed and b) the [facility] team listed 2. Log into [email or web address]  Amion Physician Scheduling and messaging for groups and whole hospitals  On call and physician scheduling software for group practices, residents, hospitalists and other medical providers for call, clinic, rotation and shift schedules. OnCall Enterprise is a hospital-wide system for scheduling doctors and paging doctors on call. EasyPlot is for scientific plotting and data analysis.  [email or web address]  and use [place]'s universal  password to access. If you do not have the password, please contact the hospital operator.  3. Locate the [facility] provider you are looking for under [facility] and page to a number that you can be directly reached. 4. If you still have difficulty reaching the provider, please page the [facility] (Director on Call) for the Hospitalists listed on amion for assistance.  04/07/2020, 3:06 AM

## 2020-04-07 NOTE — Plan of Care (Signed)
  Problem: Health Behavior/Discharge Planning: Goal: Ability to manage health-related needs will improve Outcome: Progressing   Problem: Clinical Measurements: Goal: Will remain free from infection Outcome: Progressing Goal: Diagnostic test results will improve Outcome: Progressing Goal: Respiratory complications will improve Outcome: Progressing   Problem: Activity: Goal: Risk for activity intolerance will decrease Outcome: Progressing   Problem: Education: Goal: Knowledge of risk factors and measures for prevention of condition will improve Outcome: Progressing   Problem: Coping: Goal: Psychosocial and spiritual needs will be supported Outcome: Progressing   Problem: Respiratory: Goal: Will maintain a patent airway Outcome: Progressing

## 2020-04-07 NOTE — Progress Notes (Signed)
Able to notify granddaughter re patient status. Patient history given by granddaughter [name].

## 2020-04-08 DIAGNOSIS — U071 COVID-19: Secondary | ICD-10-CM

## 2020-04-08 LAB — COMPREHENSIVE METABOLIC PANEL
ALT: 8 U/L (ref 0–44)
AST: 18 U/L (ref 15–41)
Albumin: 2.9 g/dL — ABNORMAL LOW (ref 3.5–5.0)
Alkaline Phosphatase: 45 U/L (ref 38–126)
Anion gap: 8 (ref 5–15)
BUN: 32 mg/dL — ABNORMAL HIGH (ref 8–23)
CO2: 22 mmol/L (ref 22–32)
Calcium: 8.6 mg/dL — ABNORMAL LOW (ref 8.9–10.3)
Chloride: 114 mmol/L — ABNORMAL HIGH (ref 98–111)
Creatinine, Ser: 1.57 mg/dL — ABNORMAL HIGH (ref 0.44–1.00)
GFR, Estimated: 32 mL/min — ABNORMAL LOW (ref >60)
Glucose, Bld: 178 mg/dL — ABNORMAL HIGH (ref 70–99)
Potassium: 4.3 mmol/L (ref 3.5–5.1)
Sodium: 144 mmol/L (ref 135–145)
Total Bilirubin: 0.6 mg/dL (ref 0.3–1.2)
Total Protein: 5.6 g/dL — ABNORMAL LOW (ref 6.5–8.1)

## 2020-04-08 LAB — CBC WITH DIFFERENTIAL/PLATELET
Abs Immature Granulocytes: 0.01 10*3/uL (ref 0.00–0.07)
Basophils Absolute: 0 10*3/uL (ref 0.0–0.1)
Basophils Relative: 1 %
Eosinophils Absolute: 0.1 10*3/uL (ref 0.0–0.5)
Eosinophils Relative: 4 %
HCT: 31.5 % — ABNORMAL LOW (ref 36.0–46.0)
Hemoglobin: 9.8 g/dL — ABNORMAL LOW (ref 12.0–15.0)
Immature Granulocytes: 0 %
Lymphocytes Relative: 40 %
Lymphs Abs: 1.1 10*3/uL (ref 0.7–4.0)
MCH: 30.9 pg (ref 26.0–34.0)
MCHC: 31.1 g/dL (ref 30.0–36.0)
MCV: 99.4 fL (ref 80.0–100.0)
Monocytes Absolute: 0.5 10*3/uL (ref 0.1–1.0)
Monocytes Relative: 18 %
Neutro Abs: 1 10*3/uL — ABNORMAL LOW (ref 1.7–7.7)
Neutrophils Relative %: 37 %
Platelets: 134 10*3/uL — ABNORMAL LOW (ref 150–400)
RBC: 3.17 MIL/uL — ABNORMAL LOW (ref 3.87–5.11)
RDW: 12.5 % (ref 11.5–15.5)
WBC: 2.8 10*3/uL — ABNORMAL LOW (ref 4.0–10.5)
nRBC: 0 % (ref 0.0–0.2)

## 2020-04-08 LAB — D-DIMER, QUANTITATIVE: D-Dimer, Quant: 7.13 ug/mL-FEU — ABNORMAL HIGH (ref 0.00–0.50)

## 2020-04-08 LAB — URINE CULTURE: Culture: NO GROWTH

## 2020-04-08 LAB — GLUCOSE, CAPILLARY
Glucose-Capillary: 135 mg/dL — ABNORMAL HIGH (ref 70–99)
Glucose-Capillary: 173 mg/dL — ABNORMAL HIGH (ref 70–99)
Glucose-Capillary: 297 mg/dL — ABNORMAL HIGH (ref 70–99)
Glucose-Capillary: 242 mg/dL — ABNORMAL HIGH (ref 70–99)

## 2020-04-08 LAB — C-REACTIVE PROTEIN: CRP: 2.4 mg/dL — ABNORMAL HIGH (ref <1.0)

## 2020-04-08 MED ORDER — ADULT MULTIVITAMIN W/MINERALS CH
1.0000 | ORAL_TABLET | Freq: Every day | ORAL | Status: DC
  Administered 2020-04-08 – 2020-04-11 (×4): 1 via ORAL
  Filled 2020-04-08 (×4): qty 1

## 2020-04-08 NOTE — Evaluation (Signed)
Physical Therapy Evaluation Patient Details Name: [name] MRN: [id number] DOB: 09-Feb-1934 Today's Date: 04/08/2020   History of Present Illness  Pt is a 87 y.o. female with a history of CKD stage III, dementia, chronic diastolic heart failure, colostomy. Patient presented secondary to decreased urine output and mental status change and found to have dehydration and AKI from poor oral intake. COVID-19 positive.    Clinical Impression  Pt admitted with above diagnosis. Pt with hx of dementia and unable to provide reliable PLOF or home environment.  Per prior visits pt had 24 hr support and could ambulate with RW but required assist with ADL.  Today she required min A for transfers and to ambulate short distance with stable vitals on RA.  Pt currently with functional limitations due to the deficits listed below (see PT Problem List). Pt will benefit from skilled PT to increase their independence and safety with mobility to allow discharge to the venue listed below.  Given shortage of SNF beds for pt's with COVID, pt could possibly progress to home level with 24 hr care and HHPT/max HH services.      Follow Up Recommendations SNF (potential to progress to home w 24 hr care HHPT)    Equipment Recommendations  Rolling walker with 5" wheels    Recommendations for Other Services       Precautions / Restrictions Precautions Precautions: Fall Restrictions Weight Bearing Restrictions: No      Mobility  Bed Mobility               General bed mobility comments: in chair    Transfers Overall transfer level: Needs assistance Equipment used: 1 person hand held assist Transfers: Sit to/from Stand Sit to Stand: Min assist            Ambulation/Gait Ambulation/Gait assistance: Min assist Gait Distance (Feet): 8 Feet Assistive device: 1 person hand held assist (and counter top) Gait Pattern/deviations: Step-to pattern;Shuffle;Decreased stride length Gait velocity:  decreased   General Gait Details: No RW in room.  Pt ambulated forward and back 4' with HHA and use of counter top.  She fatigued easily.  Required min A for balance with tendency to lean posteriorly  Stairs            Wheelchair Mobility    Modified Rankin (Stroke Patients Only)       Balance Overall balance assessment: Needs assistance Sitting-balance support: No upper extremity supported Sitting balance-Leahy Scale: Fair     Standing balance support: Bilateral upper extremity supported Standing balance-Leahy Scale: Poor Standing balance comment: requiring min A with tendency to posterior lean                             Pertinent Vitals/Pain Pain Assessment: No/denies pain    Home Living Family/patient expects to be discharged to:: Private residence                 Additional Comments: Pt with dementia and no family present.  She is from home.  Per prior visit she has RW and 24 hr care (unsure if this is still accurate)    Prior Function Level of Independence: Needs assistance         Comments: Pt with dementia and no family present. She reports uses a RW.  Per prior visit, pt's Granddaughter assisted with ADLs.     Hand Dominance   Dominant Hand: Right    Extremity/Trunk Assessment  Upper Extremity Assessment Upper Extremity Assessment: Generalized weakness    Lower Extremity Assessment Lower Extremity Assessment: LLE deficits/detail;RLE deficits/detail RLE Deficits / Details: ROM WFL; MMT 4+/5 LLE Deficits / Details: ROM WFL; MMT 4+/5    Cervical / Trunk Assessment Cervical / Trunk Assessment: Kyphotic  Communication   Communication: No difficulties  Cognition Arousal/Alertness: Awake/alert Behavior During Therapy: WFL for tasks assessed/performed Overall Cognitive Status: No family/caregiver present to determine baseline cognitive functioning Area of Impairment: Orientation;Following  commands;Safety/judgement;Memory;Problem solving                 Orientation Level: Disoriented to;Time;Situation   Memory: Decreased short-term memory Following Commands: Follows one step commands consistently Safety/Judgement: Decreased awareness of safety   Problem Solving: Slow processing;Decreased initiation;Difficulty sequencing;Requires verbal cues;Requires tactile cues General Comments: Pt with hx of dementia. She was able to follow commands and aware she was in hospital.      General Comments General comments (skin integrity, edema, etc.): Pt on RA with O2 sats 95%    Exercises     Assessment/Plan    PT Assessment Patient needs continued PT services  PT Problem List Decreased strength;Decreased mobility;Decreased safety awareness;Decreased range of motion;Decreased knowledge of precautions;Decreased activity tolerance;Decreased cognition;Decreased balance;Cardiopulmonary status limiting activity;Decreased knowledge of use of DME       PT Treatment Interventions DME instruction;Therapeutic activities;Gait training;Therapeutic exercise;Patient/family education;Balance training;Functional mobility training    PT Goals (Current goals can be found in the Care Plan section)  Acute Rehab PT Goals Patient Stated Goal: unable PT Goal Formulation: Patient unable to participate in goal setting Time For Goal Achievement: 04/22/20 Potential to Achieve Goals: Good    Frequency Min 3X/week   Barriers to discharge        Co-evaluation               AM-PAC PT "6 Clicks" Mobility  Outcome Measure Help needed turning from your back to your side while in a flat bed without using bedrails?: A Little Help needed moving from lying on your back to sitting on the side of a flat bed without using bedrails?: A Little Help needed moving to and from a bed to a chair (including a wheelchair)?: A Little Help needed standing up from a chair using your arms (e.g., wheelchair or  bedside chair)?: A Little Help needed to walk in hospital room?: A Little Help needed climbing 3-5 steps with a railing? : A Lot 6 Click Score: 17    End of Session Equipment Utilized During Treatment: Gait belt Activity Tolerance: Patient tolerated treatment well Patient left: with chair alarm set;in chair;with call bell/phone within reach;with nursing/sitter in room Nurse Communication: Mobility status PT Visit Diagnosis: Unsteadiness on feet (R26.81);Muscle weakness (generalized) (M62.81)    Time: [id number] PT Time Calculation (min) (ACUTE ONLY): 19 min   Charges:   PT Evaluation $PT Eval Low Complexity: 1 Low          [place], PT Acute Rehab Services Pager [phone] [facility] Rehab [phone]    Karlton Lemon 04/08/2020, 5:43 PM

## 2020-04-08 NOTE — Progress Notes (Signed)
PROGRESS NOTE    [name]  [id number] DOB: 18-Nov-1933 DOA: 04/06/2020 PCP: Leeroy Cha, MD   Brief Narrative: [name] is a 87 y.o. female with a history of CKD stage III, dementia, chronic diastolic heart failure, colostomy. Patient presented secondary to decreased urine output and mental status change and found to have dehydration and AKI from poor oral intake. COVID-19 positive. Started on IV fluids.   Assessment & Plan:   Principal Problem:   AKI (acute kidney injury) ([place]) Active Problems:   Diabetes mellitus with complication ([place])   Acute encephalopathy   Dementia (HCC)   COPD (chronic obstructive pulmonary disease) (HCC)   CKD (chronic kidney disease) stage 3, GFR 30-59 ml/min   COVID-19 virus infection   AKI on CKD stage IIIb Baseline appears to be around 1.7-1.8. Creatinine of 2.47 on admission. Started on IV fluids with improving creatinine and is now below baseline. -Discontinue IV fluids  Acute metabolic encephalopathy Secondary to dehydration. Seems to have returned to baseline with IV fluid resuscitation.  Decreased urine output Likely secondary to dehydration from decreased oral hydration related to dementia. -Strict in/out  COVID-19 infection Patient is unvaccinated. No symptoms. No pneumonia on chest x-ray. No hypoxia. -Daily CMP, CBC, D-dimer, CRP  Dementia It seems patient is appearing end-stage as patient has decreased desire to eat and drink. Discussed this with granddaughter who understands progression of dementia. Will likely qualify for home with hospice. -Palliative care consult -Continue Namenda, Buspar, Aricept  Diabetes mellitus, type 2 Patient is on metformin as an outpatient. Hemoglobin A1C of 5.7% this admission. -Continue SSI -May need to discontinue metformin on discharge secondary to kidney function  Metabolic acidosis High anion gap. Secondary to AKI. Resolved.  Chronic diastolic heart  failure Stable.  COPD -Continue Pulmicort  Hyperlipidemia -Continue pravastatin  History of symptomatic bradycardia S/p pacemaker.  S/p colostomy WOC consulted.   DVT prophylaxis: Heparin subq Code Status:   Code Status: Full Code Family Communication: Granddaughter on telephone (8 minutes) Disposition Plan: Discharge home possible with home hospice. Anticipate discharge in 24 hours pending PT/OT recommendations, palliative care recommendations, no worsening symptoms from COVID-19 infection   Consultants:   Palliative care medicine  Procedures:   None  Antimicrobials:  None    Subjective: No concerns.   Objective: Vitals:   04/07/20 1917 04/07/20 2337 04/08/20 0314 04/08/20 1352  BP: 131/65 128/66 131/62 128/61  Pulse: 60 60 61 71  Resp: '20 16 16 16  '$ Temp: 99.2 F (37.3 C) 98.9 F (37.2 C) 99.1 F (37.3 C) 98.8 F (37.1 C)  TempSrc: Oral Oral Oral Oral  SpO2: 100% 100% 96% 100%  Weight:      Height:        Intake/Output Summary (Last 24 hours) at 04/08/2020 1432 Last data filed at 04/08/2020 1356 Gross per 24 hour  Intake 1179.67 ml  Output 1750 ml  Net -570.33 ml   Filed Weights   04/06/20 1528 04/07/20 1502  Weight: 58.5 kg 54.1 kg    Examination:  General exam: Appears calm and comfortable Respiratory system: Clear to auscultation. Respiratory effort normal. Cardiovascular system: S1 & S2 heard, RRR. No murmurs, rubs, gallops or clicks. Gastrointestinal system: Abdomen is nondistended, soft and nontender. No organomegaly or masses felt. Normal bowel sounds heard. Central nervous system: Alert and oriented to person, place and year. No focal neurological deficits. Musculoskeletal: No edema. No calf tenderness Skin: No cyanosis. No rashes Psychiatry: Judgement and insight appear normal. Mood & affect  appropriate.     Data Reviewed: I have personally reviewed following labs and imaging studies  CBC Lab Results  Component Value Date    WBC 2.8 (L) 04/08/2020   RBC 3.17 (L) 04/08/2020   HGB 9.8 (L) 04/08/2020   HCT 31.5 (L) 04/08/2020   MCV 99.4 04/08/2020   MCH 30.9 04/08/2020   PLT 134 (L) 04/08/2020   MCHC 31.1 04/08/2020   RDW 12.5 04/08/2020   LYMPHSABS 1.1 04/08/2020   MONOABS 0.5 04/08/2020   EOSABS 0.1 04/08/2020   BASOSABS 0.0 0000000     Last metabolic panel Lab Results  Component Value Date   NA 144 04/08/2020   K 4.3 04/08/2020   CL 114 (H) 04/08/2020   CO2 22 04/08/2020   BUN 32 (H) 04/08/2020   CREATININE 1.57 (H) 04/08/2020   GLUCOSE 178 (H) 04/08/2020   GFRNONAA 32 (L) 04/08/2020   GFRAA 26 (L) 06/08/2019   CALCIUM 8.6 (L) 04/08/2020   PROT 5.6 (L) 04/08/2020   ALBUMIN 2.9 (L) 04/08/2020   BILITOT 0.6 04/08/2020   ALKPHOS 45 04/08/2020   AST 18 04/08/2020   ALT 8 04/08/2020   ANIONGAP 8 04/08/2020    CBG (last 3)  Recent Labs    04/07/20 1919 04/08/20 0815 04/08/20 1131  GLUCAP 130* 135* 173*     GFR: Estimated Creatinine Clearance: 22.8 mL/min (A) (by C-G formula based on SCr of 1.57 mg/dL (H)).  Coagulation Profile: No results for input(s): INR, PROTIME in the last 168 hours.  Recent Results (from the past 240 hour(s))  Urine Culture     Status: None   Collection Time: 04/06/20  9:36 PM   Specimen: Urine, Random  Result Value Ref Range Status   Specimen Description   Final    URINE, RANDOM Performed at [facility] [address], [place], [place] [address]    Special Requests   Final    NONE Performed at [facility], [place] [address] [place], [place] [address]    Culture   Final    NO GROWTH Performed at [facility], [place] [address], [place], [place] [address]    Report Status 04/08/2020 FINAL  Final  SARS CORONAVIRUS 2 (TAT 6-24 HRS) Nasopharyngeal Nasopharyngeal Swab     Status: Abnormal   Collection Time: 04/07/20  1:04 AM   Specimen: Nasopharyngeal Swab  Result Value Ref Range Status   SARS Coronavirus 2  POSITIVE (A) NEGATIVE Final    Comment: (NOTE) SARS-CoV-2 target nucleic acids are DETECTED.  The SARS-CoV-2 RNA is generally detectable in upper and lower respiratory specimens during the acute phase of infection. Positive results are indicative of the presence of SARS-CoV-2 RNA. Clinical correlation with patient history and other diagnostic information is  necessary to determine patient infection status. Positive results do not rule out bacterial infection or co-infection with other viruses.  The expected result is Negative.  Fact Sheet for Patients: SugarRoll.be  Fact Sheet for Healthcare Providers: https://www.woods-mathews.com/  This test is not yet approved or cleared by the [place] FDA and  has been authorized for detection and/or diagnosis of SARS-CoV-2 by FDA under an Emergency Use Authorization (EUA). This EUA will remain  in effect (meaning this test can be used) for the duration of the COVID-19 declaration under Section 564(b)(1) of the Act, 21 U. S.C. section 360bbb-3(b)(1), unless the authorization is terminated or revoked sooner.   Performed at [facility], [place] [address], [place], [place]  [id number]         Radiology Studies: DG Chest Portable 1 View  Result Date: 04/07/2020 CLINICAL DATA:  Cough EXAM: PORTABLE CHEST 1 VIEW COMPARISON:  05/10/2019 FINDINGS: Cardiac pacemaker. Heart size and pulmonary vascularity are normal. Probable linear atelectasis in the lung bases. No airspace disease or consolidation. No pleural effusions. No pneumothorax. Degenerative changes in the spine and shoulders. IMPRESSION: Probable linear atelectasis in the lung bases. Electronically Signed   By: Lucienne Capers M.D.   On: 04/07/2020 01:23        Scheduled Meds: . budesonide (PULMICORT) nebulizer solution  0.5 mg Nebulization BID  . busPIRone  5 mg Oral TID  . calcium-vitamin D  1 tablet Oral q morning - 10a  .  Chlorhexidine Gluconate Cloth  6 each Topical Daily  . donepezil  10 mg Oral QHS  . escitalopram  10 mg Oral QPM  . feeding supplement  237 mL Oral BID BM  . heparin  5,000 Units Subcutaneous Q8H  . insulin aspart  0-5 Units Subcutaneous QHS  . insulin aspart  0-9 Units Subcutaneous TID WC  . memantine  28 mg Oral q morning - 10a  . montelukast  10 mg Oral QHS  . pravastatin  10 mg Oral QHS   Continuous Infusions: . sodium chloride 100 mL/hr at 04/08/20 1237     LOS: 1 day     Cordelia Poche, MD [facility] 04/08/2020, 2:32 PM  If 7PM-7AM, please contact night-coverage [email or web address]

## 2020-04-08 NOTE — Progress Notes (Signed)
Initial Nutrition Assessment  INTERVENTION:   -Ensure Enlive po BID, each supplement provides 350 kcal and 20 grams of protein  -Magic cup BID with meals, each supplement provides 290 kcal and 9 grams of protein  -Multivitamin with minerals daily  NUTRITION DIAGNOSIS:   Inadequate oral intake related to chronic illness,poor appetite (dementia) as evidenced by per patient/family report.  GOAL:   Patient will meet greater than or equal to 90% of their needs  MONITOR:   PO intake,Supplement acceptance,Labs,Weight trends,I & O's  REASON FOR ASSESSMENT:   Malnutrition Screening Tool    ASSESSMENT:   87 y.o. female with a history of CKD stage III, dementia, chronic diastolic heart failure, colostomy. Patient presented secondary to decreased urine output and mental status change and found to have dehydration and AKI from poor oral intake. COVID-19 positive.  Patient with poor appetite d/t mental status and increased lethargy r/t dementia. Pt refused dinner last night but did drink 1/2 of a Ensure. Ensure supplements ordered and will continue. No further PO documented. Will order Magic Cups with meals as well.   Noted that Palliative care has been consulted for [place].  Per weight records, pt has lost 31 lbs since 05/11/19 (20% wt loss x 11 months, significant for time frame).   Medications: OSCAL-D  Labs reviewed: CBGs: 130-173  NUTRITION - FOCUSED PHYSICAL EXAM:  Deferred.  Diet Order:   Diet Order            Diet Carb Modified Fluid consistency: Thin; Room service appropriate? Yes  Diet effective now                 EDUCATION NEEDS:   No education needs have been identified at this time  Skin:  Skin Assessment: Reviewed RN Assessment  Last BM:  PTA  Height:   Ht Readings from Last 1 Encounters:  04/07/20 '5\' 4"'$  (1.626 m)    Weight:   Wt Readings from Last 1 Encounters:  04/07/20 54.1 kg   BMI:  Body mass index is 20.48 kg/m.  Estimated Nutritional  Needs:   Kcal:  1450-1650  Protein:  65-80g  Fluid:  1.7L/day  Clayton Bibles, MS, RD, LDN Inpatient Clinical Dietitian Contact information available via Amion

## 2020-04-08 NOTE — Progress Notes (Signed)
Pt has COVID  And isnt able to have a nebulizer

## 2020-04-09 DIAGNOSIS — Z7189 Other specified counseling: Secondary | ICD-10-CM

## 2020-04-09 DIAGNOSIS — Z515 Encounter for palliative care: Secondary | ICD-10-CM

## 2020-04-09 LAB — GLUCOSE, CAPILLARY
Glucose-Capillary: 120 mg/dL — ABNORMAL HIGH (ref 70–99)
Glucose-Capillary: 132 mg/dL — ABNORMAL HIGH (ref 70–99)
Glucose-Capillary: 161 mg/dL — ABNORMAL HIGH (ref 70–99)
Glucose-Capillary: 105 mg/dL — ABNORMAL HIGH (ref 70–99)

## 2020-04-09 LAB — CBC WITH DIFFERENTIAL/PLATELET
Abs Immature Granulocytes: 0.05 10*3/uL (ref 0.00–0.07)
Basophils Absolute: 0 10*3/uL (ref 0.0–0.1)
Basophils Relative: 1 %
Eosinophils Absolute: 0.1 10*3/uL (ref 0.0–0.5)
Eosinophils Relative: 3 %
HCT: 32.9 % — ABNORMAL LOW (ref 36.0–46.0)
Hemoglobin: 10.2 g/dL — ABNORMAL LOW (ref 12.0–15.0)
Immature Granulocytes: 1 %
Lymphocytes Relative: 36 %
Lymphs Abs: 1.5 10*3/uL (ref 0.7–4.0)
MCH: 30.4 pg (ref 26.0–34.0)
MCHC: 31 g/dL (ref 30.0–36.0)
MCV: 98.2 fL (ref 80.0–100.0)
Monocytes Absolute: 0.6 10*3/uL (ref 0.1–1.0)
Monocytes Relative: 14 %
Neutro Abs: 1.9 10*3/uL (ref 1.7–7.7)
Neutrophils Relative %: 45 %
Platelets: 152 10*3/uL (ref 150–400)
RBC: 3.35 MIL/uL — ABNORMAL LOW (ref 3.87–5.11)
RDW: 12.3 % (ref 11.5–15.5)
WBC: 4.2 10*3/uL (ref 4.0–10.5)
nRBC: 0 % (ref 0.0–0.2)

## 2020-04-09 LAB — COMPREHENSIVE METABOLIC PANEL
ALT: 9 U/L (ref 0–44)
AST: 20 U/L (ref 15–41)
Albumin: 3.1 g/dL — ABNORMAL LOW (ref 3.5–5.0)
Alkaline Phosphatase: 49 U/L (ref 38–126)
Anion gap: 10 (ref 5–15)
BUN: 28 mg/dL — ABNORMAL HIGH (ref 8–23)
CO2: 19 mmol/L — ABNORMAL LOW (ref 22–32)
Calcium: 8.7 mg/dL — ABNORMAL LOW (ref 8.9–10.3)
Chloride: 113 mmol/L — ABNORMAL HIGH (ref 98–111)
Creatinine, Ser: 1.39 mg/dL — ABNORMAL HIGH (ref 0.44–1.00)
GFR, Estimated: 37 mL/min — ABNORMAL LOW (ref >60)
Glucose, Bld: 128 mg/dL — ABNORMAL HIGH (ref 70–99)
Potassium: 4.4 mmol/L (ref 3.5–5.1)
Sodium: 142 mmol/L (ref 135–145)
Total Bilirubin: 0.3 mg/dL (ref 0.3–1.2)
Total Protein: 6.1 g/dL — ABNORMAL LOW (ref 6.5–8.1)

## 2020-04-09 LAB — C-REACTIVE PROTEIN: CRP: 1 mg/dL — ABNORMAL HIGH (ref <1.0)

## 2020-04-09 LAB — D-DIMER, QUANTITATIVE: D-Dimer, Quant: 5.08 ug/mL-FEU — ABNORMAL HIGH (ref 0.00–0.50)

## 2020-04-09 NOTE — NC FL2 (Signed)
[place] MEDICAID FL2 LEVEL OF CARE SCREENING TOOL     IDENTIFICATION  Patient Name: [name] Birthdate: 1934/02/19 Sex: female Admission Date (Current Location): 04/06/2020  [facility] and [place] Number:  Herbalist and Address:  [facility],  [place], [place]      Provider Number: [id number]  Attending Physician Name and Address:  Mariel Aloe, MD  Relative Name and Phone Number:  Annalea, Gladney (Granddaughter)   [phone]    Current Level of Care: Hospital Recommended Level of Care: [place] Prior Approval Number:    Date Approved/Denied:   PASRR Number: [id number]  Discharge Plan: SNF    Current Diagnoses: Patient Active Problem List   Diagnosis Date Noted  . COVID-19 virus infection 04/07/2020  . Pacemaker 12/21/2019  . UTI (urinary tract infection) 05/11/2019  . ARF (acute renal failure) ([place]) 05/10/2019  . Acute lower UTI 05/10/2019  . Symptomatic bradycardia 05/01/2018  . Near syncope 05/01/2018  . CKD (chronic kidney disease) stage 3, GFR 30-59 ml/min 05/01/2018  . Influenza B 04/10/2018  . Generalized weakness 04/10/2018  . Essential hypertension 03/01/2018  . COPD (chronic obstructive pulmonary disease) ([place]) 03/01/2018  . Bradycardia 12/15/2017  . AKI (acute kidney injury) ([place]) 07/18/2017  . Colostomy present ([place]) 11/08/2016  . Syncope 10/08/2016  . Acute kidney injury ([place]) 10/08/2016  . Abnormal chest x-ray 10/08/2016  . Lethargy 10/08/2016  . Dementia ([place])   . Sepsis ([place]) 09/05/2016  . Community acquired pneumonia 09/05/2016  . Acute encephalopathy 09/05/2016  . Diabetes mellitus with complication ([place]) [id number]  . Nausea & vomiting 04/09/2012  . Gallstones 03/15/2011  . Partial SBO 03/15/2011  . Colostomy hernia ([place]) 03/15/2011  . PSEUDOGOUT 02/28/2007  . IRON DEFIC ANEMIA [place] DIET IRON INTAKE 02/28/2007  . DISCITIS 02/28/2007  . OSTEOMYELITIS 02/28/2007  .  CHILLS WITHOUT FEVER 02/28/2007    Orientation RESPIRATION BLADDER Height & Weight     Self,Place  Normal Indwelling catheter Weight: 54.1 kg Height:  '5\' 4"'$  (162.6 cm) (estimate by staff)  BEHAVIORAL SYMPTOMS/MOOD NEUROLOGICAL BOWEL NUTRITION STATUS   (none)  (none) Colostomy Diet (see d/c summary)  AMBULATORY STATUS COMMUNICATION OF NEEDS Skin   Extensive Assist Verbally Normal                       Personal Care Assistance Level of Assistance  Bathing,Feeding,Dressing Bathing Assistance: Maximum assistance Feeding assistance: Independent Dressing Assistance: Limited assistance     Functional Limitations Info  Sight,Hearing,Speech Sight Info: Adequate Hearing Info: Adequate Speech Info: Adequate    SPECIAL CARE FACTORS FREQUENCY  PT (By licensed PT),OT (By licensed OT)     PT Frequency: 5X/W OT Frequency: 5X/W            Contractures Contractures Info: Not present    Additional Factors Info  Code Status,Allergies Code Status Info: full Allergies Info: Aspirin           Current Medications (04/09/2020):  This is the current hospital active medication list Current Facility-Administered Medications  Medication Dose Route Frequency Provider Last Rate Last Admin  . acetaminophen (TYLENOL) tablet 650 mg  650 mg Oral Q6H PRN Etta Quill, DO       Or  . acetaminophen (TYLENOL) suppository 650 mg  650 mg Rectal Q6H PRN Etta Quill, DO      . albuterol (VENTOLIN HFA) 108 (90 Base) MCG/ACT inhaler 1-2 puff  1-2 puff Inhalation Q4H PRN Jennette Kettle  M, DO      . budesonide (PULMICORT) nebulizer solution 0.5 mg  0.5 mg Nebulization BID Jennette Kettle M, DO   0.5 mg at 04/07/20 [id number]  . busPIRone (BUSPAR) tablet 5 mg  5 mg Oral TID Etta Quill, DO   5 mg at 04/09/20 0957  . calcium-vitamin D (OSCAL WITH D) 500-200 MG-UNIT per tablet 1 tablet  1 tablet Oral q morning - 10a Etta Quill, DO   1 tablet at 04/09/20 [phone]  . Chlorhexidine Gluconate Cloth  2 % PADS 6 each  6 each Topical Daily Mariel Aloe, MD   6 each at 04/09/20 [phone]  . donepezil (ARICEPT) tablet 10 mg  10 mg Oral QHS Jennette Kettle M, DO   10 mg at 04/08/20 2136  . escitalopram (LEXAPRO) tablet 10 mg  10 mg Oral QPM Jennette Kettle M, DO   10 mg at 04/08/20 1801  . feeding supplement (ENSURE ENLIVE / ENSURE PLUS) liquid 237 mL  237 mL Oral BID BM Mariel Aloe, MD   237 mL at 04/09/20 1501  . heparin injection 5,000 Units  5,000 Units Subcutaneous Q8H Jennette Kettle M, DO   5,000 Units at 04/09/20 1501  . insulin aspart (novoLOG) injection 0-5 Units  0-5 Units Subcutaneous QHS Etta Quill, DO   2 Units at 04/08/20 2145  . insulin aspart (novoLOG) injection 0-9 Units  0-9 Units Subcutaneous TID WC Etta Quill, DO   1 Units at 04/09/20 1203  . memantine (NAMENDA XR) 24 hr capsule 28 mg  28 mg Oral q morning - 10a Etta Quill, DO   28 mg at 04/09/20 0957  . montelukast (SINGULAIR) tablet 10 mg  10 mg Oral QHS Jennette Kettle M, DO   10 mg at 04/08/20 2136  . multivitamin with minerals tablet 1 tablet  1 tablet Oral Daily Mariel Aloe, MD   1 tablet at 04/09/20 [phone]  . ondansetron (ZOFRAN) tablet 4 mg  4 mg Oral Q6H PRN Etta Quill, DO       Or  . ondansetron [facility]) injection 4 mg  4 mg Intravenous Q6H PRN Etta Quill, DO      . pravastatin (PRAVACHOL) tablet 10 mg  10 mg Oral QHS Jennette Kettle M, DO   10 mg at 04/08/20 2136     Discharge Medications: Please see discharge summary for a list of discharge medications.  Relevant Imaging Results:  Relevant Lab Results:   Additional Information [place], [place]

## 2020-04-09 NOTE — TOC Initial Note (Addendum)
Transition of Care [facility]) - Initial/Assessment Note    Patient Details  Name: [name] MRN: [id number] Date of Birth: 1933/10/09  Transition of Care [facility]) CM/SW Contact:    Trish Mage, LCSW Phone Number: 04/09/2020, 11:21 AM  Clinical Narrative:   Spoke to grandaughter who cares for patient; she had multiple concerns about patient's return home.  She states that patient, who generally spends most of her day in her room, does not have room to use a walker in the room, so is concerned that patient in weakened state is high risk for fall when going to bathroom.  She also has several young children in home and is worried about contagion, wonders how long patient will need to be on precautions. Furthermore, explains that she has multiple medical issues and feels she is at high risk for complications from [place].  I explained about lack of available beds at rehab and asked her about a plan B.  She stated there are one or two family members to whom she can reach out to about taking in patient temporarily, but feels it is a long shot that someone will be willing to help out.  I agreed to send patient's information out to rehab in an attempt to find a bed, but again emphasized that when patient is medically stable, she will need to come home with support of [facility] services if no bed is available.  She voiced understanding.  Bed search initiated. TOC will continue to follow during the course of hospitalization.  Addendum:  Grandaughter states she does not have transportation and has to rely on others for same.                 Expected Discharge Plan: Skilled Nursing Facility Barriers to Discharge: SNF Covid   Patient Goals and CMS Choice     Choice offered to / list presented to : Adult Children  Expected Discharge Plan and Services Expected Discharge Plan: [place]   Discharge Planning Services: CM Consult Post Acute Care Choice: [place]  arrangements for the past 2 months: Single Family Home                                      Prior Living Arrangements/Services Living arrangements for the past 2 months: Single Family Home Lives with:: Relatives (grand daughter and her children) Patient language and need for interpreter reviewed:: Yes        Need for Family Participation in Patient Care: Yes (Comment) Care giver support system in place?: Yes (comment) Current home services: DME Criminal Activity/Legal Involvement Pertinent to Current Situation/Hospitalization: No - Comment as needed  Activities of Daily Living Home Assistive Devices/Equipment: [place] Rile (specify type) ADL Screening (condition at time of admission) Patient's cognitive ability adequate to safely complete daily activities?: Yes Is the patient deaf or have difficulty hearing?: No Does the patient have difficulty seeing, even when wearing glasses/contacts?: No Does the patient have difficulty concentrating, remembering, or making decisions?: Yes Patient able to express need for assistance with ADLs?: Yes Does the patient have difficulty dressing or bathing?: No Independently performs ADLs?: Yes (appropriate for developmental age) Communication: Independent Dressing (OT): Independent Grooming: Independent Feeding: Independent Bathing: Needs assistance Toileting: Appropriate for developmental age In/Out Bed: Needs assistance Does the patient have difficulty walking or climbing stairs?: Yes Weakness of Legs: Both Weakness of Arms/Hands: None  Permission Sought/Granted  Permission sought to share information with : Family Supports Permission granted to share information with : No  Share Information with NAME: Londen, Konkel (Granddaughter)   [phone]           Emotional Assessment       Orientation: : Oriented to Self,Oriented to Situation Alcohol / Substance Use: Not Applicable Psych Involvement: No (comment)  Admission diagnosis:   Cough [R05.9] Dehydration [E86.0] Urinary retention [R33.9] AKI (acute kidney injury) ([place]) [N17.9] Patient Active Problem List   Diagnosis Date Noted  . COVID-19 virus infection 04/07/2020  . Pacemaker 12/21/2019  . UTI (urinary tract infection) 05/11/2019  . ARF (acute renal failure) ([place]) 05/10/2019  . Acute lower UTI 05/10/2019  . Symptomatic bradycardia 05/01/2018  . Near syncope 05/01/2018  . CKD (chronic kidney disease) stage 3, GFR 30-59 ml/min 05/01/2018  . Influenza B 04/10/2018  . Generalized weakness 04/10/2018  . Essential hypertension 03/01/2018  . COPD (chronic obstructive pulmonary disease) ([place]) 03/01/2018  . Bradycardia 12/15/2017  . AKI (acute kidney injury) ([place]) 07/18/2017  . Colostomy present ([place]) 11/08/2016  . Syncope 10/08/2016  . Acute kidney injury ([place]) 10/08/2016  . Abnormal chest x-ray 10/08/2016  . Lethargy 10/08/2016  . Dementia ([place])   . Sepsis ([place]) 09/05/2016  . Community acquired pneumonia 09/05/2016  . Acute encephalopathy 09/05/2016  . Diabetes mellitus with complication ([place]) [id number]  . Nausea & vomiting 04/09/2012  . Gallstones 03/15/2011  . Partial SBO 03/15/2011  . Colostomy hernia ([place]) 03/15/2011  . PSEUDOGOUT 02/28/2007  . IRON DEFIC ANEMIA [place] DIET IRON INTAKE 02/28/2007  . DISCITIS 02/28/2007  . OSTEOMYELITIS 02/28/2007  . [place] WITHOUT FEVER 02/28/2007   PCP:  Leeroy Cha, MD Pharmacy:   [facility] [place] [place] [address] Phone: [phone] Fax: [phone]     Social Determinants of Health (SDOH) Interventions    Readmission Risk Interventions No flowsheet data found.

## 2020-04-09 NOTE — Progress Notes (Signed)
Occupational Therapy Evaluation  Patient is pleasant and cooperative with hx of cognitive impairments. States she lives with DTR however chart says granddaughter, unsure of level of assist pt needs at baseline. Currently patient presents with poor standing balance, activity tolerance and safety requiring S-min G for UB ADL, min A for LB ADL and min A for functional transfers. Pt VSS on room air during session. Recommend continued acute OT services to maximize patient safety and balance in order to facilitate D/C to venue listed below.    04/09/20 1300  OT Visit Information  Last OT Received On 04/09/20  Assistance Needed +1  PT/OT/SLP Co-Evaluation/Treatment Yes  Reason for Co-Treatment For patient/therapist safety;To address functional/ADL transfers  OT goals addressed during session ADL's and self-care  History of Present Illness Pt is a 87 y.o. female with a history of CKD stage III, dementia, chronic diastolic heart failure, colostomy. Patient presented secondary to decreased urine output and mental status change and found to have dehydration and AKI from poor oral intake. COVID-19 positive.  Precautions  Precautions Fall  Restrictions  Weight Bearing Restrictions No  Home Living  Family/patient expects to be discharged to: Private residence  Additional Comments Pt with dementia and no family present.  She is from home.  Per prior visit she has RW and 24 hr care (unsure if this is still accurate)  Prior Function  Level of Independence Needs assistance  Comments Pt with dementia and no family present. She reports uses a RW.  Per prior visit, pt's Granddaughter assisted with ADLs.  Communication  Communication No difficulties  Pain Assessment  Pain Assessment Faces  Faces Pain Scale 0  Cognition  Arousal/Alertness Awake/alert  Behavior During Therapy WFL for tasks assessed/performed  Overall Cognitive Status No family/caregiver present to determine baseline cognitive functioning   Area of Impairment Orientation;Following commands;Safety/judgement;Memory;Problem solving  Orientation Level Disoriented to;Time;Situation  Memory Decreased short-term memory  Following Commands Follows one step commands consistently  Safety/Judgement Decreased awareness of safety  Problem Solving Slow processing;Decreased initiation;Difficulty sequencing;Requires verbal cues;Requires tactile cues  General Comments Pt with hx of dementia. She was able to follow commands and aware she was in hospital. Does later state "she ran to the store" when asking about who she lives with  Upper Extremity Assessment  Upper Extremity Assessment Generalized weakness  Lower Extremity Assessment  Lower Extremity Assessment Defer to PT evaluation  ADL  Overall ADL's  Needs assistance/impaired  Eating/Feeding Set up;Sitting  Eating/Feeding Details (indicate cue type and reason) assist with picking up fork from breakfast tray, able to bring fork to mouth to eat eggs  Grooming Wash/dry hands;Min guard;Standing  Grooming Details (indicate cue type and reason) min G for safety  Upper Body Bathing Supervision/ safety;Set up;Sitting  Lower Body Bathing Minimal assistance;Sitting/lateral leans;Sit to/from stand  Upper Body Dressing  Supervision/safety;Set up;Sitting  Lower Body Dressing Minimal assistance;Sitting/lateral leans;Sit to/from stand  Lower Body Dressing Details (indicate cue type and reason) seated EOB patient able to pull up sock, min A in standing for safety with balance  Toilet Transfer Minimal assistance;Ambulation;Regular Toilet;Grab bars;RW  Armed forces technical officer Details (indicate cue type and reason) cues for safety with hand placement, min A for eccentric control onto toilet  Toileting- Clothing Manipulation and Hygiene Minimal assistance;Sit to/from stand  Toileting - Clothing Manipulation Details (indicate cue type and reason) to manage gown  Functional mobility during ADLs Minimal  assistance;Rolling walker;Cueing for sequencing;Cueing for safety  General ADL Comments unsure of patient's baseline with self care, overall min A  for balance + functional transfers, cues for sequencing  Bed Mobility  Overal bed mobility Needs Assistance  Bed Mobility Supine to Sit  Supine to sit Min assist  General bed mobility comments extra time multimodal cues to scoot to bed edge  Transfers  Overall transfer level Needs assistance  Equipment used Rolling walker (2 wheeled)  Transfers Sit to/from Stand  Sit to Stand Min assist  General transfer comment steadying assist to rise and lower onto toilet  Balance  Overall balance assessment Needs assistance  Sitting-balance support Feet supported  Sitting balance-Leahy Scale Fair  Standing balance support Bilateral upper extremity supported  Standing balance-Leahy Scale Poor  Standing balance comment requiring min A with tendency to posterior lean  General Comments  General comments (skin integrity, edema, etc.) VSS on room air  OT - End of Session  Equipment Utilized During Treatment Rolling walker;Gait belt  Activity Tolerance Patient tolerated treatment well  Patient left in chair;with call bell/phone within reach;with chair alarm set  Nurse Communication Mobility status  OT Assessment  OT Recommendation/Assessment Patient needs continued OT Services  OT Visit Diagnosis Unsteadiness on feet (R26.81)  OT Problem List Decreased activity tolerance;Impaired balance (sitting and/or standing);Decreased safety awareness  OT Plan  OT Frequency (ACUTE ONLY) Min 2X/week  OT Treatment/Interventions (ACUTE ONLY) Self-care/ADL training;Therapeutic activities;Therapeutic exercise;Patient/family education;Balance training  AM-PAC OT "6 Clicks" Daily Activity Outcome Measure (Version 2)  Help from another person eating meals? 3  Help from another person taking care of personal grooming? 3  Help from another person toileting, which includes  using toliet, bedpan, or urinal? 3  Help from another person bathing (including washing, rinsing, drying)? 3  Help from another person to put on and taking off regular upper body clothing? 3  Help from another person to put on and taking off regular lower body clothing? 3  6 Click Score 18  OT Recommendation  Follow Up Recommendations SNF;Other (comment) (if family unable to provide level of care / 24/7 A)  OT Equipment 3 in 1 bedside commode;Other (comment) (if does not have at home)  Individuals Consulted  Consulted and Agree with Results and Recommendations Patient  Acute Rehab OT Goals  Patient Stated Goal agreeable to eat breakfast  OT Goal Formulation With patient  Time For Goal Achievement 04/23/20  Potential to Achieve Goals Good  OT Time Calculation  OT Start Time (ACUTE ONLY) [id number]  OT Stop Time (ACUTE ONLY) [id number]  OT Time Calculation (min) 24 min  OT General Charges  $OT Visit 1 Visit  OT Evaluation  $OT Eval Low Complexity 1 Low  Written Expression  Dominant Hand Right   Delbert Phenix OT OT pager: [phone]

## 2020-04-09 NOTE — Progress Notes (Signed)
PROGRESS NOTE    [name]  [id number] DOB: 26-Jul-1933 DOA: 04/06/2020 PCP: Leeroy Cha, MD   Brief Narrative: [name] is a 87 y.o. female with a history of CKD stage III, dementia, chronic diastolic heart failure, colostomy. Patient presented secondary to decreased urine output and mental status change and found to have dehydration and AKI from poor oral intake. COVID-19 positive. Started on IV fluids.   Assessment & Plan:   Principal Problem:   AKI (acute kidney injury) ([place]) Active Problems:   Diabetes mellitus with complication ([facility])   Acute encephalopathy   Dementia (HCC)   COPD (chronic obstructive pulmonary disease) (HCC)   CKD (chronic kidney disease) stage 3, GFR 30-59 ml/min   COVID-19 virus infection   AKI on CKD stage IIIb Baseline appears to be around 1.7-1.8. Creatinine of 2.47 on admission. Started on IV fluids with improving creatinine and is now well below baseline. IV fluids discontinued.  Acute metabolic encephalopathy Secondary to dehydration. Seems to have returned to baseline with IV fluid resuscitation.  Decreased urine output Likely secondary to dehydration from decreased oral hydration related to dementia. Resolved. -Strict in/out  COVID-19 infection Patient is unvaccinated. No symptoms. No pneumonia on chest x-ray. No hypoxia. Elevated d-dimer and CRP which are downtrending. -Daily CMP, CBC, D-dimer, CRP  Dementia It seems patient is appearing end-stage as patient has decreased desire to eat and drink. Discussed this with granddaughter who understands progression of dementia. Will likely qualify for home with hospice. -Palliative care consult pending -Continue Namenda, Buspar, Aricept  Diabetes mellitus, type 2 Patient is on metformin as an outpatient. Hemoglobin A1C of 5.7% this admission. -Continue SSI -May need to discontinue metformin on discharge secondary to kidney function  Metabolic acidosis High anion  gap. Secondary to AKI. Resolved.  Chronic diastolic heart failure Stable.  COPD -Continue Pulmicort  Hyperlipidemia -Continue pravastatin  History of symptomatic bradycardia S/p pacemaker.  S/p colostomy WOC consulted.   DVT prophylaxis: Heparin subq Code Status:   Code Status: Full Code Family Communication: Called granddaughter on telephone but no response. Disposition Plan: Discharge home vs SNF (patient declines SNF). Will likely be stable for discharge in 24 hours. Palliative care consulted for goals of care for decreased PO intake, however patient is eating better now. May not require hospice on discharge.   Consultants:   Palliative care medicine  Procedures:   None  Antimicrobials:  None    Subjective: No issues today. Does not want to go to rehab for PT.  Objective: Vitals:   04/08/20 0314 04/08/20 1352 04/08/20 2048 04/09/20 0656  BP: 131/62 128/61 138/71 139/75  Pulse: 61 71 85 62  Resp: '16 16 16 17  '$ Temp: 99.1 F (37.3 C) 98.8 F (37.1 C) 99 F (37.2 C) 98.3 F (36.8 C)  TempSrc: Oral Oral Oral Oral  SpO2: 96% 100% 99% 98%  Weight:      Height:        Intake/Output Summary (Last 24 hours) at 04/09/2020 1224 Last data filed at 04/09/2020 0600 Gross per 24 hour  Intake 120 ml  Output 1450 ml  Net -1330 ml   Filed Weights   04/06/20 1528 04/07/20 1502  Weight: 58.5 kg 54.1 kg    Examination:  General exam: Appears calm and comfortable  Respiratory system: Diminished with disposable stethoscope. Respiratory effort normal. Cardiovascular system: S1 & S2 heard, RRR. No murmurs, rubs, gallops or clicks. Gastrointestinal system: Abdomen is nondistended, soft and nontender. No organomegaly or masses felt.  Normal bowel sounds heard. Central nervous system: Alert and oriented to person and place. No focal neurological deficits. Musculoskeletal: No edema. No calf tenderness Skin: No cyanosis. No rashes Psychiatry: Judgement and insight  appear normal. Mood & affect appropriate.     Data Reviewed: I have personally reviewed following labs and imaging studies  CBC Lab Results  Component Value Date   WBC 4.2 04/09/2020   RBC 3.35 (L) 04/09/2020   HGB 10.2 (L) 04/09/2020   HCT 32.9 (L) 04/09/2020   MCV 98.2 04/09/2020   MCH 30.4 04/09/2020   PLT 152 04/09/2020   MCHC 31.0 04/09/2020   RDW 12.3 04/09/2020   LYMPHSABS 1.5 04/09/2020   MONOABS 0.6 04/09/2020   EOSABS 0.1 04/09/2020   BASOSABS 0.0 [id number]     Last metabolic panel Lab Results  Component Value Date   NA 142 04/09/2020   K 4.4 04/09/2020   CL 113 (H) 04/09/2020   CO2 19 (L) 04/09/2020   BUN 28 (H) 04/09/2020   CREATININE 1.39 (H) 04/09/2020   GLUCOSE 128 (H) 04/09/2020   GFRNONAA 37 (L) 04/09/2020   GFRAA 26 (L) 06/08/2019   CALCIUM 8.7 (L) 04/09/2020   PROT 6.1 (L) 04/09/2020   ALBUMIN 3.1 (L) 04/09/2020   BILITOT 0.3 04/09/2020   ALKPHOS 49 04/09/2020   AST 20 04/09/2020   ALT 9 04/09/2020   ANIONGAP 10 04/09/2020    CBG (last 3)  Recent Labs    04/08/20 2047 04/09/20 0735 04/09/20 1110  GLUCAP 242* 120* 132*     GFR: Estimated Creatinine Clearance: 25.7 mL/min (A) (by C-G formula based on SCr of 1.39 mg/dL (H)).  Coagulation Profile: No results for input(s): INR, PROTIME in the last 168 hours.  Recent Results (from the past 240 hour(s))  Urine Culture     Status: None   Collection Time: 04/06/20  9:36 PM   Specimen: Urine, Random  Result Value Ref Range Status   Specimen Description   Final    URINE, RANDOM Performed at [facility] [address], [place], [place] [address]    Special Requests   Final    NONE Performed at [facility], [place] [address], [place], [place] [address]    Culture   Final    NO GROWTH Performed at [facility], [place] [address], [place], [place] [address]    Report Status 04/08/2020 FINAL  Final  SARS CORONAVIRUS 2 (TAT 6-24 HRS)  Nasopharyngeal Nasopharyngeal Swab     Status: Abnormal   Collection Time: 04/07/20  1:04 AM   Specimen: Nasopharyngeal Swab  Result Value Ref Range Status   SARS Coronavirus 2 POSITIVE (A) NEGATIVE Final    Comment: (NOTE) SARS-CoV-2 target nucleic acids are DETECTED.  The SARS-CoV-2 RNA is generally detectable in upper and lower respiratory specimens during the acute phase of infection. Positive results are indicative of the presence of SARS-CoV-2 RNA. Clinical correlation with patient history and other diagnostic information is  necessary to determine patient infection status. Positive results do not rule out bacterial infection or co-infection with other viruses.  The expected result is Negative.  Fact Sheet for Patients: SugarRoll.be  Fact Sheet for Healthcare Providers: https://www.woods-mathews.com/  This test is not yet approved or cleared by the [place] FDA and  has been authorized for detection and/or diagnosis of SARS-CoV-2 by FDA under an Emergency Use Authorization (EUA). This EUA will remain  in effect (meaning this test can be used) for the duration  of the COVID-19 declaration under Section 564(b)(1) of the Act, 21 U. S.C. section 360bbb-3(b)(1), unless the authorization is terminated or revoked sooner.   Performed at [facility], [place] [address] [place], [place] [address]         Radiology Studies: No results found.      Scheduled Meds: . budesonide (PULMICORT) nebulizer solution  0.5 mg Nebulization BID  . busPIRone  5 mg Oral TID  . calcium-vitamin D  1 tablet Oral q morning - 10a  . Chlorhexidine Gluconate Cloth  6 each Topical Daily  . donepezil  10 mg Oral QHS  . escitalopram  10 mg Oral QPM  . feeding supplement  237 mL Oral BID BM  . heparin  5,000 Units Subcutaneous Q8H  . insulin aspart  0-5 Units Subcutaneous QHS  . insulin aspart  0-9 Units Subcutaneous TID WC  . memantine  28 mg  Oral q morning - 10a  . montelukast  10 mg Oral QHS  . multivitamin with minerals  1 tablet Oral Daily  . pravastatin  10 mg Oral QHS   Continuous Infusions:    LOS: 2 days     Cordelia Poche, MD [facility] 04/09/2020, 12:24 PM  If 7PM-7AM, please contact night-coverage [email or web address]

## 2020-04-09 NOTE — Progress Notes (Signed)
Physical Therapy Treatment Patient Details Name: [name] MRN: [id number] DOB: [birth year] Today's Date: 04/09/2020    History of Present Illness Pt is a 87 y.o. female with a history of CKD stage III, dementia, chronic diastolic heart failure, colostomy. Patient presented secondary to decreased urine output and mental status change and found to have dehydration and AKI from poor oral intake. COVID-19 positive.    PT Comments     The patient is very pleasant. Patient ambulated x 20' x 2 using RW . Patient will benefit from 24/7 assistance at DC. Patient requires frequent cues for task.  Follow Up Recommendations  SNF     Equipment Recommendations  None recommended by PT    Recommendations for Other Services       Precautions / Restrictions Precautions Precautions: Fall    Mobility  Bed Mobility   Bed Mobility: Supine to Sit     Supine to sit: Min assist     General bed mobility comments: extra time , multimodal cues for activity, to scoot to bed edge  Transfers Overall transfer level: Needs assistance Equipment used: 1 person hand held assist;Rolling walker (2 wheeled) Transfers: Sit to/from Stand Sit to Stand: Min assist         General transfer comment: steady assist to rise from bed and toilet  Ambulation/Gait Ambulation/Gait assistance: Min assist Gait Distance (Feet): 20 Feet (x 2) Assistive device: Rolling walker (2 wheeled) Gait Pattern/deviations: Decreased stride length     General Gait Details: Ambulated  x 20' x 2 to BR.   Stairs             Wheelchair Mobility    Modified Rankin (Stroke Patients Only)       Balance Overall balance assessment: Needs assistance Sitting-balance support: No upper extremity supported Sitting balance-Leahy Scale: Fair     Standing balance support: Bilateral upper extremity supported Standing balance-Leahy Scale: Poor Standing balance comment: requiring min A with tendency to posterior lean                             Cognition Arousal/Alertness: Awake/alert Behavior During Therapy: WFL for tasks assessed/performed Overall Cognitive Status: No family/caregiver present to determine baseline cognitive functioning Area of Impairment: Orientation;Following commands;Safety/judgement;Memory;Problem solving                     Memory: Decreased short-term memory Following Commands: Follows one step commands consistently Safety/Judgement: Decreased awareness of safety   Problem Solving: Slow processing;Decreased initiation;Difficulty sequencing;Requires verbal cues;Requires tactile cues General Comments: Pt with hx of dementia. She was able to follow commands and aware she was in hospital.      Exercises      General Comments        Pertinent Vitals/Pain Pain Assessment: No/denies pain    Home Living                      Prior Function            PT Goals (current goals can now be found in the care plan section) Progress towards PT goals: Progressing toward goals    Frequency    Min 2X/week      PT Plan Current plan remains appropriate;Frequency needs to be updated    Co-evaluation PT/OT/SLP Co-Evaluation/Treatment: Yes Reason for Co-Treatment: For patient/therapist safety PT goals addressed during session: Mobility/safety with mobility        AM-PAC PT "  6 Clicks" Mobility   Outcome Measure  Help needed turning from your back to your side while in a flat bed without using bedrails?: A Little Help needed moving from lying on your back to sitting on the side of a flat bed without using bedrails?: A Little Help needed moving to and from a bed to a chair (including a wheelchair)?: A Little Help needed standing up from a chair using your arms (e.g., wheelchair or bedside chair)?: A Little Help needed to walk in hospital room?: A Little Help needed climbing 3-5 steps with a railing? : A Lot 6 Click Score: 17    End of Session  Equipment Utilized During Treatment: Gait belt Activity Tolerance: Patient tolerated treatment well Patient left: with chair alarm set;in chair;with call bell/phone within reach Nurse Communication: Mobility status PT Visit Diagnosis: Unsteadiness on feet (R26.81);Muscle weakness (generalized) (M62.81)     Time: [id number] PT Time Calculation (min) (ACUTE ONLY): 25 min  Charges:  $Gait Training: 8-22 mins                     [place] Pager [phone] Office [phone]    Claretha Cooper 04/09/2020, 12:33 PM

## 2020-04-10 LAB — COMPREHENSIVE METABOLIC PANEL
ALT: 9 U/L (ref 0–44)
AST: 19 U/L (ref 15–41)
Albumin: 3.2 g/dL — ABNORMAL LOW (ref 3.5–5.0)
Alkaline Phosphatase: 60 U/L (ref 38–126)
Anion gap: 7 (ref 5–15)
BUN: 27 mg/dL — ABNORMAL HIGH (ref 8–23)
CO2: 23 mmol/L (ref 22–32)
Calcium: 8.7 mg/dL — ABNORMAL LOW (ref 8.9–10.3)
Chloride: 112 mmol/L — ABNORMAL HIGH (ref 98–111)
Creatinine, Ser: 1.4 mg/dL — ABNORMAL HIGH (ref 0.44–1.00)
GFR, Estimated: 37 mL/min — ABNORMAL LOW (ref >60)
Glucose, Bld: 112 mg/dL — ABNORMAL HIGH (ref 70–99)
Potassium: 4 mmol/L (ref 3.5–5.1)
Sodium: 142 mmol/L (ref 135–145)
Total Bilirubin: 0.4 mg/dL (ref 0.3–1.2)
Total Protein: 6.3 g/dL — ABNORMAL LOW (ref 6.5–8.1)

## 2020-04-10 LAB — CBC WITH DIFFERENTIAL/PLATELET
Abs Immature Granulocytes: 0.07 10*3/uL (ref 0.00–0.07)
Basophils Absolute: 0 10*3/uL (ref 0.0–0.1)
Basophils Relative: 0 %
Eosinophils Absolute: 0.2 10*3/uL (ref 0.0–0.5)
Eosinophils Relative: 4 %
HCT: 32.9 % — ABNORMAL LOW (ref 36.0–46.0)
Hemoglobin: 10.2 g/dL — ABNORMAL LOW (ref 12.0–15.0)
Immature Granulocytes: 1 %
Lymphocytes Relative: 41 %
Lymphs Abs: 2 10*3/uL (ref 0.7–4.0)
MCH: 30.8 pg (ref 26.0–34.0)
MCHC: 31 g/dL (ref 30.0–36.0)
MCV: 99.4 fL (ref 80.0–100.0)
Monocytes Absolute: 0.7 10*3/uL (ref 0.1–1.0)
Monocytes Relative: 14 %
Neutro Abs: 2 10*3/uL (ref 1.7–7.7)
Neutrophils Relative %: 40 %
Platelets: 141 10*3/uL — ABNORMAL LOW (ref 150–400)
RBC: 3.31 MIL/uL — ABNORMAL LOW (ref 3.87–5.11)
RDW: 12.3 % (ref 11.5–15.5)
WBC: 5 10*3/uL (ref 4.0–10.5)
nRBC: 0 % (ref 0.0–0.2)

## 2020-04-10 LAB — C-REACTIVE PROTEIN: CRP: 1.3 mg/dL — ABNORMAL HIGH (ref <1.0)

## 2020-04-10 LAB — GLUCOSE, CAPILLARY
Glucose-Capillary: 101 mg/dL — ABNORMAL HIGH (ref 70–99)
Glucose-Capillary: 97 mg/dL (ref 70–99)
Glucose-Capillary: 128 mg/dL — ABNORMAL HIGH (ref 70–99)
Glucose-Capillary: 127 mg/dL — ABNORMAL HIGH (ref 70–99)

## 2020-04-10 LAB — D-DIMER, QUANTITATIVE: D-Dimer, Quant: 2.94 ug/mL-FEU — ABNORMAL HIGH (ref 0.00–0.50)

## 2020-04-10 NOTE — Progress Notes (Signed)
PROGRESS NOTE  [name] [id number] DOB: Sep 28, 1933 DOA: 04/06/2020 PCP: Leeroy Cha, MD   LOS: 3 days   [name] is a 87 y.o. female with a history of CKD stage III, dementia, chronic diastolic heart failure, history of colostomy urinary output and mental status changes.  Patient was noted to be dehydrated with poor oral intake and was incidentally noted to have COVID-19 positive.  Patient was started on IV fluids and was admitted to hospital for further evaluation and treatment  Assessment & Plan:   Principal Problem:   AKI (acute kidney injury) ([place]) Active Problems:   Diabetes mellitus with complication ([place])   Acute encephalopathy   Dementia ([place])   COPD (chronic obstructive pulmonary disease) ([facility])   CKD (chronic kidney disease) stage 3, GFR 30-59 ml/min   COVID-19 virus infection  AKI on CKD stage IIIb with metabolic acidosis. Patient had creatinine of 2.4 on presentation.  Baseline creatinine of 1.7-1.8.  Received IV fluids with improvement.  At this time IV fluids have been discontinued.  Latest creatinine of 1.4.  Was on Metformin at home which is on hold.  Acute metabolic encephalopathy Secondary to volume depletion.  Has improved as per the staff  Volume depletion.  Has improved.  Asymptomatic COVID-19 infection Patient is unvaccinated.  No pneumonia or hypoxia.  Mildly elevated inflammatory markers which were downtrending.  COVID-19 Labs  Recent Labs    04/08/20 0414 04/09/20 0413 04/10/20 0412  DDIMER 7.13* 5.08* 2.94*  CRP 2.4* 1.0* 1.3*    Lab Results  Component Value Date   SARSCOV2NAA POSITIVE (A) 04/07/2020   [place] NEGATIVE 05/10/2019   [place] NEGATIVE 04/19/2019    Dementia Likely advanced dementia.  Poor oral intake.  Palliative care on board at this time.  Increasing oral intake as per the staff.  Patient will benefit from palliative care follow-up as outpatient  Diabetes mellitus, type 2 Latest  Hemoglobin A1C of 5.7% this admission. Continue SSI, Accu-Cheks, diabetic diet.  Patient was on Metformin at home.  Chronic diastolic heart failure Compensated at this time.  COPD -Continue Pulmicort, compensated.  Hyperlipidemia Continue statins.  History of symptomatic bradycardia S/p pacemaker.  S/p colostomy WOC consulted.  Code Status:  Full code  Family Communication: Unable to reach the patient's granddaughter on the phone  Status is: Inpatient  Remains inpatient appropriate because:IV treatments appropriate due to intensity of illness or inability to take PO, Inpatient level of care appropriate due to severity of illness and poor oral intake   Dispo: The patient is from: Home              Anticipated d/c is to: SNF as per physical therapy recommendation              Anticipated d/c date is: 2 days              Patient currently is not medically stable to d/c.   Difficult to place patient No   Consultants:  None  Procedures:  None  Anti-infectives:  . None  Anti-infectives (From admission, onward)   None     Subjective: Today, patient was seen and examined at bedside.  Patient states that she feels ok, no pain.  disoriented to time place and person.  Feels tired.  Objective: Vitals:   04/10/20 0523 04/10/20 1300  BP: (!) 124/51 115/61  Pulse: 62 71  Resp: 17 16  Temp: 98 F (36.7 C) 97.8 F (36.6 C)  SpO2: 98% 99%  Intake/Output Summary (Last 24 hours) at 04/10/2020 1421 Last data filed at 04/10/2020 1132 Gross per 24 hour  Intake 195 ml  Output 1400 ml  Net -1205 ml   Filed Weights   04/06/20 1528 04/07/20 1502  Weight: 58.5 kg 54.1 kg   Body mass index is 20.48 kg/m.   Physical Exam:  GENERAL: Patient is communicative, disoriented. Not in obvious distress. HENT: No scleral pallor or icterus. Pupils equally reactive to light. Oral mucosa is moist NECK: is supple, no gross swelling noted. CHEST: Clear to auscultation. No  crackles or wheezes.  Diminished breath sounds bilaterally. CVS: S1 and S2 heard, no murmur. Regular rate and rhythm.  PEG tube in place. ABDOMEN: Soft, non-tender, bowel sounds are present.  Colostomy in place EXTREMITIES: No edema. CNS: Cranial nerves are intact.  Moves all extremities. SKIN: warm and dry without rashes.  Data Review: I have personally reviewed the following laboratory data and studies,  CBC: Recent Labs  Lab 04/06/20 1918 04/07/20 0435 04/08/20 0414 04/09/20 0413 04/10/20 0412  WBC 4.2 3.5* 2.8* 4.2 5.0  NEUTROABS 2.2  --  1.0* 1.9 2.0  HGB 11.4* 10.5* 9.8* 10.2* 10.2*  HCT 37.1 33.9* 31.5* 32.9* 32.9*  MCV 100.0 100.6* 99.4 98.2 99.4  PLT 153 149* 134* 152 [id number]*   Basic Metabolic Panel: Recent Labs  Lab 04/06/20 1918 04/07/20 0435 04/08/20 0414 04/09/20 0413 04/10/20 0412  NA 141 142 144 142 142  K 5.0 4.6 4.3 4.4 4.0  CL 107 111 114* 113* 112*  CO2 18* 17* 22 19* 23  GLUCOSE 71 75 178* 128* 112*  BUN 54* 49* 32* 28* 27*  CREATININE 2.47* 2.29* 1.57* 1.39* 1.40*  CALCIUM 9.8 9.0 8.6* 8.7* 8.7*   Liver Function Tests: Recent Labs  Lab 04/07/20 0212 04/08/20 0414 04/09/20 0413 04/10/20 0412  AST '15 18 20 19  '$ ALT '8 8 9 9  '$ ALKPHOS 55 45 49 60  BILITOT 1.0 0.6 0.3 0.4  PROT 6.1* 5.6* 6.1* 6.3*  ALBUMIN 3.1* 2.9* 3.1* 3.2*   No results for input(s): LIPASE, AMYLASE in the last 168 hours. Recent Labs  Lab 04/07/20 0210  AMMONIA 17   Cardiac Enzymes: No results for input(s): CKTOTAL, CKMB, CKMBINDEX, TROPONINI in the last 168 hours. BNP (last 3 results) No results for input(s): BNP in the last 8760 hours.  ProBNP (last 3 results) No results for input(s): PROBNP in the last 8760 hours.  CBG: Recent Labs  Lab 04/09/20 1110 04/09/20 1637 04/09/20 2107 04/10/20 0810 04/10/20 1114  GLUCAP 132* 161* 105* 101* 97   Recent Results (from the past 240 hour(s))  Urine Culture     Status: None   Collection Time: 04/06/20  9:36 PM    Specimen: Urine, Random  Result Value Ref Range Status   Specimen Description   Final    URINE, RANDOM Performed at [facility], [place] [address], [place], [place] [address]    Special Requests   Final    NONE Performed at [facility], [place] [address] [place], [place] [address]    Culture   Final    NO GROWTH Performed at [facility], [place] [address] [place], [place] [address]    Report Status 04/08/2020 FINAL  Final  SARS CORONAVIRUS 2 (TAT 6-24 HRS) Nasopharyngeal Nasopharyngeal Swab     Status: Abnormal   Collection Time: 04/07/20  1:04 AM   Specimen: Nasopharyngeal Swab  Result Value Ref Range Status  SARS Coronavirus 2 POSITIVE (A) NEGATIVE Final    Comment: (NOTE) SARS-CoV-2 target nucleic acids are DETECTED.  The SARS-CoV-2 RNA is generally detectable in upper and lower respiratory specimens during the acute phase of infection. Positive results are indicative of the presence of SARS-CoV-2 RNA. Clinical correlation with patient history and other diagnostic information is  necessary to determine patient infection status. Positive results do not rule out bacterial infection or co-infection with other viruses.  The expected result is Negative.  Fact Sheet for Patients: SugarRoll.be  Fact Sheet for Healthcare Providers: https://www.woods-mathews.com/  This test is not yet approved or cleared by the [place] FDA and  has been authorized for detection and/or diagnosis of SARS-CoV-2 by FDA under an Emergency Use Authorization (EUA). This EUA will remain  in effect (meaning this test can be used) for the duration of the COVID-19 declaration under Section 564(b)(1) of the Act, 21 U. S.C. section 360bbb-3(b)(1), unless the authorization is terminated or revoked sooner.   Performed at [facility], [place] [address], [place], [place] [address]      Studies: No results  found.    Flora Lipps, MD  [facility] 04/10/2020  If 7PM-7AM, please contact night-coverage

## 2020-04-10 NOTE — Care Management Important Message (Signed)
Important Message  Patient Details IM Letter placed in Patient's door Caddy. Name: [name] MRN: [id number] Date of Birth: 07/22/1933   Medicare Important Message Given:  Yes     Kerin Salen 04/10/2020, 1:19 PM

## 2020-04-10 NOTE — Consult Note (Signed)
Consultation Note Date: 04/10/2020   Patient Name: [name]  DOB: [birth year]  MRN: [id number]  Age / Sex: 87 y.o., female  PCP: Leeroy Cha, MD Referring Physician: Flora Lipps, MD  Reason for Consultation: Establishing goals of care  HPI/Patient Profile: 87 y.o. female  with past medical history of CKD stage III, dementia, chronic diastolic heart failure, colostomy admitted on 04/06/2020 with decreased urine output and mental status change and found to have dehydration and AKI as well as being positive for COVID-19.  She was started on IV fluids and has had improvement in her kidney function and mental status.  She was having poor intake and concern that she may be approaching end-of-life.  Palliative consulted for goals of care.  Clinical Assessment and Goals of Care: Chart reviewed including personal review of pertinent labs and imaging.  I saw and examined [name] today.  She is pleasantly confused but knows she is in the hospital.  She reports that staff has been very helpful in treating her well.  She cannot really tell me exactly why she is here in the hospital.  She reports that she wants to get home with her family (granddaughter) and does not really have any further needs at this time.  She is cooperative and answers questions largely appropriately, however, she does not really have insight into her condition to have any discussion regarding long-term goals of care.  SUMMARY OF RECOMMENDATIONS   -Full code/full scope as indicated by MOST form -Her appetite seems to be improving per RN report as she ate most of her breakfast this morning.  I believe her prognosis will likely be driven by nutrition and hydration and will need to continue to monitor this closely.  If she is eating and drinking enough to sustain herself, I do no know that she would qualify for home hospice services  (if even desired by family) as generally one must have a FAST score of 7C or greater to qualify for hospice based upon diagnosis of dementia.  She is somewhat confused but able to hold conversation with me today and is documented to be ambulatory with a rolling walker.  She does have other comorbidities that would certainly contribute to her overall prognosis, but she may be best served by plan for outpatient palliative care to follow her in the next few weeks to determine her likely clinical course when she is back out of the hospital. -Attempted to call patient's granddaughter but was unable to reach her today.  We will plan to reach out again tomorrow.  Code Status/Advance Care Planning:  Full code  Prognosis:   Unable to determine  Discharge Planning: To Be Determined      Primary Diagnoses: Present on Admission: . CKD (chronic kidney disease) stage 3, GFR 30-59 ml/min . AKI (acute kidney injury) ([place]) . Diabetes mellitus with complication ([place]) . Dementia ([place]) . Acute encephalopathy . COPD (chronic obstructive pulmonary disease) ([place])   I have reviewed the medical record, interviewed the patient and family, and  examined the patient. The following aspects are pertinent.  Past Medical History:  Diagnosis Date  . Asthma   . CKD (chronic kidney disease) stage 3, GFR 30-59 ml/min ([place]) 05/01/2018  . Colostomy present ([place])    hx/notes 07/17/2010  . Dementia ([place])   . Diabetic neuropathy ([place])   . Hypertension    hx/notes 07/17/2010  . Pneumonia    recent/notes 10/08/2016  . Symptomatic bradycardia 12/15/2017  . Type II diabetes mellitus ([place])    hx/notes 07/17/2010   Social History   Socioeconomic History  . Marital status: Single    Spouse name: Not on file  . Number of children: Not on file  . Years of education: Not on file  . Highest education level: Not on file  Occupational History  . Not on file  Tobacco Use  . Smoking status: Never Smoker  . Smokeless tobacco:  Former Systems developer    Types: Secondary school teacher  . Vaping Use: Never used  Substance and Sexual Activity  . Alcohol use: No  . Drug use: No  . Sexual activity: Never  Other Topics Concern  . Not on file  Social History Narrative  . Not on file   Social Determinants of Health   Financial Resource Strain: Not on file  Food Insecurity: Not on file  Transportation Needs: Not on file  Physical Activity: Not on file  Stress: Not on file  Social Connections: Not on file   Family History  Problem Relation Age of Onset  . Kidney disease Mother   . Hypertension Mother   . Other Father        gangrene with amputation  . Diabetes Sister   . Cancer Brother   . Heart disease Brother   . Schizophrenia Daughter   . Diabetes Daughter   . Stroke Daughter   . Diabetes Sister   . Breast cancer Neg Hx    Scheduled Meds: . budesonide (PULMICORT) nebulizer solution  0.5 mg Nebulization BID  . busPIRone  5 mg Oral TID  . calcium-vitamin D  1 tablet Oral q morning - 10a  . Chlorhexidine Gluconate Cloth  6 each Topical Daily  . donepezil  10 mg Oral QHS  . escitalopram  10 mg Oral QPM  . feeding supplement  237 mL Oral BID BM  . heparin  5,000 Units Subcutaneous Q8H  . insulin aspart  0-5 Units Subcutaneous QHS  . insulin aspart  0-9 Units Subcutaneous TID WC  . memantine  28 mg Oral q morning - 10a  . montelukast  10 mg Oral QHS  . multivitamin with minerals  1 tablet Oral Daily  . pravastatin  10 mg Oral QHS   Continuous Infusions: PRN Meds:.acetaminophen **OR** acetaminophen, albuterol, ondansetron **OR** ondansetron (ZOFRAN) IV Medications Prior to Admission:  Prior to Admission medications   Medication Sig Start Date End Date Taking? Authorizing Provider  acetaminophen (TYLENOL) 500 MG tablet Take 500 mg by mouth daily as needed for fever or headache (pain).   Yes [provider]  albuterol (PROVENTIL HFA;VENTOLIN HFA) 108 (90 Base) MCG/ACT inhaler Inhale 1-2 puffs into the  lungs every 4 (four) hours as needed for wheezing or shortness of breath.    Yes [provider]  alendronate (FOSAMAX) 70 MG tablet Take 70 mg by mouth every Sunday.   Yes [provider]  busPIRone (BUSPAR) 5 MG tablet Take 5 mg by mouth 3 (three) times daily. 11/24/17  Yes [provider]  Calcium Carb-Cholecalciferol (  CALCIUM 600-D PO) Take 1 tablet by mouth every morning.   Yes [provider]  donepezil (ARICEPT) 10 MG tablet Take 10 mg by mouth at bedtime.  02/24/15  Yes [provider]  escitalopram (LEXAPRO) 10 MG tablet Take 10 mg by mouth every evening. 06/16/17  Yes [provider]  FLOVENT HFA 220 MCG/ACT inhaler Inhale 1 puff into the lungs 2 (two) times daily. Patient taking differently: Inhale 1 puff into the lungs 2 (two) times daily as needed (sob/wheezing). 05/31/18  Yes Mannam, Praveen, MD  memantine (NAMENDA XR) 28 MG CP24 24 hr capsule Take 28 mg by mouth every morning.   Yes [provider]  metFORMIN (GLUCOPHAGE-XR) 500 MG 24 hr tablet Take 500 mg by mouth daily with supper. 03/24/19  Yes [provider]  montelukast (SINGULAIR) 10 MG tablet Take 10 mg by mouth at bedtime.  06/21/17  Yes [provider]  pravastatin (PRAVACHOL) 10 MG tablet Take 10 mg by mouth at bedtime. 12/12/19  Yes [provider]   Allergies  Allergen Reactions  . Aspirin Other (See Comments)    Triggers asthma   Review of Systems  Denies all complaints but poor historian  Physical Exam  General: Alert, awake, in no acute distress.  HEENT: No bruits, no goiter, no JVD Heart: Regular rate and rhythm. No murmur appreciated. Lungs: Fair air movement, clear Abdomen: Soft, nontender, nondistended, positive bowel sounds.  Ext: No significant edema Skin: Warm and dry  Vital Signs: BP (!) 124/51 (BP Location: Left Arm)   Pulse 62   Temp 98 F (36.7 C) (Oral)   Resp 17   Ht '5\' 4"'$  (1.626 m) Comment: estimate by  staff  Wt 54.1 kg   SpO2 98%   BMI 20.48 kg/m  Pain Scale: 0-10   Pain Score: 0-No pain   SpO2: SpO2: 98 % O2 Device:SpO2: 98 % O2 Flow Rate: .   IO: Intake/output summary:   Intake/Output Summary (Last 24 hours) at 04/10/2020 [id number] Last data filed at 04/10/2020 0600 Gross per 24 hour  Intake 120 ml  Output 850 ml  Net -730 ml    LBM: Last BM Date: 04/08/20 Baseline Weight: Weight: 58.5 kg Most recent weight: Weight: 54.1 kg     Palliative Assessment/Data:   Flowsheet Rows   Flowsheet Row Most Recent Value  Intake Tab   Referral Department Hospitalist  Unit at Time of Referral Med/Surg Unit  Palliative Care Primary Diagnosis Neurology  Date Notified 04/07/20  Palliative Care Type New Palliative care  Reason for referral Clarify Goals of Care  Date of Admission 04/06/20  Date first seen by Palliative Care 04/09/20  # of days Palliative referral response time 2 Day(s)  # of days IP prior to Palliative referral 1  Clinical Assessment   Palliative Performance Scale Score 40%  Psychosocial & Spiritual Assessment   Palliative Care Outcomes       Time In: 1000 Time Out: 1045 Time Total: 45 Greater than 50%  of this time was spent counseling and coordinating care related to the above assessment and plan.  Signed by: Micheline Rough, MD   Please contact Palliative Medicine Team phone at [phone] for questions and concerns.  For individual provider: See Shea Evans

## 2020-04-10 NOTE — Progress Notes (Addendum)
Daily Progress Note   Patient Name: [name]       Date: 04/10/2020 DOB: 1934/01/06  Age: 87 y.o. MRN#: [id number] Attending Physician: Flora Lipps, MD Primary Care Physician: Leeroy Cha, MD Admit Date: 04/06/2020  Reason for Consultation/Follow-up: Establishing goals of care  Subjective: I saw and examined [name] today.  She was lying in bed but immediately acknowledged me on entering her room.  She reports being a little tired and declined to start eating breakfast (tray was at the bedside), but otherwise reports doing well today.  Discussed with LCSW as well as Therapist, sports.  Overall, intake seems to be improving with her talking in > 50% of meals yesterday.  Discussed plan for bed search, but reality may be that a bed at rehab may not be able to be found.    Length of Stay: 3  Current Medications: Scheduled Meds:  . budesonide (PULMICORT) nebulizer solution  0.5 mg Nebulization BID  . busPIRone  5 mg Oral TID  . calcium-vitamin D  1 tablet Oral q morning - 10a  . Chlorhexidine Gluconate Cloth  6 each Topical Daily  . donepezil  10 mg Oral QHS  . escitalopram  10 mg Oral QPM  . feeding supplement  237 mL Oral BID BM  . heparin  5,000 Units Subcutaneous Q8H  . insulin aspart  0-5 Units Subcutaneous QHS  . insulin aspart  0-9 Units Subcutaneous TID WC  . memantine  28 mg Oral q morning - 10a  . montelukast  10 mg Oral QHS  . multivitamin with minerals  1 tablet Oral Daily  . pravastatin  10 mg Oral QHS    Continuous Infusions:   PRN Meds: acetaminophen **OR** acetaminophen, albuterol, ondansetron **OR** ondansetron (ZOFRAN) IV  Physical Exam         General: Alert, awake, in no acute distress. Lying in bed and greets me appropriately on entering the  room. HEENT: No bruits, no goiter, no JVD Heart: Regular rate and rhythm. No murmur appreciated. Lungs: Good air movement, clear Abdomen: Soft, nontender, nondistended, positive bowel sounds.  Ext: No significant edema Skin: Warm and dry Neuro: Grossly intact, nonfocal.  Vital Signs: BP (!) 124/51 (BP Location: Left Arm)   Pulse 62   Temp 98 F (36.7 C) (Oral)   Resp 17  Ht '5\' 4"'$  (1.626 m) Comment: estimate by staff  Wt 54.1 kg   SpO2 98%   BMI 20.48 kg/m  SpO2: SpO2: 98 % O2 Device: O2 Device: Room Air O2 Flow Rate:    Intake/output summary:   Intake/Output Summary (Last 24 hours) at 04/10/2020 0949 Last data filed at 04/10/2020 0600 Gross per 24 hour  Intake 120 ml  Output 850 ml  Net -730 ml   LBM: Last BM Date: 04/08/20 Baseline Weight: Weight: 58.5 kg Most recent weight: Weight: 54.1 kg       Palliative Assessment/Data:    Flowsheet Rows   Flowsheet Row Most Recent Value  Intake Tab   Referral Department Hospitalist  Unit at Time of Referral Med/Surg Unit  Palliative Care Primary Diagnosis Neurology  Date Notified 04/07/20  Palliative Care Type New Palliative care  Reason for referral Clarify Goals of Care  Date of Admission 04/06/20  Date first seen by Palliative Care 04/09/20  # of days Palliative referral response time 2 Day(s)  # of days IP prior to Palliative referral 1  Clinical Assessment   Palliative Performance Scale Score 40%  Psychosocial & Spiritual Assessment   Palliative Care Outcomes       Patient Active Problem List   Diagnosis Date Noted  . COVID-19 virus infection 04/07/2020  . Pacemaker 12/21/2019  . UTI (urinary tract infection) 05/11/2019  . ARF (acute renal failure) ([place]) 05/10/2019  . Acute lower UTI 05/10/2019  . Symptomatic bradycardia 05/01/2018  . Near syncope 05/01/2018  . CKD (chronic kidney disease) stage 3, GFR 30-59 ml/min 05/01/2018  . Influenza B 04/10/2018  . Generalized weakness 04/10/2018  . Essential  hypertension 03/01/2018  . COPD (chronic obstructive pulmonary disease) ([place]) 03/01/2018  . Bradycardia 12/15/2017  . AKI (acute kidney injury) ([place]) 07/18/2017  . Colostomy present ([place]) 11/08/2016  . Syncope 10/08/2016  . Acute kidney injury ([place]) 10/08/2016  . Abnormal chest x-ray 10/08/2016  . Lethargy 10/08/2016  . Dementia ([place])   . Sepsis ([place]) 09/05/2016  . Community acquired pneumonia 09/05/2016  . Acute encephalopathy 09/05/2016  . Diabetes mellitus with complication ([place]) [id number]  . Nausea & vomiting 04/09/2012  . Gallstones 03/15/2011  . Partial SBO 03/15/2011  . Colostomy hernia ([place]) 03/15/2011  . PSEUDOGOUT 02/28/2007  . IRON DEFIC ANEMIA [place] DIET IRON INTAKE 02/28/2007  . DISCITIS 02/28/2007  . OSTEOMYELITIS 02/28/2007  . CHILLS WITHOUT FEVER 02/28/2007    Palliative Care Assessment & Plan   Patient Profile: 87 y.o. female  with past medical history of CKD stage III, dementia, chronic diastolic heart failure, colostomy admitted on 04/06/2020 with decreased urine output and mental status change and found to have dehydration and AKI as well as being positive for COVID-19.  She was started on IV fluids and has had improvement in her kidney function and mental status.  She was having poor intake and concern that she may be approaching end-of-life, but this seems to have improved over the last day or so.  Palliative consulted for goals of care.  Recommendations/Plan:  Full code/full scope- MOST form on file  Attempted to reach granddaughter again this morning.  No answer on home phone and mailbox remains full on mobile number.  Her intake seems to be improving and I doubt she would be hospice eligible if she is taking in adequate nutrition and hydration.  I asked staff to continue to monitor this closely.  Recommend outpatient palliative care follow-up to continue to monitor her nutrition and functional  status moving forward.  Goals of Care and Additional  Recommendations:  Limitations on Scope of Treatment: Full Scope Treatment  Code Status:    Code Status Orders  (From admission, onward)         Start     Ordered   04/07/20 0213  Full code  Continuous        04/07/20 0216        Code Status History    Date Active Date Inactive Code Status Order ID Comments User Context   05/10/2019 2056 05/14/2019 0643 Full Code [id number]  Rise Patience, MD ED   05/01/2018 1634 05/03/2018 1806 Full Code [id number]  Karmen Bongo, MD ED   04/10/2018 0314 04/11/2018 1856 Full Code [id number]  Etta Quill, DO ED   07/18/2017 1735 07/19/2017 2009 Full Code [id number]  Hosie Poisson, MD Inpatient   11/08/2016 0401 11/09/2016 1857 Full Code [id number]  Etta Quill, DO ED   10/08/2016 1616 10/09/2016 1924 Full Code [id number]  Radene Gunning, NP ED   09/05/2016 0555 09/09/2016 1833 Full Code [id number]  Norval Morton, MD ED   04/09/2012 1813 04/12/2012 1636 Full Code [id number]  Dagoberto Ligas, RN Inpatient   03/15/2011 0936 03/19/2011 2005 Full Code [id number]  Aniceto Boss, RN Inpatient   Advance Care Planning Activity    Advance Directive Documentation   Flowsheet Row Most Recent Value  Type of Advance Directive Healthcare Power of Attorney  Pre-existing out of facility DNR order (yellow form or pink MOST form) --  "MOST" Form in Place? --       Prognosis:   Unable to determine- This will largely depend upon her ability to maintain her nutrition and hydration.  Currently these seem to be improving.  Discharge Planning:  To Be Determined  Care plan was discussed with LCSW, RN  Thank you for allowing the Palliative Medicine Team to assist in the care of this patient.   Time In: 0900 Time Out: 0925 Total Time 25 Prolonged Time Billed No      Greater than 50%  of this time was spent counseling and coordinating care related to the above assessment and plan.  Micheline Rough, MD  Please contact Palliative Medicine Team phone at [phone]  for questions and concerns.

## 2020-04-11 DIAGNOSIS — Z433 Encounter for attention to colostomy: Secondary | ICD-10-CM | POA: Diagnosis not present

## 2020-04-11 DIAGNOSIS — R2681 Unsteadiness on feet: Secondary | ICD-10-CM | POA: Diagnosis not present

## 2020-04-11 DIAGNOSIS — Z515 Encounter for palliative care: Secondary | ICD-10-CM | POA: Diagnosis not present

## 2020-04-11 DIAGNOSIS — Z7189 Other specified counseling: Secondary | ICD-10-CM | POA: Diagnosis not present

## 2020-04-11 DIAGNOSIS — N179 Acute kidney failure, unspecified: Secondary | ICD-10-CM | POA: Diagnosis not present

## 2020-04-11 DIAGNOSIS — J449 Chronic obstructive pulmonary disease, unspecified: Secondary | ICD-10-CM | POA: Diagnosis not present

## 2020-04-11 DIAGNOSIS — Z950 Presence of cardiac pacemaker: Secondary | ICD-10-CM | POA: Diagnosis not present

## 2020-04-11 DIAGNOSIS — Z933 Colostomy status: Secondary | ICD-10-CM | POA: Diagnosis not present

## 2020-04-11 DIAGNOSIS — J438 Other emphysema: Secondary | ICD-10-CM

## 2020-04-11 DIAGNOSIS — E782 Mixed hyperlipidemia: Secondary | ICD-10-CM | POA: Diagnosis not present

## 2020-04-11 DIAGNOSIS — G9340 Encephalopathy, unspecified: Secondary | ICD-10-CM | POA: Diagnosis not present

## 2020-04-11 DIAGNOSIS — R1312 Dysphagia, oropharyngeal phase: Secondary | ICD-10-CM | POA: Diagnosis not present

## 2020-04-11 DIAGNOSIS — N183 Chronic kidney disease, stage 3 unspecified: Secondary | ICD-10-CM | POA: Diagnosis not present

## 2020-04-11 DIAGNOSIS — I5032 Chronic diastolic (congestive) heart failure: Secondary | ICD-10-CM | POA: Diagnosis not present

## 2020-04-11 DIAGNOSIS — M6281 Muscle weakness (generalized): Secondary | ICD-10-CM | POA: Diagnosis not present

## 2020-04-11 DIAGNOSIS — K631 Perforation of intestine (nontraumatic): Secondary | ICD-10-CM | POA: Diagnosis not present

## 2020-04-11 DIAGNOSIS — G9341 Metabolic encephalopathy: Secondary | ICD-10-CM | POA: Diagnosis not present

## 2020-04-11 DIAGNOSIS — F329 Major depressive disorder, single episode, unspecified: Secondary | ICD-10-CM | POA: Diagnosis not present

## 2020-04-11 DIAGNOSIS — E118 Type 2 diabetes mellitus with unspecified complications: Secondary | ICD-10-CM | POA: Diagnosis not present

## 2020-04-11 DIAGNOSIS — J069 Acute upper respiratory infection, unspecified: Secondary | ICD-10-CM | POA: Diagnosis not present

## 2020-04-11 DIAGNOSIS — R279 Unspecified lack of coordination: Secondary | ICD-10-CM | POA: Diagnosis not present

## 2020-04-11 DIAGNOSIS — U071 COVID-19: Secondary | ICD-10-CM | POA: Diagnosis not present

## 2020-04-11 DIAGNOSIS — F0150 Vascular dementia without behavioral disturbance: Secondary | ICD-10-CM | POA: Diagnosis not present

## 2020-04-11 DIAGNOSIS — N1832 Chronic kidney disease, stage 3b: Secondary | ICD-10-CM | POA: Diagnosis not present

## 2020-04-11 DIAGNOSIS — R001 Bradycardia, unspecified: Secondary | ICD-10-CM | POA: Diagnosis not present

## 2020-04-11 DIAGNOSIS — J80 Acute respiratory distress syndrome: Secondary | ICD-10-CM | POA: Diagnosis not present

## 2020-04-11 DIAGNOSIS — Z743 Need for continuous supervision: Secondary | ICD-10-CM | POA: Diagnosis not present

## 2020-04-11 DIAGNOSIS — R531 Weakness: Secondary | ICD-10-CM | POA: Diagnosis not present

## 2020-04-11 LAB — CBC WITH DIFFERENTIAL/PLATELET
Abs Immature Granulocytes: 0.1 10*3/uL — ABNORMAL HIGH (ref 0.00–0.07)
Basophils Absolute: 0 10*3/uL (ref 0.0–0.1)
Basophils Relative: 0 %
Eosinophils Absolute: 0.2 10*3/uL (ref 0.0–0.5)
Eosinophils Relative: 4 %
HCT: 30.3 % — ABNORMAL LOW (ref 36.0–46.0)
Hemoglobin: 9.5 g/dL — ABNORMAL LOW (ref 12.0–15.0)
Immature Granulocytes: 2 %
Lymphocytes Relative: 35 %
Lymphs Abs: 1.9 10*3/uL (ref 0.7–4.0)
MCH: 31 pg (ref 26.0–34.0)
MCHC: 31.4 g/dL (ref 30.0–36.0)
MCV: 99 fL (ref 80.0–100.0)
Monocytes Absolute: 0.7 10*3/uL (ref 0.1–1.0)
Monocytes Relative: 13 %
Neutro Abs: 2.5 10*3/uL (ref 1.7–7.7)
Neutrophils Relative %: 46 %
Platelets: 151 10*3/uL (ref 150–400)
RBC: 3.06 MIL/uL — ABNORMAL LOW (ref 3.87–5.11)
RDW: 12.3 % (ref 11.5–15.5)
WBC: 5.5 10*3/uL (ref 4.0–10.5)
nRBC: 0 % (ref 0.0–0.2)

## 2020-04-11 LAB — COMPREHENSIVE METABOLIC PANEL
ALT: 10 U/L (ref 0–44)
AST: 17 U/L (ref 15–41)
Albumin: 3.1 g/dL — ABNORMAL LOW (ref 3.5–5.0)
Alkaline Phosphatase: 59 U/L (ref 38–126)
Anion gap: 7 (ref 5–15)
BUN: 31 mg/dL — ABNORMAL HIGH (ref 8–23)
CO2: 23 mmol/L (ref 22–32)
Calcium: 8.6 mg/dL — ABNORMAL LOW (ref 8.9–10.3)
Chloride: 109 mmol/L (ref 98–111)
Creatinine, Ser: 1.34 mg/dL — ABNORMAL HIGH (ref 0.44–1.00)
GFR, Estimated: 39 mL/min — ABNORMAL LOW (ref >60)
Glucose, Bld: 116 mg/dL — ABNORMAL HIGH (ref 70–99)
Potassium: 4.3 mmol/L (ref 3.5–5.1)
Sodium: 139 mmol/L (ref 135–145)
Total Bilirubin: 0.3 mg/dL (ref 0.3–1.2)
Total Protein: 5.9 g/dL — ABNORMAL LOW (ref 6.5–8.1)

## 2020-04-11 LAB — GLUCOSE, CAPILLARY
Glucose-Capillary: 103 mg/dL — ABNORMAL HIGH (ref 70–99)
Glucose-Capillary: 184 mg/dL — ABNORMAL HIGH (ref 70–99)

## 2020-04-11 LAB — D-DIMER, QUANTITATIVE: D-Dimer, Quant: 2.13 ug/mL-FEU — ABNORMAL HIGH (ref 0.00–0.50)

## 2020-04-11 LAB — C-REACTIVE PROTEIN: CRP: 1.5 mg/dL — ABNORMAL HIGH (ref <1.0)

## 2020-04-11 MED ORDER — ONDANSETRON HCL 4 MG PO TABS: 4.0000 mg | ORAL_TABLET | Freq: Four times a day (QID) | ORAL | Status: DC | PRN

## 2020-04-11 MED ORDER — ENSURE ENLIVE PO LIQD: 237.0000 mL | Freq: Two times a day (BID) | ORAL | Status: DC

## 2020-04-11 NOTE — Discharge Instructions (Signed)
Acute Kidney Injury, Adult  Acute kidney injury is a sudden worsening of kidney function. The kidneys are organs that have several jobs. They filter the blood to remove waste products and extra fluid. They also maintain a healthy balance of minerals and hormones in the body, which helps control blood pressure and keep bones strong. With this condition, your kidneys do not do their jobs as well as they should. This condition ranges from mild to severe. Over time, it may develop into long-lasting (chronic) kidney disease. Early detection and treatment may prevent acute kidney injury from developing into a chronic condition. What are the causes? Common causes of this condition include:  A problem with blood flow to the kidneys. This may be caused by: ? Low blood pressure (hypotension) or shock. ? Blood loss. ? Heart and blood vessel (cardiovascular) disease. ? Severe burns. ? Liver disease.  Direct damage to the kidneys. This may be caused by: ? Certain medicines. ? A kidney infection. ? Poisoning. ? Being around or in contact with toxic substances. ? A surgical wound. ? A hard, direct hit to the kidney area.  A sudden blockage of urine flow. This may be caused by: ? Cancer. ? Kidney stones. ? An enlarged prostate in males. What increases the risk? You are more likely to develop this condition if you:  Are older than age 65.  Are female.  Are hospitalized, especially if you are in critical condition.  Have certain conditions, such as: ? Chronic kidney disease. ? Diabetes. ? Coronary artery disease and heart failure. ? Pulmonary disease. ? Chronic liver disease. What are the signs or symptoms? Symptoms of this condition may not be obvious until the condition becomes severe. Symptoms of this condition can include:  Tiredness (lethargy) or difficulty staying awake.  Nausea or vomiting.  Swelling (edema) of the face, legs, ankles, or feet.  Problems with urination, such  as: ? Pain in the abdomen, or pain along the side of your stomach (flank). ? Producing little or no urine. ? Passing urine with a weak flow.  Muscle twitches and cramps, especially in the legs.  Confusion or trouble concentrating.  Loss of appetite.  Fever. How is this diagnosed? Your health care provider can diagnose this condition based on your symptoms, medical history, and a physical exam.  You may also have other tests, such as:  Blood tests.  Urine tests.  Imaging tests.  A test in which a sample of tissue is removed from the kidneys to be examined under a microscope (kidney biopsy). How is this treated? Treatment for this condition depends on the cause and how severe the condition is. In mild cases, treatment may not be needed. The kidneys may heal on their own. In more severe cases, treatment will involve:  Treating the cause of the kidney injury. This may involve changing any medicines you are taking or adjusting your dosage.  Fluids. You may need specialized IV fluids to balance your body's needs.  Having a catheter placed to drain urine and prevent blockages.  Preventing problems from occurring. This may mean avoiding certain medicines or procedures that can cause further injury to the kidneys. In some cases, treatment may also require:  A procedure to remove toxic wastes from the body (dialysis or continuous renal replacement therapy, CRRT).  Surgery. This may be done to repair a torn kidney or to remove the blockage from the urinary system. Follow these instructions at home: Medicines  Take over-the-counter and prescription medicines only as   told by your health care provider.  Do not take any new medicines without your health care provider's approval. Many medicines can worsen your kidney damage.  Do not take any vitamin and mineral supplements without your health care provider's approval. Many nutritional supplements can worsen your kidney  damage. Lifestyle  If your health care provider prescribed changes to your diet, follow them. You may need to decrease the amount of protein you eat.  Achieve and maintain a healthy weight. If you need help with this, ask your health care provider.  Start or continue an exercise plan. Try to exercise at least 30 minutes a day, 5 days a week.  Do not use any products that contain nicotine or tobacco, such as cigarettes, e-cigarettes, and chewing tobacco. If you need help quitting, ask your health care provider.   General instructions  Keep track of your blood pressure. Report changes in your blood pressure as told by your health care provider.  Stay up to date with your vaccines. Ask your health care provider which vaccines you need.  Keep all follow-up visits as told by your health care provider. This is important.   Where to find more information  American Association of Kidney Patients: BombTimer.gl  National Kidney Foundation: www.kidney.[place]: https://mathis.com/  Life Options [facility]: ? www.lifeoptions.org ? www.kidneyschool.org Contact a health care provider if:  Your symptoms get worse.  You develop new symptoms. Get help right away if:  You develop symptoms of worsening kidney disease, which include: ? Headaches. ? Abnormally dark or light skin. ? Easy bruising. ? Frequent hiccups. ? Chest pain. ? Shortness of breath. ? End of menstruation in women. ? Seizures. ? Confusion or altered mental status. ? Abdominal or back pain. ? Itchiness.  You have a fever.  Your body is producing less urine.  You have pain or bleeding when you urinate. Summary  Acute kidney injury is a sudden worsening of kidney function.  Acute kidney injury can be caused by problems with blood flow to the kidneys, direct damage to the kidneys, and sudden blockage of urine flow.  Symptoms of this condition may not be obvious until it becomes severe.  Symptoms may include edema, lethargy, confusion, nausea or vomiting, and problems passing urine.  This condition can be diagnosed with blood tests, urine tests, and imaging tests. Sometimes a kidney biopsy is done to diagnose this condition.  Treatment for this condition often involves treating the underlying cause. It is treated with fluids, medicines, diet changes, dialysis, or surgery. This information is not intended to replace advice given to you by your health care provider. Make sure you discuss any questions you have with your health care provider. Document Revised: 01/10/2019 Document Reviewed: 01/10/2019 Elsevier Patient Education  2021 [place].   Acute Urinary Retention, Female  Acute urinary retention is a condition in which a person is unable to pass urine or can only pass a little urine. This condition can happen suddenly and last for a short time. If left untreated, it can become long-term (chronic) and result in kidney damage or other serious complications. What are the causes? This condition may be caused by:  Obstruction or narrowing of the tube that drains the bladder (urethra). This may be caused by surgery, problems with nearby organs, or injury to the bladder or urethra.  Problems with the nerves in the bladder.  Pelvic organ prolapse.  Tumors in the area of the pelvis, bladder, or urethra.  Vaginal childbirth.  Bladder or urinary tract infection.  Constipation.  Certain medicines. What increases the risk? This condition is more likely to develop in women over age 41. Other chronic health conditions can increase the risk of acute urinary retention. These include:  Diseases such as multiple sclerosis.  Spinal cord injuries.  Diabetes.  Degenerative cognitive conditions, such as delirium or dementia.  Psychological conditions. A woman may hold her urine due to trauma or because she does not want to use the bathroom.  History of preexisting urinary  retention.  History of prior pelvic surgery, incontinence surgery, or radical pelvic surgery. What are the signs or symptoms? Symptoms of this condition include:  Trouble urinating.  Pain in the lower abdomen. How is this diagnosed? This condition is diagnosed based on a physical exam and your medical history. You may also have other tests, including:  An ultrasound of the bladder or kidneys or both.  Blood tests.  A urine analysis.  Additional tests may be needed, such as a CT scan, MRI, and kidney or bladder function tests. How is this treated? Treatment for this condition may include:  Medicines.  Placing a thin, sterile tube (catheter) into the bladder to drain urine out of the body. This is called an indwelling urinary catheter. After it is inserted, the catheter is held in place with a small balloon that is filled with sterile water. Urine drains from the catheter into a collection bag outside of the body.  Behavioral therapy.  Treatment for other conditions. If needed, you may be treated in the hospital for kidney function problems or to manage other complications. Follow these instructions at home: Medicines  Take over-the-counter and prescription medicines only as told by your health care provider. Avoid certain medicines, such as decongestants, antihistamines, and some prescription medicines. Do not take any medicine unless your health care provider approves.  If you were prescribed an antibiotic medicine, take it as told by your health care provider. Do not stop using the antibiotic even if you start to feel better. General instructions  Do not use any products that contain nicotine or tobacco. These products include cigarettes, chewing tobacco, and vaping devices, such as e-cigarettes. If you need help quitting, ask your health care provider.  Drink enough fluid to keep your urine pale yellow.  If you have an indwelling urinary catheter, follow the instructions  from your health care provider.  Monitor any changes in your symptoms. Tell your health care provider about any changes.  If instructed, monitor your blood pressure at home. Report changes as told by your health care provider.  Keep all follow-up visits. This is important. Contact a health care provider if:  You have uncomfortable bladder contractions that you cannot control (spasms).  You leak urine with the spasms. Get help right away if:  You have chills or a fever.  You have blood in your urine.  You have a catheter and the following happens: ? Your catheter stops draining urine. ? Your catheter falls out. Summary  Acute urinary retention is a condition in which a person is unable to pass urine or can only pass a little urine. If left untreated, this can result in kidney damage or other serious complications.  One cause of this condition may be obstruction or narrowing of the tube that drains the bladder (urethra). This may be caused by surgery, problems with nearby organs, or injury to the bladder or urethra.  Treatment may include medicines and placement of an indwelling urinary catheter.  Monitor any changes in your symptoms. Tell your health care provider about any changes. This information is not intended to replace advice given to you by your health care provider. Make sure you discuss any questions you have with your health care provider. Document Revised: 11/22/2019 Document Reviewed: 11/22/2019 Elsevier Patient Education  2021 [employer].

## 2020-04-11 NOTE — Progress Notes (Signed)
Report called to [facility] at this time.

## 2020-04-11 NOTE — Discharge Summary (Signed)
Physician Discharge Summary  VIANNE CEASAR [id number] DOB: 13-Dec-1933 DOA: 04/06/2020  PCP: Leeroy Cha, MD  Admit date: 04/06/2020 Discharge date: 04/11/2020  Admitted From: Home  Discharge disposition: Skilled nursing facility   Recommendations for Outpatient Follow-Up:   . Follow up with your primary care provider at the skilled nursing facility in 3 to 5 days . Check CBC, BMP, magnesium in the next visit . Encourage oral hydration.   Discharge Diagnosis:   Principal Problem:   AKI (acute kidney injury) ([place]) Active Problems:   Diabetes mellitus with complication ([place])   Acute encephalopathy   Dementia (HCC)   COPD (chronic obstructive pulmonary disease) (HCC)   CKD (chronic kidney disease) stage 3, GFR 30-59 ml/min   COVID-19 virus infection   Discharge Condition: Improved.  Diet recommendation: Carbohydrate-modified.   Wound care: None.  Code status: Full.   History of Present Illness:   [name] is a 87 y.o. female with a history of CKD stage III, dementia, chronic diastolic heart failure, history of colostomy presented to the hospital with decreased urinary output and mental status changes.  Patient was noted to be dehydrated with poor oral intake and was incidentally noted to have COVID-19 infection.  Patient was started on IV fluids and was admitted to hospital for further evaluation and treatment.  Hospital Course:   Following conditions were addressed during hospitalization as listed below,  AKI on CKD stage IIIb with metabolic acidosis. Improved.  Patient had creatinine of 2.4 on presentation.  Baseline creatinine of 1.7-1.8.  Received IV fluids with improvement. Latest creatinine of 1.3.  Was on Metformin at home which will be resumed on discharge  Acute metabolic encephalopathy Likely secondary to volume depletion.  Has improved at this time.  Volume depletion.  Has improved.  Encourage oral fluids on  discharge  Asymptomatic COVID-19 infection Patient is unvaccinated.  No pneumonia or hypoxia.  Mildly elevated inflammatory markers which are downtrending.  Patient did not need any specific treatment  Dementia  Palliative care team the patient during hospitalization. Increasing oral intake as per the staff.  Patient will benefit from palliative care follow-up as outpatient  Diabetes mellitus, type 2 Latest Hemoglobin A1C of 5.7% this admission.  Resume Metformin at home.  Chronic diastolic heart failure Compensated at this time.  COPD Remained compensated.  Hyperlipidemia Continue statins.  History of symptomatic bradycardia S/p pacemaker.  S/p colostomy Continue colostomy care  Disposition.  At this time, patient is stable for disposition to skilled nursing facility  Medical Consultants:    Palliative care  Procedures:    None Subjective:   Today, patient was seen and examined at bedside.  Patient feels okay today.  Alert awake and communicative.  Denies any pain, cough, fever, chills or rigor  Discharge Exam:   Vitals:   04/10/20 2118 04/11/20 0457  BP: 117/66 117/60  Pulse: 71 60  Resp: 17 17  Temp: 99.4 F (37.4 C) 98.5 F (36.9 C)  SpO2: 100% 100%   Vitals:   04/10/20 0523 04/10/20 1300 04/10/20 2118 04/11/20 0457  BP: (!) 124/51 115/61 117/66 117/60  Pulse: 62 71 71 60  Resp: '17 16 17 17  '$ Temp: 98 F (36.7 C) 97.8 F (36.6 C) 99.4 F (37.4 C) 98.5 F (36.9 C)  TempSrc: Oral Oral Oral Oral  SpO2: 98% 99% 100% 100%  Weight:      Height:       General: Alert awake, communicative,oriented to time and place,not in obvious distress  HENT: pupils equally reacting to light,  No scleral pallor or icterus noted. Oral mucosa is moist.  Chest:  Clear breath sounds.  Diminished breath sounds bilaterally. No crackles or wheezes.  CVS: S1 &S2 heard. No murmur.  Regular rate and rhythm. Abdomen: Soft, nontender, nondistended.  Bowel sounds are  heard.  Colostomy in place Extremities: No cyanosis, clubbing or edema.  Peripheral pulses are palpable. Psych: Alert, awake and communicative, normal mood CNS:  No cranial nerve deficits.  Power equal in all extremities.   Skin: Warm and dry.  No rashes noted.  The results of significant diagnostics from this hospitalization (including imaging, microbiology, ancillary and laboratory) are listed below for reference.     Diagnostic Studies:   DG Chest Portable 1 View  Result Date: 04/07/2020 CLINICAL DATA:  Cough EXAM: PORTABLE CHEST 1 VIEW COMPARISON:  05/10/2019 FINDINGS: Cardiac pacemaker. Heart size and pulmonary vascularity are normal. Probable linear atelectasis in the lung bases. No airspace disease or consolidation. No pleural effusions. No pneumothorax. Degenerative changes in the spine and shoulders. IMPRESSION: Probable linear atelectasis in the lung bases. Electronically Signed   By: Lucienne Capers M.D.   On: 04/07/2020 01:23     Labs:   Basic Metabolic Panel: Recent Labs  Lab 04/07/20 0435 04/08/20 0414 04/09/20 0413 04/10/20 0412 04/11/20 0353  NA 142 144 142 142 139  K 4.6 4.3 4.4 4.0 4.3  CL 111 114* 113* 112* 109  CO2 17* 22 19* 23 23  GLUCOSE 75 178* 128* 112* 116*  BUN 49* 32* 28* 27* 31*  CREATININE 2.29* 1.57* 1.39* 1.40* 1.34*  CALCIUM 9.0 8.6* 8.7* 8.7* 8.6*   GFR Estimated Creatinine Clearance: 26.7 mL/min (A) (by C-G formula based on SCr of 1.34 mg/dL (H)). Liver Function Tests: Recent Labs  Lab 04/07/20 0212 04/08/20 0414 04/09/20 0413 04/10/20 0412 04/11/20 0353  AST '15 18 20 19 17  '$ ALT '8 8 9 9 10  '$ ALKPHOS 55 45 49 60 59  BILITOT 1.0 0.6 0.3 0.4 0.3  PROT 6.1* 5.6* 6.1* 6.3* 5.9*  ALBUMIN 3.1* 2.9* 3.1* 3.2* 3.1*   No results for input(s): LIPASE, AMYLASE in the last 168 hours. Recent Labs  Lab 04/07/20 0210  AMMONIA 17   Coagulation profile No results for input(s): INR, PROTIME in the last 168 hours.  CBC: Recent Labs  Lab  04/06/20 1918 04/07/20 0435 04/08/20 0414 04/09/20 0413 04/10/20 0412 04/11/20 0353  WBC 4.2 3.5* 2.8* 4.2 5.0 5.5  NEUTROABS 2.2  --  1.0* 1.9 2.0 2.5  HGB 11.4* 10.5* 9.8* 10.2* 10.2* 9.5*  HCT 37.1 33.9* 31.5* 32.9* 32.9* 30.3*  MCV 100.0 100.6* 99.4 98.2 99.4 99.0  PLT 153 149* 134* 152 141* 151   Cardiac Enzymes: No results for input(s): CKTOTAL, CKMB, CKMBINDEX, TROPONINI in the last 168 hours. BNP: Invalid input(s): POCBNP CBG: Recent Labs  Lab 04/10/20 0810 04/10/20 1114 04/10/20 1626 04/10/20 2120 04/11/20 0742  GLUCAP 101* 97 128* 127* 103*   D-Dimer Recent Labs    04/10/20 0412 04/11/20 0353  DDIMER 2.94* 2.13*   Hgb A1c No results for input(s): HGBA1C in the last 72 hours. Lipid Profile No results for input(s): CHOL, HDL, LDLCALC, TRIG, CHOLHDL, LDLDIRECT in the last 72 hours. Thyroid function studies No results for input(s): TSH, T4TOTAL, T3FREE, THYROIDAB in the last 72 hours.  Invalid input(s): FREET3 Anemia work up No results for input(s): VITAMINB12, FOLATE, FERRITIN, TIBC, IRON, RETICCTPCT in the last 72 hours. Microbiology Recent Results (from the  past 240 hour(s))  Urine Culture     Status: None   Collection Time: 04/06/20  9:36 PM   Specimen: Urine, Random  Result Value Ref Range Status   Specimen Description   Final    URINE, RANDOM Performed at [facility] [address], [place], [place] [address]    Special Requests   Final    NONE Performed at [facility], [place] [address] [place], [place] [address]    Culture   Final    NO GROWTH Performed at [facility], [place] [address], [place], [place] [address]    Report Status 04/08/2020 FINAL  Final  SARS CORONAVIRUS 2 (TAT 6-24 HRS) Nasopharyngeal Nasopharyngeal Swab     Status: Abnormal   Collection Time: 04/07/20  1:04 AM   Specimen: Nasopharyngeal Swab  Result Value Ref Range Status   SARS Coronavirus 2 POSITIVE (A) NEGATIVE Final     Comment: (NOTE) SARS-CoV-2 target nucleic acids are DETECTED.  The SARS-CoV-2 RNA is generally detectable in upper and lower respiratory specimens during the acute phase of infection. Positive results are indicative of the presence of SARS-CoV-2 RNA. Clinical correlation with patient history and other diagnostic information is  necessary to determine patient infection status. Positive results do not rule out bacterial infection or co-infection with other viruses.  The expected result is Negative.  Fact Sheet for Patients: SugarRoll.be  Fact Sheet for Healthcare Providers: https://www.woods-mathews.com/  This test is not yet approved or cleared by the [place] FDA and  has been authorized for detection and/or diagnosis of SARS-CoV-2 by FDA under an Emergency Use Authorization (EUA). This EUA will remain  in effect (meaning this test can be used) for the duration of the COVID-19 declaration under Section 564(b)(1) of the Act, 21 U. S.C. section 360bbb-3(b)(1), unless the authorization is terminated or revoked sooner.   Performed at [facility], [place] [address] [place], [place] [address]      Discharge Instructions:   Discharge Instructions    Call MD for:  persistant nausea and vomiting   Complete by: As directed    Call MD for:  severe uncontrolled pain   Complete by: As directed    Call MD for:  temperature >100.4   Complete by: As directed    Diet Carb Modified   Complete by: As directed    Discharge instructions   Complete by: As directed    Follow-up with your primary care provider at the skilled nursing facility in 3 to 5 days. increase fluid intake.  Check blood work in the next visit.   Increase activity slowly   Complete by: As directed      Allergies as of 04/11/2020      Reactions   Aspirin Other (See Comments)   Triggers asthma      Medication List    TAKE these medications   acetaminophen 500 MG  tablet Commonly known as: TYLENOL Take 500 mg by mouth daily as needed for fever or headache (pain).   albuterol 108 (90 Base) MCG/ACT inhaler Commonly known as: VENTOLIN HFA Inhale 1-2 puffs into the lungs every 4 (four) hours as needed for wheezing or shortness of breath.   alendronate 70 MG tablet Commonly known as: FOSAMAX Take 70 mg by mouth every Sunday.   busPIRone 5 MG tablet Commonly known as: BUSPAR Take 5 mg by mouth 3 (three) times daily.   CALCIUM 600-D PO Take 1 tablet by mouth every morning.  donepezil 10 MG tablet Commonly known as: ARICEPT Take 10 mg by mouth at bedtime.   escitalopram 10 MG tablet Commonly known as: LEXAPRO Take 10 mg by mouth every evening.   feeding supplement Liqd Take 237 mLs by mouth 2 (two) times daily between meals.   Flovent HFA 220 MCG/ACT inhaler Generic drug: fluticasone Inhale 1 puff into the lungs 2 (two) times daily. What changed:   when to take this  reasons to take this   memantine 28 MG Cp24 24 hr capsule Commonly known as: NAMENDA XR Take 28 mg by mouth every morning.   metFORMIN 500 MG 24 hr tablet Commonly known as: GLUCOPHAGE-XR Take 500 mg by mouth daily with supper.   montelukast 10 MG tablet Commonly known as: SINGULAIR Take 10 mg by mouth at bedtime.   ondansetron 4 MG tablet Commonly known as: ZOFRAN Take 1 tablet (4 mg total) by mouth every 6 (six) hours as needed for nausea.   pravastatin 10 MG tablet Commonly known as: PRAVACHOL Take 10 mg by mouth at bedtime.       Contact information for after-discharge care    Destination    [facility] Preferred SNF .   Service: Skilled Nursing Contact information: [id number] [place] [phone]                   Time coordinating discharge: 39 minutes  Signed:  Rigby Swamy  [facility] 04/11/2020, 11:27 AM

## 2020-04-11 NOTE — TOC Transition Note (Signed)
Transition of Care [facility]) - CM/SW Discharge Note   Patient Details  Name: [name] MRN: [id number] Date of Birth: [birth year]  [facility] CM/SW Contact:  Trish Mage, LCSW Phone Number: 04/11/2020, 10:34 AM   Clinical Narrative:   Patient who is stable for discharge today will transfer to [facility], confirmed by [place].  Family alerted and told they would need to sign her in. PTAR arranged. Insurance authorization reference number is [id number]. Authorization [id number]  5 days  Review again on the 31st with Ariste.  Nursing, please call report to [phone]. Room 310.  PTAR called at 1:25.  TOC sign off.    Final next level of care: Skilled Nursing Facility Barriers to Discharge: Barriers Resolved   Patient Goals and CMS Choice     Choice offered to / list presented to : Adult Children  Discharge Placement                       Discharge Plan and Services   Discharge Planning Services: CM Consult Post Acute Care Choice: [facility]                               Social Determinants of Health (SDOH) Interventions     Readmission Risk Interventions No flowsheet data found.

## 2020-04-11 NOTE — Plan of Care (Signed)
  Problem: Health Behavior/Discharge Planning: Goal: Ability to manage health-related needs will improve Outcome: Adequate for Discharge   Problem: Clinical Measurements: Goal: Will remain free from infection Outcome: Adequate for Discharge Goal: Diagnostic test results will improve Outcome: Adequate for Discharge Goal: Respiratory complications will improve Outcome: Adequate for Discharge   Problem: Activity: Goal: Risk for activity intolerance will decrease Outcome: Adequate for Discharge   Problem: Education: Goal: Knowledge of risk factors and measures for prevention of condition will improve Outcome: Adequate for Discharge   Problem: Coping: Goal: Psychosocial and spiritual needs will be supported Outcome: Adequate for Discharge   Problem: Respiratory: Goal: Will maintain a patent airway Outcome: Adequate for Discharge

## 2020-04-12 ENCOUNTER — Non-Acute Institutional Stay (SKILLED_NURSING_FACILITY): Payer: Medicare Other | Admitting: Internal Medicine

## 2020-04-12 ENCOUNTER — Encounter: Payer: Self-pay | Admitting: Internal Medicine

## 2020-04-12 DIAGNOSIS — U071 COVID-19: Secondary | ICD-10-CM | POA: Diagnosis not present

## 2020-04-12 DIAGNOSIS — N179 Acute kidney failure, unspecified: Secondary | ICD-10-CM | POA: Diagnosis not present

## 2020-04-12 DIAGNOSIS — G9340 Encephalopathy, unspecified: Secondary | ICD-10-CM

## 2020-04-12 NOTE — Assessment & Plan Note (Signed)
BMET reveals CKD Stage  Medication list reviewed ; no nephrotoxic meds @ present dosage (Metformin 500 mg qd) identified. Metformin will be weaned / discontinued if CKD progresses

## 2020-04-12 NOTE — Progress Notes (Signed)
NURSING HOME LOCATION:  [facility] ROOM NUMBER:  [id number]  CODE STATUS:  FULL CODE  PCP:  Leeroy Cha, MD  [address] [place] [address]  This is a comprehensive admission note to [facility] performed on this date less than 30 days from date of admission. Included are preadmission medical/surgical history; reconciled medication list; family history; social history and comprehensive review of systems.  Corrections and additions to the records were documented. Comprehensive physical exam was also performed. Additionally a clinical summary was entered for each active diagnosis pertinent to this admission in the Problem List to enhance continuity of care.  HPI: She was hospitalized 1/22-1/27/2022, admitted with decreased urinary output and AMS.  Clinically she was dehydrated and oral intake had been poor.  Incidental finding was COVID-19 infection.  IV fluids were initiated and she was admitted for definitive treatment Creatinine was 3.4 on presentation; baseline creatinine is felt to be 1.7-1.8.  With rehydration most recent creatinine was 1.3.  Metformin which has been held during hospitalization was resumed at discharge.  A1c was 5.7% indicating prediabetes. The acute metabolic encephalopathy was most likely related to volume depletion; this had clinically improved. The patient is unvaccinated for Covid.  Chest x-ray suggested linear atelectasis at the lung bases without infiltrate.  Inflammatory markers were elevated but were downtrending.  No specific treatment was initiated.  D-dimer peaked at 2.94 and C-reactive protein at 3.  The latter was 1.5 at discharge. Palliative care team saw the patient during hospitalization to assess her prognosis in the context of dementia. She was discharged to the SNF for rehab.  Past medical and surgical history: Includes chronic diastolic heart failure, COPD, dyslipidemia, diabetes with neuropathy, CKD stage III,  history of asthma and history of symptomatic bradycardia for which she received a pacemaker.  Other procedures and surgeries include TAH, lumbar fusion, and colostomy in 2012 for SBO.  Social history: Nondrinker, never smoked.  Family history: Extensive history reviewed.  It is noncontributory due to her advanced age.   Review of systems:  Could not be completed due to dementia. Date given as "August ?,  19??".  She could not tell me why she had been in the hospital , surmising "got sick is all I know".  Review of systems was totally negative with special reference to Covid symptoms.  Constitutional: No fever, significant weight change, fatigue  Eyes: No redness, discharge, pain, vision change ENT/mouth: No nasal congestion, purulent discharge, earache, change in hearing, sore throat  Cardiovascular: No chest pain, palpitations, paroxysmal nocturnal dyspnea, claudication, edema  Respiratory: No cough, sputum production, hemoptysis, DOE, significant snoring, apnea Gastrointestinal: No heartburn, dysphagia, abdominal pain, nausea /vomiting, rectal bleeding, melena, change in bowels Genitourinary: No dysuria, hematuria, pyuria, incontinence, nocturia Musculoskeletal: No joint stiffness, joint swelling, weakness, pain Dermatologic: No rash, pruritus, change in appearance of skin Neurologic: No dizziness, headache, syncope, seizures, numbness, tingling Psychiatric: No significant anxiety, depression, insomnia, anorexia Endocrine: No change in hair/skin/nails, excessive thirst, excessive hunger, excessive urination  Hematologic/lymphatic: No significant bruising, lymphadenopathy, abnormal bleeding Allergy/immunology: No itchy/watery eyes, significant sneezing, urticaria, angioedema  Physical exam:  Pertinent or positive findings: She appears younger than 75.Facies tend to be blank.  There is significant ptosis on the right which gives the left eye a "wide-eyed" appearance in comparison. Dentition is  extremely poor with caries to the gumline, staining and fracturing of teeth.  Grade 1/2 systolic murmur is suggested at the right base.  Chest was surprisingly clear.  Abdomen is protuberant.  Pedal pulses are decreased.  Strength to opposition is fair-good.  Her nails are very closely trimmed.  General appearance: Adequately nourished; no acute distress, increased work of breathing is present.   Lymphatic: No lymphadenopathy about the head, neck, axilla. Eyes: No conjunctival inflammation or lid edema is present. There is no scleral icterus. Ears:  External ear exam shows no significant lesions or deformities.   Nose:  External nasal examination shows no deformity or inflammation. Nasal mucosa are pink and moist without lesions, exudates Neck:  No thyromegaly, masses, tenderness noted.    Heart:  Normal rate and regular rhythm. S1 and S2 normal without gallop, click, rub.  Lungs:  without wheezes, rhonchi, rales, rubs. Abdomen: Bowel sounds are normal.  Abdomen is soft and nontender with no organomegaly, hernias, masses. GU: Deferred  Extremities:  No cyanosis, clubbing, edema. Neurologic exam: Balance, Rhomberg, finger to nose testing could not be completed due to clinical state Skin: Warm & dry w/o tenting. No significant lesions or rash.  See clinical summary under each active problem in the Problem List with associated updated therapeutic plan

## 2020-04-12 NOTE — Patient Instructions (Signed)
See assessment and plan under each diagnosis in the problem list and acutely for this visit 

## 2020-04-13 NOTE — Assessment & Plan Note (Signed)
Although called "incidental" ; Covid quite likely resulted in dehydration & AKI. Her dementia precludes adequate history as to any actual symptoms.

## 2020-04-13 NOTE — Assessment & Plan Note (Signed)
See 1/28 exam; oriented only to self

## 2020-04-19 ENCOUNTER — Non-Acute Institutional Stay (SKILLED_NURSING_FACILITY): Payer: Medicare Other | Admitting: Adult Health

## 2020-04-19 ENCOUNTER — Encounter: Payer: Self-pay | Admitting: Adult Health

## 2020-04-19 DIAGNOSIS — F0150 Vascular dementia without behavioral disturbance: Secondary | ICD-10-CM

## 2020-04-19 DIAGNOSIS — F329 Major depressive disorder, single episode, unspecified: Secondary | ICD-10-CM

## 2020-04-19 DIAGNOSIS — E118 Type 2 diabetes mellitus with unspecified complications: Secondary | ICD-10-CM | POA: Diagnosis not present

## 2020-04-19 DIAGNOSIS — U071 COVID-19: Secondary | ICD-10-CM | POA: Diagnosis not present

## 2020-04-19 NOTE — Progress Notes (Addendum)
Location:  [facility] Room Number: 310-B Place of Service:  SNF (31) Provider:  Durenda Age, DNP, FNP-BC  Patient Care Team: Leeroy Cha, MD as PCP - General (Internal Medicine) Skeet Latch, MD as PCP - Cardiology (Cardiology) Evans Lance, MD as PCP - Electrophysiology (Cardiology)  Extended Emergency Contact Information Primary Emergency Contact: August Albino of [place] Phone: [phone] Mobile Phone: [phone] Relation: Granddaughter  Code Status:  FULL CODE  Goals of care: Advanced Directive information Advanced Directives 04/08/2020  Does Patient Have a Medical Advance Directive? Yes  Type of Advance Directive [place]  Does patient want to make changes to medical advance directive? No - Patient declined  Copy of [place] in Chart? No - copy requested  Would patient like information on creating a medical advance directive? -  Pre-existing out of facility DNR order (yellow form or pink MOST form) -     Chief Complaint  Patient presents with  . Acute Visit    Routine short-term rehabilitation visit    HPI:  Pt is an 87 y.o. female seen today for a short-term rehabilitation visit. 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 No noted CBGs.. She currently takes Metformin 500 mg daily for diabetes mellitus. No reported SOB. He takes Flovent HFA 220 mcg inhaler inhale 2 puffs into the lungs twice daily for COPD.  She was admitted to [facility]. On 04/11/2020 post hospitalization 04/04/20 to 04/11/2020 for AKI. She presented to the hospital with decreased urinary output and mental status changes. She was noted to be dehydrated with poor oral intake and was incidentally  noted to have COVID-19 infection. She was given IV fluids and did not need any specific treatment for COVID-19.  She was asymptomatic and unvaccinated.  Past Medical History:  Diagnosis Date  . Asthma   . CKD (chronic kidney disease) stage 3, GFR 30-59 ml/min ([place]) 05/01/2018  . Colostomy present ([place])    hx/notes 07/17/2010  . Dementia ([place])   . Diabetic neuropathy ([place])   . Hypertension    hx/notes 07/17/2010  . Pneumonia    recent/notes 10/08/2016  . Symptomatic bradycardia 12/15/2017  . Type II diabetes mellitus ([place])    hx/notes 07/17/2010   Past Surgical History:  Procedure Laterality Date  . ABDOMINAL HYSTERECTOMY    . COLOSTOMY     S/P SBO hx/notes 07/17/2010  . LUMBAR FUSION  11/2006   hx/notes 07/17/2010  . NASAL SINUS SURGERY  04/28/2002   Bilateral inferior turbinate reductions, bilateral maxillary antrotomies, bilateral total ethmoidectomies, bilateral frontal recess explorations, bilateral sphenoidotomies, Instatrac guidance./notes 07/28/2010  . PACEMAKER IMPLANT N/A 05/02/2018   Procedure: PACEMAKER IMPLANT;  Surgeon: Evans Lance, MD;  Location: [facility];  Service: Cardiovascular;  Laterality: N/A;    Allergies  Allergen Reactions  . Aspirin Other (See Comments)    Triggers asthma    Outpatient Encounter Medications as of 04/19/2020  Medication Sig  . acetaminophen (TYLENOL) 500 MG tablet Take 500 mg by mouth daily as needed for fever or headache (pain).  Marland Kitchen albuterol (PROVENTIL HFA;VENTOLIN HFA) 108 (90 Base) MCG/ACT inhaler Inhale 2 puffs into the lungs every 4 (four) hours as needed for wheezing or shortness of breath.  Marland Kitchen alendronate (FOSAMAX) 70 MG tablet Take 70 mg by mouth every Sunday.  Marland Kitchen  busPIRone (BUSPAR) 5 MG tablet Take 5 mg by mouth 3 (three) times daily.  . Calcium Carb-Cholecalciferol (CALCIUM 600-D PO) Take 1 tablet by mouth every morning.  . donepezil (ARICEPT) 10 MG tablet Take 10 mg by mouth at bedtime.   Marland Kitchen escitalopram (LEXAPRO) 10 MG tablet  Take 10 mg by mouth every evening.  . feeding supplement (ENSURE ENLIVE / ENSURE PLUS) LIQD Take 237 mLs by mouth 2 (two) times daily between meals.  Marland Kitchen FLOVENT HFA 220 MCG/ACT inhaler Inhale 1 puff into the lungs 2 (two) times daily.  . memantine (NAMENDA XR) 28 MG CP24 24 hr capsule Take 28 mg by mouth every morning.  . metFORMIN (GLUCOPHAGE-XR) 500 MG 24 hr tablet Take 500 mg by mouth daily with supper.  . montelukast (SINGULAIR) 10 MG tablet Take 10 mg by mouth at bedtime.   . ondansetron (ZOFRAN) 4 MG tablet Take 1 tablet (4 mg total) by mouth every 6 (six) hours as needed for nausea.  . pravastatin (PRAVACHOL) 10 MG tablet Take 10 mg by mouth at bedtime.   No facility-administered encounter medications on file as of 04/19/2020.    Review of Systems  GENERAL: No change in appetite, no fatigue, no weight changes, no fever, chills or weakness MOUTH and THROAT: Denies oral discomfort, gingival pain or bleeding RESPIRATORY: no cough, SOB, DOE, wheezing, hemoptysis CARDIAC: No chest pain, edema or palpitations GI: No abdominal pain, diarrhea, constipation, heart burn, nausea or vomiting GU: Denies dysuria, frequency, hematuria, incontinence, or discharge NEUROLOGICAL: Denies dizziness, syncope, numbness, or headache PSYCHIATRIC: Denies feelings of depression or anxiety. No report of hallucinations, insomnia, paranoia, or agitation   There is no immunization history on file for this patient. Pertinent  Health Maintenance Due  Topic Date Due  . FOOT EXAM  Never done  . OPHTHALMOLOGY EXAM  Never done  . URINE MICROALBUMIN  Never done  . PNA vac Low Risk Adult (1 of 2 - PCV13) Never done  . INFLUENZA VACCINE  Never done  . HEMOGLOBIN A1C  10/05/2020  . DEXA SCAN  Completed   Fall Risk  11/10/2018 07/28/2017  Falls in the past year? 1 No  Number falls in past yr: 1 -  Injury with Fall? 1 -  Comment Hit head on dresser when passed out -  Risk for fall due to : History of fall(s);Impaired  balance/gait;Impaired mobility -  Follow up Falls evaluation completed;Education provided;Falls prevention discussed -     Vitals:   04/19/20 1538  Weight: 123 lb 6.4 oz (56 kg)  Height: '5\' 4"'$  (1.626 m)   Body mass index is 21.18 kg/m.  Physical Exam  GENERAL APPEARANCE: Well nourished. In no acute distress. Normal body habitus SKIN:  Skin is warm and dry.  MOUTH and THROAT: Lips are without lesions. Oral mucosa is moist and without lesions. Tongue is normal in shape, size, and color  RESPIRATORY: Breathing is even & unlabored, BS CTAB CARDIAC: RRR, no murmur,no extra heart sounds, no edema GI: Abdomen soft, normal BS, no masses, no tenderness. +colostomy NEUROLOGICAL: There is no tremor. Speech is clear. Alert to self, disoriented to time and place. PSYCHIATRIC: Alert and oriented X 3. Affect and behavior are appropriate  Labs reviewed: Recent Labs    04/09/20 0413 04/10/20 0412 04/11/20 0353  NA 142 142 139  K 4.4 4.0 4.3  CL 113* 112* 109  CO2 19* 23 23  GLUCOSE 128* 112* 116*  BUN 28* 27* 31*  CREATININE 1.39* 1.40* 1.34*  CALCIUM 8.7* 8.7* 8.6*   Recent Labs    04/09/20 0413 04/10/20 0412 04/11/20 0353  AST '20 19 17  '$ ALT '9 9 10  '$ ALKPHOS 49 60 59  BILITOT 0.3 0.4 0.3  PROT 6.1* 6.3* 5.9*  ALBUMIN 3.1* 3.2* 3.1*   Recent Labs    04/09/20 0413 04/10/20 0412 04/11/20 0353  WBC 4.2 5.0 5.5  NEUTROABS 1.9 2.0 2.5  HGB 10.2* 10.2* 9.5*  HCT 32.9* 32.9* 30.3*  MCV 98.2 99.4 99.0  PLT 152 141* 151   Lab Results  Component Value Date   TSH 2.556 04/07/2020   Lab Results  Component Value Date   HGBA1C 5.7 (H) 04/07/2020   Significant Diagnostic Results in last 30 days:  DG Chest Portable 1 View  Result Date: 04/07/2020 CLINICAL DATA:  Cough EXAM: PORTABLE CHEST 1 VIEW COMPARISON:  05/10/2019 FINDINGS: Cardiac pacemaker. Heart size and pulmonary vascularity are normal. Probable linear atelectasis in the lung bases. No airspace disease or  consolidation. No pleural effusions. No pneumothorax. Degenerative changes in the spine and shoulders. IMPRESSION: Probable linear atelectasis in the lung bases. Electronically Signed   By: Lucienne Capers M.D.   On: 04/07/2020 01:23    Assessment/Plan  1. Diabetes mellitus with complication [facility]) Lab Results  Component Value Date   HGBA1C 5.7 (H) 04/07/2020   -  CBG checks daily -   Continue Metformin  2. COVID-19 virus infection -Currently on COVID-19 isolation unit -No fever, SOB nor chills  3. Major depression, chronic -   Mood is a stable, continue Lexapro, Singulair and buspirone  4. Vascular dementia without behavioral disturbance ([place]) -    BIMS score 4/15, ranging in severe cognitive impairment -    Continue Aricept and memantine    Family/ staff Communication: Discussed plan of care with resident and charge nurse.  Labs/tests ordered: None  Goals of care:   Short-term care   Durenda Age, DNP, MSN, FNP-BC [facility] and Adult Medicine [phone] (Monday-Friday 8:00 a.m. - 5:00 p.m.) [phone] (after hours)

## 2020-04-22 ENCOUNTER — Other Ambulatory Visit: Payer: Self-pay

## 2020-04-22 ENCOUNTER — Non-Acute Institutional Stay: Payer: Medicare Other | Admitting: Hospice

## 2020-04-22 DIAGNOSIS — Z515 Encounter for palliative care: Secondary | ICD-10-CM

## 2020-04-22 DIAGNOSIS — U071 COVID-19: Secondary | ICD-10-CM | POA: Diagnosis not present

## 2020-04-22 NOTE — Progress Notes (Signed)
[facility] Consult Note Telephone: [phone]  Fax: [phone]  PATIENT NAME: [name] [address] [place] [address] [phone] (home)  DOB: Jun 18, 1933 MRN: [id number]  PRIMARY CARE PROVIDER:    Leeroy Cha, MD,  [place]. [address] [place] [address] [phone]  REFERRING PROVIDER:   Dr Newt Lukes RESPONSIBLE PARTY:   Extended Emergency Contact Information Primary Emergency Contact: Tamlyn, [place] Phone: [phone] Mobile Phone: [phone] Relation: Granddaughter  TELEHEALTH VISIT STATEMENT Due to the COVID-19 crisis, this visit was done via telephone from my office. It was initiated and consented to by this patient and/or family.   CHIEF COMPLAIN: Initial palliative care visit/COVID-19 Viral infection  Visit at the request of Dr. Newt Lukes  for palliative consult.  Visit is to build trust and highlight Palliative Medicine as specialized medical care for people living with serious illness, aimed at facilitating better quality of life through symptoms relief, assisting with advance care plan and establishing goals of care. Patient endorsed palliative service. NP Called [name] and left her a voicemail with call back number.  Discussion on the difference between Palliative and Hospice care.  Visit consisted of counseling and education dealing with the complex and emotionally intense issues of symptom management and palliative care in the setting of serious and potentially life-threatening illness. Palliative care team will continue to support patient, patient's family, and medical team.  RECOMMENDATIONS/PLAN:   Advance Care Planning: Our advance care planning conversation included a discussion about  the value and importance of advance care planning, exploration of goals of care in the event of a sudden injury or illness, identification and preparation of a  healthcare agent and review and updating or creation of an advance directive document. Patient deferred further discussion on her code status to [facility], saying she trusts whatever decision ons [name] makes.   CODE STATUS: Full code  GOALS OF CARE: Goals of care include to maximize quality of life and symptom management.   I spent 46  minutes providing this consultation. More than 50% of the time in this consultation was spent in counseling and care coordination. ____________________________________________________________________________  Symptom Management/Plan:  COVID-19 Viral Infection: Patient remains asymptomatic, denies pain/discomfort, chills. No cough, no respiratory distress or need for oxygen supplementation. Continue supportive care; continue OT/PT/ST. Repeat COVID-19 test to determine if patient is negative.  Palliative will continue to monitor for symptom management/decline and make recommendations as needed. Return 6 weeks or prn. Encouraged to call provider sooner with any concerns.   PPS: 50%  HOSPICE ELIGIBILITY/DIAGNOSIS: TBD  HISTORY OF PRESENT ILLNESS:  [name] is a 87 y.o. female with multiple medical problems include COVID-19 viral infection. Epic chart review indicates patient tested positive for Covid 19 while in the hospital; she was unvaccinated with No pneumonia or hypoxia. Mildly elevated inflammatory markers which was downtrending.  Patient did not need any specific treatment for COVID-19 other than supportive treatment.  Patient denies cough or respiratory distress.  History of CKD 3 Type 2, COPD, Dementia.  Review and summarization of old Epic records shows history from other than patient. Rest of 10 point system reviewed and negative. Labs:   No results for input(s): WBC, HGB, HCT, PLT, MCV in the last 168 hours. No results for input(s): NA, K, CL, CO2, BUN, CREATININE, GLUCOSE in the last 168 hours. Latest GFR by Cockcroft Gault (not valid in AKI or  ESRD) Estimated Creatinine Clearance: 27 mL/min (  A) (by C-G formula based on SCr of 1.34 mg/dL (H)). No results for input(s): AST, ALT, ALKPHOS, GGT in the last 168 hours.  Invalid input(s): TBILI, CONJBILI, ALB, TOTALPROTEIN No components found for: ALB No results for input(s): APTT, INR in the last 168 hours.  Invalid input(s): PTPATIENT No results for input(s): BNP, PROBNP in the last 168 hours. Review or lab tests,/diagnostics Records reviewed and summarized above.  PAST MEDICAL HISTORY:  Past Medical History:  Diagnosis Date  . Asthma   . CKD (chronic kidney disease) stage 3, GFR 30-59 ml/min ([place]) 05/01/2018  . Colostomy present ([place])    hx/notes 07/17/2010  . Dementia ([place])   . Diabetic neuropathy ([place])   . Hypertension    hx/notes 07/17/2010  . Pneumonia    recent/notes 10/08/2016  . Symptomatic bradycardia 12/15/2017  . Type II diabetes mellitus ([place])    hx/notes 07/17/2010     SOCIAL HX:  Social History   Tobacco Use  . Smoking status: Never Smoker  . Smokeless tobacco: Former Systems developer    Types: Chew  Substance Use Topics  . Alcohol use: No    FAMILY HX:  Family History  Problem Relation Age of Onset  . Kidney disease Mother   . Hypertension Mother   . Other Father        gangrene with amputation  . Diabetes Sister   . Cancer Brother   . Heart disease Brother   . Schizophrenia Daughter   . Diabetes Daughter   . Stroke Daughter   . Diabetes Sister   . Breast cancer Neg Hx     ALLERGIES:  Allergies  Allergen Reactions  . Aspirin Other (See Comments)    Triggers asthma      PERTINENT MEDICATIONS:  Outpatient Encounter Medications as of 04/22/2020  Medication Sig  . acetaminophen (TYLENOL) 500 MG tablet Take 500 mg by mouth daily as needed for fever or headache (pain).  Marland Kitchen albuterol (PROVENTIL HFA;VENTOLIN HFA) 108 (90 Base) MCG/ACT inhaler Inhale 2 puffs into the lungs every 4 (four) hours as needed for wheezing or shortness of breath.  Marland Kitchen alendronate  (FOSAMAX) 70 MG tablet Take 70 mg by mouth every Sunday.  . busPIRone (BUSPAR) 5 MG tablet Take 5 mg by mouth 3 (three) times daily.  . Calcium Carb-Cholecalciferol (CALCIUM 600-D PO) Take 1 tablet by mouth every morning.  . donepezil (ARICEPT) 10 MG tablet Take 10 mg by mouth at bedtime.   Marland Kitchen escitalopram (LEXAPRO) 10 MG tablet Take 10 mg by mouth every evening.  . feeding supplement (ENSURE ENLIVE / ENSURE PLUS) LIQD Take 237 mLs by mouth 2 (two) times daily between meals.  Marland Kitchen FLOVENT HFA 220 MCG/ACT inhaler Inhale 1 puff into the lungs 2 (two) times daily.  . memantine (NAMENDA XR) 28 MG CP24 24 hr capsule Take 28 mg by mouth every morning.  . metFORMIN (GLUCOPHAGE-XR) 500 MG 24 hr tablet Take 500 mg by mouth daily with supper.  . montelukast (SINGULAIR) 10 MG tablet Take 10 mg by mouth at bedtime.   . ondansetron (ZOFRAN) 4 MG tablet Take 4 mg by mouth every 12 (twelve) hours as needed for nausea or vomiting.  . pravastatin (PRAVACHOL) 10 MG tablet Take 10 mg by mouth at bedtime.   No facility-administered encounter medications on file as of 04/22/2020.    ROS  General: NAD Constitution: Denies fever/chills EYES: denies vision changes ENMT: denies Xerostomia, dysphagia Cardiovascular: denies chest pain Pulmonary: denies  cough, denies dyspnea  Abdomen: endorses  fair appetite, denies constipation or diarrhea GU: denies dysuria, urinary incontinence MSK:  endorses ROM limitations, no falls reported; no joint pain Skin: denies rashes/bruising Neurological: endorses weakness, denies pain, denies insomnia Psych: Endorses positive mood Heme/lymph/immuno: denies bruises, no abnormal bleeding  Height: 5 feet 5 inches  Weight:123.4 T 98.3, BP 132/76 P78, 02 98% RA per nursing.   Physical exam deferred due to telehealth visit.  Palliative Care was asked to follow this patient by consultation request of Dr. Newt Lukes to help address advance care planning and complex decision making. Thank  you for the opportunity to participate in the care of [place] Please call our office at [phone] if we can be of additional assistance.  Note: Portions of this note were generated with Lobbyist. Dictation errors may occur despite best attempts at proofreading.  Teodoro Spray, NP

## 2020-04-26 ENCOUNTER — Encounter: Payer: Self-pay | Admitting: Adult Health

## 2020-04-26 ENCOUNTER — Non-Acute Institutional Stay (SKILLED_NURSING_FACILITY): Payer: Medicare Other | Admitting: Adult Health

## 2020-04-26 DIAGNOSIS — J438 Other emphysema: Secondary | ICD-10-CM

## 2020-04-26 DIAGNOSIS — F0150 Vascular dementia without behavioral disturbance: Secondary | ICD-10-CM

## 2020-04-26 DIAGNOSIS — E782 Mixed hyperlipidemia: Secondary | ICD-10-CM

## 2020-04-26 DIAGNOSIS — U071 COVID-19: Secondary | ICD-10-CM | POA: Diagnosis not present

## 2020-04-26 DIAGNOSIS — F329 Major depressive disorder, single episode, unspecified: Secondary | ICD-10-CM

## 2020-04-26 DIAGNOSIS — R531 Weakness: Secondary | ICD-10-CM | POA: Diagnosis not present

## 2020-04-26 DIAGNOSIS — E118 Type 2 diabetes mellitus with unspecified complications: Secondary | ICD-10-CM | POA: Diagnosis not present

## 2020-04-26 DIAGNOSIS — N179 Acute kidney failure, unspecified: Secondary | ICD-10-CM | POA: Diagnosis not present

## 2020-04-26 NOTE — Progress Notes (Signed)
Location:  [facility] Room Number: 227-B Place of Service:  SNF (31) Provider:  Durenda Age, DNP, FNP-BC  Patient Care Team: Leeroy Cha, MD as PCP - General (Internal Medicine) Skeet Latch, MD as PCP - Cardiology (Cardiology) Evans Lance, MD as PCP - Electrophysiology (Cardiology)  Extended Emergency Contact Information Primary Emergency Contact: August Albino of [place] Phone: [phone] Mobile Phone: [phone] Relation: Granddaughter  Code Status:  FULL CODE  Goals of care: Advanced Directive information Advanced Directives 04/08/2020  Does Patient Have a Medical Advance Directive? Yes  Type of Advance Directive [place]  Does patient want to make changes to medical advance directive? No - Patient declined  Copy of [place] in Chart? No - copy requested  Would patient like information on creating a medical advance directive? -  Pre-existing out of facility DNR order (yellow form or pink MOST form) -     Chief Complaint  Patient presents with  . Discharge Note    Patient is seen for discharge from SNF    HPI:  Pt is an 87 y.o. female who is for discharge home on 04/27/20 with Home health PT, OT and Nurse for medication management and colostomy care.  She was admitted to [facility] on 04/11/2020 post hospitalization 04/04/2020 to 04/11/2020 for AKI.  She presented to the hospital with decreased urinary output and mental status changes.  She was noted to be dehydrated with poor oral intake and was incidentally noted to have COVID-19 infection.  She given IV fluids and did not need any specific treatment for COVID-19.  She was asymptomatic and unvaccinated.  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  Patient was admitted to this facility for short-term rehabilitation after the patient's recent hospitalization.  Patient has completed SNF rehabilitation and therapy has cleared the patient for discharge.   Past Medical History:  Diagnosis Date  . Asthma   . CKD (chronic kidney disease) stage 3, GFR 30-59 ml/min ([place]) 05/01/2018  . Colostomy present ([place])    hx/notes 07/17/2010  . Dementia ([place])   . Diabetic neuropathy ([place])   . Hypertension    hx/notes 07/17/2010  . Pneumonia    recent/notes 10/08/2016  . Symptomatic bradycardia 12/15/2017  . Type II diabetes mellitus ([place])    hx/notes 07/17/2010   Past Surgical History:  Procedure Laterality Date  . ABDOMINAL HYSTERECTOMY    . COLOSTOMY     S/P SBO hx/notes 07/17/2010  . LUMBAR FUSION  11/2006   hx/notes 07/17/2010  . NASAL SINUS SURGERY  04/28/2002   Bilateral inferior turbinate reductions, bilateral maxillary antrotomies, bilateral total ethmoidectomies, bilateral frontal recess explorations, bilateral sphenoidotomies, Instatrac guidance./notes 07/28/2010  . PACEMAKER IMPLANT N/A 05/02/2018   Procedure: PACEMAKER IMPLANT;  Surgeon: Evans Lance, MD;  Location: [facility];  Service: Cardiovascular;  Laterality: N/A;    Allergies  Allergen Reactions  . Aspirin Other (See Comments)    Triggers asthma    Outpatient Encounter Medications as of 04/26/2020  Medication Sig  . acetaminophen (TYLENOL) 500 MG tablet Take 500 mg by mouth daily as needed for fever or headache (pain).  Marland Kitchen albuterol (PROVENTIL HFA;VENTOLIN HFA) 108 (90 Base) MCG/ACT inhaler Inhale 2 puffs into the lungs every 4 (four) hours as needed for wheezing or shortness  of breath.  Marland Kitchen alendronate (FOSAMAX) 70 MG tablet Take 70 mg by mouth every Sunday.  . busPIRone (BUSPAR) 5 MG tablet Take 5 mg by mouth 3 (three) times daily.  . Calcium Carb-Cholecalciferol (CALCIUM 600-D PO) Take 1 tablet by mouth every morning.  . donepezil  (ARICEPT) 10 MG tablet Take 10 mg by mouth at bedtime.   Marland Kitchen escitalopram (LEXAPRO) 10 MG tablet Take 10 mg by mouth every evening.  . feeding supplement (ENSURE ENLIVE / ENSURE PLUS) LIQD Take 237 mLs by mouth 2 (two) times daily between meals.  Marland Kitchen FLOVENT HFA 220 MCG/ACT inhaler Inhale 1 puff into the lungs 2 (two) times daily.  . memantine (NAMENDA XR) 28 MG CP24 24 hr capsule Take 28 mg by mouth every morning.  . metFORMIN (GLUCOPHAGE-XR) 500 MG 24 hr tablet Take 500 mg by mouth daily with supper.  . montelukast (SINGULAIR) 10 MG tablet Take 10 mg by mouth at bedtime.   . ondansetron (ZOFRAN) 4 MG tablet Take 4 mg by mouth every 12 (twelve) hours as needed for nausea or vomiting.  . pravastatin (PRAVACHOL) 10 MG tablet Take 10 mg by mouth at bedtime.   No facility-administered encounter medications on file as of 04/26/2020.    Review of Systems  GENERAL: No change in appetite, no fatigue, no weight changes, no fever, chills or weakness MOUTH and THROAT: Denies oral discomfort, gingival pain or bleeding RESPIRATORY: no cough, SOB, DOE, wheezing, hemoptysis CARDIAC: No chest pain, edema or palpitations GI: No abdominal pain nor nausea or vomiting GU: Denies dysuria, frequency, hematuria, incontinence, or discharge NEUROLOGICAL: Denies dizziness, syncope, numbness, or headache PSYCHIATRIC: Denies feelings of depression or anxiety. No report of hallucinations, insomnia, paranoia, or agitation   Pertinent  Health Maintenance Due  Topic Date Due  . FOOT EXAM  Never done  . OPHTHALMOLOGY EXAM  Never done  . URINE MICROALBUMIN  Never done  . PNA vac Low Risk Adult (1 of 2 - PCV13) Never done  . INFLUENZA VACCINE  Never done  . HEMOGLOBIN A1C  10/05/2020  . DEXA SCAN  Completed   Fall Risk  11/10/2018 07/28/2017  Falls in the past year? 1 No  Number falls in past yr: 1 -  Injury with Fall? 1 -  Comment Hit head on dresser when passed out -  Risk for fall due to : History of  fall(s);Impaired balance/gait;Impaired mobility -  Follow up Falls evaluation completed;Education provided;Falls prevention discussed -     Vitals:   04/26/20 1037  BP: 113/61  Pulse: 63  Resp: 20  Temp: (!) 97.3 F (36.3 C)  TempSrc: Oral  Weight: 127 lb 9.6 oz (57.9 kg)  Height: '5\' 4"'$  (1.626 m)   Body mass index is 21.9 kg/m.  Physical Exam  GENERAL APPEARANCE: Well nourished. In no acute distress. Normal body habitus SKIN:  Skin is warm and dry.  MOUTH and THROAT: Lips are without lesions. Oral mucosa is moist and without lesions. Tongue is normal in shape, size, and color and without lesions RESPIRATORY: Breathing is even & unlabored, BS CTAB CARDIAC: RRR, no murmur,no extra heart sounds, no edema GI: +colostomy on LLQ NEUROLOGICAL: There is no tremor. Speech is clear.  PSYCHIATRIC:  Affect and behavior are appropriate  Labs reviewed: Recent Labs    04/09/20 0413 04/10/20 0412 04/11/20 0353  NA 142 142 139  K 4.4 4.0 4.3  CL 113* 112* 109  CO2 19* 23 23  GLUCOSE 128* 112* 116*  BUN 28*  27* 31*  CREATININE 1.39* 1.40* 1.34*  CALCIUM 8.7* 8.7* 8.6*   Recent Labs    04/09/20 0413 04/10/20 0412 04/11/20 0353  AST '20 19 17  '$ ALT '9 9 10  '$ ALKPHOS 49 60 59  BILITOT 0.3 0.4 0.3  PROT 6.1* 6.3* 5.9*  ALBUMIN 3.1* 3.2* 3.1*   Recent Labs    04/09/20 0413 04/10/20 0412 04/11/20 0353  WBC 4.2 5.0 5.5  NEUTROABS 1.9 2.0 2.5  HGB 10.2* 10.2* 9.5*  HCT 32.9* 32.9* 30.3*  MCV 98.2 99.4 99.0  PLT 152 141* 151   Lab Results  Component Value Date   TSH 2.556 04/07/2020   Lab Results  Component Value Date   HGBA1C 5.7 (H) 04/07/2020    Significant Diagnostic Results in last 30 days:  DG Chest Portable 1 View  Result Date: 04/07/2020 CLINICAL DATA:  Cough EXAM: PORTABLE CHEST 1 VIEW COMPARISON:  05/10/2019 FINDINGS: Cardiac pacemaker. Heart size and pulmonary vascularity are normal. Probable linear atelectasis in the lung bases. No airspace disease or  consolidation. No pleural effusions. No pneumothorax. Degenerative changes in the spine and shoulders. IMPRESSION: Probable linear atelectasis in the lung bases. Electronically Signed   By: Lucienne Capers M.D.   On: 04/07/2020 01:23    Assessment/Plan  1. AKI (acute kidney injury) ([place]) -   Was given IV fluids -  GFR improved from 19 (04/06/20) to 26 04/11/20  2. COVID-19 virus infection -   Asymptomatic, did not need any specific treatment  3. Diabetes mellitus with complication [facility]) Lab Results  Component Value Date   HGBA1C 5.7 (H) 04/07/2020   - metFORMIN (GLUCOPHAGE-XR) 500 MG 24 hr tablet; Take 1 tablet (500 mg total) by mouth daily with supper.  Dispense: 30 tablet; Refill: 0  4. Major depression, chronic - busPIRone (BUSPAR) 5 MG tablet; Take 1 tablet (5 mg total) by mouth 3 (three) times daily.  Dispense: 90 tablet; Refill: 0 - escitalopram (LEXAPRO) 10 MG tablet; Take 1 tablet (10 mg total) by mouth every evening.  Dispense: 30 tablet; Refill: 0  5. Other emphysema (HCC) - albuterol (VENTOLIN HFA) 108 (90 Base) MCG/ACT inhaler; Inhale 2 puffs into the lungs every 4 (four) hours as needed for wheezing or shortness of breath.  Dispense: 18 g; Refill: 0 - FLOVENT HFA 220 MCG/ACT inhaler; Inhale 1 puff into the lungs 2 (two) times daily.  Dispense: 1 each; Refill: 0 - montelukast (SINGULAIR) 10 MG tablet; Take 1 tablet (10 mg total) by mouth at bedtime.  Dispense: 30 tablet; Refill: 0  6. Mixed hyperlipidemia - pravastatin (PRAVACHOL) 10 MG tablet; Take 1 tablet (10 mg total) by mouth at bedtime.  Dispense: 30 tablet; Refill: 0  7. Vascular dementia without behavioral disturbance (HCC) - donepezil (ARICEPT) 10 MG tablet; Take 1 tablet (10 mg total) by mouth at bedtime.  Dispense: 30 tablet; Refill: 0 - memantine (NAMENDA XR) 28 MG CP24 24 hr capsule; Take 1 capsule (28 mg total) by mouth every morning.  Dispense: 30 capsule; Refill: 0  8.  Generalized weakness -   S/P  COVID-19 infection -    Will have home health PT and OT for therapeutic strengthening exercises     I have filled out patient's discharge paperwork and written prescriptions.  Patient will receive home health PT, OT and Nurse for medication management and colostomy care.  DME provided: 3-in-1, colostomy supplies and wheelchair  Wheelchair   -   patient suffers from generalized weakness due to Covid 19 infection  which impairs her ability to perform daily activities like toileting, feeding, dressing, grooming and bathing in the home.  A cane or walker will not resolve issue with performing activities of daily living.  A wheelchair will allow patient to safely perform daily activities.  Patient has a caregiver in the home who can provide assistance.    Total discharge time: Greater than 30 minutes Greater than 50% was spent in counseling and coordination of care.   Discharge time involved coordination of the discharge process with social worker, nursing staff and therapy department. Medical justification for home health services/DME verified.    Durenda Age, DNP, MSN, FNP-BC [facility] [phone] (Monday-Friday 8:00 a.m. - 5:00 p.m.) [phone] (after hours)

## 2020-04-29 MED ORDER — FLOVENT HFA 220 MCG/ACT IN AERO: 1.0000 | INHALATION_SPRAY | Freq: Two times a day (BID) | RESPIRATORY_TRACT | 0 refills | Status: DC

## 2020-04-29 MED ORDER — BUSPIRONE HCL 5 MG PO TABS: 5.0000 mg | ORAL_TABLET | Freq: Three times a day (TID) | ORAL | 0 refills | Status: AC

## 2020-04-29 MED ORDER — MONTELUKAST SODIUM 10 MG PO TABS: 10.0000 mg | ORAL_TABLET | Freq: Every day | ORAL | 0 refills | Status: DC

## 2020-04-29 MED ORDER — DONEPEZIL HCL 10 MG PO TABS: 10.0000 mg | ORAL_TABLET | Freq: Every day | ORAL | 0 refills | Status: DC

## 2020-04-29 MED ORDER — ALBUTEROL SULFATE HFA 108 (90 BASE) MCG/ACT IN AERS: 2.0000 | INHALATION_SPRAY | RESPIRATORY_TRACT | 0 refills | Status: DC | PRN

## 2020-04-29 MED ORDER — ONDANSETRON HCL 4 MG PO TABS
4.0000 mg | ORAL_TABLET | Freq: Two times a day (BID) | ORAL | 0 refills | Status: DC | PRN
Start: 2020-04-29 — End: 2020-09-07

## 2020-04-29 MED ORDER — MEMANTINE HCL ER 28 MG PO CP24: 28.0000 mg | ORAL_CAPSULE | Freq: Every morning | ORAL | 0 refills | Status: DC

## 2020-04-29 MED ORDER — PRAVASTATIN SODIUM 10 MG PO TABS: 10.0000 mg | ORAL_TABLET | Freq: Every day | ORAL | 0 refills | Status: DC

## 2020-04-29 MED ORDER — METFORMIN HCL ER 500 MG PO TB24: 500.0000 mg | ORAL_TABLET | Freq: Every day | ORAL | 0 refills | Status: DC

## 2020-04-29 MED ORDER — ESCITALOPRAM OXALATE 10 MG PO TABS: 10.0000 mg | ORAL_TABLET | Freq: Every evening | ORAL | 0 refills | Status: DC

## 2020-04-29 MED ORDER — ALENDRONATE SODIUM 70 MG PO TABS: 70.0000 mg | ORAL_TABLET | ORAL | 0 refills | Status: DC

## 2020-05-03 DIAGNOSIS — M19012 Primary osteoarthritis, left shoulder: Secondary | ICD-10-CM | POA: Diagnosis not present

## 2020-05-03 DIAGNOSIS — E1122 Type 2 diabetes mellitus with diabetic chronic kidney disease: Secondary | ICD-10-CM | POA: Diagnosis not present

## 2020-05-03 DIAGNOSIS — Z7984 Long term (current) use of oral hypoglycemic drugs: Secondary | ICD-10-CM | POA: Diagnosis not present

## 2020-05-03 DIAGNOSIS — Z981 Arthrodesis status: Secondary | ICD-10-CM | POA: Diagnosis not present

## 2020-05-03 DIAGNOSIS — E1140 Type 2 diabetes mellitus with diabetic neuropathy, unspecified: Secondary | ICD-10-CM | POA: Diagnosis not present

## 2020-05-03 DIAGNOSIS — Z950 Presence of cardiac pacemaker: Secondary | ICD-10-CM | POA: Diagnosis not present

## 2020-05-03 DIAGNOSIS — Z8616 Personal history of COVID-19: Secondary | ICD-10-CM | POA: Diagnosis not present

## 2020-05-03 DIAGNOSIS — Z9181 History of falling: Secondary | ICD-10-CM | POA: Diagnosis not present

## 2020-05-03 DIAGNOSIS — M6281 Muscle weakness (generalized): Secondary | ICD-10-CM | POA: Diagnosis not present

## 2020-05-03 DIAGNOSIS — M19011 Primary osteoarthritis, right shoulder: Secondary | ICD-10-CM | POA: Diagnosis not present

## 2020-05-03 DIAGNOSIS — M479 Spondylosis, unspecified: Secondary | ICD-10-CM | POA: Diagnosis not present

## 2020-05-03 DIAGNOSIS — I5032 Chronic diastolic (congestive) heart failure: Secondary | ICD-10-CM | POA: Diagnosis not present

## 2020-05-03 DIAGNOSIS — J449 Chronic obstructive pulmonary disease, unspecified: Secondary | ICD-10-CM | POA: Diagnosis not present

## 2020-05-03 DIAGNOSIS — N183 Chronic kidney disease, stage 3 unspecified: Secondary | ICD-10-CM | POA: Diagnosis not present

## 2020-05-03 DIAGNOSIS — E785 Hyperlipidemia, unspecified: Secondary | ICD-10-CM | POA: Diagnosis not present

## 2020-05-06 DIAGNOSIS — Z981 Arthrodesis status: Secondary | ICD-10-CM | POA: Diagnosis not present

## 2020-05-06 DIAGNOSIS — I5032 Chronic diastolic (congestive) heart failure: Secondary | ICD-10-CM | POA: Diagnosis not present

## 2020-05-06 DIAGNOSIS — E785 Hyperlipidemia, unspecified: Secondary | ICD-10-CM | POA: Diagnosis not present

## 2020-05-06 DIAGNOSIS — E1140 Type 2 diabetes mellitus with diabetic neuropathy, unspecified: Secondary | ICD-10-CM | POA: Diagnosis not present

## 2020-05-06 DIAGNOSIS — M19012 Primary osteoarthritis, left shoulder: Secondary | ICD-10-CM | POA: Diagnosis not present

## 2020-05-06 DIAGNOSIS — J449 Chronic obstructive pulmonary disease, unspecified: Secondary | ICD-10-CM | POA: Diagnosis not present

## 2020-05-06 DIAGNOSIS — N183 Chronic kidney disease, stage 3 unspecified: Secondary | ICD-10-CM | POA: Diagnosis not present

## 2020-05-06 DIAGNOSIS — Z9181 History of falling: Secondary | ICD-10-CM | POA: Diagnosis not present

## 2020-05-06 DIAGNOSIS — Z950 Presence of cardiac pacemaker: Secondary | ICD-10-CM | POA: Diagnosis not present

## 2020-05-06 DIAGNOSIS — M479 Spondylosis, unspecified: Secondary | ICD-10-CM | POA: Diagnosis not present

## 2020-05-06 DIAGNOSIS — E1122 Type 2 diabetes mellitus with diabetic chronic kidney disease: Secondary | ICD-10-CM | POA: Diagnosis not present

## 2020-05-06 DIAGNOSIS — M6281 Muscle weakness (generalized): Secondary | ICD-10-CM | POA: Diagnosis not present

## 2020-05-06 DIAGNOSIS — M19011 Primary osteoarthritis, right shoulder: Secondary | ICD-10-CM | POA: Diagnosis not present

## 2020-05-06 DIAGNOSIS — Z7984 Long term (current) use of oral hypoglycemic drugs: Secondary | ICD-10-CM | POA: Diagnosis not present

## 2020-05-06 DIAGNOSIS — Z8616 Personal history of COVID-19: Secondary | ICD-10-CM | POA: Diagnosis not present

## 2020-05-09 DIAGNOSIS — E1122 Type 2 diabetes mellitus with diabetic chronic kidney disease: Secondary | ICD-10-CM | POA: Diagnosis not present

## 2020-05-09 DIAGNOSIS — E785 Hyperlipidemia, unspecified: Secondary | ICD-10-CM | POA: Diagnosis not present

## 2020-05-09 DIAGNOSIS — N183 Chronic kidney disease, stage 3 unspecified: Secondary | ICD-10-CM | POA: Diagnosis not present

## 2020-05-09 DIAGNOSIS — M6281 Muscle weakness (generalized): Secondary | ICD-10-CM | POA: Diagnosis not present

## 2020-05-09 DIAGNOSIS — Z9181 History of falling: Secondary | ICD-10-CM | POA: Diagnosis not present

## 2020-05-09 DIAGNOSIS — J449 Chronic obstructive pulmonary disease, unspecified: Secondary | ICD-10-CM | POA: Diagnosis not present

## 2020-05-09 DIAGNOSIS — E1140 Type 2 diabetes mellitus with diabetic neuropathy, unspecified: Secondary | ICD-10-CM | POA: Diagnosis not present

## 2020-05-09 DIAGNOSIS — I5032 Chronic diastolic (congestive) heart failure: Secondary | ICD-10-CM | POA: Diagnosis not present

## 2020-05-09 DIAGNOSIS — Z950 Presence of cardiac pacemaker: Secondary | ICD-10-CM | POA: Diagnosis not present

## 2020-05-09 DIAGNOSIS — Z981 Arthrodesis status: Secondary | ICD-10-CM | POA: Diagnosis not present

## 2020-05-09 DIAGNOSIS — M19012 Primary osteoarthritis, left shoulder: Secondary | ICD-10-CM | POA: Diagnosis not present

## 2020-05-09 DIAGNOSIS — Z8616 Personal history of COVID-19: Secondary | ICD-10-CM | POA: Diagnosis not present

## 2020-05-09 DIAGNOSIS — Z7984 Long term (current) use of oral hypoglycemic drugs: Secondary | ICD-10-CM | POA: Diagnosis not present

## 2020-05-09 DIAGNOSIS — M19011 Primary osteoarthritis, right shoulder: Secondary | ICD-10-CM | POA: Diagnosis not present

## 2020-05-09 DIAGNOSIS — M479 Spondylosis, unspecified: Secondary | ICD-10-CM | POA: Diagnosis not present

## 2020-05-13 DIAGNOSIS — Z8616 Personal history of COVID-19: Secondary | ICD-10-CM | POA: Diagnosis not present

## 2020-05-13 DIAGNOSIS — M19011 Primary osteoarthritis, right shoulder: Secondary | ICD-10-CM | POA: Diagnosis not present

## 2020-05-13 DIAGNOSIS — M6281 Muscle weakness (generalized): Secondary | ICD-10-CM | POA: Diagnosis not present

## 2020-05-13 DIAGNOSIS — Z981 Arthrodesis status: Secondary | ICD-10-CM | POA: Diagnosis not present

## 2020-05-13 DIAGNOSIS — E1140 Type 2 diabetes mellitus with diabetic neuropathy, unspecified: Secondary | ICD-10-CM | POA: Diagnosis not present

## 2020-05-13 DIAGNOSIS — Z7984 Long term (current) use of oral hypoglycemic drugs: Secondary | ICD-10-CM | POA: Diagnosis not present

## 2020-05-13 DIAGNOSIS — E785 Hyperlipidemia, unspecified: Secondary | ICD-10-CM | POA: Diagnosis not present

## 2020-05-13 DIAGNOSIS — Z950 Presence of cardiac pacemaker: Secondary | ICD-10-CM | POA: Diagnosis not present

## 2020-05-13 DIAGNOSIS — M19012 Primary osteoarthritis, left shoulder: Secondary | ICD-10-CM | POA: Diagnosis not present

## 2020-05-13 DIAGNOSIS — N183 Chronic kidney disease, stage 3 unspecified: Secondary | ICD-10-CM | POA: Diagnosis not present

## 2020-05-13 DIAGNOSIS — I5032 Chronic diastolic (congestive) heart failure: Secondary | ICD-10-CM | POA: Diagnosis not present

## 2020-05-13 DIAGNOSIS — J449 Chronic obstructive pulmonary disease, unspecified: Secondary | ICD-10-CM | POA: Diagnosis not present

## 2020-05-13 DIAGNOSIS — M479 Spondylosis, unspecified: Secondary | ICD-10-CM | POA: Diagnosis not present

## 2020-05-13 DIAGNOSIS — Z9181 History of falling: Secondary | ICD-10-CM | POA: Diagnosis not present

## 2020-05-13 DIAGNOSIS — E1122 Type 2 diabetes mellitus with diabetic chronic kidney disease: Secondary | ICD-10-CM | POA: Diagnosis not present

## 2020-05-17 DIAGNOSIS — J449 Chronic obstructive pulmonary disease, unspecified: Secondary | ICD-10-CM | POA: Diagnosis not present

## 2020-05-17 DIAGNOSIS — I5032 Chronic diastolic (congestive) heart failure: Secondary | ICD-10-CM | POA: Diagnosis not present

## 2020-05-17 DIAGNOSIS — E785 Hyperlipidemia, unspecified: Secondary | ICD-10-CM | POA: Diagnosis not present

## 2020-05-17 DIAGNOSIS — M19012 Primary osteoarthritis, left shoulder: Secondary | ICD-10-CM | POA: Diagnosis not present

## 2020-05-17 DIAGNOSIS — E1122 Type 2 diabetes mellitus with diabetic chronic kidney disease: Secondary | ICD-10-CM | POA: Diagnosis not present

## 2020-05-17 DIAGNOSIS — Z9181 History of falling: Secondary | ICD-10-CM | POA: Diagnosis not present

## 2020-05-17 DIAGNOSIS — Z981 Arthrodesis status: Secondary | ICD-10-CM | POA: Diagnosis not present

## 2020-05-17 DIAGNOSIS — Z8616 Personal history of COVID-19: Secondary | ICD-10-CM | POA: Diagnosis not present

## 2020-05-17 DIAGNOSIS — M6281 Muscle weakness (generalized): Secondary | ICD-10-CM | POA: Diagnosis not present

## 2020-05-17 DIAGNOSIS — N183 Chronic kidney disease, stage 3 unspecified: Secondary | ICD-10-CM | POA: Diagnosis not present

## 2020-05-17 DIAGNOSIS — Z950 Presence of cardiac pacemaker: Secondary | ICD-10-CM | POA: Diagnosis not present

## 2020-05-17 DIAGNOSIS — M19011 Primary osteoarthritis, right shoulder: Secondary | ICD-10-CM | POA: Diagnosis not present

## 2020-05-17 DIAGNOSIS — E1140 Type 2 diabetes mellitus with diabetic neuropathy, unspecified: Secondary | ICD-10-CM | POA: Diagnosis not present

## 2020-05-17 DIAGNOSIS — M479 Spondylosis, unspecified: Secondary | ICD-10-CM | POA: Diagnosis not present

## 2020-05-17 DIAGNOSIS — Z7984 Long term (current) use of oral hypoglycemic drugs: Secondary | ICD-10-CM | POA: Diagnosis not present

## 2020-05-21 DIAGNOSIS — E1122 Type 2 diabetes mellitus with diabetic chronic kidney disease: Secondary | ICD-10-CM | POA: Diagnosis not present

## 2020-05-21 DIAGNOSIS — M6281 Muscle weakness (generalized): Secondary | ICD-10-CM | POA: Diagnosis not present

## 2020-05-21 DIAGNOSIS — E1140 Type 2 diabetes mellitus with diabetic neuropathy, unspecified: Secondary | ICD-10-CM | POA: Diagnosis not present

## 2020-05-21 DIAGNOSIS — Z981 Arthrodesis status: Secondary | ICD-10-CM | POA: Diagnosis not present

## 2020-05-21 DIAGNOSIS — Z7984 Long term (current) use of oral hypoglycemic drugs: Secondary | ICD-10-CM | POA: Diagnosis not present

## 2020-05-21 DIAGNOSIS — Z950 Presence of cardiac pacemaker: Secondary | ICD-10-CM | POA: Diagnosis not present

## 2020-05-21 DIAGNOSIS — I5032 Chronic diastolic (congestive) heart failure: Secondary | ICD-10-CM | POA: Diagnosis not present

## 2020-05-21 DIAGNOSIS — J449 Chronic obstructive pulmonary disease, unspecified: Secondary | ICD-10-CM | POA: Diagnosis not present

## 2020-05-21 DIAGNOSIS — M479 Spondylosis, unspecified: Secondary | ICD-10-CM | POA: Diagnosis not present

## 2020-05-21 DIAGNOSIS — E785 Hyperlipidemia, unspecified: Secondary | ICD-10-CM | POA: Diagnosis not present

## 2020-05-21 DIAGNOSIS — Z8616 Personal history of COVID-19: Secondary | ICD-10-CM | POA: Diagnosis not present

## 2020-05-21 DIAGNOSIS — N183 Chronic kidney disease, stage 3 unspecified: Secondary | ICD-10-CM | POA: Diagnosis not present

## 2020-05-21 DIAGNOSIS — M19012 Primary osteoarthritis, left shoulder: Secondary | ICD-10-CM | POA: Diagnosis not present

## 2020-05-21 DIAGNOSIS — M19011 Primary osteoarthritis, right shoulder: Secondary | ICD-10-CM | POA: Diagnosis not present

## 2020-05-21 DIAGNOSIS — Z9181 History of falling: Secondary | ICD-10-CM | POA: Diagnosis not present

## 2020-05-22 DIAGNOSIS — I5032 Chronic diastolic (congestive) heart failure: Secondary | ICD-10-CM | POA: Diagnosis not present

## 2020-05-22 DIAGNOSIS — E1140 Type 2 diabetes mellitus with diabetic neuropathy, unspecified: Secondary | ICD-10-CM | POA: Diagnosis not present

## 2020-05-22 DIAGNOSIS — Z9181 History of falling: Secondary | ICD-10-CM | POA: Diagnosis not present

## 2020-05-22 DIAGNOSIS — M19011 Primary osteoarthritis, right shoulder: Secondary | ICD-10-CM | POA: Diagnosis not present

## 2020-05-22 DIAGNOSIS — Z950 Presence of cardiac pacemaker: Secondary | ICD-10-CM | POA: Diagnosis not present

## 2020-05-22 DIAGNOSIS — J449 Chronic obstructive pulmonary disease, unspecified: Secondary | ICD-10-CM | POA: Diagnosis not present

## 2020-05-22 DIAGNOSIS — M479 Spondylosis, unspecified: Secondary | ICD-10-CM | POA: Diagnosis not present

## 2020-05-22 DIAGNOSIS — E1122 Type 2 diabetes mellitus with diabetic chronic kidney disease: Secondary | ICD-10-CM | POA: Diagnosis not present

## 2020-05-22 DIAGNOSIS — N183 Chronic kidney disease, stage 3 unspecified: Secondary | ICD-10-CM | POA: Diagnosis not present

## 2020-05-22 DIAGNOSIS — Z981 Arthrodesis status: Secondary | ICD-10-CM | POA: Diagnosis not present

## 2020-05-22 DIAGNOSIS — M19012 Primary osteoarthritis, left shoulder: Secondary | ICD-10-CM | POA: Diagnosis not present

## 2020-05-22 DIAGNOSIS — M6281 Muscle weakness (generalized): Secondary | ICD-10-CM | POA: Diagnosis not present

## 2020-05-22 DIAGNOSIS — Z8616 Personal history of COVID-19: Secondary | ICD-10-CM | POA: Diagnosis not present

## 2020-05-22 DIAGNOSIS — Z7984 Long term (current) use of oral hypoglycemic drugs: Secondary | ICD-10-CM | POA: Diagnosis not present

## 2020-05-22 DIAGNOSIS — E785 Hyperlipidemia, unspecified: Secondary | ICD-10-CM | POA: Diagnosis not present

## 2020-05-28 DIAGNOSIS — M6281 Muscle weakness (generalized): Secondary | ICD-10-CM | POA: Diagnosis not present

## 2020-05-28 DIAGNOSIS — M19012 Primary osteoarthritis, left shoulder: Secondary | ICD-10-CM | POA: Diagnosis not present

## 2020-05-28 DIAGNOSIS — M19011 Primary osteoarthritis, right shoulder: Secondary | ICD-10-CM | POA: Diagnosis not present

## 2020-05-28 DIAGNOSIS — Z8616 Personal history of COVID-19: Secondary | ICD-10-CM | POA: Diagnosis not present

## 2020-05-28 DIAGNOSIS — Z7984 Long term (current) use of oral hypoglycemic drugs: Secondary | ICD-10-CM | POA: Diagnosis not present

## 2020-05-28 DIAGNOSIS — J449 Chronic obstructive pulmonary disease, unspecified: Secondary | ICD-10-CM | POA: Diagnosis not present

## 2020-05-28 DIAGNOSIS — Z981 Arthrodesis status: Secondary | ICD-10-CM | POA: Diagnosis not present

## 2020-05-28 DIAGNOSIS — E785 Hyperlipidemia, unspecified: Secondary | ICD-10-CM | POA: Diagnosis not present

## 2020-05-28 DIAGNOSIS — Z9181 History of falling: Secondary | ICD-10-CM | POA: Diagnosis not present

## 2020-05-28 DIAGNOSIS — M479 Spondylosis, unspecified: Secondary | ICD-10-CM | POA: Diagnosis not present

## 2020-05-28 DIAGNOSIS — E1122 Type 2 diabetes mellitus with diabetic chronic kidney disease: Secondary | ICD-10-CM | POA: Diagnosis not present

## 2020-05-28 DIAGNOSIS — N183 Chronic kidney disease, stage 3 unspecified: Secondary | ICD-10-CM | POA: Diagnosis not present

## 2020-05-28 DIAGNOSIS — I5032 Chronic diastolic (congestive) heart failure: Secondary | ICD-10-CM | POA: Diagnosis not present

## 2020-05-28 DIAGNOSIS — Z950 Presence of cardiac pacemaker: Secondary | ICD-10-CM | POA: Diagnosis not present

## 2020-05-28 DIAGNOSIS — E1140 Type 2 diabetes mellitus with diabetic neuropathy, unspecified: Secondary | ICD-10-CM | POA: Diagnosis not present

## 2020-05-31 DIAGNOSIS — E1122 Type 2 diabetes mellitus with diabetic chronic kidney disease: Secondary | ICD-10-CM | POA: Diagnosis not present

## 2020-05-31 DIAGNOSIS — M19012 Primary osteoarthritis, left shoulder: Secondary | ICD-10-CM | POA: Diagnosis not present

## 2020-05-31 DIAGNOSIS — Z8616 Personal history of COVID-19: Secondary | ICD-10-CM | POA: Diagnosis not present

## 2020-05-31 DIAGNOSIS — I5032 Chronic diastolic (congestive) heart failure: Secondary | ICD-10-CM | POA: Diagnosis not present

## 2020-05-31 DIAGNOSIS — E785 Hyperlipidemia, unspecified: Secondary | ICD-10-CM | POA: Diagnosis not present

## 2020-05-31 DIAGNOSIS — Z7984 Long term (current) use of oral hypoglycemic drugs: Secondary | ICD-10-CM | POA: Diagnosis not present

## 2020-05-31 DIAGNOSIS — Z950 Presence of cardiac pacemaker: Secondary | ICD-10-CM | POA: Diagnosis not present

## 2020-05-31 DIAGNOSIS — Z981 Arthrodesis status: Secondary | ICD-10-CM | POA: Diagnosis not present

## 2020-05-31 DIAGNOSIS — M6281 Muscle weakness (generalized): Secondary | ICD-10-CM | POA: Diagnosis not present

## 2020-05-31 DIAGNOSIS — E1140 Type 2 diabetes mellitus with diabetic neuropathy, unspecified: Secondary | ICD-10-CM | POA: Diagnosis not present

## 2020-05-31 DIAGNOSIS — J449 Chronic obstructive pulmonary disease, unspecified: Secondary | ICD-10-CM | POA: Diagnosis not present

## 2020-05-31 DIAGNOSIS — M479 Spondylosis, unspecified: Secondary | ICD-10-CM | POA: Diagnosis not present

## 2020-05-31 DIAGNOSIS — Z9181 History of falling: Secondary | ICD-10-CM | POA: Diagnosis not present

## 2020-05-31 DIAGNOSIS — M19011 Primary osteoarthritis, right shoulder: Secondary | ICD-10-CM | POA: Diagnosis not present

## 2020-05-31 DIAGNOSIS — N183 Chronic kidney disease, stage 3 unspecified: Secondary | ICD-10-CM | POA: Diagnosis not present

## 2020-06-03 DIAGNOSIS — Z950 Presence of cardiac pacemaker: Secondary | ICD-10-CM | POA: Diagnosis not present

## 2020-06-03 DIAGNOSIS — J449 Chronic obstructive pulmonary disease, unspecified: Secondary | ICD-10-CM | POA: Diagnosis not present

## 2020-06-03 DIAGNOSIS — Z981 Arthrodesis status: Secondary | ICD-10-CM | POA: Diagnosis not present

## 2020-06-03 DIAGNOSIS — I5032 Chronic diastolic (congestive) heart failure: Secondary | ICD-10-CM | POA: Diagnosis not present

## 2020-06-03 DIAGNOSIS — M479 Spondylosis, unspecified: Secondary | ICD-10-CM | POA: Diagnosis not present

## 2020-06-03 DIAGNOSIS — N183 Chronic kidney disease, stage 3 unspecified: Secondary | ICD-10-CM | POA: Diagnosis not present

## 2020-06-03 DIAGNOSIS — E1140 Type 2 diabetes mellitus with diabetic neuropathy, unspecified: Secondary | ICD-10-CM | POA: Diagnosis not present

## 2020-06-03 DIAGNOSIS — M6281 Muscle weakness (generalized): Secondary | ICD-10-CM | POA: Diagnosis not present

## 2020-06-03 DIAGNOSIS — E1122 Type 2 diabetes mellitus with diabetic chronic kidney disease: Secondary | ICD-10-CM | POA: Diagnosis not present

## 2020-06-03 DIAGNOSIS — M19011 Primary osteoarthritis, right shoulder: Secondary | ICD-10-CM | POA: Diagnosis not present

## 2020-06-03 DIAGNOSIS — Z7984 Long term (current) use of oral hypoglycemic drugs: Secondary | ICD-10-CM | POA: Diagnosis not present

## 2020-06-03 DIAGNOSIS — Z8616 Personal history of COVID-19: Secondary | ICD-10-CM | POA: Diagnosis not present

## 2020-06-03 DIAGNOSIS — E785 Hyperlipidemia, unspecified: Secondary | ICD-10-CM | POA: Diagnosis not present

## 2020-06-03 DIAGNOSIS — M19012 Primary osteoarthritis, left shoulder: Secondary | ICD-10-CM | POA: Diagnosis not present

## 2020-06-03 DIAGNOSIS — Z9181 History of falling: Secondary | ICD-10-CM | POA: Diagnosis not present

## 2020-06-04 DIAGNOSIS — M479 Spondylosis, unspecified: Secondary | ICD-10-CM | POA: Diagnosis not present

## 2020-06-04 DIAGNOSIS — M19012 Primary osteoarthritis, left shoulder: Secondary | ICD-10-CM | POA: Diagnosis not present

## 2020-06-04 DIAGNOSIS — Z9181 History of falling: Secondary | ICD-10-CM | POA: Diagnosis not present

## 2020-06-04 DIAGNOSIS — Z8616 Personal history of COVID-19: Secondary | ICD-10-CM | POA: Diagnosis not present

## 2020-06-04 DIAGNOSIS — Z981 Arthrodesis status: Secondary | ICD-10-CM | POA: Diagnosis not present

## 2020-06-04 DIAGNOSIS — M6281 Muscle weakness (generalized): Secondary | ICD-10-CM | POA: Diagnosis not present

## 2020-06-04 DIAGNOSIS — M19011 Primary osteoarthritis, right shoulder: Secondary | ICD-10-CM | POA: Diagnosis not present

## 2020-06-04 DIAGNOSIS — E1122 Type 2 diabetes mellitus with diabetic chronic kidney disease: Secondary | ICD-10-CM | POA: Diagnosis not present

## 2020-06-04 DIAGNOSIS — Z950 Presence of cardiac pacemaker: Secondary | ICD-10-CM | POA: Diagnosis not present

## 2020-06-04 DIAGNOSIS — E785 Hyperlipidemia, unspecified: Secondary | ICD-10-CM | POA: Diagnosis not present

## 2020-06-04 DIAGNOSIS — N183 Chronic kidney disease, stage 3 unspecified: Secondary | ICD-10-CM | POA: Diagnosis not present

## 2020-06-04 DIAGNOSIS — E1140 Type 2 diabetes mellitus with diabetic neuropathy, unspecified: Secondary | ICD-10-CM | POA: Diagnosis not present

## 2020-06-04 DIAGNOSIS — J449 Chronic obstructive pulmonary disease, unspecified: Secondary | ICD-10-CM | POA: Diagnosis not present

## 2020-06-04 DIAGNOSIS — I5032 Chronic diastolic (congestive) heart failure: Secondary | ICD-10-CM | POA: Diagnosis not present

## 2020-06-04 DIAGNOSIS — Z7984 Long term (current) use of oral hypoglycemic drugs: Secondary | ICD-10-CM | POA: Diagnosis not present

## 2020-06-10 DIAGNOSIS — I5032 Chronic diastolic (congestive) heart failure: Secondary | ICD-10-CM | POA: Diagnosis not present

## 2020-06-10 DIAGNOSIS — Z9181 History of falling: Secondary | ICD-10-CM | POA: Diagnosis not present

## 2020-06-10 DIAGNOSIS — M479 Spondylosis, unspecified: Secondary | ICD-10-CM | POA: Diagnosis not present

## 2020-06-10 DIAGNOSIS — J449 Chronic obstructive pulmonary disease, unspecified: Secondary | ICD-10-CM | POA: Diagnosis not present

## 2020-06-10 DIAGNOSIS — E1140 Type 2 diabetes mellitus with diabetic neuropathy, unspecified: Secondary | ICD-10-CM | POA: Diagnosis not present

## 2020-06-10 DIAGNOSIS — Z950 Presence of cardiac pacemaker: Secondary | ICD-10-CM | POA: Diagnosis not present

## 2020-06-10 DIAGNOSIS — E785 Hyperlipidemia, unspecified: Secondary | ICD-10-CM | POA: Diagnosis not present

## 2020-06-10 DIAGNOSIS — E1122 Type 2 diabetes mellitus with diabetic chronic kidney disease: Secondary | ICD-10-CM | POA: Diagnosis not present

## 2020-06-10 DIAGNOSIS — Z981 Arthrodesis status: Secondary | ICD-10-CM | POA: Diagnosis not present

## 2020-06-10 DIAGNOSIS — M19011 Primary osteoarthritis, right shoulder: Secondary | ICD-10-CM | POA: Diagnosis not present

## 2020-06-10 DIAGNOSIS — N183 Chronic kidney disease, stage 3 unspecified: Secondary | ICD-10-CM | POA: Diagnosis not present

## 2020-06-10 DIAGNOSIS — Z8616 Personal history of COVID-19: Secondary | ICD-10-CM | POA: Diagnosis not present

## 2020-06-10 DIAGNOSIS — Z7984 Long term (current) use of oral hypoglycemic drugs: Secondary | ICD-10-CM | POA: Diagnosis not present

## 2020-06-10 DIAGNOSIS — M19012 Primary osteoarthritis, left shoulder: Secondary | ICD-10-CM | POA: Diagnosis not present

## 2020-06-10 DIAGNOSIS — M6281 Muscle weakness (generalized): Secondary | ICD-10-CM | POA: Diagnosis not present

## 2020-06-13 DIAGNOSIS — J449 Chronic obstructive pulmonary disease, unspecified: Secondary | ICD-10-CM | POA: Diagnosis not present

## 2020-06-13 DIAGNOSIS — M6281 Muscle weakness (generalized): Secondary | ICD-10-CM | POA: Diagnosis not present

## 2020-06-13 DIAGNOSIS — M479 Spondylosis, unspecified: Secondary | ICD-10-CM | POA: Diagnosis not present

## 2020-06-13 DIAGNOSIS — Z981 Arthrodesis status: Secondary | ICD-10-CM | POA: Diagnosis not present

## 2020-06-13 DIAGNOSIS — E1122 Type 2 diabetes mellitus with diabetic chronic kidney disease: Secondary | ICD-10-CM | POA: Diagnosis not present

## 2020-06-13 DIAGNOSIS — I5032 Chronic diastolic (congestive) heart failure: Secondary | ICD-10-CM | POA: Diagnosis not present

## 2020-06-13 DIAGNOSIS — Z8616 Personal history of COVID-19: Secondary | ICD-10-CM | POA: Diagnosis not present

## 2020-06-13 DIAGNOSIS — E1140 Type 2 diabetes mellitus with diabetic neuropathy, unspecified: Secondary | ICD-10-CM | POA: Diagnosis not present

## 2020-06-13 DIAGNOSIS — M19011 Primary osteoarthritis, right shoulder: Secondary | ICD-10-CM | POA: Diagnosis not present

## 2020-06-13 DIAGNOSIS — M19012 Primary osteoarthritis, left shoulder: Secondary | ICD-10-CM | POA: Diagnosis not present

## 2020-06-13 DIAGNOSIS — N183 Chronic kidney disease, stage 3 unspecified: Secondary | ICD-10-CM | POA: Diagnosis not present

## 2020-06-13 DIAGNOSIS — Z950 Presence of cardiac pacemaker: Secondary | ICD-10-CM | POA: Diagnosis not present

## 2020-06-13 DIAGNOSIS — Z9181 History of falling: Secondary | ICD-10-CM | POA: Diagnosis not present

## 2020-06-13 DIAGNOSIS — E785 Hyperlipidemia, unspecified: Secondary | ICD-10-CM | POA: Diagnosis not present

## 2020-06-13 DIAGNOSIS — Z7984 Long term (current) use of oral hypoglycemic drugs: Secondary | ICD-10-CM | POA: Diagnosis not present

## 2020-06-18 DIAGNOSIS — E1140 Type 2 diabetes mellitus with diabetic neuropathy, unspecified: Secondary | ICD-10-CM | POA: Diagnosis not present

## 2020-06-18 DIAGNOSIS — M479 Spondylosis, unspecified: Secondary | ICD-10-CM | POA: Diagnosis not present

## 2020-06-18 DIAGNOSIS — N183 Chronic kidney disease, stage 3 unspecified: Secondary | ICD-10-CM | POA: Diagnosis not present

## 2020-06-18 DIAGNOSIS — M19012 Primary osteoarthritis, left shoulder: Secondary | ICD-10-CM | POA: Diagnosis not present

## 2020-06-18 DIAGNOSIS — Z950 Presence of cardiac pacemaker: Secondary | ICD-10-CM | POA: Diagnosis not present

## 2020-06-18 DIAGNOSIS — M6281 Muscle weakness (generalized): Secondary | ICD-10-CM | POA: Diagnosis not present

## 2020-06-18 DIAGNOSIS — Z981 Arthrodesis status: Secondary | ICD-10-CM | POA: Diagnosis not present

## 2020-06-18 DIAGNOSIS — E785 Hyperlipidemia, unspecified: Secondary | ICD-10-CM | POA: Diagnosis not present

## 2020-06-18 DIAGNOSIS — Z9181 History of falling: Secondary | ICD-10-CM | POA: Diagnosis not present

## 2020-06-18 DIAGNOSIS — I5032 Chronic diastolic (congestive) heart failure: Secondary | ICD-10-CM | POA: Diagnosis not present

## 2020-06-18 DIAGNOSIS — Z8616 Personal history of COVID-19: Secondary | ICD-10-CM | POA: Diagnosis not present

## 2020-06-18 DIAGNOSIS — M19011 Primary osteoarthritis, right shoulder: Secondary | ICD-10-CM | POA: Diagnosis not present

## 2020-06-18 DIAGNOSIS — Z7984 Long term (current) use of oral hypoglycemic drugs: Secondary | ICD-10-CM | POA: Diagnosis not present

## 2020-06-18 DIAGNOSIS — E1122 Type 2 diabetes mellitus with diabetic chronic kidney disease: Secondary | ICD-10-CM | POA: Diagnosis not present

## 2020-06-18 DIAGNOSIS — J449 Chronic obstructive pulmonary disease, unspecified: Secondary | ICD-10-CM | POA: Diagnosis not present

## 2020-06-25 DIAGNOSIS — I5032 Chronic diastolic (congestive) heart failure: Secondary | ICD-10-CM | POA: Diagnosis not present

## 2020-06-25 DIAGNOSIS — M19011 Primary osteoarthritis, right shoulder: Secondary | ICD-10-CM | POA: Diagnosis not present

## 2020-06-25 DIAGNOSIS — N183 Chronic kidney disease, stage 3 unspecified: Secondary | ICD-10-CM | POA: Diagnosis not present

## 2020-06-25 DIAGNOSIS — Z7984 Long term (current) use of oral hypoglycemic drugs: Secondary | ICD-10-CM | POA: Diagnosis not present

## 2020-06-25 DIAGNOSIS — Z950 Presence of cardiac pacemaker: Secondary | ICD-10-CM | POA: Diagnosis not present

## 2020-06-25 DIAGNOSIS — E1140 Type 2 diabetes mellitus with diabetic neuropathy, unspecified: Secondary | ICD-10-CM | POA: Diagnosis not present

## 2020-06-25 DIAGNOSIS — M6281 Muscle weakness (generalized): Secondary | ICD-10-CM | POA: Diagnosis not present

## 2020-06-25 DIAGNOSIS — E1122 Type 2 diabetes mellitus with diabetic chronic kidney disease: Secondary | ICD-10-CM | POA: Diagnosis not present

## 2020-06-25 DIAGNOSIS — E785 Hyperlipidemia, unspecified: Secondary | ICD-10-CM | POA: Diagnosis not present

## 2020-06-25 DIAGNOSIS — M19012 Primary osteoarthritis, left shoulder: Secondary | ICD-10-CM | POA: Diagnosis not present

## 2020-06-25 DIAGNOSIS — J449 Chronic obstructive pulmonary disease, unspecified: Secondary | ICD-10-CM | POA: Diagnosis not present

## 2020-06-25 DIAGNOSIS — Z981 Arthrodesis status: Secondary | ICD-10-CM | POA: Diagnosis not present

## 2020-06-25 DIAGNOSIS — Z8616 Personal history of COVID-19: Secondary | ICD-10-CM | POA: Diagnosis not present

## 2020-06-25 DIAGNOSIS — M479 Spondylosis, unspecified: Secondary | ICD-10-CM | POA: Diagnosis not present

## 2020-06-25 DIAGNOSIS — Z9181 History of falling: Secondary | ICD-10-CM | POA: Diagnosis not present

## 2020-06-27 DIAGNOSIS — K631 Perforation of intestine (nontraumatic): Secondary | ICD-10-CM | POA: Diagnosis not present

## 2020-06-27 DIAGNOSIS — Z933 Colostomy status: Secondary | ICD-10-CM | POA: Diagnosis not present

## 2020-07-01 DIAGNOSIS — K631 Perforation of intestine (nontraumatic): Secondary | ICD-10-CM | POA: Diagnosis not present

## 2020-07-01 DIAGNOSIS — Z933 Colostomy status: Secondary | ICD-10-CM | POA: Diagnosis not present

## 2020-07-02 ENCOUNTER — Telehealth: Payer: Self-pay | Admitting: Cardiovascular Disease

## 2020-07-02 NOTE — Telephone Encounter (Signed)
[name] states the patient would like to switch from Dr. Oval Linsey to Dr. Margaretann Loveless, because she does not want to go to [facility].

## 2020-07-09 NOTE — Telephone Encounter (Signed)
Left message with patient's granddaughter that the provider switch request has been approved.

## 2020-08-01 DIAGNOSIS — K631 Perforation of intestine (nontraumatic): Secondary | ICD-10-CM | POA: Diagnosis not present

## 2020-08-01 DIAGNOSIS — Z933 Colostomy status: Secondary | ICD-10-CM | POA: Diagnosis not present

## 2020-08-29 ENCOUNTER — Inpatient Hospital Stay (HOSPITAL_COMMUNITY)
Admission: EM | Admit: 2020-08-29 | Discharge: 2020-09-07 | DRG: 682 | Disposition: A | Discharge status: Home Health | Payer: Medicare Other | Attending: Family Medicine | Admitting: Family Medicine

## 2020-08-29 ENCOUNTER — Emergency Department (HOSPITAL_COMMUNITY): Payer: Medicare Other

## 2020-08-29 ENCOUNTER — Inpatient Hospital Stay (HOSPITAL_COMMUNITY): Payer: Medicare Other

## 2020-08-29 ENCOUNTER — Encounter (HOSPITAL_COMMUNITY): Payer: Self-pay

## 2020-08-29 DIAGNOSIS — E440 Moderate protein-calorie malnutrition: Secondary | ICD-10-CM | POA: Insufficient documentation

## 2020-08-29 DIAGNOSIS — Z7984 Long term (current) use of oral hypoglycemic drugs: Secondary | ICD-10-CM

## 2020-08-29 DIAGNOSIS — J449 Chronic obstructive pulmonary disease, unspecified: Secondary | ICD-10-CM | POA: Diagnosis present

## 2020-08-29 DIAGNOSIS — Z20822 Contact with and (suspected) exposure to covid-19: Secondary | ICD-10-CM | POA: Diagnosis present

## 2020-08-29 DIAGNOSIS — Z66 Do not resuscitate: Secondary | ICD-10-CM | POA: Diagnosis not present

## 2020-08-29 DIAGNOSIS — F0150 Vascular dementia without behavioral disturbance: Secondary | ICD-10-CM | POA: Diagnosis not present

## 2020-08-29 DIAGNOSIS — E1122 Type 2 diabetes mellitus with diabetic chronic kidney disease: Secondary | ICD-10-CM | POA: Diagnosis present

## 2020-08-29 DIAGNOSIS — Z833 Family history of diabetes mellitus: Secondary | ICD-10-CM

## 2020-08-29 DIAGNOSIS — Z841 Family history of disorders of kidney and ureter: Secondary | ICD-10-CM | POA: Diagnosis not present

## 2020-08-29 DIAGNOSIS — E875 Hyperkalemia: Secondary | ICD-10-CM | POA: Diagnosis present

## 2020-08-29 DIAGNOSIS — Z933 Colostomy status: Secondary | ICD-10-CM

## 2020-08-29 DIAGNOSIS — I130 Hypertensive heart and chronic kidney disease with heart failure and stage 1 through stage 4 chronic kidney disease, or unspecified chronic kidney disease: Secondary | ICD-10-CM | POA: Diagnosis not present

## 2020-08-29 DIAGNOSIS — Z809 Family history of malignant neoplasm, unspecified: Secondary | ICD-10-CM

## 2020-08-29 DIAGNOSIS — F0391 Unspecified dementia with behavioral disturbance: Secondary | ICD-10-CM | POA: Diagnosis present

## 2020-08-29 DIAGNOSIS — Z8616 Personal history of COVID-19: Secondary | ICD-10-CM

## 2020-08-29 DIAGNOSIS — Z515 Encounter for palliative care: Secondary | ICD-10-CM | POA: Diagnosis not present

## 2020-08-29 DIAGNOSIS — R001 Bradycardia, unspecified: Secondary | ICD-10-CM | POA: Diagnosis present

## 2020-08-29 DIAGNOSIS — I5032 Chronic diastolic (congestive) heart failure: Secondary | ICD-10-CM | POA: Diagnosis present

## 2020-08-29 DIAGNOSIS — N1832 Chronic kidney disease, stage 3b: Secondary | ICD-10-CM | POA: Diagnosis not present

## 2020-08-29 DIAGNOSIS — Z950 Presence of cardiac pacemaker: Secondary | ICD-10-CM

## 2020-08-29 DIAGNOSIS — Z886 Allergy status to analgesic agent status: Secondary | ICD-10-CM

## 2020-08-29 DIAGNOSIS — N179 Acute kidney failure, unspecified: Secondary | ICD-10-CM | POA: Diagnosis not present

## 2020-08-29 DIAGNOSIS — Z818 Family history of other mental and behavioral disorders: Secondary | ICD-10-CM

## 2020-08-29 DIAGNOSIS — Z823 Family history of stroke: Secondary | ICD-10-CM

## 2020-08-29 DIAGNOSIS — N170 Acute kidney failure with tubular necrosis: Principal | ICD-10-CM | POA: Diagnosis present

## 2020-08-29 DIAGNOSIS — E43 Unspecified severe protein-calorie malnutrition: Secondary | ICD-10-CM | POA: Diagnosis not present

## 2020-08-29 DIAGNOSIS — I10 Essential (primary) hypertension: Secondary | ICD-10-CM | POA: Diagnosis not present

## 2020-08-29 DIAGNOSIS — R531 Weakness: Secondary | ICD-10-CM | POA: Diagnosis not present

## 2020-08-29 DIAGNOSIS — E860 Dehydration: Secondary | ICD-10-CM | POA: Diagnosis present

## 2020-08-29 DIAGNOSIS — R627 Adult failure to thrive: Secondary | ICD-10-CM | POA: Diagnosis present

## 2020-08-29 DIAGNOSIS — D539 Nutritional anemia, unspecified: Secondary | ICD-10-CM | POA: Diagnosis present

## 2020-08-29 DIAGNOSIS — Z8249 Family history of ischemic heart disease and other diseases of the circulatory system: Secondary | ICD-10-CM

## 2020-08-29 DIAGNOSIS — N189 Chronic kidney disease, unspecified: Secondary | ICD-10-CM | POA: Diagnosis not present

## 2020-08-29 DIAGNOSIS — Z6820 Body mass index (BMI) 20.0-20.9, adult: Secondary | ICD-10-CM

## 2020-08-29 DIAGNOSIS — Z7983 Long term (current) use of bisphosphonates: Secondary | ICD-10-CM

## 2020-08-29 DIAGNOSIS — F329 Major depressive disorder, single episode, unspecified: Secondary | ICD-10-CM

## 2020-08-29 DIAGNOSIS — K8020 Calculus of gallbladder without cholecystitis without obstruction: Secondary | ICD-10-CM | POA: Diagnosis not present

## 2020-08-29 DIAGNOSIS — E119 Type 2 diabetes mellitus without complications: Secondary | ICD-10-CM | POA: Diagnosis present

## 2020-08-29 DIAGNOSIS — Z7189 Other specified counseling: Secondary | ICD-10-CM | POA: Diagnosis not present

## 2020-08-29 DIAGNOSIS — F0390 Unspecified dementia without behavioral disturbance: Secondary | ICD-10-CM | POA: Diagnosis present

## 2020-08-29 DIAGNOSIS — E118 Type 2 diabetes mellitus with unspecified complications: Secondary | ICD-10-CM | POA: Diagnosis not present

## 2020-08-29 DIAGNOSIS — Z743 Need for continuous supervision: Secondary | ICD-10-CM | POA: Diagnosis not present

## 2020-08-29 DIAGNOSIS — M2550 Pain in unspecified joint: Secondary | ICD-10-CM | POA: Diagnosis not present

## 2020-08-29 DIAGNOSIS — Z79899 Other long term (current) drug therapy: Secondary | ICD-10-CM

## 2020-08-29 DIAGNOSIS — E1140 Type 2 diabetes mellitus with diabetic neuropathy, unspecified: Secondary | ICD-10-CM | POA: Diagnosis present

## 2020-08-29 DIAGNOSIS — Z7401 Bed confinement status: Secondary | ICD-10-CM | POA: Diagnosis not present

## 2020-08-29 LAB — CBC WITH DIFFERENTIAL/PLATELET
Abs Immature Granulocytes: 0.01 10*3/uL (ref 0.00–0.07)
Basophils Absolute: 0.1 10*3/uL (ref 0.0–0.1)
Basophils Relative: 1 %
Eosinophils Absolute: 0.2 10*3/uL (ref 0.0–0.5)
Eosinophils Relative: 3 %
HCT: 33.8 % — ABNORMAL LOW (ref 36.0–46.0)
Hemoglobin: 10.2 g/dL — ABNORMAL LOW (ref 12.0–15.0)
Immature Granulocytes: 0 %
Lymphocytes Relative: 34 %
Lymphs Abs: 1.8 10*3/uL (ref 0.7–4.0)
MCH: 30.3 pg (ref 26.0–34.0)
MCHC: 30.2 g/dL (ref 30.0–36.0)
MCV: 100.3 fL — ABNORMAL HIGH (ref 80.0–100.0)
Monocytes Absolute: 0.4 10*3/uL (ref 0.1–1.0)
Monocytes Relative: 8 %
Neutro Abs: 2.9 10*3/uL (ref 1.7–7.7)
Neutrophils Relative %: 54 %
Platelets: 219 10*3/uL (ref 150–400)
RBC: 3.37 MIL/uL — ABNORMAL LOW (ref 3.87–5.11)
RDW: 13.1 % (ref 11.5–15.5)
WBC: 5.4 10*3/uL (ref 4.0–10.5)
nRBC: 0 % (ref 0.0–0.2)

## 2020-08-29 LAB — URINALYSIS, ROUTINE W REFLEX MICROSCOPIC
Glucose, UA: NEGATIVE mg/dL
Hgb urine dipstick: NEGATIVE
Ketones, ur: NEGATIVE mg/dL
Leukocytes,Ua: NEGATIVE
Nitrite: NEGATIVE
Protein, ur: NEGATIVE mg/dL
Specific Gravity, Urine: >1.03 — ABNORMAL HIGH (ref 1.005–1.030)
pH: 5.5 (ref 5.0–8.0)

## 2020-08-29 LAB — COMPREHENSIVE METABOLIC PANEL
ALT: 6 U/L (ref 0–44)
AST: 15 U/L (ref 15–41)
Albumin: 3.8 g/dL (ref 3.5–5.0)
Alkaline Phosphatase: 66 U/L (ref 38–126)
Anion gap: 12 (ref 5–15)
BUN: 57 mg/dL — ABNORMAL HIGH (ref 8–23)
CO2: 18 mmol/L — ABNORMAL LOW (ref 22–32)
Calcium: 10.5 mg/dL — ABNORMAL HIGH (ref 8.9–10.3)
Chloride: 110 mmol/L (ref 98–111)
Creatinine, Ser: 3.08 mg/dL — ABNORMAL HIGH (ref 0.44–1.00)
GFR, Estimated: 14 mL/min — ABNORMAL LOW (ref >60)
Glucose, Bld: 77 mg/dL (ref 70–99)
Potassium: 5.4 mmol/L — ABNORMAL HIGH (ref 3.5–5.1)
Sodium: 140 mmol/L (ref 135–145)
Total Bilirubin: 0.5 mg/dL (ref 0.3–1.2)
Total Protein: 6.8 g/dL (ref 6.5–8.1)

## 2020-08-29 LAB — SARS CORONAVIRUS 2 (TAT 6-24 HRS): SARS Coronavirus 2: NEGATIVE

## 2020-08-29 MED ORDER — MONTELUKAST SODIUM 10 MG PO TABS
10.0000 mg | ORAL_TABLET | Freq: Every day | ORAL | Status: DC
  Administered 2020-08-29 – 2020-09-03 (×6): 10 mg via ORAL
  Filled 2020-08-29 (×7): qty 1

## 2020-08-29 MED ORDER — BUSPIRONE HCL 5 MG PO TABS
5.0000 mg | ORAL_TABLET | Freq: Three times a day (TID) | ORAL | Status: DC
  Administered 2020-08-29 – 2020-09-06 (×25): 5 mg via ORAL
  Filled 2020-08-29 (×25): qty 1

## 2020-08-29 MED ORDER — SODIUM CHLORIDE 0.9 % IV SOLN: 1.0000 g | Freq: Once | INTRAVENOUS | Status: DC

## 2020-08-29 MED ORDER — ALBUTEROL SULFATE (2.5 MG/3ML) 0.083% IN NEBU: 2.5000 mg | INHALATION_SOLUTION | Freq: Four times a day (QID) | RESPIRATORY_TRACT | Status: DC | PRN

## 2020-08-29 MED ORDER — SODIUM CHLORIDE 0.9% FLUSH
3.0000 mL | Freq: Two times a day (BID) | INTRAVENOUS | Status: DC
  Administered 2020-08-30 – 2020-09-06 (×11): 3 mL via INTRAVENOUS

## 2020-08-29 MED ORDER — ESCITALOPRAM OXALATE 10 MG PO TABS
10.0000 mg | ORAL_TABLET | Freq: Every evening | ORAL | Status: DC
  Administered 2020-08-29 – 2020-09-06 (×9): 10 mg via ORAL
  Filled 2020-08-29 (×9): qty 1

## 2020-08-29 MED ORDER — MEMANTINE HCL ER 28 MG PO CP24
28.0000 mg | ORAL_CAPSULE | Freq: Every morning | ORAL | Status: DC
  Administered 2020-08-30 – 2020-09-06 (×8): 28 mg via ORAL
  Filled 2020-08-29 (×9): qty 1

## 2020-08-29 MED ORDER — SODIUM CHLORIDE 0.9 % IV SOLN: INTRAVENOUS | Status: DC

## 2020-08-29 MED ORDER — HEPARIN SODIUM (PORCINE) 5000 UNIT/ML IJ SOLN
5000.0000 [IU] | Freq: Three times a day (TID) | INTRAMUSCULAR | Status: DC
  Administered 2020-08-29 – 2020-09-06 (×25): 5000 [IU] via SUBCUTANEOUS
  Filled 2020-08-29 (×25): qty 1

## 2020-08-29 MED ORDER — PRAVASTATIN SODIUM 10 MG PO TABS
10.0000 mg | ORAL_TABLET | Freq: Every day | ORAL | Status: DC
  Administered 2020-08-29 – 2020-09-03 (×6): 10 mg via ORAL
  Filled 2020-08-29 (×6): qty 1

## 2020-08-29 MED ORDER — ONDANSETRON HCL 4 MG/2ML IJ SOLN: 4.0000 mg | Freq: Four times a day (QID) | INTRAMUSCULAR | Status: DC | PRN

## 2020-08-29 MED ORDER — DONEPEZIL HCL 10 MG PO TABS
10.0000 mg | ORAL_TABLET | Freq: Every day | ORAL | Status: DC
  Administered 2020-08-29 – 2020-09-06 (×9): 10 mg via ORAL
  Filled 2020-08-29 (×10): qty 1

## 2020-08-29 MED ORDER — ACETAMINOPHEN 325 MG PO TABS: 650.0000 mg | ORAL_TABLET | Freq: Four times a day (QID) | ORAL | Status: DC | PRN

## 2020-08-29 MED ORDER — ACETAMINOPHEN 650 MG RE SUPP: 650.0000 mg | Freq: Four times a day (QID) | RECTAL | Status: DC | PRN

## 2020-08-29 MED ORDER — DEXTROSE 50 % IV SOLN: 1.0000 | Freq: Once | INTRAVENOUS | Status: DC | PRN

## 2020-08-29 MED ORDER — ENSURE ENLIVE PO LIQD
237.0000 mL | Freq: Two times a day (BID) | ORAL | Status: DC
  Administered 2020-08-30 (×2): 237 mL via ORAL
  Filled 2020-08-29: qty 237

## 2020-08-29 MED ORDER — ONDANSETRON HCL 4 MG PO TABS: 4.0000 mg | ORAL_TABLET | Freq: Four times a day (QID) | ORAL | Status: DC | PRN

## 2020-08-29 MED ORDER — SODIUM CHLORIDE 0.9 % IV BOLUS
1000.0000 mL | Freq: Once | INTRAVENOUS | Status: AC
  Administered 2020-08-29: 1000 mL via INTRAVENOUS

## 2020-08-29 NOTE — ED Provider Notes (Signed)
[facility] EMERGENCY DEPARTMENT Provider Note   CSN: [id number] Arrival date & time: 08/29/20  1205     History Chief Complaint  Patient presents with   Weakness    [name] is a 87 y.o. female.  HPI Patient presents with weakness. Patient does have dementia which complicates her history somewhat, as well as a history of CKD and multiple other medical problems.  Line patient denies pain, focal weakness, confusion, but is slow to provide answers to questions, has clear difficulty with her own history, level 5 caveat secondary to dementia.     Past Medical History:  Diagnosis Date   Asthma    CKD (chronic kidney disease) stage 3, GFR 30-59 ml/min (HCC) 05/01/2018   Colostomy present ([place])    hx/notes 07/17/2010   Dementia ([place])    Diabetic neuropathy ([place])    Hypertension    hx/notes 07/17/2010   Pneumonia    recent/notes 10/08/2016   Symptomatic bradycardia 12/15/2017   Type II diabetes mellitus ([place])    hx/notes 07/17/2010    Patient Active Problem List   Diagnosis Date Noted   Acute kidney injury superimposed on chronic kidney disease ([place]) 08/29/2020   COVID-19 virus infection 04/07/2020   Pacemaker 12/21/2019   UTI (urinary tract infection) 05/11/2019   ARF (acute renal failure) ([place]) 05/10/2019   Acute lower UTI 05/10/2019   Symptomatic bradycardia 05/01/2018   Near syncope 05/01/2018   CKD (chronic kidney disease) stage 3, GFR 30-59 ml/min 05/01/2018   Influenza B 04/10/2018   Generalized weakness 04/10/2018   Essential hypertension 03/01/2018   COPD (chronic obstructive pulmonary disease) ([place]) 03/01/2018   Bradycardia 12/15/2017   AKI (acute kidney injury) ([place]) 07/18/2017   Colostomy present ([place]) 11/08/2016   Syncope 10/08/2016   Acute kidney injury ([place]) 10/08/2016   Abnormal chest x-ray 10/08/2016   Lethargy 10/08/2016   Dementia ([place])    Sepsis ([place]) 09/05/2016   Community acquired pneumonia 09/05/2016   Acute encephalopathy  09/05/2016   Diabetes mellitus with complication ([place]) [id number]   Nausea & vomiting 04/09/2012   Gallstones 03/15/2011   Partial SBO 03/15/2011   Colostomy hernia ([place]) 03/15/2011   PSEUDOGOUT 02/28/2007   IRON DEFIC ANEMIA [place] DIET IRON INTAKE 02/28/2007   DISCITIS 02/28/2007   OSTEOMYELITIS 02/28/2007   CHILLS WITHOUT FEVER 02/28/2007    Past Surgical History:  Procedure Laterality Date   ABDOMINAL HYSTERECTOMY     COLOSTOMY     S/P SBO hx/notes 07/17/2010   LUMBAR FUSION  11/2006   hx/notes 07/17/2010   NASAL SINUS SURGERY  04/28/2002   Bilateral inferior turbinate reductions, bilateral maxillary antrotomies, bilateral total ethmoidectomies, bilateral frontal recess explorations, bilateral sphenoidotomies, Instatrac guidance./notes 07/28/2010   PACEMAKER IMPLANT N/A 05/02/2018   Procedure: PACEMAKER IMPLANT;  Surgeon: Evans Lance, MD;  Location: [facility];  Service: Cardiovascular;  Laterality: N/A;     OB History   No obstetric history on file.     Family History  Problem Relation Age of Onset   Kidney disease Mother    Hypertension Mother    Other Father        gangrene with amputation   Diabetes Sister    Cancer Brother    Heart disease Brother    Schizophrenia Daughter    Diabetes Daughter    Stroke Daughter    Diabetes Sister    Breast cancer Neg Hx     Social History   Tobacco Use   Smoking status:  Never   Smokeless tobacco: Former    Types: Chew    Quit date: 02/27/2017  Vaping Use   Vaping Use: Never used  Substance Use Topics   Alcohol use: No   Drug use: No    Home Medications Prior to Admission medications   Medication Sig Start Date End Date Taking? Authorizing Provider  acetaminophen (TYLENOL) 500 MG tablet Take 500 mg by mouth daily as needed for fever or headache (pain).    [provider]  albuterol (VENTOLIN HFA) 108 (90 Base) MCG/ACT inhaler Inhale 2 puffs into the lungs every 4 (four) hours as needed for wheezing  or shortness of breath. 04/29/20   Medina-Vargas, Monina C, NP  alendronate (FOSAMAX) 70 MG tablet Take 1 tablet (70 mg total) by mouth every Sunday. 05/05/20   Medina-Vargas, Monina C, NP  busPIRone (BUSPAR) 5 MG tablet Take 1 tablet (5 mg total) by mouth 3 (three) times daily. 04/29/20   Medina-Vargas, Monina C, NP  Calcium Carb-Cholecalciferol (CALCIUM 600-D PO) Take 1 tablet by mouth every morning.    [provider]  donepezil (ARICEPT) 10 MG tablet Take 1 tablet (10 mg total) by mouth at bedtime. 04/29/20   Medina-Vargas, Monina C, NP  escitalopram (LEXAPRO) 10 MG tablet Take 1 tablet (10 mg total) by mouth every evening. 04/29/20   Medina-Vargas, Monina C, NP  feeding supplement (ENSURE ENLIVE / ENSURE PLUS) LIQD Take 237 mLs by mouth 2 (two) times daily between meals. 04/11/20   Pokhrel, Corrie Mckusick, MD  FLOVENT HFA 220 MCG/ACT inhaler Inhale 1 puff into the lungs 2 (two) times daily. 04/29/20   Medina-Vargas, Monina C, NP  memantine (NAMENDA XR) 28 MG CP24 24 hr capsule Take 1 capsule (28 mg total) by mouth every morning. 04/29/20   Medina-Vargas, Monina C, NP  metFORMIN (GLUCOPHAGE-XR) 500 MG 24 hr tablet Take 1 tablet (500 mg total) by mouth daily with supper. 04/29/20   Medina-Vargas, Monina C, NP  montelukast (SINGULAIR) 10 MG tablet Take 1 tablet (10 mg total) by mouth at bedtime. 04/29/20   Medina-Vargas, Monina C, NP  ondansetron (ZOFRAN) 4 MG tablet Take 1 tablet (4 mg total) by mouth every 12 (twelve) hours as needed for nausea or vomiting. 04/29/20   Medina-Vargas, Monina C, NP  pravastatin (PRAVACHOL) 10 MG tablet Take 1 tablet (10 mg total) by mouth at bedtime. 04/29/20   Medina-Vargas, Monina C, NP    Allergies    Aspirin  Review of Systems   Review of Systems  Constitutional:        Per HPI, otherwise negative  HENT:         Per HPI, otherwise negative  Respiratory:         Per HPI, otherwise negative  Cardiovascular:        Per HPI, otherwise negative  Gastrointestinal:   Negative for vomiting.  Endocrine:       Negative aside from HPI  Genitourinary:        Neg aside from HPI   Musculoskeletal:        Per HPI, otherwise negative  Skin: Negative.   Neurological:  Positive for weakness. Negative for syncope.   Physical Exam Updated Vital Signs BP 105/76   Pulse 61   Temp 97.8 F (36.6 C) (Oral)   Resp 16   SpO2 97%   Physical Exam Vitals and nursing note reviewed.  Constitutional:      General: She is not in acute distress.    Appearance: She is  well-developed.  HENT:     Head: Normocephalic and atraumatic.  Eyes:     Conjunctiva/sclera: Conjunctivae normal.  Cardiovascular:     Rate and Rhythm: Normal rate and regular rhythm.  Pulmonary:     Effort: Pulmonary effort is normal. No respiratory distress.     Breath sounds: Normal breath sounds. No stridor.  Abdominal:     General: There is no distension.  Skin:    General: Skin is warm and dry.  Neurological:     Mental Status: She is alert and oriented to person, place, and time.     Cranial Nerves: No cranial nerve deficit.    ED Results / Procedures / Treatments   Labs (all labs ordered are listed, but only abnormal results are displayed) Labs Reviewed  COMPREHENSIVE METABOLIC PANEL - Abnormal; Notable for the following components:      Result Value   Potassium 5.4 (*)    CO2 18 (*)    BUN 57 (*)    Creatinine, Ser 3.08 (*)    Calcium 10.5 (*)    GFR, Estimated 14 (*)    All other components within normal limits  URINALYSIS, ROUTINE W REFLEX MICROSCOPIC - Abnormal; Notable for the following components:   Specific Gravity, Urine >1.030 (*)    Bilirubin Urine MODERATE (*)    All other components within normal limits  CBC WITH DIFFERENTIAL/PLATELET - Abnormal; Notable for the following components:   RBC 3.37 (*)    Hemoglobin 10.2 (*)    HCT 33.8 (*)    MCV 100.3 (*)    All other components within normal limits  SARS CORONAVIRUS 2 (TAT 6-24 HRS)  CBC WITH  DIFFERENTIAL/PLATELET  CBC WITH DIFFERENTIAL/PLATELET  CREATININE, URINE, RANDOM  SODIUM, URINE, RANDOM    EKG EKG Interpretation  Date/Time:  Thursday August 29 2020 13:35:06 EDT Ventricular Rate:  60 PR Interval:  207 QRS Duration: 103 QT Interval:  443 QTC Calculation: 443 R Axis:   -44 Text Interpretation: Sinus rhythm Incomplete RBBB and LAFB Abnormal R-wave progression, early transition Baseline wander Artifact Abnormal ECG Confirmed by Carmin Muskrat [phone]) on 08/29/2020 3:23:16 PM  Radiology DG Chest Port 1 View  Result Date: 08/29/2020 CLINICAL DATA:  Weakness EXAM: PORTABLE CHEST 1 VIEW COMPARISON:  None. FINDINGS: The heart size and mediastinal contours are within normal limits. Left chest wall dual lead pacemaker. Both lungs are clear. No pleural effusion. IMPRESSION: No acute process in the chest. Electronically Signed   By: Macy Mis M.D.   On: 08/29/2020 13:47    Procedures Procedures   Medications Ordered in ED Medications  heparin injection 5,000 Units (has no administration in time range)  sodium chloride flush (NS) 0.9 % injection 3 mL (has no administration in time range)  acetaminophen (TYLENOL) tablet 650 mg (has no administration in time range)    Or  acetaminophen (TYLENOL) suppository 650 mg (has no administration in time range)  0.9 %  sodium chloride infusion (has no administration in time range)  ondansetron (ZOFRAN) tablet 4 mg (has no administration in time range)    Or  ondansetron (ZOFRAN) injection 4 mg (has no administration in time range)  albuterol (PROVENTIL) (2.5 MG/3ML) 0.083% nebulizer solution 2.5 mg (has no administration in time range)  sodium chloride 0.9 % bolus 1,000 mL (1,000 mLs Intravenous New Bag/Given 08/29/20 1514)    ED Course  I have reviewed the triage vital signs and the nursing notes.  Pertinent labs & imaging results that were available during  my care of the patient were reviewed by me and considered in my  medical decision making (see chart for details).  On repeat exam the patient is awake and alert, in no distress, continues to have no insight into her recent history beyond generalized weakness.  She denies any focal pain. I reviewed her labs, x-ray, EKG, discussed them with her.  Patient found to have acute kidney injury, in comparison to renal function from 4 months ago creatinine now more than doubled. She is receiving fluid resuscitation, given her presentation for weakness, acute kidney injury will require admission for further monitoring, management. MDM Rules/Calculators/A&P MDM Number of Diagnoses or Management Options AKI (acute kidney injury) ([place]): new, needed workup   Amount and/or Complexity of Data Reviewed Clinical lab tests: reviewed and ordered Tests in the radiology section of CPT: ordered and reviewed Tests in the medicine section of CPT: ordered and reviewed Decide to obtain previous medical records or to obtain history from someone other than the patient: yes Review and summarize past medical records: yes Discuss the patient with other providers: yes Independent visualization of images, tracings, or specimens: yes  Risk of Complications, Morbidity, and/or Mortality Presenting problems: high Diagnostic procedures: high Management options: high  Critical Care Total time providing critical care: < 30 minutes  Patient Progress Patient progress: stable   Final Clinical Impression(s) / ED Diagnoses Final diagnoses:  AKI (acute kidney injury) ([place])     Carmin Muskrat, MD 08/29/20 1528

## 2020-08-29 NOTE — H&P (Addendum)
History and Physical    [name] [id number] DOB: 12-18-1933 DOA: 08/29/2020  Referring MD/NP/PA: Carmin Muskrat, MD PCP: Leeroy Cha, MD  Patient coming from: Home  Chief Complaint: Weakness  I have personally briefly reviewed patient's old medical records in [place]   HPI: [name] is a 87 y.o. female with medical history significant of dementia, chronic diastolic heart failure, symptomatic bradycardia s/p PPM, s/pcolostomy, dementia, and CKD stage IIIb presented with complaints of weakness.  History is obtained from the patient's granddaughter over the phone as the patient has significant dementia. The patient has not been really eating for quite some time, but over the last week she had stopping drinking over the last week.  She had not been having any nausea, vomiting, or diarrhea.  Over the last few days her granddaughter had not seen her urinate which made her more concerned.  Notes that the patient had been sleeping all day and up all night.  Patient had been admitted into the hospital with similar symptoms back in January of this year, but was also found to have COVID-19 at that time.  Her kidney function improved with IV fluids.  The patient denies any complaints and just states that she got sick all of a sudden.  ED Course: Upon admission into the emergency department patient was seen to be with vital signs relatively within normal limits.  Labs significant for hemoglobin 10.2, MCV 100.3, potassium 5.4, CO2 18, BUN 57, creatinine 3.08, and glucose 77.  Chest x-ray showed no acute abnormalities.  Urinalysis was positive for elevated specific gravity>1.03.   Patient was given 1 L normal saline IV fluids.  TRH called to admit.  Review of Systems  Neurological:  Negative for weakness.   Past Medical History:  Diagnosis Date   Asthma    CKD (chronic kidney disease) stage 3, GFR 30-59 ml/min (HCC) 05/01/2018   Colostomy present ([place])    hx/notes  07/17/2010   Dementia ([place])    Diabetic neuropathy ([place])    Hypertension    hx/notes 07/17/2010   Pneumonia    recent/notes 10/08/2016   Symptomatic bradycardia 12/15/2017   Type II diabetes mellitus ([place])    hx/notes 07/17/2010    Past Surgical History:  Procedure Laterality Date   ABDOMINAL HYSTERECTOMY     COLOSTOMY     S/P SBO hx/notes 07/17/2010   LUMBAR FUSION  11/2006   hx/notes 07/17/2010   NASAL SINUS SURGERY  04/28/2002   Bilateral inferior turbinate reductions, bilateral maxillary antrotomies, bilateral total ethmoidectomies, bilateral frontal recess explorations, bilateral sphenoidotomies, [employer] guidance./notes 07/28/2010   PACEMAKER IMPLANT N/A 05/02/2018   Procedure: PACEMAKER IMPLANT;  Surgeon: Evans Lance, MD;  Location: [facility];  Service: Cardiovascular;  Laterality: N/A;     reports that she has never smoked. She quit smokeless tobacco use about 3 years ago.  Her smokeless tobacco use included chew. She reports that she does not drink alcohol and does not use drugs.  Allergies  Allergen Reactions   Aspirin Other (See Comments)    Triggers asthma    Family History  Problem Relation Age of Onset   Kidney disease Mother    Hypertension Mother    Other Father        gangrene with amputation   Diabetes Sister    Cancer Brother    Heart disease Brother    Schizophrenia Daughter    Diabetes Daughter    Stroke Daughter    Diabetes  Sister    Breast cancer Neg Hx     Prior to Admission medications   Medication Sig Start Date End Date Taking? Authorizing Provider  acetaminophen (TYLENOL) 500 MG tablet Take 500 mg by mouth daily as needed for fever or headache (pain).    [provider]  albuterol (VENTOLIN HFA) 108 (90 Base) MCG/ACT inhaler Inhale 2 puffs into the lungs every 4 (four) hours as needed for wheezing or shortness of breath. 04/29/20   Medina-Vargas, Monina C, NP  alendronate (FOSAMAX) 70 MG tablet Take 1 tablet (70 mg total) by  mouth every Sunday. 05/05/20   Medina-Vargas, Monina C, NP  busPIRone (BUSPAR) 5 MG tablet Take 1 tablet (5 mg total) by mouth 3 (three) times daily. 04/29/20   Medina-Vargas, Monina C, NP  Calcium Carb-Cholecalciferol (CALCIUM 600-D PO) Take 1 tablet by mouth every morning.    [provider]  donepezil (ARICEPT) 10 MG tablet Take 1 tablet (10 mg total) by mouth at bedtime. 04/29/20   Medina-Vargas, Monina C, NP  escitalopram (LEXAPRO) 10 MG tablet Take 1 tablet (10 mg total) by mouth every evening. 04/29/20   Medina-Vargas, Monina C, NP  feeding supplement (ENSURE ENLIVE / ENSURE PLUS) LIQD Take 237 mLs by mouth 2 (two) times daily between meals. 04/11/20   Pokhrel, Corrie Mckusick, MD  FLOVENT HFA 220 MCG/ACT inhaler Inhale 1 puff into the lungs 2 (two) times daily. 04/29/20   Medina-Vargas, Monina C, NP  memantine (NAMENDA XR) 28 MG CP24 24 hr capsule Take 1 capsule (28 mg total) by mouth every morning. 04/29/20   Medina-Vargas, Monina C, NP  metFORMIN (GLUCOPHAGE-XR) 500 MG 24 hr tablet Take 1 tablet (500 mg total) by mouth daily with supper. 04/29/20   Medina-Vargas, Monina C, NP  montelukast (SINGULAIR) 10 MG tablet Take 1 tablet (10 mg total) by mouth at bedtime. 04/29/20   Medina-Vargas, Monina C, NP  ondansetron (ZOFRAN) 4 MG tablet Take 1 tablet (4 mg total) by mouth every 12 (twelve) hours as needed for nausea or vomiting. 04/29/20   Medina-Vargas, Monina C, NP  pravastatin (PRAVACHOL) 10 MG tablet Take 1 tablet (10 mg total) by mouth at bedtime. 04/29/20   Medina-Vargas, Monina C, NP    Physical Exam:  Constitutional: Elderly female who appears to be in no acute Vitals:   08/29/20 1245 08/29/20 1315 08/29/20 1417  BP: 129/75 105/76   Pulse: (!) 59 61   Resp: 15 16   Temp:   97.8 F (36.6 C)  TempSrc:   Oral  SpO2: 97% 97%    Eyes: PERRL, lids and conjunctivae normal ENMT: Mucous membranes are dry. Posterior pharynx clear of any exudate or lesions.  Neck: normal, supple, no masses, no  thyromegaly Respiratory: clear to auscultation bilaterally, no wheezing, no crackles. Normal respiratory effort. No accessory muscle use.  Cardiovascular: Regular rate and rhythm, no murmurs / rubs / gallops. No extremity edema. 2+ pedal pulses. No carotid bruits.  Abdomen: no tenderness, no masses palpated. No hepatosplenomegaly. Bowel sounds positive.  Musculoskeletal: no clubbing / cyanosis. No joint deformity upper and lower extremities. Good ROM, no contractures. Normal muscle tone.  Skin: Poor skin turgor Neurologic: CN 2-12 grossly intact. Sensation intact, DTR normal. Strength 5/5 in all 4.  Psychiatric: Normal judgment and insight. Alert and oriented x 3. Normal mood.     Labs on Admission: I have personally reviewed following labs and imaging studies  CBC: Recent Labs  Lab 08/29/20 1406  WBC 5.4  NEUTROABS 2.9  HGB 10.2*  HCT 33.8*  MCV 100.3*  PLT [id number]   Basic Metabolic Panel: Recent Labs  Lab 08/29/20 1300  NA 140  K 5.4*  CL 110  CO2 18*  GLUCOSE 77  BUN 57*  CREATININE 3.08*  CALCIUM 10.5*   GFR: CrCl cannot be calculated (Unknown ideal weight.). Liver Function Tests: Recent Labs  Lab 08/29/20 1300  AST 15  ALT 6  ALKPHOS 66  BILITOT 0.5  PROT 6.8  ALBUMIN 3.8   No results for input(s): LIPASE, AMYLASE in the last 168 hours. No results for input(s): AMMONIA in the last 168 hours. Coagulation Profile: No results for input(s): INR, PROTIME in the last 168 hours. Cardiac Enzymes: No results for input(s): CKTOTAL, CKMB, CKMBINDEX, TROPONINI in the last 168 hours. BNP (last 3 results) No results for input(s): PROBNP in the last 8760 hours. HbA1C: No results for input(s): HGBA1C in the last 72 hours. CBG: No results for input(s): GLUCAP in the last 168 hours. Lipid Profile: No results for input(s): CHOL, HDL, LDLCALC, TRIG, CHOLHDL, LDLDIRECT in the last 72 hours. Thyroid Function Tests: No results for input(s): TSH, T4TOTAL, FREET4, T3FREE,  THYROIDAB in the last 72 hours. Anemia Panel: No results for input(s): VITAMINB12, FOLATE, FERRITIN, TIBC, IRON, RETICCTPCT in the last 72 hours. Urine analysis:    Component Value Date/Time   COLORURINE YELLOW 08/29/2020 1300   APPEARANCEUR CLEAR 08/29/2020 1300   LABSPEC >1.030 (H) 08/29/2020 1300   PHURINE 5.5 08/29/2020 1300   GLUCOSEU NEGATIVE 08/29/2020 1300   HGBUR NEGATIVE 08/29/2020 1300   BILIRUBINUR MODERATE (A) 08/29/2020 1300   KETONESUR NEGATIVE 08/29/2020 1300   PROTEINUR NEGATIVE 08/29/2020 1300   UROBILINOGEN 1.0 01/09/2015 1620   NITRITE NEGATIVE 08/29/2020 1300   LEUKOCYTESUR NEGATIVE 08/29/2020 1300   Sepsis Labs: No results found for this or any previous visit (from the past 240 hour(s)).   Radiological Exams on Admission: DG Chest Port 1 View  Result Date: 08/29/2020 CLINICAL DATA:  Weakness EXAM: PORTABLE CHEST 1 VIEW COMPARISON:  None. FINDINGS: The heart size and mediastinal contours are within normal limits. Left chest wall dual lead pacemaker. Both lungs are clear. No pleural effusion. IMPRESSION: No acute process in the chest. Electronically Signed   By: Macy Mis M.D.   On: 08/29/2020 13:47    EKG: Independently reviewed.  Sinus rhythm at 60 bpm with incomplete RBBB and LAFB  Assessment/Plan Acute kidney injury superimposed on chronic kidney disease stage IIIb with metabolic acidosis: Patient reportedly has not been eating and recently stopped drinking fluids.  Presents with creatinine elevated up to 3.08 with BUN 57 and CO2 18.  Anion gap was within normal limits.  Baseline creatinine previously noted to be 1.3-1.5.  Physical exam patient with poor skin turgor along with urinalysis with elevated specific gravity is consistent with dehydration. -Admit to medical telemetry bed -Encourage fluid -Normal saline IV fluids at 100 mL/h -Recheck kidney function tomorrow morning  Hyperkalemia: Acute.  Potassium mildly elevated at 5.4.  EKG -IV  fluids -Recheck potassium levels in a.m.  Dementia: Patient is alert and oriented only to person and place but not time. -Delirium precautions -Continue donepezil, Namenda BuSpar, -Palliative care consulted  Macrocytic anemia: On admission hemoglobin 10.2 which appears around patient's baseline, but hemoglobin may be lower in the setting of dehydration. -Check folate, vitamin B12, and CBC in a.m.  Diabetes mellitus type 2: On admission glucose was noted to be 77.  Last hemoglobin A1c on 04/07/2020 was 5.7.  Home  medication regimen included Glucophage XR 500 mg -Hypoglycemic protocols -Hold metformin -CBGs before every meal and at bedtime -Amp of D50 as needed for low blood sugars  Hypercalcemia: Acute.  Calcium level mildly elevated at 10.5.  She is on calcium supplements. -Hold calcium supplement -IV fluids -Recheck calcium levels in a.m.  History of symptomatic bradycardia s/p pacemaker: Pacemaker placed in 2020.  S/p colostomy: Resulted after small bowel obstruction in 2012 -Ostomy care as needed  COPD, without exacerbation: Patient lungs sound clear at this time.  Chest x-ray showed no acute abnormality. -Albuterol inhaler as needed for shortness of breath/wheezing  History of COVID-19 infection: 03/2020  DVT prophylaxis: Heparin Code Status: Full Family Communication: Granddaughter updated over the phone Disposition Plan: To be determined Consults called: Palliative care Admission status: Inpatient, require more than 2 midnight stay for need of IV hydration for acute kidney injury  Norval Morton MD [facility]   If 7PM-7AM, please contact night-coverage   08/29/2020, 3:09 PM

## 2020-08-30 DIAGNOSIS — Z515 Encounter for palliative care: Secondary | ICD-10-CM

## 2020-08-30 DIAGNOSIS — Z7189 Other specified counseling: Secondary | ICD-10-CM

## 2020-08-30 DIAGNOSIS — E43 Unspecified severe protein-calorie malnutrition: Secondary | ICD-10-CM | POA: Insufficient documentation

## 2020-08-30 DIAGNOSIS — E440 Moderate protein-calorie malnutrition: Secondary | ICD-10-CM | POA: Insufficient documentation

## 2020-08-30 LAB — FOLATE: Folate: 10.5 ng/mL (ref >5.9)

## 2020-08-30 LAB — CBC WITH DIFFERENTIAL/PLATELET
Abs Immature Granulocytes: 0.02 10*3/uL (ref 0.00–0.07)
Basophils Absolute: 0 10*3/uL (ref 0.0–0.1)
Basophils Relative: 1 %
Eosinophils Absolute: 0.2 10*3/uL (ref 0.0–0.5)
Eosinophils Relative: 4 %
HCT: 33 % — ABNORMAL LOW (ref 36.0–46.0)
Hemoglobin: 10.4 g/dL — ABNORMAL LOW (ref 12.0–15.0)
Immature Granulocytes: 0 %
Lymphocytes Relative: 36 %
Lymphs Abs: 2 10*3/uL (ref 0.7–4.0)
MCH: 30.4 pg (ref 26.0–34.0)
MCHC: 31.5 g/dL (ref 30.0–36.0)
MCV: 96.5 fL (ref 80.0–100.0)
Monocytes Absolute: 0.4 10*3/uL (ref 0.1–1.0)
Monocytes Relative: 8 %
Neutro Abs: 2.9 10*3/uL (ref 1.7–7.7)
Neutrophils Relative %: 51 %
Platelets: 221 10*3/uL (ref 150–400)
RBC: 3.42 MIL/uL — ABNORMAL LOW (ref 3.87–5.11)
RDW: 12.9 % (ref 11.5–15.5)
WBC: 5.7 10*3/uL (ref 4.0–10.5)
nRBC: 0 % (ref 0.0–0.2)

## 2020-08-30 LAB — GLUCOSE, CAPILLARY
Glucose-Capillary: 117 mg/dL — ABNORMAL HIGH (ref 70–99)
Glucose-Capillary: 65 mg/dL — ABNORMAL LOW (ref 70–99)
Glucose-Capillary: 142 mg/dL — ABNORMAL HIGH (ref 70–99)
Glucose-Capillary: 165 mg/dL — ABNORMAL HIGH (ref 70–99)
Glucose-Capillary: 146 mg/dL — ABNORMAL HIGH (ref 70–99)

## 2020-08-30 LAB — BASIC METABOLIC PANEL
Anion gap: 9 (ref 5–15)
BUN: 46 mg/dL — ABNORMAL HIGH (ref 8–23)
CO2: 18 mmol/L — ABNORMAL LOW (ref 22–32)
Calcium: 9.5 mg/dL (ref 8.9–10.3)
Chloride: 110 mmol/L (ref 98–111)
Creatinine, Ser: 2.26 mg/dL — ABNORMAL HIGH (ref 0.44–1.00)
GFR, Estimated: 21 mL/min — ABNORMAL LOW (ref >60)
Glucose, Bld: 68 mg/dL — ABNORMAL LOW (ref 70–99)
Potassium: 4.9 mmol/L (ref 3.5–5.1)
Sodium: 137 mmol/L (ref 135–145)

## 2020-08-30 LAB — VITAMIN B12: Vitamin B-12: 206 pg/mL (ref 180–914)

## 2020-08-30 MED ORDER — ENSURE ENLIVE PO LIQD
237.0000 mL | Freq: Three times a day (TID) | ORAL | Status: DC
  Administered 2020-08-30 – 2020-09-06 (×20): 237 mL via ORAL

## 2020-08-30 MED ORDER — SODIUM CHLORIDE 0.9 % IV SOLN: INTRAVENOUS | Status: DC

## 2020-08-30 MED ORDER — ADULT MULTIVITAMIN W/MINERALS CH
1.0000 | ORAL_TABLET | Freq: Every day | ORAL | Status: DC
  Administered 2020-08-30 – 2020-09-06 (×8): 1 via ORAL
  Filled 2020-08-30 (×8): qty 1

## 2020-08-30 NOTE — Progress Notes (Signed)
Initial Nutrition Assessment  DOCUMENTATION CODES:  Severe malnutrition in context of acute illness/injury, Non-severe (moderate) malnutrition in context of chronic illness  INTERVENTION:  Obtain updated weight.  Add Ensure Enlive po TID, each supplement provides 350 kcal and 20 grams of protein.  Add Magic cup TID with meals, each supplement provides 290 kcal and 9 grams of protein.  Add MVI with minerals daily.  Encourage PO and supplement intake.  NUTRITION DIAGNOSIS:  Severe Malnutrition related to acute illness, lethargy/confusion as evidenced by energy intake < or equal to 50% for > or equal to 5 days, mild fat depletion, moderate fat depletion, moderate muscle depletion, severe muscle depletion.  GOAL:  Patient will meet greater than or equal to 90% of their needs  MONITOR:  PO intake, Supplement acceptance, Labs, Weight trends, I & O's  REASON FOR ASSESSMENT:  Malnutrition Screening Tool    ASSESSMENT:  87 yo female with a PMH of dementia, chronic diastolic CHF, symptomatic bradycardia s/p PPM, s/p colostomy, and CKD stage IIIb who presents with AKI superimposed on CKD.  Palliative following. Daughter to make decision regarding code status/hospice tomorrow.  Per H&P, "History is obtained from the patient's granddaughter over the phone as the patient has significant dementia. The patient has not been really eating for quite some time, but over the last week she had stopping drinking over the last week.  She had not been having any nausea, vomiting, or diarrhea.  Over the last few days her granddaughter had not seen her urinate which made her more concerned."  Spoke with pt at bedside. Pt in pleasant spirits. She was working on eating lunch during RD visit, taking slow, careful bites of her salmon. She denies any issues with chewing or swallowing. She reports eating well at home (obvious confusion given dementia and granddaughter's report).  Pt reports that she has not  noticed any changes in her weight. Per Epic, pt does not have a more recent weight than 57.9 kg from 04/26/20. Recommend reweighing pt. RD to use 57.9 kg to estimate needs.  On exam, pt with moderate to severe depletions.  Given the above information, pt with severe malnutrition in the acute setting, likely on chronic.  Recommend adding Ensure Enlive TID, Magic Cup TID, and MVI with minerals daily to replete loses.  Medications: reviewed; EE BID, NaCl @ 100 ml/hr per IV  Labs: reviewed; CBG 65-142 HbA1c: 5.7% (03/2020)  NUTRITION - FOCUSED PHYSICAL EXAM: Flowsheet Row Most Recent Value  Orbital Region Moderate depletion  Upper Arm Region Mild depletion  Thoracic and Lumbar Region Mild depletion  Buccal Region Moderate depletion  Temple Region Moderate depletion  Clavicle Bone Region Moderate depletion  Clavicle and Acromion Bone Region Moderate depletion  Scapular Bone Region Unable to assess  Dorsal Hand Severe depletion  Patellar Region Severe depletion  Anterior Thigh Region Severe depletion  Posterior Calf Region Severe depletion  Edema (RD Assessment) None  Hair Reviewed  Eyes Reviewed  Mouth Reviewed  Skin Reviewed  Nails Reviewed   Diet Order:   Diet Order             Diet Heart Room service appropriate? Yes with Assist; Fluid consistency: Thin  Diet effective now                  EDUCATION NEEDS:  Education needs have been addressed  Skin:  Skin Assessment: Reviewed RN Assessment  Last BM:  Colostomy - 50 ml output so far  Height:  Ht Readings from  Last 1 Encounters:  04/26/20 '5\' 4"'$  (1.626 m)   Weight:  Wt Readings from Last 1 Encounters:  04/26/20 57.9 kg   Ideal Body Weight:  54.5 kg  BMI:  There is no height or weight on file to calculate BMI.  Estimated Nutritional Needs:  Kcal:  1550-1750 Protein:  65-80 grams Fluid:  >1.5 L  Derrel Nip, RD, LDN Registered Dietitian I After-Hours/Weekend Pager # in [place]

## 2020-08-30 NOTE — Progress Notes (Signed)
[facility] Progress Note  Patient: [name]    [id number]  DOA: 08/29/2020     Date of Service: the patient was seen and examined on 08/30/2020  Brief hospital course: Past medical history of dementia, chronic CHF, bradycardia SP PPM implant, colostomy CKD 3B.  Presents with complaints of generalized weakness.  Found to have AKI on CKD and poor p.o. intake. Currently plan is continue with IV fluids, discharged home with hospice once medically stable.  Assessment and Plan: Acute kidney injury superimposed on chronic kidney disease stage IIIb with metabolic acidosis: Patient reportedly has not been eating and recently stopped drinking fluids.  Presents with creatinine elevated up to 3.08 with BUN 57 and CO2 18.  Anion gap was within normal limits.  Baseline creatinine previously noted to be 1.3-1.5.  Physical exam patient with poor skin turgor along with urinalysis with elevated specific gravity is consistent with dehydration. -Encourage fluid -Normal saline IV fluids at 100 mL/h   Hyperkalemia: Acute.  Potassium mildly elevated at 5.4.  EKG -IV fluids Improving.  Monitor.   Dementia: Patient is alert and oriented only to person and place but not time. -Delirium precautions -Continue donepezil, Namenda BuSpar, -Palliative care consulted plan is to go home with hospice   Macrocytic anemia: On admission hemoglobin 10.2 which appears around patient's baseline,  -low  folate, vitamin B12,    Diabetes mellitus type 2: On admission glucose was noted to be 77.  Last hemoglobin A1c on 04/07/2020 was 5.7.  Home medication regimen included Glucophage XR 500 mg -Hypoglycemic protocols -Hold metformin -CBGs before every meal and at bedtime -Amp of D50 as needed for low blood sugars   Hypercalcemia: Acute.  Calcium level mildly elevated at 10.5.  She is on calcium supplements. -Hold calcium supplement -IV fluids -Recheck calcium levels in a.m.   History of symptomatic bradycardia  s/p pacemaker: Pacemaker placed in 2020.   S/p colostomy: Resulted after small bowel obstruction in 2012 -Ostomy care as needed   COPD, without exacerbation: Patient lungs sound clear at this time.  Chest x-ray showed no acute abnormality. -Albuterol inhaler as needed for shortness of breath/wheezing   History of COVID-19 infection: 03/2020 Nutrition Problem: Severe Malnutrition Etiology: acute illness, lethargy/confusion Interventions: Interventions: Ensure Enlive (each supplement provides 350kcal and 20 grams of protein), Magic cup, MVI  Diet: Regular diet DVT Prophylaxis:   heparin injection 5,000 Units Start: 08/29/20 1530    Advance goals of care discussion: Full code  Family Communication: no family was present at bedside, at the time of interview.  Disposition:  Status is: Inpatient  Remains inpatient appropriate because:IV treatments appropriate due to intensity of illness or inability to take PO  Dispo: The patient is from: Home              Anticipated d/c is to: Home              Patient currently is not medically stable to d/c.   Difficult to place patient No  Subjective: No nausea no vomiting.  No fever no chills.  Minimal oral intake.  Physical Exam:  General: Appear in mild distress, no Rash; Oral Mucosa Clear, moist. no Abnormal Neck Mass Or lumps, Conjunctiva normal  Cardiovascular: S1 and S2 Present, no Murmur, Respiratory: good respiratory effort, Bilateral Air entry present and CTA, no Crackles, no wheezes Abdomen: Bowel Sound present, Soft and no tenderness Extremities: no Pedal edema Neurology: alert and oriented to time, place, and person affect appropriate. no new focal  deficit Gait not checked due to patient safety concerns   Vitals:   08/29/20 2050 08/30/20 0131 08/30/20 0510 08/30/20 1403  BP: (!) 163/62 140/67 (!) 128/99 (!) 117/58  Pulse: (!) 57 60 60 (!) 59  Resp: '16  16 18  '$ Temp: 97.7 F (36.5 C) 98.2 F (36.8 C) 97.7 F (36.5 C) 97.7  F (36.5 C)  TempSrc: Oral Oral Oral Oral  SpO2: 100% 100% 100% 100%    Intake/Output Summary (Last 24 hours) at 08/30/2020 1844 Last data filed at 08/30/2020 1737 Gross per 24 hour  Intake 1704.62 ml  Output 1450 ml  Net 254.62 ml   There were no vitals filed for this visit.  Data Reviewed: I have personally reviewed and interpreted daily labs, tele strips, imaging. I reviewed all nursing notes, pharmacy notes, vitals, pertinent old records I have discussed plan of care as described above with RN and patient/family.  CBC: Recent Labs  Lab 08/29/20 1406 08/30/20 0116  WBC 5.4 5.7  NEUTROABS 2.9 2.9  HGB 10.2* 10.4*  HCT 33.8* 33.0*  MCV 100.3* 96.5  PLT 219 [id number]   Basic Metabolic Panel: Recent Labs  Lab 08/29/20 1300 08/30/20 0116  NA 140 137  K 5.4* 4.9  CL 110 110  CO2 18* 18*  GLUCOSE 77 68*  BUN 57* 46*  CREATININE 3.08* 2.26*  CALCIUM 10.5* 9.5    Studies: No results found.  Scheduled Meds:  busPIRone  5 mg Oral TID   donepezil  10 mg Oral QHS   escitalopram  10 mg Oral QPM   feeding supplement  237 mL Oral TID BM   heparin  5,000 Units Subcutaneous Q8H   memantine  28 mg Oral q morning   montelukast  10 mg Oral QHS   multivitamin with minerals  1 tablet Oral Daily   pravastatin  10 mg Oral QHS   sodium chloride flush  3 mL Intravenous Q12H   Continuous Infusions:  sodium chloride 100 mL/hr at 08/30/20 1751   PRN Meds: acetaminophen **OR** acetaminophen, albuterol, dextrose, ondansetron **OR** ondansetron (ZOFRAN) IV  Time spent: 35 minutes  Author: Berle Mull, MD [facility] 08/30/2020 6:44 PM  To reach On-call, see care teams to locate the attending and reach out via [email or web address]. Between 7PM-7AM, please contact night-coverage If you still have difficulty reaching the attending provider, please page the [facility] (Director on Call) for [facility] on amion for assistance.

## 2020-08-30 NOTE — Progress Notes (Signed)
Hypoglycemic Event  CBG: 65  Treatment: 4 oz juice/soda  Symptoms: None  Follow-up CBG: [id number] CBG Result:117  Possible Reasons for Event: Inadequate meal intake  Comments/MD notified:yes    Ubaldo Glassing D [place]

## 2020-08-30 NOTE — Consult Note (Signed)
Consultation Note Date: 08/30/2020   Patient Name: [name]  DOB: 1933-11-21  MRN: [id number]  Age / Sex: 87 y.o., female  PCP: Leeroy Cha, MD Referring Physician: Lavina Hamman, MD  Reason for Consultation: Establishing goals of care  HPI/Patient Profile: 87 y.o. female  with past medical history of dementia, chronic diastolic heart failure, symptomatic bradycardia s/p PPM, s/p colostomy, and CKD stage IIIb  admitted on 08/29/2020 with weakness. Reports of poor PO intake at home. Found to have creatinine of 3.08 (baseline 1.3-1.5). Admitted for IV fluids. PMT consulted to discuss [place].  Clinical Assessment and Goals of Care: I have reviewed medical records including EPIC notes, labs and imaging, assessed the patient and then spoke with patient's granddaughter, [name], (granddaughter is also caregiver and next of kin)  to discuss diagnosis prognosis, GOC, EOL wishes, disposition and options.  Patient is alert on exam and tells me she feels well. Tray at bedside shows an empty ensure cup and empty orange juice cup however food is untouched. I offer it to her and she declines. Patient unable to participate in goals of care conversations.   I called [name] and I introduced Palliative Medicine as specialized medical care for people living with serious illness. It focuses on providing relief from the symptoms and stress of a serious illness. The goal is to improve quality of life for both the patient and the family.  We discussed a brief life review of the patient. [name] tells me the patient's spouse has passed away and her 2 children have also passed away. She tells me the patient was admitted to the hospital earlier this year with COVID and was d/c'd to rehab. She completed rehab and returned home with [name].  As far as functional and nutritional status, [name] tells me the patient has declined significantly since her hospitalization  in January. She tells me the patient is no longer ambulatory. She tells me of very poor PO intake - going days without eating/drinking. Also going days without urinating.    We discussed patient's current illness and what it means in the larger context of patient's on-going co-morbidities.  Natural disease trajectory and expectations at EOL were discussed. We discuss chronic, progressive nature of dementia disease process. [name] asks about stages of dementia - we discuss that patient is in the final stage since she is no longer ambulating and having poor PO intake.   I attempted to elicit values and goals of care important to the patient.  [name] would like to have the patient at home with extra support.  We discussed code status at length. Encouraged [name] to consider DNR/DNI status understanding evidenced based poor outcomes in similar hospitalized patients, as the cause of the arrest is likely associated with chronic/terminal disease rather than a reversible acute cardio-pulmonary event. We discussed that full code interventions would not add to quality of life and would not reverse the dementia disease process. [name] expresses understanding to all of this but is unable to commit to changing code status. She requests time to think about this and agrees to talk more about it tomorrow.   [name] verbalizes that she understands patient's disease is severe and she understands that patient has currently improved with support of IV fluids but she understands once she returns home her PO intake will likely decline again.  Hospice services outpatient were explained and offered. [name] is agreeable to support of hospice at home. We discuss that hospice is a benefit for patients who are nearing  end of life and assists with focusing on comfort and quality of life. She expresses understanding.   Questions and concerns were addressed. The family was encouraged to call with questions or concerns.    Primary  Decision Maker NEXT OF KIN - granddaughter [name] Lobban  SUMMARY OF RECOMMENDATIONS   - family is interested in hospice support at home - family educated extensively on recommendation of code status change to DNR - still considering and agreeable to additional conversation 6/18 - family educated on dementia disease process  Code Status/Advance Care Planning: Full code  Prognosis:  Weeks - months depending on her PO intake at home  Discharge Planning: Home with Hospice      Primary Diagnoses: Present on Admission:  Acute kidney injury superimposed on chronic kidney disease ([place])  Dehydration  Pacemaker  Dementia ([place])  Diabetes mellitus with complication ([place])  Hyperkalemia  Macrocytic anemia  Hypercalcemia   I have reviewed the medical record, interviewed the patient and family, and examined the patient. The following aspects are pertinent.  Past Medical History:  Diagnosis Date   Asthma    CKD (chronic kidney disease) stage 3, GFR 30-59 ml/min (HCC) 05/01/2018   Colostomy present ([place])    hx/notes 07/17/2010   Dementia ([place])    Diabetic neuropathy ([place])    Hypertension    hx/notes 07/17/2010   Pneumonia    recent/notes 10/08/2016   Symptomatic bradycardia 12/15/2017   Type II diabetes mellitus ([place])    hx/notes 07/17/2010   Social History   Socioeconomic History   Marital status: Single    Spouse name: Not on file   Number of children: Not on file   Years of education: Not on file   Highest education level: Not on file  Occupational History   Not on file  Tobacco Use   Smoking status: Never   Smokeless tobacco: Former    Types: Chew    Quit date: 02/27/2017  Vaping Use   Vaping Use: Never used  Substance and Sexual Activity   Alcohol use: No   Drug use: No   Sexual activity: Never  Other Topics Concern   Not on file  Social History Narrative   Not on file   Social Determinants of Health   Financial Resource Strain: Not on file  Food Insecurity:  Not on file  Transportation Needs: Not on file  Physical Activity: Not on file  Stress: Not on file  Social Connections: Not on file   Family History  Problem Relation Age of Onset   Kidney disease Mother    Hypertension Mother    Other Father        gangrene with amputation   Diabetes Sister    Cancer Brother    Heart disease Brother    Schizophrenia Daughter    Diabetes Daughter    Stroke Daughter    Diabetes Sister    Breast cancer Neg Hx    Scheduled Meds:  busPIRone  5 mg Oral TID   donepezil  10 mg Oral QHS   escitalopram  10 mg Oral QPM   feeding supplement  237 mL Oral BID BM   heparin  5,000 Units Subcutaneous Q8H   memantine  28 mg Oral q morning   montelukast  10 mg Oral QHS   pravastatin  10 mg Oral QHS   sodium chloride flush  3 mL Intravenous Q12H   Continuous Infusions:  sodium chloride 100 mL/hr at 08/30/20 0956   PRN Meds:.acetaminophen **OR** acetaminophen,  albuterol, dextrose, ondansetron **OR** ondansetron (ZOFRAN) IV Allergies  Allergen Reactions   Aspirin Other (See Comments)    Triggers asthma   Review of Systems  Unable to perform ROS: Dementia   Physical Exam Constitutional:      General: She is not in acute distress. Pulmonary:     Effort: Pulmonary effort is normal.  Skin:    General: Skin is warm and dry.  Neurological:     Mental Status: She is alert. She is disoriented.    Vital Signs: BP (!) 117/58 (BP Location: Left Arm)   Pulse (!) 59   Temp 97.7 F (36.5 C) (Oral)   Resp 18   SpO2 100%  Pain Scale: 0-10   Pain Score: 0-No pain   SpO2: SpO2: 100 % O2 Device:SpO2: 100 % O2 Flow Rate: .   IO: Intake/output summary:  Intake/Output Summary (Last 24 hours) at 08/30/2020 1440 Last data filed at 08/30/2020 1000 Gross per 24 hour  Intake 1344.62 ml  Output 1350 ml  Net -5.38 ml    LBM:   Baseline Weight:   Most recent weight:       Palliative Assessment/Data: PPS 20%    Time Total: 75 minutes Greater than  50%  of this time was spent counseling and coordinating care related to the above assessment and plan.  Juel Burrow, DNP, AGNP-C Palliative Medicine Team [phone] Pager: [phone]

## 2020-08-31 DIAGNOSIS — Z66 Do not resuscitate: Secondary | ICD-10-CM

## 2020-08-31 LAB — CBC WITH DIFFERENTIAL/PLATELET
Abs Immature Granulocytes: 0.01 10*3/uL (ref 0.00–0.07)
Basophils Absolute: 0 10*3/uL (ref 0.0–0.1)
Basophils Relative: 0 %
Eosinophils Absolute: 0.2 10*3/uL (ref 0.0–0.5)
Eosinophils Relative: 4 %
HCT: 31.9 % — ABNORMAL LOW (ref 36.0–46.0)
Hemoglobin: 10.1 g/dL — ABNORMAL LOW (ref 12.0–15.0)
Immature Granulocytes: 0 %
Lymphocytes Relative: 35 %
Lymphs Abs: 1.6 10*3/uL (ref 0.7–4.0)
MCH: 30.7 pg (ref 26.0–34.0)
MCHC: 31.7 g/dL (ref 30.0–36.0)
MCV: 97 fL (ref 80.0–100.0)
Monocytes Absolute: 0.4 10*3/uL (ref 0.1–1.0)
Monocytes Relative: 8 %
Neutro Abs: 2.4 10*3/uL (ref 1.7–7.7)
Neutrophils Relative %: 53 %
Platelets: 100 10*3/uL — ABNORMAL LOW (ref 150–400)
RBC: 3.29 MIL/uL — ABNORMAL LOW (ref 3.87–5.11)
RDW: 12.7 % (ref 11.5–15.5)
WBC: 4.6 10*3/uL (ref 4.0–10.5)
nRBC: 0 % (ref 0.0–0.2)

## 2020-08-31 LAB — COMPREHENSIVE METABOLIC PANEL
ALT: 9 U/L (ref 0–44)
AST: 16 U/L (ref 15–41)
Albumin: 2.7 g/dL — ABNORMAL LOW (ref 3.5–5.0)
Alkaline Phosphatase: 55 U/L (ref 38–126)
Anion gap: 6 (ref 5–15)
BUN: 32 mg/dL — ABNORMAL HIGH (ref 8–23)
CO2: 20 mmol/L — ABNORMAL LOW (ref 22–32)
Calcium: 8.7 mg/dL — ABNORMAL LOW (ref 8.9–10.3)
Chloride: 110 mmol/L (ref 98–111)
Creatinine, Ser: 1.38 mg/dL — ABNORMAL HIGH (ref 0.44–1.00)
GFR, Estimated: 38 mL/min — ABNORMAL LOW (ref >60)
Glucose, Bld: 141 mg/dL — ABNORMAL HIGH (ref 70–99)
Potassium: 4.8 mmol/L (ref 3.5–5.1)
Sodium: 136 mmol/L (ref 135–145)
Total Bilirubin: 0.6 mg/dL (ref 0.3–1.2)
Total Protein: 5.4 g/dL — ABNORMAL LOW (ref 6.5–8.1)

## 2020-08-31 LAB — GLUCOSE, CAPILLARY
Glucose-Capillary: 148 mg/dL — ABNORMAL HIGH (ref 70–99)
Glucose-Capillary: 154 mg/dL — ABNORMAL HIGH (ref 70–99)
Glucose-Capillary: 132 mg/dL — ABNORMAL HIGH (ref 70–99)
Glucose-Capillary: 142 mg/dL — ABNORMAL HIGH (ref 70–99)

## 2020-08-31 NOTE — TOC Initial Note (Signed)
Transition of Care [facility]) - Initial/Assessment Note    Patient Details  Name: [name] MRN: [id number] Date of Birth: 1933-10-22  Transition [facility]) CM/SW Contact:    Bartholomew Crews, RN Phone Number: [phone] 08/31/2020, 1:29 PM  Clinical Narrative:                  Spoke with granddaughter, [name], on her home phone number. PTA patient living with [name], [name]'s boyfriend, and [name]'s 2 kids ages 6 and 7. No other caregiver assistance. Patient sleeps in her regular bed. Discussed referral for hospice services at home. Referral to [facility]. Patient will need ambulance transport home at time of discharge. Demographics verified. TOC following for transition needs.   Expected Discharge Plan: Home w Hospice Care Barriers to Discharge: Continued Medical Work up   Patient Goals and CMS Choice Patient states their goals for this hospitalization and ongoing recovery are:: home with granddaughter and hospice care CMS Medicare.gov Compare Post Acute Care list provided to:: Patient Represenative (must comment) Choice offered to / list presented to : Adult Children  Expected Discharge Plan and Services Expected Discharge Plan: Home w Hospice Care   Discharge Planning Services: CM Consult Post Acute Care Choice: Hospice Living arrangements for the past 2 months: Single Family Home                                      Prior Living Arrangements/Services Living arrangements for the past 2 months: Single Family Home Lives with:: Self, Adult Children                   Activities of Daily Living Home Assistive Devices/Equipment: Environmental consultant (specify type) ADL Screening (condition at time of admission) Patient's cognitive ability adequate to safely complete daily activities?: No Is the patient deaf or have difficulty hearing?: No Does the patient have difficulty seeing, even when wearing glasses/contacts?: No Does the patient have difficulty concentrating,  remembering, or making decisions?: Yes Patient able to express need for assistance with ADLs?: Yes Does the patient have difficulty dressing or bathing?: Yes Independently performs ADLs?: No Dressing (OT): Needs assistance Is this a change from baseline?: Pre-admission baseline Grooming: Needs assistance Is this a change from baseline?: Pre-admission baseline Feeding: Independent Bathing: Needs assistance Is this a change from baseline?: Pre-admission baseline Toileting: Needs assistance Is this a change from baseline?: Pre-admission baseline In/Out Bed: Independent Walks in Home: Independent Does the patient have difficulty walking or climbing stairs?: Yes Weakness of Legs: Both Weakness of Arms/Hands: Both  Permission Sought/Granted                  Emotional Assessment              Admission diagnosis:  AKI (acute kidney injury) ([place]) [N17.9] Acute kidney injury superimposed on chronic kidney disease ([place]) [N17.9, N18.9] Patient Active Problem List   Diagnosis Date Noted   Malnutrition of moderate degree 08/30/2020   Protein-calorie malnutrition, severe 08/30/2020   Acute kidney injury superimposed on chronic kidney disease ([place]) 08/29/2020   Dehydration 08/29/2020   Hyperkalemia 08/29/2020   Macrocytic anemia 08/29/2020   COVID-19 virus infection 04/07/2020   Pacemaker 12/21/2019   UTI (urinary tract infection) 05/11/2019   ARF (acute renal failure) ([place]) 05/10/2019   Acute lower UTI 05/10/2019   Symptomatic bradycardia 05/01/2018   Near syncope 05/01/2018   CKD (chronic kidney disease) stage 3,  GFR 30-59 ml/min 05/01/2018   Influenza B 04/10/2018   Generalized weakness 04/10/2018   Essential hypertension 03/01/2018   COPD (chronic obstructive pulmonary disease) ([place]) 03/01/2018   Bradycardia 12/15/2017   AKI (acute kidney injury) ([place]) 07/18/2017   Colostomy present ([place]) 11/08/2016   Syncope 10/08/2016   Acute kidney injury ([place]) 10/08/2016    Abnormal chest x-ray 10/08/2016   Lethargy 10/08/2016   Dementia ([place])    Sepsis ([place]) 09/05/2016   Community acquired pneumonia 09/05/2016   Acute encephalopathy 09/05/2016   Diabetes mellitus with complication ([place]) [id number]   Nausea & vomiting 04/09/2012   Gallstones 03/15/2011   Partial SBO 03/15/2011   Colostomy hernia ([place]) 03/15/2011   Hypercalcemia 02/28/2007   IRON DEFIC ANEMIA [place] DIET IRON INTAKE 02/28/2007   DISCITIS 02/28/2007   OSTEOMYELITIS 02/28/2007   CHILLS WITHOUT FEVER 02/28/2007   PCP:  Leeroy Cha, MD Pharmacy:   [facility] [place], [place] - [address] [place] [address] Phone: [phone] Fax: [phone]     Social Determinants of Health (SDOH) Interventions    Readmission Risk Interventions No flowsheet data found.

## 2020-08-31 NOTE — Progress Notes (Signed)
   Palliative Medicine Inpatient Follow Up Note   HPI: 87 y.o. female  with past medical history of dementia, chronic diastolic heart failure, symptomatic bradycardia s/p PPM, s/p colostomy, and CKD stage IIIb  admitted on 08/29/2020 with weakness. Reports of poor PO intake at home. Found to have creatinine of 3.08 (baseline 1.3-1.5). Admitted for IV fluids. PMT consulted to discuss [place].  Today's Discussion (08/31/2020):  *Please note that this is a verbal dictation therefore any spelling or grammatical errors are due to the "[place]" system interpretation.  Chart reviewed. Patients Cr appears to be down-trending.   I spoke to patients granddaughter, [name] this afternoon. We reviewed advanced dementia and indications of this in [place] as she is sleeping much of the day and has ongoing failure to thrive. She has been recently unable to hold her urine or bowels. We reviewed the FAST scale and s/s of worsening dementia to the point of needing hospice. I shared that I agree with my colleague Darol Destine that it does appear we are now at that point.   Had a comprehensive discussion about CPR and the potential harms with little benefit in [place] given her co-morbidities. [name] understood this and is in agreement with DNAR/DNI. She shares that she would not wish to cause her grandmother more trauma.   We discussed the plan for transition home on hospice. [name] is anxious to hear from the team to get a better understanding about in home hospice services.  Questions and concerns addressed   Objective Assessment: Vital Signs Vitals:   08/31/20 0520 08/31/20 1201  BP: 128/68 118/62  Pulse: 60 60  Resp: 18 17  Temp: (!) 97.5 F (36.4 C) 97.9 F (36.6 C)  SpO2: 100% 100%    Intake/Output Summary (Last 24 hours) at 08/31/2020 1227 Last data filed at 08/31/2020 1157 Gross per 24 hour  Intake 2645.96 ml  Output 2050 ml  Net 595.96 ml   Last Weight  Most recent update: 08/31/2020  6:22 AM     Weight  54.5 kg (120 lb 3.2 oz)            Physical Exam Constitutional:      General: She is not in acute distress. Pulmonary:    Effort: Pulmonary effort is normal. Skin:    General: Skin is warm and dry. Neurological:    Mental Status: She is alert. She is disoriented.   SUMMARY OF RECOMMENDATIONS   DNAR/DNI  Education on advanced dementia provided  Spiritual Support  Plan for home with hospice - Appreciate TOC team reaching out to granddaughter  Ongoing incremental PMT support  Time Spent: 35 Greater than 50% of the time was spent in counseling and coordination of care ______________________________________________________________________________________ [place] Team Team Cell Phone: [phone] Please utilize secure chat with additional questions, if there is no response within 30 minutes please call the above phone number  Palliative Medicine Team providers are available by phone from 7am to 7pm daily and can be reached through the team cell phone.  Should this patient require assistance outside of these hours, please call the patient's attending physician.

## 2020-08-31 NOTE — Progress Notes (Signed)
[facility]) Hospital Liaison RN note  Hospice Referral received for home with hospice. Upon review from hospice physician, patient not currently hospice eligible. Per granddaughter patient is still ambulatory at home, can assist with ADL's at home and is still able to eat, drink and feed self, though she has had periods where she does not eat. Hospice physician requested PT evaluation to see if patient could benefit from rehab, strengthening and continued medical treatment.  Spoke with Tacey Ruiz, NP with PMT and with granddaughter [name] and Dr. Marylyn Ishihara who are in agreement with this plan.   Outpatient palliative care referral received to follow at discharge.  [facility] will follow for discharge disposition.  Please call with any hospice or outpatient palliative care questions or concerns.  Thank you, Margaretmary Eddy, RN, BSN [facility] Liaison [phone]

## 2020-08-31 NOTE — Plan of Care (Signed)

## 2020-08-31 NOTE — Progress Notes (Signed)
PROGRESS NOTE    [name]  [id number] DOB: 07/21/1933 DOA: 08/29/2020 PCP: Leeroy Cha, MD   Brief Narrative:   Past medical history of dementia, chronic CHF, bradycardia SP PPM implant, colostomy CKD 3B.  Presents with complaints of generalized weakness.  Found to have AKI on CKD and poor p.o. intake.Plan is to discharge to home with hospice once medically stable.   Assessment & Plan: AKI on CKD IIIb with metabolic acidosis     - Patient reportedly has not been eating and recently stopped drinking fluids.     - Presents with creatinine elevated up to 3.08 with BUN 57 and CO2 18.     - Baseline creatinine previously noted to be 1.3-1.5.     - 6/18: she is back to baseline w/ an SCr of 1/38   Hyperkalemia Hypercalcemia     - K+ mildly elevated at 5.4 at admission     - Ca2+ mildly elevated at 10.5 at admission     - both now resolved   Dementia     - Patient is alert and oriented only to person and place but not time.     - Delirium precautions     - Continue donepezil, Namenda BuSpar,     - Palliative care consulted plan is to go home with hospice   Macrocytic anemia     - On admission hemoglobin 10.2 which appears around patient's baseline     - no evidence of bleed, follow   Diabetes mellitus type 2     - On admission glucose was noted to be 77.     - Last hemoglobin A1c on 04/07/2020 was 5.7.  Home medication regimen included Glucophage XR 500 mg     - Hypoglycemic protocols     - Hold metformin     - CBGs before every meal and at bedtime     - Amp of D50 as needed for low blood sugars     - 6/18: glucose looks good this morning, follow  History of symptomatic bradycardia s/p pacemaker     - Pacemaker placed in 2020; stable   S/p colostomy     - Resulted after small bowel obstruction in 2012     - Ostomy care as needed   COPD, without exacerbation     - CXR showed no acute abnormality.     - Albuterol inhaler as needed for shortness of  breath/wheezing     - 6/18: resp status stable  DVT prophylaxis: heparin Code Status: DNR Family Communication: attempted calls to both number listed in chart, unable to reach grddtr   Status is: Inpatient  Remains inpatient appropriate because:Unsafe d/c plan  Dispo: The patient is from: Home              Anticipated d/c is to: Home              Patient currently is medically stable to d/c.   Difficult to place patient No  Subjective: "I sure would like to go home."  Objective: Vitals:   08/30/20 1403 08/30/20 2029 08/31/20 0520 08/31/20 1201  BP: (!) 117/58 (!) 142/67 128/68 118/62  Pulse: (!) 59 (!) 59 60 60  Resp: '18 18 18 17  '$ Temp: 97.7 F (36.5 C) 99 F (37.2 C) (!) 97.5 F (36.4 C) 97.9 F (36.6 C)  TempSrc: Oral Oral Oral Oral  SpO2: 100% 100% 100% 100%  Weight:   54.5 kg     Intake/Output Summary (  Last 24 hours) at 08/31/2020 1448 Last data filed at 08/31/2020 1157 Gross per 24 hour  Intake 2405.96 ml  Output 2050 ml  Net 355.96 ml   Filed Weights   08/31/20 0520  Weight: 54.5 kg    Examination:  General: 87 y.o. female resting in bed in NAD Eyes: PERRL, normal sclera ENMT: Nares patent w/o discharge, orophaynx clear, dentition normal, ears w/o discharge/lesions/ulcers Neck: Supple, trachea midline Cardiovascular: RRR, +S1, S2, no m/g/r, equal pulses throughout Respiratory: CTABL, no w/r/r, normal WOB GI: BS+, NDNT, no masses noted, no organomegaly noted MSK: No e/c/c Skin: No rashes, bruises, ulcerations noted Neuro: Alert to name, follows commands Psyc: Pleasant, calm/cooperative   Data Reviewed: I have personally reviewed following labs and imaging studies.  CBC: Recent Labs  Lab 08/29/20 1406 08/30/20 0116 08/31/20 0128  WBC 5.4 5.7 4.6  NEUTROABS 2.9 2.9 2.4  HGB 10.2* 10.4* 10.1*  HCT 33.8* 33.0* 31.9*  MCV 100.3* 96.5 97.0  PLT 219 221 [id number]*   Basic Metabolic Panel: Recent Labs  Lab 08/29/20 1300 08/30/20 0116 08/31/20 0821   NA 140 137 136  K 5.4* 4.9 4.8  CL 110 110 110  CO2 18* 18* 20*  GLUCOSE 77 68* 141*  BUN 57* 46* 32*  CREATININE 3.08* 2.26* 1.38*  CALCIUM 10.5* 9.5 8.7*   GFR: Estimated Creatinine Clearance: 25.6 mL/min (A) (by C-G formula based on SCr of 1.38 mg/dL (H)). Liver Function Tests: Recent Labs  Lab 08/29/20 1300 08/31/20 0821  AST 15 16  ALT 6 9  ALKPHOS 66 55  BILITOT 0.5 0.6  PROT 6.8 5.4*  ALBUMIN 3.8 2.7*   No results for input(s): LIPASE, AMYLASE in the last 168 hours. No results for input(s): AMMONIA in the last 168 hours. Coagulation Profile: No results for input(s): INR, PROTIME in the last 168 hours. Cardiac Enzymes: No results for input(s): CKTOTAL, CKMB, CKMBINDEX, TROPONINI in the last 168 hours. BNP (last 3 results) No results for input(s): PROBNP in the last 8760 hours. HbA1C: No results for input(s): HGBA1C in the last 72 hours. CBG: Recent Labs  Lab 08/30/20 1142 08/30/20 1616 08/30/20 2156 08/31/20 0755 08/31/20 1158  GLUCAP 142* 165* 146* 148* 154*   Lipid Profile: No results for input(s): CHOL, HDL, LDLCALC, TRIG, CHOLHDL, LDLDIRECT in the last 72 hours. Thyroid Function Tests: No results for input(s): TSH, T4TOTAL, FREET4, T3FREE, THYROIDAB in the last 72 hours. Anemia Panel: Recent Labs    08/30/20 0116  VITAMINB12 206  FOLATE 10.5   Sepsis Labs: No results for input(s): PROCALCITON, LATICACIDVEN in the last 168 hours.  Recent Results (from the past 240 hour(s))  SARS CORONAVIRUS 2 (TAT 6-24 HRS) Nasopharyngeal Nasopharyngeal Swab     Status: None   Collection Time: 08/29/20  2:40 PM   Specimen: Nasopharyngeal Swab  Result Value Ref Range Status   SARS Coronavirus 2 NEGATIVE NEGATIVE Final    Comment: (NOTE) SARS-CoV-2 target nucleic acids are NOT DETECTED.  The SARS-CoV-2 RNA is generally detectable in upper and lower respiratory specimens during the acute phase of infection. Negative results do not preclude SARS-CoV-2  infection, do not rule out co-infections with other pathogens, and should not be used as the sole basis for treatment or other patient management decisions. Negative results must be combined with clinical observations, patient history, and epidemiological information. The expected result is Negative.  Fact Sheet for Patients: SugarRoll.be  Fact Sheet for Healthcare Providers: https://www.woods-mathews.com/  This test is not yet approved or cleared by  the [employer] and  has been authorized for detection and/or diagnosis of SARS-CoV-2 by FDA under an Emergency Use Authorization (EUA). This EUA will remain  in effect (meaning this test can be used) for the duration of the COVID-19 declaration under Se ction 564(b)(1) of the Act, 21 U.S.C. section 360bbb-3(b)(1), unless the authorization is terminated or revoked sooner.  Performed at [facility], [place] [address] [place], [place] [address]       Radiology Studies: US RENAL  Result Date: 08/29/2020 CLINICAL DATA:  87 year old female with acute renal insufficiency. EXAM: RENAL / URINARY TRACT ULTRASOUND COMPLETE COMPARISON:  Right upper quadrant ultrasound dated 03/15/2011. FINDINGS: Right Kidney: Renal measurements: 7.4 x 4.0 x 4.7 cm = volume: 73 mL. There is diffuse increased renal parenchymal echogenicity. Mild parenchyma atrophy. No hydronephrosis or shadowing stone. Left Kidney: Renal measurements: 7.3 x 4.4 x 3.4 cm = volume: 57 mL. Increased renal parenchymal echogenicity and mild parenchyma atrophy. No hydronephrosis or shadowing stone. Bladder: Appears normal for degree of bladder distention. Other: Gallstones noted. IMPRESSION: 1. Mildly echogenic and atrophic kidneys in keeping with chronic kidney disease. No hydronephrosis or shadowing stone. 2. Cholelithiasis. Electronically Signed   By: Anner Crete M.D.   On: 08/29/2020 16:56     Scheduled Meds:  busPIRone  5 mg  Oral TID   donepezil  10 mg Oral QHS   escitalopram  10 mg Oral QPM   feeding supplement  237 mL Oral TID BM   heparin  5,000 Units Subcutaneous Q8H   memantine  28 mg Oral q morning   montelukast  10 mg Oral QHS   multivitamin with minerals  1 tablet Oral Daily   pravastatin  10 mg Oral QHS   sodium chloride flush  3 mL Intravenous Q12H   Continuous Infusions:  sodium chloride 100 mL/hr at 08/31/20 0441     LOS: 2 days    Time spent: 25 minutes   Jonnie Finner, DO [facility]  If 7PM-7AM, please contact night-coverage [email or web address] 08/31/2020, 2:48 PM

## 2020-08-31 NOTE — Progress Notes (Addendum)
Returned call to grand daughter(Tamekia H.) x2 (10pm and 10:42pm)but no answer. Pt [place] daughter called back @ 2307 and updated her.

## 2020-09-01 LAB — GLUCOSE, CAPILLARY
Glucose-Capillary: 94 mg/dL (ref 70–99)
Glucose-Capillary: 132 mg/dL — ABNORMAL HIGH (ref 70–99)
Glucose-Capillary: 114 mg/dL — ABNORMAL HIGH (ref 70–99)
Glucose-Capillary: 177 mg/dL — ABNORMAL HIGH (ref 70–99)

## 2020-09-01 NOTE — Progress Notes (Signed)
[name]  [id number] DOB: Jan 22, 1934 DOA: 08/29/2020 PCP: Leeroy Cha, MD    Brief Narrative:  87 year old with a history of dementia, chronic CHF, bradycardia status post pacemaker, and CKD who presented to the ED with severe generalized weakness and was found to be in acute kidney injury.  Consultants:  Palliative care  Code Status: NO CODE BLUE  Antimicrobials:  None  DVT prophylaxis: Subcu heparin  Subjective: Afebrile.  Vital signs stable.  CBG well controlled.  Sitting up in a bedside chair.  Pleasant and conversant and denying any new complaints.  Is confused but alert.  Assessment & Plan:  Acute kidney injury on CKD stage IIIb Due to very limited intake - creatinine 3.08 at presentation with baseline 1.4 -improved rapidly with simple volume resuscitation  Recent Labs  Lab 08/29/20 1300 08/30/20 0116 08/31/20 0821  CREATININE 3.08* 2.26* 1.38*    Hyperkalemia Due to above - resolved with stabilization of kidney function  Hypercalcemia Due to dehydration - resolved with volume expansion  Dementia Palliative care meeting with patient and family  DM2 A1c 5.7 January of this year -no indication for strict control at present given clinical scenario  Symptomatic bradycardia status post pacemaker placement  SBO status post resection 2012 with chronic colostomy  COPD Presently stable  Disposition Patient is not felt to be a candidate for home hospice at present -PT/OT evaluations pending for possible SNF appropriateness   Family Communication:  Status is: Inpatient  Remains inpatient appropriate because:Unsafe d/c plan  Dispo: The patient is from: Home              Anticipated d/c is to: SNF              Patient currently is medically stable to d/c.   Difficult to place patient No   Objective: Blood pressure (!) 149/62, pulse 60, temperature 97.7 F (36.5 C), temperature source Oral, resp. rate 17, weight 53.9 kg, SpO2 100  %.  Intake/Output Summary (Last 24 hours) at 09/01/2020 1200 Last data filed at 09/01/2020 1058 Gross per 24 hour  Intake 1031.64 ml  Output 1825 ml  Net -793.36 ml   Filed Weights   08/31/20 0520 09/01/20 0440  Weight: 54.5 kg 53.9 kg    Examination: General: No acute respiratory distress Lungs: Clear to auscultation bilaterally without wheezes or crackles Cardiovascular: Regular rate and rhythm without murmur gallop or rub normal S1 and S2 Abdomen: Nontender, nondistended, soft, bowel sounds positive, no rebound, no ascites, no appreciable mass Extremities: No significant cyanosis, clubbing, or edema bilateral lower extremities  CBC: Recent Labs  Lab 08/29/20 1406 08/30/20 0116 08/31/20 0128  WBC 5.4 5.7 4.6  NEUTROABS 2.9 2.9 2.4  HGB 10.2* 10.4* 10.1*  HCT 33.8* 33.0* 31.9*  MCV 100.3* 96.5 97.0  PLT 219 221 [id number]*   Basic Metabolic Panel: Recent Labs  Lab 08/29/20 1300 08/30/20 0116 08/31/20 0821  NA 140 137 136  K 5.4* 4.9 4.8  CL 110 110 110  CO2 18* 18* 20*  GLUCOSE 77 68* 141*  BUN 57* 46* 32*  CREATININE 3.08* 2.26* 1.38*  CALCIUM 10.5* 9.5 8.7*   GFR: Estimated Creatinine Clearance: 25.4 mL/min (A) (by C-G formula based on SCr of 1.38 mg/dL (H)).  Liver Function Tests: Recent Labs  Lab 08/29/20 1300 08/31/20 0821  AST 15 16  ALT 6 9  ALKPHOS 66 55  BILITOT 0.5 0.6  PROT 6.8 5.4*  ALBUMIN 3.8 2.7*    HbA1C: Hgb A1c MFr  Bld  Date/Time Value Ref Range Status  04/07/2020 02:28 AM 5.7 (H) 4.8 - 5.6 % Final    Comment:    (NOTE) Pre diabetes:          5.7%-6.4%  Diabetes:              >6.4%  Glycemic control for   <7.0% adults with diabetes   05/03/2018 05:10 AM 7.9 (H) 4.8 - 5.6 % Final    Comment:    (NOTE)         Prediabetes: 5.7 - 6.4         Diabetes: >6.4         Glycemic control for adults with diabetes: <7.0     CBG: Recent Labs  Lab 08/31/20 1158 08/31/20 1723 08/31/20 2046 09/01/20 0745 09/01/20 1147  GLUCAP  154* 132* 142* 94 132*    Recent Results (from the past 240 hour(s))  SARS CORONAVIRUS 2 (TAT 6-24 HRS) Nasopharyngeal Nasopharyngeal Swab     Status: None   Collection Time: 08/29/20  2:40 PM   Specimen: Nasopharyngeal Swab  Result Value Ref Range Status   SARS Coronavirus 2 NEGATIVE NEGATIVE Final    Comment: (NOTE) SARS-CoV-2 target nucleic acids are NOT DETECTED.  The SARS-CoV-2 RNA is generally detectable in upper and lower respiratory specimens during the acute phase of infection. Negative results do not preclude SARS-CoV-2 infection, do not rule out co-infections with other pathogens, and should not be used as the sole basis for treatment or other patient management decisions. Negative results must be combined with clinical observations, patient history, and epidemiological information. The expected result is Negative.  Fact Sheet for Patients: SugarRoll.be  Fact Sheet for Healthcare Providers: https://www.woods-mathews.com/  This test is not yet approved or cleared by the [place] FDA and  has been authorized for detection and/or diagnosis of SARS-CoV-2 by FDA under an Emergency Use Authorization (EUA). This EUA will remain  in effect (meaning this test can be used) for the duration of the COVID-19 declaration under Se ction 564(b)(1) of the Act, 21 U.S.C. section 360bbb-3(b)(1), unless the authorization is terminated or revoked sooner.  Performed at [facility], [place] [address], [place], [place] [address]      Scheduled Meds:  busPIRone  5 mg Oral TID   donepezil  10 mg Oral QHS   escitalopram  10 mg Oral QPM   feeding supplement  237 mL Oral TID BM   heparin  5,000 Units Subcutaneous Q8H   memantine  28 mg Oral q morning   montelukast  10 mg Oral QHS   multivitamin with minerals  1 tablet Oral Daily   pravastatin  10 mg Oral QHS   sodium chloride flush  3 mL Intravenous Q12H   Continuous Infusions:   sodium chloride 100 mL/hr at 09/01/20 1159     LOS: 3 days   Cherene Altes, MD [facility] Office  [phone] Pager - Text Page per Shea Evans  If 7PM-7AM, please contact night-coverage per Amion 09/01/2020, 12:00 PM

## 2020-09-01 NOTE — Evaluation (Signed)
Physical Therapy Evaluation Patient Details Name: RIATA SAYARATH MRN: [id number] DOB: Dec 31, 1933 Today's Date: 09/01/2020   History of Present Illness  87 y.o. female  admitted on 08/29/2020 with weakness. Reports of poor PO intake at home. Found to have creatinine of 3.08 (baseline 1.3-1.5); Palliative Care consulted, and plan is to go home with Hospice Services; with past medical history of dementia, chronic diastolic heart failure, symptomatic bradycardia s/p PPM, s/p colostomy, and CKD stage IIIb.  Clinical Impression  Pt admitted with above diagnosis. Comes from home where she lives with family; Tells me that she typically walks household distances at baseline; Presents to PT with generalized weakness, decr activity tolerance; Walked 20 ft to bathroom with min assist and RW;  Pt currently with functional limitations due to the deficits listed below (see PT Problem List). Pt will benefit from skilled PT to increase their independence and safety with mobility to allow discharge to the venue listed below.       Follow Up Recommendations Home health PT;Supervision/Assistance - 24 hour;Other (comment) (If slow progress, consider SNF)    Equipment Recommendations  3in1 (PT)    Recommendations for Other Services       Precautions / Restrictions Precautions Precautions: Fall Restrictions Weight Bearing Restrictions: No      Mobility  Bed Mobility               General bed mobility comments: Recieved in recliner on entry    Transfers Overall transfer level: Needs assistance Equipment used: Rolling walker (2 wheeled) Transfers: Sit to/from Stand Sit to Stand: Min assist         General transfer comment: Cues for hand placement and safety; min assist to steady  Ambulation/Gait Ambulation/Gait assistance: Min guard Gait Distance (Feet): 20 Feet Assistive device: Rolling walker (2 wheeled) Gait Pattern/deviations: Step-through pattern;Decreased step length -  right;Decreased step length - left;Trunk flexed     General Gait Details: Cues to self-monitor for activity tolerance; good use of RW for balance  Stairs            Wheelchair Mobility    Modified Rankin (Stroke Patients Only)       Balance Overall balance assessment: Mild deficits observed, not formally tested                                           Pertinent Vitals/Pain Pain Assessment: No/denies pain    Home Living Family/patient expects to be discharged to:: Private residence Living Arrangements: Children Available Help at Discharge: Family;Available 24 hours/day Type of Home: House Home Access: Stairs to enter Entrance Stairs-Rails: Psychiatric nurse of Steps: 3 Home Layout: One level Home Equipment: Walker - 2 wheels;Shower seat      Prior Function Level of Independence: Needs assistance   Gait / Transfers Assistance Needed: Pt uses a RW for mobility  ADL's / Homemaking Assistance Needed: Pt's family assists with ADL's  Comments: Pt with dementia and no family present. She reports uses a RW.  Per prior visit, pt's Granddaughter assisted with ADLs.     Hand Dominance   Dominant Hand: Right    Extremity/Trunk Assessment   Upper Extremity Assessment Upper Extremity Assessment: Defer to OT evaluation    Lower Extremity Assessment Lower Extremity Assessment: Generalized weakness    Cervical / Trunk Assessment Cervical / Trunk Assessment: Normal  Communication   Communication: No difficulties  Cognition  Arousal/Alertness: Awake/alert Behavior During Therapy: WFL for tasks assessed/performed Overall Cognitive Status: History of cognitive impairments - at baseline                                        General Comments General comments (skin integrity, edema, etc.): no distress on room air    Exercises Other Exercises Other Exercises: Seated Marching, BLE, 10 reps Other Exercises: Seated  Kick outs, BLE, 10 reps Other Exercises: Seated ankle pumps, BLE 10 reps   Assessment/Plan    PT Assessment Patient needs continued PT services  PT Problem List Decreased strength;Decreased activity tolerance;Decreased balance;Decreased mobility;Decreased coordination;Decreased cognition;Decreased knowledge of use of DME;Decreased safety awareness       PT Treatment Interventions DME instruction;Gait training;Stair training;Functional mobility training;Therapeutic activities;Therapeutic exercise;Balance training;Cognitive remediation;Patient/family education    PT Goals (Current goals can be found in the Care Plan section)  Acute Rehab PT Goals Patient Stated Goal: To go home PT Goal Formulation: With patient Time For Goal Achievement: 09/15/20 Potential to Achieve Goals: Good    Frequency Min 3X/week   Barriers to discharge        Co-evaluation               AM-PAC PT "6 Clicks" Mobility  Outcome Measure Help needed turning from your back to your side while in a flat bed without using bedrails?: A Little Help needed moving from lying on your back to sitting on the side of a flat bed without using bedrails?: A Little Help needed moving to and from a bed to a chair (including a wheelchair)?: A Little Help needed standing up from a chair using your arms (e.g., wheelchair or bedside chair)?: A Little Help needed to walk in hospital room?: A Little Help needed climbing 3-5 steps with a railing? : A Lot 6 Click Score: 17    End of Session Equipment Utilized During Treatment: Gait belt Activity Tolerance: Patient tolerated treatment well Patient left: Other (comment) (in bathroom on commode with nursing student) Nurse Communication: Mobility status PT Visit Diagnosis: Unsteadiness on feet (R26.81);Muscle weakness (generalized) (M62.81)    Time: [id number] PT Time Calculation (min) (ACUTE ONLY): 13 min   Charges:   PT Evaluation $PT Eval Moderate Complexity: 1 Mod           Roney [place], [place]  Acute Rehabilitation Services Pager [phone] Office [phone]   [place] 09/01/2020, 8:25 PM

## 2020-09-01 NOTE — Evaluation (Signed)
Occupational Therapy Evaluation Patient Details Name: [name] MRN: [id number] DOB: 1933/09/10 Today's Date: 09/01/2020    History of Present Illness 87 y.o. female  admitted on 08/29/2020 with weakness. Reports of poor PO intake at home. Found to have creatinine of 3.08 (baseline 1.3-1.5); Palliative Care consulted, and plan is to go home with Hospice Services; with past medical history of dementia, chronic diastolic heart failure, symptomatic bradycardia s/p PPM, s/p colostomy, and CKD stage IIIb.   Clinical Impression   Pt admitted to ED for concerns listed above. PTA pt reported that she was independent using DME, with family assistance as needed. Pt requiring min guard to min A for all ADL's and functional mobility at this time. Pt will benefit from skilled therapy to assist with returning to baseline prior to returning home. Acute OT will continue to follow.     Follow Up Recommendations  SNF;Supervision/Assistance - 24 hour    Equipment Recommendations  None recommended by OT    Recommendations for Other Services       Precautions / Restrictions Precautions Precautions: Fall Restrictions Weight Bearing Restrictions: No      Mobility Bed Mobility               General bed mobility comments: Recieved in recliner on entry    Transfers Overall transfer level: Needs assistance Equipment used: Rolling walker (2 wheeled) Transfers: Sit to/from Stand Sit to Stand: Min assist         General transfer comment: Min A to power up and take a few steps.    Balance Overall balance assessment: Mild deficits observed, not formally tested                                         ADL either performed or assessed with clinical judgement   ADL Overall ADL's : Needs assistance/impaired Eating/Feeding: Set up;Sitting   Grooming: Supervision/safety;Sitting   Upper Body Bathing: Min guard;Sitting   Lower Body Bathing: Minimal  assistance;Sitting/lateral leans;Sit to/from stand   Upper Body Dressing : Min guard;Sitting   Lower Body Dressing: Minimal assistance;Sitting/lateral leans;Sit to/from stand   Toilet Transfer: Minimal assistance;Stand-pivot   Toileting- Clothing Manipulation and Hygiene: Minimal assistance;Sitting/lateral lean;Sit to/from stand   Tub/ Banker: Minimal assistance;Stand-pivot   Functional mobility during ADLs: Minimal assistance;Rolling walker;Cueing for sequencing;Cueing for safety General ADL Comments: Pt pressents with increased weakness, requiring min A to pwer up to standing and make short distance ambulation with RW. Pt additionally needs min guard for UB ADL's for safety and Min A for LB ADL's for sit<>stands to complete tasks.     Vision Baseline Vision/History: Cataracts Patient Visual Report: No change from baseline Vision Assessment?: No apparent visual deficits     Perception Perception Perception Tested?: No   Praxis Praxis Praxis tested?: Not tested    Pertinent Vitals/Pain Pain Assessment: No/denies pain     Hand Dominance Right   Extremity/Trunk Assessment Upper Extremity Assessment Upper Extremity Assessment: Overall WFL for tasks assessed   Lower Extremity Assessment Lower Extremity Assessment: Defer to PT evaluation   Cervical / Trunk Assessment Cervical / Trunk Assessment: Normal   Communication Communication Communication: No difficulties   Cognition Arousal/Alertness: Awake/alert Behavior During Therapy: WFL for tasks assessed/performed Overall Cognitive Status: History of cognitive impairments - at baseline  General Comments  VSS on RA    Exercises Exercises: Other exercises Other Exercises Other Exercises: Seated Marching, BLE, 10 reps Other Exercises: Seated Kick outs, BLE, 10 reps Other Exercises: Seated ankle pumps, BLE 10 reps   Shoulder Instructions      Home Living  Family/patient expects to be discharged to:: Private residence Living Arrangements: Children Available Help at Discharge: Family;Available 24 hours/day Type of Home: House       Home Layout: One level     Bathroom Shower/Tub: Teacher, early years/pre: Standard     Home Equipment: Environmental consultant - 2 wheels;Shower seat          Prior Functioning/Environment Level of Independence: Needs assistance  Gait / Transfers Assistance Needed: Pt uses a RW for mobility ADL's / Homemaking Assistance Needed: PT's family assists with ADL's   Comments: Pt with dementia and no family present. She reports uses a RW.  Per prior visit, pt's Granddaughter assisted with ADLs.        OT Problem List: Decreased strength;Decreased activity tolerance;Impaired balance (sitting and/or standing);Decreased coordination;Decreased cognition;Decreased safety awareness;Decreased knowledge of use of DME or AE      OT Treatment/Interventions: Self-care/ADL training;Therapeutic exercise;Energy conservation;DME and/or AE instruction;Therapeutic activities;Patient/family education;Balance training    OT Goals(Current goals can be found in the care plan section) Acute Rehab OT Goals Patient Stated Goal: To go home OT Goal Formulation: With patient Time For Goal Achievement: 09/15/20 Potential to Achieve Goals: Good ADL Goals Pt Will Perform Grooming: with modified independence;standing Pt Will Perform Upper Body Dressing: with modified independence;sitting Pt Will Perform Lower Body Dressing: with modified independence;with adaptive equipment;sitting/lateral leans;sit to/from stand Pt Will Transfer to Toilet: with modified independence;ambulating  OT Frequency: Min 2X/week   Barriers to D/C:            Co-evaluation              AM-PAC OT "6 Clicks" Daily Activity     Outcome Measure Help from another person eating meals?: A Little Help from another person taking care of personal grooming?: A  Little Help from another person toileting, which includes using toliet, bedpan, or urinal?: A Little Help from another person bathing (including washing, rinsing, drying)?: A Little Help from another person to put on and taking off regular upper body clothing?: A Little Help from another person to put on and taking off regular lower body clothing?: A Little 6 Click Score: 18   End of Session Equipment Utilized During Treatment: Rolling walker Nurse Communication: Mobility status  Activity Tolerance: Patient tolerated treatment well Patient left: in chair;with call bell/phone within reach;with chair alarm set  OT Visit Diagnosis: Unsteadiness on feet (R26.81);Other abnormalities of gait and mobility (R26.89);Muscle weakness (generalized) (M62.81)                Time: [id number] OT Time Calculation (min): 17 min Charges:  OT General Charges $OT Visit: 1 Visit OT Evaluation $OT Eval Moderate Complexity: 1 Mod  Nithila Sumners H., OTR/L Acute Rehabilitation  Teira Arcilla Elane Yolanda Bonine 09/01/2020, 5:24 PM

## 2020-09-01 NOTE — Plan of Care (Signed)
  Problem: Education: Goal: Knowledge of General Education information will improve Description: Including pain rating scale, medication(s)/side effects and non-pharmacologic comfort measures Outcome: Progressing   Problem: Health Behavior/Discharge Planning: Goal: Ability to manage health-related needs will improve Outcome: Progressing   Problem: Clinical Measurements: Goal: Ability to maintain clinical measurements within normal limits will improve Outcome: Progressing Goal: Will remain free from infection Outcome: Progressing   Problem: Nutrition: Goal: Adequate nutrition will be maintained Outcome: Progressing   Problem: Pain Managment: Goal: General experience of comfort will improve Outcome: Progressing   Problem: Safety: Goal: Ability to remain free from injury will improve Outcome: Progressing   Problem: Skin Integrity: Goal: Risk for impaired skin integrity will decrease Outcome: Progressing   

## 2020-09-01 NOTE — Progress Notes (Signed)
   Palliative Medicine Inpatient Follow Up Note  HPI:  87 y.o. female  with past medical history of dementia, chronic diastolic heart failure, symptomatic bradycardia s/p PPM, s/p colostomy, and CKD stage IIIb  admitted on 08/29/2020 with weakness. Reports of poor PO intake at home. Found to have creatinine of 3.08 (baseline 1.3-1.5). Admitted for IV fluids. PMT consulted to discuss [place].  Today's Discussion (09/01/2020):  *Please note that this is a verbal dictation therefore any spelling or grammatical errors are due to the "[place]" system interpretation.  Chart reviewed.   Per conversation with nursing technician Cliffie had eaten about 25% of her meals yesterday and was able to get out of bed.   Per morning assessment Breyana was awake and alert, she was able to get out of bed with me. She was not aware of where she was and thought we were at her home. She shares with me that she has been having some tooth pain on her upper and lower teeth. She was able to stand with me to get OOB - we were able to set up her breakfast for consumption.   Per [facility] hospice at this [place] does not meet criteria of services through their hospice but would qualify for palliative support.  Plan for patient to have PT/OT evaluations and transition to SNF possibly.  Questions and concerns addressed   Objective Assessment: Vital Signs Vitals:   09/01/20 0440 09/01/20 0919  BP: 124/65 (!) 149/62  Pulse: 61 60  Resp: 17 17  Temp: 98.4 F (36.9 C) 97.7 F (36.5 C)  SpO2: 100% 100%    Intake/Output Summary (Last 24 hours) at 09/01/2020 1032 Last data filed at 09/01/2020 0435 Gross per 24 hour  Intake 791.64 ml  Output 2275 ml  Net -1483.36 ml    Last Weight  Most recent update: 09/01/2020  5:22 AM    Weight  53.9 kg (118 lb 13.3 oz)            Physical Exam Constitutional:      General: She is not in acute distress. Pulmonary:    Effort: Pulmonary effort is normal. Skin:     General: Skin is warm and dry. Neurological:    Mental Status: She is alert. She is disoriented.   SUMMARY OF RECOMMENDATIONS   DNAR/DNI  Education on advanced dementia provided  Spiritual Support  PT/OT evaluations  Plan for OP Palliative Care through [facility]  Ongoing incremental PMT support  Time Spent: 35 Greater than 50% of the time was spent in counseling and coordination of care ______________________________________________________________________________________ [place] Team Team Cell Phone: [phone] Please utilize secure chat with additional questions, if there is no response within 30 minutes please call the above phone number  Palliative Medicine Team providers are available by phone from 7am to 7pm daily and can be reached through the team cell phone.  Should this patient require assistance outside of these hours, please call the patient's attending physician.

## 2020-09-01 NOTE — Progress Notes (Signed)
Physical Therapy Note  PT eval complete with full note to follow;  Noted OT rec for SNF for transition out of hospital;  She was able to walk to the bathroom with one person assist and RW;  If she has reliable assist, it is worth considering going home to familiar environment, caregivers and routines;  Will consider Home with [facility] services versus SNF for rehab (if slow progress);  Will follow acutely,   Roney Marion, [place] Pager [phone] Office [phone]

## 2020-09-02 LAB — GLUCOSE, CAPILLARY
Glucose-Capillary: 94 mg/dL (ref 70–99)
Glucose-Capillary: 165 mg/dL — ABNORMAL HIGH (ref 70–99)
Glucose-Capillary: 191 mg/dL — ABNORMAL HIGH (ref 70–99)

## 2020-09-02 NOTE — Progress Notes (Signed)
   Palliative Medicine Inpatient Follow Up Note  HPI:  87 y.o. female  with past medical history of dementia, chronic diastolic heart failure, symptomatic bradycardia s/p PPM, s/p colostomy, and CKD stage IIIb  admitted on 08/29/2020 with weakness. Reports of poor PO intake at home. Found to have creatinine of 3.08 (baseline 1.3-1.5). Admitted for IV fluids. PMT consulted to discuss [place].  Today's Discussion (09/02/2020):  *Please note that this is a verbal dictation therefore any spelling or grammatical errors are due to the "[place] One" system interpretation.  Chart reviewed. Has been eating 25-100% of meals.  Per nursing had an e/o confusion overnight whereby she was trying to get OOB.   Patient appears stable this morning.   Plan for transition to [facility] when medically optimized.  Questions and concerns addressed   Objective Assessment: Vital Signs Vitals:   09/02/20 0444 09/02/20 1330  BP: 123/64 (!) 118/57  Pulse: (!) 59 62  Resp: 20 17  Temp: 98.2 F (36.8 C) 98.6 F (37 C)  SpO2: 99% 99%    Intake/Output Summary (Last 24 hours) at 09/02/2020 1423 Last data filed at 09/02/2020 1327 Gross per 24 hour  Intake 120 ml  Output 1460 ml  Net -1340 ml    Last Weight  Most recent update: 09/01/2020  5:22 AM    Weight  53.9 kg (118 lb 13.3 oz)            Physical Exam Constitutional:      General: She is not in acute distress. Pulmonary:    Effort: Pulmonary effort is normal. Skin:    General: Skin is warm and dry. Neurological:    Mental Status: She is alert. She is disoriented.   SUMMARY OF RECOMMENDATIONS   DNAR/DNI  Education on advanced dementia provided  Spiritual Support  Plan for placement and OP Palliative Care through [facility]  Ongoing incremental PMT support  Time Spent: 15 Greater than 50% of the time was spent in counseling and coordination of  care ______________________________________________________________________________________ [place] Team Team Cell Phone: [phone] Please utilize secure chat with additional questions, if there is no response within 30 minutes please call the above phone number  Palliative Medicine Team providers are available by phone from 7am to 7pm daily and can be reached through the team cell phone.  Should this patient require assistance outside of these hours, please call the patient's attending physician.

## 2020-09-02 NOTE — Progress Notes (Signed)
Spoke with patients granddaughter. States she will try to find a ride to visit in the am. Would like speak with case manager and MD. If she cannot make it she would like to be contacted in regard to discharge plan for SNF placement.

## 2020-09-02 NOTE — Progress Notes (Signed)
Dear PROGRESS NOTE    [name]  [id number] DOB: 1933/03/23 DOA: 08/29/2020 PCP: Leeroy Cha, MD   Brief Narrative: This 87 years old female with history of dementia, chronic CHF, bradycardia s/p pacemaker and CKD presented to the ED with generalized weakness and was found to have acute kidney injury.  Palliative care consulted.  PT recommended skilled nursing facility.   Assessment & Plan:   Principal Problem:   Acute kidney injury superimposed on chronic kidney disease ([place]) Active Problems:   Hypercalcemia   Diabetes mellitus with complication (HCC)   Dementia (HCC)   Colostomy present ([place])   Pacemaker   Dehydration   Hyperkalemia   Macrocytic anemia   Malnutrition of moderate degree   Protein-calorie malnutrition, severe    Acute kidney injury on CKD stage IIIb: She presented with serum creatinine 3.08 at presentation with a baseline serum creatinine 1.4. This could be due to decreased p.o. intake. Serum creatinine improved rapidly with volume resuscitation.   Avoid nephrotoxic medications.  Serum creatinine 1.38 today  Hyperkalemia: Resolved  Hypercalcemia: Could be due to dehydration resolved with volume expansion.  Dementia: No behavioral problems. Continue supportive care.    Type 2 diabetes :  Monitor off medications.  Well-controlled   Symptomatic bradycardia status post pacemaker placement. Stable.   SBO status post resection 2012 with chronic colostomy: Stable.   COPD Stable. continue home inhalers.      DVT prophylaxis: Subcu heparin Code Status: DNR Family Communication: No family at bedside Disposition Plan:   Status is: Inpatient  Remains inpatient appropriate because:Inpatient level of care appropriate due to severity of illness  Dispo: The patient is from: Home              Anticipated d/c is to: SNF              Patient currently is not medically stable to d/c.   Difficult to place patient No     Consultants:  Palliative care  Procedures:  None Antimicrobials:   Anti-infectives (From admission, onward)    Start     Dose/Rate Route Frequency Ordered Stop   08/29/20 1530  cefTRIAXone (ROCEPHIN) 1 g in sodium chloride 0.9 % 100 mL IVPB  Status:  Discontinued        1 g 200 mL/hr over 30 Minutes Intravenous  Once 08/29/20 1523 08/29/20 1524        Subjective: Patient was seen and examined at bedside.  Overnight events noted.   Patient reports feeling much improved.  She denies any symptoms.  Objective: Vitals:   09/01/20 1657 09/01/20 2036 09/02/20 0444 09/02/20 1330  BP: 137/63 (!) 169/77 123/64 (!) 118/57  Pulse: 60 60 (!) 59 62  Resp: '16 17 20 17  '$ Temp: 98.1 F (36.7 C) 98.6 F (37 C) 98.2 F (36.8 C) 98.6 F (37 C)  TempSrc: Oral Oral Oral Oral  SpO2: 100% 100% 99% 99%  Weight:        Intake/Output Summary (Last 24 hours) at 09/02/2020 1412 Last data filed at 09/02/2020 1327 Gross per 24 hour  Intake 120 ml  Output 1460 ml  Net -1340 ml   Filed Weights   08/31/20 0520 09/01/20 0440  Weight: 54.5 kg 53.9 kg    Examination:  General exam: Appears calm and comfortable, not in any acute distress. Respiratory system: Clear to auscultation. Respiratory effort normal. Cardiovascular system: S1 & S2 heard, RRR. No JVD, murmurs, rubs, gallops or clicks. No pedal edema. Gastrointestinal system: Abdomen  is nondistended, soft and nontender. No organomegaly or masses felt. Normal bowel sounds heard. Central nervous system: Alert and oriented. No focal neurological deficits. Extremities: Symmetric 5 x 5 power.  No edema, no cyanosis, no clubbing. Skin: No rashes, lesions or ulcers Psychiatry: Judgement and insight appear normal. Mood & affect appropriate.     Data Reviewed: I have personally reviewed following labs and imaging studies  CBC: Recent Labs  Lab 08/29/20 1406 08/30/20 0116 08/31/20 0128  WBC 5.4 5.7 4.6  NEUTROABS 2.9 2.9 2.4  HGB 10.2*  10.4* 10.1*  HCT 33.8* 33.0* 31.9*  MCV 100.3* 96.5 97.0  PLT 219 221 [id number]*   Basic Metabolic Panel: Recent Labs  Lab 08/29/20 1300 08/30/20 0116 08/31/20 0821  NA 140 137 136  K 5.4* 4.9 4.8  CL 110 110 110  CO2 18* 18* 20*  GLUCOSE 77 68* 141*  BUN 57* 46* 32*  CREATININE 3.08* 2.26* 1.38*  CALCIUM 10.5* 9.5 8.7*   GFR: Estimated Creatinine Clearance: 25.4 mL/min (A) (by C-G formula based on SCr of 1.38 mg/dL (H)). Liver Function Tests: Recent Labs  Lab 08/29/20 1300 08/31/20 0821  AST 15 16  ALT 6 9  ALKPHOS 66 55  BILITOT 0.5 0.6  PROT 6.8 5.4*  ALBUMIN 3.8 2.7*   No results for input(s): LIPASE, AMYLASE in the last 168 hours. No results for input(s): AMMONIA in the last 168 hours. Coagulation Profile: No results for input(s): INR, PROTIME in the last 168 hours. Cardiac Enzymes: No results for input(s): CKTOTAL, CKMB, CKMBINDEX, TROPONINI in the last 168 hours. BNP (last 3 results) No results for input(s): PROBNP in the last 8760 hours. HbA1C: No results for input(s): HGBA1C in the last 72 hours. CBG: Recent Labs  Lab 09/01/20 1147 09/01/20 1659 09/01/20 2213 09/02/20 0712 09/02/20 1101  GLUCAP 132* 114* 177* 94 165*   Lipid Profile: No results for input(s): CHOL, HDL, LDLCALC, TRIG, CHOLHDL, LDLDIRECT in the last 72 hours. Thyroid Function Tests: No results for input(s): TSH, T4TOTAL, FREET4, T3FREE, THYROIDAB in the last 72 hours. Anemia Panel: No results for input(s): VITAMINB12, FOLATE, FERRITIN, TIBC, IRON, RETICCTPCT in the last 72 hours. Sepsis Labs: No results for input(s): PROCALCITON, LATICACIDVEN in the last 168 hours.  Recent Results (from the past 240 hour(s))  SARS CORONAVIRUS 2 (TAT 6-24 HRS) Nasopharyngeal Nasopharyngeal Swab     Status: None   Collection Time: 08/29/20  2:40 PM   Specimen: Nasopharyngeal Swab  Result Value Ref Range Status   SARS Coronavirus 2 NEGATIVE NEGATIVE Final    Comment: (NOTE) SARS-CoV-2 target nucleic  acids are NOT DETECTED.  The SARS-CoV-2 RNA is generally detectable in upper and lower respiratory specimens during the acute phase of infection. Negative results do not preclude SARS-CoV-2 infection, do not rule out co-infections with other pathogens, and should not be used as the sole basis for treatment or other patient management decisions. Negative results must be combined with clinical observations, patient history, and epidemiological information. The expected result is Negative.  Fact Sheet for Patients: SugarRoll.be  Fact Sheet for Healthcare Providers: https://www.woods-mathews.com/  This test is not yet approved or cleared by the [place] FDA and  has been authorized for detection and/or diagnosis of SARS-CoV-2 by FDA under an Emergency Use Authorization (EUA). This EUA will remain  in effect (meaning this test can be used) for the duration of the COVID-19 declaration under Se ction 564(b)(1) of the Act, 21 U.S.C. section 360bbb-3(b)(1), unless the authorization is terminated or revoked  sooner.  Performed at [facility], [place] [address] [place], [place] [address]     Radiology Studies: No results found.  Scheduled Meds:  busPIRone  5 mg Oral TID   donepezil  10 mg Oral QHS   escitalopram  10 mg Oral QPM   feeding supplement  237 mL Oral TID BM   heparin  5,000 Units Subcutaneous Q8H   memantine  28 mg Oral q morning   montelukast  10 mg Oral QHS   multivitamin with minerals  1 tablet Oral Daily   pravastatin  10 mg Oral QHS   sodium chloride flush  3 mL Intravenous Q12H   Continuous Infusions:   LOS: 4 days    Time spent: 35 mins    Jessic Standifer, MD [facility]   If 7PM-7AM, please contact night-coverage

## 2020-09-02 NOTE — TOC Progression Note (Signed)
Transition of Care [facility]) - Progression Note    Patient Details  Name: [name] MRN: [id number] Date of Birth: 06/10/1933  Transition of Care [facility]) CM/SW [place], [place] Phone Number: 09/02/2020, 12:00 PM  Clinical Narrative:     CSW is informed of message to [facility] office form pt grandaughter requesting SNF placement at this time. CSW called pt grandaughter and confirmed plan for SNF. She states pt was at [facility] for about 3 week previously this year. She states that pt has had 1 covid vaccine. TOC will follow up with offers. CSW to complete FL2 and fax bed requests in hub.   Expected Discharge Plan: [place] Barriers to Discharge: Continued Medical Work up  Expected Discharge Plan and Services Expected Discharge Plan: [place]   Discharge Planning Services: CM Consult Post Acute Care Choice: [place] Living arrangements for the past 2 months: Single Family Home                                       Social Determinants of Health (SDOH) Interventions    Readmission Risk Interventions No flowsheet data found.

## 2020-09-02 NOTE — Care Management Important Message (Signed)
Important Message  Patient Details  Name: [name] MRN: [id number] Date of Birth: 1933/09/18   Medicare Important Message Given:  Yes     Orbie Pyo 09/02/2020, 4:25 PM

## 2020-09-03 ENCOUNTER — Ambulatory Visit: Payer: Medicare Other | Admitting: Physician Assistant

## 2020-09-03 LAB — CBC
HCT: 29.1 % — ABNORMAL LOW (ref 36.0–46.0)
Hemoglobin: 9.1 g/dL — ABNORMAL LOW (ref 12.0–15.0)
MCH: 30.5 pg (ref 26.0–34.0)
MCHC: 31.3 g/dL (ref 30.0–36.0)
MCV: 97.7 fL (ref 80.0–100.0)
Platelets: 162 10*3/uL (ref 150–400)
RBC: 2.98 MIL/uL — ABNORMAL LOW (ref 3.87–5.11)
RDW: 13.4 % (ref 11.5–15.5)
WBC: 7.2 10*3/uL (ref 4.0–10.5)
nRBC: 0 % (ref 0.0–0.2)

## 2020-09-03 LAB — BASIC METABOLIC PANEL
Anion gap: 9 (ref 5–15)
BUN: 30 mg/dL — ABNORMAL HIGH (ref 8–23)
CO2: 19 mmol/L — ABNORMAL LOW (ref 22–32)
Calcium: 8.7 mg/dL — ABNORMAL LOW (ref 8.9–10.3)
Chloride: 110 mmol/L (ref 98–111)
Creatinine, Ser: 1.29 mg/dL — ABNORMAL HIGH (ref 0.44–1.00)
GFR, Estimated: 41 mL/min — ABNORMAL LOW (ref >60)
Glucose, Bld: 114 mg/dL — ABNORMAL HIGH (ref 70–99)
Potassium: 5.4 mmol/L — ABNORMAL HIGH (ref 3.5–5.1)
Sodium: 138 mmol/L (ref 135–145)

## 2020-09-03 LAB — GLUCOSE, CAPILLARY
Glucose-Capillary: 97 mg/dL (ref 70–99)
Glucose-Capillary: 123 mg/dL — ABNORMAL HIGH (ref 70–99)
Glucose-Capillary: 78 mg/dL (ref 70–99)
Glucose-Capillary: 154 mg/dL — ABNORMAL HIGH (ref 70–99)

## 2020-09-03 LAB — POTASSIUM: Potassium: 5.4 mmol/L — ABNORMAL HIGH (ref 3.5–5.1)

## 2020-09-03 LAB — PHOSPHORUS: Phosphorus: 1.4 mg/dL — ABNORMAL LOW (ref 2.5–4.6)

## 2020-09-03 LAB — MAGNESIUM: Magnesium: 1.6 mg/dL — ABNORMAL LOW (ref 1.7–2.4)

## 2020-09-03 MED ORDER — SODIUM PHOSPHATES 45 MMOLE/15ML IV SOLN
30.0000 mmol | Freq: Once | INTRAVENOUS | Status: AC
  Administered 2020-09-03: 30 mmol via INTRAVENOUS
  Filled 2020-09-03: qty 10

## 2020-09-03 MED ORDER — SODIUM ZIRCONIUM CYCLOSILICATE 5 G PO PACK
5.0000 g | PACK | Freq: Once | ORAL | Status: AC
  Administered 2020-09-03: 5 g via ORAL
  Filled 2020-09-03: qty 1

## 2020-09-03 MED ORDER — MAGNESIUM SULFATE 2 GM/50ML IV SOLN
2.0000 g | Freq: Once | INTRAVENOUS | Status: AC
  Administered 2020-09-03: 2 g via INTRAVENOUS
  Filled 2020-09-03: qty 50

## 2020-09-03 NOTE — Progress Notes (Signed)
Physical Therapy Treatment Patient Details Name: [name] MRN: [id number] DOB: 04/15/1933 Today's Date: 09/03/2020    History of Present Illness 87 y.o. female  admitted on 08/29/2020 with weakness. Reports of poor PO intake at home. Found to have creatinine of 3.08 (baseline 1.3-1.5); Palliative Care consulted, and plan is to go home with Hospice Services; with past medical history of dementia, chronic diastolic heart failure, symptomatic bradycardia s/p PPM, s/p colostomy, and CKD stage IIIb.    PT Comments    Pt supine in bed but pleasant and agreeable to participate in PT session.  Pt lacks endurance and strength and based on her slow progress would benefit from SNF placement for continued rehab before returning home.    Follow Up Recommendations  SNF     Equipment Recommendations  3in1 (PT)    Recommendations for Other Services       Precautions / Restrictions Precautions Precautions: Fall Restrictions Weight Bearing Restrictions: No    Mobility  Bed Mobility Overal bed mobility: Needs Assistance Bed Mobility: Supine to Sit;Sit to Supine Rolling: Supervision   Supine to sit: Supervision     General bed mobility comments: Pt able to move to edge of bed and back to bed against gravity with supervision for safety and use of bed rails.    Transfers Overall transfer level: Needs assistance Equipment used: Rolling walker (2 wheeled) Transfers: Sit to/from Stand Sit to Stand: Min assist         General transfer comment: Cues for hand placement to push from seated surface as patient attempting to pull on RW.  Ambulation/Gait Ambulation/Gait assistance: Min guard Gait Distance (Feet): 60 Feet Assistive device: Rolling walker (2 wheeled) Gait Pattern/deviations: Step-through pattern;Decreased step length - right;Decreased step length - left;Trunk flexed     General Gait Details: Cues to self-monitor for activity tolerance; good use of RW for balance.  Pt  required cues for pacing.   Stairs             Wheelchair Mobility    Modified Rankin (Stroke Patients Only)       Balance Overall balance assessment: Mild deficits observed, not formally tested                                          Cognition Arousal/Alertness: Awake/alert Behavior During Therapy: WFL for tasks assessed/performed Overall Cognitive Status: History of cognitive impairments - at baseline                                        Exercises      General Comments        Pertinent Vitals/Pain Pain Assessment: No/denies pain    Home Living                      Prior Function            PT Goals (current goals can now be found in the care plan section) Acute Rehab PT Goals Patient Stated Goal: To go home Potential to Achieve Goals: Good Progress towards PT goals: Progressing toward goals    Frequency    Min 3X/week      PT Plan Current plan remains appropriate    Co-evaluation  AM-PAC PT "6 Clicks" Mobility   Outcome Measure  Help needed turning from your back to your side while in a flat bed without using bedrails?: A Little Help needed moving from lying on your back to sitting on the side of a flat bed without using bedrails?: A Little Help needed moving to and from a bed to a chair (including a wheelchair)?: A Little Help needed standing up from a chair using your arms (e.g., wheelchair or bedside chair)?: A Little Help needed to walk in hospital room?: A Little Help needed climbing 3-5 steps with a railing? : A Lot 6 Click Score: 17    End of Session Equipment Utilized During Treatment: Gait belt Activity Tolerance: Patient tolerated treatment well Patient left: in bed;with call bell/phone within reach;with chair alarm set (chair alarm pad in bed as bed exit alarm would not set.) Nurse Communication: Mobility status PT Visit Diagnosis: Unsteadiness on feet  (R26.81);Muscle weakness (generalized) (M62.81)     Time: 1245-1310 PT Time Calculation (min) (ACUTE ONLY): 25 min  Charges:  $Gait Training: 8-22 mins $Therapeutic Activity: 8-22 mins                     Erasmo Leventhal , PTA Acute Rehabilitation Services Pager [phone] Office [phone]    Akeila Lana Eli Hose 09/03/2020, 3:20 PM

## 2020-09-03 NOTE — NC FL2 (Signed)
[place] MEDICAID FL2 LEVEL OF CARE SCREENING TOOL     IDENTIFICATION  Patient Name: [name] Birthdate: November 11, 1933 Sex: female Admission Date (Current Location): 08/29/2020  [facility] and [place] Number:  Herbalist and Address:  [facility], [place] [address] [place], [place] [address]      Provider Number: [id number]  Attending Physician Name and Address:  Shawna Clamp, MD  Relative Name and Phone Number:       Current Level of Care: Hospital Recommended Level of Care: [place] Prior Approval Number:    Date Approved/Denied:   PASRR Number: [id number]  Discharge Plan: Home    Current Diagnoses: Patient Active Problem List   Diagnosis Date Noted   Malnutrition of moderate degree 08/30/2020   Protein-calorie malnutrition, severe 08/30/2020   Acute kidney injury superimposed on chronic kidney disease ([place]) 08/29/2020   Dehydration 08/29/2020   Hyperkalemia 08/29/2020   Macrocytic anemia 08/29/2020   COVID-19 virus infection 04/07/2020   Pacemaker 12/21/2019   UTI (urinary tract infection) 05/11/2019   ARF (acute renal failure) ([place]) 05/10/2019   Acute lower UTI 05/10/2019   Symptomatic bradycardia 05/01/2018   Near syncope 05/01/2018   CKD (chronic kidney disease) stage 3, GFR 30-59 ml/min 05/01/2018   Influenza B 04/10/2018   Generalized weakness 04/10/2018   Essential hypertension 03/01/2018   COPD (chronic obstructive pulmonary disease) ([place]) 03/01/2018   Bradycardia 12/15/2017   AKI (acute kidney injury) ([place]) 07/18/2017   Colostomy present ([place]) 11/08/2016   Syncope 10/08/2016   Acute kidney injury ([place]) 10/08/2016   Abnormal chest x-ray 10/08/2016   Lethargy 10/08/2016   Dementia ([place])    Sepsis ([place]) 09/05/2016   Community acquired pneumonia 09/05/2016   Acute encephalopathy 09/05/2016   Diabetes mellitus with complication ([place]) [id number]   Nausea & vomiting 04/09/2012   Gallstones  03/15/2011   Partial SBO 03/15/2011   Colostomy hernia ([place]) 03/15/2011   Hypercalcemia 02/28/2007   IRON DEFIC ANEMIA [place] DIET IRON INTAKE 02/28/2007   DISCITIS 02/28/2007   OSTEOMYELITIS 02/28/2007   CHILLS WITHOUT FEVER 02/28/2007    Orientation RESPIRATION BLADDER Height & Weight     Self, Time, Situation, Place  Normal Incontinent Weight: 119 lb 1.6 oz (54 kg) Height:     BEHAVIORAL SYMPTOMS/MOOD NEUROLOGICAL BOWEL NUTRITION STATUS      Incontinent Diet  AMBULATORY STATUS COMMUNICATION OF NEEDS Skin   Limited Assist Verbally Normal                       Personal Care Assistance Level of Assistance  Bathing, Feeding, Dressing Bathing Assistance: Limited assistance Feeding assistance: Independent Dressing Assistance: Limited assistance     Functional Limitations Info  Sight, Hearing, Speech Sight Info: Adequate Hearing Info: Adequate Speech Info: Adequate    SPECIAL CARE FACTORS FREQUENCY  PT (By licensed PT), OT (By licensed OT)     PT Frequency: 5x a week OT Frequency: 5x a week            Contractures Contractures Info: Not present    Additional Factors Info  Code Status, Allergies Code Status Info: DNR Allergies Info: Asprin           Current Medications (09/03/2020):  This is the current hospital active medication list Current Facility-Administered Medications  Medication Dose Route Frequency Provider Last Rate Last Admin   acetaminophen (TYLENOL) tablet 650 mg  650 mg Oral Q6H PRN Norval Morton, MD  albuterol (PROVENTIL) (2.5 MG/3ML) 0.083% nebulizer solution 2.5 mg  2.5 mg Nebulization Q6H PRN Tamala Julian, Rondell A, MD       busPIRone (BUSPAR) tablet 5 mg  5 mg Oral TID Fuller Plan A, MD   5 mg at 09/03/20 0840   dextrose 50 % solution 50 mL  1 ampule Intravenous Once PRN Fuller Plan A, MD       donepezil (ARICEPT) tablet 10 mg  10 mg Oral QHS Smith, Rondell A, MD   10 mg at 09/02/20 2142   escitalopram (LEXAPRO) tablet 10 mg  10  mg Oral QPM Smith, Rondell A, MD   10 mg at 09/02/20 1648   feeding supplement (ENSURE ENLIVE / ENSURE PLUS) liquid 237 mL  237 mL Oral TID BM Lavina Hamman, MD   237 mL at 09/03/20 0841   heparin injection 5,000 Units  5,000 Units Subcutaneous Q8H Smith, Rondell A, MD   5,000 Units at 09/03/20 1313   memantine (NAMENDA XR) 24 hr capsule 28 mg  28 mg Oral q morning Smith, Rondell A, MD   28 mg at 09/03/20 0840   montelukast (SINGULAIR) tablet 10 mg  10 mg Oral QHS Smith, Rondell A, MD   10 mg at 09/02/20 2143   multivitamin with minerals tablet 1 tablet  1 tablet Oral Daily Lavina Hamman, MD   1 tablet at 09/03/20 0840   ondansetron (ZOFRAN) tablet 4 mg  4 mg Oral Q6H PRN Fuller Plan A, MD       Or   ondansetron (ZOFRAN) injection 4 mg  4 mg Intravenous Q6H PRN Tamala Julian, Rondell A, MD       pravastatin (PRAVACHOL) tablet 10 mg  10 mg Oral QHS Smith, Rondell A, MD   10 mg at 09/02/20 2143   sodium chloride flush (NS) 0.9 % injection 3 mL  3 mL Intravenous Q12H Smith, Rondell A, MD   3 mL at 09/02/20 2152   sodium phosphate 30 mmol in dextrose 5 % 250 mL infusion  30 mmol Intravenous Once Shawna Clamp, MD 43 mL/hr at 09/03/20 1316 30 mmol at 09/03/20 1316     Discharge Medications: Please see discharge summary for a list of discharge medications.  Relevant Imaging Results:  Relevant Lab Results:   Additional Information [place], [place]

## 2020-09-03 NOTE — Progress Notes (Signed)
Dear PROGRESS NOTE    [name]  [id number] DOB: 1933-08-05 DOA: 08/29/2020 PCP: Leeroy Cha, MD   Brief Narrative: This 87 years old female with history of dementia, chronic CHF, bradycardia s/p pacemaker and CKD stage IIIb presented to the ED with generalized weakness and was found to have acute kidney injury.  Palliative care consulted.  PT recommended skilled nursing facility.   Assessment & Plan:   Principal Problem:   Acute kidney injury superimposed on chronic kidney disease ([place]) Active Problems:   Hypercalcemia   Diabetes mellitus with complication (HCC)   Dementia (HCC)   Colostomy present ([place])   Pacemaker   Dehydration   Hyperkalemia   Macrocytic anemia   Malnutrition of moderate degree   Protein-calorie malnutrition, severe    Acute kidney injury on CKD stage IIIb: She presented with serum creatinine 3.08 at presentation with a baseline serum creatinine 1.4. This could be due to decreased p.o. intake. Serum creatinine improved rapidly with volume resuscitation.   Avoid nephrotoxic medications.  Serum creatinine 1.29 today  Hyperkalemia: Lokelma x 1 given. Recheck potassium  Hypercalcemia: Could be due to dehydration,  resolved with volume expansion.  Hypomagnesemia, hypophosphatemia: Replacement in progress,  recheck labs in the morning.  Dementia: No behavioral problems. Continue supportive care.   Type 2 diabetes :  Monitor off medications.  Well-controlled   Symptomatic bradycardia status post pacemaker placement. Stable.   SBO status post resection 2012 with chronic colostomy: Stable.   COPD Stable. continue home inhalers.      DVT prophylaxis: Subcu heparin Code Status: DNR Family Communication: No family at bedside Disposition Plan:   Status is: Inpatient  Remains inpatient appropriate because:Inpatient level of care appropriate due to severity of illness  Dispo: The patient is from: Home               Anticipated d/c is to: SNF              Patient currently is not medically stable to d/c.   Difficult to place patient No    Consultants:  Palliative care  Procedures:  None Antimicrobials:   Anti-infectives (From admission, onward)    Start     Dose/Rate Route Frequency Ordered Stop   08/29/20 1530  cefTRIAXone (ROCEPHIN) 1 g in sodium chloride 0.9 % 100 mL IVPB  Status:  Discontinued        1 g 200 mL/hr over 30 Minutes Intravenous  Once 08/29/20 1523 08/29/20 1524        Subjective: Patient was seen and examined at bedside.  Overnight events noted.   Patient reports feeling much improved.  She denies any symptoms.  Objective: Vitals:   09/02/20 1936 09/03/20 0422 09/03/20 1314 09/03/20 1315  BP: 136/69 127/66 (!) 131/58 (!) 133/58  Pulse: 67 60 66   Resp: '18 18 18   '$ Temp: 99.7 F (37.6 C) 98.6 F (37 C) 98.9 F (37.2 C)   TempSrc: Oral Oral Oral   SpO2: 100% 100% 100%   Weight:        Intake/Output Summary (Last 24 hours) at 09/03/2020 1444 Last data filed at 09/03/2020 0900 Gross per 24 hour  Intake 900 ml  Output 1850 ml  Net -950 ml   Filed Weights   08/31/20 0520 09/01/20 0440 09/01/20 0919  Weight: 54.5 kg 53.9 kg 54 kg    Examination:  General exam: Appears calm and comfortable, not in any acute distress. Respiratory system: Clear to auscultation. Respiratory effort normal. Cardiovascular  system: S1 & S2 heard, RRR. No JVD, murmurs, rubs, gallops or clicks. No pedal edema. Gastrointestinal system: Abdomen is nondistended, soft and nontender. No organomegaly or masses felt. Normal bowel sounds heard. Central nervous system: Alert and oriented. No focal neurological deficits. Extremities:  No edema, no cyanosis, no clubbing. Skin: No rashes, lesions or ulcers Psychiatry: Judgement and insight appear normal. Mood & affect appropriate.     Data Reviewed: I have personally reviewed following labs and imaging studies  CBC: Recent Labs  Lab  08/29/20 1406 08/30/20 0116 08/31/20 0128 09/03/20 0159  WBC 5.4 5.7 4.6 7.2  NEUTROABS 2.9 2.9 2.4  --   HGB 10.2* 10.4* 10.1* 9.1*  HCT 33.8* 33.0* 31.9* 29.1*  MCV 100.3* 96.5 97.0 97.7  PLT 219 221 100* 0000000   Basic Metabolic Panel: Recent Labs  Lab 08/29/20 1300 08/30/20 0116 08/31/20 0821 09/03/20 0159 09/03/20 0936  NA 140 137 136 138  --   K 5.4* 4.9 4.8 5.4* 5.4*  CL 110 110 110 110  --   CO2 18* 18* 20* 19*  --   GLUCOSE 77 68* 141* 114*  --   BUN 57* 46* 32* 30*  --   CREATININE 3.08* 2.26* 1.38* 1.29*  --   CALCIUM 10.5* 9.5 8.7* 8.7*  --   MG  --   --   --  1.6*  --   PHOS  --   --   --  1.4*  --    GFR: Estimated Creatinine Clearance: 27.2 mL/min (A) (by C-G formula based on SCr of 1.29 mg/dL (H)). Liver Function Tests: Recent Labs  Lab 08/29/20 1300 08/31/20 0821  AST 15 16  ALT 6 9  ALKPHOS 66 55  BILITOT 0.5 0.6  PROT 6.8 5.4*  ALBUMIN 3.8 2.7*   No results for input(s): LIPASE, AMYLASE in the last 168 hours. No results for input(s): AMMONIA in the last 168 hours. Coagulation Profile: No results for input(s): INR, PROTIME in the last 168 hours. Cardiac Enzymes: No results for input(s): CKTOTAL, CKMB, CKMBINDEX, TROPONINI in the last 168 hours. BNP (last 3 results) No results for input(s): PROBNP in the last 8760 hours. HbA1C: No results for input(s): HGBA1C in the last 72 hours. CBG: Recent Labs  Lab 09/02/20 1101 09/02/20 1659 09/03/20 0646 09/03/20 0732 09/03/20 1117  GLUCAP 165* 191* 97 123* 78   Lipid Profile: No results for input(s): CHOL, HDL, LDLCALC, TRIG, CHOLHDL, LDLDIRECT in the last 72 hours. Thyroid Function Tests: No results for input(s): TSH, T4TOTAL, FREET4, T3FREE, THYROIDAB in the last 72 hours. Anemia Panel: No results for input(s): VITAMINB12, FOLATE, FERRITIN, TIBC, IRON, RETICCTPCT in the last 72 hours. Sepsis Labs: No results for input(s): PROCALCITON, LATICACIDVEN in the last 168 hours.  Recent Results  (from the past 240 hour(s))  SARS CORONAVIRUS 2 (TAT 6-24 HRS) Nasopharyngeal Nasopharyngeal Swab     Status: None   Collection Time: 08/29/20  2:40 PM   Specimen: Nasopharyngeal Swab  Result Value Ref Range Status   SARS Coronavirus 2 NEGATIVE NEGATIVE Final    Comment: (NOTE) SARS-CoV-2 target nucleic acids are NOT DETECTED.  The SARS-CoV-2 RNA is generally detectable in upper and lower respiratory specimens during the acute phase of infection. Negative results do not preclude SARS-CoV-2 infection, do not rule out co-infections with other pathogens, and should not be used as the sole basis for treatment or other patient management decisions. Negative results must be combined with clinical observations, patient history, and epidemiological information. The  expected result is Negative.  Fact Sheet for Patients: SugarRoll.be  Fact Sheet for Healthcare Providers: https://www.woods-mathews.com/  This test is not yet approved or cleared by the [place] FDA and  has been authorized for detection and/or diagnosis of SARS-CoV-2 by FDA under an Emergency Use Authorization (EUA). This EUA will remain  in effect (meaning this test can be used) for the duration of the COVID-19 declaration under Se ction 564(b)(1) of the Act, 21 U.S.C. section 360bbb-3(b)(1), unless the authorization is terminated or revoked sooner.  Performed at [facility], [place] [address], [place], [place] [address]     Radiology Studies: No results found.  Scheduled Meds:  busPIRone  5 mg Oral TID   donepezil  10 mg Oral QHS   escitalopram  10 mg Oral QPM   feeding supplement  237 mL Oral TID BM   heparin  5,000 Units Subcutaneous Q8H   memantine  28 mg Oral q morning   montelukast  10 mg Oral QHS   multivitamin with minerals  1 tablet Oral Daily   pravastatin  10 mg Oral QHS   sodium chloride flush  3 mL Intravenous Q12H   sodium zirconium cyclosilicate  5  g Oral Once   Continuous Infusions:  sodium phosphate  Dextrose 5% IVPB 30 mmol (09/03/20 1316)     LOS: 5 days    Time spent: 25 mins    Jeevan Kalla, MD [facility]   If 7PM-7AM, please contact night-coverage

## 2020-09-04 LAB — MAGNESIUM: Magnesium: 2 mg/dL (ref 1.7–2.4)

## 2020-09-04 LAB — CBC
HCT: 32.8 % — ABNORMAL LOW (ref 36.0–46.0)
Hemoglobin: 10.2 g/dL — ABNORMAL LOW (ref 12.0–15.0)
MCH: 30.7 pg (ref 26.0–34.0)
MCHC: 31.1 g/dL (ref 30.0–36.0)
MCV: 98.8 fL (ref 80.0–100.0)
Platelets: 142 10*3/uL — ABNORMAL LOW (ref 150–400)
RBC: 3.32 MIL/uL — ABNORMAL LOW (ref 3.87–5.11)
RDW: 13.8 % (ref 11.5–15.5)
WBC: 5.8 10*3/uL (ref 4.0–10.5)
nRBC: 0 % (ref 0.0–0.2)

## 2020-09-04 LAB — BASIC METABOLIC PANEL
Anion gap: 8 (ref 5–15)
BUN: 31 mg/dL — ABNORMAL HIGH (ref 8–23)
CO2: 20 mmol/L — ABNORMAL LOW (ref 22–32)
Calcium: 8.7 mg/dL — ABNORMAL LOW (ref 8.9–10.3)
Chloride: 109 mmol/L (ref 98–111)
Creatinine, Ser: 1.39 mg/dL — ABNORMAL HIGH (ref 0.44–1.00)
GFR, Estimated: 37 mL/min — ABNORMAL LOW (ref >60)
Glucose, Bld: 101 mg/dL — ABNORMAL HIGH (ref 70–99)
Potassium: 5.3 mmol/L — ABNORMAL HIGH (ref 3.5–5.1)
Sodium: 137 mmol/L (ref 135–145)

## 2020-09-04 LAB — PHOSPHORUS: Phosphorus: 4.5 mg/dL (ref 2.5–4.6)

## 2020-09-04 LAB — GLUCOSE, CAPILLARY
Glucose-Capillary: 98 mg/dL (ref 70–99)
Glucose-Capillary: 129 mg/dL — ABNORMAL HIGH (ref 70–99)
Glucose-Capillary: 180 mg/dL — ABNORMAL HIGH (ref 70–99)

## 2020-09-04 MED ORDER — SODIUM ZIRCONIUM CYCLOSILICATE 10 G PO PACK
10.0000 g | PACK | Freq: Two times a day (BID) | ORAL | Status: DC
  Administered 2020-09-04 – 2020-09-06 (×6): 10 g via ORAL
  Filled 2020-09-04 (×6): qty 1

## 2020-09-04 NOTE — Progress Notes (Signed)
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 Lovena Le  COVID   Admit 08/29/2020 with poor appetite over past several weeks--Found to have found to have acute kidney injury in the setting of poor p.o. in addition to hyperkalemia Admitted for the same Palliative care consulted recommended   Hospital-Problem based course Acute kidney injury 2/2 ATN from poor p.o. intake Hyperkalemia AKI has resolved rapidly with saline-we have discontinued fluids Increase Lokelma to 10 twice daily Stop checking labs daily however would check in outpatient setting Liberalize diet Symptomatic bradycardia status post pacemaker as above No issues at this time stable off of telemetry Chronic colostomy after proctocolectomy in 2004 Patient has no recollection of why this was placed Continue changes as needed Moderate dementia stage III-IV at least Continue Namenda every morning, Aricept 10 at bedtime Continue BuSpar 5 3 times daily and Lexapro 10 nightly for behavioral issues Currently is stable I have minimized certain meds such as Singulair as well as Pravachol given likely progressive nature of this process and lack of mortality benefit-appreciate palliative medicine input  DVT prophylaxis: Heparin Code Status: Currently DNR Family Communication: None Disposition:  Status is: Inpatient  Remains inpatient appropriate because:Unsafe d/c plan  Dispo: The patient is from: Home              Anticipated d/c is to: SNF              Patient currently is medically stable to d/c.   Difficult to place patient No       Consultants:  Palliative care  Procedures:    Antimicrobials:     Subjective: Awake somewhat coherent at times but gets confused easily No overt complaints of pain or fever "" I need a blanket, I am cold" Rest of the review of systems otherwise unremarkable but cannot be relied upon  Objective: Vitals:   09/03/20 1955 09/03/20 2000 09/04/20 0540 09/04/20 1436  BP: (!) 123/57  118/64 116/61  Pulse: 67  61 64  Resp: '18  18 18  '$ Temp: 98.3 F (36.8 C)  97.8 F (36.6 C) 98.6 F (37 C)  TempSrc: Oral  Oral Oral  SpO2: 100%  100% 99%  Weight:  54.5 kg      Intake/Output Summary (Last 24 hours) at 09/04/2020 1727 Last data filed at 09/04/2020 1300 Gross per 24 hour  Intake 450 ml  Output 1750 ml  Net -1300 ml   Filed Weights   09/01/20 0440 09/01/20 0919 09/03/20 2000  Weight: 53.9 kg 54 kg 54.5 kg    Examination:  Coherent x1 No icterus no pallor, thick neck Mallampati 4 ?  Holosystolic murmur-no JVD no bruit Chest clear posterolaterally and in lateral lung fields Abdomen obese with ostomy in l right lower quadrant Moving 4 limbs equally power 5/5   Data Reviewed: personally reviewed   CBC    Component Value Date/Time   WBC 5.8 09/04/2020 0142   RBC 3.32 (L) 09/04/2020 0142   HGB 10.2 (L) 09/04/2020 0142   HCT 32.8 (L) 09/04/2020 0142   HCT 38.7 10/08/2016 1706   PLT 142 (L) 09/04/2020 0142   MCV 98.8 09/04/2020 0142   MCH 30.7 09/04/2020 0142   MCHC 31.1 09/04/2020 0142   RDW  13.8 09/04/2020 0142   LYMPHSABS 1.6 08/31/2020 0128   MONOABS 0.4 08/31/2020 0128   EOSABS 0.2 08/31/2020 0128   BASOSABS 0.0 08/31/2020 0128   CMP Latest Ref Rng & Units 09/04/2020 09/03/2020 09/03/2020  Glucose 70 - 99 mg/dL 101(H) - 114(H)  BUN 8 - 23 mg/dL 31(H) - 30(H)  Creatinine 0.44 - 1.00 mg/dL 1.39(H) - 1.29(H)  Sodium 135 - 145 mmol/L 137 - 138  Potassium 3.5 - 5.1 mmol/L 5.3(H) 5.4(H) 5.4(H)  Chloride 98 - 111 mmol/L 109 - 110  CO2 22 - 32 mmol/L 20(L) - 19(L)  Calcium 8.9 - 10.3 mg/dL 8.7(L) - 8.7(L)  Total  Protein 6.5 - 8.1 g/dL - - -  Total Bilirubin 0.3 - 1.2 mg/dL - - -  Alkaline Phos 38 - 126 U/L - - -  AST 15 - 41 U/L - - -  ALT 0 - 44 U/L - - -     Radiology Studies: No results found.   Scheduled Meds:  busPIRone  5 mg Oral TID   donepezil  10 mg Oral QHS   escitalopram  10 mg Oral QPM   feeding supplement  237 mL Oral TID BM   heparin  5,000 Units Subcutaneous Q8H   memantine  28 mg Oral q morning   montelukast  10 mg Oral QHS   multivitamin with minerals  1 tablet Oral Daily   pravastatin  10 mg Oral QHS   sodium chloride flush  3 mL Intravenous Q12H   sodium zirconium cyclosilicate  10 g Oral BID   Continuous Infusions:   LOS: 6 days   Time spent: 15  Nita Sells, MD [facility] To contact the attending provider between 7A-7P or the covering provider during after hours 7P-7A, please log into the web site [email or web address] and access using universal [place] password for that web site. If you do not have the password, please call the hospital operator.  09/04/2020, 5:27 PM

## 2020-09-04 NOTE — Progress Notes (Signed)
Occupational Therapy Treatment Patient Details Name: [name] MRN: [id number] DOB: 01-06-1934 Today's Date: 09/04/2020    History of present illness 87 y.o. female  admitted on 08/29/2020 with weakness. Reports of poor PO intake at home. Found to have creatinine of 3.08 (baseline 1.3-1.5); Palliative Care consulted, and plan is to go home with Hospice Services; with past medical history of dementia, chronic diastolic heart failure, symptomatic bradycardia s/p PPM, s/p colostomy, and CKD stage IIIb.   OT comments  [name] is progressing really well. She completed sponge bathing ADLs at chair level with sit<>stands given min guard/min A overall. Pt benefited from one step verbal cues for all tasks due to poor initiation and problem solving. Pt able to complete functional tasks in standing with one UE externally supported. Pt walked with RW room distances given min guard for safety. Pt continues to benefit from skill OT to progress function in ADLs and mobility. D/c rec remains appropriate.    Follow Up Recommendations  SNF;Supervision/Assistance - 24 hour    Equipment Recommendations  None recommended by OT       Precautions / Restrictions Precautions Precautions: Fall Restrictions Weight Bearing Restrictions: No       Mobility Bed Mobility Overal bed mobility: Needs Assistance Bed Mobility: Sit to Supine Rolling: Min assist     Sit to supine: Min assist   General bed mobility comments: min A for BLE management    Transfers Overall transfer level: Needs assistance Equipment used: Rolling walker (2 wheeled) Transfers: Sit to/from Stand Sit to Stand: Min guard         General transfer comment: close min guard for safety    Balance Overall balance assessment: Mild deficits observed, not formally tested;History of Falls         ADL either performed or assessed with clinical judgement   ADL Overall ADL's : Needs assistance/impaired         Upper Body Bathing:  Min guard;Sitting   Lower Body Bathing: Minimal assistance;Sit to/from stand Lower Body Bathing Details (indicate cue type and reason): pt able bathe perineal area in standing with min A external support for balance and safety Upper Body Dressing : Min guard;Sitting                   Functional mobility during ADLs: Min guard;Cueing for safety;Rolling walker General ADL Comments: pt required on step verbal cues for ADL tasks      Cognition Arousal/Alertness: Awake/alert Behavior During Therapy: WFL for tasks assessed/performed Overall Cognitive Status: History of cognitive impairments - at baseline     General Comments: requires simple one step verbal cues for ADLs and mobility - very pleasant        Exercises General Exercises - Lower Extremity Long Arc Quad: AROM;Both;15 reps;Seated Hip Flexion/Marching: AROM;Both;15 reps;Seated Other Exercises Other Exercises: B shoulder flexion seated x 15 reps.   Shoulder Instructions       General Comments no new concerns noted    Pertinent Vitals/ Pain       Pain Assessment: Faces Faces Pain Scale: Hurts a little bit Pain Location: generalized with mvoement   Frequency  Min 2X/week        Progress Toward Goals  OT Goals(current goals can now be found in the care plan section)  Progress towards OT goals: Progressing toward goals  Acute Rehab OT Goals Patient Stated Goal: To go home OT Goal Formulation: With patient Time For Goal Achievement: 09/15/20 Potential to Achieve Goals: Good ADL Goals  Pt Will Perform Grooming: with modified independence;standing Pt Will Perform Upper Body Dressing: with modified independence;sitting Pt Will Perform Lower Body Dressing: with modified independence;with adaptive equipment;sitting/lateral leans;sit to/from stand Pt Will Transfer to Toilet: with modified independence;ambulating  Plan Discharge plan remains appropriate       AM-PAC OT "6 Clicks" Daily Activity      Outcome Measure   Help from another person eating meals?: A Little Help from another person taking care of personal grooming?: A Little Help from another person toileting, which includes using toliet, bedpan, or urinal?: A Little Help from another person bathing (including washing, rinsing, drying)?: A Little Help from another person to put on and taking off regular upper body clothing?: A Little Help from another person to put on and taking off regular lower body clothing?: A Little 6 Click Score: 18    End of Session Equipment Utilized During Treatment: Rolling walker  OT Visit Diagnosis: Unsteadiness on feet (R26.81);Other abnormalities of gait and mobility (R26.89);Muscle weakness (generalized) (M62.81)   Activity Tolerance Patient tolerated treatment well   Patient Left in bed;with bed alarm set;with call bell/phone within reach   Nurse Communication Mobility status (purwick out, encourage pt to get up and walk to bathroom)        Time: 1427-1450 OT Time Calculation (min): 23 min  Charges: OT General Charges $OT Visit: 1 Visit OT Treatments $Self Care/Home Management : 8-22 mins    Presten Joost A Sherline Eberwein 09/04/2020, 2:58 PM

## 2020-09-04 NOTE — Progress Notes (Signed)
Physical Therapy Treatment Patient Details Name: [name] MRN: [id number] DOB: 07-27-1933 Today's Date: 09/04/2020    History of Present Illness 87 y.o. female  admitted on 08/29/2020 with weakness. Reports of poor PO intake at home. Found to have creatinine of 3.08 (baseline 1.3-1.5); Palliative Care consulted, and plan is to go home with Hospice Services; with past medical history of dementia, chronic diastolic heart failure, symptomatic bradycardia s/p PPM, s/p colostomy, and CKD stage IIIb.    PT Comments    Pt supine in bed on arrival this session.  Pt performed gt training and LE strengthening with improved tolerance she continues to require min to min guard assistance this session.  Pt remains to benefit from short term snf placement to improve strength and function before returning home.      Follow Up Recommendations  SNF     Equipment Recommendations  3in1 (PT)    Recommendations for Other Services       Precautions / Restrictions Precautions Precautions: Fall Restrictions Weight Bearing Restrictions: No    Mobility  Bed Mobility Overal bed mobility: Needs Assistance Bed Mobility: Supine to Sit Rolling: Min assist         General bed mobility comments: Min assistance to move into sitting.  Presents with posterior pelvic tilt sitting edge of bed.    Transfers Overall transfer level: Needs assistance Equipment used: Rolling walker (2 wheeled) Transfers: Sit to/from Stand Sit to Stand: Min assist         General transfer comment: Cues for hand placement to push from seated surface and reach back for seated surface.  Ambulation/Gait Ambulation/Gait assistance: Min guard Gait Distance (Feet): 80 Feet Assistive device: Rolling walker (2 wheeled) Gait Pattern/deviations: Step-through pattern;Decreased step length - right;Decreased step length - left;Trunk flexed     General Gait Details: Cues for RW safety and posture.  Pt with increased distance and  appears less fatigued then previous session.   Stairs             Wheelchair Mobility    Modified Rankin (Stroke Patients Only)       Balance Overall balance assessment: Mild deficits observed, not formally tested                                          Cognition Arousal/Alertness: Awake/alert Behavior During Therapy: WFL for tasks assessed/performed Overall Cognitive Status: History of cognitive impairments - at baseline                                        Exercises General Exercises - Lower Extremity Long Arc Quad: AROM;Both;15 reps;Seated Hip Flexion/Marching: AROM;Both;15 reps;Seated Other Exercises Other Exercises: B shoulder flexion seated x 15 reps.    General Comments        Pertinent Vitals/Pain Pain Assessment: No/denies pain    Home Living                      Prior Function            PT Goals (current goals can now be found in the care plan section) Acute Rehab PT Goals Patient Stated Goal: To go home Potential to Achieve Goals: Good Progress towards PT goals: Progressing toward goals    Frequency    Min 3X/week  PT Plan Current plan remains appropriate    Co-evaluation              AM-PAC PT "6 Clicks" Mobility   Outcome Measure  Help needed turning from your back to your side while in a flat bed without using bedrails?: A Little Help needed moving from lying on your back to sitting on the side of a flat bed without using bedrails?: A Little Help needed moving to and from a bed to a chair (including a wheelchair)?: A Little Help needed standing up from a chair using your arms (e.g., wheelchair or bedside chair)?: A Little Help needed to walk in hospital room?: A Little Help needed climbing 3-5 steps with a railing? : A Lot 6 Click Score: 17    End of Session Equipment Utilized During Treatment: Gait belt Activity Tolerance: Patient tolerated treatment well Patient  left: in chair;with call bell/phone within reach;with chair alarm set Nurse Communication: Mobility status PT Visit Diagnosis: Unsteadiness on feet (R26.81);Muscle weakness (generalized) (M62.81)     Time: [id number] PT Time Calculation (min) (ACUTE ONLY): 25 min  Charges:  $Gait Training: 8-22 mins $Therapeutic Exercise: 8-22 mins                     Erasmo Leventhal , PTA Acute Rehabilitation Services Pager [phone] Office [phone]    Mercedes Valeriano Eli Hose 09/04/2020, 2:32 PM

## 2020-09-04 NOTE — TOC Progression Note (Addendum)
Transition of Care [facility]) - Progression Note    Patient Details  Name: [name] MRN: [id number] Date of Birth: 03/04/1934  Transition of Care [facility]) CM/SW [place], [place] Phone Number: 09/04/2020, 12:44 PM  Clinical Narrative:     CSW gave [place] daughter [name] bed offers. [name] will review with family and call CSW back by the end of day.  3:30pm: Daughter called CSW back and decided on [facility]. [facility] stated they will not know If they will have a bed for pt until the morning. CSW requested covid test from MD. CSW started insurance auth in portal.  Expected Discharge Plan: [place] Barriers to Discharge: Continued Medical Work up  Expected Discharge Plan and Services Expected Discharge Plan: [place]   Discharge Planning Services: CM Consult Post Acute Care Choice: [place] Living arrangements for the past 2 months: Single Family Home                                       Social Determinants of Health (SDOH) Interventions    Readmission Risk Interventions No flowsheet data found.  Emeterio Reeve, Latanya Presser, [place] Social Worker [phone]

## 2020-09-05 LAB — RENAL FUNCTION PANEL
Albumin: 2.5 g/dL — ABNORMAL LOW (ref 3.5–5.0)
Anion gap: 5 (ref 5–15)
BUN: 30 mg/dL — ABNORMAL HIGH (ref 8–23)
CO2: 23 mmol/L (ref 22–32)
Calcium: 8.5 mg/dL — ABNORMAL LOW (ref 8.9–10.3)
Chloride: 109 mmol/L (ref 98–111)
Creatinine, Ser: 1.37 mg/dL — ABNORMAL HIGH (ref 0.44–1.00)
GFR, Estimated: 38 mL/min — ABNORMAL LOW (ref >60)
Glucose, Bld: 119 mg/dL — ABNORMAL HIGH (ref 70–99)
Phosphorus: 3.5 mg/dL (ref 2.5–4.6)
Potassium: 4.8 mmol/L (ref 3.5–5.1)
Sodium: 137 mmol/L (ref 135–145)

## 2020-09-05 LAB — GLUCOSE, CAPILLARY
Glucose-Capillary: 118 mg/dL — ABNORMAL HIGH (ref 70–99)
Glucose-Capillary: 130 mg/dL — ABNORMAL HIGH (ref 70–99)
Glucose-Capillary: 208 mg/dL — ABNORMAL HIGH (ref 70–99)

## 2020-09-05 LAB — SARS CORONAVIRUS 2 (TAT 6-24 HRS): SARS Coronavirus 2: POSITIVE — AB

## 2020-09-05 MED ORDER — SODIUM ZIRCONIUM CYCLOSILICATE 10 G PO PACK: 10.0000 g | PACK | Freq: Two times a day (BID) | ORAL | Status: DC

## 2020-09-05 MED ORDER — ESCITALOPRAM OXALATE 10 MG PO TABS: 10.0000 mg | ORAL_TABLET | Freq: Every evening | ORAL | 0 refills | Status: DC

## 2020-09-05 MED ORDER — MEMANTINE HCL ER 28 MG PO CP24: 28.0000 mg | ORAL_CAPSULE | Freq: Every morning | ORAL | 0 refills | Status: AC

## 2020-09-05 MED ORDER — DONEPEZIL HCL 10 MG PO TABS: 10.0000 mg | ORAL_TABLET | Freq: Every day | ORAL | 0 refills | Status: AC

## 2020-09-05 NOTE — Discharge Summary (Addendum)
Physician Discharge Summary  [name] [id number] DOB: 04-05-1933 DOA: 08/29/2020  PCP: Leeroy Cha, MD  Admit date: 08/29/2020 Discharge date: 09/05/2020  Time spent: 33 minutes  Recommendations for Outpatient Follow-up:  Requires outpatient management of hyperkalemia based PCP input input-would only repeat labs every 3 to 4 days and if continues to decline would consider palliative care/hospice Recommend continuation of all meds for dementia-prescription filled  Discharge Diagnoses:  MAIN problem for hospitalization   acute kidney injury  Please see below for itemized issues addressed in [place]- refer to other progress notes for clarity if needed  Discharge Condition: Fair to guarded  Diet recommendation: Regular liberalized diet  Filed Weights   09/01/20 0440 09/01/20 0919 09/03/20 2000  Weight: 53.9 kg 54 kg 54.5 kg    History of present illness:  87year-old black female Lumbar stenosis status post decompression 2008, 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 Lovena Le COVID     Admit 08/29/2020 with poor appetite over past several weeks--Found to have found to have acute kidney injury in the setting of poor p.o. in addition to hyperkalemia Admitted for the same Palliative care consulted recommended  Hospital Course:  Acute kidney injury 2/2 ATN from poor p.o. 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 Patient has no recollection of why this was placed Continue changes  as needed Moderate dementia stage III-IV at least Continue Namenda every morning, Aricept 10 at bedtime Continue BuSpar 5 3 times daily and Lexapro 10 nightly for behavioral issues Currently is stable I have minimized certain meds such as Singulair as well as Pravachol given likely progressive nature of this process and lack of mortality benefit-appreciate palliative medicine input   Discharge Exam: Vitals:   09/04/20 2131 09/05/20 0519  BP: (!) 124/54 (!) 115/56  Pulse: 64 60  Resp: 17 17  Temp: 99.2 F (37.3 C) 98.3 F (36.8 C)  SpO2: 100% 99%    Subj on day of d/c  Slight confusion   General Exam on discharge  No changes to prior assesment  No distress EOMI NCAT  CTA B no rales no rhonchi Abdomen soft Neurologically moving all 4 limbs  Discharge Instructions   Discharge Instructions     Diet - low sodium heart healthy   Complete by: As directed    Increase activity slowly   Complete by: As directed    Increase activity slowly   Complete by: As directed       Allergies as of 09/05/2020       Reactions   Aspirin Other (See Comments)   Triggers asthma        Medication List     STOP taking these medications    alendronate 70 MG tablet Commonly known as: FOSAMAX   CALCIUM 600-D PO   Flovent HFA 220 MCG/ACT inhaler Generic drug: fluticasone   metFORMIN 500 MG 24 hr tablet Commonly known as: GLUCOPHAGE-XR   montelukast 10 MG tablet Commonly known as: SINGULAIR   ondansetron 4 MG tablet Commonly known as: ZOFRAN   pravastatin 10 MG tablet Commonly known as: PRAVACHOL  TAKE these medications    albuterol 108 (90 Base) MCG/ACT inhaler Commonly known as: VENTOLIN HFA Inhale 2 puffs into the lungs every 4 (four) hours as needed for wheezing or shortness of breath.   busPIRone 5 MG tablet Commonly known as: BUSPAR Take 1 tablet (5 mg total) by mouth 3 (three) times daily.   donepezil 10 MG tablet Commonly known as:  ARICEPT Take 1 tablet (10 mg total) by mouth at bedtime.   escitalopram 10 MG tablet Commonly known as: LEXAPRO Take 1 tablet (10 mg total) by mouth every evening.   feeding supplement Liqd Take 237 mLs by mouth 2 (two) times daily between meals.   memantine 28 MG Cp24 24 hr capsule Commonly known as: NAMENDA XR Take 1 capsule (28 mg total) by mouth every morning.   sodium zirconium cyclosilicate 10 g Pack packet Commonly known as: LOKELMA Take 10 g by mouth 2 (two) times daily.       Allergies  Allergen Reactions   Aspirin Other (See Comments)    Triggers asthma      The results of significant diagnostics from this hospitalization (including imaging, microbiology, ancillary and laboratory) are listed below for reference.    Significant Diagnostic Studies: US RENAL  Result Date: 08/29/2020 CLINICAL DATA:  87 year old female with acute renal insufficiency. EXAM: RENAL / URINARY TRACT ULTRASOUND COMPLETE COMPARISON:  Right upper quadrant ultrasound dated 03/15/2011. FINDINGS: Right Kidney: Renal measurements: 7.4 x 4.0 x 4.7 cm = volume: 73 mL. There is diffuse increased renal parenchymal echogenicity. Mild parenchyma atrophy. No hydronephrosis or shadowing stone. Left Kidney: Renal measurements: 7.3 x 4.4 x 3.4 cm = volume: 57 mL. Increased renal parenchymal echogenicity and mild parenchyma atrophy. No hydronephrosis or shadowing stone. Bladder: Appears normal for degree of bladder distention. Other: Gallstones noted. IMPRESSION: 1. Mildly echogenic and atrophic kidneys in keeping with chronic kidney disease. No hydronephrosis or shadowing stone. 2. Cholelithiasis. Electronically Signed   By: Anner Crete M.D.   On: 08/29/2020 16:56   DG Chest Port 1 View  Result Date: 08/29/2020 CLINICAL DATA:  Weakness EXAM: PORTABLE CHEST 1 VIEW COMPARISON:  None. FINDINGS: The heart size and mediastinal contours are within normal limits. Left chest wall dual lead pacemaker. Both lungs are  clear. No pleural effusion. IMPRESSION: No acute process in the chest. Electronically Signed   By: Macy Mis M.D.   On: 08/29/2020 13:47    Microbiology: Recent Results (from the past 240 hour(s))  SARS CORONAVIRUS 2 (TAT 6-24 HRS) Nasopharyngeal Nasopharyngeal Swab     Status: None   Collection Time: 08/29/20  2:40 PM   Specimen: Nasopharyngeal Swab  Result Value Ref Range Status   SARS Coronavirus 2 NEGATIVE NEGATIVE Final    Comment: (NOTE) SARS-CoV-2 target nucleic acids are NOT DETECTED.  The SARS-CoV-2 RNA is generally detectable in upper and lower respiratory specimens during the acute phase of infection. Negative results do not preclude SARS-CoV-2 infection, do not rule out co-infections with other pathogens, and should not be used as the sole basis for treatment or other patient management decisions. Negative results must be combined with clinical observations, patient history, and epidemiological information. The expected result is Negative.  Fact Sheet for Patients: SugarRoll.be  Fact Sheet for Healthcare Providers: https://www.woods-mathews.com/  This test is not yet approved or cleared by the [place] FDA and  has been authorized for detection and/or diagnosis of SARS-CoV-2 by FDA under an Emergency Use Authorization (EUA). This EUA will remain  in effect (meaning  this test can be used) for the duration of the COVID-19 declaration under Se ction 564(b)(1) of the Act, 21 U.S.C. section 360bbb-3(b)(1), unless the authorization is terminated or revoked sooner.  Performed at [facility], [place] [address] [place],  [address]      Labs: Basic Metabolic Panel: Recent Labs  Lab 08/30/20 0116 08/31/20 0821 09/03/20 0159 09/03/20 0936 09/04/20 0142 09/05/20 0107  NA 137 136 138  --  137 137  K 4.9 4.8 5.4* 5.4* 5.3* 4.8  CL 110 110 110  --  109 109  CO2 18* 20* 19*  --  20* 23  GLUCOSE 68* 141*  114*  --  101* 119*  BUN 46* 32* 30*  --  31* 30*  CREATININE 2.26* 1.38* 1.29*  --  1.39* 1.37*  CALCIUM 9.5 8.7* 8.7*  --  8.7* 8.5*  MG  --   --  1.6*  --  2.0  --   PHOS  --   --  1.4*  --  4.5 3.5   Liver Function Tests: Recent Labs  Lab 08/29/20 1300 08/31/20 0821 09/05/20 0107  AST 15 16  --   ALT 6 9  --   ALKPHOS 66 55  --   BILITOT 0.5 0.6  --   PROT 6.8 5.4*  --   ALBUMIN 3.8 2.7* 2.5*   No results for input(s): LIPASE, AMYLASE in the last 168 hours. No results for input(s): AMMONIA in the last 168 hours. CBC: Recent Labs  Lab 08/29/20 1406 08/30/20 0116 08/31/20 0128 09/03/20 0159 09/04/20 0142  WBC 5.4 5.7 4.6 7.2 5.8  NEUTROABS 2.9 2.9 2.4  --   --   HGB 10.2* 10.4* 10.1* 9.1* 10.2*  HCT 33.8* 33.0* 31.9* 29.1* 32.8*  MCV 100.3* 96.5 97.0 97.7 98.8  PLT 219 221 100* 162 142*   Cardiac Enzymes: No results for input(s): CKTOTAL, CKMB, CKMBINDEX, TROPONINI in the last 168 hours. BNP: BNP (last 3 results) No results for input(s): BNP in the last 8760 hours.  ProBNP (last 3 results) No results for input(s): PROBNP in the last 8760 hours.  CBG: Recent Labs  Lab 09/03/20 1651 09/04/20 0656 09/04/20 1131 09/04/20 1659 09/05/20 0731  GLUCAP 154* 98 129* 180* 118*       Signed:  Nita Sells MD   [facility] 09/05/2020, 11:19 AM

## 2020-09-05 NOTE — TOC Progression Note (Signed)
Transition of Care [facility]) - Progression Note    Patient Details  Name: GENOVIA MOLDENHAUER MRN: [id number] Date of Birth: 1933/06/25  Transition of Care [facility]) CM/SW [place], [place] Phone Number: 09/05/2020, 2:34 PM  Clinical Narrative:     CSW received call from [facility] requesting information about pts prior level of functioning.   CSW called pts grand daughter [name]. [name] stated that pt was mostly in the bed. Pt was able to walk to bathroom with supervision. [name] would provide support for pt to stand. Pt could transfer from bed to chair with [name] providing support by holding arm. [name] bought all meals to pt and pt would feed herself. Pt needed assistance getting in and out of the shower but can stand long enough to wash her body. [name] would have everything already set up for her.   CSW gave information to [place] case worker. Case worker stated shewill have to give this information to the medical director and they may request a peer to peer.   CSW will let [facility] know that pt will not transfer over today.  TO will continue to follow.  Expected Discharge Plan: [place] Barriers to Discharge: Continued Medical Work up  Expected Discharge Plan and Services Expected Discharge Plan: [place]   Discharge Planning Services: CM Consult Post Acute Care Choice: [place] Living arrangements for the past 2 months: Single Family Home Expected Discharge Date: 09/05/20                                     Social Determinants of Health (SDOH) Interventions    Readmission Risk Interventions No flowsheet data found.  Emeterio Reeve, Latanya Presser, [place] Social Worker [phone]

## 2020-09-06 LAB — GLUCOSE, CAPILLARY
Glucose-Capillary: 96 mg/dL (ref 70–99)
Glucose-Capillary: 160 mg/dL — ABNORMAL HIGH (ref 70–99)

## 2020-09-06 NOTE — Progress Notes (Signed)
SNF declined D/c summary updated  Can go home with HH--orders in  Verneita Griffes, MD [facility] 12:26 PM

## 2020-09-06 NOTE — TOC Transition Note (Signed)
Transition of Care [facility]) - CM/SW Discharge Note   Patient Details  Name: [name] MRN: [id number] Date of Birth: May 06, 1933  Transition of Care [facility]) CM/SW Contact:  Emeterio Reeve, [place] Phone Number: 09/06/2020, 2:47 PM   Clinical Narrative:     Patient will DC to: Home  Anticipated DC date: 09/06/20 Family notified: Rozanna Boer daughter [name] Transport by: Corey Harold     Per MD patient ready for DC to  Home. Pts insurance Josem Kaufmann was denied for SNF. Ptts daughter is able to provide 24 hour supervision at home. NCM set up [facility] services.   CSW will sign off for now as social work intervention is no longer needed. Please consult [place] again if new needs arise.   Final next level of care: Home w Home Health Services Barriers to Discharge: Barriers Resolved   Patient Goals and CMS Choice Patient states their goals for this hospitalization and ongoing recovery are:: Daughter wants SNF CMS Medicare.gov Compare Post Acute Care list provided to:: Patient Represenative (must comment) Choice offered to / list presented to : Adult Children  Discharge Placement                Patient to be transferred to facility by: ptar Name of family member notified: Grand daughter [name] Patient and family notified of of transfer: 09/06/20  Discharge Plan and Services   Discharge Planning Services: CM Consult Post Acute Care Choice: [place]                               Social Determinants of Health (SDOH) Interventions     Readmission Risk Interventions No flowsheet data found.  Emeterio Reeve, Latanya Presser, [place] Social Worker [phone]

## 2020-09-06 NOTE — Care Management (Signed)
Patient was active with [facility] April 2022 they can take back.

## 2020-09-06 NOTE — Social Work (Signed)
[place] with [facility] is requesting a peer to peer due at 1pm. The number to call is [phone] option 5. Pts subscriber ID is [id number]. MD aware.   Emeterio Reeve, Latanya Presser, [place] Social Worker [phone]

## 2020-09-07 NOTE — Progress Notes (Signed)
Pt discharged to home per PTAR.

## 2020-09-09 DIAGNOSIS — N1832 Chronic kidney disease, stage 3b: Secondary | ICD-10-CM | POA: Diagnosis not present

## 2020-09-09 DIAGNOSIS — E1122 Type 2 diabetes mellitus with diabetic chronic kidney disease: Secondary | ICD-10-CM | POA: Diagnosis not present

## 2020-09-09 DIAGNOSIS — E43 Unspecified severe protein-calorie malnutrition: Secondary | ICD-10-CM | POA: Diagnosis not present

## 2020-09-09 DIAGNOSIS — Z8616 Personal history of COVID-19: Secondary | ICD-10-CM | POA: Diagnosis not present

## 2020-09-09 DIAGNOSIS — Z933 Colostomy status: Secondary | ICD-10-CM | POA: Diagnosis not present

## 2020-09-09 DIAGNOSIS — Z9181 History of falling: Secondary | ICD-10-CM | POA: Diagnosis not present

## 2020-09-09 DIAGNOSIS — I130 Hypertensive heart and chronic kidney disease with heart failure and stage 1 through stage 4 chronic kidney disease, or unspecified chronic kidney disease: Secondary | ICD-10-CM | POA: Diagnosis not present

## 2020-09-09 DIAGNOSIS — D631 Anemia in chronic kidney disease: Secondary | ICD-10-CM | POA: Diagnosis not present

## 2020-09-09 DIAGNOSIS — Z87891 Personal history of nicotine dependence: Secondary | ICD-10-CM | POA: Diagnosis not present

## 2020-09-09 DIAGNOSIS — Z950 Presence of cardiac pacemaker: Secondary | ICD-10-CM | POA: Diagnosis not present

## 2020-09-09 DIAGNOSIS — D508 Other iron deficiency anemias: Secondary | ICD-10-CM | POA: Diagnosis not present

## 2020-09-09 DIAGNOSIS — Z87440 Personal history of urinary (tract) infections: Secondary | ICD-10-CM | POA: Diagnosis not present

## 2020-09-09 DIAGNOSIS — M519 Unspecified thoracic, thoracolumbar and lumbosacral intervertebral disc disorder: Secondary | ICD-10-CM | POA: Diagnosis not present

## 2020-09-09 DIAGNOSIS — I5032 Chronic diastolic (congestive) heart failure: Secondary | ICD-10-CM | POA: Diagnosis not present

## 2020-09-09 DIAGNOSIS — J449 Chronic obstructive pulmonary disease, unspecified: Secondary | ICD-10-CM | POA: Diagnosis not present

## 2020-09-09 DIAGNOSIS — Z981 Arthrodesis status: Secondary | ICD-10-CM | POA: Diagnosis not present

## 2020-09-09 DIAGNOSIS — M48061 Spinal stenosis, lumbar region without neurogenic claudication: Secondary | ICD-10-CM | POA: Diagnosis not present

## 2020-09-09 DIAGNOSIS — E1140 Type 2 diabetes mellitus with diabetic neuropathy, unspecified: Secondary | ICD-10-CM | POA: Diagnosis not present

## 2020-09-10 DIAGNOSIS — E1140 Type 2 diabetes mellitus with diabetic neuropathy, unspecified: Secondary | ICD-10-CM | POA: Diagnosis not present

## 2020-09-10 DIAGNOSIS — Z9181 History of falling: Secondary | ICD-10-CM | POA: Diagnosis not present

## 2020-09-10 DIAGNOSIS — M48061 Spinal stenosis, lumbar region without neurogenic claudication: Secondary | ICD-10-CM | POA: Diagnosis not present

## 2020-09-10 DIAGNOSIS — Z87440 Personal history of urinary (tract) infections: Secondary | ICD-10-CM | POA: Diagnosis not present

## 2020-09-10 DIAGNOSIS — Z981 Arthrodesis status: Secondary | ICD-10-CM | POA: Diagnosis not present

## 2020-09-10 DIAGNOSIS — J449 Chronic obstructive pulmonary disease, unspecified: Secondary | ICD-10-CM | POA: Diagnosis not present

## 2020-09-10 DIAGNOSIS — I130 Hypertensive heart and chronic kidney disease with heart failure and stage 1 through stage 4 chronic kidney disease, or unspecified chronic kidney disease: Secondary | ICD-10-CM | POA: Diagnosis not present

## 2020-09-10 DIAGNOSIS — Z933 Colostomy status: Secondary | ICD-10-CM | POA: Diagnosis not present

## 2020-09-10 DIAGNOSIS — Z87891 Personal history of nicotine dependence: Secondary | ICD-10-CM | POA: Diagnosis not present

## 2020-09-10 DIAGNOSIS — D508 Other iron deficiency anemias: Secondary | ICD-10-CM | POA: Diagnosis not present

## 2020-09-10 DIAGNOSIS — Z8616 Personal history of COVID-19: Secondary | ICD-10-CM | POA: Diagnosis not present

## 2020-09-10 DIAGNOSIS — N1832 Chronic kidney disease, stage 3b: Secondary | ICD-10-CM | POA: Diagnosis not present

## 2020-09-10 DIAGNOSIS — I5032 Chronic diastolic (congestive) heart failure: Secondary | ICD-10-CM | POA: Diagnosis not present

## 2020-09-10 DIAGNOSIS — Z950 Presence of cardiac pacemaker: Secondary | ICD-10-CM | POA: Diagnosis not present

## 2020-09-10 DIAGNOSIS — E43 Unspecified severe protein-calorie malnutrition: Secondary | ICD-10-CM | POA: Diagnosis not present

## 2020-09-10 DIAGNOSIS — D631 Anemia in chronic kidney disease: Secondary | ICD-10-CM | POA: Diagnosis not present

## 2020-09-10 DIAGNOSIS — E1122 Type 2 diabetes mellitus with diabetic chronic kidney disease: Secondary | ICD-10-CM | POA: Diagnosis not present

## 2020-09-10 DIAGNOSIS — M519 Unspecified thoracic, thoracolumbar and lumbosacral intervertebral disc disorder: Secondary | ICD-10-CM | POA: Diagnosis not present

## 2020-09-11 DIAGNOSIS — Z933 Colostomy status: Secondary | ICD-10-CM | POA: Diagnosis not present

## 2020-09-11 DIAGNOSIS — K631 Perforation of intestine (nontraumatic): Secondary | ICD-10-CM | POA: Diagnosis not present

## 2020-09-16 ENCOUNTER — Encounter (HOSPITAL_COMMUNITY): Payer: Self-pay | Admitting: Emergency Medicine

## 2020-09-16 ENCOUNTER — Emergency Department (HOSPITAL_COMMUNITY)
Admission: EM | Admit: 2020-09-16 | Discharge: 2020-09-17 | Disposition: A | Discharge status: Home / Self Care | Attending: Emergency Medicine | Admitting: Emergency Medicine

## 2020-09-16 ENCOUNTER — Other Ambulatory Visit: Payer: Self-pay

## 2020-09-16 DIAGNOSIS — J449 Chronic obstructive pulmonary disease, unspecified: Secondary | ICD-10-CM | POA: Diagnosis not present

## 2020-09-16 DIAGNOSIS — N183 Chronic kidney disease, stage 3 unspecified: Secondary | ICD-10-CM | POA: Diagnosis not present

## 2020-09-16 DIAGNOSIS — I129 Hypertensive chronic kidney disease with stage 1 through stage 4 chronic kidney disease, or unspecified chronic kidney disease: Secondary | ICD-10-CM | POA: Insufficient documentation

## 2020-09-16 DIAGNOSIS — Z8616 Personal history of COVID-19: Secondary | ICD-10-CM | POA: Diagnosis not present

## 2020-09-16 DIAGNOSIS — E1140 Type 2 diabetes mellitus with diabetic neuropathy, unspecified: Secondary | ICD-10-CM | POA: Diagnosis not present

## 2020-09-16 DIAGNOSIS — Z79899 Other long term (current) drug therapy: Secondary | ICD-10-CM | POA: Diagnosis not present

## 2020-09-16 DIAGNOSIS — E1122 Type 2 diabetes mellitus with diabetic chronic kidney disease: Secondary | ICD-10-CM | POA: Insufficient documentation

## 2020-09-16 DIAGNOSIS — R6889 Other general symptoms and signs: Secondary | ICD-10-CM | POA: Diagnosis not present

## 2020-09-16 DIAGNOSIS — U071 COVID-19: Secondary | ICD-10-CM

## 2020-09-16 DIAGNOSIS — Z87891 Personal history of nicotine dependence: Secondary | ICD-10-CM | POA: Diagnosis not present

## 2020-09-16 DIAGNOSIS — R531 Weakness: Secondary | ICD-10-CM | POA: Diagnosis not present

## 2020-09-16 DIAGNOSIS — F0390 Unspecified dementia without behavioral disturbance: Secondary | ICD-10-CM | POA: Diagnosis not present

## 2020-09-16 DIAGNOSIS — J45909 Unspecified asthma, uncomplicated: Secondary | ICD-10-CM | POA: Insufficient documentation

## 2020-09-16 DIAGNOSIS — E860 Dehydration: Secondary | ICD-10-CM | POA: Diagnosis not present

## 2020-09-16 DIAGNOSIS — Z743 Need for continuous supervision: Secondary | ICD-10-CM | POA: Diagnosis not present

## 2020-09-16 DIAGNOSIS — R5381 Other malaise: Secondary | ICD-10-CM | POA: Diagnosis not present

## 2020-09-16 LAB — CBC WITH DIFFERENTIAL/PLATELET
Abs Immature Granulocytes: 0.03 10*3/uL (ref 0.00–0.07)
Basophils Absolute: 0 10*3/uL (ref 0.0–0.1)
Basophils Relative: 1 %
Eosinophils Absolute: 0.3 10*3/uL (ref 0.0–0.5)
Eosinophils Relative: 4 %
HCT: 34.7 % — ABNORMAL LOW (ref 36.0–46.0)
Hemoglobin: 10.5 g/dL — ABNORMAL LOW (ref 12.0–15.0)
Immature Granulocytes: 1 %
Lymphocytes Relative: 27 %
Lymphs Abs: 1.7 10*3/uL (ref 0.7–4.0)
MCH: 31 pg (ref 26.0–34.0)
MCHC: 30.3 g/dL (ref 30.0–36.0)
MCV: 102.4 fL — ABNORMAL HIGH (ref 80.0–100.0)
Monocytes Absolute: 0.4 10*3/uL (ref 0.1–1.0)
Monocytes Relative: 6 %
Neutro Abs: 3.8 10*3/uL (ref 1.7–7.7)
Neutrophils Relative %: 61 %
Platelets: 265 10*3/uL (ref 150–400)
RBC: 3.39 MIL/uL — ABNORMAL LOW (ref 3.87–5.11)
RDW: 13.1 % (ref 11.5–15.5)
WBC: 6.2 10*3/uL (ref 4.0–10.5)
nRBC: 0 % (ref 0.0–0.2)

## 2020-09-16 LAB — COMPREHENSIVE METABOLIC PANEL
ALT: 17 U/L (ref 0–44)
AST: 19 U/L (ref 15–41)
Albumin: 3.3 g/dL — ABNORMAL LOW (ref 3.5–5.0)
Alkaline Phosphatase: 68 U/L (ref 38–126)
Anion gap: 11 (ref 5–15)
BUN: 33 mg/dL — ABNORMAL HIGH (ref 8–23)
CO2: 20 mmol/L — ABNORMAL LOW (ref 22–32)
Calcium: 9.2 mg/dL (ref 8.9–10.3)
Chloride: 107 mmol/L (ref 98–111)
Creatinine, Ser: 1.77 mg/dL — ABNORMAL HIGH (ref 0.44–1.00)
GFR, Estimated: 28 mL/min — ABNORMAL LOW (ref >60)
Glucose, Bld: 125 mg/dL — ABNORMAL HIGH (ref 70–99)
Potassium: 4.9 mmol/L (ref 3.5–5.1)
Sodium: 138 mmol/L (ref 135–145)
Total Bilirubin: 0.5 mg/dL (ref 0.3–1.2)
Total Protein: 6.8 g/dL (ref 6.5–8.1)

## 2020-09-16 NOTE — ED Provider Notes (Signed)
Emergency Medicine Provider Triage Evaluation Note  [name] , a 87 y.o. female  was evaluated in triage.  Pt complains of dehydration and weakness.  Family called EMS with concern for poor appetite, reported that she has been more fatigued than usual and they are concerned she is dehydrated.  She had recent hospital admission for similar.  Is currently on hospice, and family called hospice and they recommended sending her to the emergency department.  Review of Systems  Positive: Generalized weakness, fatigue Negative: Fever, chest pain, shortness of breath, abdominal pain, vomiting  Physical Exam  BP 101/64 (BP Location: Left Arm)   Pulse 65   Temp 99.3 F (37.4 C) (Oral)   Resp 18   SpO2 100%  Gen:   Awake, no distress   Resp:  Normal effort  MSK:   Moves extremities without difficulty  Other:    Medical Decision Making  Medically screening exam initiated at 7:00 PM.  Appropriate orders placed.  Sky B Mccanless was informed that the remainder of the evaluation will be completed by another provider, this initial triage assessment does not replace that evaluation, and the importance of remaining in the ED until their evaluation is complete.     Jacqlyn Larsen, PA-C 09/16/20 [name]    Sandy Bo, MD 09/16/20 [phone]

## 2020-09-16 NOTE — ED Triage Notes (Signed)
Pt BIB [facility], hx dementia, family reports pt has had decreased oral intake and urine output. Pt c/o generalized fatigue.

## 2020-09-17 ENCOUNTER — Encounter (HOSPITAL_COMMUNITY): Payer: Self-pay | Admitting: Emergency Medicine

## 2020-09-17 DIAGNOSIS — R6889 Other general symptoms and signs: Secondary | ICD-10-CM | POA: Diagnosis not present

## 2020-09-17 DIAGNOSIS — U071 COVID-19: Secondary | ICD-10-CM | POA: Diagnosis not present

## 2020-09-17 DIAGNOSIS — Z7401 Bed confinement status: Secondary | ICD-10-CM | POA: Diagnosis not present

## 2020-09-17 DIAGNOSIS — R531 Weakness: Secondary | ICD-10-CM | POA: Diagnosis not present

## 2020-09-17 DIAGNOSIS — Z743 Need for continuous supervision: Secondary | ICD-10-CM | POA: Diagnosis not present

## 2020-09-17 MED ORDER — MOLNUPIRAVIR EUA 200MG CAPSULE: 4.0000 | ORAL_CAPSULE | Freq: Two times a day (BID) | ORAL | 0 refills | Status: AC

## 2020-09-17 NOTE — ED Notes (Signed)
Called [facility] for patient to transported home. Alerted of Covid status

## 2020-09-17 NOTE — ED Provider Notes (Signed)
[facility] EMERGENCY DEPARTMENT Provider Note   CSN: [id number] Arrival date & time: 09/16/20  1859     History Chief Complaint  Patient presents with   Weakness    [name] is a 87 y.o. female.  The history is provided by medical records. The history is limited by the condition of the patient (dementia).  Weakness Severity:  Mild Onset quality:  Gradual Duration: days. Timing:  Constant Progression:  Unchanged Chronicity:  New Context: not urinary tract infection   Relieved by:  Nothing Worsened by:  Nothing Ineffective treatments:  None tried Associated symptoms: no drooling, no fever and no vomiting   Risk factors: no heart disease   Risk factors comment:  On hospice     Past Medical History:  Diagnosis Date   Asthma    CKD (chronic kidney disease) stage 3, GFR 30-59 ml/min ([place]) 05/01/2018   Colostomy present ([place])    hx/notes 07/17/2010   Dementia ([place])    Diabetic neuropathy ([place])    Hypertension    hx/notes 07/17/2010   Pneumonia    recent/notes 10/08/2016   Symptomatic bradycardia 12/15/2017   Type II diabetes mellitus ([place])    hx/notes 07/17/2010    Patient Active Problem List   Diagnosis Date Noted   Malnutrition of moderate degree 08/30/2020   Protein-calorie malnutrition, severe 08/30/2020   Acute kidney injury superimposed on chronic kidney disease ([place]) 08/29/2020   Dehydration 08/29/2020   Hyperkalemia 08/29/2020   Macrocytic anemia 08/29/2020   COVID-19 virus infection 04/07/2020   Pacemaker 12/21/2019   UTI (urinary tract infection) 05/11/2019   ARF (acute renal failure) ([place]) 05/10/2019   Acute lower UTI 05/10/2019   Symptomatic bradycardia 05/01/2018   Near syncope 05/01/2018   CKD (chronic kidney disease) stage 3, GFR 30-59 ml/min 05/01/2018   Influenza B 04/10/2018   Generalized weakness 04/10/2018   Essential hypertension 03/01/2018   COPD (chronic obstructive pulmonary disease) ([place]) 03/01/2018   Bradycardia  12/15/2017   AKI (acute kidney injury) ([place]) 07/18/2017   Colostomy present ([place]) 11/08/2016   Syncope 10/08/2016   Acute kidney injury ([place]) 10/08/2016   Abnormal chest x-ray 10/08/2016   Lethargy 10/08/2016   Dementia ([place])    Sepsis ([place]) 09/05/2016   Community acquired pneumonia 09/05/2016   Acute encephalopathy 09/05/2016   Diabetes mellitus with complication ([place]) [id number]   Nausea & vomiting 04/09/2012   Gallstones 03/15/2011   Partial SBO 03/15/2011   Colostomy hernia ([place]) 03/15/2011   Hypercalcemia 02/28/2007   IRON DEFIC ANEMIA [place] DIET IRON INTAKE 02/28/2007   DISCITIS 02/28/2007   OSTEOMYELITIS 02/28/2007   CHILLS WITHOUT FEVER 02/28/2007    Past Surgical History:  Procedure Laterality Date   ABDOMINAL HYSTERECTOMY     COLOSTOMY     S/P SBO hx/notes 07/17/2010   LUMBAR FUSION  11/2006   hx/notes 07/17/2010   NASAL SINUS SURGERY  04/28/2002   Bilateral inferior turbinate reductions, bilateral maxillary antrotomies, bilateral total ethmoidectomies, bilateral frontal recess explorations, bilateral sphenoidotomies, Instatrac guidance./notes 07/28/2010   PACEMAKER IMPLANT N/A 05/02/2018   Procedure: PACEMAKER IMPLANT;  Surgeon: Evans Lance, MD;  Location: [facility];  Service: Cardiovascular;  Laterality: N/A;     OB History   No obstetric history on file.     Family History  Problem Relation Age of Onset   Kidney disease Mother    Hypertension Mother    Other Father        gangrene with amputation   Diabetes  Sister    Cancer Brother    Heart disease Brother    Schizophrenia Daughter    Diabetes Daughter    Stroke Daughter    Diabetes Sister    Breast cancer Neg Hx     Social History   Tobacco Use   Smoking status: Never   Smokeless tobacco: Former    Types: Chew    Quit date: 02/27/2017  Vaping Use   Vaping Use: Never used  Substance Use Topics   Alcohol use: No   Drug use: No    Home Medications Prior to Admission medications    Medication Sig Start Date End Date Taking? Authorizing Provider  molnupiravir EUA 200 mg CAPS Take 4 capsules (800 mg total) by mouth 2 (two) times daily for 5 days. 09/17/20 09/22/20 Yes Aaliyah Gavel, MD  albuterol (VENTOLIN HFA) 108 (90 Base) MCG/ACT inhaler Inhale 2 puffs into the lungs every 4 (four) hours as needed for wheezing or shortness of breath. 04/29/20   Medina-Vargas, Monina C, NP  busPIRone (BUSPAR) 5 MG tablet Take 1 tablet (5 mg total) by mouth 3 (three) times daily. 04/29/20   Medina-Vargas, Monina C, NP  donepezil (ARICEPT) 10 MG tablet Take 1 tablet (10 mg total) by mouth at bedtime. 09/05/20   Nita Sells, MD  escitalopram (LEXAPRO) 10 MG tablet Take 1 tablet (10 mg total) by mouth every evening. 09/05/20   Nita Sells, MD  feeding supplement (ENSURE ENLIVE / ENSURE PLUS) LIQD Take 237 mLs by mouth 2 (two) times daily between meals. 04/11/20   Pokhrel, Corrie Mckusick, MD  memantine (NAMENDA XR) 28 MG CP24 24 hr capsule Take 1 capsule (28 mg total) by mouth every morning. 09/05/20   Nita Sells, MD  sodium zirconium cyclosilicate (LOKELMA) 10 g PACK packet Take 10 g by mouth 2 (two) times daily. 09/05/20   Nita Sells, MD    Allergies    Aspirin  Review of Systems   Review of Systems  Unable to perform ROS: Dementia  Constitutional:  Negative for fever.  HENT:  Negative for drooling and facial swelling.   Eyes:  Negative for redness.  Respiratory:  Negative for wheezing and stridor.   Cardiovascular:  Negative for leg swelling.  Gastrointestinal:  Negative for vomiting.  Neurological:  Positive for weakness.   Physical Exam Updated Vital Signs BP 133/67 (BP Location: Left Arm)   Pulse 63   Temp 99.3 F (37.4 C) (Oral)   Resp 18   SpO2 98%   Physical Exam Vitals and nursing note reviewed.  Constitutional:      General: She is not in acute distress.    Appearance: Normal appearance.  HENT:     Head: Normocephalic and atraumatic.      Nose: Nose normal.  Eyes:     Conjunctiva/sclera: Conjunctivae normal.     Pupils: Pupils are equal, round, and reactive to light.  Cardiovascular:     Rate and Rhythm: Normal rate and regular rhythm.     Pulses: Normal pulses.     Heart sounds: Normal heart sounds.  Pulmonary:     Effort: Pulmonary effort is normal.     Breath sounds: Normal breath sounds.  Abdominal:     General: Abdomen is flat. Bowel sounds are normal.     Palpations: Abdomen is soft.     Tenderness: There is no abdominal tenderness. There is no guarding.  Musculoskeletal:        General: Normal range of motion.     Cervical  back: Normal range of motion and neck supple.  Skin:    General: Skin is warm and dry.     Capillary Refill: Capillary refill takes less than 2 seconds.  Neurological:     General: No focal deficit present.     Mental Status: She is alert.     Deep Tendon Reflexes: Reflexes normal.  Psychiatric:        Mood and Affect: Mood normal.        Behavior: Behavior normal.    ED Results / Procedures / Treatments   Labs (all labs ordered are listed, but only abnormal results are displayed) Results for orders placed or performed during the hospital encounter of 09/16/20  Comprehensive metabolic panel  Result Value Ref Range   Sodium 138 135 - 145 mmol/L   Potassium 4.9 3.5 - 5.1 mmol/L   Chloride 107 98 - 111 mmol/L   CO2 20 (L) 22 - 32 mmol/L   Glucose, Bld 125 (H) 70 - 99 mg/dL   BUN 33 (H) 8 - 23 mg/dL   Creatinine, Ser 1.77 (H) 0.44 - 1.00 mg/dL   Calcium 9.2 8.9 - 10.3 mg/dL   Total Protein 6.8 6.5 - 8.1 g/dL   Albumin 3.3 (L) 3.5 - 5.0 g/dL   AST 19 15 - 41 U/L   ALT 17 0 - 44 U/L   Alkaline Phosphatase 68 38 - 126 U/L   Total Bilirubin 0.5 0.3 - 1.2 mg/dL   GFR, Estimated 28 (L) >60 mL/min   Anion gap 11 5 - 15  CBC with Differential  Result Value Ref Range   WBC 6.2 4.0 - 10.5 K/uL   RBC 3.39 (L) 3.87 - 5.11 MIL/uL   Hemoglobin 10.5 (L) 12.0 - 15.0 g/dL   HCT 34.7 (L)  36.0 - 46.0 %   MCV 102.4 (H) 80.0 - 100.0 fL   MCH 31.0 26.0 - 34.0 pg   MCHC 30.3 30.0 - 36.0 g/dL   RDW 13.1 11.5 - 15.5 %   Platelets 265 150 - 400 K/uL   nRBC 0.0 0.0 - 0.2 %   Neutrophils Relative % 61 %   Neutro Abs 3.8 1.7 - 7.7 K/uL   Lymphocytes Relative 27 %   Lymphs Abs 1.7 0.7 - 4.0 K/uL   Monocytes Relative 6 %   Monocytes Absolute 0.4 0.1 - 1.0 K/uL   Eosinophils Relative 4 %   Eosinophils Absolute 0.3 0.0 - 0.5 K/uL   Basophils Relative 1 %   Basophils Absolute 0.0 0.0 - 0.1 K/uL   Immature Granulocytes 1 %   Abs Immature Granulocytes 0.03 0.00 - 0.07 K/uL   US RENAL  Result Date: 08/29/2020 CLINICAL DATA:  87 year old female with acute renal insufficiency. EXAM: RENAL / URINARY TRACT ULTRASOUND COMPLETE COMPARISON:  Right upper quadrant ultrasound dated 03/15/2011. FINDINGS: Right Kidney: Renal measurements: 7.4 x 4.0 x 4.7 cm = volume: 73 mL. There is diffuse increased renal parenchymal echogenicity. Mild parenchyma atrophy. No hydronephrosis or shadowing stone. Left Kidney: Renal measurements: 7.3 x 4.4 x 3.4 cm = volume: 57 mL. Increased renal parenchymal echogenicity and mild parenchyma atrophy. No hydronephrosis or shadowing stone. Bladder: Appears normal for degree of bladder distention. Other: Gallstones noted. IMPRESSION: 1. Mildly echogenic and atrophic kidneys in keeping with chronic kidney disease. No hydronephrosis or shadowing stone. 2. Cholelithiasis. Electronically Signed   By: Anner Crete M.D.   On: 08/29/2020 16:56   DG Chest Port 1 View  Result Date: 08/29/2020 CLINICAL DATA:  Weakness EXAM: PORTABLE CHEST 1 VIEW COMPARISON:  None. FINDINGS: The heart size and mediastinal contours are within normal limits. Left chest wall dual lead pacemaker. Both lungs are clear. No pleural effusion. IMPRESSION: No acute process in the chest. Electronically Signed   By: Macy Mis M.D.   On: 08/29/2020 13:47     EKG EKG Interpretation  Date/Time:  Monday  September 16 2020 19:13:20 EDT Ventricular Rate:  69 PR Interval:  178 QRS Duration: 86 QT Interval:  396 QTC Calculation: 424 R Axis:   -49 Text Interpretation: Normal sinus rhythm with sinus arrhythmia Left axis deviation Possible Anterior infarct , age undetermined Abnormal ECG since last tracing no significant change Confirmed by Daleen Bo [phone]) on 09/16/2020 11:36:37 PM  Radiology No results found.  Procedures Procedures   Medications Ordered in ED Medications - No data to display  ED Course  I have reviewed the triage vital signs and the nursing notes.  Pertinent labs & imaging results that were available during my care of the patient were reviewed by me and considered in my medical decision making (see chart for details).    Patient is well appearing with norma lvitals and exam.  Will start molnupavir as she has covid and appears unvaccinated. Patient is on hospice.  Call hospice for further guidance.  [name] was evaluated in Emergency Department on 09/17/2020 for the symptoms described in the history of present illness. She was evaluated in the context of the global COVID-19 pandemic, which necessitated consideration that the patient might be at risk for infection with the SARS-CoV-2 virus that causes COVID-19. Institutional protocols and algorithms that pertain to the evaluation of patients at risk for COVID-19 are in a state of rapid change based on information released by regulatory bodies including the CDC and federal and state organizations. These policies and algorithms were followed during the patient's care in the ED.   Final Clinical Impression(s) / ED Diagnoses Final diagnoses:  None   Return for intractable cough, coughing up blood, fevers > 100.4 unrelieved by medication, shortness of breath, intractable vomiting, chest pain, shortness of breath, weakness, numbness, changes in speech, facial asymmetry, abdominal pain, passing out, Inability to tolerate  liquids or food, cough, altered mental status or any concerns. No signs of systemic illness or infection. The patient is nontoxic-appearing on exam and vital signs are within normal limits. I have reviewed the triage vital signs and the nursing notes. Pertinent labs & imaging results that were available during my care of the patient were reviewed by me and considered in my medical decision making (see chart for details). After history, exam, and medical workup I feel the patient has been appropriately medically screened and is safe for discharge home. Pertinent diagnoses were discussed with the patient. Patient was given return precautions.  Rx / DC Orders ED Discharge Orders          Ordered    molnupiravir EUA 200 mg CAPS  2 times daily        09/17/20 0100             Sya Nestler, MD 09/17/20 [id number]

## 2020-09-18 ENCOUNTER — Telehealth: Payer: Self-pay

## 2020-09-18 NOTE — Telephone Encounter (Signed)
Spoke with patient's granddaughter [name] and scheduled an in-person Palliative Consult for 10/17/20 @ 12:30PM  COVID screening was positive. Diagnosed on 09/16/20. No pets in home. Patient lives with granddaughter and great grandchildren.  Consent obtained; updated Outlook/Netsmart/Team List and Epic.   Family is aware they may be receiving a call from NP the day before or day of to confirm appointment.

## 2020-10-17 ENCOUNTER — Other Ambulatory Visit: Payer: Self-pay

## 2020-10-17 ENCOUNTER — Other Ambulatory Visit: Payer: Medicare Other | Admitting: Hospice

## 2020-11-01 DIAGNOSIS — K631 Perforation of intestine (nontraumatic): Secondary | ICD-10-CM | POA: Diagnosis not present

## 2020-11-01 DIAGNOSIS — Z933 Colostomy status: Secondary | ICD-10-CM | POA: Diagnosis not present

## 2020-11-07 NOTE — Progress Notes (Deleted)
Cardiology Office Note   Date:  11/07/2020   ID:  [name], DOB 01-02-1934, MRN [id number]  PCP:  Leeroy Cha, MD  Cardiologist:  Dr.[place]  No chief complaint on file.    History of Present Illness: [name] is a 87 y.o. female who presents for ongoing assessment and management of hypertension, bradycardia, and syncope.  She is status post permanent pacemaker implant on 05/02/2018 by Dr. Lovena Le.  Other history includes dementia, diabetes, small bowel obstruction/rupture.  She was last seen virtually by Coletta Memos, nurse practitioner via telemedicine.  She had recently been discharged from the hospital on 06/08/2019 because she had not been eating or drinking at home.  She was found to have dehydration and a UTI and treated.  She was recently seen again in the emergency room on 09/17/2020 in the setting of weakness.  It was noted that the patient is on hospice.  It was also noted that she was unvaccinated for COVID.Marland Kitchen  Past Medical History:  Diagnosis Date   Asthma    CKD (chronic kidney disease) stage 3, GFR 30-59 ml/min ([facility]) 05/01/2018   Colostomy present ([place])    hx/notes 07/17/2010   Dementia ([place])    Diabetic neuropathy ([place])    Hypertension    hx/notes 07/17/2010   Pneumonia    recent/notes 10/08/2016   Symptomatic bradycardia 12/15/2017   Type II diabetes mellitus ([place])    hx/notes 07/17/2010    Past Surgical History:  Procedure Laterality Date   ABDOMINAL HYSTERECTOMY     COLOSTOMY     S/P SBO hx/notes 07/17/2010   LUMBAR FUSION  11/2006   hx/notes 07/17/2010   NASAL SINUS SURGERY  04/28/2002   Bilateral inferior turbinate reductions, bilateral maxillary antrotomies, bilateral total ethmoidectomies, bilateral frontal recess explorations, bilateral sphenoidotomies, Instatrac guidance./notes 07/28/2010   PACEMAKER IMPLANT N/A 05/02/2018   Procedure: PACEMAKER IMPLANT;  Surgeon: Evans Lance, MD;  Location: [facility];  Service: Cardiovascular;   Laterality: N/A;     Current Outpatient Medications  Medication Sig Dispense Refill   albuterol (VENTOLIN HFA) 108 (90 Base) MCG/ACT inhaler Inhale 2 puffs into the lungs every 4 (four) hours as needed for wheezing or shortness of breath. 18 g 0   busPIRone (BUSPAR) 5 MG tablet Take 1 tablet (5 mg total) by mouth 3 (three) times daily. 90 tablet 0   donepezil (ARICEPT) 10 MG tablet Take 1 tablet (10 mg total) by mouth at bedtime. 5 tablet 0   escitalopram (LEXAPRO) 10 MG tablet Take 1 tablet (10 mg total) by mouth every evening. 4 tablet 0   feeding supplement (ENSURE ENLIVE / ENSURE PLUS) LIQD Take 237 mLs by mouth 2 (two) times daily between meals.     memantine (NAMENDA XR) 28 MG CP24 24 hr capsule Take 1 capsule (28 mg total) by mouth every morning. 5 capsule 0   sodium zirconium cyclosilicate (LOKELMA) 10 g PACK packet Take 10 g by mouth 2 (two) times daily.     No current facility-administered medications for this visit.    Allergies:   Aspirin    Social History:  The patient  reports that she has never smoked. She quit smokeless tobacco use about 3 years ago.  Her smokeless tobacco use included chew. She reports that she does not drink alcohol and does not use drugs.   Family History:  The patient's family history includes Cancer in her brother; Diabetes in her daughter, sister, and sister; Heart disease in her brother;  Hypertension in her mother; Kidney disease in her mother; Other in her father; Schizophrenia in her daughter; Stroke in her daughter.    ROS: All other systems are reviewed and negative. Unless otherwise mentioned in H&P    PHYSICAL EXAM: VS:  There were no vitals taken for this visit. , BMI There is no height or weight on file to calculate BMI. GEN: Well nourished, well developed, in no acute distress HEENT: normal Neck: no JVD, carotid bruits, or masses Cardiac: ***RRR; no murmurs, rubs, or gallops,no edema  Respiratory:  Clear to auscultation bilaterally,  normal work of breathing GI: soft, nontender, nondistended, + BS MS: no deformity or atrophy Skin: warm and dry, no rash Neuro:  Strength and sensation are intact Psych: euthymic mood, full affect   EKG:  EKG {ACTION; IS/IS [id number] ordered today. The ekg ordered today demonstrates ***   Recent Labs: 04/07/2020: TSH 2.556 09/04/2020: Magnesium 2.0 09/16/2020: ALT 17; BUN 33; Creatinine, Ser 1.77; Hemoglobin 10.5; Platelets 265; Potassium 4.9; Sodium 138    Lipid Panel No results found for: CHOL, TRIG, HDL, CHOLHDL, VLDL, LDLCALC, LDLDIRECT    Wt Readings from Last 3 Encounters:  09/03/20 120 lb 1.6 oz (54.5 kg)  04/26/20 127 lb 9.6 oz (57.9 kg)  04/19/20 123 lb 6.4 oz (56 kg)      Other studies Reviewed: Echocardiogram 12/29/2017 Left ventricle: The cavity size was normal. Systolic function was    vigorous. The estimated ejection fraction was in the range of 65%    to 70%. Wall motion was normal; there were no regional wall    motion abnormalities. Features are consistent with a pseudonormal    left ventricular filling pattern, with concomitant abnormal    relaxation and increased filling pressure (grade 2 diastolic    dysfunction). Doppler parameters are consistent with high    ventricular filling pressure.  - Ascending aorta: The ascending aorta was mildly dilated.  - Mitral valve: There was mild regurgitation.  - Tricuspid valve: There was mild regurgitation.  - Pulmonic valve: There was mild regurgitation.  - Pulmonary arteries: PA peak pressure: 42 mm Hg (S).   ASSESSMENT AND PLAN:  1.  ***   Current medicines are reviewed at length with the patient today.  I have spent *** dedicated to the care of this patient on the date of this encounter to include pre-visit review of records, assessment, management and diagnostic testing,with shared decision making.  Labs/ tests ordered today include: *** Phill Myron. West Pugh, ANP, AACC   11/07/2020 4:00 PM    [facility]  [facility] [address] Office [phone] Fax [phone]  Notice: This dictation was prepared with Dragon dictation along with smaller phrase technology. Any transcriptional errors that result from this process are unintentional and may not be corrected upon review.

## 2020-11-08 ENCOUNTER — Ambulatory Visit: Payer: Medicare Other | Admitting: Adult Health

## 2020-12-02 DIAGNOSIS — Z933 Colostomy status: Secondary | ICD-10-CM | POA: Diagnosis not present

## 2020-12-02 DIAGNOSIS — K631 Perforation of intestine (nontraumatic): Secondary | ICD-10-CM | POA: Diagnosis not present

## 2020-12-09 ENCOUNTER — Other Ambulatory Visit: Payer: Self-pay

## 2020-12-09 ENCOUNTER — Emergency Department (HOSPITAL_COMMUNITY)
Admission: EM | Admit: 2020-12-09 | Discharge: 2020-12-10 | Disposition: A | Discharge status: Home / Self Care | Payer: Medicare Other | Attending: Emergency Medicine | Admitting: Emergency Medicine

## 2020-12-09 ENCOUNTER — Emergency Department (HOSPITAL_COMMUNITY): Payer: Medicare Other

## 2020-12-09 DIAGNOSIS — N179 Acute kidney failure, unspecified: Secondary | ICD-10-CM

## 2020-12-09 DIAGNOSIS — J45909 Unspecified asthma, uncomplicated: Secondary | ICD-10-CM | POA: Insufficient documentation

## 2020-12-09 DIAGNOSIS — R531 Weakness: Secondary | ICD-10-CM | POA: Diagnosis not present

## 2020-12-09 DIAGNOSIS — Z743 Need for continuous supervision: Secondary | ICD-10-CM | POA: Diagnosis not present

## 2020-12-09 DIAGNOSIS — K8020 Calculus of gallbladder without cholecystitis without obstruction: Secondary | ICD-10-CM | POA: Diagnosis not present

## 2020-12-09 DIAGNOSIS — N183 Chronic kidney disease, stage 3 unspecified: Secondary | ICD-10-CM | POA: Diagnosis not present

## 2020-12-09 DIAGNOSIS — E1122 Type 2 diabetes mellitus with diabetic chronic kidney disease: Secondary | ICD-10-CM | POA: Diagnosis not present

## 2020-12-09 DIAGNOSIS — F0390 Unspecified dementia without behavioral disturbance: Secondary | ICD-10-CM | POA: Diagnosis not present

## 2020-12-09 DIAGNOSIS — Z87891 Personal history of nicotine dependence: Secondary | ICD-10-CM | POA: Diagnosis not present

## 2020-12-09 DIAGNOSIS — R112 Nausea with vomiting, unspecified: Secondary | ICD-10-CM | POA: Diagnosis not present

## 2020-12-09 DIAGNOSIS — Z20822 Contact with and (suspected) exposure to covid-19: Secondary | ICD-10-CM | POA: Diagnosis not present

## 2020-12-09 DIAGNOSIS — E1140 Type 2 diabetes mellitus with diabetic neuropathy, unspecified: Secondary | ICD-10-CM | POA: Insufficient documentation

## 2020-12-09 DIAGNOSIS — R001 Bradycardia, unspecified: Secondary | ICD-10-CM | POA: Diagnosis not present

## 2020-12-09 DIAGNOSIS — R5383 Other fatigue: Secondary | ICD-10-CM | POA: Insufficient documentation

## 2020-12-09 DIAGNOSIS — J449 Chronic obstructive pulmonary disease, unspecified: Secondary | ICD-10-CM | POA: Diagnosis not present

## 2020-12-09 DIAGNOSIS — Z950 Presence of cardiac pacemaker: Secondary | ICD-10-CM | POA: Insufficient documentation

## 2020-12-09 DIAGNOSIS — Z8616 Personal history of COVID-19: Secondary | ICD-10-CM | POA: Insufficient documentation

## 2020-12-09 DIAGNOSIS — N2889 Other specified disorders of kidney and ureter: Secondary | ICD-10-CM | POA: Diagnosis not present

## 2020-12-09 DIAGNOSIS — I129 Hypertensive chronic kidney disease with stage 1 through stage 4 chronic kidney disease, or unspecified chronic kidney disease: Secondary | ICD-10-CM | POA: Diagnosis not present

## 2020-12-09 DIAGNOSIS — R109 Unspecified abdominal pain: Secondary | ICD-10-CM | POA: Diagnosis not present

## 2020-12-09 DIAGNOSIS — R0902 Hypoxemia: Secondary | ICD-10-CM | POA: Diagnosis not present

## 2020-12-09 DIAGNOSIS — M160 Bilateral primary osteoarthritis of hip: Secondary | ICD-10-CM | POA: Diagnosis not present

## 2020-12-09 LAB — CBC WITH DIFFERENTIAL/PLATELET
Abs Immature Granulocytes: 0.01 10*3/uL (ref 0.00–0.07)
Basophils Absolute: 0.1 10*3/uL (ref 0.0–0.1)
Basophils Relative: 1 %
Eosinophils Absolute: 0.3 10*3/uL (ref 0.0–0.5)
Eosinophils Relative: 4 %
HCT: 38.9 % (ref 36.0–46.0)
Hemoglobin: 11.7 g/dL — ABNORMAL LOW (ref 12.0–15.0)
Immature Granulocytes: 0 %
Lymphocytes Relative: 16 %
Lymphs Abs: 1.1 10*3/uL (ref 0.7–4.0)
MCH: 30.5 pg (ref 26.0–34.0)
MCHC: 30.1 g/dL (ref 30.0–36.0)
MCV: 101.3 fL — ABNORMAL HIGH (ref 80.0–100.0)
Monocytes Absolute: 0.4 10*3/uL (ref 0.1–1.0)
Monocytes Relative: 6 %
Neutro Abs: 5 10*3/uL (ref 1.7–7.7)
Neutrophils Relative %: 73 %
Platelets: 195 10*3/uL (ref 150–400)
RBC: 3.84 MIL/uL — ABNORMAL LOW (ref 3.87–5.11)
RDW: 13 % (ref 11.5–15.5)
WBC: 6.8 10*3/uL (ref 4.0–10.5)
nRBC: 0 % (ref 0.0–0.2)

## 2020-12-09 LAB — COMPREHENSIVE METABOLIC PANEL
ALT: 15 U/L (ref 0–44)
AST: 26 U/L (ref 15–41)
Albumin: 3.8 g/dL (ref 3.5–5.0)
Alkaline Phosphatase: 76 U/L (ref 38–126)
Anion gap: 9 (ref 5–15)
BUN: 52 mg/dL — ABNORMAL HIGH (ref 8–23)
CO2: 22 mmol/L (ref 22–32)
Calcium: 9.5 mg/dL (ref 8.9–10.3)
Chloride: 114 mmol/L — ABNORMAL HIGH (ref 98–111)
Creatinine, Ser: 2.52 mg/dL — ABNORMAL HIGH (ref 0.44–1.00)
GFR, Estimated: 18 mL/min — ABNORMAL LOW (ref >60)
Glucose, Bld: 131 mg/dL — ABNORMAL HIGH (ref 70–99)
Potassium: 5.3 mmol/L — ABNORMAL HIGH (ref 3.5–5.1)
Sodium: 145 mmol/L (ref 135–145)
Total Bilirubin: 0.5 mg/dL (ref 0.3–1.2)
Total Protein: 7.6 g/dL (ref 6.5–8.1)

## 2020-12-09 NOTE — ED Triage Notes (Signed)
Pt BIB EMS from home, pt complains of fatigue and not feeling well.   Vitals  120/82 67 hr  18 r 97% room air  247 cbg

## 2020-12-09 NOTE — ED Provider Notes (Signed)
[facility] DEPT Provider Note   CSN: [id number] Arrival date & time: 12/09/20  2025     History Chief Complaint  Patient presents with   Fatigue    [name] is a 87 y.o. female.  87 year old female that presents emergency department today via EMS for fatigue.  Patient states that she feels fine she is unsure why she is here.  I ended up calling the granddaughter who lives with her she states that she vomited multiple times earlier today and that the reason why she called EMS.  No other issues as far she knows.  She states she has not had any fever, cough, shortness of breath, diarrhea or constipation.  The granddaughter states that she had an episode of diarrhea herself a couple days ago but not sure if that could be related.  No other sick contacts.       Past Medical History:  Diagnosis Date   Asthma    CKD (chronic kidney disease) stage 3, GFR 30-59 ml/min (HCC) 05/01/2018   Colostomy present ([place])    hx/notes 07/17/2010   Dementia ([place])    Diabetic neuropathy ([place])    Hypertension    hx/notes 07/17/2010   Pneumonia    recent/notes 10/08/2016   Symptomatic bradycardia 12/15/2017   Type II diabetes mellitus ([place])    hx/notes 07/17/2010    Patient Active Problem List   Diagnosis Date Noted   Malnutrition of moderate degree 08/30/2020   Protein-calorie malnutrition, severe 08/30/2020   Acute kidney injury superimposed on chronic kidney disease ([place]) 08/29/2020   Dehydration 08/29/2020   Hyperkalemia 08/29/2020   Macrocytic anemia 08/29/2020   COVID-19 virus infection 04/07/2020   Pacemaker 12/21/2019   UTI (urinary tract infection) 05/11/2019   ARF (acute renal failure) ([place]) 05/10/2019   Acute lower UTI 05/10/2019   Symptomatic bradycardia 05/01/2018   Near syncope 05/01/2018   CKD (chronic kidney disease) stage 3, GFR 30-59 ml/min 05/01/2018   Influenza B 04/10/2018   Generalized weakness 04/10/2018   Essential hypertension  03/01/2018   COPD (chronic obstructive pulmonary disease) ([place]) 03/01/2018   Bradycardia 12/15/2017   AKI (acute kidney injury) ([place]) 07/18/2017   Colostomy present ([place]) 11/08/2016   Syncope 10/08/2016   Acute kidney injury ([place]) 10/08/2016   Abnormal chest x-ray 10/08/2016   Lethargy 10/08/2016   Dementia ([place])    Sepsis ([place]) 09/05/2016   Community acquired pneumonia 09/05/2016   Acute encephalopathy 09/05/2016   Diabetes mellitus with complication ([employer]) [id number]   Nausea & vomiting 04/09/2012   Gallstones 03/15/2011   Partial SBO 03/15/2011   Colostomy hernia ([place]) 03/15/2011   Hypercalcemia 02/28/2007   IRON DEFIC ANEMIA [place] DIET IRON INTAKE 02/28/2007   DISCITIS 02/28/2007   OSTEOMYELITIS 02/28/2007   CHILLS WITHOUT FEVER 02/28/2007    Past Surgical History:  Procedure Laterality Date   ABDOMINAL HYSTERECTOMY     COLOSTOMY     S/P SBO hx/notes 07/17/2010   LUMBAR FUSION  11/2006   hx/notes 07/17/2010   NASAL SINUS SURGERY  04/28/2002   Bilateral inferior turbinate reductions, bilateral maxillary antrotomies, bilateral total ethmoidectomies, bilateral frontal recess explorations, bilateral sphenoidotomies, [employer] guidance./notes 07/28/2010   PACEMAKER IMPLANT N/A 05/02/2018   Procedure: PACEMAKER IMPLANT;  Surgeon: Evans Lance, MD;  Location: [facility];  Service: Cardiovascular;  Laterality: N/A;     OB History   No obstetric history on file.     Family History  Problem Relation Age of Onset   Kidney  disease Mother    Hypertension Mother    Other Father        gangrene with amputation   Diabetes Sister    Cancer Brother    Heart disease Brother    Schizophrenia Daughter    Diabetes Daughter    Stroke Daughter    Diabetes Sister    Breast cancer Neg Hx     Social History   Tobacco Use   Smoking status: Never   Smokeless tobacco: Former    Types: Chew    Quit date: 02/27/2017  Vaping Use   Vaping Use: Never used  Substance Use Topics    Alcohol use: No   Drug use: No    Home Medications Prior to Admission medications   Medication Sig Start Date End Date Taking? Authorizing Provider  albuterol (VENTOLIN HFA) 108 (90 Base) MCG/ACT inhaler Inhale 2 puffs into the lungs every 4 (four) hours as needed for wheezing or shortness of breath. 04/29/20   Medina-Vargas, Monina C, NP  busPIRone (BUSPAR) 5 MG tablet Take 1 tablet (5 mg total) by mouth 3 (three) times daily. 04/29/20   Medina-Vargas, Monina C, NP  donepezil (ARICEPT) 10 MG tablet Take 1 tablet (10 mg total) by mouth at bedtime. 09/05/20   Nita Sells, MD  escitalopram (LEXAPRO) 10 MG tablet Take 1 tablet (10 mg total) by mouth every evening. 09/05/20   Nita Sells, MD  feeding supplement (ENSURE ENLIVE / ENSURE PLUS) LIQD Take 237 mLs by mouth 2 (two) times daily between meals. 04/11/20   Pokhrel, Corrie Mckusick, MD  memantine (NAMENDA XR) 28 MG CP24 24 hr capsule Take 1 capsule (28 mg total) by mouth every morning. 09/05/20   Nita Sells, MD  sodium zirconium cyclosilicate (LOKELMA) 10 g PACK packet Take 10 g by mouth 2 (two) times daily. 09/05/20   Nita Sells, MD    Allergies    Aspirin  Review of Systems   Review of Systems  All other systems reviewed and are negative.  Physical Exam Updated Vital Signs BP 107/78 (BP Location: Right Arm)   Pulse 86   Temp (!) 97.5 F (36.4 C)   Resp 17   SpO2 100%   Physical Exam Vitals and nursing note reviewed.  Constitutional:      Appearance: She is well-developed.  HENT:     Head: Normocephalic and atraumatic.     Mouth/Throat:     Mouth: Mucous membranes are moist.     Pharynx: Oropharynx is clear.  Eyes:     Pupils: Pupils are equal, round, and reactive to light.  Cardiovascular:     Rate and Rhythm: Normal rate and regular rhythm.  Pulmonary:     Effort: No respiratory distress.     Breath sounds: No stridor.  Abdominal:     General: Abdomen is flat. There is no distension.   Musculoskeletal:        General: No swelling or tenderness. Normal range of motion.     Cervical back: Normal range of motion.  Skin:    General: Skin is warm and dry.  Neurological:     General: No focal deficit present.     Mental Status: She is alert.    ED Results / Procedures / Treatments   Labs (all labs ordered are listed, but only abnormal results are displayed) Labs Reviewed  CBC WITH DIFFERENTIAL/PLATELET - Abnormal; Notable for the following components:      Result Value   RBC 3.84 (*)    Hemoglobin  11.7 (*)    MCV 101.3 (*)    All other components within normal limits  COMPREHENSIVE METABOLIC PANEL - Abnormal; Notable for the following components:   Potassium 5.3 (*)    Chloride 114 (*)    Glucose, Bld 131 (*)    BUN 52 (*)    Creatinine, Ser 2.52 (*)    GFR, Estimated 18 (*)    All other components within normal limits  RESP PANEL BY RT-PCR (FLU A&B, COVID) ARPGX2  URINALYSIS, ROUTINE W REFLEX MICROSCOPIC    EKG None  Radiology DG Chest 2 View  Result Date: 12/09/2020 CLINICAL DATA:  Fatigue EXAM: CHEST - 2 VIEW COMPARISON:  08/29/2020 FINDINGS: Lungs are clear.  No pleural effusion or pneumothorax. The heart is normal in size.  Left subclavian pacemaker. Degenerative changes of the visualized thoracolumbar spine. IMPRESSION: Normal chest radiographs. Electronically Signed   By: Julian Hy M.D.   On: 12/09/2020 21:18    Procedures Procedures   Medications Ordered in ED Medications - No data to display  ED Course  I have reviewed the triage vital signs and the nursing notes.  Pertinent labs & imaging results that were available during my care of the patient were reviewed by me and considered in my medical decision making (see chart for details).    MDM Rules/Calculators/A&P                         Patient appears well, will check urine, ct, labs for emesis/weakness.   Labs with AKI. Fluids ordered. Pending urine.   Care transferred  pending.   Final Clinical Impression(s) / ED Diagnoses Final diagnoses:  None    Rx / DC Orders ED Discharge Orders     None        Yale Golla, Corene Cornea, MD 12/12/20 [phone]

## 2020-12-10 ENCOUNTER — Emergency Department (HOSPITAL_COMMUNITY): Payer: Medicare Other

## 2020-12-10 DIAGNOSIS — I499 Cardiac arrhythmia, unspecified: Secondary | ICD-10-CM | POA: Diagnosis not present

## 2020-12-10 DIAGNOSIS — R1111 Vomiting without nausea: Secondary | ICD-10-CM | POA: Diagnosis not present

## 2020-12-10 DIAGNOSIS — N2889 Other specified disorders of kidney and ureter: Secondary | ICD-10-CM | POA: Diagnosis not present

## 2020-12-10 DIAGNOSIS — Z743 Need for continuous supervision: Secondary | ICD-10-CM | POA: Diagnosis not present

## 2020-12-10 DIAGNOSIS — K8020 Calculus of gallbladder without cholecystitis without obstruction: Secondary | ICD-10-CM | POA: Diagnosis not present

## 2020-12-10 DIAGNOSIS — M160 Bilateral primary osteoarthritis of hip: Secondary | ICD-10-CM | POA: Diagnosis not present

## 2020-12-10 DIAGNOSIS — R109 Unspecified abdominal pain: Secondary | ICD-10-CM | POA: Diagnosis not present

## 2020-12-10 DIAGNOSIS — R110 Nausea: Secondary | ICD-10-CM | POA: Diagnosis not present

## 2020-12-10 DIAGNOSIS — R5383 Other fatigue: Secondary | ICD-10-CM | POA: Diagnosis not present

## 2020-12-10 LAB — URINALYSIS, ROUTINE W REFLEX MICROSCOPIC
Bilirubin Urine: NEGATIVE
Glucose, UA: NEGATIVE mg/dL
Hgb urine dipstick: NEGATIVE
Ketones, ur: NEGATIVE mg/dL
Nitrite: NEGATIVE
Protein, ur: NEGATIVE mg/dL
Specific Gravity, Urine: 1.02 (ref 1.005–1.030)
pH: 6 (ref 5.0–8.0)

## 2020-12-10 LAB — RESP PANEL BY RT-PCR (FLU A&B, COVID) ARPGX2
Influenza A by PCR: NEGATIVE
Influenza B by PCR: NEGATIVE
SARS Coronavirus 2 by RT PCR: NEGATIVE

## 2020-12-10 MED ORDER — ONDANSETRON 4 MG PO TBDP: ORAL_TABLET | ORAL | 0 refills | Status: DC

## 2020-12-10 MED ORDER — SODIUM CHLORIDE 0.9 % IV BOLUS
1000.0000 mL | Freq: Once | INTRAVENOUS | Status: AC
  Administered 2020-12-10: 1000 mL via INTRAVENOUS

## 2020-12-10 MED ORDER — SODIUM CHLORIDE 0.9 % IV BOLUS
500.0000 mL | Freq: Once | INTRAVENOUS | Status: AC
  Administered 2020-12-10: 500 mL via INTRAVENOUS

## 2020-12-10 NOTE — ED Notes (Signed)
Pt given saltines and water and encouraged to eat.

## 2020-12-10 NOTE — ED Notes (Signed)
Pt tolerated PO intake w/out issue. Noticed that pt's ostomy bag needed to be emptied. Pt advises that she can empty the bag herself. Gave pt a female urinal to empty into.

## 2020-12-10 NOTE — ED Notes (Signed)
This nurse contacted [facility], spoke with [place]. [facility] to transport pt back to her home residence. [facility] eta "within the hour."

## 2020-12-10 NOTE — ED Notes (Signed)
This nurse attempted to contact pt's granddaughter, however, phone numbers listed in chart are not working telephone numbers at this time.

## 2020-12-10 NOTE — ED Notes (Signed)
Pt attached to cardiac montior x3. A&O x4. NAD. VSS.

## 2020-12-10 NOTE — ED Notes (Addendum)
[phone] [name] ) Patients granddaughter would like a call back with an update

## 2020-12-10 NOTE — ED Provider Notes (Signed)
Signout from Dr. Dayna Barker.  87 year old female presenting by EMS for evaluation of fatigue.  Reportedly had vomited multiple times.  Has had no vomiting here.  Work-up showing new AKI.  Getting IV fluids.  CT imaging does not show any acute findings.  Plan is to follow-up on urinalysis and treat with antibiotics if indicated. Physical Exam  BP 129/70 (BP Location: Right Arm)   Pulse 60   Temp 97.8 F (36.6 C) (Oral)   Resp 14   SpO2 100%   Physical Exam  ED Course/Procedures     Procedures  MDM  Patient's urinalysis shows 6-10 whites rare bacteria nitrite negative.  No evidence of definitive infection.  We will hold off on antibiotics.  Patient has no complaints and in no distress.  Lives at home.  Uses a walker and does have a walker with her.  We will talk with nursing regarding how to get patient home.       Hayden Rasmussen, MD 12/10/20 1725

## 2020-12-10 NOTE — ED Notes (Signed)
Attempted to call granddaughter 4 times to notify [facility] was leaving with patient. No answer and no VM set up.

## 2020-12-16 DIAGNOSIS — I5033 Acute on chronic diastolic (congestive) heart failure: Secondary | ICD-10-CM | POA: Diagnosis not present

## 2020-12-16 DIAGNOSIS — I110 Hypertensive heart disease with heart failure: Secondary | ICD-10-CM | POA: Diagnosis not present

## 2020-12-16 DIAGNOSIS — R269 Unspecified abnormalities of gait and mobility: Secondary | ICD-10-CM | POA: Diagnosis not present

## 2020-12-16 DIAGNOSIS — I10 Essential (primary) hypertension: Secondary | ICD-10-CM | POA: Diagnosis not present

## 2020-12-16 DIAGNOSIS — E1165 Type 2 diabetes mellitus with hyperglycemia: Secondary | ICD-10-CM | POA: Diagnosis not present

## 2020-12-16 DIAGNOSIS — J45909 Unspecified asthma, uncomplicated: Secondary | ICD-10-CM | POA: Diagnosis not present

## 2020-12-16 DIAGNOSIS — Z7984 Long term (current) use of oral hypoglycemic drugs: Secondary | ICD-10-CM | POA: Diagnosis not present

## 2021-01-02 DIAGNOSIS — Z933 Colostomy status: Secondary | ICD-10-CM | POA: Diagnosis not present

## 2021-01-02 DIAGNOSIS — K631 Perforation of intestine (nontraumatic): Secondary | ICD-10-CM | POA: Diagnosis not present

## 2021-01-03 DIAGNOSIS — Z0289 Encounter for other administrative examinations: Secondary | ICD-10-CM | POA: Diagnosis not present

## 2021-02-03 DIAGNOSIS — Z933 Colostomy status: Secondary | ICD-10-CM | POA: Diagnosis not present

## 2021-02-03 DIAGNOSIS — K631 Perforation of intestine (nontraumatic): Secondary | ICD-10-CM | POA: Diagnosis not present

## 2021-03-12 DIAGNOSIS — K631 Perforation of intestine (nontraumatic): Secondary | ICD-10-CM | POA: Diagnosis not present

## 2021-03-12 DIAGNOSIS — Z933 Colostomy status: Secondary | ICD-10-CM | POA: Diagnosis not present

## 2021-03-26 DIAGNOSIS — J45901 Unspecified asthma with (acute) exacerbation: Secondary | ICD-10-CM | POA: Diagnosis not present

## 2021-03-26 DIAGNOSIS — Z0000 Encounter for general adult medical examination without abnormal findings: Secondary | ICD-10-CM | POA: Diagnosis not present

## 2021-03-26 DIAGNOSIS — Z1389 Encounter for screening for other disorder: Secondary | ICD-10-CM | POA: Diagnosis not present

## 2021-03-26 DIAGNOSIS — Z933 Colostomy status: Secondary | ICD-10-CM | POA: Diagnosis not present

## 2021-03-26 DIAGNOSIS — Z23 Encounter for immunization: Secondary | ICD-10-CM | POA: Diagnosis not present

## 2021-03-26 DIAGNOSIS — M109 Gout, unspecified: Secondary | ICD-10-CM | POA: Diagnosis not present

## 2021-03-26 DIAGNOSIS — I10 Essential (primary) hypertension: Secondary | ICD-10-CM | POA: Diagnosis not present

## 2021-03-26 DIAGNOSIS — M810 Age-related osteoporosis without current pathological fracture: Secondary | ICD-10-CM | POA: Diagnosis not present

## 2021-03-26 DIAGNOSIS — E1165 Type 2 diabetes mellitus with hyperglycemia: Secondary | ICD-10-CM | POA: Diagnosis not present

## 2021-03-26 DIAGNOSIS — J45909 Unspecified asthma, uncomplicated: Secondary | ICD-10-CM | POA: Diagnosis not present

## 2021-03-26 DIAGNOSIS — R262 Difficulty in walking, not elsewhere classified: Secondary | ICD-10-CM | POA: Diagnosis not present

## 2021-03-26 DIAGNOSIS — R269 Unspecified abnormalities of gait and mobility: Secondary | ICD-10-CM | POA: Diagnosis not present

## 2021-03-28 DIAGNOSIS — R531 Weakness: Secondary | ICD-10-CM | POA: Diagnosis not present

## 2021-03-28 DIAGNOSIS — I5031 Acute diastolic (congestive) heart failure: Secondary | ICD-10-CM | POA: Diagnosis not present

## 2021-03-28 DIAGNOSIS — R296 Repeated falls: Secondary | ICD-10-CM | POA: Diagnosis not present

## 2021-03-28 DIAGNOSIS — I10 Essential (primary) hypertension: Secondary | ICD-10-CM | POA: Diagnosis not present

## 2021-03-28 DIAGNOSIS — E1165 Type 2 diabetes mellitus with hyperglycemia: Secondary | ICD-10-CM | POA: Diagnosis not present

## 2021-03-28 DIAGNOSIS — R262 Difficulty in walking, not elsewhere classified: Secondary | ICD-10-CM | POA: Diagnosis not present

## 2021-03-28 DIAGNOSIS — J45909 Unspecified asthma, uncomplicated: Secondary | ICD-10-CM | POA: Diagnosis not present

## 2021-03-28 DIAGNOSIS — R269 Unspecified abnormalities of gait and mobility: Secondary | ICD-10-CM | POA: Diagnosis not present

## 2021-04-02 DIAGNOSIS — I10 Essential (primary) hypertension: Secondary | ICD-10-CM | POA: Diagnosis not present

## 2021-04-02 DIAGNOSIS — R296 Repeated falls: Secondary | ICD-10-CM | POA: Diagnosis not present

## 2021-04-02 DIAGNOSIS — R262 Difficulty in walking, not elsewhere classified: Secondary | ICD-10-CM | POA: Diagnosis not present

## 2021-04-02 DIAGNOSIS — R269 Unspecified abnormalities of gait and mobility: Secondary | ICD-10-CM | POA: Diagnosis not present

## 2021-04-02 DIAGNOSIS — R531 Weakness: Secondary | ICD-10-CM | POA: Diagnosis not present

## 2021-04-02 DIAGNOSIS — J45909 Unspecified asthma, uncomplicated: Secondary | ICD-10-CM | POA: Diagnosis not present

## 2021-04-02 DIAGNOSIS — E1165 Type 2 diabetes mellitus with hyperglycemia: Secondary | ICD-10-CM | POA: Diagnosis not present

## 2021-04-02 DIAGNOSIS — I5031 Acute diastolic (congestive) heart failure: Secondary | ICD-10-CM | POA: Diagnosis not present

## 2021-04-04 DIAGNOSIS — I5031 Acute diastolic (congestive) heart failure: Secondary | ICD-10-CM | POA: Diagnosis not present

## 2021-04-04 DIAGNOSIS — R296 Repeated falls: Secondary | ICD-10-CM | POA: Diagnosis not present

## 2021-04-04 DIAGNOSIS — E1165 Type 2 diabetes mellitus with hyperglycemia: Secondary | ICD-10-CM | POA: Diagnosis not present

## 2021-04-04 DIAGNOSIS — J45909 Unspecified asthma, uncomplicated: Secondary | ICD-10-CM | POA: Diagnosis not present

## 2021-04-04 DIAGNOSIS — R269 Unspecified abnormalities of gait and mobility: Secondary | ICD-10-CM | POA: Diagnosis not present

## 2021-04-04 DIAGNOSIS — I10 Essential (primary) hypertension: Secondary | ICD-10-CM | POA: Diagnosis not present

## 2021-04-04 DIAGNOSIS — R531 Weakness: Secondary | ICD-10-CM | POA: Diagnosis not present

## 2021-04-04 DIAGNOSIS — R262 Difficulty in walking, not elsewhere classified: Secondary | ICD-10-CM | POA: Diagnosis not present

## 2021-04-07 DIAGNOSIS — R531 Weakness: Secondary | ICD-10-CM | POA: Diagnosis not present

## 2021-04-07 DIAGNOSIS — R269 Unspecified abnormalities of gait and mobility: Secondary | ICD-10-CM | POA: Diagnosis not present

## 2021-04-07 DIAGNOSIS — I5031 Acute diastolic (congestive) heart failure: Secondary | ICD-10-CM | POA: Diagnosis not present

## 2021-04-07 DIAGNOSIS — I10 Essential (primary) hypertension: Secondary | ICD-10-CM | POA: Diagnosis not present

## 2021-04-07 DIAGNOSIS — R262 Difficulty in walking, not elsewhere classified: Secondary | ICD-10-CM | POA: Diagnosis not present

## 2021-04-07 DIAGNOSIS — R296 Repeated falls: Secondary | ICD-10-CM | POA: Diagnosis not present

## 2021-04-07 DIAGNOSIS — E1165 Type 2 diabetes mellitus with hyperglycemia: Secondary | ICD-10-CM | POA: Diagnosis not present

## 2021-04-07 DIAGNOSIS — J45909 Unspecified asthma, uncomplicated: Secondary | ICD-10-CM | POA: Diagnosis not present

## 2021-04-10 DIAGNOSIS — E1122 Type 2 diabetes mellitus with diabetic chronic kidney disease: Secondary | ICD-10-CM | POA: Diagnosis not present

## 2021-04-10 DIAGNOSIS — N184 Chronic kidney disease, stage 4 (severe): Secondary | ICD-10-CM | POA: Diagnosis not present

## 2021-04-10 DIAGNOSIS — Z87442 Personal history of urinary calculi: Secondary | ICD-10-CM | POA: Diagnosis not present

## 2021-04-10 DIAGNOSIS — I509 Heart failure, unspecified: Secondary | ICD-10-CM | POA: Diagnosis not present

## 2021-04-10 DIAGNOSIS — N1832 Chronic kidney disease, stage 3b: Secondary | ICD-10-CM | POA: Diagnosis not present

## 2021-04-10 DIAGNOSIS — Z933 Colostomy status: Secondary | ICD-10-CM | POA: Diagnosis not present

## 2021-04-10 DIAGNOSIS — N2581 Secondary hyperparathyroidism of renal origin: Secondary | ICD-10-CM | POA: Diagnosis not present

## 2021-04-10 DIAGNOSIS — D649 Anemia, unspecified: Secondary | ICD-10-CM | POA: Diagnosis not present

## 2021-04-11 DIAGNOSIS — J45909 Unspecified asthma, uncomplicated: Secondary | ICD-10-CM | POA: Diagnosis not present

## 2021-04-11 DIAGNOSIS — R296 Repeated falls: Secondary | ICD-10-CM | POA: Diagnosis not present

## 2021-04-11 DIAGNOSIS — E1165 Type 2 diabetes mellitus with hyperglycemia: Secondary | ICD-10-CM | POA: Diagnosis not present

## 2021-04-11 DIAGNOSIS — I10 Essential (primary) hypertension: Secondary | ICD-10-CM | POA: Diagnosis not present

## 2021-04-11 DIAGNOSIS — R269 Unspecified abnormalities of gait and mobility: Secondary | ICD-10-CM | POA: Diagnosis not present

## 2021-04-11 DIAGNOSIS — R262 Difficulty in walking, not elsewhere classified: Secondary | ICD-10-CM | POA: Diagnosis not present

## 2021-04-11 DIAGNOSIS — R531 Weakness: Secondary | ICD-10-CM | POA: Diagnosis not present

## 2021-04-11 DIAGNOSIS — I5031 Acute diastolic (congestive) heart failure: Secondary | ICD-10-CM | POA: Diagnosis not present

## 2021-04-21 DIAGNOSIS — I10 Essential (primary) hypertension: Secondary | ICD-10-CM | POA: Diagnosis not present

## 2021-04-21 DIAGNOSIS — R262 Difficulty in walking, not elsewhere classified: Secondary | ICD-10-CM | POA: Diagnosis not present

## 2021-04-21 DIAGNOSIS — E1165 Type 2 diabetes mellitus with hyperglycemia: Secondary | ICD-10-CM | POA: Diagnosis not present

## 2021-04-21 DIAGNOSIS — R269 Unspecified abnormalities of gait and mobility: Secondary | ICD-10-CM | POA: Diagnosis not present

## 2021-04-21 DIAGNOSIS — R531 Weakness: Secondary | ICD-10-CM | POA: Diagnosis not present

## 2021-04-21 DIAGNOSIS — R296 Repeated falls: Secondary | ICD-10-CM | POA: Diagnosis not present

## 2021-04-21 DIAGNOSIS — I5031 Acute diastolic (congestive) heart failure: Secondary | ICD-10-CM | POA: Diagnosis not present

## 2021-04-21 DIAGNOSIS — J45909 Unspecified asthma, uncomplicated: Secondary | ICD-10-CM | POA: Diagnosis not present

## 2021-05-26 DIAGNOSIS — Z933 Colostomy status: Secondary | ICD-10-CM | POA: Diagnosis not present

## 2021-05-26 DIAGNOSIS — K631 Perforation of intestine (nontraumatic): Secondary | ICD-10-CM | POA: Diagnosis not present

## 2021-05-27 DIAGNOSIS — K631 Perforation of intestine (nontraumatic): Secondary | ICD-10-CM | POA: Diagnosis not present

## 2021-05-27 DIAGNOSIS — Z933 Colostomy status: Secondary | ICD-10-CM | POA: Diagnosis not present

## 2021-06-13 ENCOUNTER — Other Ambulatory Visit: Payer: Self-pay | Admitting: *Deleted

## 2021-06-13 NOTE — Patient Outreach (Signed)
[facility]) Care Management ? ?06/13/2021 ? ?[name] ?27-Jul-1933 ?[id number] ? ? ?RN case closure reason: MD office has RN CM that will follow patient. ?. ? ?Plan: ?RN sent patient case closure letter. ? ? ?Johny Shock BSN RN ?[facility] ?[phone] ? ? ?

## 2021-06-26 DIAGNOSIS — Z933 Colostomy status: Secondary | ICD-10-CM | POA: Diagnosis not present

## 2021-06-26 DIAGNOSIS — K631 Perforation of intestine (nontraumatic): Secondary | ICD-10-CM | POA: Diagnosis not present

## 2021-07-03 DIAGNOSIS — Z933 Colostomy status: Secondary | ICD-10-CM | POA: Diagnosis not present

## 2021-07-03 DIAGNOSIS — K631 Perforation of intestine (nontraumatic): Secondary | ICD-10-CM | POA: Diagnosis not present

## 2021-08-11 ENCOUNTER — Emergency Department (HOSPITAL_COMMUNITY): Payer: Medicare Other

## 2021-08-11 ENCOUNTER — Other Ambulatory Visit: Payer: Self-pay

## 2021-08-11 ENCOUNTER — Emergency Department (HOSPITAL_COMMUNITY)
Admission: EM | Admit: 2021-08-11 | Discharge: 2021-08-11 | Disposition: A | Discharge status: Home / Self Care | Payer: Medicare Other | Attending: Emergency Medicine | Admitting: Emergency Medicine

## 2021-08-11 ENCOUNTER — Encounter (HOSPITAL_COMMUNITY): Payer: Self-pay | Admitting: Pharmacy Technician

## 2021-08-11 DIAGNOSIS — N189 Chronic kidney disease, unspecified: Secondary | ICD-10-CM | POA: Diagnosis not present

## 2021-08-11 DIAGNOSIS — I129 Hypertensive chronic kidney disease with stage 1 through stage 4 chronic kidney disease, or unspecified chronic kidney disease: Secondary | ICD-10-CM | POA: Insufficient documentation

## 2021-08-11 DIAGNOSIS — R55 Syncope and collapse: Secondary | ICD-10-CM | POA: Diagnosis not present

## 2021-08-11 DIAGNOSIS — R61 Generalized hyperhidrosis: Secondary | ICD-10-CM | POA: Diagnosis not present

## 2021-08-11 DIAGNOSIS — F0390 Unspecified dementia without behavioral disturbance: Secondary | ICD-10-CM | POA: Diagnosis not present

## 2021-08-11 DIAGNOSIS — R464 Slowness and poor responsiveness: Secondary | ICD-10-CM | POA: Diagnosis not present

## 2021-08-11 DIAGNOSIS — R404 Transient alteration of awareness: Secondary | ICD-10-CM | POA: Diagnosis not present

## 2021-08-11 DIAGNOSIS — R0902 Hypoxemia: Secondary | ICD-10-CM | POA: Diagnosis not present

## 2021-08-11 DIAGNOSIS — R42 Dizziness and giddiness: Secondary | ICD-10-CM | POA: Diagnosis not present

## 2021-08-11 DIAGNOSIS — R6889 Other general symptoms and signs: Secondary | ICD-10-CM | POA: Diagnosis not present

## 2021-08-11 DIAGNOSIS — Z743 Need for continuous supervision: Secondary | ICD-10-CM | POA: Diagnosis not present

## 2021-08-11 LAB — BASIC METABOLIC PANEL
Anion gap: 5 (ref 5–15)
BUN: 35 mg/dL — ABNORMAL HIGH (ref 8–23)
CO2: 26 mmol/L (ref 22–32)
Calcium: 8.7 mg/dL — ABNORMAL LOW (ref 8.9–10.3)
Chloride: 112 mmol/L — ABNORMAL HIGH (ref 98–111)
Creatinine, Ser: 1.75 mg/dL — ABNORMAL HIGH (ref 0.44–1.00)
GFR, Estimated: 28 mL/min — ABNORMAL LOW (ref >60)
Glucose, Bld: 159 mg/dL — ABNORMAL HIGH (ref 70–99)
Potassium: 4.3 mmol/L (ref 3.5–5.1)
Sodium: 143 mmol/L (ref 135–145)

## 2021-08-11 LAB — CBC
HCT: 33.2 % — ABNORMAL LOW (ref 36.0–46.0)
Hemoglobin: 10.3 g/dL — ABNORMAL LOW (ref 12.0–15.0)
MCH: 30.3 pg (ref 26.0–34.0)
MCHC: 31 g/dL (ref 30.0–36.0)
MCV: 97.6 fL (ref 80.0–100.0)
Platelets: 157 10*3/uL (ref 150–400)
RBC: 3.4 MIL/uL — ABNORMAL LOW (ref 3.87–5.11)
RDW: 12.9 % (ref 11.5–15.5)
WBC: 5.7 10*3/uL (ref 4.0–10.5)
nRBC: 0 % (ref 0.0–0.2)

## 2021-08-11 LAB — CBG MONITORING, ED: Glucose-Capillary: 128 mg/dL — ABNORMAL HIGH (ref 70–99)

## 2021-08-11 NOTE — ED Triage Notes (Signed)
Pt bib ems from home with witnessed near syncope by granddaughter while pt was sitting at the table. On scene EMS reports pt diaphoretic with BP 76/40. Given 500cc NS en route and last BP 132/56. Pt denies any complaints.

## 2021-08-11 NOTE — ED Notes (Signed)
[facility] is here for transport.  

## 2021-08-11 NOTE — ED Provider Notes (Signed)
[facility] DEPT Provider Note   CSN: [id number] Arrival date & time: 08/11/21  1840     History {Add pertinent medical, surgical, social history, OB history to HPI:1} Chief Complaint  Patient presents with   Near Syncope    [name] is a 88 y.o. female.  He has a history of CKD, dementia, hypertension.  Per EMS she was at home with family members when she had an unresponsive episode.  EMS had a blood pressure initially in the 70s and diaphoretic.  She was given IV fluids with improvement in her mental status.  She remembers waking up in the ambulance not sure why she was there.  Currently she is awake alert and denies any complaints.  She said she has been eating and drinking well, she denies any prior history of syncope.  The history is provided by the patient and the EMS personnel.  Near Syncope This is a new problem. The current episode started less than 1 hour ago. The problem has been resolved. Pertinent negatives include no chest pain, no abdominal pain, no headaches and no shortness of breath. Nothing aggravates the symptoms. Relieved by: IV fluids. Treatments tried: IV fluids. The treatment provided significant relief.      Home Medications Prior to Admission medications   Medication Sig Start Date End Date Taking? Authorizing Provider  albuterol (VENTOLIN HFA) 108 (90 Base) MCG/ACT inhaler Inhale 2 puffs into the lungs every 4 (four) hours as needed for wheezing or shortness of breath. 04/29/20   Medina-Vargas, Monina C, NP  busPIRone (BUSPAR) 5 MG tablet Take 1 tablet (5 mg total) by mouth 3 (three) times daily. 04/29/20   Medina-Vargas, Monina C, NP  donepezil (ARICEPT) 10 MG tablet Take 1 tablet (10 mg total) by mouth at bedtime. 09/05/20   Nita Sells, MD  escitalopram (LEXAPRO) 10 MG tablet Take 1 tablet (10 mg total) by mouth every evening. 09/05/20   Nita Sells, MD  feeding supplement (ENSURE ENLIVE / ENSURE PLUS) LIQD  Take 237 mLs by mouth 2 (two) times daily between meals. 04/11/20   Pokhrel, Corrie Mckusick, MD  memantine (NAMENDA XR) 28 MG CP24 24 hr capsule Take 1 capsule (28 mg total) by mouth every morning. 09/05/20   Nita Sells, MD  ondansetron (ZOFRAN ODT) 4 MG disintegrating tablet 4mg  ODT q4 hours prn nausea/vomit 12/10/20   Mesner, Corene Cornea, MD  sodium zirconium cyclosilicate (LOKELMA) 10 g PACK packet Take 10 g by mouth 2 (two) times daily. 09/05/20   Nita Sells, MD      Allergies    Aspirin    Review of Systems   Review of Systems  Constitutional:  Negative for fever.  HENT:  Negative for sore throat.   Eyes:  Negative for visual disturbance.  Respiratory:  Negative for shortness of breath.   Cardiovascular:  Positive for near-syncope. Negative for chest pain.  Gastrointestinal:  Negative for abdominal pain.  Genitourinary:  Negative for dysuria.  Musculoskeletal:  Negative for back pain.  Skin:  Negative for rash.  Neurological:  Positive for syncope. Negative for headaches.   Physical Exam Updated Vital Signs BP 130/80   Pulse (!) 59   Resp 16   SpO2 99%  Physical Exam Vitals and nursing note reviewed.  Constitutional:      General: She is not in acute distress.    Appearance: Normal appearance. She is well-developed.  HENT:     Head: Normocephalic and atraumatic.  Eyes:     Conjunctiva/sclera: Conjunctivae  normal.  Cardiovascular:     Rate and Rhythm: Normal rate and regular rhythm.     Heart sounds: No murmur heard. Pulmonary:     Effort: Pulmonary effort is normal. No respiratory distress.     Breath sounds: Normal breath sounds.  Abdominal:     Palpations: Abdomen is soft.     Tenderness: There is no abdominal tenderness. There is no guarding or rebound.  Musculoskeletal:        General: No swelling.     Cervical back: Neck supple.     Right lower leg: No edema.     Left lower leg: No edema.  Skin:    General: Skin is warm and dry.     Capillary Refill:  Capillary refill takes less than 2 seconds.  Neurological:     General: No focal deficit present.     Mental Status: She is alert.     Motor: No weakness.     Gait: Gait normal.    ED Results / Procedures / Treatments   Labs (all labs ordered are listed, but only abnormal results are displayed) Labs Reviewed  BASIC METABOLIC PANEL - Abnormal; Notable for the following components:      Result Value   Chloride 112 (*)    Glucose, Bld 159 (*)    BUN 35 (*)    Creatinine, Ser 1.75 (*)    Calcium 8.7 (*)    GFR, Estimated 28 (*)    All other components within normal limits  CBC - Abnormal; Notable for the following components:   RBC 3.40 (*)    Hemoglobin 10.3 (*)    HCT 33.2 (*)    All other components within normal limits  URINALYSIS, ROUTINE W REFLEX MICROSCOPIC  CBG MONITORING, ED    EKG None  Radiology No results found.  Procedures Procedures  {Document cardiac monitor, telemetry assessment procedure when appropriate:1}  Medications Ordered in ED Medications - No data to display  ED Course/ Medical Decision Making/ A&P                           Medical Decision Making Amount and/or Complexity of Data Reviewed Labs: ordered.   This patient complains of ***; this involves an extensive number of treatment Options and is a complaint that carries with it a high risk of complications and morbidity. The differential includes ***  I ordered, reviewed and interpreted labs, which included *** I ordered medication *** and reviewed PMP when indicated. I ordered imaging studies which included *** and I independently    visualized and interpreted imaging which showed *** Additional history obtained from *** Previous records obtained and reviewed *** I consulted *** and discussed lab and imaging findings and discussed disposition.  Cardiac monitoring reviewed, *** Social determinants considered, *** Critical Interventions: ***  After the interventions stated above,  I reevaluated the patient and found *** Admission and further testing considered, ***   {Document critical care time when appropriate:1} {Document review of labs and clinical decision tools ie heart score, Chads2Vasc2 etc:1}  {Document your independent review of radiology images, and any outside records:1} {Document your discussion with family members, caretakers, and with consultants:1} {Document social determinants of health affecting pt's care:1} {Document your decision making why or why not admission, treatments were needed:1} Final Clinical Impression(s) / ED Diagnoses Final diagnoses:  None    Rx / DC Orders ED Discharge Orders     None

## 2021-08-11 NOTE — Discharge Instructions (Signed)
You were seen in the emergency department for a near syncopal event at home.  EMS said your blood pressure was low and you were less responsive but improved after some fluids.  You been awake and alert here and your labs were unremarkable.  Please stay well-hydrated and follow-up with your primary care doctor.  Return to the emergency department if any worsening or concerning symptoms.

## 2021-08-11 NOTE — ED Notes (Signed)
Called granddaughter to let her know the patient was on her way home.

## 2021-08-11 NOTE — ED Notes (Signed)
Called [facility] for transport.  

## 2021-08-11 NOTE — ED Notes (Signed)
[employer] Pacemaker interrogated. Waiting for report.

## 2021-08-12 ENCOUNTER — Ambulatory Visit (INDEPENDENT_AMBULATORY_CARE_PROVIDER_SITE_OTHER): Payer: Medicare Other

## 2021-08-12 DIAGNOSIS — I5032 Chronic diastolic (congestive) heart failure: Secondary | ICD-10-CM | POA: Diagnosis not present

## 2021-08-13 LAB — CUP PACEART REMOTE DEVICE CHECK
Battery Remaining Longevity: 78 mo
Battery Remaining Percentage: 70 %
Battery Voltage: 3.01 V
Brady Statistic AP VP Percent: 1 %
Brady Statistic AP VS Percent: 73 %
Brady Statistic AS VP Percent: 1 %
Brady Statistic AS VS Percent: 27 %
Brady Statistic RA Percent Paced: 72 %
Brady Statistic RV Percent Paced: 1 %
Date Time Interrogation Session: 20230529202142
Implantable Lead Implant Date: 20200217
Implantable Lead Implant Date: 20200217
Implantable Lead Location: 753859
Implantable Lead Location: 753860
Implantable Pulse Generator Implant Date: 20200217
Lead Channel Impedance Value: 390 Ohm
Lead Channel Impedance Value: 400 Ohm
Lead Channel Pacing Threshold Amplitude: 0.5 V
Lead Channel Pacing Threshold Amplitude: 0.5 V
Lead Channel Pacing Threshold Pulse Width: 0.5 ms
Lead Channel Pacing Threshold Pulse Width: 0.5 ms
Lead Channel Sensing Intrinsic Amplitude: 1.5 mV
Lead Channel Sensing Intrinsic Amplitude: 11.3 mV
Lead Channel Setting Pacing Amplitude: 2 V
Lead Channel Setting Pacing Amplitude: 2.5 V
Lead Channel Setting Pacing Pulse Width: 0.5 ms
Lead Channel Setting Sensing Sensitivity: 2 mV
Pulse Gen Model: 2272
Pulse Gen Serial Number: 9107620

## 2021-08-25 ENCOUNTER — Emergency Department (HOSPITAL_COMMUNITY): Payer: Medicare Other

## 2021-08-25 ENCOUNTER — Other Ambulatory Visit: Payer: Self-pay

## 2021-08-25 ENCOUNTER — Encounter (HOSPITAL_COMMUNITY): Payer: Self-pay

## 2021-08-25 ENCOUNTER — Emergency Department (HOSPITAL_COMMUNITY)
Admission: EM | Admit: 2021-08-25 | Discharge: 2021-08-26 | Disposition: A | Discharge status: Home / Self Care | Payer: Medicare Other | Attending: Emergency Medicine | Admitting: Emergency Medicine

## 2021-08-25 DIAGNOSIS — I639 Cerebral infarction, unspecified: Secondary | ICD-10-CM | POA: Diagnosis not present

## 2021-08-25 DIAGNOSIS — R42 Dizziness and giddiness: Secondary | ICD-10-CM | POA: Diagnosis not present

## 2021-08-25 DIAGNOSIS — F0390 Unspecified dementia without behavioral disturbance: Secondary | ICD-10-CM | POA: Diagnosis not present

## 2021-08-25 DIAGNOSIS — J329 Chronic sinusitis, unspecified: Secondary | ICD-10-CM | POA: Diagnosis not present

## 2021-08-25 DIAGNOSIS — N189 Chronic kidney disease, unspecified: Secondary | ICD-10-CM | POA: Diagnosis not present

## 2021-08-25 DIAGNOSIS — R410 Disorientation, unspecified: Secondary | ICD-10-CM | POA: Insufficient documentation

## 2021-08-25 DIAGNOSIS — D631 Anemia in chronic kidney disease: Secondary | ICD-10-CM | POA: Diagnosis not present

## 2021-08-25 DIAGNOSIS — R569 Unspecified convulsions: Secondary | ICD-10-CM | POA: Diagnosis not present

## 2021-08-25 DIAGNOSIS — R110 Nausea: Secondary | ICD-10-CM | POA: Insufficient documentation

## 2021-08-25 DIAGNOSIS — R55 Syncope and collapse: Secondary | ICD-10-CM | POA: Diagnosis not present

## 2021-08-25 DIAGNOSIS — R404 Transient alteration of awareness: Secondary | ICD-10-CM | POA: Diagnosis not present

## 2021-08-25 DIAGNOSIS — R6889 Other general symptoms and signs: Secondary | ICD-10-CM | POA: Diagnosis not present

## 2021-08-25 DIAGNOSIS — S0990XA Unspecified injury of head, initial encounter: Secondary | ICD-10-CM | POA: Diagnosis not present

## 2021-08-25 LAB — CBC WITH DIFFERENTIAL/PLATELET
Abs Immature Granulocytes: 0.02 10*3/uL (ref 0.00–0.07)
Basophils Absolute: 0.1 10*3/uL (ref 0.0–0.1)
Basophils Relative: 1 %
Eosinophils Absolute: 0.1 10*3/uL (ref 0.0–0.5)
Eosinophils Relative: 2 %
HCT: 36.5 % (ref 36.0–46.0)
Hemoglobin: 10.6 g/dL — ABNORMAL LOW (ref 12.0–15.0)
Immature Granulocytes: 0 %
Lymphocytes Relative: 17 %
Lymphs Abs: 1.1 10*3/uL (ref 0.7–4.0)
MCH: 29.9 pg (ref 26.0–34.0)
MCHC: 29 g/dL — ABNORMAL LOW (ref 30.0–36.0)
MCV: 103.1 fL — ABNORMAL HIGH (ref 80.0–100.0)
Monocytes Absolute: 0.5 10*3/uL (ref 0.1–1.0)
Monocytes Relative: 8 %
Neutro Abs: 4.7 10*3/uL (ref 1.7–7.7)
Neutrophils Relative %: 72 %
Platelets: 196 10*3/uL (ref 150–400)
RBC: 3.54 MIL/uL — ABNORMAL LOW (ref 3.87–5.11)
RDW: 12.7 % (ref 11.5–15.5)
WBC: 6.5 10*3/uL (ref 4.0–10.5)
nRBC: 0 % (ref 0.0–0.2)

## 2021-08-25 LAB — COMPREHENSIVE METABOLIC PANEL
ALT: 18 U/L (ref 0–44)
AST: 42 U/L — ABNORMAL HIGH (ref 15–41)
Albumin: 3.3 g/dL — ABNORMAL LOW (ref 3.5–5.0)
Alkaline Phosphatase: 65 U/L (ref 38–126)
Anion gap: 9 (ref 5–15)
BUN: 32 mg/dL — ABNORMAL HIGH (ref 8–23)
CO2: 21 mmol/L — ABNORMAL LOW (ref 22–32)
Calcium: 9 mg/dL (ref 8.9–10.3)
Chloride: 112 mmol/L — ABNORMAL HIGH (ref 98–111)
Creatinine, Ser: 2.07 mg/dL — ABNORMAL HIGH (ref 0.44–1.00)
GFR, Estimated: 23 mL/min — ABNORMAL LOW (ref >60)
Glucose, Bld: 132 mg/dL — ABNORMAL HIGH (ref 70–99)
Potassium: 4.5 mmol/L (ref 3.5–5.1)
Sodium: 142 mmol/L (ref 135–145)
Total Bilirubin: 0.4 mg/dL (ref 0.3–1.2)
Total Protein: 6.5 g/dL (ref 6.5–8.1)

## 2021-08-25 LAB — URINALYSIS, ROUTINE W REFLEX MICROSCOPIC
Bilirubin Urine: NEGATIVE
Glucose, UA: NEGATIVE mg/dL
Ketones, ur: NEGATIVE mg/dL
Nitrite: NEGATIVE
Protein, ur: NEGATIVE mg/dL
Specific Gravity, Urine: 1.015 (ref 1.005–1.030)
pH: 5 (ref 5.0–8.0)

## 2021-08-25 LAB — CBG MONITORING, ED: Glucose-Capillary: 151 mg/dL — ABNORMAL HIGH (ref 70–99)

## 2021-08-25 MED ORDER — SODIUM CHLORIDE 0.9 % IV BOLUS
500.0000 mL | Freq: Once | INTRAVENOUS | Status: AC
  Administered 2021-08-25: 500 mL via INTRAVENOUS

## 2021-08-25 NOTE — ED Notes (Signed)
Attempted to reach pt daughter Clovis Warwick in reference to transportation home. Called both numbers in the chart and there was no answer

## 2021-08-25 NOTE — Discharge Instructions (Signed)
If you develop new or worsening dizziness, lightheadedness, if you pass out, develop headache, chest pain, or any other new/concerning symptoms then return to the ER for evaluation.

## 2021-08-25 NOTE — ED Notes (Signed)
No answer x1

## 2021-08-25 NOTE — ED Notes (Signed)
IV attempt unsuccessful

## 2021-08-25 NOTE — ED Provider Triage Note (Signed)
Emergency Medicine Provider Triage Evaluation Note  [name] , a 88 y.o. female  was evaluated in triage.  Pt complains of fall/syncope.  Does not member the event.  Unsure whether she hit her head.  Denies Hx of DMT2, per records indicates she does have this.  Hx of dementia.  Poor historian.  Not on anticoagulation.  Review of Systems  Positive:  Negative: As above  Physical Exam  BP (!) 100/52 (BP Location: Right Arm)   Pulse 62   Temp 97.9 F (36.6 C) (Oral)   Resp 16   SpO2 99%  Gen:   Awake, oriented to self and place Resp:  Normal effort, CTAB MSK:   Moves extremities with mild difficulty Other:  Abdomen soft nontender.  RRR without M/R/G.  Mildly hypotensive 100/52.  Medical Decision Making  Medically screening exam initiated at 3:08 PM.  Appropriate orders placed.  [name] was informed that the remainder of the evaluation will be completed by another provider, this initial triage assessment does not replace that evaluation, and the importance of remaining in the ED until their evaluation is complete.  Unable to establish if patient is at baseline, with known Hx of dementia.  Discussed with triage nurse and charge nurse, they are aware.   Sandy Rome, PA-C 85/50/15 1515

## 2021-08-25 NOTE — ED Notes (Signed)
Requested secretary to order colostomy bag at this time

## 2021-08-25 NOTE — ED Notes (Signed)
Spoke with [name] at this time and was advised that she doesn't drive and she is requesting for the ambulance to come get her and take her home. Advised her that we could put the request in and how long time could vary however she would need to answer phone when called to get her home by [facility]

## 2021-08-25 NOTE — ED Notes (Signed)
Patient ambulated. Provider witnessed patient ambulating

## 2021-08-25 NOTE — ED Notes (Signed)
IV team at bedside attempting PIV at this time

## 2021-08-25 NOTE — ED Notes (Signed)
Attempted PIV x 3 and was unsuccessful. IV team consult placed

## 2021-08-25 NOTE — ED Notes (Signed)
Received verbal report from Megan R RN at t his time °

## 2021-08-25 NOTE — ED Provider Notes (Signed)
[facility] EMERGENCY DEPARTMENT Provider Note   CSN: [id number] Arrival date & time: 08/25/21  1439  LEVEL 5 CAVEAT - DEMENTIA   History  Chief Complaint  Patient presents with   Loss of Consciousness    MORNA FLUD is a 88 y.o. female.  HPI 88 year old female presents with near-syncope. History is primarily from granddaughter over the phone. Was feeling faint and started getting nauseous a few hours ago. No fevers or recent illness. No other complaints. Similar to when she was seen on May 5/29. History is otherwise limited based on her history of dementia. But granddaughter notes she is at her mental baseline today.  Patient has no acute complaints but feels generally weak. No chest pain, headache, etc.  Home Medications Prior to Admission medications   Medication Sig Start Date End Date Taking? Authorizing Provider  albuterol (VENTOLIN HFA) 108 (90 Base) MCG/ACT inhaler Inhale 2 puffs into the lungs every 4 (four) hours as needed for wheezing or shortness of breath. 04/29/20   Medina-Vargas, Monina C, NP  busPIRone (BUSPAR) 5 MG tablet Take 1 tablet (5 mg total) by mouth 3 (three) times daily. 04/29/20   Medina-Vargas, Monina C, NP  donepezil (ARICEPT) 10 MG tablet Take 1 tablet (10 mg total) by mouth at bedtime. 09/05/20   Nita Sells, MD  escitalopram (LEXAPRO) 10 MG tablet Take 1 tablet (10 mg total) by mouth every evening. 09/05/20   Nita Sells, MD  feeding supplement (ENSURE ENLIVE / ENSURE PLUS) LIQD Take 237 mLs by mouth 2 (two) times daily between meals. 04/11/20   Pokhrel, Corrie Mckusick, MD  memantine (NAMENDA XR) 28 MG CP24 24 hr capsule Take 1 capsule (28 mg total) by mouth every morning. 09/05/20   Nita Sells, MD  ondansetron (ZOFRAN ODT) 4 MG disintegrating tablet 4mg  ODT q4 hours prn nausea/vomit 12/10/20   Mesner, Corene Cornea, MD  sodium zirconium cyclosilicate (LOKELMA) 10 g PACK packet Take 10 g by mouth 2 (two) times daily. 09/05/20    Nita Sells, MD      Allergies    Aspirin    Review of Systems   Review of Systems  Unable to perform ROS: Dementia    Physical Exam Updated Vital Signs BP (!) 150/64   Pulse (!) 56   Temp 97.9 F (36.6 C) (Oral)   Resp 18   SpO2 100%  Physical Exam Vitals and nursing note reviewed.  Constitutional:      Appearance: She is well-developed.  HENT:     Head: Normocephalic and atraumatic.  Eyes:     Pupils: Pupils are equal, round, and reactive to light.  Cardiovascular:     Rate and Rhythm: Normal rate and regular rhythm.     Heart sounds: Normal heart sounds.  Pulmonary:     Effort: Pulmonary effort is normal.     Breath sounds: Normal breath sounds.  Abdominal:     Palpations: Abdomen is soft.     Tenderness: There is no abdominal tenderness.  Skin:    General: Skin is warm and dry.  Neurological:     Mental Status: She is alert. She is disoriented.     Comments: Oriented to person, place, month but disoriented to year.  CN 3-12 grossly intact. 5/5 strength in all 4 extremities. Grossly normal sensation. Normal finger to nose.      ED Results / Procedures / Treatments   Labs (all labs ordered are listed, but only abnormal results are displayed) Labs Reviewed  CBC WITH DIFFERENTIAL/PLATELET - Abnormal; Notable for the following components:      Result Value   RBC 3.54 (*)    Hemoglobin 10.6 (*)    MCV 103.1 (*)    MCHC 29.0 (*)    All other components within normal limits  COMPREHENSIVE METABOLIC PANEL - Abnormal; Notable for the following components:   Chloride 112 (*)    CO2 21 (*)    Glucose, Bld 132 (*)    BUN 32 (*)    Creatinine, Ser 2.07 (*)    Albumin 3.3 (*)    AST 42 (*)    GFR, Estimated 23 (*)    All other components within normal limits  CBG MONITORING, ED - Abnormal; Notable for the following components:   Glucose-Capillary 151 (*)    All other components within normal limits  URINALYSIS, ROUTINE W REFLEX MICROSCOPIC   PROTIME-INR  APTT  CBG MONITORING, ED    EKG ED ECG REPORT   Date: 08/25/2021  Rate: 62  Rhythm:  paced rhythm  QRS Axis: normal  Intervals: normal  ST/T Wave abnormalities: normal  Conduction Disutrbances:none  Narrative Interpretation:   Old EKG Reviewed: unchanged  I have personally reviewed the EKG tracing and agree with the computerized printout as noted.   Radiology CT HEAD WO CONTRAST  Result Date: 08/25/2021 CLINICAL DATA:  Provided history: Head trauma, minor. Additional history provided: Syncope/fall. EXAM: CT HEAD WITHOUT CONTRAST TECHNIQUE: Contiguous axial images were obtained from the base of the skull through the vertex without intravenous contrast. RADIATION DOSE REDUCTION: This exam was performed according to the departmental dose-optimization program which includes automated exposure control, adjustment of the mA and/or kV according to patient size and/or use of iterative reconstruction technique. COMPARISON:  Prior head CT examinations 05/10/2019 and earlier. Brain MRI 11/22/2017. FINDINGS: Brain: Mild cerebral and cerebellar atrophy. 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 Partially empty sella turcica. There is no acute intracranial hemorrhage. No demarcated cortical infarct. No extra-axial fluid collection. No evidence of an intracranial mass. No midline shift. Vascular: No hyperdense vessel. Atherosclerotic calcifications. Skull: No fracture or aggressive osseous lesion. Sinuses/Orbits: No mass or acute finding within the imaged orbits. Nonspecific chronic calcifications along both optic nerve sheaths. Postsurgical appearance of the paranasal sinuses. Trace mucosal thickening within the frontoethmoidal recesses, bilaterally. Opacification of two left ethmoid air cells. Small-volume frothy secretions within the bilateral sphenoid sinuses. Minimal mucosal thickening within the maxillary sinuses at the imaged levels. IMPRESSION: No  evidence of acute intracranial abnormality. 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 Mild generalized parenchymal atrophy. Paranasal sinus disease, as described. Electronically Signed   By: Kellie Simmering D.O.   On: 08/25/2021 16:45   DG Chest 2 View  Result Date: 08/25/2021 CLINICAL DATA:  Syncope EXAM: CHEST - 2 VIEW COMPARISON:  Radiograph 08/11/2021 FINDINGS: Unchanged cardiomediastinal silhouette and dual chamber pacemaker leads. There is no focal airspace disease. There is no pleural effusion. No pneumothorax. Bilateral shoulder osteoarthritis. Thoracic spondylosis. No acute osseous abnormality. IMPRESSION: No evidence of acute cardiopulmonary disease. Electronically Signed   By: Maurine Simmering M.D.   On: 08/25/2021 15:38    Procedures Procedures    Medications Ordered in ED Medications  sodium chloride 0.9 % bolus 500 mL (500 mLs Intravenous New Bag/Given 08/25/21 2149)    ED Course/ Medical Decision Making/ A&P  Medical Decision Making Amount and/or Complexity of Data Reviewed Independent Historian: caregiver    Details: Granddaughter External Data Reviewed: notes. Labs: ordered. Radiology: ordered and independent interpretation performed. ECG/medicine tests: ordered and independent interpretation performed.   Patient presents with a recurrent episode of lightheadedness and some nausea but did not pass out.  Here the patient has a pretty benign exam.  She had some soft blood pressures when she first arrived but blood pressures have become more normal and even hypertensive since.  CT head was obtained in triage I personally viewed/interpret these images and there is no evidence of acute head bleed.  She does have a prior cerebellar infarct but I doubt acute.  She was able to get up and ambulate in the walker without issues.  Thus I doubt stroke.  Labs were obtained and she has chronic kidney disease that seems unchanged and  a chronic anemia.  Urinalysis was obtained, appears contaminated.  Given no acute urinary tract infection complaints, I think we can send for culture and if positive treat but I doubt this would cause transient lightheadedness.  ECG shows paced rhythm and we interrogated her [facility] pacemaker and there were no arrhythmia events.  I attempted to call daughter to inform her of discharge but she did not answer multiple attempts.  Otherwise appears stable for discharge.        Final Clinical Impression(s) / ED Diagnoses Final diagnoses:  Lightheadedness    Rx / DC Orders ED Discharge Orders     None         Sherwood Gambler, MD 08/25/21 2312

## 2021-08-25 NOTE — ED Triage Notes (Signed)
Pt with hx dementia from home with syncopal episode on the toilet today. Did not fall, no injury. Decreased PO intake over the last few days. Orthostatic changes from sitting to standing.

## 2021-08-26 DIAGNOSIS — G9341 Metabolic encephalopathy: Secondary | ICD-10-CM | POA: Diagnosis not present

## 2021-08-26 DIAGNOSIS — R4020 Unspecified coma: Secondary | ICD-10-CM | POA: Diagnosis not present

## 2021-08-26 DIAGNOSIS — Z8249 Family history of ischemic heart disease and other diseases of the circulatory system: Secondary | ICD-10-CM | POA: Diagnosis not present

## 2021-08-26 DIAGNOSIS — E1140 Type 2 diabetes mellitus with diabetic neuropathy, unspecified: Secondary | ICD-10-CM | POA: Diagnosis not present

## 2021-08-26 DIAGNOSIS — I499 Cardiac arrhythmia, unspecified: Secondary | ICD-10-CM | POA: Diagnosis not present

## 2021-08-26 DIAGNOSIS — Z87891 Personal history of nicotine dependence: Secondary | ICD-10-CM | POA: Diagnosis not present

## 2021-08-26 DIAGNOSIS — Z833 Family history of diabetes mellitus: Secondary | ICD-10-CM | POA: Diagnosis not present

## 2021-08-26 DIAGNOSIS — R569 Unspecified convulsions: Secondary | ICD-10-CM | POA: Diagnosis not present

## 2021-08-26 DIAGNOSIS — Z886 Allergy status to analgesic agent status: Secondary | ICD-10-CM | POA: Diagnosis not present

## 2021-08-26 DIAGNOSIS — E1122 Type 2 diabetes mellitus with diabetic chronic kidney disease: Secondary | ICD-10-CM | POA: Diagnosis not present

## 2021-08-26 DIAGNOSIS — R6889 Other general symptoms and signs: Secondary | ICD-10-CM | POA: Diagnosis not present

## 2021-08-26 DIAGNOSIS — Z933 Colostomy status: Secondary | ICD-10-CM | POA: Diagnosis not present

## 2021-08-26 DIAGNOSIS — F0390 Unspecified dementia without behavioral disturbance: Secondary | ICD-10-CM | POA: Diagnosis not present

## 2021-08-26 DIAGNOSIS — I10 Essential (primary) hypertension: Secondary | ICD-10-CM | POA: Diagnosis not present

## 2021-08-26 DIAGNOSIS — Z981 Arthrodesis status: Secondary | ICD-10-CM | POA: Diagnosis not present

## 2021-08-26 DIAGNOSIS — Z743 Need for continuous supervision: Secondary | ICD-10-CM | POA: Diagnosis not present

## 2021-08-26 DIAGNOSIS — Z79899 Other long term (current) drug therapy: Secondary | ICD-10-CM | POA: Diagnosis not present

## 2021-08-26 DIAGNOSIS — R279 Unspecified lack of coordination: Secondary | ICD-10-CM | POA: Diagnosis not present

## 2021-08-26 DIAGNOSIS — Z66 Do not resuscitate: Secondary | ICD-10-CM | POA: Diagnosis not present

## 2021-08-26 DIAGNOSIS — Z950 Presence of cardiac pacemaker: Secondary | ICD-10-CM | POA: Diagnosis not present

## 2021-08-26 DIAGNOSIS — Z7983 Long term (current) use of bisphosphonates: Secondary | ICD-10-CM | POA: Diagnosis not present

## 2021-08-26 DIAGNOSIS — N1832 Chronic kidney disease, stage 3b: Secondary | ICD-10-CM | POA: Diagnosis not present

## 2021-08-26 DIAGNOSIS — I959 Hypotension, unspecified: Secondary | ICD-10-CM | POA: Diagnosis not present

## 2021-08-26 DIAGNOSIS — I5032 Chronic diastolic (congestive) heart failure: Secondary | ICD-10-CM | POA: Diagnosis not present

## 2021-08-26 DIAGNOSIS — J449 Chronic obstructive pulmonary disease, unspecified: Secondary | ICD-10-CM | POA: Diagnosis not present

## 2021-08-26 DIAGNOSIS — R404 Transient alteration of awareness: Secondary | ICD-10-CM | POA: Diagnosis not present

## 2021-08-26 DIAGNOSIS — R55 Syncope and collapse: Secondary | ICD-10-CM | POA: Diagnosis not present

## 2021-08-26 DIAGNOSIS — Z841 Family history of disorders of kidney and ureter: Secondary | ICD-10-CM | POA: Diagnosis not present

## 2021-08-26 DIAGNOSIS — I130 Hypertensive heart and chronic kidney disease with heart failure and stage 1 through stage 4 chronic kidney disease, or unspecified chronic kidney disease: Secondary | ICD-10-CM | POA: Diagnosis not present

## 2021-08-26 NOTE — ED Notes (Signed)
[facility] arrived for transport 

## 2021-08-26 NOTE — ED Notes (Signed)
Daughter [name] called and did answer and made aware they were en route with pt

## 2021-08-27 ENCOUNTER — Inpatient Hospital Stay (HOSPITAL_COMMUNITY)
Admission: EM | Admit: 2021-08-27 | Discharge: 2021-08-30 | DRG: 071 | Disposition: A | Discharge status: Home / Self Care | Payer: Medicare Other | Attending: Internal Medicine | Admitting: Internal Medicine

## 2021-08-27 ENCOUNTER — Encounter (HOSPITAL_COMMUNITY): Payer: Self-pay

## 2021-08-27 ENCOUNTER — Emergency Department (HOSPITAL_COMMUNITY): Payer: Medicare Other

## 2021-08-27 DIAGNOSIS — I130 Hypertensive heart and chronic kidney disease with heart failure and stage 1 through stage 4 chronic kidney disease, or unspecified chronic kidney disease: Secondary | ICD-10-CM | POA: Diagnosis present

## 2021-08-27 DIAGNOSIS — Z87891 Personal history of nicotine dependence: Secondary | ICD-10-CM

## 2021-08-27 DIAGNOSIS — Z886 Allergy status to analgesic agent status: Secondary | ICD-10-CM

## 2021-08-27 DIAGNOSIS — I5032 Chronic diastolic (congestive) heart failure: Secondary | ICD-10-CM | POA: Diagnosis present

## 2021-08-27 DIAGNOSIS — Z841 Family history of disorders of kidney and ureter: Secondary | ICD-10-CM

## 2021-08-27 DIAGNOSIS — Z933 Colostomy status: Secondary | ICD-10-CM

## 2021-08-27 DIAGNOSIS — G9341 Metabolic encephalopathy: Principal | ICD-10-CM | POA: Diagnosis present

## 2021-08-27 DIAGNOSIS — Z7983 Long term (current) use of bisphosphonates: Secondary | ICD-10-CM

## 2021-08-27 DIAGNOSIS — Z66 Do not resuscitate: Secondary | ICD-10-CM | POA: Diagnosis present

## 2021-08-27 DIAGNOSIS — E1122 Type 2 diabetes mellitus with diabetic chronic kidney disease: Secondary | ICD-10-CM | POA: Diagnosis present

## 2021-08-27 DIAGNOSIS — J4489 Other specified chronic obstructive pulmonary disease: Secondary | ICD-10-CM | POA: Diagnosis present

## 2021-08-27 DIAGNOSIS — Z79899 Other long term (current) drug therapy: Secondary | ICD-10-CM

## 2021-08-27 DIAGNOSIS — E43 Unspecified severe protein-calorie malnutrition: Secondary | ICD-10-CM

## 2021-08-27 DIAGNOSIS — F0390 Unspecified dementia without behavioral disturbance: Secondary | ICD-10-CM | POA: Diagnosis present

## 2021-08-27 DIAGNOSIS — R404 Transient alteration of awareness: Secondary | ICD-10-CM | POA: Diagnosis present

## 2021-08-27 DIAGNOSIS — E1140 Type 2 diabetes mellitus with diabetic neuropathy, unspecified: Secondary | ICD-10-CM | POA: Diagnosis present

## 2021-08-27 DIAGNOSIS — Z833 Family history of diabetes mellitus: Secondary | ICD-10-CM

## 2021-08-27 DIAGNOSIS — Z8249 Family history of ischemic heart disease and other diseases of the circulatory system: Secondary | ICD-10-CM

## 2021-08-27 DIAGNOSIS — R4020 Unspecified coma: Secondary | ICD-10-CM | POA: Diagnosis present

## 2021-08-27 DIAGNOSIS — N1832 Chronic kidney disease, stage 3b: Secondary | ICD-10-CM | POA: Diagnosis present

## 2021-08-27 DIAGNOSIS — R55 Syncope and collapse: Secondary | ICD-10-CM | POA: Diagnosis present

## 2021-08-27 DIAGNOSIS — E440 Moderate protein-calorie malnutrition: Secondary | ICD-10-CM

## 2021-08-27 DIAGNOSIS — J449 Chronic obstructive pulmonary disease, unspecified: Secondary | ICD-10-CM | POA: Diagnosis present

## 2021-08-27 DIAGNOSIS — Z981 Arthrodesis status: Secondary | ICD-10-CM

## 2021-08-27 DIAGNOSIS — Z950 Presence of cardiac pacemaker: Secondary | ICD-10-CM

## 2021-08-27 DIAGNOSIS — I10 Essential (primary) hypertension: Secondary | ICD-10-CM | POA: Diagnosis present

## 2021-08-27 DIAGNOSIS — R569 Unspecified convulsions: Secondary | ICD-10-CM | POA: Diagnosis present

## 2021-08-27 LAB — URINE CULTURE

## 2021-08-27 LAB — CBC WITH DIFFERENTIAL/PLATELET
Abs Immature Granulocytes: 0.02 K/uL (ref 0.00–0.07)
Basophils Absolute: 0 K/uL (ref 0.0–0.1)
Basophils Relative: 0 %
Eosinophils Absolute: 0.1 K/uL (ref 0.0–0.5)
Eosinophils Relative: 1 %
HCT: 32.5 % — ABNORMAL LOW (ref 36.0–46.0)
Hemoglobin: 9.8 g/dL — ABNORMAL LOW (ref 12.0–15.0)
Immature Granulocytes: 0 %
Lymphocytes Relative: 14 %
Lymphs Abs: 1 K/uL (ref 0.7–4.0)
MCH: 29.6 pg (ref 26.0–34.0)
MCHC: 30.2 g/dL (ref 30.0–36.0)
MCV: 98.2 fL (ref 80.0–100.0)
Monocytes Absolute: 0.3 K/uL (ref 0.1–1.0)
Monocytes Relative: 4 %
Neutro Abs: 5.7 K/uL (ref 1.7–7.7)
Neutrophils Relative %: 81 %
Platelets: 146 K/uL — ABNORMAL LOW (ref 150–400)
RBC: 3.31 MIL/uL — ABNORMAL LOW (ref 3.87–5.11)
RDW: 12.9 % (ref 11.5–15.5)
WBC: 7 K/uL (ref 4.0–10.5)
nRBC: 0 % (ref 0.0–0.2)

## 2021-08-27 LAB — BASIC METABOLIC PANEL WITH GFR
Anion gap: 3 — ABNORMAL LOW (ref 5–15)
BUN: 27 mg/dL — ABNORMAL HIGH (ref 8–23)
CO2: 21 mmol/L — ABNORMAL LOW (ref 22–32)
Calcium: 6.7 mg/dL — ABNORMAL LOW (ref 8.9–10.3)
Chloride: 117 mmol/L — ABNORMAL HIGH (ref 98–111)
Creatinine, Ser: 1.2 mg/dL — ABNORMAL HIGH (ref 0.44–1.00)
GFR, Estimated: 44 mL/min — ABNORMAL LOW
Glucose, Bld: 153 mg/dL — ABNORMAL HIGH (ref 70–99)
Potassium: 4 mmol/L (ref 3.5–5.1)
Sodium: 141 mmol/L (ref 135–145)

## 2021-08-27 LAB — CBG MONITORING, ED: Glucose-Capillary: 175 mg/dL — ABNORMAL HIGH (ref 70–99)

## 2021-08-27 LAB — TROPONIN I (HIGH SENSITIVITY): Troponin I (High Sensitivity): 3 ng/L

## 2021-08-27 NOTE — ED Notes (Signed)
[facility] called back and said that the pacemaker was working fine and they didn't see an arrhythmias

## 2021-08-27 NOTE — ED Provider Notes (Signed)
[facility] DEPT Provider Note   CSN: [id number] Arrival date & time: 08/27/21  1719     History  Chief Complaint  Patient presents with   Loss of Consciousness    [name] is a 88 y.o. female.  Presented to the emergency room due to concern for syncopal episode at home.  Family reported episode of syncope prior to arrival, EMS report additional episode of syncope.  They reported low blood pressure initially but this improved after small fluids.  Here patient is asymptomatic.  I obtained additional history from patient's granddaughter over the phone.  She reports that patient had episode of passing out while at the dinner table.  Eating early dinner around 4 PM.  Patient felt suddenly lightheaded and notified her went unconscious briefly.  No trauma.  Similar episode occurred 2 days ago and was seen in the ER.  Also had similar episode on 5/29.  Reviewed last cardiology note, patient has history of symptomatic bradycardia and has undergone [place] pacemaker placement in 2020.  HPI     Home Medications Prior to Admission medications   Medication Sig Start Date End Date Taking? Authorizing Provider  acetaminophen (TYLENOL) 500 MG tablet Take 500 mg by mouth every 6 (six) hours as needed for moderate pain.   Yes [provider]  albuterol (VENTOLIN HFA) 108 (90 Base) MCG/ACT inhaler Inhale 2 puffs into the lungs every 4 (four) hours as needed for wheezing or shortness of breath. 04/29/20  Yes Medina-Vargas, Monina C, NP  alendronate (FOSAMAX) 70 MG tablet Take 70 mg by mouth once a week. Sundays 05/30/21  Yes [provider]  busPIRone (BUSPAR) 5 MG tablet Take 1 tablet (5 mg total) by mouth 3 (three) times daily. 04/29/20  Yes Medina-Vargas, Monina C, NP  donepezil (ARICEPT) 10 MG tablet Take 1 tablet (10 mg total) by mouth at bedtime. 09/05/20  Yes Nita Sells, MD  loratadine (CLARITIN) 10 MG tablet Take 10 mg by mouth daily  as needed for allergies.   Yes [provider]  memantine (NAMENDA XR) 28 MG CP24 24 hr capsule Take 1 capsule (28 mg total) by mouth every morning. 09/05/20  Yes Nita Sells, MD  ondansetron (ZOFRAN ODT) 4 MG disintegrating tablet 4mg  ODT q4 hours prn nausea/vomit Patient taking differently: Take 4 mg by mouth every 8 (eight) hours as needed for nausea or vomiting. 12/10/20  Yes Mesner, Corene Cornea, MD  pravastatin (PRAVACHOL) 10 MG tablet Take 10 mg by mouth at bedtime. 08/04/21  Yes [provider]  escitalopram (LEXAPRO) 10 MG tablet Take 1 tablet (10 mg total) by mouth every evening. Patient not taking: Reported on 08/27/2021 09/05/20   Nita Sells, MD  feeding supplement (ENSURE ENLIVE / ENSURE PLUS) LIQD Take 237 mLs by mouth 2 (two) times daily between meals. Patient not taking: Reported on 08/27/2021 04/11/20   Flora Lipps, MD  sodium zirconium cyclosilicate (LOKELMA) 10 g PACK packet Take 10 g by mouth 2 (two) times daily. Patient not taking: Reported on 08/27/2021 09/05/20   Nita Sells, MD      Allergies    Aspirin    Review of Systems   Review of Systems  Constitutional:  Negative for chills and fever.  HENT:  Negative for ear pain and sore throat.   Eyes:  Negative for pain and visual disturbance.  Respiratory:  Negative for cough and shortness of breath.   Cardiovascular:  Negative for chest pain and palpitations.  Gastrointestinal:  Negative  for abdominal pain and vomiting.  Genitourinary:  Negative for dysuria and hematuria.  Musculoskeletal:  Negative for arthralgias and back pain.  Skin:  Negative for color change and rash.  Neurological:  Positive for syncope. Negative for tremors and seizures.  All other systems reviewed and are negative.   Physical Exam Updated Vital Signs BP (!) 129/58   Pulse (!) 37   Temp 97.6 F (36.4 C) (Oral)   Resp 18   SpO2 100%  Physical Exam Vitals and nursing note reviewed.  Constitutional:       General: She is not in acute distress.    Appearance: She is well-developed.  HENT:     Head: Normocephalic and atraumatic.  Eyes:     Conjunctiva/sclera: Conjunctivae normal.  Cardiovascular:     Rate and Rhythm: Normal rate and regular rhythm.     Heart sounds: No murmur heard. Pulmonary:     Effort: Pulmonary effort is normal. No respiratory distress.     Breath sounds: Normal breath sounds.  Abdominal:     Palpations: Abdomen is soft.     Tenderness: There is no abdominal tenderness.  Musculoskeletal:        General: No swelling.     Cervical back: Neck supple.  Skin:    General: Skin is warm and dry.     Capillary Refill: Capillary refill takes less than 2 seconds.  Neurological:     Mental Status: She is alert.  Psychiatric:        Mood and Affect: Mood normal.     ED Results / Procedures / Treatments   Labs (all labs ordered are listed, but only abnormal results are displayed) Labs Reviewed  BASIC METABOLIC PANEL - Abnormal; Notable for the following components:      Result Value   Chloride 117 (*)    CO2 21 (*)    Glucose, Bld 153 (*)    BUN 27 (*)    Creatinine, Ser 1.20 (*)    Calcium 6.7 (*)    GFR, Estimated 44 (*)    Anion gap 3 (*)    All other components within normal limits  CBC WITH DIFFERENTIAL/PLATELET - Abnormal; Notable for the following components:   RBC 3.31 (*)    Hemoglobin 9.8 (*)    HCT 32.5 (*)    Platelets 146 (*)    All other components within normal limits  CBG MONITORING, ED - Abnormal; Notable for the following components:   Glucose-Capillary 175 (*)    All other components within normal limits  TROPONIN I (HIGH SENSITIVITY)  TROPONIN I (HIGH SENSITIVITY)    EKG EKG Interpretation  Date/Time:  Wednesday August 27 2021 17:33:48 EDT Ventricular Rate:  60 PR Interval:  238 QRS Duration: 100 QT Interval:  421 QTC Calculation: 421 R Axis:   -52 Text Interpretation: Atrial-paced rhythm Left anterior fascicular block  Confirmed by Madalyn Rob [phone]) on 08/27/2021 8:20:24 PM  Radiology DG Chest 2 View  Result Date: 08/27/2021 CLINICAL DATA:  Syncopal episode with hypotension. EXAM: CHEST - 2 VIEW COMPARISON:  PA Lat 08/25/2021 FINDINGS: There is mild cardiomegaly without evidence of CHF. There is aortic tortuosity and atherosclerosis with stable mediastinal configuration. The lungs are slightly emphysematous but clear. There are multiple overlying monitor wires. Left chest dual lead pacing system and wire insertions are unaltered. Osteopenia and thoracic spondylosis. Bilateral shoulder DJD. IMPRESSION: No evidence of acute chest disease. Stable COPD chest with cardiomegaly. Aortic atherosclerosis. Electronically Signed   By: Lanny Hurst  Chesser M.D.   On: 08/27/2021 21:58    Procedures Procedures    Medications Ordered in ED Medications - No data to display  ED Course/ Medical Decision Making/ A&P                           Medical Decision Making Amount and/or Complexity of Data Reviewed Labs: ordered. Radiology: ordered.  Risk Decision regarding hospitalization.   88 year old lady with history of symptomatic bradycardia s/p pacemaker placement presenting for recurrent syncopal episodes.  Had 1 episode at home and then EMS report second episode this evening while in route here.  Reviewed recent ER visit on 6/12, having near syncopal episodes.  Basic labs reviewed, no electrolyte derangement or anemia.  EKG unchanged from prior.  Pacemaker interrogated, functioning appropriately, no events noted.  Reviewed case with patient's granddaughter.  And obtain additional history from chart review and discussion with family members.  Patient has difficult time recalling these events.  Given patient's recurrent syncopal episodes without clear explanation, feel she would benefit from admission this evening for observation.  Will consult hospitalist.        Final Clinical Impression(s) / ED Diagnoses Final  diagnoses:  Syncope and collapse    Rx / DC Orders ED Discharge Orders     None         Lucrezia Starch, MD 08/27/21 2250

## 2021-08-27 NOTE — ED Triage Notes (Signed)
Pt arrives via [facility] from home for syncopal episode. Pt had syncopal episode at home with grandaughter, and then again with EMS. Initial BP 89/47. Improved to 129/63 after 150 mL NS BOLUS. EMS reports pt's pacemaker is pacing at a rate of 60.

## 2021-08-28 ENCOUNTER — Other Ambulatory Visit: Payer: Self-pay

## 2021-08-28 ENCOUNTER — Observation Stay (HOSPITAL_COMMUNITY)
Admit: 2021-08-28 | Discharge: 2021-08-28 | Disposition: A | Discharge status: Home / Self Care | Payer: Medicare Other | Attending: Family Medicine | Admitting: Family Medicine

## 2021-08-28 ENCOUNTER — Encounter (HOSPITAL_COMMUNITY): Payer: Self-pay | Admitting: Internal Medicine

## 2021-08-28 ENCOUNTER — Observation Stay (HOSPITAL_BASED_OUTPATIENT_CLINIC_OR_DEPARTMENT_OTHER): Payer: Medicare Other

## 2021-08-28 DIAGNOSIS — R404 Transient alteration of awareness: Secondary | ICD-10-CM

## 2021-08-28 DIAGNOSIS — E1122 Type 2 diabetes mellitus with diabetic chronic kidney disease: Secondary | ICD-10-CM | POA: Diagnosis not present

## 2021-08-28 DIAGNOSIS — I5032 Chronic diastolic (congestive) heart failure: Secondary | ICD-10-CM | POA: Diagnosis present

## 2021-08-28 DIAGNOSIS — J449 Chronic obstructive pulmonary disease, unspecified: Secondary | ICD-10-CM

## 2021-08-28 DIAGNOSIS — I10 Essential (primary) hypertension: Secondary | ICD-10-CM

## 2021-08-28 DIAGNOSIS — Z933 Colostomy status: Secondary | ICD-10-CM | POA: Diagnosis not present

## 2021-08-28 DIAGNOSIS — R55 Syncope and collapse: Secondary | ICD-10-CM

## 2021-08-28 DIAGNOSIS — Z79899 Other long term (current) drug therapy: Secondary | ICD-10-CM | POA: Diagnosis not present

## 2021-08-28 DIAGNOSIS — Z7983 Long term (current) use of bisphosphonates: Secondary | ICD-10-CM | POA: Diagnosis not present

## 2021-08-28 DIAGNOSIS — Z841 Family history of disorders of kidney and ureter: Secondary | ICD-10-CM | POA: Diagnosis not present

## 2021-08-28 DIAGNOSIS — Z950 Presence of cardiac pacemaker: Secondary | ICD-10-CM | POA: Diagnosis not present

## 2021-08-28 DIAGNOSIS — Z87891 Personal history of nicotine dependence: Secondary | ICD-10-CM | POA: Diagnosis not present

## 2021-08-28 DIAGNOSIS — Z886 Allergy status to analgesic agent status: Secondary | ICD-10-CM | POA: Diagnosis not present

## 2021-08-28 DIAGNOSIS — N1832 Chronic kidney disease, stage 3b: Secondary | ICD-10-CM

## 2021-08-28 DIAGNOSIS — F0390 Unspecified dementia without behavioral disturbance: Secondary | ICD-10-CM | POA: Diagnosis not present

## 2021-08-28 DIAGNOSIS — E1140 Type 2 diabetes mellitus with diabetic neuropathy, unspecified: Secondary | ICD-10-CM | POA: Diagnosis not present

## 2021-08-28 DIAGNOSIS — Z981 Arthrodesis status: Secondary | ICD-10-CM | POA: Diagnosis not present

## 2021-08-28 DIAGNOSIS — Z8249 Family history of ischemic heart disease and other diseases of the circulatory system: Secondary | ICD-10-CM | POA: Diagnosis not present

## 2021-08-28 DIAGNOSIS — Z66 Do not resuscitate: Secondary | ICD-10-CM | POA: Diagnosis not present

## 2021-08-28 DIAGNOSIS — R569 Unspecified convulsions: Secondary | ICD-10-CM | POA: Diagnosis not present

## 2021-08-28 DIAGNOSIS — I130 Hypertensive heart and chronic kidney disease with heart failure and stage 1 through stage 4 chronic kidney disease, or unspecified chronic kidney disease: Secondary | ICD-10-CM | POA: Diagnosis not present

## 2021-08-28 DIAGNOSIS — G9341 Metabolic encephalopathy: Secondary | ICD-10-CM | POA: Diagnosis not present

## 2021-08-28 DIAGNOSIS — Z833 Family history of diabetes mellitus: Secondary | ICD-10-CM | POA: Diagnosis not present

## 2021-08-28 LAB — CBC WITH DIFFERENTIAL/PLATELET
Abs Immature Granulocytes: 0.02 10*3/uL (ref 0.00–0.07)
Basophils Absolute: 0 10*3/uL (ref 0.0–0.1)
Basophils Relative: 1 %
Eosinophils Absolute: 0.1 10*3/uL (ref 0.0–0.5)
Eosinophils Relative: 2 %
HCT: 34.3 % — ABNORMAL LOW (ref 36.0–46.0)
Hemoglobin: 10.9 g/dL — ABNORMAL LOW (ref 12.0–15.0)
Immature Granulocytes: 0 %
Lymphocytes Relative: 30 %
Lymphs Abs: 2.1 10*3/uL (ref 0.7–4.0)
MCH: 30.4 pg (ref 26.0–34.0)
MCHC: 31.8 g/dL (ref 30.0–36.0)
MCV: 95.8 fL (ref 80.0–100.0)
Monocytes Absolute: 0.4 10*3/uL (ref 0.1–1.0)
Monocytes Relative: 6 %
Neutro Abs: 4.3 10*3/uL (ref 1.7–7.7)
Neutrophils Relative %: 61 %
Platelets: 189 10*3/uL (ref 150–400)
RBC: 3.58 MIL/uL — ABNORMAL LOW (ref 3.87–5.11)
RDW: 12.9 % (ref 11.5–15.5)
WBC: 7 10*3/uL (ref 4.0–10.5)
nRBC: 0 % (ref 0.0–0.2)

## 2021-08-28 LAB — COMPREHENSIVE METABOLIC PANEL
ALT: 13 U/L (ref 0–44)
AST: 16 U/L (ref 15–41)
Albumin: 3.4 g/dL — ABNORMAL LOW (ref 3.5–5.0)
Alkaline Phosphatase: 59 U/L (ref 38–126)
Anion gap: 5 (ref 5–15)
BUN: 36 mg/dL — ABNORMAL HIGH (ref 8–23)
CO2: 28 mmol/L (ref 22–32)
Calcium: 9.3 mg/dL (ref 8.9–10.3)
Chloride: 109 mmol/L (ref 98–111)
Creatinine, Ser: 1.79 mg/dL — ABNORMAL HIGH (ref 0.44–1.00)
GFR, Estimated: 27 mL/min — ABNORMAL LOW (ref >60)
Glucose, Bld: 98 mg/dL (ref 70–99)
Potassium: 4.9 mmol/L (ref 3.5–5.1)
Sodium: 142 mmol/L (ref 135–145)
Total Bilirubin: 0.5 mg/dL (ref 0.3–1.2)
Total Protein: 6.8 g/dL (ref 6.5–8.1)

## 2021-08-28 LAB — TROPONIN I (HIGH SENSITIVITY): Troponin I (High Sensitivity): 5 ng/L (ref <18)

## 2021-08-28 LAB — ECHOCARDIOGRAM COMPLETE
AR max vel: 1.74 cm2
AV Peak grad: 6.8 mmHg
Ao pk vel: 1.3 m/s
Area-P 1/2: 2.34 cm2
P 1/2 time: 905 msec
S' Lateral: 2.3 cm

## 2021-08-28 LAB — MAGNESIUM: Magnesium: 1.9 mg/dL (ref 1.7–2.4)

## 2021-08-28 MED ORDER — ACETAMINOPHEN 325 MG PO TABS: 650.0000 mg | ORAL_TABLET | Freq: Four times a day (QID) | ORAL | Status: DC | PRN

## 2021-08-28 MED ORDER — ALBUTEROL SULFATE (2.5 MG/3ML) 0.083% IN NEBU: 2.5000 mg | INHALATION_SOLUTION | RESPIRATORY_TRACT | Status: DC | PRN

## 2021-08-28 MED ORDER — LACTATED RINGERS IV SOLN: INTRAVENOUS | Status: AC

## 2021-08-28 MED ORDER — HYDRALAZINE HCL 20 MG/ML IJ SOLN: 10.0000 mg | Freq: Four times a day (QID) | INTRAMUSCULAR | Status: DC | PRN

## 2021-08-28 MED ORDER — PRAVASTATIN SODIUM 10 MG PO TABS
10.0000 mg | ORAL_TABLET | Freq: Every day | ORAL | Status: DC
  Administered 2021-08-28 – 2021-08-30 (×3): 10 mg via ORAL
  Filled 2021-08-28 (×3): qty 1

## 2021-08-28 MED ORDER — ONDANSETRON HCL 4 MG PO TABS: 4.0000 mg | ORAL_TABLET | Freq: Four times a day (QID) | ORAL | Status: DC | PRN

## 2021-08-28 MED ORDER — POLYETHYLENE GLYCOL 3350 17 G PO PACK
17.0000 g | PACK | Freq: Every day | ORAL | Status: DC | PRN
Start: 2021-08-28 — End: 2021-08-31

## 2021-08-28 MED ORDER — ACETAMINOPHEN 650 MG RE SUPP: 650.0000 mg | Freq: Four times a day (QID) | RECTAL | Status: DC | PRN

## 2021-08-28 MED ORDER — ENOXAPARIN SODIUM 30 MG/0.3ML IJ SOSY
30.0000 mg | PREFILLED_SYRINGE | INTRAMUSCULAR | Status: DC
  Administered 2021-08-28 – 2021-08-30 (×3): 30 mg via SUBCUTANEOUS
  Filled 2021-08-28 (×3): qty 0.3

## 2021-08-28 MED ORDER — DONEPEZIL HCL 5 MG PO TABS
10.0000 mg | ORAL_TABLET | Freq: Every day | ORAL | Status: DC
  Administered 2021-08-28 – 2021-08-30 (×3): 10 mg via ORAL
  Filled 2021-08-28 (×3): qty 2

## 2021-08-28 MED ORDER — ORAL CARE MOUTH RINSE: 15.0000 mL | OROMUCOSAL | Status: DC | PRN

## 2021-08-28 MED ORDER — BUSPIRONE HCL 5 MG PO TABS
5.0000 mg | ORAL_TABLET | Freq: Three times a day (TID) | ORAL | Status: DC
  Administered 2021-08-28 – 2021-08-30 (×9): 5 mg via ORAL
  Filled 2021-08-28 (×9): qty 1

## 2021-08-28 MED ORDER — ONDANSETRON HCL 4 MG/2ML IJ SOLN: 4.0000 mg | Freq: Four times a day (QID) | INTRAMUSCULAR | Status: DC | PRN

## 2021-08-28 MED ORDER — LORATADINE 10 MG PO TABS
10.0000 mg | ORAL_TABLET | Freq: Every day | ORAL | Status: DC | PRN
Start: 2021-08-28 — End: 2021-08-31

## 2021-08-28 MED ORDER — MEMANTINE HCL ER 28 MG PO CP24
28.0000 mg | ORAL_CAPSULE | Freq: Every morning | ORAL | Status: DC
  Administered 2021-08-28 – 2021-08-30 (×3): 28 mg via ORAL
  Filled 2021-08-28 (×4): qty 1

## 2021-08-28 NOTE — Assessment & Plan Note (Signed)
   While patient has a documented history of hypertension, patient is currently not on antihypertensives per review of home medication regimen  As needed intravenous antihypertensives for markedly elevated blood pressure.

## 2021-08-28 NOTE — Assessment & Plan Note (Signed)
Longstanding known history of dementia and cognitive deficit Minimizing uncomfortable stimuli Minimizing mood altering and sedating agents Frequent redirection by staff Fall precautions  

## 2021-08-28 NOTE — Assessment & Plan Note (Signed)
   Patient has been exhibiting episodes of decreased responsiveness for the past several weeks of unknown etiology  Unclear as to whether these episodes syncope or type of seizure  Case briefly discussed with Dr. Cheral Marker with neurology who recommends transfer to [facility] for consideration of continuous EEG monitoring.  Neurology will need to be formally consulted upon arrival to [facility].   With patient's history of symptomatic bradycardia, patient's pacemaker has been interrogated multiple times and has been proven to be functioning normally.  Echocardiogram ordered for the morning  Patient has not exhibited any evidence of hypotension or evidence of underlying infection  Monitoring patient on telemetry  Troponins unremarkable thus far  Reviewing home medications  Monitoring electrolytes  Gentle intravenous hydration

## 2021-08-28 NOTE — Procedures (Signed)
Patient Name: [name]  MRN: [id number]  Epilepsy Attending: Lora Havens  Referring Physician/Provider: Vernelle Emerald, MD  Date: 08/28/2021 Duration: 24.50 mins  Patient history: 88yo F with episodes of decreased responsiveness for the past several weeks of unknown etiology. EEG to evaluate for seizure.  Level of alertness: Awake, asleep  AEDs during EEG study: None  Technical aspects: This EEG study was done with scalp electrodes positioned according to the 10-20 International system of electrode placement. Electrical activity was acquired at a sampling rate of 500Hz  and reviewed with a high frequency filter of 70Hz  and a low frequency filter of 1Hz . EEG data were recorded continuously and digitally stored.   Description: The posterior dominant rhythm consists of 9-10 Hz activity of moderate voltage (25-35 uV) seen predominantly in posterior head regions, symmetric and reactive to eye opening and eye closing. Sleep was characterized by vertex waves, sleep spindles (12 to 14 Hz), maximal frontocentral region. Hyperventilation and photic stimulation were not performed.     IMPRESSION: This study is within normal limits. No seizures or epileptiform discharges were seen throughout the recording.  Shila Kruczek [name]

## 2021-08-28 NOTE — Assessment & Plan Note (Signed)
No clinical evidence of cardiogenic volume overload Strict input and output monitoring Daily weights Low-sodium diet  

## 2021-08-28 NOTE — Care Plan (Signed)
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  Patient was admitted due to unexplained recurrent bouts of decreased responsiveness / loss of consciousness.  Unclear whether these episodes are syncope or type of seizure.  Patient was evaluated by neurology Dr. Cheral Marker recommended admission at [facility] for continuous EEG monitoring.  Patient's pacemaker has been recently interrogated and appears to be functioning normally. Patient was seen and examined at bedside.

## 2021-08-28 NOTE — Assessment & Plan Note (Signed)
Strict intake and output monitoring Creatinine near baseline Minimizing nephrotoxic agents as much as possible Serial chemistries to monitor renal function and electrolytes  

## 2021-08-28 NOTE — Progress Notes (Signed)
  Pt's granddaughter, Ms. [name] Lemberger called by phone with concerning of Pt's neurological status. Update was given. She will visit tomorrow morning and prefer to discuss the treatment plan with the providers.   Pt is alert, oriented x 3, forgetful. She repeatedly asks same questions. She is cooperative with care, able to follow commands. Hemodynamically stable, afebrile. Denies pain. BP 160/69- 180/82 mmHg. EKG is Atrial paced on the monitor. HR 60s. No acute distress noted.  We will continue to monitor.  Kennyth Lose, RN

## 2021-08-28 NOTE — Progress Notes (Signed)
EEG complete - results pending 

## 2021-08-28 NOTE — H&P (Signed)
History and Physical    Patient: [name] MRN: [id number] DOA: 08/27/2021  Date of Service: the patient was seen and examined on 08/28/2021  Patient coming from: Home via EMS  Chief Complaint:  Chief Complaint  Patient presents with   Loss of Consciousness    HPI:   88 year old female with past medical history of COPD with asthma, symptomatic bradycardia status post [facility] pacemaker 04/2018 (by Dr. 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  Patient is a poor historian considering known history of dementia.  Majority the history has been obtained from discussions with the emergency department staff and information provided by family.  Since late May the patient has been experiencing episodes of unresponsiveness.  Many times, these episodes are witnessed by family.  On 5/29 after an episode of unresponsiveness EMS was contacted.  Upon arrival patient was found to be hypotensive and after administration of intravenous fluids the patient was discharged home.  Patient then returned again to the hospital on 6/12 due to repeated episodes of loss of consciousness or decreased responsiveness.  We have sent on 6/12 occurred while the patient was sitting on the toilet.  Patient's device was interrogated and was found to be functioning properly.  Patient was once again discharged home.  The evening of 6/14 patient exhibited he had another episode of observed loss of consciousness according to family.  Family explains that while eating an early dinner at approximately 4 PM patient went briefly unconscious.  Patient did not strike her head or exhibit any obvious seizure activity, tongue biting self urination or defecation.  EMS was  contacted yet again the patient was brought into [facility] emergency department for repeat evaluation.  Upon evaluation in the emergency department device was interrogated again and was found to be functioning properly.  Due to unexplained recurrent bouts of decreased responsiveness/loss of consciousness the hospitalist group was then called to assess the patient for admission to the hospital.    Review of Systems: Review of Systems  Unable to perform ROS: Dementia     Past Medical History:  Diagnosis Date   Asthma    CKD (chronic kidney disease) stage 3, GFR 30-59 ml/min ([place]) 05/01/2018   Colostomy present ([place])    hx/notes 07/17/2010   Dementia ([place])    Diabetic neuropathy ([place])    Hypertension    hx/notes 07/17/2010   Pneumonia    recent/notes 10/08/2016   Symptomatic bradycardia 12/15/2017   Type II diabetes mellitus ([place])    hx/notes 07/17/2010    Past Surgical History:  Procedure Laterality Date   ABDOMINAL HYSTERECTOMY     COLOSTOMY     S/P SBO hx/notes 07/17/2010   LUMBAR FUSION  11/2006   hx/notes 07/17/2010   NASAL SINUS SURGERY  04/28/2002   Bilateral inferior turbinate reductions, bilateral maxillary antrotomies, bilateral total ethmoidectomies, bilateral frontal recess explorations, bilateral sphenoidotomies, [employer] guidance./notes 07/28/2010   PACEMAKER IMPLANT N/A 05/02/2018   Procedure: PACEMAKER IMPLANT;  Surgeon: Evans Lance, MD;  Location: [place] [facility];  Service: Cardiovascular;  Laterality: N/A;    Social History:  reports that she has never smoked. She quit smokeless tobacco use about 4 years ago.  Her smokeless tobacco use included chew. She reports that she does not drink alcohol  and does not use drugs.  Allergies  Allergen Reactions   Aspirin Other (See Comments)    Triggers asthma    Family History  Problem Relation Age of Onset   Kidney disease Mother    Hypertension Mother    Other Father        gangrene with amputation    Diabetes Sister    Cancer Brother    Heart disease Brother    Schizophrenia Daughter    Diabetes Daughter    Stroke Daughter    Diabetes Sister    Breast cancer Neg Hx     Prior to Admission medications   Medication Sig Start Date End Date Taking? Authorizing Provider  acetaminophen (TYLENOL) 500 MG tablet Take 500 mg by mouth every 6 (six) hours as needed for moderate pain.   Yes [provider]  albuterol (VENTOLIN HFA) 108 (90 Base) MCG/ACT inhaler Inhale 2 puffs into the lungs every 4 (four) hours as needed for wheezing or shortness of breath. 04/29/20  Yes Medina-Vargas, Monina C, NP  alendronate (FOSAMAX) 70 MG tablet Take 70 mg by mouth once a week. Sundays 05/30/21  Yes [provider]  busPIRone (BUSPAR) 5 MG tablet Take 1 tablet (5 mg total) by mouth 3 (three) times daily. 04/29/20  Yes Medina-Vargas, Monina C, NP  donepezil (ARICEPT) 10 MG tablet Take 1 tablet (10 mg total) by mouth at bedtime. 09/05/20  Yes Nita Sells, MD  loratadine (CLARITIN) 10 MG tablet Take 10 mg by mouth daily as needed for allergies.   Yes [provider]  memantine (NAMENDA XR) 28 MG CP24 24 hr capsule Take 1 capsule (28 mg total) by mouth every morning. 09/05/20  Yes Nita Sells, MD  ondansetron (ZOFRAN ODT) 4 MG disintegrating tablet 4mg  ODT q4 hours prn nausea/vomit Patient taking differently: Take 4 mg by mouth every 8 (eight) hours as needed for nausea or vomiting. 12/10/20  Yes Mesner, Corene Cornea, MD  pravastatin (PRAVACHOL) 10 MG tablet Take 10 mg by mouth at bedtime. 08/04/21  Yes [provider]  escitalopram (LEXAPRO) 10 MG tablet Take 1 tablet (10 mg total) by mouth every evening. Patient not taking: Reported on 08/27/2021 09/05/20   Nita Sells, MD  feeding supplement (ENSURE ENLIVE / ENSURE PLUS) LIQD Take 237 mLs by mouth 2 (two) times daily between meals. Patient not taking: Reported on 08/27/2021 04/11/20   Flora Lipps, MD  sodium  zirconium cyclosilicate (LOKELMA) 10 g PACK packet Take 10 g by mouth 2 (two) times daily. Patient not taking: Reported on 08/27/2021 09/05/20   Nita Sells, MD    Physical Exam:  Vitals:   08/27/21 2230 08/28/21 0100 08/28/21 0340 08/28/21 0500  BP: (!) 129/58 123/72 (!) 145/71 134/76  Pulse: 60 (!) 118 (!) 53 68  Resp: 18 16 15 19   Temp:      TempSrc:      SpO2: 100% 100% 100% 98%    Constitutional: Awake alert and oriented x 1,  no associated distress.   Skin: no rashes, no lesions, poor skin turgor noted. Eyes: Pupils are equally reactive to light.  No evidence of scleral icterus or conjunctival pallor.  ENMT: Moist mucous membranes noted.  Posterior pharynx clear of any exudate or lesions.   Neck: normal, supple, no masses, no thyromegaly.  No evidence of jugular venous distension.   Respiratory: clear to auscultation bilaterally, no wheezing, no crackles. Normal respiratory effort. No accessory muscle use.  Cardiovascular: Regular rate and rhythm, no murmurs / rubs /  gallops. No extremity edema. 2+ pedal pulses. No carotid bruits.  Chest:   Nontender without crepitus or deformity.   Back:   Nontender without crepitus or deformity. Abdomen: Abdomen is soft and nontender.  No evidence of intra-abdominal masses.  Positive bowel sounds noted in all quadrants.   Musculoskeletal: No joint deformity upper and lower extremities. Good ROM, no contractures. Normal muscle tone.  Neurologic: CN 2-12 grossly intact. Sensation intact.  Patient moving all 4 extremities spontaneously.  Patient is following all commands.  Patient is responsive to verbal stimuli.   Psychiatric: Normal mood, appropriate affect.  Patient does not seem to possess insight as to her current situation.  Data Reviewed:  I have personally reviewed and interpreted labs, imaging.  Significant findings are:  Chemistry reveals BUN 27, creatinine 1.2, calcium 6.7 CBC revealing white blood cell count 7.0,  hemoglobin 9.8, hematocrit 32.5, platelet count 146. First troponin 3, second troponin 5. Chest x-ray personally reviewed No evidence of acute cardiopulmonary disease.  EKG: Personally reviewed.  Rhythm is atrial paced rhythm with heart rate of 60 bpm.  Left anterior fascicular block.  No dynamic ST segment changes appreciated.    Assessment and Plan: * Episode of altered consciousness Patient has been exhibiting episodes of decreased responsiveness for the past several weeks of unknown etiology Unclear as to whether these episodes syncope or type of seizure Case briefly discussed with Dr. Cheral Marker with neurology who recommends transfer to [facility] for consideration of continuous EEG monitoring.  Neurology will need to be formally consulted upon arrival to [facility].  With patient's history of symptomatic bradycardia, patient's pacemaker has been interrogated multiple times and has been proven to be functioning normally. Echocardiogram ordered for the morning Patient has not exhibited any evidence of hypotension or evidence of underlying infection Monitoring patient on telemetry Troponins unremarkable thus far Reviewing home medications Monitoring electrolytes Gentle intravenous hydration  Chronic diastolic CHF (congestive heart failure) (HCC) No clinical evidence of cardiogenic volume overload Strict input and output monitoring Daily weights Low-sodium diet   Chronic kidney disease, stage 3b (HCC) Strict intake and output monitoring Creatinine near baseline Minimizing nephrotoxic agents as much as possible Serial chemistries to monitor renal function and electrolytes   Asthma with chronic obstructive pulmonary disease (COPD) ([place]) No evidence of COPD/asthma exacerbation this time As needed bronchodilator therapy for episodic shortness of breath and wheezing.   Essential hypertension While patient has a documented history of hypertension, patient is currently not  on antihypertensives per review of home medication regimen As needed intravenous antihypertensives for markedly elevated blood pressure.  Dementia without behavioral disturbance (HCC) Longstanding known history of dementia and cognitive deficit Minimizing uncomfortable stimuli Minimizing mood altering and sedating agents Frequent redirection by staff Fall precautions        Code Status:  DNR  code status based on review of DNR signed 08/31/2020 this will need to be confirmed with family upon first availability.  Family Communication: I have been unsuccessful and attempted to contact family via the listed phone number.  Consults: Discussed with Dr. Cheral Marker via secure chat - patient to continue CMP monitoring and formal consultation upon arrival to [facility].  Severity of Illness:  The appropriate patient status for this patient is OBSERVATION. Observation status is judged to be reasonable and necessary in order to provide the required intensity of service to ensure the patient's safety. The patient's presenting symptoms, physical exam findings, and initial radiographic and laboratory data in the context of their medical condition  is felt to place them at decreased risk for further clinical deterioration. Furthermore, it is anticipated that the patient will be medically stable for discharge from the hospital within 2 midnights of admission.   Author:  Vernelle Emerald MD  08/28/2021 5:44 AM

## 2021-08-28 NOTE — ED Notes (Signed)
Family updated as to patient's status.

## 2021-08-28 NOTE — Assessment & Plan Note (Signed)
   No evidence of COPD/asthma exacerbation this time  As needed bronchodilator therapy for episodic shortness of breath and wheezing.

## 2021-08-28 NOTE — Progress Notes (Signed)
Remote pacemaker transmission.   

## 2021-08-28 NOTE — Progress Notes (Signed)
Patient brought to 4E from [facility] ED. VSS. Patient oriented to room and staff. Call bell in reach.  Daymon Larsen, RN

## 2021-08-29 DIAGNOSIS — Z933 Colostomy status: Secondary | ICD-10-CM | POA: Diagnosis not present

## 2021-08-29 DIAGNOSIS — N1832 Chronic kidney disease, stage 3b: Secondary | ICD-10-CM | POA: Diagnosis present

## 2021-08-29 DIAGNOSIS — J449 Chronic obstructive pulmonary disease, unspecified: Secondary | ICD-10-CM | POA: Diagnosis present

## 2021-08-29 DIAGNOSIS — R569 Unspecified convulsions: Secondary | ICD-10-CM | POA: Diagnosis present

## 2021-08-29 DIAGNOSIS — Z981 Arthrodesis status: Secondary | ICD-10-CM | POA: Diagnosis not present

## 2021-08-29 DIAGNOSIS — Z950 Presence of cardiac pacemaker: Secondary | ICD-10-CM | POA: Diagnosis not present

## 2021-08-29 DIAGNOSIS — Z8249 Family history of ischemic heart disease and other diseases of the circulatory system: Secondary | ICD-10-CM | POA: Diagnosis not present

## 2021-08-29 DIAGNOSIS — Z7983 Long term (current) use of bisphosphonates: Secondary | ICD-10-CM | POA: Diagnosis not present

## 2021-08-29 DIAGNOSIS — R55 Syncope and collapse: Secondary | ICD-10-CM | POA: Diagnosis present

## 2021-08-29 DIAGNOSIS — Z886 Allergy status to analgesic agent status: Secondary | ICD-10-CM | POA: Diagnosis not present

## 2021-08-29 DIAGNOSIS — Z841 Family history of disorders of kidney and ureter: Secondary | ICD-10-CM | POA: Diagnosis not present

## 2021-08-29 DIAGNOSIS — Z743 Need for continuous supervision: Secondary | ICD-10-CM | POA: Diagnosis not present

## 2021-08-29 DIAGNOSIS — Z79899 Other long term (current) drug therapy: Secondary | ICD-10-CM | POA: Diagnosis not present

## 2021-08-29 DIAGNOSIS — R404 Transient alteration of awareness: Secondary | ICD-10-CM | POA: Diagnosis not present

## 2021-08-29 DIAGNOSIS — Z66 Do not resuscitate: Secondary | ICD-10-CM | POA: Diagnosis present

## 2021-08-29 DIAGNOSIS — F0390 Unspecified dementia without behavioral disturbance: Secondary | ICD-10-CM | POA: Diagnosis present

## 2021-08-29 DIAGNOSIS — I130 Hypertensive heart and chronic kidney disease with heart failure and stage 1 through stage 4 chronic kidney disease, or unspecified chronic kidney disease: Secondary | ICD-10-CM | POA: Diagnosis present

## 2021-08-29 DIAGNOSIS — E1122 Type 2 diabetes mellitus with diabetic chronic kidney disease: Secondary | ICD-10-CM | POA: Diagnosis present

## 2021-08-29 DIAGNOSIS — E1140 Type 2 diabetes mellitus with diabetic neuropathy, unspecified: Secondary | ICD-10-CM | POA: Diagnosis present

## 2021-08-29 DIAGNOSIS — I5032 Chronic diastolic (congestive) heart failure: Secondary | ICD-10-CM | POA: Diagnosis present

## 2021-08-29 DIAGNOSIS — G9341 Metabolic encephalopathy: Secondary | ICD-10-CM | POA: Diagnosis present

## 2021-08-29 DIAGNOSIS — Z87891 Personal history of nicotine dependence: Secondary | ICD-10-CM | POA: Diagnosis not present

## 2021-08-29 DIAGNOSIS — Z833 Family history of diabetes mellitus: Secondary | ICD-10-CM | POA: Diagnosis not present

## 2021-08-29 NOTE — Progress Notes (Signed)
LTM EEG hooked up and running - no initial skin breakdown - push button tested - neuro notified. [facility] monitoring.  

## 2021-08-29 NOTE — Procedures (Signed)
Patient Name: [name]  MRN: [id number]  Epilepsy Attending: Lora Havens  Referring Physician/Provider: Kerney Elbe, MD  Duration: 08/29/2021 1191 to 0840   Patient history: 88yo F with episodes of decreased responsiveness for the past several weeks of unknown etiology. EEG to evaluate for seizure.   Level of alertness: Awake, asleep   AEDs during EEG study: None   Technical aspects: This EEG study was done with scalp electrodes positioned according to the 10-20 International system of electrode placement. Electrical activity was acquired at a sampling rate of 500Hz  and reviewed with a high frequency filter of 70Hz  and a low frequency filter of 1Hz . EEG data were recorded continuously and digitally stored.    Description: The posterior dominant rhythm consists of 9-10 Hz activity of moderate voltage (25-35 uV) seen predominantly in posterior head regions, symmetric and reactive to eye opening and eye closing. Sleep was characterized by vertex waves, sleep spindles (12 to 14 Hz), maximal frontocentral region. Hyperventilation and photic stimulation were not performed.      IMPRESSION: This study is within normal limits. No seizures or epileptiform discharges were seen throughout the recording.   Constancia Geeting [name]

## 2021-08-29 NOTE — Progress Notes (Signed)
PROGRESS NOTE    [name]  LTJ:[id number] DOB: 1933-04-24 DOA: 08/27/2021 PCP: Leeroy Cha, MD   Brief Narrative:  88 year old female with past medical history of COPD with asthma, symptomatic bradycardia status post [place] pacemaker 04/2018 (by Dr. 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 PPM has been interrogated multiple times without any overt findings. Neurology requested to consult in the setting of syncope versus presyncope without clear vascular issue, concern there is a neurological component.   Assessment & Plan:   Principal Problem:   Episode of altered consciousness Active Problems:   Chronic diastolic CHF (congestive heart failure) (HCC)   Chronic kidney disease, stage 3b (HCC)   Asthma with chronic obstructive pulmonary disease (COPD) (HCC)   Essential hypertension   Dementia without behavioral disturbance (HCC)  Transient episode of altered consciousness/metabolic encephalopathy  Cannot rule out seizure -Family noting transient episodes of altered mental status and decreased responsiveness at varying times of day over the past few weeks -Initially thought to be bradycardic response but pacemaker has been evaluated without overt findings -Neurology consulted to evaluate for possible seizure -EEG thus far remains negative, will continue EEG overnight per neurology recommendations in hopes to catch 1 of these syncopal events to ensure this is not an epileptiform activity -Echo shows grade 1 diastolic dysfunction but otherwise unremarkable -Orthostatics initially positive from supine to seated but ultimately negative upon standing, will repeat these for more definitive diagnosis -    Chronic diastolic CHF (congestive heart failure) (HCC) -Not in acute exacerbation, echo shows grade 1 diastolic dysfunction stable from previous -Follow I's and O's, daily weights, low-sodium diet   Chronic kidney disease, stage 3b (HCC) -At baseline continue to advance diet as tolerated  Asthma with chronic obstructive pulmonary disease (COPD) ([place]) -Without evidence of exacerbation  -Continue home nebs, inhalers as appropriate   Essential hypertension -No home medications documented, well-controlled currently  Dementia without behavioral disturbance (HCC) - Longstanding known history of dementia and cognitive deficit -High risk for hospital delirium, family at bedside if possible  DVT prophylaxis: Lovenox Code Status: DNR Family Communication: Daughter at bedside  Status is: Inpatient  Dispo: The patient is from: Home              Anticipated d/c is to: Home              Anticipated d/c date is: 24 to 48 hours pending clinical course              Patient currently not medically stable for discharge given ongoing need for close monitoring in setting of orthostatic hypotension, need for overnight EEG per neurology  Consultants:  Neurology  Procedures:  None  Antimicrobials:  None  Subjective: No acute issues or events overnight denies nausea vomiting diarrhea constipation headache fevers chills or chest pain  Objective: Vitals:   08/29/21 0018 08/29/21 0019 08/29/21 0425 08/29/21 0743  BP: (!) 160/69  124/79 (!) 160/84  Pulse: 65  63 60  Resp: 14  18 14   Temp: 98 F (36.7 C)  97.7 F (36.5 C) 98 F (36.7 C)  TempSrc: Oral  Oral Oral  SpO2:   100% 100%  Height:  5\' 4"  (1.626 m)      Intake/Output Summary (Last 24 hours) at 08/29/2021 0746  Last data filed at 08/29/2021 0744 Gross per 24 hour  Intake 250 ml  Output 800 ml  Net -550 ml   There were no vitals filed for this visit.  Examination:  General:  Pleasantly resting in bed, No acute  distress. Neck:  Without mass or deformity.  Trachea is midline. Lungs:  Clear to auscultate bilaterally without rhonchi, wheeze, or rales. Heart:  Regular rate and rhythm.  Without murmurs, rubs, or gallops. Abdomen:  Soft, nontender, nondistended.  Without guarding or rebound. Extremities: Without cyanosis, clubbing, edema, or obvious deformity.  Data Reviewed: I have personally reviewed following labs and imaging studies  CBC: Recent Labs  Lab 08/25/21 1510 08/27/21 1800 09/21/2021 0410  WBC 6.5 7.0 7.0  NEUTROABS 4.7 5.7 4.3  HGB 10.6* 9.8* 10.9*  HCT 36.5 32.5* 34.3*  MCV 103.1* 98.2 95.8  PLT 196 146* 299   Basic Metabolic Panel: Recent Labs  Lab 08/25/21 1510 08/27/21 1800 2021-09-21 0410  NA 142 141 142  K 4.5 4.0 4.9  CL 112* 117* 109  CO2 21* 21* 28  GLUCOSE 132* 153* 98  BUN 32* 27* 36*  CREATININE 2.07* 1.20* 1.79*  CALCIUM 9.0 6.7* 9.3  MG  --   --  1.9   GFR: CrCl cannot be calculated (Unknown ideal weight.). Liver Function Tests: Recent Labs  Lab 08/25/21 1510 21-Sep-2021 0410  AST 42* 16  ALT 18 13  ALKPHOS 65 59  BILITOT 0.4 0.5  PROT 6.5 6.8  ALBUMIN 3.3* 3.4*   No results for input(s): "LIPASE", "AMYLASE" in the last 168 hours. No results for input(s): "AMMONIA" in the last 168 hours. Coagulation Profile: No results for input(s): "INR", "PROTIME" in the last 168 hours. Cardiac Enzymes: No results for input(s): "CKTOTAL", "CKMB", "CKMBINDEX", "TROPONINI" in the last 168 hours. BNP (last 3 results) No results for input(s): "PROBNP" in the last 8760 hours. HbA1C: No results for input(s): "HGBA1C" in the last 72 hours. CBG: Recent Labs  Lab 08/25/21 1511 08/27/21 1909  GLUCAP 151* 175*   Lipid Profile: No results for input(s): "CHOL", "HDL", "LDLCALC", "TRIG", "CHOLHDL", "LDLDIRECT" in the last 72 hours. Thyroid Function Tests: No results for input(s): "TSH", "T4TOTAL", "FREET4", "T3FREE", "THYROIDAB" in the last 72 hours. Anemia  Panel: No results for input(s): "VITAMINB12", "FOLATE", "FERRITIN", "TIBC", "IRON", "RETICCTPCT" in the last 72 hours. Sepsis Labs: No results for input(s): "PROCALCITON", "LATICACIDVEN" in the last 168 hours.  Recent Results (from the past 240 hour(s))  Urine Culture     Status: Abnormal   Collection Time: 08/25/21 10:26 PM   Specimen: Urine, Clean Catch  Result Value Ref Range Status   Specimen Description URINE, CLEAN CATCH  Final   Special Requests   Final    NONE Performed at [facility], [address], [place], [place] [address]    Culture MULTIPLE SPECIES PRESENT, SUGGEST RECOLLECTION (A)  Final   Report Status 08/27/2021 FINAL  Final         Radiology Studies: EEG adult  Result Date: September 21, 2021 Lora Havens, MD     2021-09-21  6:25 PM Patient Name: PORSHA SKILTON MRN: [id number] Epilepsy Attending: Lora Havens Referring Physician/Provider: Vernelle Emerald, MD Date: 09-21-2021 Duration: 24.50 mins Patient history: 88yo F with episodes of decreased responsiveness for the past several weeks of unknown etiology. EEG to evaluate for seizure. Level of alertness: Awake, asleep AEDs during EEG study: None Technical aspects: This EEG study was done with scalp electrodes positioned according to  the 10-20 International system of electrode placement. Electrical activity was acquired at a sampling rate of 500Hz  and reviewed with a high frequency filter of 70Hz  and a low frequency filter of 1Hz . EEG data were recorded continuously and digitally stored. Description: The posterior dominant rhythm consists of 9-10 Hz activity of moderate voltage (25-35 uV) seen predominantly in posterior head regions, symmetric and reactive to eye opening and eye closing. Sleep was characterized by vertex waves, sleep spindles (12 to 14 Hz), maximal frontocentral region. Hyperventilation and photic stimulation were not performed.   IMPRESSION: This study is within normal limits. No seizures or  epileptiform discharges were seen throughout the recording. Lora Havens   ECHOCARDIOGRAM COMPLETE  Result Date: 08/28/2021    ECHOCARDIOGRAM REPORT   Patient Name:   [name] Date of Exam: 08/28/2021 Medical Rec #:  [id number]      Height:       64.0 in Accession #:    [id number]     Weight:       120.1 lb Date of Birth:  [birth year]      BSA:          1.575 m Patient Age:    52 years       BP:           145/70 mmHg Patient Gender: F              HR:           68 bpm. Exam Location:  Inpatient Procedure: 2D Echo, Cardiac Doppler and Color Doppler Indications:    Syncope  History:        Patient has prior history of Echocardiogram examinations, most                 recent 12/17/2017. CHF; Risk Factors:Hypertension and Diabetes.  Sonographer:    Jefferey Pica Referring Phys: [id number] [place]  1. Left ventricular ejection fraction, by estimation, is 60 to 65%. The left ventricle has normal function. The left ventricle has no regional wall motion abnormalities. There is mild concentric left ventricular hypertrophy. Left ventricular diastolic parameters are consistent with Grade I diastolic dysfunction (impaired relaxation). Elevated left atrial pressure.  2. Right ventricular systolic function is mildly reduced. The right ventricular size is normal. Mildly increased right ventricular wall thickness. There is normal pulmonary artery systolic pressure.  3. The mitral valve is normal in structure. Mild mitral valve regurgitation. No evidence of mitral stenosis.  4. The aortic valve is tricuspid. Aortic valve regurgitation is trivial. No aortic stenosis is present.  5. There is moderate dilatation of the ascending aorta, measuring 41 mm.  6. The inferior vena cava is normal in size with greater than 50% respiratory variability, suggesting right atrial pressure of 3 mmHg. FINDINGS  Left Ventricle: Left ventricular ejection fraction, by estimation, is 60 to 65%. The left ventricle has normal  function. The left ventricle has no regional wall motion abnormalities. The left ventricular internal cavity size was normal in size. There is  mild concentric left ventricular hypertrophy. Left ventricular diastolic parameters are consistent with Grade I diastolic dysfunction (impaired relaxation). Elevated left atrial pressure. Right Ventricle: The right ventricular size is normal. Mildly increased right ventricular wall thickness. Right ventricular systolic function is mildly reduced. There is normal pulmonary artery systolic pressure. The tricuspid regurgitant velocity is 2.36 m/s, and with an assumed right atrial pressure of 5 mmHg, the estimated right ventricular systolic pressure is 30.8 mmHg. Left Atrium: Left  atrial size was normal in size. Right Atrium: Right atrial size was normal in size. Pericardium: There is no evidence of pericardial effusion. Presence of epicardial fat layer. Mitral Valve: The mitral valve is normal in structure. Mild mitral valve regurgitation. No evidence of mitral valve stenosis. Tricuspid Valve: The tricuspid valve is normal in structure. Tricuspid valve regurgitation is mild . No evidence of tricuspid stenosis. Aortic Valve: The aortic valve is tricuspid. Aortic valve regurgitation is trivial. Aortic regurgitation PHT measures 905 msec. No aortic stenosis is present. Aortic valve peak gradient measures 6.8 mmHg. Pulmonic Valve: The pulmonic valve was normal in structure. Pulmonic valve regurgitation is not visualized. No evidence of pulmonic stenosis. Aorta: The aortic root is normal in size and structure. There is moderate dilatation of the ascending aorta, measuring 41 mm. Venous: The inferior vena cava is normal in size with greater than 50% respiratory variability, suggesting right atrial pressure of 3 mmHg. IAS/Shunts: No atrial level shunt detected by color flow Doppler. Additional Comments: A device lead is visualized.  LEFT VENTRICLE PLAX 2D LVIDd:         4.00 cm    Diastology LVIDs:         2.30 cm   LV e' medial:    3.43 cm/s LV PW:         1.30 cm   LV E/e' medial:  20.7 LV IVS:        1.10 cm   LV e' lateral:   5.78 cm/s LVOT diam:     1.80 cm   LV E/e' lateral: 12.3 LV SV:         55 LV SV Index:   35 LVOT Area:     2.54 cm  RIGHT VENTRICLE            IVC RV S prime:     7.88 cm/s  IVC diam: 1.30 cm TAPSE (M-mode): 1.6 cm LEFT ATRIUM             Index        RIGHT ATRIUM           Index LA diam:        2.90 cm 1.84 cm/m   RA Area:     14.10 cm LA Vol (A2C):   28.8 ml 18.28 ml/m  RA Volume:   34.30 ml  21.78 ml/m LA Vol (A4C):   29.2 ml 18.54 ml/m LA Biplane Vol: 30.2 ml 19.17 ml/m  AORTIC VALVE                 PULMONIC VALVE AV Area (Vmax): 1.74 cm     PV Vmax:       0.66 m/s AV Vmax:        130.00 cm/s  PV Peak grad:  1.8 mmHg AV Peak Grad:   6.8 mmHg LVOT Vmax:      88.80 cm/s LVOT Vmean:     63.300 cm/s LVOT VTI:       0.217 m AI PHT:         905 msec  AORTA Ao Root diam: 2.80 cm Ao Asc diam:  4.05 cm MITRAL VALVE                TRICUSPID VALVE MV Area (PHT): 2.34 cm     TR Peak grad:   22.3 mmHg MV Decel Time: 324 msec     TR Vmax:        236.00 cm/s MV E velocity: 71.10 cm/s MV  A velocity: 101.00 cm/s  SHUNTS MV E/A ratio:  0.70         Systemic VTI:  0.22 m                             Systemic Diam: 1.80 cm Godfrey Pick Tobb DO Electronically signed by Berniece Salines DO Signature Date/Time: 08/28/2021/10:48:37 AM    Final    DG Chest 2 View  Result Date: 08/27/2021 CLINICAL DATA:  Syncopal episode with hypotension. EXAM: CHEST - 2 VIEW COMPARISON:  PA Lat 08/25/2021 FINDINGS: There is mild cardiomegaly without evidence of CHF. There is aortic tortuosity and atherosclerosis with stable mediastinal configuration. The lungs are slightly emphysematous but clear. There are multiple overlying monitor wires. Left chest dual lead pacing system and wire insertions are unaltered. Osteopenia and thoracic spondylosis. Bilateral shoulder DJD. IMPRESSION: No evidence of acute  chest disease. Stable COPD chest with cardiomegaly. Aortic atherosclerosis. Electronically Signed   By: Telford Nab M.D.   On: 08/27/2021 21:58    Scheduled Meds:  busPIRone  5 mg Oral TID   donepezil  10 mg Oral QHS   enoxaparin (LOVENOX) injection  30 mg Subcutaneous Q24H   memantine  28 mg Oral q morning   pravastatin  10 mg Oral QHS   Continuous Infusions:   LOS: 0 days   Time spent: 80min  Lothar Prehn C Geralda Baumgardner, DO [facility]  If 7PM-7AM, please contact night-coverage [email or web address]  08/29/2021, 7:46 AM

## 2021-08-29 NOTE — Consult Note (Signed)
NEURO HOSPITALIST CONSULT NOTE   Requesting physician: Dr. Cyd Silence  Reason for Consult: Multiple spells of passing out  History obtained from:  Chart     HPI:                                                                                                                                          [name] is an 88 y.o. female with a PMHx of asthma, CKD3, dementia, DM with neuropathy, HTN and symptomatic bradycardia s/p pacemaker placement who presented to the [facility] ED on Wednesday afternoon after a syncopal episode while sitting at the dinner table at her home that was witnessed by her granddaughter. A second syncopal spell was witnessed by EMS. Initial BP was 89/47, which improved to 129/63 after 150 mL NS bolus by EMS. EMS reported pt's pacemaker was pacing at a rate of 60. On arrival to the ED, the patient was asymptomatic.   Additional history obtained from EDP note: "Eating early dinner around 4 PM.  Patient felt suddenly lightheaded and notified her went unconscious briefly.  No trauma.  Similar episode occurred 2 days ago and was seen in the ER.  Also had similar episode on 5/29. Reviewed last cardiology note, patient has history of symptomatic bradycardia and has undergone [place] pacemaker placement in 2020."  EKG in the ED was unchanged from prior.  Pacemaker was interrogated and noted to be functioning appropriately, with no events noted.    In addition to her episodes of syncope, she has been having spells of near-syncope.   Hospitalist requested transfer to [facility] for LTM EEG to assess possibility of unusual manifestation of seizures to explain her spells. Per Hospitalist note: "Patient was admitted due to unexplained recurrent bouts of decreased responsiveness / loss of consciousness.  Unclear whether these episodes are syncope or type of seizure."  Past Medical History:  Diagnosis Date   Asthma    CKD (chronic kidney disease) stage 3, GFR 30-59 ml/min  ([place]) 05/01/2018   Colostomy present ([place])    hx/notes 07/17/2010   Dementia ([place])    Diabetic neuropathy ([place])    Hypertension    hx/notes 07/17/2010   Pneumonia    recent/notes 10/08/2016   Symptomatic bradycardia 12/15/2017   Type II diabetes mellitus ([place])    hx/notes 07/17/2010    Past Surgical History:  Procedure Laterality Date   ABDOMINAL HYSTERECTOMY     COLOSTOMY     S/P SBO hx/notes 07/17/2010   LUMBAR FUSION  11/2006   hx/notes 07/17/2010   NASAL SINUS SURGERY  04/28/2002   Bilateral inferior turbinate reductions, bilateral maxillary antrotomies, bilateral total ethmoidectomies, bilateral frontal recess explorations, bilateral sphenoidotomies, [employer] guidance./notes 07/28/2010   PACEMAKER IMPLANT N/A 05/02/2018   Procedure: PACEMAKER IMPLANT;  Surgeon: Cristopher Peru  W, MD;  Location: [facility];  Service: Cardiovascular;  Laterality: N/A;    Family History  Problem Relation Age of Onset   Kidney disease Mother    Hypertension Mother    Other Father        gangrene with amputation   Diabetes Sister    Cancer Brother    Heart disease Brother    Schizophrenia Daughter    Diabetes Daughter    Stroke Daughter    Diabetes Sister    Breast cancer Neg Hx             Social History:  reports that she has never smoked. She quit smokeless tobacco use about 4 years ago.  Her smokeless tobacco use included chew. She reports that she does not drink alcohol and does not use drugs.  Allergies  Allergen Reactions   Aspirin Other (See Comments)    Triggers asthma    MEDICATIONS:                                                                                                                     Prior to Admission:  Medications Prior to Admission  Medication Sig Dispense Refill Last Dose   acetaminophen (TYLENOL) 500 MG tablet Take 500 mg by mouth every 6 (six) hours as needed for moderate pain.   Past Week   albuterol (VENTOLIN HFA) 108 (90 Base) MCG/ACT inhaler Inhale 2  puffs into the lungs every 4 (four) hours as needed for wheezing or shortness of breath. 18 g 0 Past Month   alendronate (FOSAMAX) 70 MG tablet Take 70 mg by mouth once a week. Sundays   08/24/2021   busPIRone (BUSPAR) 5 MG tablet Take 1 tablet (5 mg total) by mouth 3 (three) times daily. 90 tablet 0 08/27/2021   donepezil (ARICEPT) 10 MG tablet Take 1 tablet (10 mg total) by mouth at bedtime. 5 tablet 0 08/26/2021   loratadine (CLARITIN) 10 MG tablet Take 10 mg by mouth daily as needed for allergies.   Past Month   memantine (NAMENDA XR) 28 MG CP24 24 hr capsule Take 1 capsule (28 mg total) by mouth every morning. 5 capsule 0 08/27/2021   ondansetron (ZOFRAN ODT) 4 MG disintegrating tablet 4mg  ODT q4 hours prn nausea/vomit (Patient taking differently: Take 4 mg by mouth every 8 (eight) hours as needed for nausea or vomiting.) 30 tablet 0 Past Month   pravastatin (PRAVACHOL) 10 MG tablet Take 10 mg by mouth at bedtime.   08/26/2021   escitalopram (LEXAPRO) 10 MG tablet Take 1 tablet (10 mg total) by mouth every evening. (Patient not taking: Reported on 08/27/2021) 4 tablet 0 Not Taking   feeding supplement (ENSURE ENLIVE / ENSURE PLUS) LIQD Take 237 mLs by mouth 2 (two) times daily between meals. (Patient not taking: Reported on 08/27/2021)   Not Taking   sodium zirconium cyclosilicate (LOKELMA) 10 g PACK packet Take 10 g by mouth 2 (two) times daily. (Patient not taking: Reported on 08/27/2021)  Not Taking   Scheduled:  busPIRone  5 mg Oral TID   donepezil  10 mg Oral QHS   enoxaparin (LOVENOX) injection  30 mg Subcutaneous Q24H   memantine  28 mg Oral q morning   pravastatin  10 mg Oral QHS     ROS:                                                                                                                                       The patient has no complaints in the context of poor memory.    Blood pressure (!) 160/69, pulse (!) 30, temperature 98 F (36.7 C), temperature source Oral, resp.  rate 14, SpO2 100 %.   General Examination:                                                                                                       Physical Exam  HEENT-  [place]/AT   Lungs- Respirations unlabored Extremities- No edema  Neurological Examination Mental Status: Alert. Not oriented to the day, month or year. Oriented to the state and knows that she is in the hospital. Speech fluent with intact naming and comprehension. No dysarthria. Paucity of ideas as expressed by her speech. Insight is fair. Mood euthymic. Pleasant and cooperative.  Cranial Nerves: II: Temporal visual fields intact with no extinction to DSS. PERRL  III,IV, VI: No ptosis. EOMI with saccadic quality of visual pursuits.  V: Temp sensation equal bilaterally  VII: Smile symmetric VIII: Hearing intact to voice IX,X: No hoarseness XI: Symmetric shoulder shrug XII: Midline tongue extension Motor: BUE 5/5 proximally and distally BLE 5/5 proximally and distally  No pronator drift.  Sensory: Temp and light touch intact throughout, bilaterally. No extinction to DSS.  Deep Tendon Reflexes: 2+ and symmetric throughout Cerebellar: No ataxia with FNF bilaterally  Gait: Deferred   Lab Results: Basic Metabolic Panel: Recent Labs  Lab 08/25/21 1510 08/27/21 1800 08/28/21 0410  NA 142 141 142  K 4.5 4.0 4.9  CL 112* 117* 109  CO2 21* 21* 28  GLUCOSE 132* 153* 98  BUN 32* 27* 36*  CREATININE 2.07* 1.20* 1.79*  CALCIUM 9.0 6.7* 9.3  MG  --   --  1.9    CBC: Recent Labs  Lab 08/25/21 1510 08/27/21 1800 08/28/21 0410  WBC 6.5 7.0 7.0  NEUTROABS 4.7 5.7 4.3  HGB 10.6* 9.8* 10.9*  HCT 36.5 32.5* 34.3*  MCV 103.1* 98.2 95.8  PLT 196 146* 189  Cardiac Enzymes: No results for input(s): "CKTOTAL", "CKMB", "CKMBINDEX", "TROPONINI" in the last 168 hours.  Lipid Panel: No results for input(s): "CHOL", "TRIG", "HDL", "CHOLHDL", "VLDL", "LDLCALC" in the last 168 hours.  Imaging: EEG adult  Result Date:  08/28/2021 Lora Havens, MD     08/28/2021  6:25 PM Patient Name: [name] MRN: [id number] Epilepsy Attending: Lora Havens Referring Physician/Provider: Vernelle Emerald, MD Date: 08/28/2021 Duration: 24.50 mins Patient history: 88yo F with episodes of decreased responsiveness for the past several weeks of unknown etiology. EEG to evaluate for seizure. Level of alertness: Awake, asleep AEDs during EEG study: None Technical aspects: This EEG study was done with scalp electrodes positioned according to the 10-20 International system of electrode placement. Electrical activity was acquired at a sampling rate of 500Hz  and reviewed with a high frequency filter of 70Hz  and a low frequency filter of 1Hz . EEG data were recorded continuously and digitally stored. Description: The posterior dominant rhythm consists of 9-10 Hz activity of moderate voltage (25-35 uV) seen predominantly in posterior head regions, symmetric and reactive to eye opening and eye closing. Sleep was characterized by vertex waves, sleep spindles (12 to 14 Hz), maximal frontocentral region. Hyperventilation and photic stimulation were not performed.   IMPRESSION: This study is within normal limits. No seizures or epileptiform discharges were seen throughout the recording. Lora Havens   ECHOCARDIOGRAM COMPLETE  Result Date: 08/28/2021    ECHOCARDIOGRAM REPORT   Patient Name:   [name] Date of Exam: 08/28/2021 Medical Rec #:  [id number]      Height:       64.0 in Accession #:    [id number]     Weight:       120.1 lb Date of Birth:  14-Apr-1933      BSA:          1.575 m Patient Age:    39 years       BP:           145/70 mmHg Patient Gender: F              HR:           68 bpm. Exam Location:  Inpatient Procedure: 2D Echo, Cardiac Doppler and Color Doppler Indications:    Syncope  History:        Patient has prior history of Echocardiogram examinations, most                 recent 12/17/2017. CHF; Risk Factors:Hypertension and  Diabetes.  Sonographer:    Jefferey Pica Referring Phys: [id number] [place]  1. Left ventricular ejection fraction, by estimation, is 60 to 65%. The left ventricle has normal function. The left ventricle has no regional wall motion abnormalities. There is mild concentric left ventricular hypertrophy. Left ventricular diastolic parameters are consistent with Grade I diastolic dysfunction (impaired relaxation). Elevated left atrial pressure.  2. Right ventricular systolic function is mildly reduced. The right ventricular size is normal. Mildly increased right ventricular wall thickness. There is normal pulmonary artery systolic pressure.  3. The mitral valve is normal in structure. Mild mitral valve regurgitation. No evidence of mitral stenosis.  4. The aortic valve is tricuspid. Aortic valve regurgitation is trivial. No aortic stenosis is present.  5. There is moderate dilatation of the ascending aorta, measuring 41 mm.  6. The inferior vena cava is normal in size with greater than 50% respiratory variability, suggesting right atrial pressure of 3 mmHg.  FINDINGS  Left Ventricle: Left ventricular ejection fraction, by estimation, is 60 to 65%. The left ventricle has normal function. The left ventricle has no regional wall motion abnormalities. The left ventricular internal cavity size was normal in size. There is  mild concentric left ventricular hypertrophy. Left ventricular diastolic parameters are consistent with Grade I diastolic dysfunction (impaired relaxation). Elevated left atrial pressure. Right Ventricle: The right ventricular size is normal. Mildly increased right ventricular wall thickness. Right ventricular systolic function is mildly reduced. There is normal pulmonary artery systolic pressure. The tricuspid regurgitant velocity is 2.36 m/s, and with an assumed right atrial pressure of 5 mmHg, the estimated right ventricular systolic pressure is 69.6 mmHg. Left Atrium: Left atrial  size was normal in size. Right Atrium: Right atrial size was normal in size. Pericardium: There is no evidence of pericardial effusion. Presence of epicardial fat layer. Mitral Valve: The mitral valve is normal in structure. Mild mitral valve regurgitation. No evidence of mitral valve stenosis. Tricuspid Valve: The tricuspid valve is normal in structure. Tricuspid valve regurgitation is mild . No evidence of tricuspid stenosis. Aortic Valve: The aortic valve is tricuspid. Aortic valve regurgitation is trivial. Aortic regurgitation PHT measures 905 msec. No aortic stenosis is present. Aortic valve peak gradient measures 6.8 mmHg. Pulmonic Valve: The pulmonic valve was normal in structure. Pulmonic valve regurgitation is not visualized. No evidence of pulmonic stenosis. Aorta: The aortic root is normal in size and structure. There is moderate dilatation of the ascending aorta, measuring 41 mm. Venous: The inferior vena cava is normal in size with greater than 50% respiratory variability, suggesting right atrial pressure of 3 mmHg. IAS/Shunts: No atrial level shunt detected by color flow Doppler. Additional Comments: A device lead is visualized.  LEFT VENTRICLE PLAX 2D LVIDd:         4.00 cm   Diastology LVIDs:         2.30 cm   LV e' medial:    3.43 cm/s LV PW:         1.30 cm   LV E/e' medial:  20.7 LV IVS:        1.10 cm   LV e' lateral:   5.78 cm/s LVOT diam:     1.80 cm   LV E/e' lateral: 12.3 LV SV:         55 LV SV Index:   35 LVOT Area:     2.54 cm  RIGHT VENTRICLE            IVC RV S prime:     7.88 cm/s  IVC diam: 1.30 cm TAPSE (M-mode): 1.6 cm LEFT ATRIUM             Index        RIGHT ATRIUM           Index LA diam:        2.90 cm 1.84 cm/m   RA Area:     14.10 cm LA Vol (A2C):   28.8 ml 18.28 ml/m  RA Volume:   34.30 ml  21.78 ml/m LA Vol (A4C):   29.2 ml 18.54 ml/m LA Biplane Vol: 30.2 ml 19.17 ml/m  AORTIC VALVE                 PULMONIC VALVE AV Area (Vmax): 1.74 cm     PV Vmax:       0.66 m/s AV  Vmax:        130.00 cm/s  PV Peak grad:  1.8 mmHg AV Peak Grad:   6.8 mmHg LVOT Vmax:      88.80 cm/s LVOT Vmean:     63.300 cm/s LVOT VTI:       0.217 m AI PHT:         905 msec  AORTA Ao Root diam: 2.80 cm Ao Asc diam:  4.05 cm MITRAL VALVE                TRICUSPID VALVE MV Area (PHT): 2.34 cm     TR Peak grad:   22.3 mmHg MV Decel Time: 324 msec     TR Vmax:        236.00 cm/s MV E velocity: 71.10 cm/s MV A velocity: 101.00 cm/s  SHUNTS MV E/A ratio:  0.70         Systemic VTI:  0.22 m                             Systemic Diam: 1.80 cm Kardie Tobb DO Electronically signed by Berniece Salines DO Signature Date/Time: 08/28/2021/10:48:37 AM    Final    DG Chest 2 View  Result Date: 08/27/2021 CLINICAL DATA:  Syncopal episode with hypotension. EXAM: CHEST - 2 VIEW COMPARISON:  PA Lat 08/25/2021 FINDINGS: There is mild cardiomegaly without evidence of CHF. There is aortic tortuosity and atherosclerosis with stable mediastinal configuration. The lungs are slightly emphysematous but clear. There are multiple overlying monitor wires. Left chest dual lead pacing system and wire insertions are unaltered. Osteopenia and thoracic spondylosis. Bilateral shoulder DJD. IMPRESSION: No evidence of acute chest disease. Stable COPD chest with cardiomegaly. Aortic atherosclerosis. Electronically Signed   By: Telford Nab M.D.   On: 08/27/2021 21:58     Assessment: 88 year old female with multiple episodes of passing out. Cardiac work up so far has been unrevealing.  1. Exam reveals cognitive impairment. No focal weakness noted. No jerking, twitching, myoclonus, staring spells, eyelid fluttering, eyelid deviation or automatisms seen.  2. TTE with LVEF of 60 to 65%. No findings suggestive of an etiology for her spells.  3. Spot EEG at [facility]: This study is within normal limits. No seizures or epileptiform discharges were seen throughout the recording.  4. Sent to [facility] for LTM EEG to assess for possible subclinical seizure activity  versus seizure focus.   Recommendations: 1. LTM EEG has been ordered 2. Continue medical evaluation for other possible causes of her multiple episodes of passing out.   Electronically signed: Dr. Kerney Elbe 08/29/2021, 12:04 AM

## 2021-08-29 NOTE — Evaluation (Signed)
Physical Therapy Evaluation Patient Details Name: [name] MRN: [id number] DOB: 10-Sep-1933 Today's Date: 08/29/2021  History of Present Illness  Pt is an 88 y/o female admitted secondary to syncopal episode and decreased responsiveness. PMH includes dementia, COPD, dCHF, CKD, DM, and HTN.  Clinical Impression  Pt admitted secondary to problem above with deficits below. Pt requiring min guard to stand and take side steps at EOB. Pt limited as she was hooked up to long term EEG monitor. Pt with episode of head/neck tremors upon sitting up first time. Had pt return to supine where they subsided. On second attempt, tremors only lasted for brief period. Notified RN. Anticipate pt will progress well once symptoms improve and pt reports family can assist as needed. Recommending HHPT at d/c to address mobility deficits. Will continue to follow acutely.        Recommendations for follow up therapy are one component of a multi-disciplinary discharge planning process, led by the attending physician.  Recommendations may be updated based on patient status, additional functional criteria and insurance authorization.  Follow Up Recommendations Home health PT (as long as family can provide necessary support)    Assistance Recommended at Discharge Frequent or constant Supervision/Assistance  Patient can return home with the following  A little help with walking and/or transfers;A little help with bathing/dressing/bathroom;Assistance with cooking/housework;Assist for transportation;Help with stairs or ramp for entrance    Equipment Recommendations None recommended by PT  Recommendations for Other Services  OT consult    Functional Status Assessment Patient has had a recent decline in their functional status and demonstrates the ability to make significant improvements in function in a reasonable and predictable amount of time.     Precautions / Restrictions Precautions Precautions:  Fall Restrictions Weight Bearing Restrictions: No      Mobility  Bed Mobility Overal bed mobility: Needs Assistance Bed Mobility: Supine to Sit, Sit to Supine     Supine to sit: Supervision Sit to supine: Supervision   General bed mobility comments: supervision for safety and line management. Upon 1st time sitting up, pt with tremors noted in head and had pt lay back down where tremors subsided. Had pt sit up a second time and the tremors lasted for brief period but then subsided. Notified RN    Transfers Overall transfer level: Needs assistance Equipment used: Rolling walker (2 wheels) Transfers: Sit to/from Stand Sit to Stand: Min guard           General transfer comment: Min guard for safety and steadying.    Ambulation/Gait Ambulation/Gait assistance: Min guard   Assistive device: Rolling walker (2 wheels)         General Gait Details: took side steps at EOB as pt connected to long term EEG monitor. Flexed posture noted, but no LOB.  Stairs            Wheelchair Mobility    Modified Rankin (Stroke Patients Only)       Balance Overall balance assessment: Needs assistance Sitting-balance support: No upper extremity supported, Feet supported Sitting balance-Leahy Scale: Fair     Standing balance support: Bilateral upper extremity supported Standing balance-Leahy Scale: Poor Standing balance comment: Reliant on BUE support                             Pertinent Vitals/Pain Pain Assessment Pain Assessment: No/denies pain    Home Living Family/patient expects to be discharged to:: Private residence Living Arrangements: Children  Available Help at Discharge: Family;Available 24 hours/day Type of Home: House Home Access: Stairs to enter Entrance Stairs-Rails: Left Entrance Stairs-Number of Steps: 3   Home Layout: One level Home Equipment: Cane - single Barista (2 wheels)      Prior Function Prior Level of Function :  Needs assist             Mobility Comments: Uses RW for mobility tasks ADLs Comments: Daughter assists with ADL tasks     Hand Dominance        Extremity/Trunk Assessment   Upper Extremity Assessment Upper Extremity Assessment: Defer to OT evaluation    Lower Extremity Assessment Lower Extremity Assessment: Generalized weakness    Cervical / Trunk Assessment Cervical / Trunk Assessment: Kyphotic  Communication   Communication: No difficulties  Cognition Arousal/Alertness: Awake/alert Behavior During Therapy: WFL for tasks assessed/performed Overall Cognitive Status: History of cognitive impairments - at baseline                                 General Comments: Dementia at baseline        General Comments General comments (skin integrity, edema, etc.): No family present    Exercises     Assessment/Plan    PT Assessment Patient needs continued PT services  PT Problem List Decreased strength;Decreased activity tolerance;Decreased balance;Decreased mobility;Decreased knowledge of use of DME;Decreased knowledge of precautions;Decreased cognition;Decreased safety awareness       PT Treatment Interventions Gait training;DME instruction;Stair training;Functional mobility training;Therapeutic activities;Therapeutic exercise;Balance training;Patient/family education    PT Goals (Current goals can be found in the Care Plan section)  Acute Rehab PT Goals Patient Stated Goal: to go home PT Goal Formulation: With patient Time For Goal Achievement: 09/12/21 Potential to Achieve Goals: Good    Frequency Min 3X/week     Co-evaluation               AM-PAC PT "6 Clicks" Mobility  Outcome Measure Help needed turning from your back to your side while in a flat bed without using bedrails?: A Little Help needed moving from lying on your back to sitting on the side of a flat bed without using bedrails?: A Little Help needed moving to and from a bed  to a chair (including a wheelchair)?: A Little Help needed standing up from a chair using your arms (e.g., wheelchair or bedside chair)?: A Little Help needed to walk in hospital room?: A Little Help needed climbing 3-5 steps with a railing? : A Lot 6 Click Score: 17    End of Session Equipment Utilized During Treatment: Gait belt Activity Tolerance: Patient tolerated treatment well Patient left: in bed;with call bell/phone within reach;with bed alarm set Nurse Communication: Mobility status PT Visit Diagnosis: Unsteadiness on feet (R26.81);Muscle weakness (generalized) (M62.81);Difficulty in walking, not elsewhere classified (R26.2)    Time: 6314-9702 PT Time Calculation (min) (ACUTE ONLY): 14 min   Charges:   PT Evaluation $PT Eval Moderate Complexity: 1 Mod          Reuel Derby, PT, DPT  Acute Rehabilitation Services  Office: [phone]   Rudean Hitt 08/29/2021, 2:58 PM

## 2021-08-29 NOTE — Progress Notes (Signed)
EEG monitor with camera was setup by EEG technician. The monitor started  around 05:20 am.   Kennyth Lose, RN

## 2021-08-30 LAB — CBC
HCT: 33 % — ABNORMAL LOW (ref 36.0–46.0)
Hemoglobin: 10.3 g/dL — ABNORMAL LOW (ref 12.0–15.0)
MCH: 29.8 pg (ref 26.0–34.0)
MCHC: 31.2 g/dL (ref 30.0–36.0)
MCV: 95.4 fL (ref 80.0–100.0)
Platelets: 167 10*3/uL (ref 150–400)
RBC: 3.46 MIL/uL — ABNORMAL LOW (ref 3.87–5.11)
RDW: 12.9 % (ref 11.5–15.5)
WBC: 4.7 10*3/uL (ref 4.0–10.5)
nRBC: 0 % (ref 0.0–0.2)

## 2021-08-30 LAB — BASIC METABOLIC PANEL
Anion gap: 10 (ref 5–15)
BUN: 32 mg/dL — ABNORMAL HIGH (ref 8–23)
CO2: 24 mmol/L (ref 22–32)
Calcium: 8.7 mg/dL — ABNORMAL LOW (ref 8.9–10.3)
Chloride: 106 mmol/L (ref 98–111)
Creatinine, Ser: 1.81 mg/dL — ABNORMAL HIGH (ref 0.44–1.00)
GFR, Estimated: 27 mL/min — ABNORMAL LOW (ref >60)
Glucose, Bld: 97 mg/dL (ref 70–99)
Potassium: 4.3 mmol/L (ref 3.5–5.1)
Sodium: 140 mmol/L (ref 135–145)

## 2021-08-30 NOTE — Progress Notes (Signed)
LTM EEG discontinued - no skin breakdown at unhook.   

## 2021-08-30 NOTE — TOC Progression Note (Signed)
Transition of Care [facility]) - Progression Note    Patient Details  Name: [name] MRN: [id number] Date of Birth: [birth year]  Transition of Care [facility]) CM/SW [place], LCSW Phone Number:[phone] 08/30/2021, 2:16 PM  Clinical Narrative:     CSW was alerted to assist with pt's transportation home. CSW spoke with granddaughter [name] to confirm address on face sheet and that she would be home when pt arrives. [name] confirmed face sheet  address and that she would be home when pt arrives.  TOC team will continue to assist with discharge planning needs. ntn        Expected Discharge Plan and Services           Expected Discharge Date: 08/30/21                                     Social Determinants of Health (SDOH) Interventions    Readmission Risk Interventions     No data to display

## 2021-08-30 NOTE — Discharge Summary (Signed)
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 PPM has been interrogated multiple times without any overt findings. Neurology requested to consult in the setting of syncope versus presyncope without clear vascular issue, concern there is a neurological component.     Transient episode of altered consciousness/metabolic encephalopathy  Cannot rule out seizure -Family noting transient episodes of altered mental status and decreased responsiveness at varying times of day over the past few weeks -Initially thought to be bradycardic response but pacemaker has been evaluated without overt findings -Neurology consulted to evaluate for possible seizure -continuous EEG unremarkable for seizure-like activity despite noted tremors yesterday afternoon on video. -Echo shows grade 1 diastolic dysfunction but otherwise unremarkable -Orthostatics initially positive from supine to seated but ultimately negative upon standing -Continue outpatient work-up with PCP for further findings given negative work-up here -PT recommending home health PT at discharge    Chronic diastolic CHF (congestive heart failure) (HCC) -Not in acute exacerbation, echo shows grade 1 diastolic dysfunction stable from previous -Appears euvolemic   Chronic kidney disease, stage 3b (HCC) -At baseline continue to advance diet as tolerated   Asthma with chronic obstructive pulmonary disease (COPD) ([place]) -Without evidence of exacerbation  -Continue home nebs, inhalers as appropriate   Essential hypertension -No home medications documented, well-controlled currently   Dementia without behavioral disturbance (HCC) - Longstanding known history of dementia and cognitive deficit -High risk for hospital delirium, family at bedside if possible  Discharge Diagnoses:  Principal Problem:   Episode of altered consciousness Active Problems:   Chronic diastolic CHF (congestive heart failure) (HCC)   Chronic kidney disease, stage 3b (HCC)   Asthma with chronic obstructive pulmonary disease (COPD) ([place])   Essential hypertension   Dementia without behavioral disturbance ([place])   Altered sensorium    Discharge Instructions  Discharge Instructions     Discharge patient   Complete by: As directed    Discharge disposition: 06-Home-Health Care Svc   Discharge patient date: 08/30/2021   Face-to-face encounter (required for Medicare/Medicaid patients)   Complete by: As directed    I Little Ishikawa certify that this patient is under my care and that I, or a nurse practitioner or physician's assistant working with me, had a face-to-face encounter that meets the physician face-to-face encounter requirements with this patient on 08/30/2021. The encounter with the patient was in whole, or in part for the following medical condition(s) which is the primary reason for home health care (List medical condition): ambulatory dysfunction   The encounter with the patient was in whole, or in part, for the following medical condition, which is the primary reason for home health care:  ambulatory dysfunction   I certify that, based on my findings, the following services are medically necessary home health services: Physical therapy  Reason for Medically Necessary Home Health Services:  Therapy- Instruction on use of Assistive Device for Ambulation on all Surfaces Therapy- Gait Training, Transfer Training and Stair Training     My clinical findings support the need for the above services: Unable to leave home safely without assistance and/or assistive device   Further, I certify that my clinical findings support that this patient is homebound due to: Unable to leave home safely without assistance   Home Health   Complete by: As directed    To provide the following care/treatments: PT      Allergies as of 08/30/2021       Reactions   Aspirin Other (See Comments)   Triggers asthma        Medication List     STOP taking these medications    escitalopram 10 MG tablet Commonly known as: LEXAPRO   feeding supplement Liqd   sodium zirconium cyclosilicate 10 g Pack packet Commonly known as: LOKELMA       TAKE these medications    acetaminophen 500 MG tablet Commonly known as: TYLENOL Take 500 mg by mouth every 6 (six) hours as needed for moderate pain.   albuterol 108 (90 Base) MCG/ACT inhaler Commonly known as: VENTOLIN HFA Inhale 2 puffs into the lungs every 4 (four) hours as needed for wheezing or shortness of breath.   alendronate 70 MG tablet Commonly known as: FOSAMAX Take 70 mg by mouth once a week. Sundays   busPIRone 5 MG tablet Commonly known as: BUSPAR Take 1 tablet (5 mg total) by mouth 3 (three) times daily.   donepezil 10 MG tablet Commonly known as: ARICEPT Take 1 tablet (10 mg total) by mouth at bedtime.   loratadine 10 MG tablet Commonly known as: CLARITIN Take 10 mg by mouth daily as needed for allergies.   memantine 28 MG Cp24 24 hr capsule Commonly known as: NAMENDA XR Take 1 capsule (28 mg total) by mouth every  morning.   ondansetron 4 MG disintegrating tablet Commonly known as: Zofran ODT 4mg  ODT q4 hours prn nausea/vomit What changed:  how much to take how to take this when to take this reasons to take this additional instructions   pravastatin 10 MG tablet Commonly known as: PRAVACHOL Take 10 mg by mouth at bedtime.        Allergies  Allergen Reactions   Aspirin Other (See Comments)    Triggers asthma    Consultations: Neurology  Procedures/Studies: Overnight EEG with video  Result Date: 08/29/2021 Lora Havens, MD     08/30/2021  9:43 AM Patient Name: [name] MRN: [id number] Epilepsy Attending: Lora Havens Referring Physician/Provider: Kerney Elbe, MD Duration: 08/29/2021 0352 to 08/30/2021 0352  Patient history: 88yo F with episodes of decreased responsiveness for the past several weeks of unknown etiology. EEG to evaluate for seizure.  Level of alertness: Awake, asleep  AEDs during EEG study: None  Technical aspects: This EEG study was done with scalp electrodes positioned according to the 10-20 International system of electrode placement. Electrical activity was acquired at a sampling rate of 500Hz  and reviewed with a high frequency filter of 70Hz  and a low frequency filter of 1Hz . EEG data were recorded continuously and digitally stored.  Description: The posterior dominant rhythm consists of 9-10 Hz activity of moderate voltage (25-35 uV) seen predominantly in posterior head regions, symmetric and reactive to eye opening and eye closing. Sleep was characterized by vertex waves, sleep spindles (12 to 14 Hz), maximal  frontocentral region. Hyperventilation and photic stimulation were not performed.   Of note, parts of study was difficult to study due to significant myogenic artifact.  IMPRESSION: This study is within normal limits. No seizures or epileptiform discharges were seen throughout the recording.  Lora Havens    EEG adult  Result Date:  08/28/2021 Lora Havens, MD     08/28/2021  6:25 PM Patient Name: AMBERLEA SPAGNUOLO MRN: [id number] Epilepsy Attending: Lora Havens Referring Physician/Provider: Vernelle Emerald, MD Date: 08/28/2021 Duration: 24.50 mins Patient history: 88yo F with episodes of decreased responsiveness for the past several weeks of unknown etiology. EEG to evaluate for seizure. Level of alertness: Awake, asleep AEDs during EEG study: None Technical aspects: This EEG study was done with scalp electrodes positioned according to the 10-20 International system of electrode placement. Electrical activity was acquired at a sampling rate of 500Hz  and reviewed with a high frequency filter of 70Hz  and a low frequency filter of 1Hz . EEG data were recorded continuously and digitally stored. Description: The posterior dominant rhythm consists of 9-10 Hz activity of moderate voltage (25-35 uV) seen predominantly in posterior head regions, symmetric and reactive to eye opening and eye closing. Sleep was characterized by vertex waves, sleep spindles (12 to 14 Hz), maximal frontocentral region. Hyperventilation and photic stimulation were not performed.   IMPRESSION: This study is within normal limits. No seizures or epileptiform discharges were seen throughout the recording. Lora Havens   ECHOCARDIOGRAM COMPLETE  Result Date: 08/28/2021    ECHOCARDIOGRAM REPORT   Patient Name:   ABIHA LUKEHART Date of Exam: 08/28/2021 Medical Rec #:  [id number]      Height:       64.0 in Accession #:    [id number]     Weight:       120.1 lb Date of Birth:  1933-09-25      BSA:          1.575 m Patient Age:    10 years       BP:           145/70 mmHg Patient Gender: F              HR:           68 bpm. Exam Location:  Inpatient Procedure: 2D Echo, Cardiac Doppler and Color Doppler Indications:    Syncope  History:        Patient has prior history of Echocardiogram examinations, most                 recent 12/17/2017. CHF; Risk Factors:Hypertension and  Diabetes.  Sonographer:    Jefferey Pica Referring Phys: [id number] [place]  1. Left ventricular ejection fraction, by estimation, is 60 to 65%. The left ventricle has normal function. The left ventricle has no regional wall motion abnormalities. There is mild concentric left ventricular hypertrophy. Left ventricular diastolic parameters are consistent with Grade I diastolic dysfunction (impaired relaxation). Elevated left atrial pressure.  2. Right ventricular systolic function is mildly reduced. The right ventricular size is normal. Mildly increased right ventricular wall thickness. There is normal pulmonary artery systolic pressure.  3. The mitral valve is normal in structure. Mild mitral valve regurgitation. No evidence of mitral stenosis.  4. The aortic valve is tricuspid. Aortic valve regurgitation is trivial. No aortic stenosis is present.  5. There is moderate dilatation of the ascending aorta, measuring 41 mm.  6. The inferior vena cava is normal  in size with greater than 50% respiratory variability, suggesting right atrial pressure of 3 mmHg. FINDINGS  Left Ventricle: Left ventricular ejection fraction, by estimation, is 60 to 65%. The left ventricle has normal function. The left ventricle has no regional wall motion abnormalities. The left ventricular internal cavity size was normal in size. There is  mild concentric left ventricular hypertrophy. Left ventricular diastolic parameters are consistent with Grade I diastolic dysfunction (impaired relaxation). Elevated left atrial pressure. Right Ventricle: The right ventricular size is normal. Mildly increased right ventricular wall thickness. Right ventricular systolic function is mildly reduced. There is normal pulmonary artery systolic pressure. The tricuspid regurgitant velocity is 2.36 m/s, and with an assumed right atrial pressure of 5 mmHg, the estimated right ventricular systolic pressure is 41.9 mmHg. Left Atrium: Left atrial  size was normal in size. Right Atrium: Right atrial size was normal in size. Pericardium: There is no evidence of pericardial effusion. Presence of epicardial fat layer. Mitral Valve: The mitral valve is normal in structure. Mild mitral valve regurgitation. No evidence of mitral valve stenosis. Tricuspid Valve: The tricuspid valve is normal in structure. Tricuspid valve regurgitation is mild . No evidence of tricuspid stenosis. Aortic Valve: The aortic valve is tricuspid. Aortic valve regurgitation is trivial. Aortic regurgitation PHT measures 905 msec. No aortic stenosis is present. Aortic valve peak gradient measures 6.8 mmHg. Pulmonic Valve: The pulmonic valve was normal in structure. Pulmonic valve regurgitation is not visualized. No evidence of pulmonic stenosis. Aorta: The aortic root is normal in size and structure. There is moderate dilatation of the ascending aorta, measuring 41 mm. Venous: The inferior vena cava is normal in size with greater than 50% respiratory variability, suggesting right atrial pressure of 3 mmHg. IAS/Shunts: No atrial level shunt detected by color flow Doppler. Additional Comments: A device lead is visualized.  LEFT VENTRICLE PLAX 2D LVIDd:         4.00 cm   Diastology LVIDs:         2.30 cm   LV e' medial:    3.43 cm/s LV PW:         1.30 cm   LV E/e' medial:  20.7 LV IVS:        1.10 cm   LV e' lateral:   5.78 cm/s LVOT diam:     1.80 cm   LV E/e' lateral: 12.3 LV SV:         55 LV SV Index:   35 LVOT Area:     2.54 cm  RIGHT VENTRICLE            IVC RV S prime:     7.88 cm/s  IVC diam: 1.30 cm TAPSE (M-mode): 1.6 cm LEFT ATRIUM             Index        RIGHT ATRIUM           Index LA diam:        2.90 cm 1.84 cm/m   RA Area:     14.10 cm LA Vol (A2C):   28.8 ml 18.28 ml/m  RA Volume:   34.30 ml  21.78 ml/m LA Vol (A4C):   29.2 ml 18.54 ml/m LA Biplane Vol: 30.2 ml 19.17 ml/m  AORTIC VALVE                 PULMONIC VALVE AV Area (Vmax): 1.74 cm     PV Vmax:       0.66 m/s AV  Vmax:        130.00 cm/s  PV Peak grad:  1.8 mmHg AV Peak Grad:   6.8 mmHg LVOT Vmax:      88.80 cm/s LVOT Vmean:     63.300 cm/s LVOT VTI:       0.217 m AI PHT:         905 msec  AORTA Ao Root diam: 2.80 cm Ao Asc diam:  4.05 cm MITRAL VALVE                TRICUSPID VALVE MV Area (PHT): 2.34 cm     TR Peak grad:   22.3 mmHg MV Decel Time: 324 msec     TR Vmax:        236.00 cm/s MV E velocity: 71.10 cm/s MV A velocity: 101.00 cm/s  SHUNTS MV E/A ratio:  0.70         Systemic VTI:  0.22 m                             Systemic Diam: 1.80 cm Kardie Tobb DO Electronically signed by Berniece Salines DO Signature Date/Time: 08/28/2021/10:48:37 AM    Final    DG Chest 2 View  Result Date: 08/27/2021 CLINICAL DATA:  Syncopal episode with hypotension. EXAM: CHEST - 2 VIEW COMPARISON:  PA Lat 08/25/2021 FINDINGS: There is mild cardiomegaly without evidence of CHF. There is aortic tortuosity and atherosclerosis with stable mediastinal configuration. The lungs are slightly emphysematous but clear. There are multiple overlying monitor wires. Left chest dual lead pacing system and wire insertions are unaltered. Osteopenia and thoracic spondylosis. Bilateral shoulder DJD. IMPRESSION: No evidence of acute chest disease. Stable COPD chest with cardiomegaly. Aortic atherosclerosis. Electronically Signed   By: Telford Nab M.D.   On: 08/27/2021 21:58   CT HEAD WO CONTRAST  Result Date: 08/25/2021 CLINICAL DATA:  Provided history: Head trauma, minor. Additional history provided: Syncope/fall. EXAM: CT HEAD WITHOUT CONTRAST TECHNIQUE: Contiguous axial images were obtained from the base of the skull through the vertex without intravenous contrast. RADIATION DOSE REDUCTION: This exam was performed according to the departmental dose-optimization program which includes automated exposure control, adjustment of the mA and/or kV according to patient size and/or use of iterative reconstruction technique. COMPARISON:  Prior head CT  examinations 05/10/2019 and earlier. Brain MRI 11/22/2017. FINDINGS: Brain: Mild cerebral and cerebellar atrophy. 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 Partially empty sella turcica. There is no acute intracranial hemorrhage. No demarcated cortical infarct. No extra-axial fluid collection. No evidence of an intracranial mass. No midline shift. Vascular: No hyperdense vessel. Atherosclerotic calcifications. Skull: No fracture or aggressive osseous lesion. Sinuses/Orbits: No mass or acute finding within the imaged orbits. Nonspecific chronic calcifications along both optic nerve sheaths. Postsurgical appearance of the paranasal sinuses. Trace mucosal thickening within the frontoethmoidal recesses, bilaterally. Opacification of two left ethmoid air cells. Small-volume frothy secretions within the bilateral sphenoid sinuses. Minimal mucosal thickening within the maxillary sinuses at the imaged levels. IMPRESSION: No evidence of acute intracranial abnormality. 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 Mild generalized parenchymal atrophy. Paranasal sinus disease, as described. Electronically Signed   By: Kellie Simmering D.O.   On: 08/25/2021 16:45   DG Chest 2 View  Result Date: 08/25/2021 CLINICAL DATA:  Syncope EXAM: CHEST - 2 VIEW COMPARISON:  Radiograph 08/11/2021 FINDINGS: Unchanged cardiomediastinal silhouette and dual chamber pacemaker leads. There is no focal airspace disease. There  is no pleural effusion. No pneumothorax. Bilateral shoulder osteoarthritis. Thoracic spondylosis. No acute osseous abnormality. IMPRESSION: No evidence of acute cardiopulmonary disease. Electronically Signed   By: Maurine Simmering M.D.   On: 08/25/2021 15:38   CUP PACEART REMOTE DEVICE CHECK  Result Date: 08/13/2021 Scheduled remote reviewed. Normal device function.  2 PMT appropriately treated and terminated Next remote 91 days. LA  DG Chest Port 1  View  Result Date: 08/11/2021 CLINICAL DATA:  Syncope EXAM: PORTABLE CHEST 1 VIEW COMPARISON:  12/09/2020 FINDINGS: Single frontal view of the chest demonstrates a stable dual lead pacemaker. Cardiac silhouette is unremarkable. No acute airspace disease, effusion, or pneumothorax. No acute bony abnormalities. IMPRESSION: 1. Stable chest, no acute process. Electronically Signed   By: Randa Ngo M.D.   On: 08/11/2021 21:32     Subjective: No acute issues or events overnight   Discharge Exam: Vitals:   08/30/21 0750 08/30/21 1100  BP: 134/69   Pulse: 60   Resp: 18   Temp: (!) 97.4 F (36.3 C)   SpO2:  100%   Vitals:   08/29/21 2328 08/30/21 0454 08/30/21 0750 08/30/21 1100  BP: (!) 151/80 (!) 141/71 134/69   Pulse: 62 60 60   Resp: 18 16 18    Temp: 98.2 F (36.8 C) (!) 97.5 F (36.4 C) (!) 97.4 F (36.3 C)   TempSrc: Oral Oral Oral   SpO2: 100%   100%  Height:        General: Pt is alert, awake, not in acute distress Cardiovascular: RRR, S1/S2 +, no rubs, no gallops Respiratory: CTA bilaterally, no wheezing, no rhonchi Abdominal: Soft, NT, ND, bowel sounds + Extremities: no edema, no cyanosis    The results of significant diagnostics from this hospitalization (including imaging, microbiology, ancillary and laboratory) are listed below for reference.     Microbiology: Recent Results (from the past 240 hour(s))  Urine Culture     Status: Abnormal   Collection Time: 08/25/21 10:26 PM   Specimen: Urine, Clean Catch  Result Value Ref Range Status   Specimen Description URINE, CLEAN CATCH  Final   Special Requests   Final    NONE Performed at [facility], [address], [place], [place] [address]    Culture MULTIPLE SPECIES PRESENT, SUGGEST RECOLLECTION (A)  Final   Report Status 08/27/2021 FINAL  Final     Labs: BNP (last 3 results) No results for input(s): "BNP" in the last 8760 hours. Basic Metabolic Panel: Recent Labs  Lab 08/25/21 1510  08/27/21 1800 08/28/21 0410 08/30/21 0647  NA 142 141 142 140  K 4.5 4.0 4.9 4.3  CL 112* 117* 109 106  CO2 21* 21* 28 24  GLUCOSE 132* 153* 98 97  BUN 32* 27* 36* 32*  CREATININE 2.07* 1.20* 1.79* 1.81*  CALCIUM 9.0 6.7* 9.3 8.7*  MG  --   --  1.9  --    Liver Function Tests: Recent Labs  Lab 08/25/21 1510 08/28/21 0410  AST 42* 16  ALT 18 13  ALKPHOS 65 59  BILITOT 0.4 0.5  PROT 6.5 6.8  ALBUMIN 3.3* 3.4*   No results for input(s): "LIPASE", "AMYLASE" in the last 168 hours. No results for input(s): "AMMONIA" in the last 168 hours. CBC: Recent Labs  Lab 08/25/21 1510 08/27/21 1800 08/28/21 0410 08/30/21 0647  WBC 6.5 7.0 7.0 4.7  NEUTROABS 4.7 5.7 4.3  --   HGB 10.6* 9.8* 10.9* 10.3*  HCT 36.5 32.5* 34.3* 33.0*  MCV 103.1* 98.2 95.8 95.4  PLT 196 146* 189 167   Cardiac Enzymes: No results for input(s): "CKTOTAL", "CKMB", "CKMBINDEX", "TROPONINI" in the last 168 hours. BNP: Invalid input(s): "POCBNP" CBG: Recent Labs  Lab 08/25/21 1511 08/27/21 1909  GLUCAP 151* 175*   D-Dimer No results for input(s): "DDIMER" in the last 72 hours. Hgb A1c No results for input(s): "HGBA1C" in the last 72 hours. Lipid Profile No results for input(s): "CHOL", "HDL", "LDLCALC", "TRIG", "CHOLHDL", "LDLDIRECT" in the last 72 hours. Thyroid function studies No results for input(s): "TSH", "T4TOTAL", "T3FREE", "THYROIDAB" in the last 72 hours.  Invalid input(s): "FREET3" Anemia work up No results for input(s): "VITAMINB12", "FOLATE", "FERRITIN", "TIBC", "IRON", "RETICCTPCT" in the last 72 hours. Urinalysis    Component Value Date/Time   COLORURINE YELLOW 08/25/2021 2226   APPEARANCEUR HAZY (A) 08/25/2021 2226   LABSPEC 1.015 08/25/2021 2226   PHURINE 5.0 08/25/2021 2226   GLUCOSEU NEGATIVE 08/25/2021 2226   HGBUR SMALL (A) 08/25/2021 2226   BILIRUBINUR NEGATIVE 08/25/2021 2226   KETONESUR NEGATIVE 08/25/2021 2226   PROTEINUR NEGATIVE 08/25/2021 2226   UROBILINOGEN  1.0 01/09/2015 1620   NITRITE NEGATIVE 08/25/2021 2226   LEUKOCYTESUR LARGE (A) 08/25/2021 2226   Sepsis Labs Recent Labs  Lab 08/25/21 1510 08/27/21 1800 08/28/21 0410 08/30/21 0647  WBC 6.5 7.0 7.0 4.7   Microbiology Recent Results (from the past 240 hour(s))  Urine Culture     Status: Abnormal   Collection Time: 08/25/21 10:26 PM   Specimen: Urine, Clean Catch  Result Value Ref Range Status   Specimen Description URINE, CLEAN CATCH  Final   Special Requests   Final    NONE Performed at [facility], [place] [address], [place], [place] [address]    Culture MULTIPLE SPECIES PRESENT, SUGGEST RECOLLECTION (A)  Final   Report Status 08/27/2021 FINAL  Final     Time coordinating discharge: Over 30 minutes  SIGNED:   Little Ishikawa, DO [facility] 08/30/2021, 1:27 PM Pager   If 7PM-7AM, please contact night-coverage [email or web address]

## 2021-08-30 NOTE — Progress Notes (Deleted)
.  ltm

## 2021-08-30 NOTE — Plan of Care (Signed)
Neurology Plan of [place] MR# [id number] 08/30/2021  Patient was transferred to [facility] for LTM.  Both routine EEG and LTM were negative for seizure and epileptiform discharges.  Patient has pacemaker and cannot have MRI. CT head did not show mass or hemorrhage.  At this time there is no objective evidence to start antiseizure medication which may worsen her cognition in setting of dementia.   May discontinue LTM   Electronically signed by:  Lynnae Sandhoff, MD Page: [phone] 08/30/2021, 12:17 PM

## 2021-08-30 NOTE — Evaluation (Signed)
Occupational Therapy Evaluation Patient Details Name: [name] MRN: [id number] DOB: March 27, 1933 Today's Date: 08/30/2021   History of Present Illness Pt is an 88 y/o female admitted secondary to syncopal episode and decreased responsiveness. PMH includes dementia, COPD, dCHF, CKD, DM, and HTN.   Clinical Impression   Pt PTA: Pt living family and reports assistance as needed. Pt currently, performing all ADL with minA overall to manage lower body dressing for toileting task and minguardA for ADL mobility with RW. Pt BP supine: 131/74, 60 BPM; sitting 104/76, 68 BPM; standing:104/61, 72 BPM; standing x3 mins 120/56, 75 BPM Pt appears at functional baseline for ADL and functional mobility. OT signing off.     Recommendations for follow up therapy are one component of a multi-disciplinary discharge planning process, led by the attending physician.  Recommendations may be updated based on patient status, additional functional criteria and insurance authorization.   Follow Up Recommendations  No OT follow up    Assistance Recommended at Discharge Intermittent Supervision/Assistance  Patient can return home with the following A little help with walking and/or transfers;A little help with bathing/dressing/bathroom;Assistance with cooking/housework;Assist for transportation;Help with stairs or ramp for entrance    Functional Status Assessment  Patient has had a recent decline in their functional status and demonstrates the ability to make significant improvements in function in a reasonable and predictable amount of time.  Equipment Recommendations  BSC/3in1    Recommendations for Other Services       Precautions / Restrictions Precautions Precautions: Fall Restrictions Weight Bearing Restrictions: No      Mobility Bed Mobility Overal bed mobility: Needs Assistance Bed Mobility: Supine to Sit, Sit to Supine     Supine to sit: Supervision Sit to supine: Supervision   General  bed mobility comments: supervision for safety and line management. Upon 1st time sitting up, pt with tremors noted in head and had pt lay back down where tremors subsided. Had pt sit up a second time and the tremors lasted for brief period but then subsided. Notified RN    Transfers Overall transfer level: Needs assistance Equipment used: Rolling walker (2 wheels) Transfers: Sit to/from Stand Sit to Stand: Min guard           General transfer comment: Min guard for safety and steadying.      Balance Overall balance assessment: Needs assistance Sitting-balance support: No upper extremity supported, Feet supported Sitting balance-Leahy Scale: Fair     Standing balance support: Bilateral upper extremity supported Standing balance-Leahy Scale: Poor Standing balance comment: Reliant on BUE support                           ADL either performed or assessed with clinical judgement   ADL Overall ADL's : Modified independent                                       General ADL Comments: Pt performing all ADL with minA overall to manage lower body dressing for toileting task and minguardA for ADL mobility with RW.     Vision Baseline Vision/History: 0 No visual deficits Ability to See in Adequate Light: 0 Adequate Patient Visual Report: No change from baseline Vision Assessment?: No apparent visual deficits     Perception     Praxis      Pertinent Vitals/Pain Pain Assessment Pain Assessment: No/denies pain  Hand Dominance Right   Extremity/Trunk Assessment Upper Extremity Assessment Upper Extremity Assessment: Overall WFL for tasks assessed   Lower Extremity Assessment Lower Extremity Assessment: Generalized weakness   Cervical / Trunk Assessment Cervical / Trunk Assessment: Kyphotic   Communication Communication Communication: No difficulties   Cognition Arousal/Alertness: Awake/alert Behavior During Therapy: WFL for tasks  assessed/performed Overall Cognitive Status: History of cognitive impairments - at baseline                                 General Comments: Dementia at baseline; answered all questions appropriately. Perserverating slightly on clean socks.     General Comments  VSS on RA    Exercises     Shoulder Instructions      Home Living Family/patient expects to be discharged to:: Private residence Living Arrangements: Children Available Help at Discharge: Family;Available 24 hours/day Type of Home: House Home Access: Stairs to enter [employer] of Steps: 3 Entrance Stairs-Rails: Left Home Layout: One level     Bathroom Shower/Tub: Teacher, early years/pre: Handicapped height     Home Equipment: [place] - single Barista (2 wheels)          Prior Functioning/Environment Prior Level of Function : Needs assist             Mobility Comments: Uses RW for mobility tasks ADLs Comments: Daughter assists with ADL tasks        OT Problem List: Decreased activity tolerance      OT Treatment/Interventions: Self-care/ADL training;Therapeutic exercise;Energy conservation;DME and/or AE instruction;Therapeutic activities;Patient/family education;Balance training    OT Goals(Current goals can be found in the care plan section) Acute Rehab OT Goals Patient Stated Goal: to go home OT Goal Formulation: With patient Time For Goal Achievement: 09/13/21 Potential to Achieve Goals: Fair  OT Frequency: Min 2X/week    Co-evaluation              AM-PAC OT "6 Clicks" Daily Activity     Outcome Measure Help from another person eating meals?: A Little Help from another person taking care of personal grooming?: A Little Help from another person toileting, which includes using toliet, bedpan, or urinal?: A Little Help from another person bathing (including washing, rinsing, drying)?: A Little Help from another person to put on and taking  off regular upper body clothing?: A Little Help from another person to put on and taking off regular lower body clothing?: A Little 6 Click Score: 18   End of Session Equipment Utilized During Treatment: Gait belt;Rolling walker (2 wheels) Nurse Communication: Mobility status  Activity Tolerance: Patient tolerated treatment well Patient left: in bed;with call bell/phone within reach;with bed alarm set  OT Visit Diagnosis: Unsteadiness on feet (R26.81);Muscle weakness (generalized) (M62.81)                Time: 1120-1200 OT Time Calculation (min): 40 min Charges:  OT General Charges $OT Visit: 1 Visit OT Evaluation $OT Eval Moderate Complexity: 1 Mod OT Treatments $Self Care/Home Management : 8-22 mins $Therapeutic Activity: 8-22 mins  Jefferey Pica, OTR/L Acute Rehabilitation Services Office: [phone]  [id number] 08/30/2021, 1:59 PM

## 2021-08-30 NOTE — Progress Notes (Signed)
Received referral to assist with [facility]. Contacted pt's granddaughter [name]) to discuss referral. No preference for a [facility] agency. Contacted Carolyn with [facility] and Georgina Snell with [facility]. Georgina Snell accepted the referral and [name] is agreeable with agency.

## 2021-08-30 NOTE — Progress Notes (Signed)
LTM maint complete - no skin breakdown under: Fp1 Fp2 F4 Serviced O1 P3 P7 O1 [facility] monitored, Event button test confirmed by [facility].

## 2021-08-30 NOTE — Progress Notes (Signed)
Discharged home via medical transport. Attempt to contact granddaughter, [name], but no answer and no available VM. Patient VS stable. Medicated prior to discharge. Ambulated to stretcher with 1 person assist. No acute distress or needs noted.

## 2021-08-30 NOTE — Procedures (Addendum)
Patient Name: [name]  MRN: [id number]  Epilepsy Attending: Lora Havens  Referring Physician/Provider: Kerney Elbe, MD  Duration: 08/30/2021 0352 to 08/30/2021 1343   Patient history: 88yo F with episodes of decreased responsiveness for the past several weeks of unknown etiology. EEG to evaluate for seizure.   Level of alertness: Awake, asleep   AEDs during EEG study: None   Technical aspects: This EEG study was done with scalp electrodes positioned according to the 10-20 International system of electrode placement. Electrical activity was acquired at a sampling rate of 500Hz  and reviewed with a high frequency filter of 70Hz  and a low frequency filter of 1Hz . EEG data were recorded continuously and digitally stored.    Description: The posterior dominant rhythm consists of 9-10 Hz activity of moderate voltage (25-35 uV) seen predominantly in posterior head regions, symmetric and reactive to eye opening and eye closing. Sleep was characterized by vertex waves, sleep spindles (12 to 14 Hz), maximal frontocentral region. Hyperventilation and photic stimulation were not performed.      Of note, parts of study was difficult to study due to significant myogenic artifact.   IMPRESSION: This study is within normal limits. No seizures or epileptiform discharges were seen throughout the recording.   [name] [name]

## 2021-08-30 NOTE — Progress Notes (Signed)
Physical Therapy Treatment Patient Details Name: [name] MRN: [id number] DOB: [birth year] Today's Date: 08/30/2021   History of Present Illness Pt is an 88 y/o female admitted secondary to syncopal episode and decreased responsiveness. PMH includes dementia, COPD, dCHF, CKD, DM, and HTN.    PT Comments    Pt received supine and agreeable to session with good progress, however limited by continuous EEG cord/monitor. Pt supervision for all bed mobility and able to come to sit EOB without use of bedrails. Pt min guard to come to standing with RW with good hand placement and no c/o dizziness. Pt able to ambulate increased distance this session with min guard and assist only for line management; pt with tendency for flexed trunk, cues for correction but pt unable, no overt LOB noted. Pt with fair tolerance for balance exercises at EOB with minimal LOB with pt able to correct in all instances. Current plan remains appropriate to address deficits and maximize functional independence and safety. Pt continues to benefit from skilled PT services to progress toward functional mobility goals.    Recommendations for follow up therapy are one component of a multi-disciplinary discharge planning process, led by the attending physician.  Recommendations may be updated based on patient status, additional functional criteria and insurance authorization.  Follow Up Recommendations  Home health PT (as long as family can provide necessary support)     Assistance Recommended at Discharge Frequent or constant Supervision/Assistance  Patient can return home with the following A little help with walking and/or transfers;A little help with bathing/dressing/bathroom;Assistance with cooking/housework;Assist for transportation;Help with stairs or ramp for entrance   Equipment Recommendations  None recommended by PT    Recommendations for Other Services       Precautions / Restrictions Precautions Precautions:  Fall Restrictions Weight Bearing Restrictions: No     Mobility  Bed Mobility Overal bed mobility: Needs Assistance Bed Mobility: Supine to Sit, Sit to Supine     Supine to sit: Supervision Sit to supine: Supervision   General bed mobility comments: supervision for safety and line management. no use of bedrails    Transfers Overall transfer level: Needs assistance Equipment used: Rolling walker (2 wheels) Transfers: Sit to/from Stand Sit to Stand: Min guard           General transfer comment: Min guard for safety and steadying.    Ambulation/Gait Ambulation/Gait assistance: Min guard Gait Distance (Feet): 60 Feet Assistive device: Rolling walker (2 wheels) Gait Pattern/deviations: Step-through pattern, Trunk flexed       General Gait Details: min guard for safety, no LOB, cues for upright trunk   Stairs Stairs:  (unable to trial as pt with limited cord lenght connected to EEG monitor)           Wheelchair Mobility    Modified Rankin (Stroke Patients Only)       Balance Overall balance assessment: Needs assistance Sitting-balance support: No upper extremity supported, Feet supported Sitting balance-Leahy Scale: Fair     Standing balance support: Single extremity supported Standing balance-Leahy Scale: Fair Standing balance comment: Reliant on BUE support for ambulation, able to static stand without UE support ~30 seconds                            Cognition Arousal/Alertness: Awake/alert Behavior During Therapy: WFL for tasks assessed/performed Overall Cognitive Status: History of cognitive impairments - at baseline  General Comments: Dementia at baseline        Exercises Other Exercises Other Exercises: static stance with feet togehter and no UE support, SL stance with BUE support, SL stance with single UE support. x2 all sides    General Comments General comments (skin integrity,  edema, etc.): VSS on RA      Pertinent Vitals/Pain      Home Living Family/patient expects to be discharged to:: Private residence Living Arrangements: Children Available Help at Discharge: Family;Available 24 hours/day Type of Home: House Home Access: Stairs to enter Entrance Stairs-Rails: Left Entrance Stairs-Number of Steps: 3   Home Layout: One level Home Equipment: Cane - single Barista (2 wheels)      Prior Function            PT Goals (current goals can now be found in the care plan section) Acute Rehab PT Goals PT Goal Formulation: With patient Time For Goal Achievement: 09/12/21    Frequency    Min 3X/week      PT Plan      Co-evaluation              AM-PAC PT "6 Clicks" Mobility   Outcome Measure  Help needed turning from your back to your side while in a flat bed without using bedrails?: A Little Help needed moving from lying on your back to sitting on the side of a flat bed without using bedrails?: A Little Help needed moving to and from a bed to a chair (including a wheelchair)?: A Little Help needed standing up from a chair using your arms (e.g., wheelchair or bedside chair)?: A Little Help needed to walk in hospital room?: A Little Help needed climbing 3-5 steps with a railing? : A Little 6 Click Score: 18    End of Session Equipment Utilized During Treatment: Gait belt Activity Tolerance: Patient tolerated treatment well Patient left: in bed;with call bell/phone within reach;with bed alarm set Nurse Communication: Mobility status PT Visit Diagnosis: Unsteadiness on feet (R26.81);Muscle weakness (generalized) (M62.81);Difficulty in walking, not elsewhere classified (R26.2)     Time: 9532-0233 PT Time Calculation (min) (ACUTE ONLY): 18 min  Charges:  $Gait Training: 8-22 mins                     Radek Carnero R. PTA Acute Rehabilitation Services Office: [place] 08/30/2021, 12:25 PM

## 2021-09-03 DIAGNOSIS — N1832 Chronic kidney disease, stage 3b: Secondary | ICD-10-CM | POA: Diagnosis not present

## 2021-09-03 DIAGNOSIS — I130 Hypertensive heart and chronic kidney disease with heart failure and stage 1 through stage 4 chronic kidney disease, or unspecified chronic kidney disease: Secondary | ICD-10-CM | POA: Diagnosis not present

## 2021-09-03 DIAGNOSIS — J449 Chronic obstructive pulmonary disease, unspecified: Secondary | ICD-10-CM | POA: Diagnosis not present

## 2021-09-03 DIAGNOSIS — G9341 Metabolic encephalopathy: Secondary | ICD-10-CM | POA: Diagnosis not present

## 2021-09-03 DIAGNOSIS — I083 Combined rheumatic disorders of mitral, aortic and tricuspid valves: Secondary | ICD-10-CM | POA: Diagnosis not present

## 2021-09-03 DIAGNOSIS — Z950 Presence of cardiac pacemaker: Secondary | ICD-10-CM | POA: Diagnosis not present

## 2021-09-03 DIAGNOSIS — I5032 Chronic diastolic (congestive) heart failure: Secondary | ICD-10-CM | POA: Diagnosis not present

## 2021-09-03 DIAGNOSIS — Z9181 History of falling: Secondary | ICD-10-CM | POA: Diagnosis not present

## 2021-09-08 DIAGNOSIS — I083 Combined rheumatic disorders of mitral, aortic and tricuspid valves: Secondary | ICD-10-CM | POA: Diagnosis not present

## 2021-09-08 DIAGNOSIS — G9341 Metabolic encephalopathy: Secondary | ICD-10-CM | POA: Diagnosis not present

## 2021-09-08 DIAGNOSIS — Z950 Presence of cardiac pacemaker: Secondary | ICD-10-CM | POA: Diagnosis not present

## 2021-09-08 DIAGNOSIS — I5032 Chronic diastolic (congestive) heart failure: Secondary | ICD-10-CM | POA: Diagnosis not present

## 2021-09-08 DIAGNOSIS — I130 Hypertensive heart and chronic kidney disease with heart failure and stage 1 through stage 4 chronic kidney disease, or unspecified chronic kidney disease: Secondary | ICD-10-CM | POA: Diagnosis not present

## 2021-09-08 DIAGNOSIS — Z9181 History of falling: Secondary | ICD-10-CM | POA: Diagnosis not present

## 2021-09-08 DIAGNOSIS — N1832 Chronic kidney disease, stage 3b: Secondary | ICD-10-CM | POA: Diagnosis not present

## 2021-09-08 DIAGNOSIS — Z933 Colostomy status: Secondary | ICD-10-CM | POA: Diagnosis not present

## 2021-09-08 DIAGNOSIS — K631 Perforation of intestine (nontraumatic): Secondary | ICD-10-CM | POA: Diagnosis not present

## 2021-09-08 DIAGNOSIS — J449 Chronic obstructive pulmonary disease, unspecified: Secondary | ICD-10-CM | POA: Diagnosis not present

## 2021-09-10 DIAGNOSIS — I083 Combined rheumatic disorders of mitral, aortic and tricuspid valves: Secondary | ICD-10-CM | POA: Diagnosis not present

## 2021-09-10 DIAGNOSIS — Z9181 History of falling: Secondary | ICD-10-CM | POA: Diagnosis not present

## 2021-09-10 DIAGNOSIS — Z950 Presence of cardiac pacemaker: Secondary | ICD-10-CM | POA: Diagnosis not present

## 2021-09-10 DIAGNOSIS — I130 Hypertensive heart and chronic kidney disease with heart failure and stage 1 through stage 4 chronic kidney disease, or unspecified chronic kidney disease: Secondary | ICD-10-CM | POA: Diagnosis not present

## 2021-09-10 DIAGNOSIS — I5032 Chronic diastolic (congestive) heart failure: Secondary | ICD-10-CM | POA: Diagnosis not present

## 2021-09-10 DIAGNOSIS — N1832 Chronic kidney disease, stage 3b: Secondary | ICD-10-CM | POA: Diagnosis not present

## 2021-09-10 DIAGNOSIS — J449 Chronic obstructive pulmonary disease, unspecified: Secondary | ICD-10-CM | POA: Diagnosis not present

## 2021-09-10 DIAGNOSIS — G9341 Metabolic encephalopathy: Secondary | ICD-10-CM | POA: Diagnosis not present

## 2021-09-17 DIAGNOSIS — I083 Combined rheumatic disorders of mitral, aortic and tricuspid valves: Secondary | ICD-10-CM | POA: Diagnosis not present

## 2021-09-17 DIAGNOSIS — Z950 Presence of cardiac pacemaker: Secondary | ICD-10-CM | POA: Diagnosis not present

## 2021-09-17 DIAGNOSIS — Z9181 History of falling: Secondary | ICD-10-CM | POA: Diagnosis not present

## 2021-09-17 DIAGNOSIS — G9341 Metabolic encephalopathy: Secondary | ICD-10-CM | POA: Diagnosis not present

## 2021-09-17 DIAGNOSIS — I5032 Chronic diastolic (congestive) heart failure: Secondary | ICD-10-CM | POA: Diagnosis not present

## 2021-09-17 DIAGNOSIS — N1832 Chronic kidney disease, stage 3b: Secondary | ICD-10-CM | POA: Diagnosis not present

## 2021-09-17 DIAGNOSIS — J449 Chronic obstructive pulmonary disease, unspecified: Secondary | ICD-10-CM | POA: Diagnosis not present

## 2021-09-17 DIAGNOSIS — I130 Hypertensive heart and chronic kidney disease with heart failure and stage 1 through stage 4 chronic kidney disease, or unspecified chronic kidney disease: Secondary | ICD-10-CM | POA: Diagnosis not present

## 2021-09-18 ENCOUNTER — Encounter (HOSPITAL_COMMUNITY): Payer: Self-pay

## 2021-09-18 ENCOUNTER — Emergency Department (HOSPITAL_COMMUNITY): Payer: Medicare Other

## 2021-09-18 ENCOUNTER — Emergency Department (HOSPITAL_COMMUNITY)
Admission: EM | Admit: 2021-09-18 | Discharge: 2021-09-19 | Disposition: A | Discharge status: Home / Self Care | Payer: Medicare Other | Attending: Emergency Medicine | Admitting: Emergency Medicine

## 2021-09-18 DIAGNOSIS — N289 Disorder of kidney and ureter, unspecified: Secondary | ICD-10-CM

## 2021-09-18 DIAGNOSIS — J449 Chronic obstructive pulmonary disease, unspecified: Secondary | ICD-10-CM | POA: Insufficient documentation

## 2021-09-18 DIAGNOSIS — R404 Transient alteration of awareness: Secondary | ICD-10-CM | POA: Diagnosis not present

## 2021-09-18 DIAGNOSIS — R6889 Other general symptoms and signs: Secondary | ICD-10-CM | POA: Diagnosis not present

## 2021-09-18 DIAGNOSIS — I5030 Unspecified diastolic (congestive) heart failure: Secondary | ICD-10-CM | POA: Insufficient documentation

## 2021-09-18 DIAGNOSIS — Z743 Need for continuous supervision: Secondary | ICD-10-CM | POA: Diagnosis not present

## 2021-09-18 DIAGNOSIS — J45909 Unspecified asthma, uncomplicated: Secondary | ICD-10-CM | POA: Diagnosis not present

## 2021-09-18 DIAGNOSIS — N1832 Chronic kidney disease, stage 3b: Secondary | ICD-10-CM | POA: Insufficient documentation

## 2021-09-18 DIAGNOSIS — R42 Dizziness and giddiness: Secondary | ICD-10-CM | POA: Diagnosis not present

## 2021-09-18 DIAGNOSIS — F0390 Unspecified dementia without behavioral disturbance: Secondary | ICD-10-CM | POA: Insufficient documentation

## 2021-09-18 DIAGNOSIS — R61 Generalized hyperhidrosis: Secondary | ICD-10-CM | POA: Diagnosis not present

## 2021-09-18 DIAGNOSIS — R531 Weakness: Secondary | ICD-10-CM | POA: Diagnosis not present

## 2021-09-18 DIAGNOSIS — Z950 Presence of cardiac pacemaker: Secondary | ICD-10-CM | POA: Diagnosis not present

## 2021-09-18 DIAGNOSIS — R55 Syncope and collapse: Secondary | ICD-10-CM | POA: Diagnosis not present

## 2021-09-18 LAB — CBC WITH DIFFERENTIAL/PLATELET
Abs Immature Granulocytes: 0.01 10*3/uL (ref 0.00–0.07)
Basophils Absolute: 0 10*3/uL (ref 0.0–0.1)
Basophils Relative: 0 %
Eosinophils Absolute: 0.1 10*3/uL (ref 0.0–0.5)
Eosinophils Relative: 1 %
HCT: 38.6 % (ref 36.0–46.0)
Hemoglobin: 12.3 g/dL (ref 12.0–15.0)
Immature Granulocytes: 0 %
Lymphocytes Relative: 13 %
Lymphs Abs: 1 10*3/uL (ref 0.7–4.0)
MCH: 30.8 pg (ref 26.0–34.0)
MCHC: 31.9 g/dL (ref 30.0–36.0)
MCV: 96.5 fL (ref 80.0–100.0)
Monocytes Absolute: 0.3 10*3/uL (ref 0.1–1.0)
Monocytes Relative: 3 %
Neutro Abs: 6.4 10*3/uL (ref 1.7–7.7)
Neutrophils Relative %: 83 %
Platelets: 200 10*3/uL (ref 150–400)
RBC: 4 MIL/uL (ref 3.87–5.11)
RDW: 13.6 % (ref 11.5–15.5)
WBC: 7.8 10*3/uL (ref 4.0–10.5)
nRBC: 0 % (ref 0.0–0.2)

## 2021-09-18 MED ORDER — TETRACAINE HCL 0.5 % OP SOLN
1.0000 [drp] | Freq: Once | OPHTHALMIC | Status: AC
  Administered 2021-09-18: 1 [drp] via OPHTHALMIC
  Filled 2021-09-18: qty 4

## 2021-09-18 MED ORDER — FLUORESCEIN SODIUM 1 MG OP STRP
1.0000 | ORAL_STRIP | Freq: Once | OPHTHALMIC | Status: AC
  Administered 2021-09-18: 1 via OPHTHALMIC
  Filled 2021-09-18: qty 1

## 2021-09-18 MED ORDER — SODIUM CHLORIDE 0.9 % IV BOLUS
500.0000 mL | Freq: Once | INTRAVENOUS | Status: AC
  Administered 2021-09-18: 500 mL via INTRAVENOUS

## 2021-09-18 NOTE — ED Triage Notes (Signed)
Pt arrives via [facility] from home for near syncope. Family reported that pt has had multiple of these episodes where she becomes dizzy, diaphoretic and has near syncope.   EMS last VS - 104/66, HR 60 paced, CBG 227, 96% on RA, RR 16.

## 2021-09-19 DIAGNOSIS — Z743 Need for continuous supervision: Secondary | ICD-10-CM | POA: Diagnosis not present

## 2021-09-19 DIAGNOSIS — Z7401 Bed confinement status: Secondary | ICD-10-CM | POA: Diagnosis not present

## 2021-09-19 DIAGNOSIS — R404 Transient alteration of awareness: Secondary | ICD-10-CM | POA: Diagnosis not present

## 2021-09-19 DIAGNOSIS — R55 Syncope and collapse: Secondary | ICD-10-CM | POA: Diagnosis not present

## 2021-09-19 LAB — URINALYSIS, ROUTINE W REFLEX MICROSCOPIC
Bacteria, UA: NONE SEEN
Bilirubin Urine: NEGATIVE
Glucose, UA: NEGATIVE mg/dL
Ketones, ur: NEGATIVE mg/dL
Leukocytes,Ua: NEGATIVE
Nitrite: NEGATIVE
Protein, ur: NEGATIVE mg/dL
Specific Gravity, Urine: 1.014 (ref 1.005–1.030)
pH: 5 (ref 5.0–8.0)

## 2021-09-19 LAB — BASIC METABOLIC PANEL
Anion gap: 6 (ref 5–15)
BUN: 36 mg/dL — ABNORMAL HIGH (ref 8–23)
CO2: 26 mmol/L (ref 22–32)
Calcium: 9 mg/dL (ref 8.9–10.3)
Chloride: 110 mmol/L (ref 98–111)
Creatinine, Ser: 1.95 mg/dL — ABNORMAL HIGH (ref 0.44–1.00)
GFR, Estimated: 25 mL/min — ABNORMAL LOW (ref >60)
Glucose, Bld: 99 mg/dL (ref 70–99)
Potassium: 4.6 mmol/L (ref 3.5–5.1)
Sodium: 142 mmol/L (ref 135–145)

## 2021-09-19 LAB — TROPONIN I (HIGH SENSITIVITY): Troponin I (High Sensitivity): 4 ng/L (ref <18)

## 2021-09-19 NOTE — ED Notes (Signed)
Spoke with [name], granddaughter, with pt permission.  [facility] unable to provide transportation. She requests [facility] transport. Discussed d/c instructions, verbalized understanding. Call placed to [facility], placed on transport list.

## 2021-09-19 NOTE — ED Provider Notes (Signed)
[facility] DEPT Provider Note   CSN: [id number] Arrival date & time: 09/18/21  1749     History  Chief Complaint  Patient presents with   Near Syncope    [name] is a 88 y.o. female.  Presents for possible syncope home.  Patient does not recall event, she is pleasantly demented c/w baseline. Currently she denies any current pain or concerns. Called patient's granddaughter, [name], to obtain further history. Per granddaughter, patient was sitting at the table for dinner when said she "didn't feel" well, and put her head down on the table. Family immediately called EMS. Unknown if LOC. No head trauma or fall. Per granddaughter similar episodes have occurred a few times over the last month. Of note, patient was recently connected with home health two weeks ago. They are scheduled to come weekly on Mondays, last home visit 09/15/2021. At home granddaughter reports No SOB or complaints of pain.   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 PPM has been interrogated multiple times without any overt findings. continuous EEG unremarkable for seizure-like activity   HPI     Home Medications Prior to Admission medications   Medication Sig Start Date End Date Taking? Authorizing Provider  acetaminophen (TYLENOL) 500 MG tablet Take 500 mg by mouth every 6 (six) hours as needed for moderate pain.    [provider]  albuterol (VENTOLIN HFA) 108 (90 Base) MCG/ACT inhaler  Inhale 2 puffs into the lungs every 4 (four) hours as needed for wheezing or shortness of breath. 04/29/20   Medina-Vargas, Monina C, NP  alendronate (FOSAMAX) 70 MG tablet Take 70 mg by mouth once a week. Sundays 05/30/21   [provider]  busPIRone (BUSPAR) 5 MG tablet Take 1 tablet (5 mg total) by mouth 3 (three) times daily. 04/29/20   Medina-Vargas, Monina C, NP  donepezil (ARICEPT) 10 MG tablet Take 1 tablet (10 mg total) by mouth at bedtime. 09/05/20   Nita Sells, MD  loratadine (CLARITIN) 10 MG tablet Take 10 mg by mouth daily as needed for allergies.    [provider]  memantine (NAMENDA XR) 28 MG CP24 24 hr capsule Take 1 capsule (28 mg total) by mouth every morning. 09/05/20   Nita Sells, MD  ondansetron (ZOFRAN ODT) 4 MG disintegrating tablet 4mg  ODT q4 hours prn nausea/vomit Patient taking differently: Take 4 mg by mouth every 8 (eight) hours as needed for nausea or vomiting. 12/10/20   Mesner, Corene Cornea, MD  pravastatin (PRAVACHOL) 10 MG tablet Take 10 mg by mouth at bedtime. 08/04/21   [provider]      Allergies    Aspirin    Review of Systems   Review of Systems  Unable to perform ROS: Dementia    Physical Exam Updated Vital Signs BP (!) 128/106   Pulse 74   Temp 98.1 F (36.7 C) (Oral)   Resp (!) 29   SpO2 91%  Physical Exam Vitals and nursing note reviewed.  Constitutional:      General: She is not in acute distress.    Appearance:  She is well-developed.  HENT:     Head: Normocephalic and atraumatic.  Eyes:     Conjunctiva/sclera: Conjunctivae normal.  Cardiovascular:     Rate and Rhythm: Normal rate and regular rhythm.     Heart sounds: No murmur heard. Pulmonary:     Effort: Pulmonary effort is normal. No respiratory distress.     Breath sounds: Normal breath sounds.  Abdominal:     Palpations: Abdomen is soft.     Tenderness: There is no abdominal tenderness.  Musculoskeletal:        General: No swelling.      Cervical back: Neck supple.  Skin:    General: Skin is warm and dry.     Capillary Refill: Capillary refill takes less than 2 seconds.  Neurological:     Mental Status: She is alert.     Comments: Alert, oriented to person, place but not time  Psychiatric:        Mood and Affect: Mood normal.     ED Results / Procedures / Treatments   Labs (all labs ordered are listed, but only abnormal results are displayed) Labs Reviewed  CBC WITH DIFFERENTIAL/PLATELET  URINALYSIS, ROUTINE W REFLEX MICROSCOPIC  BASIC METABOLIC PANEL  TROPONIN I (HIGH SENSITIVITY)    EKG EKG Interpretation  Date/Time:  Thursday September 18 2021 22:00:57 EDT Ventricular Rate:  91 PR Interval:  173 QRS Duration: 94 QT Interval:  413 QTC Calculation: 403 R Axis:   -46 Text Interpretation: Sinus rhythm Supraventricular bigeminy Probable left atrial enlargement LAD, consider left anterior fascicular block Low voltage, precordial leads Abnormal R-wave progression, late transition Confirmed by Madalyn Rob [phone]) on 09/18/2021 10:46:38 PM  Radiology DG Chest 2 View  Result Date: 09/18/2021 CLINICAL DATA:  Syncope, weakness EXAM: CHEST - 2 VIEW COMPARISON:  Radiograph 08/27/2021 FINDINGS: Unchanged cardiomediastinal silhouette with dual chamber pacemaker leads. There is no focal airspace consolidation. Stable pulmonary hyperinflation there is a skin fold overlying the left chest. There is no pneumothorax. There is no acute osseous abnormality. There are bilateral shoulder degenerative changes. Thoracic spondylosis. No acute osseous abnormality. IMPRESSION: No evidence of acute cardiopulmonary disease.  Findings of COPD. Electronically Signed   By: Maurine Simmering M.D.   On: 09/18/2021 20:43    Procedures Procedures    Medications Ordered in ED Medications  fluorescein ophthalmic strip 1 strip (1 strip Right Eye Given 09/18/21 2159)  tetracaine (PONTOCAINE) 0.5 % ophthalmic solution 1 drop (1 drop Right Eye Given  09/18/21 2159)  sodium chloride 0.9 % bolus 500 mL (500 mLs Intravenous New Bag/Given 09/18/21 2354)    ED Course/ Medical Decision Making/ A&P                           Medical Decision Making Amount and/or Complexity of Data Reviewed Labs: ordered. Radiology: ordered.  Risk Prescription drug management.   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  Patient is unable to provide any significant history because of her dementia.  She currently denies any acute complaints.  Per family had episode which sounds similar to when she was admitted a few weeks ago.  Per review of the discharge summary, her work-up was all grossly negative.  This included interrogation of her pacemaker, cardiac monitoring, EEG.  Patient has not had any further episodes since being in ER.  Patient also had a ER visit on 6/12 and 5/29 for similar complaint.  Will check basic labs, interrogate pacemaker and check urinalysis.  Difficulty with obtaining blood work.  Her CBC demonstrates no significant anemia.  While awaiting additional lab work and interrogation results, signed out to Dr. Nicholes Stairs.        Final Clinical Impression(s) / ED Diagnoses Final diagnoses:  Near syncope    Rx / DC Orders ED Discharge Orders     None         Lucrezia Starch, MD 09/19/21 [phone]

## 2021-09-19 NOTE — Discharge Instructions (Addendum)
Follow-up with your primary care doctor.  Come back to ER as needed. 

## 2021-09-22 DIAGNOSIS — Z950 Presence of cardiac pacemaker: Secondary | ICD-10-CM | POA: Diagnosis not present

## 2021-09-22 DIAGNOSIS — I083 Combined rheumatic disorders of mitral, aortic and tricuspid valves: Secondary | ICD-10-CM | POA: Diagnosis not present

## 2021-09-22 DIAGNOSIS — J449 Chronic obstructive pulmonary disease, unspecified: Secondary | ICD-10-CM | POA: Diagnosis not present

## 2021-09-22 DIAGNOSIS — Z9181 History of falling: Secondary | ICD-10-CM | POA: Diagnosis not present

## 2021-09-22 DIAGNOSIS — N1832 Chronic kidney disease, stage 3b: Secondary | ICD-10-CM | POA: Diagnosis not present

## 2021-09-22 DIAGNOSIS — I130 Hypertensive heart and chronic kidney disease with heart failure and stage 1 through stage 4 chronic kidney disease, or unspecified chronic kidney disease: Secondary | ICD-10-CM | POA: Diagnosis not present

## 2021-09-22 DIAGNOSIS — G9341 Metabolic encephalopathy: Secondary | ICD-10-CM | POA: Diagnosis not present

## 2021-09-22 DIAGNOSIS — I5032 Chronic diastolic (congestive) heart failure: Secondary | ICD-10-CM | POA: Diagnosis not present

## 2021-09-23 DIAGNOSIS — N1832 Chronic kidney disease, stage 3b: Secondary | ICD-10-CM | POA: Diagnosis not present

## 2021-09-23 DIAGNOSIS — I130 Hypertensive heart and chronic kidney disease with heart failure and stage 1 through stage 4 chronic kidney disease, or unspecified chronic kidney disease: Secondary | ICD-10-CM | POA: Diagnosis not present

## 2021-09-23 DIAGNOSIS — J449 Chronic obstructive pulmonary disease, unspecified: Secondary | ICD-10-CM | POA: Diagnosis not present

## 2021-09-23 DIAGNOSIS — Z9181 History of falling: Secondary | ICD-10-CM | POA: Diagnosis not present

## 2021-09-23 DIAGNOSIS — I5032 Chronic diastolic (congestive) heart failure: Secondary | ICD-10-CM | POA: Diagnosis not present

## 2021-09-23 DIAGNOSIS — I083 Combined rheumatic disorders of mitral, aortic and tricuspid valves: Secondary | ICD-10-CM | POA: Diagnosis not present

## 2021-09-23 DIAGNOSIS — Z950 Presence of cardiac pacemaker: Secondary | ICD-10-CM | POA: Diagnosis not present

## 2021-09-23 DIAGNOSIS — G9341 Metabolic encephalopathy: Secondary | ICD-10-CM | POA: Diagnosis not present

## 2021-09-25 DIAGNOSIS — I10 Essential (primary) hypertension: Secondary | ICD-10-CM | POA: Diagnosis not present

## 2021-09-25 DIAGNOSIS — E1165 Type 2 diabetes mellitus with hyperglycemia: Secondary | ICD-10-CM | POA: Diagnosis not present

## 2021-09-30 DIAGNOSIS — I5032 Chronic diastolic (congestive) heart failure: Secondary | ICD-10-CM | POA: Diagnosis not present

## 2021-09-30 DIAGNOSIS — G9341 Metabolic encephalopathy: Secondary | ICD-10-CM | POA: Diagnosis not present

## 2021-09-30 DIAGNOSIS — N1832 Chronic kidney disease, stage 3b: Secondary | ICD-10-CM | POA: Diagnosis not present

## 2021-09-30 DIAGNOSIS — I130 Hypertensive heart and chronic kidney disease with heart failure and stage 1 through stage 4 chronic kidney disease, or unspecified chronic kidney disease: Secondary | ICD-10-CM | POA: Diagnosis not present

## 2021-09-30 DIAGNOSIS — I083 Combined rheumatic disorders of mitral, aortic and tricuspid valves: Secondary | ICD-10-CM | POA: Diagnosis not present

## 2021-09-30 DIAGNOSIS — Z9181 History of falling: Secondary | ICD-10-CM | POA: Diagnosis not present

## 2021-09-30 DIAGNOSIS — J449 Chronic obstructive pulmonary disease, unspecified: Secondary | ICD-10-CM | POA: Diagnosis not present

## 2021-09-30 DIAGNOSIS — Z950 Presence of cardiac pacemaker: Secondary | ICD-10-CM | POA: Diagnosis not present

## 2021-10-02 DIAGNOSIS — I5032 Chronic diastolic (congestive) heart failure: Secondary | ICD-10-CM | POA: Diagnosis not present

## 2021-10-02 DIAGNOSIS — I130 Hypertensive heart and chronic kidney disease with heart failure and stage 1 through stage 4 chronic kidney disease, or unspecified chronic kidney disease: Secondary | ICD-10-CM | POA: Diagnosis not present

## 2021-10-02 DIAGNOSIS — N1832 Chronic kidney disease, stage 3b: Secondary | ICD-10-CM | POA: Diagnosis not present

## 2021-10-02 DIAGNOSIS — Z9181 History of falling: Secondary | ICD-10-CM | POA: Diagnosis not present

## 2021-10-02 DIAGNOSIS — I083 Combined rheumatic disorders of mitral, aortic and tricuspid valves: Secondary | ICD-10-CM | POA: Diagnosis not present

## 2021-10-02 DIAGNOSIS — J449 Chronic obstructive pulmonary disease, unspecified: Secondary | ICD-10-CM | POA: Diagnosis not present

## 2021-10-02 DIAGNOSIS — G9341 Metabolic encephalopathy: Secondary | ICD-10-CM | POA: Diagnosis not present

## 2021-10-02 DIAGNOSIS — Z950 Presence of cardiac pacemaker: Secondary | ICD-10-CM | POA: Diagnosis not present

## 2021-10-08 DIAGNOSIS — Z933 Colostomy status: Secondary | ICD-10-CM | POA: Diagnosis not present

## 2021-10-08 DIAGNOSIS — K631 Perforation of intestine (nontraumatic): Secondary | ICD-10-CM | POA: Diagnosis not present

## 2021-11-17 DIAGNOSIS — K631 Perforation of intestine (nontraumatic): Secondary | ICD-10-CM | POA: Diagnosis not present

## 2021-11-17 DIAGNOSIS — Z933 Colostomy status: Secondary | ICD-10-CM | POA: Diagnosis not present

## 2021-12-15 DIAGNOSIS — Z933 Colostomy status: Secondary | ICD-10-CM | POA: Diagnosis not present

## 2021-12-15 DIAGNOSIS — K631 Perforation of intestine (nontraumatic): Secondary | ICD-10-CM | POA: Diagnosis not present

## 2021-12-17 ENCOUNTER — Encounter (HOSPITAL_COMMUNITY): Payer: Self-pay | Admitting: Emergency Medicine

## 2021-12-17 ENCOUNTER — Emergency Department (HOSPITAL_COMMUNITY): Payer: Medicare Other

## 2021-12-17 ENCOUNTER — Emergency Department (HOSPITAL_COMMUNITY)
Admission: EM | Admit: 2021-12-17 | Discharge: 2021-12-18 | Disposition: A | Discharge status: Home / Self Care | Payer: Medicare Other | Attending: Emergency Medicine | Admitting: Emergency Medicine

## 2021-12-17 DIAGNOSIS — R600 Localized edema: Secondary | ICD-10-CM | POA: Insufficient documentation

## 2021-12-17 DIAGNOSIS — R531 Weakness: Secondary | ICD-10-CM | POA: Diagnosis not present

## 2021-12-17 DIAGNOSIS — R6889 Other general symptoms and signs: Secondary | ICD-10-CM | POA: Diagnosis not present

## 2021-12-17 DIAGNOSIS — R5383 Other fatigue: Secondary | ICD-10-CM | POA: Diagnosis not present

## 2021-12-17 DIAGNOSIS — Z743 Need for continuous supervision: Secondary | ICD-10-CM | POA: Diagnosis not present

## 2021-12-17 DIAGNOSIS — R42 Dizziness and giddiness: Secondary | ICD-10-CM | POA: Diagnosis not present

## 2021-12-17 DIAGNOSIS — R1111 Vomiting without nausea: Secondary | ICD-10-CM | POA: Diagnosis not present

## 2021-12-17 DIAGNOSIS — R404 Transient alteration of awareness: Secondary | ICD-10-CM | POA: Diagnosis not present

## 2021-12-17 NOTE — ED Notes (Signed)
Attempted to get labs. Unsuccessful

## 2021-12-17 NOTE — ED Triage Notes (Signed)
Patient BIB [facility] from home for evaluation of generalized weakness. VSS, patient is alert, oriented, and in no apparent distress at this time.

## 2021-12-17 NOTE — ED Provider Triage Note (Signed)
Emergency Medicine Provider Triage Evaluation Note  BERLIN VIERECK , a 88 y.o. female  was evaluated in triage.  Pt complains of fatigue and weakness.  Patient has difficult time describing this.  She states she feels unwell.  No fever, nausea, vomiting, chest pain, shortness of breath, dysuria or hematuria Review of Systems  Positive: Weakness Negative: Fever  Physical Exam  There were no vitals taken for this visit. Gen:   Awake, no distress   Resp:  Normal effort  MSK:   Moves extremities without difficulty  Other:  CN 2-12 grossly intact, equal hand grip, neg drift, intact sensation  Medical Decision Making  Medically screening exam initiated at 6:03 PM.  Appropriate orders placed.  Trinity B Prue was informed that the remainder of the evaluation will be completed by another provider, this initial triage assessment does not replace that evaluation, and the importance of remaining in the ED until their evaluation is complete.  weakness   Johnathen Testa A, PA-C 12/17/21 1804

## 2021-12-18 LAB — COMPREHENSIVE METABOLIC PANEL
ALT: 14 U/L (ref 0–44)
AST: 21 U/L (ref 15–41)
Albumin: 3.2 g/dL — ABNORMAL LOW (ref 3.5–5.0)
Alkaline Phosphatase: 60 U/L (ref 38–126)
Anion gap: 7 (ref 5–15)
BUN: 41 mg/dL — ABNORMAL HIGH (ref 8–23)
CO2: 27 mmol/L (ref 22–32)
Calcium: 9.6 mg/dL (ref 8.9–10.3)
Chloride: 108 mmol/L (ref 98–111)
Creatinine, Ser: 2.08 mg/dL — ABNORMAL HIGH (ref 0.44–1.00)
GFR, Estimated: 23 mL/min — ABNORMAL LOW (ref >60)
Glucose, Bld: 161 mg/dL — ABNORMAL HIGH (ref 70–99)
Potassium: 4.9 mmol/L (ref 3.5–5.1)
Sodium: 142 mmol/L (ref 135–145)
Total Bilirubin: 0.4 mg/dL (ref 0.3–1.2)
Total Protein: 6.7 g/dL (ref 6.5–8.1)

## 2021-12-18 LAB — CBC WITH DIFFERENTIAL/PLATELET
Abs Immature Granulocytes: 0.01 10*3/uL (ref 0.00–0.07)
Basophils Absolute: 0.1 10*3/uL (ref 0.0–0.1)
Basophils Relative: 1 %
Eosinophils Absolute: 0.2 10*3/uL (ref 0.0–0.5)
Eosinophils Relative: 2 %
HCT: 35.1 % — ABNORMAL LOW (ref 36.0–46.0)
Hemoglobin: 11.2 g/dL — ABNORMAL LOW (ref 12.0–15.0)
Immature Granulocytes: 0 %
Lymphocytes Relative: 15 %
Lymphs Abs: 1.1 10*3/uL (ref 0.7–4.0)
MCH: 30.9 pg (ref 26.0–34.0)
MCHC: 31.9 g/dL (ref 30.0–36.0)
MCV: 97 fL (ref 80.0–100.0)
Monocytes Absolute: 0.4 10*3/uL (ref 0.1–1.0)
Monocytes Relative: 5 %
Neutro Abs: 5.5 10*3/uL (ref 1.7–7.7)
Neutrophils Relative %: 77 %
Platelets: 182 10*3/uL (ref 150–400)
RBC: 3.62 MIL/uL — ABNORMAL LOW (ref 3.87–5.11)
RDW: 13.2 % (ref 11.5–15.5)
WBC: 7.2 10*3/uL (ref 4.0–10.5)
nRBC: 0 % (ref 0.0–0.2)

## 2021-12-18 NOTE — ED Provider Notes (Signed)
[name]   CSN: [id number] Arrival date & time: 12/17/21  1759     History  Chief Complaint  Patient presents with   Weakness    [name] is a 88 y.o. female.  88 yo F with a chief complaints of fatigue.  She tells me that this has been going on for couple days.  She does not appreciate any other symptoms.  Feels like she is been eating and drinking normally.  Denies cough congestion or fever denies chest pain denies shortness of breath denies abdominal pain denies nausea vomiting or diarrhea.  She has some chronic leg swelling that she does not think is changed.  No new medicines no new supplements.   Weakness      Home Medications Prior to Admission medications   Medication Sig Start Date End Date Taking? Authorizing Provider  acetaminophen (TYLENOL) 500 MG tablet Take 500 mg by mouth every 6 (six) hours as needed for moderate pain.    [provider]  albuterol (VENTOLIN HFA) 108 (90 Base) MCG/ACT inhaler Inhale 2 puffs into the lungs every 4 (four) hours as needed for wheezing or shortness of breath. 04/29/20   Medina-Vargas, Monina C, NP  alendronate (FOSAMAX) 70 MG tablet Take 70 mg by mouth once a week. Sundays 05/30/21   [provider]  busPIRone (BUSPAR) 5 MG tablet Take 1 tablet (5 mg total) by mouth 3 (three) times daily. 04/29/20   Medina-Vargas, Monina C, NP  donepezil (ARICEPT) 10 MG tablet Take 1 tablet (10 mg total) by mouth at bedtime. 09/05/20   Nita Sells, MD  loratadine (CLARITIN) 10 MG tablet Take 10 mg by mouth daily as needed for allergies.    [provider]  memantine (NAMENDA XR) 28 MG CP24 24 hr capsule Take 1 capsule (28 mg total) by mouth every morning. 09/05/20   Nita Sells, MD  ondansetron (ZOFRAN ODT) 4 MG disintegrating tablet 4mg  ODT q4 hours prn nausea/vomit Patient taking differently: Take 4 mg by mouth every 8 (eight) hours as needed for  nausea or vomiting. 12/10/20   Mesner, Corene Cornea, MD  pravastatin (PRAVACHOL) 10 MG tablet Take 10 mg by mouth at bedtime. 08/04/21   [provider]      Allergies    Aspirin    Review of Systems   Review of Systems  Neurological:  Positive for weakness.    Physical Exam Updated Vital Signs BP 131/68   Pulse (!) 59   Temp 98 F (36.7 C)   Resp 18   SpO2 97%  Physical Exam Vitals and nursing Anderson reviewed.  Constitutional:      General: She is not in acute distress.    Appearance: She is well-developed. She is not diaphoretic.  HENT:     Head: Normocephalic and atraumatic.  Eyes:     Pupils: Pupils are equal, round, and reactive to light.  Cardiovascular:     Rate and Rhythm: Normal rate and regular rhythm.     Heart sounds: No murmur heard.    No friction rub. No gallop.  Pulmonary:     Effort: Pulmonary effort is normal.     Breath sounds: No wheezing or rales.  Abdominal:     General: There is no distension.     Palpations: Abdomen is soft.     Tenderness: There is no abdominal tenderness.     Comments: Ostomy bag without any obvious surrounding tenderness or erythema or drainage  Musculoskeletal:        General: No tenderness.     Cervical back: Normal range of motion and neck supple.     Right lower leg: Edema present.     Left lower leg: Edema present.     Comments: Bilateral lower extremity edema with chronic skin changes  Skin:    General: Skin is warm and dry.  Neurological:     Mental Status: She is alert and oriented to person, place, and time.  Psychiatric:        Behavior: Behavior normal.     ED Results / Procedures / Treatments   Labs (all labs ordered are listed, but only abnormal results are displayed) Labs Reviewed  CBC WITH DIFFERENTIAL/PLATELET - Abnormal; Notable for the following components:      Result Value   RBC 3.62 (*)    Hemoglobin 11.2 (*)    HCT 35.1 (*)    All other components within normal limits  COMPREHENSIVE  METABOLIC PANEL - Abnormal; Notable for the following components:   Glucose, Bld 161 (*)    BUN 41 (*)    Creatinine, Ser 2.08 (*)    Albumin 3.2 (*)    GFR, Estimated 23 (*)    All other components within normal limits  URINALYSIS, ROUTINE W REFLEX MICROSCOPIC    EKG None  Radiology DG Chest 2 View  Result Date: 12/17/2021 CLINICAL DATA:  Weakness EXAM: CHEST - 2 VIEW COMPARISON:  Chest x-ray 09/18/2021.  Chest CT 06/05/2004. FINDINGS: Left-sided pacemaker is again seen. The heart is mildly enlarged. Mediastinal silhouette is within normal limits. Both lungs are clear. No pleural effusion or pneumothorax. Degenerative changes affect the spine and shoulders. Lumbar fusion hardware is partially visualized. IMPRESSION: 1. No active cardiopulmonary disease. 2. Mild cardiomegaly. Electronically Signed   By: Ronney Asters M.D.   On: 12/17/2021 18:54    Procedures Procedures    Medications Ordered in ED Medications - No data to display  ED Course/ Medical Decision Making/ A&P                           Medical Decision Making  88 yo F with a chief complaints of feeling fatigued.  This has been going on for a couple days.  She denies any other specific symptoms.  She has a very mild bump in her creatinine compared to baseline, BUN is also mildly elevated.  Her hemoglobin is also slightly less than baseline but within the standard her of the lab test.  Chest x-ray independently interpreted by me without focal infiltrate or pneumothorax.  The patient does think that she is mildly better now than when she got here.  I discussed following up with her family doctor in the office which she agrees.  We will have her eat and drink as well as shecan for the next 48 hours.  11:06 AM:  I have discussed the diagnosis/risks/treatment options with the patient.  Evaluation and diagnostic testing in the emergency department does not suggest an emergent condition requiring admission or immediate  intervention beyond what has been performed at this time.  They will follow up with PCP. We also discussed returning to the ED immediately if new or worsening sx occur. We discussed the sx which are most concerning (e.g., sudden worsening pain, fever, inability to tolerate by mouth) that necessitate immediate return. Medications administered to the patient during their visit and any new prescriptions provided to the patient are  listed below.  Medications given during this visit Medications - No data to display   The patient appears reasonably screen and/or stabilized for discharge and I doubt any other medical condition or other [facility] requiring further screening, evaluation, or treatment in the ED at this time prior to discharge.          Final Clinical Impression(s) / ED Diagnoses Final diagnoses:  Weakness    Rx / DC Orders ED Discharge Orders     None         Deno Etienne, DO 12/18/21 1106

## 2021-12-18 NOTE — Discharge Instructions (Signed)
Eat and drink as well as you can for the next 48 hours.  Please call your family doctor and let them know that you are seen here.  See when they want to see you in the office.  Please return to emergency [place] [facility] for worsening symptoms or if you develop chest pain difficulty breathing abdominal pain one-sided numbness or weakness or difficulty speech or swallowing.

## 2021-12-18 NOTE — ED Notes (Signed)
Patient states she is feeling pretty good.

## 2022-01-12 DIAGNOSIS — Z933 Colostomy status: Secondary | ICD-10-CM | POA: Diagnosis not present

## 2022-01-12 DIAGNOSIS — K631 Perforation of intestine (nontraumatic): Secondary | ICD-10-CM | POA: Diagnosis not present

## 2022-02-23 DIAGNOSIS — K631 Perforation of intestine (nontraumatic): Secondary | ICD-10-CM | POA: Diagnosis not present

## 2022-02-23 DIAGNOSIS — Z933 Colostomy status: Secondary | ICD-10-CM | POA: Diagnosis not present

## 2022-02-25 ENCOUNTER — Emergency Department (HOSPITAL_COMMUNITY): Payer: Medicare Other

## 2022-02-25 ENCOUNTER — Inpatient Hospital Stay (HOSPITAL_COMMUNITY)
Admission: EM | Admit: 2022-02-25 | Discharge: 2022-02-28 | DRG: 312 | Disposition: A | Discharge status: Home / Self Care | Payer: Medicare Other | Attending: Family Medicine | Admitting: Family Medicine

## 2022-02-25 ENCOUNTER — Ambulatory Visit (INDEPENDENT_AMBULATORY_CARE_PROVIDER_SITE_OTHER): Payer: Medicare Other

## 2022-02-25 ENCOUNTER — Encounter (HOSPITAL_COMMUNITY): Payer: Self-pay | Admitting: Emergency Medicine

## 2022-02-25 ENCOUNTER — Other Ambulatory Visit: Payer: Self-pay

## 2022-02-25 DIAGNOSIS — I10 Essential (primary) hypertension: Secondary | ICD-10-CM | POA: Diagnosis not present

## 2022-02-25 DIAGNOSIS — R296 Repeated falls: Secondary | ICD-10-CM | POA: Diagnosis present

## 2022-02-25 DIAGNOSIS — Z886 Allergy status to analgesic agent status: Secondary | ICD-10-CM

## 2022-02-25 DIAGNOSIS — R6889 Other general symptoms and signs: Secondary | ICD-10-CM | POA: Diagnosis not present

## 2022-02-25 DIAGNOSIS — I472 Ventricular tachycardia, unspecified: Secondary | ICD-10-CM | POA: Diagnosis not present

## 2022-02-25 DIAGNOSIS — Z9181 History of falling: Secondary | ICD-10-CM | POA: Diagnosis not present

## 2022-02-25 DIAGNOSIS — I77819 Aortic ectasia, unspecified site: Secondary | ICD-10-CM | POA: Diagnosis not present

## 2022-02-25 DIAGNOSIS — N1832 Chronic kidney disease, stage 3b: Secondary | ICD-10-CM | POA: Diagnosis not present

## 2022-02-25 DIAGNOSIS — E1122 Type 2 diabetes mellitus with diabetic chronic kidney disease: Secondary | ICD-10-CM | POA: Diagnosis not present

## 2022-02-25 DIAGNOSIS — I5032 Chronic diastolic (congestive) heart failure: Secondary | ICD-10-CM

## 2022-02-25 DIAGNOSIS — Z833 Family history of diabetes mellitus: Secondary | ICD-10-CM

## 2022-02-25 DIAGNOSIS — I951 Orthostatic hypotension: Principal | ICD-10-CM | POA: Diagnosis present

## 2022-02-25 DIAGNOSIS — Z841 Family history of disorders of kidney and ureter: Secondary | ICD-10-CM

## 2022-02-25 DIAGNOSIS — R55 Syncope and collapse: Secondary | ICD-10-CM | POA: Diagnosis not present

## 2022-02-25 DIAGNOSIS — Z981 Arthrodesis status: Secondary | ICD-10-CM

## 2022-02-25 DIAGNOSIS — Z743 Need for continuous supervision: Secondary | ICD-10-CM | POA: Diagnosis not present

## 2022-02-25 DIAGNOSIS — E869 Volume depletion, unspecified: Secondary | ICD-10-CM | POA: Diagnosis not present

## 2022-02-25 DIAGNOSIS — E118 Type 2 diabetes mellitus with unspecified complications: Secondary | ICD-10-CM | POA: Diagnosis not present

## 2022-02-25 DIAGNOSIS — R0902 Hypoxemia: Secondary | ICD-10-CM | POA: Diagnosis not present

## 2022-02-25 DIAGNOSIS — Z79899 Other long term (current) drug therapy: Secondary | ICD-10-CM

## 2022-02-25 DIAGNOSIS — Z87891 Personal history of nicotine dependence: Secondary | ICD-10-CM

## 2022-02-25 DIAGNOSIS — Z8701 Personal history of pneumonia (recurrent): Secondary | ICD-10-CM

## 2022-02-25 DIAGNOSIS — I130 Hypertensive heart and chronic kidney disease with heart failure and stage 1 through stage 4 chronic kidney disease, or unspecified chronic kidney disease: Secondary | ICD-10-CM | POA: Diagnosis not present

## 2022-02-25 DIAGNOSIS — J4489 Other specified chronic obstructive pulmonary disease: Secondary | ICD-10-CM | POA: Diagnosis not present

## 2022-02-25 DIAGNOSIS — Z933 Colostomy status: Secondary | ICD-10-CM

## 2022-02-25 DIAGNOSIS — E1140 Type 2 diabetes mellitus with diabetic neuropathy, unspecified: Secondary | ICD-10-CM | POA: Diagnosis not present

## 2022-02-25 DIAGNOSIS — F0390 Unspecified dementia without behavioral disturbance: Secondary | ICD-10-CM | POA: Diagnosis present

## 2022-02-25 DIAGNOSIS — N179 Acute kidney failure, unspecified: Secondary | ICD-10-CM | POA: Diagnosis not present

## 2022-02-25 DIAGNOSIS — I6782 Cerebral ischemia: Secondary | ICD-10-CM | POA: Diagnosis not present

## 2022-02-25 DIAGNOSIS — E11649 Type 2 diabetes mellitus with hypoglycemia without coma: Secondary | ICD-10-CM | POA: Diagnosis not present

## 2022-02-25 DIAGNOSIS — Z7983 Long term (current) use of bisphosphonates: Secondary | ICD-10-CM

## 2022-02-25 DIAGNOSIS — R001 Bradycardia, unspecified: Secondary | ICD-10-CM | POA: Diagnosis not present

## 2022-02-25 DIAGNOSIS — Z950 Presence of cardiac pacemaker: Secondary | ICD-10-CM

## 2022-02-25 DIAGNOSIS — Z1152 Encounter for screening for COVID-19: Secondary | ICD-10-CM

## 2022-02-25 DIAGNOSIS — E119 Type 2 diabetes mellitus without complications: Secondary | ICD-10-CM | POA: Diagnosis present

## 2022-02-25 DIAGNOSIS — E860 Dehydration: Secondary | ICD-10-CM | POA: Diagnosis not present

## 2022-02-25 DIAGNOSIS — Z90710 Acquired absence of both cervix and uterus: Secondary | ICD-10-CM

## 2022-02-25 DIAGNOSIS — Z8249 Family history of ischemic heart disease and other diseases of the circulatory system: Secondary | ICD-10-CM

## 2022-02-25 LAB — CBC WITH DIFFERENTIAL/PLATELET
Abs Immature Granulocytes: 0.02 10*3/uL (ref 0.00–0.07)
Basophils Absolute: 0 10*3/uL (ref 0.0–0.1)
Basophils Relative: 1 %
Eosinophils Absolute: 0 10*3/uL (ref 0.0–0.5)
Eosinophils Relative: 0 %
HCT: 35.2 % — ABNORMAL LOW (ref 36.0–46.0)
Hemoglobin: 11.5 g/dL — ABNORMAL LOW (ref 12.0–15.0)
Immature Granulocytes: 0 %
Lymphocytes Relative: 9 %
Lymphs Abs: 0.5 10*3/uL — ABNORMAL LOW (ref 0.7–4.0)
MCH: 30.8 pg (ref 26.0–34.0)
MCHC: 32.7 g/dL (ref 30.0–36.0)
MCV: 94.4 fL (ref 80.0–100.0)
Monocytes Absolute: 0.3 10*3/uL (ref 0.1–1.0)
Monocytes Relative: 5 %
Neutro Abs: 5.2 10*3/uL (ref 1.7–7.7)
Neutrophils Relative %: 85 %
Platelets: 137 10*3/uL — ABNORMAL LOW (ref 150–400)
RBC: 3.73 MIL/uL — ABNORMAL LOW (ref 3.87–5.11)
RDW: 12.8 % (ref 11.5–15.5)
WBC: 6 10*3/uL (ref 4.0–10.5)
nRBC: 0 % (ref 0.0–0.2)

## 2022-02-25 LAB — COMPREHENSIVE METABOLIC PANEL
ALT: 10 U/L (ref 0–44)
AST: 15 U/L (ref 15–41)
Albumin: 3.2 g/dL — ABNORMAL LOW (ref 3.5–5.0)
Alkaline Phosphatase: 58 U/L (ref 38–126)
Anion gap: 8 (ref 5–15)
BUN: 34 mg/dL — ABNORMAL HIGH (ref 8–23)
CO2: 25 mmol/L (ref 22–32)
Calcium: 9.3 mg/dL (ref 8.9–10.3)
Chloride: 107 mmol/L (ref 98–111)
Creatinine, Ser: 1.83 mg/dL — ABNORMAL HIGH (ref 0.44–1.00)
GFR, Estimated: 27 mL/min — ABNORMAL LOW (ref >60)
Glucose, Bld: 131 mg/dL — ABNORMAL HIGH (ref 70–99)
Potassium: 4.7 mmol/L (ref 3.5–5.1)
Sodium: 140 mmol/L (ref 135–145)
Total Bilirubin: 0.6 mg/dL (ref 0.3–1.2)
Total Protein: 6.5 g/dL (ref 6.5–8.1)

## 2022-02-25 LAB — PROTIME-INR
INR: 1 (ref 0.8–1.2)
Prothrombin Time: 13.1 seconds (ref 11.4–15.2)

## 2022-02-25 LAB — BRAIN NATRIURETIC PEPTIDE: B Natriuretic Peptide: 83.6 pg/mL (ref 0.0–100.0)

## 2022-02-25 LAB — TROPONIN I (HIGH SENSITIVITY)
Troponin I (High Sensitivity): 6 ng/L (ref <18)
Troponin I (High Sensitivity): 5 ng/L (ref <18)

## 2022-02-25 LAB — CBG MONITORING, ED: Glucose-Capillary: 137 mg/dL — ABNORMAL HIGH (ref 70–99)

## 2022-02-25 LAB — MAGNESIUM: Magnesium: 1.6 mg/dL — ABNORMAL LOW (ref 1.7–2.4)

## 2022-02-25 MED ORDER — ERYTHROMYCIN 5 MG/GM OP OINT
1.0000 | TOPICAL_OINTMENT | Freq: Four times a day (QID) | OPHTHALMIC | Status: DC
  Administered 2022-02-25: 1 via OPHTHALMIC
  Filled 2022-02-25: qty 3.5

## 2022-02-25 MED ORDER — LACTATED RINGERS IV BOLUS
500.0000 mL | Freq: Once | INTRAVENOUS | Status: AC
  Administered 2022-02-25: 500 mL via INTRAVENOUS

## 2022-02-25 MED ORDER — MAGNESIUM SULFATE 2 GM/50ML IV SOLN
2.0000 g | Freq: Once | INTRAVENOUS | Status: AC
  Administered 2022-02-25: 2 g via INTRAVENOUS
  Filled 2022-02-25: qty 50

## 2022-02-25 NOTE — Progress Notes (Signed)
Unit RN at bedside and has sufficient PIV access. RN to place consult if additional PIV needed

## 2022-02-25 NOTE — ED Notes (Signed)
Pacemaker interrogated. 

## 2022-02-25 NOTE — ED Triage Notes (Signed)
Pt arrived via [facility] for syncope from home. Pt has dementia, patient was found sitting at the kitchen table with head on table with difficulty waking pt up. Pt was hypotensive 70/p, faint pulse, improved during transport, more alert at time of arrival to ED - able to hold conversation, provide name (A&Ox3). EMS administered 100cc NS via 22g LW. PMH Diabeties, dementia, colostomy, pacemaker, HTN. PT denies nausea, shortness of breath, chest pain, dizziness, head ache, vomiting.   PTA Vitals  BP 99/58 HR 60 NS SPO2 98% RA RR 16 CBG 132

## 2022-02-25 NOTE — ED Notes (Signed)
RN received call from [facility], no abnormalities noted, device functioning normally

## 2022-02-25 NOTE — ED Notes (Signed)
Please call this number for an update she is the poa and grand daug.  [place] [phone]

## 2022-02-25 NOTE — ED Provider Notes (Signed)
[facility] EMERGENCY DEPARTMENT Provider Note   CSN: [id number] Arrival date & time: 02/25/22  1804     History  Chief Complaint  Patient presents with   Loss of Consciousness    [name] is a 88 y.o. female.   Loss of Consciousness Patient presents for syncopal episode.  Medical history includes DM, dementia, HTN, CKD, pacemaker placement, CHF, asthma, neuropathy.  She arrives via EMS from home.  Family member found her slumped at the dining room table.  EMS was called to the scene.  On arrival, patient was minimally responsive.  Initial blood pressures were in the range of 70s SBP.  She had improved blood pressure and improved mentation during transit.  100 cc of IVF were given.  Blood pressure shortly prior to arrival was 99/58.  CBG was normal.  Patient, herself, states that she feels "not good".  She is unable to specify any current symptoms.  She denies chest pain, abdominal pain, nausea, shortness of breath, headache.     Home Medications Prior to Admission medications   Medication Sig Start Date End Date Taking? Authorizing Provider  acetaminophen (TYLENOL) 500 MG tablet Take 500 mg by mouth every 6 (six) hours as needed for moderate pain.    [provider]  albuterol (VENTOLIN HFA) 108 (90 Base) MCG/ACT inhaler Inhale 2 puffs into the lungs every 4 (four) hours as needed for wheezing or shortness of breath. 04/29/20   Medina-Vargas, Monina C, NP  alendronate (FOSAMAX) 70 MG tablet Take 70 mg by mouth once a week. Sundays 05/30/21   [provider]  busPIRone (BUSPAR) 5 MG tablet Take 1 tablet (5 mg total) by mouth 3 (three) times daily. 04/29/20   Medina-Vargas, Monina C, NP  donepezil (ARICEPT) 10 MG tablet Take 1 tablet (10 mg total) by mouth at bedtime. 09/05/20   Nita Sells, MD  loratadine (CLARITIN) 10 MG tablet Take 10 mg by mouth daily as needed for allergies.    [provider]  memantine (NAMENDA XR) 28 MG  CP24 24 hr capsule Take 1 capsule (28 mg total) by mouth every morning. 09/05/20   Nita Sells, MD  ondansetron (ZOFRAN ODT) 4 MG disintegrating tablet 4mg  ODT q4 hours prn nausea/vomit Patient taking differently: Take 4 mg by mouth every 8 (eight) hours as needed for nausea or vomiting. 12/10/20   Mesner, Corene Cornea, MD  pravastatin (PRAVACHOL) 10 MG tablet Take 10 mg by mouth at bedtime. 08/04/21   [provider]      Allergies    Aspirin    Review of Systems   Review of Systems  Unable to perform ROS: Dementia  Cardiovascular:  Positive for syncope.  Neurological:  Positive for syncope.    Physical Exam Updated Vital Signs BP 134/76   Pulse 67   Temp (!) 97.5 F (36.4 C) (Oral)   Resp 15   Ht 5\' 4"  (1.626 m)   Wt 54 kg   SpO2 100%   BMI 20.43 kg/m  Physical Exam Vitals and nursing note reviewed.  Constitutional:      General: She is not in acute distress.    Appearance: She is well-developed. She is cachectic. She is ill-appearing (Chronically). She is not toxic-appearing or diaphoretic.  HENT:     Head: Normocephalic and atraumatic.     Right Ear: External ear normal.     Left Ear: External ear normal.     Nose: Nose normal.     Mouth/Throat:  Mouth: Mucous membranes are moist.  Eyes:     General:        Right eye: Discharge present.        Left eye: Discharge present.    Extraocular Movements: Extraocular movements intact.     Conjunctiva/sclera: Conjunctivae normal.  Cardiovascular:     Rate and Rhythm: Normal rate and regular rhythm.     Heart sounds: No murmur heard. Pulmonary:     Effort: Pulmonary effort is normal. No respiratory distress.     Breath sounds: Normal breath sounds. No wheezing or rales.  Chest:     Chest wall: No tenderness.  Abdominal:     General: There is no distension.     Palpations: Abdomen is soft.     Tenderness: There is no abdominal tenderness.     Comments: Colostomy bag in place  Musculoskeletal:         General: No swelling. Normal range of motion.     Cervical back: Normal range of motion and neck supple. No rigidity or tenderness.     Right lower leg: No edema.     Left lower leg: No edema.  Skin:    General: Skin is warm and dry.     Coloration: Skin is not jaundiced or pale.  Neurological:     General: No focal deficit present.     Mental Status: She is alert. Mental status is at baseline.     Cranial Nerves: No cranial nerve deficit.     Sensory: No sensory deficit.     Motor: No weakness.     Coordination: Coordination normal.  Psychiatric:        Mood and Affect: Mood normal.        Behavior: Behavior normal.     ED Results / Procedures / Treatments   Labs (all labs ordered are listed, but only abnormal results are displayed) Labs Reviewed  CBC WITH DIFFERENTIAL/PLATELET - Abnormal; Notable for the following components:      Result Value   RBC 3.73 (*)    Hemoglobin 11.5 (*)    HCT 35.2 (*)    Platelets 137 (*)    Lymphs Abs 0.5 (*)    All other components within normal limits  COMPREHENSIVE METABOLIC PANEL - Abnormal; Notable for the following components:   Glucose, Bld 131 (*)    BUN 34 (*)    Creatinine, Ser 1.83 (*)    Albumin 3.2 (*)    GFR, Estimated 27 (*)    All other components within normal limits  MAGNESIUM - Abnormal; Notable for the following components:   Magnesium 1.6 (*)    All other components within normal limits  CBG MONITORING, ED - Abnormal; Notable for the following components:   Glucose-Capillary 137 (*)    All other components within normal limits  RESP PANEL BY RT-PCR (RSV, FLU A&B, COVID)  RVPGX2  BRAIN NATRIURETIC PEPTIDE  PROTIME-INR  URINALYSIS, ROUTINE W REFLEX MICROSCOPIC  TROPONIN I (HIGH SENSITIVITY)  TROPONIN I (HIGH SENSITIVITY)    EKG EKG Interpretation  Date/Time:  Wednesday February 25 2022 18:16:34 EST Ventricular Rate:  60 PR Interval:  61 QRS Duration: 130 QT Interval:  462 QTC Calculation: 462 R  Axis:   -46 Text Interpretation: Atrial-paced rhythm Probable left atrial enlargement Nonspecific IVCD with LAD Left ventricular hypertrophy Confirmed by Godfrey Pick [phone]) on 02/25/2022 6:27:56 PM  Radiology DG Chest Port 1 View  Result Date: 02/25/2022 CLINICAL DATA:  Syncope EXAM: PORTABLE CHEST 1 VIEW  COMPARISON:  Previous studies including the examination of 12/17/2021 FINDINGS: Cardiac size is within normal limits. Low position of diaphragms may suggest COPD. Thoracic aorta is tortuous and ectatic. Pacemaker battery is seen in the left infraclavicular region with tips of leads in right atrium and right ventricle. Lung fields are clear of any infiltrates or pulmonary edema. There is no pleural effusion or pneumothorax. Degenerative changes are noted in both shoulders. IMPRESSION: There are no signs of pulmonary edema or focal pulmonary consolidation. Electronically Signed   By: Elmer Picker M.D.   On: 02/25/2022 19:53   CT HEAD WO CONTRAST  Result Date: 02/25/2022 CLINICAL DATA:  Syncope/presyncope, cerebrovascular cause suspected. EXAM: CT HEAD WITHOUT CONTRAST TECHNIQUE: Contiguous axial images were obtained from the base of the skull through the vertex without intravenous contrast. RADIATION DOSE REDUCTION: This exam was performed according to the departmental dose-optimization program which includes automated exposure control, adjustment of the mA and/or kV according to patient size and/or use of iterative reconstruction technique. COMPARISON:  Head CT 08/25/2021 and MRI 11/22/2017 FINDINGS: Brain: There is no evidence of an acute infarct, intracranial hemorrhage, mass, midline shift, or extra-axial fluid collection. 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 Cerebral white matter hypodensities are unchanged and nonspecific but compatible with mild chronic small vessel ischemic disease. There is mild cerebral atrophy. A partially empty sella and mild cerebellar tonsillar ectopia are  again noted. Vascular: Calcified atherosclerosis at the skull base. No hyperdense vessel. Skull: No acute fracture or suspicious osseous lesion. Sinuses/Orbits: New moderate volume fluid in the included portion of the left maxillary sinus. Unchanged mild left ethmoid air cell opacification. No significant mastoid fluid. Unremarkable orbits. Other: None. IMPRESSION: 1. No evidence of acute intracranial abnormality. 2. Mild chronic small vessel ischemic disease. Unchanged chronic right cerebellar infarct. 3. New left maxillary sinus fluid. Correlate for acute sinusitis. Electronically Signed   By: Logan Bores M.D.   On: 02/25/2022 19:14    Procedures Procedures    Medications Ordered in ED Medications  magnesium sulfate IVPB 2 g 50 mL (2 g Intravenous New Bag/Given 02/25/22 2211)  erythromycin ophthalmic ointment 1 Application (1 Application Both Eyes Given 02/25/22 2245)  lactated ringers bolus 500 mL (0 mLs Intravenous Stopped 02/25/22 2022)    ED Course/ Medical Decision Making/ A&P                           Medical Decision Making Amount and/or Complexity of Data Reviewed Labs: ordered. Radiology: ordered. ECG/medicine tests: ordered.  Risk Prescription drug management. Decision regarding hospitalization.   This patient presents to the ED for concern of loss of consciousness, this involves an extensive number of treatment options, and is a complaint that carries with it a high risk of complications and morbidity.  The differential diagnosis includes syncope, seizure, TIA, polypharmacy, arrhythmia, severe episode, dehydration, anemia   Co morbidities that complicate the patient evaluation  DM, dementia, HTN, CKD, pacemaker placement, CHF, asthma, neuropathy   Additional history obtained:  Additional history obtained from EMS External records from outside source obtained and reviewed including EMR   Lab Tests:  I Ordered, and personally interpreted labs.  The pertinent  results include: Baseline anemia, baseline CKD, normal blood glucose, hypomagnesemia with otherwise normal electrolytes   Imaging Studies ordered:  I ordered imaging studies including chest x-ray, CT head I independently visualized and interpreted imaging which showed no acute findings I agree with the radiologist interpretation   Cardiac Monitoring: /  EKG:  The patient was maintained on a cardiac monitor.  I personally viewed and interpreted the cardiac monitored which showed an underlying rhythm of: Paced rhythm  Problem List / ED Course / Critical interventions / Medication management  Patient presents after reported syncopal episode at home.  She was reportedly found slumped over dining room table by her granddaughter, who she lives with.  Granddaughter called EMS.  EMS reports lethargy on arrival.  SBP was in the range of 70s.  Initial twelve-lead EKG showed paced rhythm with narrow QRS.  SBP improved during transit.  On arrival, patient is normotensive.  She states that she feels unwell but is unable to describe any current symptoms.  Additional IV fluids were ordered.  EKG showed atrial paced rhythm.  Pacemaker was interrogated and report was that it is functioning normally with no recent events.  Lab work is notable for hypomagnesemia.  Replaced magnesium was ordered.  On reassessment, patient states that she feels improved.  Multiple attempts to call her granddaughter were unsuccessful.  Telephone calls went unanswered.  Story from EMS is concerning for high risk syncope.  Patient to be admitted for observation. I ordered medication including IV fluids for hydration; magnesium sulfate for hypomagnesemia Reevaluation of the patient after these medicines showed that the patient improved I have reviewed the patients home medicines and have made adjustments as needed   Social Determinants of Health:  Lives at home with granddaughter         Final Clinical Impression(s) / ED  Diagnoses Final diagnoses:  Syncope, unspecified syncope type    Rx / DC Orders ED Discharge Orders     None         Godfrey Pick, MD 02/25/22 2341

## 2022-02-25 NOTE — H&P (Signed)
History and Physical    Patient: [name] GHW:[id number] DOB: 1933/03/29 DOA: 02/25/2022 DOS: the patient was seen and examined on 02/26/2022 PCP: Leeroy Cha, MD  Patient coming from: Home  Chief Complaint:  Chief Complaint  Patient presents with   Loss of Consciousness   HPI: [name] is a 88 y.o. female with medical history significant of Dementia, sinus bradycardia s/p pacemaker, hypertension, chronic diastolic heart failure, asthma/COPD, type 2 diabetes, CKD 3B, s/p colostomy who presents with syncopal episode.  Pt unable to provide history. She is alert and oriented to self, place but not time. Not able to reach grand-daughter by phone. Reportedly per ED family found her sitting at kitchen table with head on table and difficult to wake up.  Patient was reportedly found by EMS with SBP of 70s and was given normal saline and became normotensive and was more alert on arrival to the ED.  Patient had a similar episode of transient altered mental status/delayed responsiveness back in 08/2021.  She had extensive workup with multiple interrogation of her pacemaker which was without overt findings.  Neurology was also consulted for possible seizure with continuous EEG which was negative despite tremors noted on video.  Echocardiogram showed grade 1 diastolic dysfunction but otherwise unremarkable.  Initially had positive orthostatics but ultimately was negative with standing.  She was discharged with further outpatient workup with PCP.  In the ED, her pacemaker was interrogated without any abnormal findings.  No significant electrolyte abnormalities compared with her baseline.  Troponin of 5.  BNP of 83.  CT head was negative with possible left maxillary sinusitis.   Review of Systems: As mentioned in the history of present illness. All other systems reviewed and are negative. Past Medical History:  Diagnosis Date   Asthma    CKD (chronic kidney disease) stage 3, GFR  30-59 ml/min (HCC) 05/01/2018   Colostomy present ([place])    hx/notes 07/17/2010   Dementia ([place])    Diabetic neuropathy ([place])    Hypertension    hx/notes 07/17/2010   Pneumonia    recent/notes 10/08/2016   Symptomatic bradycardia 12/15/2017   Type II diabetes mellitus ([place])    hx/notes 07/17/2010   Past Surgical History:  Procedure Laterality Date   ABDOMINAL HYSTERECTOMY     COLOSTOMY     S/P SBO hx/notes 07/17/2010   LUMBAR FUSION  11/2006   hx/notes 07/17/2010   NASAL SINUS SURGERY  04/28/2002   Bilateral inferior turbinate reductions, bilateral maxillary antrotomies, bilateral total ethmoidectomies, bilateral frontal recess explorations, bilateral sphenoidotomies, Instatrac guidance./notes 07/28/2010   PACEMAKER IMPLANT N/A 05/02/2018   Procedure: PACEMAKER IMPLANT;  Surgeon: Evans Lance, MD;  Location: [facility];  Service: Cardiovascular;  Laterality: N/A;   Social History:  reports that she has never smoked. She quit smokeless tobacco use about 5 years ago.  Her smokeless tobacco use included chew. She reports that she does not drink alcohol and does not use drugs.  Allergies  Allergen Reactions   Aspirin Other (See Comments)    Triggers asthma    Family History  Problem Relation Age of Onset   Kidney disease Mother    Hypertension Mother    Other Father        gangrene with amputation   Diabetes Sister    Cancer Brother    Heart disease Brother    Schizophrenia Daughter    Diabetes Daughter    Stroke Daughter    Diabetes Sister    Breast  cancer Neg Hx     Prior to Admission medications   Medication Sig Start Date End Date Taking? Authorizing Provider  acetaminophen (TYLENOL) 500 MG tablet Take 500 mg by mouth every 6 (six) hours as needed for moderate pain.    [provider]  albuterol (VENTOLIN HFA) 108 (90 Base) MCG/ACT inhaler Inhale 2 puffs into the lungs every 4 (four) hours as needed for wheezing or shortness of breath. 04/29/20   Medina-Vargas,  Monina C, NP  alendronate (FOSAMAX) 70 MG tablet Take 70 mg by mouth once a week. Sundays 05/30/21   [provider]  busPIRone (BUSPAR) 5 MG tablet Take 1 tablet (5 mg total) by mouth 3 (three) times daily. 04/29/20   Medina-Vargas, Monina C, NP  donepezil (ARICEPT) 10 MG tablet Take 1 tablet (10 mg total) by mouth at bedtime. 09/05/20   Nita Sells, MD  loratadine (CLARITIN) 10 MG tablet Take 10 mg by mouth daily as needed for allergies.    [provider]  memantine (NAMENDA XR) 28 MG CP24 24 hr capsule Take 1 capsule (28 mg total) by mouth every morning. 09/05/20   Nita Sells, MD  ondansetron (ZOFRAN ODT) 4 MG disintegrating tablet 4mg  ODT q4 hours prn nausea/vomit Patient taking differently: Take 4 mg by mouth every 8 (eight) hours as needed for nausea or vomiting. 12/10/20   Mesner, Corene Cornea, MD  pravastatin (PRAVACHOL) 10 MG tablet Take 10 mg by mouth at bedtime. 08/04/21   [provider]    Physical Exam: Vitals:   02/25/22 2230 02/25/22 2345 02/26/22 0000 02/26/22 0015  BP: 134/76 (!) 121/92 137/71 132/70  Pulse: 67 67 69 62  Resp: 15 15 16    Temp:   97.8 F (36.6 C)   TempSrc:   Oral   SpO2: 100% 100% 100% 100%  Weight:      Height:       Constitutional: NAD, calm, comfortable, elderly female laying upright in bed Eyes: lids and conjunctivae normal ENMT: Mucous membranes are moist.  Neck: normal, supple Respiratory: clear to auscultation bilaterally, no wheezing, no crackles. Normal respiratory effort. No accessory muscle use.  Cardiovascular: Regular rate and rhythm, no murmurs / rubs / gallops. No extremity edema.  Abdomen: no tenderness, Bowel sounds positive.  Colostomy bag in place with semisolid stool. Musculoskeletal: no clubbing / cyanosis. No joint deformity upper and lower extremities. Good ROM, no contractures. Normal muscle tone.  Skin: no rashes, lesions, ulcers. No induration Neurologic: CN 2-12 grossly intact.  Strength  5/5 in all 4.  Denies any symptoms of dizziness with sitting up. Psychiatric: Alert and oriented only to self and place. Data Reviewed:  See HPI  Assessment and Plan: * Syncope -reportedly found minimally responsive with head on kitchen table by family. SBP noted to be 70 by EMS and fluid responsive. Possible vasovagal. No focal neurological findings on exam to warrant any further imaging at this time. CT head was negative.  -pacemaker was interrogated without any abnormalities  -obtain orthostatic vital signs -obtain echocardiogram. Previous in 08/2021 showed only grade 1 diastolic dysfunction -Keep on continuous telemetry  Chronic diastolic CHF (congestive heart failure) ([place]) -last echo 08/2021 with EF of 60-65% and grade 1 diastolic dysfunction   Chronic kidney disease, stage 3b (HCC) -creatinine stable around baseline of 1.7-2  Asthma with chronic obstructive pulmonary disease (COPD) -No signs of acute exacerbation  Essential hypertension -reportedly found to be hypotensive with SBP in the 70s by EMS but normotensive after given fluid and  has remained stable since being ED. Does not appear to be any antihypertensives on med rec  Dementia without behavioral disturbance ([place]) -alert and oriented only to self and place currently  Hypomagnesemia -Mild Mg of 1.6. Repleted with IV Mg.   Colostomy present ([place]) -colostomy care daily  Diabetes mellitus with complication ([place]) Controlled. Monitor with daily labs for now.      Advance Care Planning: Full-will need to verify with family given her dementia  Consults: none  Family Communication: unable to reach grand-daughter with two different phone numbers  Severity of Illness: The appropriate patient status for this patient is OBSERVATION. Observation status is judged to be reasonable and necessary in order to provide the required intensity of service to ensure the patient's safety. The patient's presenting symptoms, physical  exam findings, and initial radiographic and laboratory data in the context of their medical condition is felt to place them at decreased risk for further clinical deterioration. Furthermore, it is anticipated that the patient will be medically stable for discharge from the hospital within 2 midnights of admission.   Author: Orene Desanctis, DO 02/26/2022 12:48 AM  For on call review [email or web address].

## 2022-02-25 NOTE — ED Notes (Signed)
RN attempted lab collection x2, phlem/second rn assistance to be requested

## 2022-02-26 ENCOUNTER — Observation Stay (HOSPITAL_BASED_OUTPATIENT_CLINIC_OR_DEPARTMENT_OTHER): Payer: Medicare Other

## 2022-02-26 DIAGNOSIS — I10 Essential (primary) hypertension: Secondary | ICD-10-CM | POA: Diagnosis not present

## 2022-02-26 DIAGNOSIS — R55 Syncope and collapse: Secondary | ICD-10-CM | POA: Diagnosis not present

## 2022-02-26 DIAGNOSIS — I5032 Chronic diastolic (congestive) heart failure: Secondary | ICD-10-CM | POA: Diagnosis not present

## 2022-02-26 LAB — CUP PACEART REMOTE DEVICE CHECK
Battery Remaining Longevity: 74 mo
Battery Remaining Percentage: 66 %
Battery Voltage: 3.01 V
Brady Statistic AP VP Percent: 1 %
Brady Statistic AP VS Percent: 74 %
Brady Statistic AS VP Percent: 1 %
Brady Statistic AS VS Percent: 25 %
Brady Statistic RA Percent Paced: 73 %
Brady Statistic RV Percent Paced: 1 %
Date Time Interrogation Session: 20231213185154
Implantable Lead Connection Status: 753985
Implantable Lead Connection Status: 753985
Implantable Lead Implant Date: 20200217
Implantable Lead Implant Date: 20200217
Implantable Lead Location: 753859
Implantable Lead Location: 753860
Implantable Pulse Generator Implant Date: 20200217
Lead Channel Impedance Value: 410 Ohm
Lead Channel Impedance Value: 410 Ohm
Lead Channel Pacing Threshold Amplitude: 0.5 V
Lead Channel Pacing Threshold Amplitude: 0.5 V
Lead Channel Pacing Threshold Pulse Width: 0.5 ms
Lead Channel Pacing Threshold Pulse Width: 0.5 ms
Lead Channel Sensing Intrinsic Amplitude: 12 mV
Lead Channel Sensing Intrinsic Amplitude: 2.3 mV
Lead Channel Setting Pacing Amplitude: 2 V
Lead Channel Setting Pacing Amplitude: 2.5 V
Lead Channel Setting Pacing Pulse Width: 0.5 ms
Lead Channel Setting Sensing Sensitivity: 2 mV
Pulse Gen Model: 2272
Pulse Gen Serial Number: 9107620

## 2022-02-26 LAB — BASIC METABOLIC PANEL
Anion gap: 7 (ref 5–15)
BUN: 29 mg/dL — ABNORMAL HIGH (ref 8–23)
CO2: 24 mmol/L (ref 22–32)
Calcium: 8.4 mg/dL — ABNORMAL LOW (ref 8.9–10.3)
Chloride: 111 mmol/L (ref 98–111)
Creatinine, Ser: 1.54 mg/dL — ABNORMAL HIGH (ref 0.44–1.00)
GFR, Estimated: 33 mL/min — ABNORMAL LOW (ref >60)
Glucose, Bld: 90 mg/dL (ref 70–99)
Potassium: 4.3 mmol/L (ref 3.5–5.1)
Sodium: 142 mmol/L (ref 135–145)

## 2022-02-26 LAB — ECHOCARDIOGRAM COMPLETE
AR max vel: 1.89 cm2
AV Area VTI: 1.94 cm2
AV Area mean vel: 1.79 cm2
AV Mean grad: 5 mmHg
AV Peak grad: 8.1 mmHg
Ao pk vel: 1.42 m/s
Area-P 1/2: 2.31 cm2
Height: 64 in
S' Lateral: 1.9 cm
Weight: 1904.77 oz

## 2022-02-26 LAB — URINALYSIS, ROUTINE W REFLEX MICROSCOPIC
Bilirubin Urine: NEGATIVE
Glucose, UA: NEGATIVE mg/dL
Hgb urine dipstick: NEGATIVE
Ketones, ur: NEGATIVE mg/dL
Nitrite: NEGATIVE
Protein, ur: NEGATIVE mg/dL
Specific Gravity, Urine: 1.013 (ref 1.005–1.030)
pH: 5 (ref 5.0–8.0)

## 2022-02-26 LAB — RESP PANEL BY RT-PCR (RSV, FLU A&B, COVID)  RVPGX2
Influenza A by PCR: NEGATIVE
Influenza B by PCR: NEGATIVE
Resp Syncytial Virus by PCR: NEGATIVE
SARS Coronavirus 2 by RT PCR: NEGATIVE

## 2022-02-26 LAB — CBG MONITORING, ED
Glucose-Capillary: 130 mg/dL — ABNORMAL HIGH (ref 70–99)
Glucose-Capillary: 63 mg/dL — ABNORMAL LOW (ref 70–99)
Glucose-Capillary: 185 mg/dL — ABNORMAL HIGH (ref 70–99)

## 2022-02-26 LAB — MAGNESIUM: Magnesium: 2.4 mg/dL (ref 1.7–2.4)

## 2022-02-26 MED ORDER — DEXTROSE IN LACTATED RINGERS 5 % IV SOLN: INTRAVENOUS | Status: AC

## 2022-02-26 MED ORDER — ENOXAPARIN SODIUM 30 MG/0.3ML IJ SOSY
30.0000 mg | PREFILLED_SYRINGE | Freq: Every day | INTRAMUSCULAR | Status: DC
  Administered 2022-02-26 – 2022-02-28 (×3): 30 mg via SUBCUTANEOUS
  Filled 2022-02-26 (×3): qty 0.3

## 2022-02-26 MED ORDER — SODIUM CHLORIDE 0.9% FLUSH
3.0000 mL | Freq: Two times a day (BID) | INTRAVENOUS | Status: DC
  Administered 2022-02-26 – 2022-02-28 (×6): 3 mL via INTRAVENOUS

## 2022-02-26 NOTE — ED Notes (Signed)
Pt given orange juice and crackers with peanut butter. RN assisted pt in eating and drinking.

## 2022-02-26 NOTE — Assessment & Plan Note (Signed)
-  Mild Mg of 1.6. Repleted with IV Mg.

## 2022-02-26 NOTE — ED Notes (Signed)
Provider paged for pt's low blood sugar.

## 2022-02-26 NOTE — Care Management Obs Status (Signed)
[facility] NOTIFICATION   Patient Details  Name: SHAREA GUINTHER MRN: [id number] Date of Birth: [birth year]   Medicare Observation Status Notification Given:  Yes    Bethena Roys, RN 02/26/2022, 4:18 PM

## 2022-02-26 NOTE — ED Notes (Signed)
Pt aware that she need a urine sample

## 2022-02-26 NOTE — Assessment & Plan Note (Signed)
-  last echo 08/2021 with EF of 60-65% and grade 1 diastolic dysfunction

## 2022-02-26 NOTE — Assessment & Plan Note (Signed)
-  colostomy care daily  

## 2022-02-26 NOTE — Assessment & Plan Note (Signed)
-  reportedly found to be hypotensive with SBP in the 70s by EMS but normotensive after given fluid and has remained stable since being ED. Does not appear to be any antihypertensives on med rec

## 2022-02-26 NOTE — ED Notes (Signed)
Patient resting on stretcher, brief checked patient is clean ,purewick in place, clean linens placed on bed. Patient repositioned and sat up in bed for her breakfast tray.

## 2022-02-26 NOTE — Assessment & Plan Note (Signed)
Controlled. Monitor with daily labs for now.

## 2022-02-26 NOTE — Care Management (Addendum)
  Transition of Care (TOC) Screening Note   Patient Details  Name: [name] Date of Birth: 01-06-1934   Transition of Care [facility]) CM/SW Contact:    Bethena Roys, RN Phone Number: 02/26/2022, 4:32 PM    Transition of Care Department [facility]) has reviewed the patient and no TOC needs have been identified at this time. PTA patient was from home with granddaughter [name]. Case Manager spoke with [name] and she states she does not drive; however, a neighbor takes the patient to all appointments. [name] states the patient gets her medications without any issues. Granddaughter states the patient has used [facility] in the past for home health services. We will continue to monitor patient advancement through interdisciplinary progression rounds. If new patient transition needs arise, please place a TOC consult.

## 2022-02-26 NOTE — Assessment & Plan Note (Signed)
-  creatinine stable around baseline of 1.7-2

## 2022-02-26 NOTE — ED Notes (Signed)
Patient daughter called I attempted to call her back [name] [phone] no answer , no availability to  leave message.

## 2022-02-26 NOTE — ED Notes (Signed)
Spoke to pt's daughter on phone and gave daughter update.

## 2022-02-26 NOTE — Assessment & Plan Note (Signed)
Old records personally reviewed, neurology follow up from 12/2020. Seropositive ocular myasthenia gravis, no significant bulbar, or limb weakness noted.  He is not longer on Mestinon or prednisone, never treated with long term steroids sparing agent.   

## 2022-02-26 NOTE — Assessment & Plan Note (Addendum)
-  reportedly found minimally responsive with head on kitchen table by family. SBP noted to be 70 by EMS and fluid responsive. Possible vasovagal. No focal neurological findings on exam to warrant any further imaging at this time. CT head was negative.  -pacemaker was interrogated without any abnormalities  -obtain orthostatic vital signs -obtain echocardiogram. Previous in 08/2021 showed only grade 1 diastolic dysfunction -Keep on continuous telemetry

## 2022-02-26 NOTE — Progress Notes (Signed)
  Echocardiogram 2D Echocardiogram has been performed.  [name] 02/26/2022, 5:31 PM

## 2022-02-26 NOTE — Progress Notes (Addendum)
[facility] PROGRESS NOTE   [name] FXT:[id number] DOB: 1933-12-21 DOA: 02/25/2022  PCP: Leeroy Cha, MD  Brief History/Interval Summary: 88 y.o. female with medical history significant of Dementia, sinus bradycardia s/p pacemaker, hypertension, chronic diastolic heart failure, asthma/COPD, type 2 diabetes, CKD 3B, s/p colostomy who presents with syncopal episode. Patient had a similar episode of transient altered mental status/delayed responsiveness back in 08/2021.  She had extensive workup with multiple interrogation of her pacemaker which was without overt findings.  Neurology was also consulted for possible seizure with continuous EEG which was negative despite tremors noted on video.  Echocardiogram showed grade 1 diastolic dysfunction but otherwise unremarkable.  Initially had positive orthostatics but ultimately was negative with standing.  She was discharged with further outpatient workup with PCP. In the ED, her pacemaker was interrogated without any abnormal findings.  No significant electrolyte abnormalities compared with her baseline.  Troponin of 5.  BNP of 83. CT head was negative with possible left maxillary sinusitis.  Consultants: Cardiology  Procedures: None    Subjective/Interval History: Patient pleasantly confused.  Denies any chest pain or shortness of breath.  Overnight events noted.  Concern for ventricular tachycardia.    Assessment/Plan:  Syncope Was found to be minimally responsive with head on the kitchen table by family members.  Systolic blood pressure was noted to be in the 70s.  Was given IV fluids with improvement. Pacemaker was apparently interrogated in the ED without any abnormalities. Echocardiogram has been ordered and is pending.  Previous echocardiogram showed only grade 1 diastolic dysfunction. Overnight patient has had episodes of nonsustained ventricular tachycardia.  Some of the rhythm abnormalities on telemetry are  artifactual.  Will request cardiology to consult. Patient currently is hemodynamically stable.  Telemetry shows sinus rhythm.  Chronic diastolic CHF Last echocardiogram in June showed EF of 60 to 65%.  Seems to be euvolemic.  Chronic kidney disease stage IIIb Renal function stable for the most part.  Magnesium 2.4 today.  History of asthma/COPD Stable.  Essential hypertension Initially had hypotension per EMS which responded to fluid.  Not on any antihypertensives.  Blood pressures have stabilized.  History of dementia Stable.  Medication reconciliation has not been done yet.  May be on Aricept and Namenda prior to admission but will need to be verified prior to initiating.  Hypomagnesemia Corrected  Colostomy in situ Patient does not remember.  The reason for this.  Placed several years ago.  Diabetes mellitus type 2 with renal complications, chronic kidney disease stage IIIb Hypoglycemic episode noted this morning.  Likely due to inadequate oral intake in the last 24 hours.  Monitor CBGs.   DVT Prophylaxis: Lovenox Code Status: Full code Family Communication: Discussed with the patient Disposition Plan: Hopefully return home when improved.  Will request PT and OT evaluation  Status is: Observation The patient remains OBS appropriate and may d/c before 2 midnights.      Medications: Scheduled:  enoxaparin (LOVENOX) injection  30 mg Subcutaneous Daily   sodium chloride flush  3 mL Intravenous Q12H   Continuous:  dextrose 5% lactated ringers 50 mL/hr at 02/26/22 0459   PRN:  Antibiotics: Anti-infectives (From admission, onward)    None       Objective:  Vital Signs  Vitals:   02/26/22 0600 02/26/22 0615 02/26/22 0630 02/26/22 0749  BP: (!) 144/71 138/69 138/69   Pulse: 60 65 61   Resp: 15 13 13    Temp:    98.8 F (37.1 C)  TempSrc:      SpO2: 100% 100% 100%   Weight:      Height:       No intake or output data in the 24 hours ending 02/26/22  0856 Filed Weights   02/25/22 1815  Weight: 54 kg    General appearance: Awake alert.  In no distress.  Pleasantly confused Resp: Clear to auscultation bilaterally.  Normal effort Cardio: S1-S2 is normal regular.  No S3-S4.  No rubs murmurs or bruit GI: Abdomen is soft.  Nontender nondistended.  Bowel sounds are present normal.  No masses organomegaly Extremities: No edema.  Moving all 4 extremities. No obvious focal neurological deficits noted.   Lab Results:  Data Reviewed: I have personally reviewed following labs and reports of the imaging studies  CBC: Recent Labs  Lab 02/25/22 2018  WBC 6.0  NEUTROABS 5.2  HGB 11.5*  HCT 35.2*  MCV 94.4  PLT 137*    Basic Metabolic Panel: Recent Labs  Lab 02/25/22 2018 02/26/22 0100 02/26/22 0527  NA 140 142  --   K 4.7 4.3  --   CL 107 111  --   CO2 25 24  --   GLUCOSE 131* 90  --   BUN 34* 29*  --   CREATININE 1.83* 1.54*  --   CALCIUM 9.3 8.4*  --   MG 1.6*  --  2.4    GFR: Estimated Creatinine Clearance: 22.4 mL/min (A) (by C-G formula based on SCr of 1.54 mg/dL (H)).  Liver Function Tests: Recent Labs  Lab 02/25/22 2018  AST 15  ALT 10  ALKPHOS 58  BILITOT 0.6  PROT 6.5  ALBUMIN 3.2*     Coagulation Profile: Recent Labs  Lab 02/25/22 2118  INR 1.0     CBG: Recent Labs  Lab 02/25/22 1831 02/26/22 0434 02/26/22 0507 02/26/22 0741  GLUCAP 137* 63* 130* 185*     Recent Results (from the past 240 hour(s))  Resp panel by RT-PCR (RSV, Flu A&B, Covid) Anterior Nasal Swab     Status: None   Collection Time: 02/25/22 10:43 PM   Specimen: Anterior Nasal Swab  Result Value Ref Range Status   SARS Coronavirus 2 by RT PCR NEGATIVE NEGATIVE Final    Comment: (NOTE) SARS-CoV-2 target nucleic acids are NOT DETECTED.  The SARS-CoV-2 RNA is generally detectable in upper respiratory specimens during the acute phase of infection. The lowest concentration of SARS-CoV-2 viral copies this assay can  detect is 138 copies/mL. A negative result does not preclude SARS-Cov-2 infection and should not be used as the sole basis for treatment or other patient management decisions. A negative result may occur with  improper specimen collection/handling, submission of specimen other than nasopharyngeal swab, presence of viral mutation(s) within the areas targeted by this assay, and inadequate number of viral copies(<138 copies/mL). A negative result must be combined with clinical observations, patient history, and epidemiological information. The expected result is Negative.  Fact Sheet for Patients:  EntrepreneurPulse.com.au  Fact Sheet for Healthcare Providers:  IncredibleEmployment.be  This test is no t yet approved or cleared by the [place] FDA and  has been authorized for detection and/or diagnosis of SARS-CoV-2 by FDA under an Emergency Use Authorization (EUA). This EUA will remain  in effect (meaning this test can be used) for the duration of the COVID-19 declaration under Section 564(b)(1) of the Act, 21 U.S.C.section 360bbb-3(b)(1), unless the authorization is terminated  or revoked sooner.       Influenza  A by PCR NEGATIVE NEGATIVE Final   Influenza B by PCR NEGATIVE NEGATIVE Final    Comment: (NOTE) The Xpert Xpress SARS-CoV-2/FLU/RSV plus assay is intended as an aid in the diagnosis of influenza from Nasopharyngeal swab specimens and should not be used as a sole basis for treatment. Nasal washings and aspirates are unacceptable for Xpert Xpress SARS-CoV-2/FLU/RSV testing.  Fact Sheet for Patients: EntrepreneurPulse.com.au  Fact Sheet for Healthcare Providers: IncredibleEmployment.be  This test is not yet approved or cleared by the [place] FDA and has been authorized for detection and/or diagnosis of SARS-CoV-2 by FDA under an Emergency Use Authorization (EUA). This EUA will remain in  effect (meaning this test can be used) for the duration of the COVID-19 declaration under Section 564(b)(1) of the Act, 21 U.S.C. section 360bbb-3(b)(1), unless the authorization is terminated or revoked.     Resp Syncytial Virus by PCR NEGATIVE NEGATIVE Final    Comment: (NOTE) Fact Sheet for Patients: EntrepreneurPulse.com.au  Fact Sheet for Healthcare Providers: IncredibleEmployment.be  This test is not yet approved or cleared by the [place] FDA and has been authorized for detection and/or diagnosis of SARS-CoV-2 by FDA under an Emergency Use Authorization (EUA). This EUA will remain in effect (meaning this test can be used) for the duration of the COVID-19 declaration under Section 564(b)(1) of the Act, 21 U.S.C. section 360bbb-3(b)(1), unless the authorization is terminated or revoked.  Performed at [facility], [place] [address], [place], [place] [address]       Radiology Studies: DG Chest Port 1 View  Result Date: 02/25/2022 CLINICAL DATA:  Syncope EXAM: PORTABLE CHEST 1 VIEW COMPARISON:  Previous studies including the examination of 12/17/2021 FINDINGS: Cardiac size is within normal limits. Low position of diaphragms may suggest COPD. Thoracic aorta is tortuous and ectatic. Pacemaker battery is seen in the left infraclavicular region with tips of leads in right atrium and right ventricle. Lung fields are clear of any infiltrates or pulmonary edema. There is no pleural effusion or pneumothorax. Degenerative changes are noted in both shoulders. IMPRESSION: There are no signs of pulmonary edema or focal pulmonary consolidation. Electronically Signed   By: Elmer Picker M.D.   On: 02/25/2022 19:53   CT HEAD WO CONTRAST  Result Date: 02/25/2022 CLINICAL DATA:  Syncope/presyncope, cerebrovascular cause suspected. EXAM: CT HEAD WITHOUT CONTRAST TECHNIQUE: Contiguous axial images were obtained from the base of the skull through  the vertex without intravenous contrast. RADIATION DOSE REDUCTION: This exam was performed according to the departmental dose-optimization program which includes automated exposure control, adjustment of the mA and/or kV according to patient size and/or use of iterative reconstruction technique. COMPARISON:  Head CT 08/25/2021 and MRI 11/22/2017 FINDINGS: Brain: There is no evidence of an acute infarct, intracranial hemorrhage, mass, midline shift, or extra-axial fluid collection. 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 Cerebral white matter hypodensities are unchanged and nonspecific but compatible with mild chronic small vessel ischemic disease. There is mild cerebral atrophy. A partially empty sella and mild cerebellar tonsillar ectopia are again noted. Vascular: Calcified atherosclerosis at the skull base. No hyperdense vessel. Skull: No acute fracture or suspicious osseous lesion. Sinuses/Orbits: New moderate volume fluid in the included portion of the left maxillary sinus. Unchanged mild left ethmoid air cell opacification. No significant mastoid fluid. Unremarkable orbits. Other: None. IMPRESSION: 1. No evidence of acute intracranial abnormality. 2. Mild chronic small vessel ischemic disease. Unchanged chronic right cerebellar infarct. 3. New left maxillary sinus fluid. Correlate for acute sinusitis. Electronically  Signed   By: Logan Bores M.D.   On: 02/25/2022 19:14       LOS: 0 days   [facility] Pager on [email or web address]  02/26/2022, 8:56 AM

## 2022-02-26 NOTE — ED Notes (Signed)
Pt's monitor intermittently rang v-tach about 4-5 times for about 3-15 seconds. EKG captured. MD notified. Pt in no acute distress. Pt's vitals otherwise stable. Pt alert. Pt asymptomatic.

## 2022-02-26 NOTE — Consult Note (Signed)
Cardiology Consultation   Patient ID: [name] MRN: [id number]; DOB: 1933/08/06  Admit date: 02/25/2022 Date of Consult: 02/26/2022  PCP:  Leeroy Cha, [facility] Cardiologist:  Elouise Munroe, MD  Electrophysiologist:  Cristopher Peru, MD     Patient Profile:   [name] is a 88 y.o. female with a hx of sinus bradycardia s/p pacemaker, HTN, chronic diastolic heart failure, dementia, asthma/COPD, type 2 DM, CKD stage IIIb, s/p colostomy who is being seen 02/26/2022 for the evaluation of syncope, NSVT at the request of Dr. Maryland Pink.   History of Present Illness:   [name] is an 88 year old female with above medical history who is followed by Dr. Margaretann Loveless and Dr. Lovena Le.   Per chart review, patient was referred to cardiology in 2019 for evaluation of symptomatic bradycardia. Echocardiogram on 12/17/2017 showed EF 65-70%, no regional wall motion abnormalities, grade II diastolic dysfunction, moderate pulmonary htn. She was referred to EP, and had a [employer] PPM implanted on 05/02/2018. Patient was last seen by cardiology on 12/22/2019. At that time, patient was doing well.   Patient was brought in by EMS on 12/13 after she had a syncopal episode at home. A family member found the patient sitting at the kitchen table with difficulty walking up. Patient has dementia, so she was unable to describe the events. When EM arrive, patient was hypotensive with SBP in the 70s. She was given 100 cc Iv fluids. Vital signs upon arrival to the ED showed BP 99/58, HR 60 BPM, spO2 98% on room air. Labs in the ED showed Na 140, K 4.7, creatinine 1.83, mag 1.6, WBC 6.0, hemoglobin 11.5, platelets 137. Hstn 6>5.   EKG showed an atrial paced rhythm, HR 60 BPM, nonspecific IVCD. CXR showed no signs of pulmonary edema or focal pulmonary consolidation. CT head showed no evidence of acute intracranial abnormality. Pacemaker was interrogated in the ED and showed no  abnormalities noted, device functioning normally.   On interview, patient is oriented to person. However, she is not oriented to time or place. She is unaware that she is in the hospital and does not remember losing consciousness. Does not recall having a pacemaker put in or ever having syncopal events in the past.     Past Medical History:  Diagnosis Date   Asthma    CKD (chronic kidney disease) stage 3, GFR 30-59 ml/min (HCC) 05/01/2018   Colostomy present ([place])    hx/notes 07/17/2010   Dementia ([place])    Diabetic neuropathy ([place])    Hypertension    hx/notes 07/17/2010   Pneumonia    recent/notes 10/08/2016   Symptomatic bradycardia 12/15/2017   Type II diabetes mellitus ([place])    hx/notes 07/17/2010    Past Surgical History:  Procedure Laterality Date   ABDOMINAL HYSTERECTOMY     COLOSTOMY     S/P SBO hx/notes 07/17/2010   LUMBAR FUSION  11/2006   hx/notes 07/17/2010   NASAL SINUS SURGERY  04/28/2002   Bilateral inferior turbinate reductions, bilateral maxillary antrotomies, bilateral total ethmoidectomies, bilateral frontal recess explorations, bilateral sphenoidotomies, Instatrac guidance./notes 07/28/2010   PACEMAKER IMPLANT N/A 05/02/2018   Procedure: PACEMAKER IMPLANT;  Surgeon: Evans Lance, MD;  Location: [facility];  Service: Cardiovascular;  Laterality: N/A;     Home Medications:  Prior to Admission medications   Medication Sig Start Date End Date Taking? Authorizing Provider  busPIRone (BUSPAR) 5 MG tablet Take 1 tablet (5  mg total) by mouth 3 (three) times daily. 04/29/20  Yes Medina-Vargas, Monina C, NP  donepezil (ARICEPT) 10 MG tablet Take 1 tablet (10 mg total) by mouth at bedtime. 09/05/20  Yes Nita Sells, MD  memantine (NAMENDA XR) 28 MG CP24 24 hr capsule Take 1 capsule (28 mg total) by mouth every morning. 09/05/20  Yes Nita Sells, MD  pravastatin (PRAVACHOL) 10 MG tablet Take 10 mg by mouth at bedtime. 08/04/21  Yes [provider]   albuterol (VENTOLIN HFA) 108 (90 Base) MCG/ACT inhaler Inhale 2 puffs into the lungs every 4 (four) hours as needed for wheezing or shortness of breath. Patient not taking: Reported on 02/26/2022 04/29/20   Medina-Vargas, Monina C, NP  ondansetron (ZOFRAN ODT) 4 MG disintegrating tablet 4mg  ODT q4 hours prn nausea/vomit Patient not taking: Reported on 02/26/2022 12/10/20   Mesner, Corene Cornea, MD    Inpatient Medications: Scheduled Meds:  enoxaparin (LOVENOX) injection  30 mg Subcutaneous Daily   sodium chloride flush  3 mL Intravenous Q12H   Continuous Infusions:  dextrose 5% lactated ringers 50 mL/hr at 02/26/22 0459   PRN Meds:   Allergies:    Allergies  Allergen Reactions   Aspirin Other (See Comments)    Triggers asthma    Social History:   Social History   Socioeconomic History   Marital status: Single    Spouse name: Not on file   Number of children: Not on file   Years of education: Not on file   Highest education level: Not on file  Occupational History   Not on file  Tobacco Use   Smoking status: Never   Smokeless tobacco: Former    Types: Chew    Quit date: 02/27/2017  Vaping Use   Vaping Use: Never used  Substance and Sexual Activity   Alcohol use: No   Drug use: No   Sexual activity: Never  Other Topics Concern   Not on file  Social History Narrative   Not on file   Social Determinants of Health   Financial Resource Strain: Not on file  Food Insecurity: Not on file  Transportation Needs: Not on file  Physical Activity: Not on file  Stress: Not on file  Social Connections: Not on file  Intimate Partner Violence: Not on file    Family History:    Family History  Problem Relation Age of Onset   Kidney disease Mother    Hypertension Mother    Other Father        gangrene with amputation   Diabetes Sister    Cancer Brother    Heart disease Brother    Schizophrenia Daughter    Diabetes Daughter    Stroke Daughter    Diabetes Sister     Breast cancer Neg Hx      ROS:  Please see the history of present illness.   All other ROS reviewed and negative.     Physical Exam/Data:   Vitals:   02/26/22 1030 02/26/22 1156 02/26/22 1245 02/26/22 1256  BP: 136/64 128/83 135/70   Pulse: 65 61 60   Resp: 18 20 16    Temp:  98.7 F (37.1 C)  98.8 F (37.1 C)  TempSrc:    Oral  SpO2: 100% 100% 100%   Weight:      Height:       No intake or output data in the 24 hours ending 02/26/22 1258    02/25/2022    6:15 PM 09/03/2020    8:00  PM 09/01/2020    9:19 AM  Last 3 Weights  Weight (lbs) 119 lb 0.8 oz 120 lb 1.6 oz 119 lb 1.6 oz  Weight (kg) 54 kg 54.477 kg 54.023 kg     Body mass index is 20.43 kg/m.  General:  Frail, elderly female. Laying comfortably in the bed. Head elevated  HEENT: normal Neck: no JVD Vascular: No carotid bruits; Radial pulses 2+ bilaterally Cardiac:  normal S1, S2; RRR; no murmur  Lungs:  clear to auscultation bilaterally, no wheezing, rhonchi or rales  Ext: no edema Musculoskeletal:  No deformities, BUE and BLE strength normal and equal Skin: warm and dry  Neuro:  CNs 2-12 intact, no focal abnormalities noted Psych:  Normal affect   EKG:  The EKG was personally reviewed and demonstrates: atrial paced rhythm, HR 60 BPM, nonspecific IVCD Telemetry:  Telemetry was personally reviewed and demonstrates:  Patient not on telemetry   Relevant CV Studies:   Laboratory Data:  High Sensitivity Troponin:   Recent Labs  Lab 02/25/22 2018 02/25/22 2058  TROPONINIHS 6 5     Chemistry Recent Labs  Lab 02/25/22 2018 02/26/22 0100 02/26/22 0527  NA 140 142  --   K 4.7 4.3  --   CL 107 111  --   CO2 25 24  --   GLUCOSE 131* 90  --   BUN 34* 29*  --   CREATININE 1.83* 1.54*  --   CALCIUM 9.3 8.4*  --   MG 1.6*  --  2.4  GFRNONAA 27* 33*  --   ANIONGAP 8 7  --     Recent Labs  Lab 02/25/22 2018  PROT 6.5  ALBUMIN 3.2*  AST 15  ALT 10  ALKPHOS 58  BILITOT 0.6   Lipids No results  for input(s): "CHOL", "TRIG", "HDL", "LABVLDL", "LDLCALC", "CHOLHDL" in the last 168 hours.  Hematology Recent Labs  Lab 02/25/22 2018  WBC 6.0  RBC 3.73*  HGB 11.5*  HCT 35.2*  MCV 94.4  MCH 30.8  MCHC 32.7  RDW 12.8  PLT 137*   Thyroid No results for input(s): "TSH", "FREET4" in the last 168 hours.  BNP Recent Labs  Lab 02/25/22 2018  BNP 83.6    DDimer No results for input(s): "DDIMER" in the last 168 hours.   Radiology/Studies:  CUP PACEART REMOTE DEVICE CHECK  Result Date: 02/26/2022 Scheduled remote reviewed. Normal device function.  1 PMT appropriately treated and terminated Next remote 91 days. LA  DG Chest Port 1 View  Result Date: 02/25/2022 CLINICAL DATA:  Syncope EXAM: PORTABLE CHEST 1 VIEW COMPARISON:  Previous studies including the examination of 12/17/2021 FINDINGS: Cardiac size is within normal limits. Low position of diaphragms may suggest COPD. Thoracic aorta is tortuous and ectatic. Pacemaker battery is seen in the left infraclavicular region with tips of leads in right atrium and right ventricle. Lung fields are clear of any infiltrates or pulmonary edema. There is no pleural effusion or pneumothorax. Degenerative changes are noted in both shoulders. IMPRESSION: There are no signs of pulmonary edema or focal pulmonary consolidation. Electronically Signed   By: Elmer Picker M.D.   On: 02/25/2022 19:53   CT HEAD WO CONTRAST  Result Date: 02/25/2022 CLINICAL DATA:  Syncope/presyncope, cerebrovascular cause suspected. EXAM: CT HEAD WITHOUT CONTRAST TECHNIQUE: Contiguous axial images were obtained from the base of the skull through the vertex without intravenous contrast. RADIATION DOSE REDUCTION: This exam was performed according to the departmental dose-optimization program which includes automated  exposure control, adjustment of the mA and/or kV according to patient size and/or use of iterative reconstruction technique. COMPARISON:  Head CT 08/25/2021  and MRI 11/22/2017 FINDINGS: Brain: There is no evidence of an acute infarct, intracranial hemorrhage, mass, midline shift, or extra-axial fluid collection. 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 Cerebral white matter hypodensities are unchanged and nonspecific but compatible with mild chronic small vessel ischemic disease. There is mild cerebral atrophy. A partially empty sella and mild cerebellar tonsillar ectopia are again noted. Vascular: Calcified atherosclerosis at the skull base. No hyperdense vessel. Skull: No acute fracture or suspicious osseous lesion. Sinuses/Orbits: New moderate volume fluid in the included portion of the left maxillary sinus. Unchanged mild left ethmoid air cell opacification. No significant mastoid fluid. Unremarkable orbits. Other: None. IMPRESSION: 1. No evidence of acute intracranial abnormality. 2. Mild chronic small vessel ischemic disease. Unchanged chronic right cerebellar infarct. 3. New left maxillary sinus fluid. Correlate for acute sinusitis. Electronically Signed   By: Logan Bores M.D.   On: 02/25/2022 19:14     Assessment and Plan:   Syncope  Bradycardia s/p PPM  - Patient has a history of symptomatic bradycardia that caused syncope/falls. Had a St. Jude PPM implanted in 04/2018 - Patient has dementia-- not oriented to place or time. Not able to tell me why she is in the emergency department  - hstn negative x2 - Device interrogated yesterday in the ED -- showed no abnormalities and normal device function  - Ordered echocardiogram  - blood sugar 137 on arrival - Consider monitor at DC  NSVT - Reportedly, telemetry overnight showed brief episodes of NSVT, lasting between 3-15 seconds per episode. Unfortunately, patient was taken off telemetry and moved to a hallway bed.  No rhythm strips saved to epic  - EKGs captured overnight showed sinus rhythm with artifact. No NSVT noted on EKG  - If patient has recurrence of NSVT, can start a BB (Note HR in  the 60s, but BB will not cause bradycardia as patient has a normally functioning PPM)  - Maintain K>4, mag>2 - Resume telemetry monitoring when patient arrives on the floor   Otherwise per primary  - CKD stage IIIb - COPD/asthma - Dementia - Hypomagnesemia  - Colostomy in situ - Type 2 DM   Risk Assessment/Risk Scores:       For questions or updates, please contact [place] Please consult [email or web address] for contact info under    Signed, Margie Billet, PA-C  02/26/2022 12:58 PM  History and all data above reviewed.  Patient examined.  I agree with the findings as above.  I tried to reach her contact on both numbers.  Unable to reach her.  The patient does not recall any details of the event.  The patient denies any new symptoms such as chest discomfort, neck or arm discomfort. There has been no new shortness of breath, PND or orthopnea. There have been no reported palpitations, presyncope or syncope. The patient exam reveals COR:RRR  ,  Lungs: Clear  ,  Abd: Positive bowel sounds, no rebound no guarding, Ext No edema  .  All available labs, radiology testing, previous records reviewed. Agree with documented assessment and plan.  Syncope:  Etiology is unclear.  Unable to find or review rhythm strips from yesterday.  EKG with artifact.  Echo is pending.  Device interrogation demonstrates normal function.    Syncope:  Unable to verify events.  No clear cardiac etiology.  The pacemaker is functioning  normally.  I don't suspect a ventricular arrhythmia.  Echo is pending.  I don't see orthostatics and will order these.  However, I don't suspect that there will be any other testing indicated this admission if there are no definitive arrhythmias.    Jeneen Rinks Bee Marchiano  3:44 PM  02/26/2022

## 2022-02-26 NOTE — Assessment & Plan Note (Signed)
-  alert and oriented only to self and place currently

## 2022-02-27 DIAGNOSIS — I472 Ventricular tachycardia, unspecified: Secondary | ICD-10-CM | POA: Diagnosis not present

## 2022-02-27 DIAGNOSIS — Z7983 Long term (current) use of bisphosphonates: Secondary | ICD-10-CM | POA: Diagnosis not present

## 2022-02-27 DIAGNOSIS — I77819 Aortic ectasia, unspecified site: Secondary | ICD-10-CM | POA: Diagnosis present

## 2022-02-27 DIAGNOSIS — F0390 Unspecified dementia without behavioral disturbance: Secondary | ICD-10-CM | POA: Diagnosis present

## 2022-02-27 DIAGNOSIS — R296 Repeated falls: Secondary | ICD-10-CM | POA: Diagnosis present

## 2022-02-27 DIAGNOSIS — R55 Syncope and collapse: Secondary | ICD-10-CM | POA: Diagnosis present

## 2022-02-27 DIAGNOSIS — E860 Dehydration: Secondary | ICD-10-CM | POA: Diagnosis not present

## 2022-02-27 DIAGNOSIS — R001 Bradycardia, unspecified: Secondary | ICD-10-CM | POA: Diagnosis present

## 2022-02-27 DIAGNOSIS — N1832 Chronic kidney disease, stage 3b: Secondary | ICD-10-CM | POA: Diagnosis present

## 2022-02-27 DIAGNOSIS — E1140 Type 2 diabetes mellitus with diabetic neuropathy, unspecified: Secondary | ICD-10-CM | POA: Diagnosis present

## 2022-02-27 DIAGNOSIS — E869 Volume depletion, unspecified: Secondary | ICD-10-CM | POA: Diagnosis present

## 2022-02-27 DIAGNOSIS — Z90710 Acquired absence of both cervix and uterus: Secondary | ICD-10-CM | POA: Diagnosis not present

## 2022-02-27 DIAGNOSIS — Z7401 Bed confinement status: Secondary | ICD-10-CM | POA: Diagnosis not present

## 2022-02-27 DIAGNOSIS — Z79899 Other long term (current) drug therapy: Secondary | ICD-10-CM | POA: Diagnosis not present

## 2022-02-27 DIAGNOSIS — N179 Acute kidney failure, unspecified: Secondary | ICD-10-CM | POA: Diagnosis not present

## 2022-02-27 DIAGNOSIS — Z743 Need for continuous supervision: Secondary | ICD-10-CM | POA: Diagnosis not present

## 2022-02-27 DIAGNOSIS — R404 Transient alteration of awareness: Secondary | ICD-10-CM | POA: Diagnosis not present

## 2022-02-27 DIAGNOSIS — J4489 Other specified chronic obstructive pulmonary disease: Secondary | ICD-10-CM | POA: Diagnosis present

## 2022-02-27 DIAGNOSIS — I951 Orthostatic hypotension: Secondary | ICD-10-CM | POA: Diagnosis present

## 2022-02-27 DIAGNOSIS — E11649 Type 2 diabetes mellitus with hypoglycemia without coma: Secondary | ICD-10-CM | POA: Diagnosis not present

## 2022-02-27 DIAGNOSIS — R2681 Unsteadiness on feet: Secondary | ICD-10-CM | POA: Diagnosis not present

## 2022-02-27 DIAGNOSIS — E1122 Type 2 diabetes mellitus with diabetic chronic kidney disease: Secondary | ICD-10-CM | POA: Diagnosis present

## 2022-02-27 DIAGNOSIS — I5032 Chronic diastolic (congestive) heart failure: Secondary | ICD-10-CM | POA: Diagnosis present

## 2022-02-27 DIAGNOSIS — Z9181 History of falling: Secondary | ICD-10-CM | POA: Diagnosis not present

## 2022-02-27 DIAGNOSIS — Z933 Colostomy status: Secondary | ICD-10-CM | POA: Diagnosis not present

## 2022-02-27 DIAGNOSIS — I130 Hypertensive heart and chronic kidney disease with heart failure and stage 1 through stage 4 chronic kidney disease, or unspecified chronic kidney disease: Secondary | ICD-10-CM | POA: Diagnosis present

## 2022-02-27 DIAGNOSIS — Z1152 Encounter for screening for COVID-19: Secondary | ICD-10-CM | POA: Diagnosis not present

## 2022-02-27 DIAGNOSIS — Z87891 Personal history of nicotine dependence: Secondary | ICD-10-CM | POA: Diagnosis not present

## 2022-02-27 LAB — BASIC METABOLIC PANEL
Anion gap: 7 (ref 5–15)
Anion gap: 6 (ref 5–15)
BUN: 37 mg/dL — ABNORMAL HIGH (ref 8–23)
BUN: 29 mg/dL — ABNORMAL HIGH (ref 8–23)
CO2: 25 mmol/L (ref 22–32)
CO2: 26 mmol/L (ref 22–32)
Calcium: 8.3 mg/dL — ABNORMAL LOW (ref 8.9–10.3)
Calcium: 8.1 mg/dL — ABNORMAL LOW (ref 8.9–10.3)
Chloride: 105 mmol/L (ref 98–111)
Chloride: 105 mmol/L (ref 98–111)
Creatinine, Ser: 2.01 mg/dL — ABNORMAL HIGH (ref 0.44–1.00)
Creatinine, Ser: 1.67 mg/dL — ABNORMAL HIGH (ref 0.44–1.00)
GFR, Estimated: 24 mL/min — ABNORMAL LOW (ref >60)
GFR, Estimated: 30 mL/min — ABNORMAL LOW (ref >60)
Glucose, Bld: 175 mg/dL — ABNORMAL HIGH (ref 70–99)
Glucose, Bld: 106 mg/dL — ABNORMAL HIGH (ref 70–99)
Potassium: 4.9 mmol/L (ref 3.5–5.1)
Potassium: 4.4 mmol/L (ref 3.5–5.1)
Sodium: 137 mmol/L (ref 135–145)
Sodium: 137 mmol/L (ref 135–145)

## 2022-02-27 LAB — CBC
HCT: 28.3 % — ABNORMAL LOW (ref 36.0–46.0)
Hemoglobin: 9.3 g/dL — ABNORMAL LOW (ref 12.0–15.0)
MCH: 30.9 pg (ref 26.0–34.0)
MCHC: 32.9 g/dL (ref 30.0–36.0)
MCV: 94 fL (ref 80.0–100.0)
Platelets: 134 10*3/uL — ABNORMAL LOW (ref 150–400)
RBC: 3.01 MIL/uL — ABNORMAL LOW (ref 3.87–5.11)
RDW: 12.8 % (ref 11.5–15.5)
WBC: 4.9 10*3/uL (ref 4.0–10.5)
nRBC: 0 % (ref 0.0–0.2)

## 2022-02-27 LAB — MAGNESIUM: Magnesium: 1.8 mg/dL (ref 1.7–2.4)

## 2022-02-27 MED ORDER — PRAVASTATIN SODIUM 10 MG PO TABS
10.0000 mg | ORAL_TABLET | Freq: Every day | ORAL | Status: DC
  Administered 2022-02-27: 10 mg via ORAL
  Filled 2022-02-27: qty 1

## 2022-02-27 MED ORDER — MEMANTINE HCL ER 28 MG PO CP24
28.0000 mg | ORAL_CAPSULE | Freq: Every morning | ORAL | Status: DC
  Administered 2022-02-27 – 2022-02-28 (×2): 28 mg via ORAL
  Filled 2022-02-27 (×2): qty 1

## 2022-02-27 MED ORDER — SODIUM CHLORIDE 0.9 % IV SOLN: INTRAVENOUS | Status: AC

## 2022-02-27 MED ORDER — BUSPIRONE HCL 5 MG PO TABS
5.0000 mg | ORAL_TABLET | Freq: Three times a day (TID) | ORAL | Status: DC
  Administered 2022-02-27 – 2022-02-28 (×5): 5 mg via ORAL
  Filled 2022-02-27 (×4): qty 1

## 2022-02-27 MED ORDER — ORAL CARE MOUTH RINSE: 15.0000 mL | OROMUCOSAL | Status: DC | PRN

## 2022-02-27 MED ORDER — DONEPEZIL HCL 10 MG PO TABS
10.0000 mg | ORAL_TABLET | Freq: Every day | ORAL | Status: DC
  Administered 2022-02-27: 10 mg via ORAL
  Filled 2022-02-27: qty 1

## 2022-02-27 NOTE — Progress Notes (Addendum)
Rounding Note    Patient Name: [name] Date of Encounter: 02/27/2022  [place] Cardiologist: Skeet Latch, MD   Subjective   Patient is oriented to self. She knows that we are in [place], but she does not know that she is in a hospital. Unable to recall why she is in the hospital. Does not know the year or president.   Denies being in any pain or discomfort. Breathing normal   Inpatient Medications    Scheduled Meds:  enoxaparin (LOVENOX) injection  30 mg Subcutaneous Daily   sodium chloride flush  3 mL Intravenous Q12H   Continuous Infusions:  PRN Meds:    Vital Signs    Vitals:   02/26/22 2028 02/27/22 0031 02/27/22 0400 02/27/22 0529  BP: 122/63 (!) 105/59  136/68  Pulse: 66 66  60  Resp: 17 16  16   Temp: 98.7 F (37.1 C) 98.9 F (37.2 C)  98.8 F (37.1 C)  TempSrc: Oral Oral  Oral  SpO2: 100% 100%    Weight:   54 kg   Height:        Intake/Output Summary (Last 24 hours) at 02/27/2022 0644 Last data filed at 02/27/2022 0022 Gross per 24 hour  Intake --  Output 350 ml  Net -350 ml      02/27/2022    4:00 AM 02/25/2022    6:15 PM 09/03/2020    8:00 PM  Last 3 Weights  Weight (lbs) 119 lb 0.8 oz 119 lb 0.8 oz 120 lb 1.6 oz  Weight (kg) 54 kg 54 kg 54.477 kg      Telemetry    Normal sinus rhythm, occasional atrial pacing - Personally Reviewed  ECG    No new tracings - Personally Reviewed  Physical Exam   GEN: Frail elderly female. Laying flat in the bed in no acute distress.  Neck: No JVD Cardiac: RRR, no murmurs, rubs, or gallops. Radial pulses 2+ bilaterally  Respiratory: Clear to auscultation bilaterally. Normal WOB on room air  GI: Soft, nontender, non-distended  MS: No edema; No deformity. Neuro:  Nonfocal. Oriented to person. Not place or time  Psych: Normal affect   Labs    High Sensitivity Troponin:   Recent Labs  Lab 02/25/22 2018 02/25/22 2058  TROPONINIHS 6 5     Chemistry Recent Labs   Lab 02/25/22 2018 02/26/22 0100 02/26/22 0527 02/27/22 0230  NA 140 142  --  137  K 4.7 4.3  --  4.9  CL 107 111  --  105  CO2 25 24  --  25  GLUCOSE 131* 90  --  175*  BUN 34* 29*  --  37*  CREATININE 1.83* 1.54*  --  2.01*  CALCIUM 9.3 8.4*  --  8.3*  MG 1.6*  --  2.4 1.8  PROT 6.5  --   --   --   ALBUMIN 3.2*  --   --   --   AST 15  --   --   --   ALT 10  --   --   --   ALKPHOS 58  --   --   --   BILITOT 0.6  --   --   --   GFRNONAA 27* 33*  --  24*  ANIONGAP 8 7  --  7    Lipids No results for input(s): "CHOL", "TRIG", "HDL", "LABVLDL", "LDLCALC", "CHOLHDL" in the last 168 hours.  Hematology Recent Labs  Lab 02/25/22  2018 02/27/22 0230  WBC 6.0 4.9  RBC 3.73* 3.01*  HGB 11.5* 9.3*  HCT 35.2* 28.3*  MCV 94.4 94.0  MCH 30.8 30.9  MCHC 32.7 32.9  RDW 12.8 12.8  PLT 137* 134*   Thyroid No results for input(s): "TSH", "FREET4" in the last 168 hours.  BNP Recent Labs  Lab 02/25/22 2018  BNP 83.6    DDimer No results for input(s): "DDIMER" in the last 168 hours.   Radiology    ECHOCARDIOGRAM COMPLETE  Result Date: 02/26/2022    ECHOCARDIOGRAM REPORT   Patient Name:   [name] Date of Exam: 02/26/2022 Medical Rec #:  [id number]      Height:       64.0 in Accession #:    [id number]     Weight:       119.0 lb Date of Birth:  1933-04-14      BSA:          1.569 m Patient Age:    88 years       BP:           150/75 mmHg Patient Gender: F              HR:           60 bpm. Exam Location:  Inpatient Procedure: 2D Echo, Cardiac Doppler and Color Doppler Indications:    Syncope  History:        Patient has prior history of Echocardiogram examinations, most                 recent 08/28/2021. CHF, Pacemaker, COPD, Signs/Symptoms:Syncope;                 Risk Factors:Diabetes and Hypertension. CKD.  Sonographer:    Clayton Lefort RDCS (AE) Referring Phys: [id number] [place]  1. Left ventricular ejection fraction, by estimation, is 65 to 70%. The left  ventricle has hyperdynamic function. The left ventricle has no regional wall motion abnormalities. There is moderate concentric left ventricular hypertrophy. Left ventricular diastolic parameters are consistent with Grade I diastolic dysfunction (impaired relaxation). There was a mild mid-cavity LV gradient, peak 17 mmHg.  2. Right ventricular systolic function is normal. The right ventricular size is normal. There is normal pulmonary artery systolic pressure. The estimated right ventricular systolic pressure is 48.8 mmHg.  3. The mitral valve is normal in structure. No evidence of mitral valve regurgitation. No evidence of mitral stenosis.  4. The aortic valve is tricuspid. There is mild calcification of the aortic valve. Aortic valve regurgitation is trivial. No aortic stenosis is present.  5. Aortic dilatation noted. There is mild dilatation of the ascending aorta, measuring 40 mm.  6. The inferior vena cava is normal in size with greater than 50% respiratory variability, suggesting right atrial pressure of 3 mmHg. FINDINGS  Left Ventricle: Left ventricular ejection fraction, by estimation, is 65 to 70%. The left ventricle has hyperdynamic function. The left ventricle has no regional wall motion abnormalities. The left ventricular internal cavity size was normal in size. There is moderate concentric left ventricular hypertrophy. Left ventricular diastolic parameters are consistent with Grade I diastolic dysfunction (impaired relaxation). Right Ventricle: The right ventricular size is normal. No increase in right ventricular wall thickness. Right ventricular systolic function is normal. There is normal pulmonary artery systolic pressure. The tricuspid regurgitant velocity is 2.49 m/s, and  with an assumed right atrial pressure of 3 mmHg, the estimated right ventricular systolic pressure is  27.8 mmHg. Left Atrium: Left atrial size was normal in size. Right Atrium: Right atrial size was normal in size. Pericardium:  There is no evidence of pericardial effusion. Mitral Valve: The mitral valve is normal in structure. No evidence of mitral valve regurgitation. No evidence of mitral valve stenosis. Tricuspid Valve: The tricuspid valve is normal in structure. Tricuspid valve regurgitation is trivial. Aortic Valve: The aortic valve is tricuspid. There is mild calcification of the aortic valve. Aortic valve regurgitation is trivial. No aortic stenosis is present. Aortic valve mean gradient measures 5.0 mmHg. Aortic valve peak gradient measures 8.1 mmHg. Aortic valve area, by VTI measures 1.94 cm. Pulmonic Valve: The pulmonic valve was normal in structure. Pulmonic valve regurgitation is not visualized. Aorta: The aortic root is normal in size and structure and aortic dilatation noted. There is mild dilatation of the ascending aorta, measuring 40 mm. Venous: The inferior vena cava is normal in size with greater than 50% respiratory variability, suggesting right atrial pressure of 3 mmHg. IAS/Shunts: No atrial level shunt detected by color flow Doppler. Additional Comments: A device lead is visualized in the right ventricle.  LEFT VENTRICLE PLAX 2D LVIDd:         3.20 cm   Diastology LVIDs:         1.90 cm   LV e' medial:    5.00 cm/s LV PW:         1.10 cm   LV E/e' medial:  15.0 LV IVS:        1.20 cm   LV e' lateral:   6.64 cm/s LVOT diam:     1.70 cm   LV E/e' lateral: 11.3 LV SV:         59 LV SV Index:   37 LVOT Area:     2.27 cm  RIGHT VENTRICLE             IVC RV Basal diam:  2.00 cm     IVC diam: 1.30 cm RV S prime:     15.00 cm/s TAPSE (M-mode): 1.3 cm LEFT ATRIUM             Index        RIGHT ATRIUM          Index LA diam:        2.90 cm 1.85 cm/m   RA Area:     8.72 cm LA Vol (A2C):   34.2 ml 21.79 ml/m  RA Volume:   14.50 ml 9.24 ml/m LA Vol (A4C):   31.2 ml 19.88 ml/m LA Biplane Vol: 34.0 ml 21.67 ml/m  AORTIC VALVE AV Area (Vmax):    1.89 cm AV Area (Vmean):   1.79 cm AV Area (VTI):     1.94 cm AV Vmax:            142.00 cm/s AV Vmean:          99.500 cm/s AV VTI:            0.303 m AV Peak Grad:      8.1 mmHg AV Mean Grad:      5.0 mmHg LVOT Vmax:         118.00 cm/s LVOT Vmean:        78.600 cm/s LVOT VTI:          0.259 m LVOT/AV VTI ratio: 0.85  AORTA Ao Root diam: 3.00 cm Ao Asc diam:  4.00 cm MITRAL VALVE  TRICUSPID VALVE MV Area (PHT): 2.31 cm    TR Peak grad:   24.8 mmHg MV Decel Time: 328 msec    TR Vmax:        249.00 cm/s MV E velocity: 75.10 cm/s MV A velocity: 89.10 cm/s  SHUNTS MV E/A ratio:  0.84        Systemic VTI:  0.26 m                            Systemic Diam: 1.70 cm Dalton McleanMD Electronically signed by Franki Monte Signature Date/Time: 02/26/2022/5:46:29 PM    Final    CUP PACEART REMOTE DEVICE CHECK  Result Date: 02/26/2022 Scheduled remote reviewed. Normal device function.  1 PMT appropriately treated and terminated Next remote 91 days. LA  DG Chest Port 1 View  Result Date: 02/25/2022 CLINICAL DATA:  Syncope EXAM: PORTABLE CHEST 1 VIEW COMPARISON:  Previous studies including the examination of 12/17/2021 FINDINGS: Cardiac size is within normal limits. Low position of diaphragms may suggest COPD. Thoracic aorta is tortuous and ectatic. Pacemaker battery is seen in the left infraclavicular region with tips of leads in right atrium and right ventricle. Lung fields are clear of any infiltrates or pulmonary edema. There is no pleural effusion or pneumothorax. Degenerative changes are noted in both shoulders. IMPRESSION: There are no signs of pulmonary edema or focal pulmonary consolidation. Electronically Signed   By: Elmer Picker M.D.   On: 02/25/2022 19:53   CT HEAD WO CONTRAST  Result Date: 02/25/2022 CLINICAL DATA:  Syncope/presyncope, cerebrovascular cause suspected. EXAM: CT HEAD WITHOUT CONTRAST TECHNIQUE: Contiguous axial images were obtained from the base of the skull through the vertex without intravenous contrast. RADIATION DOSE REDUCTION: This exam  was performed according to the departmental dose-optimization program which includes automated exposure control, adjustment of the mA and/or kV according to patient size and/or use of iterative reconstruction technique. COMPARISON:  Head CT 08/25/2021 and MRI 11/22/2017 FINDINGS: Brain: There is no evidence of an acute infarct, intracranial hemorrhage, mass, midline shift, or extra-axial fluid collection. 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 Cerebral white matter hypodensities are unchanged and nonspecific but compatible with mild chronic small vessel ischemic disease. There is mild cerebral atrophy. A partially empty sella and mild cerebellar tonsillar ectopia are again noted. Vascular: Calcified atherosclerosis at the skull base. No hyperdense vessel. Skull: No acute fracture or suspicious osseous lesion. Sinuses/Orbits: New moderate volume fluid in the included portion of the left maxillary sinus. Unchanged mild left ethmoid air cell opacification. No significant mastoid fluid. Unremarkable orbits. Other: None. IMPRESSION: 1. No evidence of acute intracranial abnormality. 2. Mild chronic small vessel ischemic disease. Unchanged chronic right cerebellar infarct. 3. New left maxillary sinus fluid. Correlate for acute sinusitis. Electronically Signed   By: Logan Bores M.D.   On: 02/25/2022 19:14    Cardiac Studies   Echocardiogram 02/26/22  1. Left ventricular ejection fraction, by estimation, is 65 to 70%. The  left ventricle has hyperdynamic function. The left ventricle has no  regional wall motion abnormalities. There is moderate concentric left  ventricular hypertrophy. Left ventricular  diastolic parameters are consistent with Grade I diastolic dysfunction  (impaired relaxation). There was a mild mid-cavity LV gradient, peak 17  mmHg.   2. Right ventricular systolic function is normal. The right ventricular  size is normal. There is normal pulmonary artery systolic pressure. The   estimated right ventricular systolic pressure is 16.1 mmHg.  3. The mitral valve is normal in structure. No evidence of mitral valve  regurgitation. No evidence of mitral stenosis.   4. The aortic valve is tricuspid. There is mild calcification of the  aortic valve. Aortic valve regurgitation is trivial. No aortic stenosis is  present.   5. Aortic dilatation noted. There is mild dilatation of the ascending  aorta, measuring 40 mm.   6. The inferior vena cava is normal in size with greater than 50%  respiratory variability, suggesting right atrial pressure of 3 mmHg.   Patient Profile     88 y.o. female  with a hx of sinus bradycardia s/p pacemaker, HTN, chronic diastolic heart failure, dementia, asthma/COPD, type 2 DM, CKD stage IIIb, s/p colostomy who is being seen for the evaluation of syncope, NSVT.   Assessment & Plan    Syncope  Bradycardia s/p PPM  - Patient has a history of symptomatic bradycardia that caused syncope/falls. Had a [employer] PPM implanted in 04/2018 - Patient has dementia and is unable to describe events that brought her to the hospital.  - hstn negative x2 - Device interrogated in the ED -- showed no abnormalities and normal device function. Given this, I do not suspect an arrhythmic cause of syncope  - Echo this admission showed EF 65-70%, no regional wall motion abnormalities, moderate LVH with grade I diastolic dysfunction,  - Consider monitor at DC - Orthostatic vital signs pending.  - Agree with PT and OT evaluation as ordered by primary team    NSVT - Reportedly, telemetry in the ED showed brief episodes of NSVT, lasting between 3-15 seconds per episode. Unfortunately, patient was taken off telemetry and moved to a hallway bed, and no rhythm strips were saved to epic  - EKGs captured during episodes of suspected NSVT showed sinus rhythm with artifact. No NSVT noted on EKG  - Now back on telemetry-- no further episodes of NSVT noted. I am not convinced that  patient ever had true NSVT, more likely there was artifact  - If patient has NSVT, can start a BB (Note HR in the 60s, but BB will not cause bradycardia as patient has a normally functioning PPM)  - Maintain K>4, mag>2 - Continue telemetry while admitted   Mild Dilation of Ascending Aorta  - Echo showed mild dilation of the ascending aorta measuring 40 mm - Plan to follow as an outpatient with echocardiogram in approx 1 year    Otherwise per primary  - CKD stage IIIb - COPD/asthma - Dementia - Hypomagnesemia  - Colostomy in situ - Type 2 DM       For questions or updates, please contact [place] Please consult [email or web address] for contact info under        Signed, Margie Billet, PA-C  02/27/2022, 6:44 AM    History and all data above reviewed.  Patient examined.  I agree with the findings as above.  Pleasantly demented.   No acute complaints. Denies pain or SOB.   The patient exam reveals COR:RRR  ,  Lungs: Clear  ,  Abd: Positive bowel sounds, no rebound no guarding, Ext no edema   .  All available labs, radiology testing, previous records reviewed. Agree with documented assessment and plan.   Syncope:  Leading diagnosis is likely volume depletion and low BP at the time of event.  Doubt arrhythmia or acute cardiac event.  No further work up suggested.  Please call with further questions.  Jeneen Rinks Ritter [place]  11:43 AM  02/27/2022

## 2022-02-27 NOTE — Progress Notes (Signed)
OT Cancellation Note  Patient Details Name: JAINE ESTABROOKS MRN: [id number] DOB: 05-06-1933   Cancelled Treatment:    Reason Eval/Treat Not Completed: OT screened, no needs identified, will sign off  Metta Clines 02/27/2022, 9:48 AM 02/27/2022  RP, OTR/L  Acute Rehabilitation Services  Office:  [phone]

## 2022-02-27 NOTE — Evaluation (Signed)
Physical Therapy Evaluation Patient Details Name: [name] MRN: [id number] DOB: 05/24/1933 Today's Date: 02/27/2022  History of Present Illness  88 yo admitted 12/13 with syncope at home. PMhx: Dementia, sinus bradycardia s/p PPM, HTN, chronic diastolic heart failure, asthma/COPD, T2DM, CKD, colostomy  Clinical Impression  Pt pleasant and able to mobilize in room, up to [facility] and walk in hall with minguard-min assist. Pt without bradycardia or symptomatic hypotension with mobility. Per chart and pt she lives at home with family with 24hr assist and walks with walker in home. Pt at baseline functional level without need for further therapy intervention at this time with continued daily mobility recommended and 24hr supervision at D/C. Will sign off.   Orthostatic BPs  Supine 148/70, HR 60  Sitting 116/69, HR 62     Standing 126/63, HR 68  Sitting after gait 119/68, HR 72          Recommendations for follow up therapy are one component of a multi-disciplinary discharge planning process, led by the attending physician.  Recommendations may be updated based on patient status, additional functional criteria and insurance authorization.  Follow Up Recommendations No PT follow up      Assistance Recommended at Discharge Frequent or constant Supervision/Assistance  Patient can return home with the following  A little help with walking and/or transfers;A little help with bathing/dressing/bathroom;Assistance with cooking/housework;Direct supervision/assist for medications management;Assist for transportation;Direct supervision/assist for financial management;Help with stairs or ramp for entrance    Equipment Recommendations None recommended by PT  Recommendations for Other Services       Functional Status Assessment Patient has not had a recent decline in their functional status     Precautions / Restrictions Precautions Precautions: Fall      Mobility  Bed Mobility Overal bed  mobility: Needs Assistance Bed Mobility: Supine to Sit     Supine to sit: Supervision     General bed mobility comments: supervision for lines and safety    Transfers Overall transfer level: Needs assistance   Transfers: Sit to/from Stand, Bed to chair/wheelchair/BSC Sit to Stand: Min guard Stand pivot transfers: Min assist         General transfer comment: min assist for balance from bed to [facility]    Ambulation/Gait Ambulation/Gait assistance: Min guard Gait Distance (Feet): 100 Feet Assistive device: Rolling walker (2 wheels) Gait Pattern/deviations: Step-through pattern, Decreased stride length, Trunk flexed   Gait velocity interpretation: 1.31 - 2.62 ft/sec, indicative of limited community ambulator   General Gait Details: cues for direction and proximity to RW, pt with kyphotic posture and maintains self posterior to [employer]            Wheelchair Mobility    Modified Rankin (Stroke Patients Only)       Balance Overall balance assessment: Needs assistance   Sitting balance-Leahy Scale: Fair     Standing balance support: Reliant on assistive device for balance, Bilateral upper extremity supported Standing balance-Leahy Scale: Poor                               Pertinent Vitals/Pain Pain Assessment Pain Assessment: No/denies pain    Home Living Family/patient expects to be discharged to:: Private residence Living Arrangements: Children Available Help at Discharge: Family;Available 24 hours/day Type of Home: House Home Access: Stairs to enter   [employer] of Steps: 3   Home Layout: One level Home Equipment: Conservation officer, nature (2 wheels);Cane -  single point Additional Comments: Pt confirming information from prior admission, no family present to confirm    Prior Function Prior Level of Function : Needs assist             Mobility Comments: Uses RW for mobility tasks ADLs Comments: Daughter assists with ADL tasks,  family does IADLs     Hand Dominance        Extremity/Trunk Assessment   Upper Extremity Assessment Upper Extremity Assessment: Generalized weakness    Lower Extremity Assessment Lower Extremity Assessment: Generalized weakness    Cervical / Trunk Assessment Cervical / Trunk Assessment: Kyphotic  Communication   Communication: No difficulties  Cognition Arousal/Alertness: Awake/alert Behavior During Therapy: WFL for tasks assessed/performed Overall Cognitive Status: No family/caregiver present to determine baseline cognitive functioning                                 General Comments: history of dementia. oriented to hospital, month and self        General Comments      Exercises     Assessment/Plan    PT Assessment Patient does not need any further PT services  PT Problem List         PT Treatment Interventions      PT Goals (Current goals can be found in the Care Plan section)  Acute Rehab PT Goals PT Goal Formulation: All assessment and education complete, DC therapy    Frequency       Co-evaluation               AM-PAC PT "6 Clicks" Mobility  Outcome Measure Help needed turning from your back to your side while in a flat bed without using bedrails?: A Little Help needed moving from lying on your back to sitting on the side of a flat bed without using bedrails?: A Little Help needed moving to and from a bed to a chair (including a wheelchair)?: A Little Help needed standing up from a chair using your arms (e.g., wheelchair or bedside chair)?: A Little Help needed to walk in hospital room?: A Little Help needed climbing 3-5 steps with a railing? : A Little 6 Click Score: 18    End of Session Equipment Utilized During Treatment: Gait belt Activity Tolerance: Patient tolerated treatment well Patient left: in chair;with call bell/phone within reach;with chair alarm set Nurse Communication: Mobility status PT Visit Diagnosis:  Other abnormalities of gait and mobility (R26.89)    Time: 4970-2637 PT Time Calculation (min) (ACUTE ONLY): 23 min   Charges:   PT Evaluation $PT Eval Moderate Complexity: 1 Mod          [place], PT Acute Rehabilitation Services Office: [place] 02/27/2022, 9:37 AM

## 2022-02-27 NOTE — Progress Notes (Signed)
PROGRESS NOTE    [name]  WUJ:[id number] DOB: 05-12-1933 DOA: 02/25/2022 PCP: Leeroy Cha, MD   Brief Narrative:  88 y.o. female with medical history significant of Dementia, sinus bradycardia s/p pacemaker, hypertension, chronic diastolic heart failure, asthma/COPD, type 2 diabetes, CKD 3B, s/p colostomy who presents with syncopal episode. Patient had a similar episode of transient altered mental status/delayed responsiveness back in 08/2021.  She had extensive workup with multiple interrogation of her pacemaker which was without overt findings.  Neurology was also consulted for possible seizure with continuous EEG which was negative despite tremors noted on video.  Echocardiogram showed grade 1 diastolic dysfunction but otherwise unremarkable.  Initially had positive orthostatics but ultimately was negative with standing.  She was discharged with further outpatient workup with PCP. In the ED, her pacemaker was interrogated without any abnormal findings.  No significant electrolyte abnormalities compared with her baseline.  Troponin of 5.  BNP of 83. CT head was negative with possible left maxillary sinusitis.  Assessment & Plan:   Principal Problem:   Syncope Active Problems:   Chronic diastolic CHF (congestive heart failure) (HCC)   Chronic kidney disease, stage 3b (HCC)   Asthma with chronic obstructive pulmonary disease (COPD)   Essential hypertension   Dementia without behavioral disturbance (HCC)   Diabetes mellitus with complication (HCC)   Colostomy present (HCC)   Hypomagnesemia  Syncope/NSVT/chronic diastolic CHF Was found to be minimally responsive with head on the kitchen table by family members.  Systolic blood pressure was noted to be in the 70s.  Was given IV fluids with improvement. Pacemaker was apparently interrogated in the ED without any abnormalities. Echocardiogram shows normal ejection fraction, no wall motion abnormality, moderate concentric  left ventricular hypertrophy and grade 1 diastolic dysfunction.  Aortic valve is tricuspid and mitral valve is normal in structure as well.  Has been ordered and is pending.  Previous echocardiogram showed only grade 1 diastolic dysfunction.  She did have NSVT.  Cardiology was consulted.  No further episodes.  Maintaining electrolytes within normal range.  She appears slightly, complains of dizziness, orthostatic positive.  Will give her some IV fluids.   AKI on Chronic kidney disease stage IIIb It looks like her baseline ranges anywhere between 1.2-2.0.  Creatinine yesterday was 1.5 and today is 2.01.  Patient also appears dry and has complaints of dizziness and is orthostatic positive.  Will provide her with IV fluids and recheck labs in the morning.    History of asthma/COPD Stable.   Essential hypertension Initially had hypotension per EMS which responded to fluid.  Not on any antihypertensives.  Blood pressures have stabilized.   History of dementia Stable.  She was alert and oriented to self as well as place today.  No complaints.  Resuming Aricept and Namenda.   Hypomagnesemia Corrected   Colostomy in situ Patient does not remember.  The reason for this.  Placed several years ago.   Diabetes mellitus type 2 with renal complications, chronic kidney disease stage IIIb Does not seem to be on any medications.  Blood sugar fairly controlled.  DVT prophylaxis: enoxaparin (LOVENOX) injection 30 mg Start: 02/26/22 1000   Code Status: Full Code  Family Communication:  None present at bedside.  Plan of care discussed with patient in length and he/she verbalized understanding and agreed with it.  Status is: Observation The patient will require care spanning > 2 midnights and should be moved to inpatient because: Still positive orthostatic dizziness.   Estimated body mass  index is 20.43 kg/m as calculated from the following:   Height as of this encounter: 5\' 4"  (1.626 m).   Weight as of  this encounter: 54 kg.    Nutritional Assessment: Body mass index is 20.43 kg/m.Marland Kitchen Seen by dietician.  I agree with the assessment and plan as outlined below: Nutrition Status:        . Skin Assessment: I have examined the patient's skin and I agree with the wound assessment as performed by the wound care RN as outlined below:    Consultants:  Cardiology  Procedures:  None  Antimicrobials:  Anti-infectives (From admission, onward)    None         Subjective: Patient seen and examined.  She is complaining of dizziness.  Objective: Vitals:   02/27/22 0400 02/27/22 0529 02/27/22 0752 02/27/22 0931  BP:  136/68 132/68   Pulse:  60 60   Resp:  16 18   Temp:  98.8 F (37.1 C) 98.4 F (36.9 C)   TempSrc:  Oral Oral   SpO2:   100% 98%  Weight: 54 kg     Height:        Intake/Output Summary (Last 24 hours) at 02/27/2022 1058 Last data filed at 02/27/2022 0022 Gross per 24 hour  Intake --  Output 350 ml  Net -350 ml   Filed Weights   02/25/22 1815 02/27/22 0400  Weight: 54 kg 54 kg    Examination:  General exam: Appears calm and comfortable  Respiratory system: Clear to auscultation. Respiratory effort normal. Cardiovascular system: S1 & S2 heard, RRR. No JVD, murmurs, rubs, gallops or clicks. No pedal edema. Gastrointestinal system: Abdomen is nondistended, soft and nontender. No organomegaly or masses felt. Normal bowel sounds heard. Central nervous system: Alert and oriented x 2. No focal neurological deficits. Extremities: Symmetric 5 x 5 power. Skin: No rashes, lesions or ulcers   Data Reviewed: I have personally reviewed following labs and imaging studies  CBC: Recent Labs  Lab 02/25/22 2018 02/27/22 0230  WBC 6.0 4.9  NEUTROABS 5.2  --   HGB 11.5* 9.3*  HCT 35.2* 28.3*  MCV 94.4 94.0  PLT 137* 161*   Basic Metabolic Panel: Recent Labs  Lab 02/25/22 2018 02/26/22 0100 02/26/22 0527 02/27/22 0230  NA 140 142  --  137  K 4.7 4.3   --  4.9  CL 107 111  --  105  CO2 25 24  --  25  GLUCOSE 131* 90  --  175*  BUN 34* 29*  --  37*  CREATININE 1.83* 1.54*  --  2.01*  CALCIUM 9.3 8.4*  --  8.3*  MG 1.6*  --  2.4 1.8   GFR: Estimated Creatinine Clearance: 17.1 mL/min (A) (by C-G formula based on SCr of 2.01 mg/dL (H)). Liver Function Tests: Recent Labs  Lab 02/25/22 2018  AST 15  ALT 10  ALKPHOS 58  BILITOT 0.6  PROT 6.5  ALBUMIN 3.2*   No results for input(s): "LIPASE", "AMYLASE" in the last 168 hours. No results for input(s): "AMMONIA" in the last 168 hours. Coagulation Profile: Recent Labs  Lab 02/25/22 2118  INR 1.0   Cardiac Enzymes: No results for input(s): "CKTOTAL", "CKMB", "CKMBINDEX", "TROPONINI" in the last 168 hours. BNP (last 3 results) No results for input(s): "PROBNP" in the last 8760 hours. HbA1C: No results for input(s): "HGBA1C" in the last 72 hours. CBG: Recent Labs  Lab 02/25/22 1831 02/26/22 0434 02/26/22 0507 02/26/22 0741  GLUCAP  137* 63* 130* 185*   Lipid Profile: No results for input(s): "CHOL", "HDL", "LDLCALC", "TRIG", "CHOLHDL", "LDLDIRECT" in the last 72 hours. Thyroid Function Tests: No results for input(s): "TSH", "T4TOTAL", "FREET4", "T3FREE", "THYROIDAB" in the last 72 hours. Anemia Panel: No results for input(s): "VITAMINB12", "FOLATE", "FERRITIN", "TIBC", "IRON", "RETICCTPCT" in the last 72 hours. Sepsis Labs: No results for input(s): "PROCALCITON", "LATICACIDVEN" in the last 168 hours.  Recent Results (from the past 240 hour(s))  Resp panel by RT-PCR (RSV, Flu A&B, Covid) Anterior Nasal Swab     Status: None   Collection Time: 02/25/22 10:43 PM   Specimen: Anterior Nasal Swab  Result Value Ref Range Status   SARS Coronavirus 2 by RT PCR NEGATIVE NEGATIVE Final    Comment: (NOTE) SARS-CoV-2 target nucleic acids are NOT DETECTED.  The SARS-CoV-2 RNA is generally detectable in upper respiratory specimens during the acute phase of infection. The  lowest concentration of SARS-CoV-2 viral copies this assay can detect is 138 copies/mL. A negative result does not preclude SARS-Cov-2 infection and should not be used as the sole basis for treatment or other patient management decisions. A negative result may occur with  improper specimen collection/handling, submission of specimen other than nasopharyngeal swab, presence of viral mutation(s) within the areas targeted by this assay, and inadequate number of viral copies(<138 copies/mL). A negative result must be combined with clinical observations, patient history, and epidemiological information. The expected result is Negative.  Fact Sheet for Patients:  EntrepreneurPulse.com.au  Fact Sheet for Healthcare Providers:  IncredibleEmployment.be  This test is no t yet approved or cleared by the [place] FDA and  has been authorized for detection and/or diagnosis of SARS-CoV-2 by FDA under an Emergency Use Authorization (EUA). This EUA will remain  in effect (meaning this test can be used) for the duration of the COVID-19 declaration under Section 564(b)(1) of the Act, 21 U.S.C.section 360bbb-3(b)(1), unless the authorization is terminated  or revoked sooner.       Influenza A by PCR NEGATIVE NEGATIVE Final   Influenza B by PCR NEGATIVE NEGATIVE Final    Comment: (NOTE) The Xpert Xpress SARS-CoV-2/FLU/RSV plus assay is intended as an aid in the diagnosis of influenza from Nasopharyngeal swab specimens and should not be used as a sole basis for treatment. Nasal washings and aspirates are unacceptable for Xpert Xpress SARS-CoV-2/FLU/RSV testing.  Fact Sheet for Patients: EntrepreneurPulse.com.au  Fact Sheet for Healthcare Providers: IncredibleEmployment.be  This test is not yet approved or cleared by the [place] FDA and has been authorized for detection and/or diagnosis of SARS-CoV-2 by FDA under  an Emergency Use Authorization (EUA). This EUA will remain in effect (meaning this test can be used) for the duration of the COVID-19 declaration under Section 564(b)(1) of the Act, 21 U.S.C. section 360bbb-3(b)(1), unless the authorization is terminated or revoked.     Resp Syncytial Virus by PCR NEGATIVE NEGATIVE Final    Comment: (NOTE) Fact Sheet for Patients: EntrepreneurPulse.com.au  Fact Sheet for Healthcare Providers: IncredibleEmployment.be  This test is not yet approved or cleared by the [place] FDA and has been authorized for detection and/or diagnosis of SARS-CoV-2 by FDA under an Emergency Use Authorization (EUA). This EUA will remain in effect (meaning this test can be used) for the duration of the COVID-19 declaration under Section 564(b)(1) of the Act, 21 U.S.C. section 360bbb-3(b)(1), unless the authorization is terminated or revoked.  Performed at [facility], [place] [address] [place], [place] [address]  Radiology Studies: ECHOCARDIOGRAM COMPLETE  Result Date: 02/26/2022    ECHOCARDIOGRAM REPORT   Patient Name:   STEFANIE HODGENS Date of Exam: 02/26/2022 Medical Rec #:  [id number]      Height:       64.0 in Accession #:    [id number]     Weight:       119.0 lb Date of Birth:  [birth year]      BSA:          1.569 m Patient Age:    40 years       BP:           150/75 mmHg Patient Gender: F              HR:           60 bpm. Exam Location:  Inpatient Procedure: 2D Echo, Cardiac Doppler and Color Doppler Indications:    Syncope  History:        Patient has prior history of Echocardiogram examinations, most                 recent 08/28/2021. CHF, Pacemaker, COPD, Signs/Symptoms:Syncope;                 Risk Factors:Diabetes and Hypertension. CKD.  Sonographer:    Clayton Lefort RDCS (AE) Referring Phys: [id number] [place]  1. Left ventricular ejection fraction, by estimation, is 65 to 70%. The left ventricle  has hyperdynamic function. The left ventricle has no regional wall motion abnormalities. There is moderate concentric left ventricular hypertrophy. Left ventricular diastolic parameters are consistent with Grade I diastolic dysfunction (impaired relaxation). There was a mild mid-cavity LV gradient, peak 17 mmHg.  2. Right ventricular systolic function is normal. The right ventricular size is normal. There is normal pulmonary artery systolic pressure. The estimated right ventricular systolic pressure is 63.8 mmHg.  3. The mitral valve is normal in structure. No evidence of mitral valve regurgitation. No evidence of mitral stenosis.  4. The aortic valve is tricuspid. There is mild calcification of the aortic valve. Aortic valve regurgitation is trivial. No aortic stenosis is present.  5. Aortic dilatation noted. There is mild dilatation of the ascending aorta, measuring 40 mm.  6. The inferior vena cava is normal in size with greater than 50% respiratory variability, suggesting right atrial pressure of 3 mmHg. FINDINGS  Left Ventricle: Left ventricular ejection fraction, by estimation, is 65 to 70%. The left ventricle has hyperdynamic function. The left ventricle has no regional wall motion abnormalities. The left ventricular internal cavity size was normal in size. There is moderate concentric left ventricular hypertrophy. Left ventricular diastolic parameters are consistent with Grade I diastolic dysfunction (impaired relaxation). Right Ventricle: The right ventricular size is normal. No increase in right ventricular wall thickness. Right ventricular systolic function is normal. There is normal pulmonary artery systolic pressure. The tricuspid regurgitant velocity is 2.49 m/s, and  with an assumed right atrial pressure of 3 mmHg, the estimated right ventricular systolic pressure is 45.3 mmHg. Left Atrium: Left atrial size was normal in size. Right Atrium: Right atrial size was normal in size. Pericardium: There is  no evidence of pericardial effusion. Mitral Valve: The mitral valve is normal in structure. No evidence of mitral valve regurgitation. No evidence of mitral valve stenosis. Tricuspid Valve: The tricuspid valve is normal in structure. Tricuspid valve regurgitation is trivial. Aortic Valve: The aortic valve is tricuspid. There is mild calcification of the aortic valve. Aortic valve regurgitation  is trivial. No aortic stenosis is present. Aortic valve mean gradient measures 5.0 mmHg. Aortic valve peak gradient measures 8.1 mmHg. Aortic valve area, by VTI measures 1.94 cm. Pulmonic Valve: The pulmonic valve was normal in structure. Pulmonic valve regurgitation is not visualized. Aorta: The aortic root is normal in size and structure and aortic dilatation noted. There is mild dilatation of the ascending aorta, measuring 40 mm. Venous: The inferior vena cava is normal in size with greater than 50% respiratory variability, suggesting right atrial pressure of 3 mmHg. IAS/Shunts: No atrial level shunt detected by color flow Doppler. Additional Comments: A device lead is visualized in the right ventricle.  LEFT VENTRICLE PLAX 2D LVIDd:         3.20 cm   Diastology LVIDs:         1.90 cm   LV e' medial:    5.00 cm/s LV PW:         1.10 cm   LV E/e' medial:  15.0 LV IVS:        1.20 cm   LV e' lateral:   6.64 cm/s LVOT diam:     1.70 cm   LV E/e' lateral: 11.3 LV SV:         59 LV SV Index:   37 LVOT Area:     2.27 cm  RIGHT VENTRICLE             IVC RV Basal diam:  2.00 cm     IVC diam: 1.30 cm RV S prime:     15.00 cm/s TAPSE (M-mode): 1.3 cm LEFT ATRIUM             Index        RIGHT ATRIUM          Index LA diam:        2.90 cm 1.85 cm/m   RA Area:     8.72 cm LA Vol (A2C):   34.2 ml 21.79 ml/m  RA Volume:   14.50 ml 9.24 ml/m LA Vol (A4C):   31.2 ml 19.88 ml/m LA Biplane Vol: 34.0 ml 21.67 ml/m  AORTIC VALVE AV Area (Vmax):    1.89 cm AV Area (Vmean):   1.79 cm AV Area (VTI):     1.94 cm AV Vmax:            142.00 cm/s AV Vmean:          99.500 cm/s AV VTI:            0.303 m AV Peak Grad:      8.1 mmHg AV Mean Grad:      5.0 mmHg LVOT Vmax:         118.00 cm/s LVOT Vmean:        78.600 cm/s LVOT VTI:          0.259 m LVOT/AV VTI ratio: 0.85  AORTA Ao Root diam: 3.00 cm Ao Asc diam:  4.00 cm MITRAL VALVE               TRICUSPID VALVE MV Area (PHT): 2.31 cm    TR Peak grad:   24.8 mmHg MV Decel Time: 328 msec    TR Vmax:        249.00 cm/s MV E velocity: 75.10 cm/s MV A velocity: 89.10 cm/s  SHUNTS MV E/A ratio:  0.84        Systemic VTI:  0.26 m  Systemic Diam: 1.70 cm Dalton McleanMD Electronically signed by Franki Monte Signature Date/Time: 02/26/2022/5:46:29 PM    Final    CUP PACEART REMOTE DEVICE CHECK  Result Date: 02/26/2022 Scheduled remote reviewed. Normal device function.  1 PMT appropriately treated and terminated Next remote 91 days. LA  DG Chest Port 1 View  Result Date: 02/25/2022 CLINICAL DATA:  Syncope EXAM: PORTABLE CHEST 1 VIEW COMPARISON:  Previous studies including the examination of 12/17/2021 FINDINGS: Cardiac size is within normal limits. Low position of diaphragms may suggest COPD. Thoracic aorta is tortuous and ectatic. Pacemaker battery is seen in the left infraclavicular region with tips of leads in right atrium and right ventricle. Lung fields are clear of any infiltrates or pulmonary edema. There is no pleural effusion or pneumothorax. Degenerative changes are noted in both shoulders. IMPRESSION: There are no signs of pulmonary edema or focal pulmonary consolidation. Electronically Signed   By: Elmer Picker M.D.   On: 02/25/2022 19:53   CT HEAD WO CONTRAST  Result Date: 02/25/2022 CLINICAL DATA:  Syncope/presyncope, cerebrovascular cause suspected. EXAM: CT HEAD WITHOUT CONTRAST TECHNIQUE: Contiguous axial images were obtained from the base of the skull through the vertex without intravenous contrast. RADIATION DOSE REDUCTION: This exam  was performed according to the departmental dose-optimization program which includes automated exposure control, adjustment of the mA and/or kV according to patient size and/or use of iterative reconstruction technique. COMPARISON:  Head CT 08/25/2021 and MRI 11/22/2017 FINDINGS: Brain: There is no evidence of an acute infarct, intracranial hemorrhage, mass, midline shift, or extra-axial fluid collection. 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 Cerebral white matter hypodensities are unchanged and nonspecific but compatible with mild chronic small vessel ischemic disease. There is mild cerebral atrophy. A partially empty sella and mild cerebellar tonsillar ectopia are again noted. Vascular: Calcified atherosclerosis at the skull base. No hyperdense vessel. Skull: No acute fracture or suspicious osseous lesion. Sinuses/Orbits: New moderate volume fluid in the included portion of the left maxillary sinus. Unchanged mild left ethmoid air cell opacification. No significant mastoid fluid. Unremarkable orbits. Other: None. IMPRESSION: 1. No evidence of acute intracranial abnormality. 2. Mild chronic small vessel ischemic disease. Unchanged chronic right cerebellar infarct. 3. New left maxillary sinus fluid. Correlate for acute sinusitis. Electronically Signed   By: Logan Bores M.D.   On: 02/25/2022 19:14    Scheduled Meds:  enoxaparin (LOVENOX) injection  30 mg Subcutaneous Daily   sodium chloride flush  3 mL Intravenous Q12H   Continuous Infusions:  sodium chloride 250 mL/hr at 02/27/22 0830     LOS: 0 days   Darliss Cheney, MD [facility]  02/27/2022, 10:58 AM   *Please note that this is a verbal dictation therefore any spelling or grammatical errors are due to the "[place]" system interpretation.  Please page via [place] and do not message via secure chat for urgent patient care matters. Secure chat can be used for non urgent patient care matters.  How to contact the [facility]  Attending or Consulting provider [place] or covering provider during after hours [place], for this patient?  Check the care team in [facility] and look for a) attending/consulting [facility] provider listed and b) the [facility] team listed. Page or secure chat [phone]. Log into [email or web address] and use [place]'s universal password to access. If you do not have the password, please contact the hospital operator. Locate the [facility] provider you are looking for under [facility] and page to a number that you can  be directly reached. If you still have difficulty reaching the provider, please page the [facility] (Director on Call) for the Hospitalists listed on amion for assistance.

## 2022-02-28 LAB — BASIC METABOLIC PANEL
Anion gap: 5 (ref 5–15)
BUN: 40 mg/dL — ABNORMAL HIGH (ref 8–23)
CO2: 25 mmol/L (ref 22–32)
Calcium: 8.6 mg/dL — ABNORMAL LOW (ref 8.9–10.3)
Chloride: 110 mmol/L (ref 98–111)
Creatinine, Ser: 1.58 mg/dL — ABNORMAL HIGH (ref 0.44–1.00)
GFR, Estimated: 32 mL/min — ABNORMAL LOW (ref >60)
Glucose, Bld: 137 mg/dL — ABNORMAL HIGH (ref 70–99)
Potassium: 4.7 mmol/L (ref 3.5–5.1)
Sodium: 140 mmol/L (ref 135–145)

## 2022-02-28 LAB — GLUCOSE, CAPILLARY: Glucose-Capillary: 112 mg/dL — ABNORMAL HIGH (ref 70–99)

## 2022-02-28 NOTE — Discharge Summary (Signed)
Physician Discharge Summary  [name]:[id number] DOB: 12-15-1933 DOA: 02/25/2022  PCP: Leeroy Cha, MD  Admit date: 02/25/2022 Discharge date: 02/28/2022 30 Day Unplanned Readmission Risk Score    Flowsheet Row ED to Hosp-Admission (Current) from 02/25/2022 in [facility]  30 Day Unplanned Readmission Risk Score (%) 22.93 Filed at 02/28/2022 1200       This score is the patient's risk of an unplanned readmission within 30 days of being discharged (0 -100%). The score is based on dignosis, age, lab data, medications, orders, and past utilization.   Low:  0-14.9   Medium: 15-21.9   High: 22-29.9   Extreme: 30 and above          Admitted From: Home Disposition: Home  Recommendations for Outpatient Follow-up:  Follow up with PCP in 1-2 weeks Please obtain BMP/CBC in one week Please follow up with your PCP on the following pending results: Unresulted Labs (From admission, onward)    None         Home Health: None Equipment/Devices: None  Discharge Condition: Stable CODE STATUS: Full code Diet recommendation: Cardiac  Subjective: Patient seen and examined.  She feels well.  She has no complaints.  No dizziness.  Brief/Interim Summary: 88 y.o. female with medical history significant of Dementia, sinus bradycardia s/p pacemaker, hypertension, chronic diastolic heart failure, asthma/COPD, type 2 diabetes, CKD 3B, s/p colostomy who presented with syncopal episode. Patient had a similar episode of transient altered mental status/delayed responsiveness back in 08/2021.  She had extensive workup with multiple interrogation of her pacemaker which was without overt findings.  Neurology was also consulted for possible seizure with continuous EEG which was negative despite tremors noted on video.  Echocardiogram showed grade 1 diastolic dysfunction but otherwise unremarkable.  Initially had positive orthostatics but ultimately was negative with  standing.  She was discharged with further outpatient workup with PCP. In the ED, her pacemaker was interrogated without any abnormal findings.  No significant electrolyte abnormalities compared with her baseline.  Troponin of 5.  BNP of 83. CT head was negative with possible left maxillary sinusitis. Echocardiogram shows normal ejection fraction, no wall motion abnormality, moderate concentric left ventricular hypertrophy and grade 1 diastolic dysfunction.  Aortic valve is tricuspid and mitral valve is normal in structure as well.  Has been ordered and is pending.  Previous echocardiogram showed only grade 1 diastolic dysfunction.  She did have NSVT.  Cardiology was consulted.  No further episodes.  Cardiology ruled out any arrhythmia or any cardiac etiology of her syncope.  She was found to be orthostatic positive and AKI, indicating possible dehydration causing orthostatic hypotension leading to syncope episode.  She was hydrated and she is feeling better.  She is not orthostatic positive anymore.  She has been discharged in stable condition.  She was also assessed by PT OT and no further PT OT was recommended.  I have also discussed her discharge with her granddaughter.  TOC is working on arranging transportation for her as her granddaughter does not drive herself.   AKI on Chronic kidney disease stage IIIb It looks like her baseline ranges anywhere between 1.2-2.0.  Her creatinine jumped to 2.0 but then improved to 1.5 after hydration today.   History of asthma/COPD Stable.   Essential hypertension Initially had hypotension per EMS which responded to fluid.  Not on any antihypertensives.  Blood pressures have stabilized.   History of dementia Stable.  She was alert and oriented to self as  well as place today.  No complaints.  Resuming Aricept and Namenda.   Hypomagnesemia Corrected   Colostomy in situ Patient does not remember.  The reason for this.  Placed several years ago.   Diabetes  mellitus type 2 with renal complications, chronic kidney disease stage IIIb Does not seem to be on any medications.  Blood sugar fairly controlled  Discharge plan was discussed with patient and/or family member and they verbalized understanding and agreed with it.  Discharge Diagnoses:  Principal Problem:   Syncope and collapse Active Problems:   Chronic diastolic CHF (congestive heart failure) (HCC)   Chronic kidney disease, stage 3b (HCC)   Asthma with chronic obstructive pulmonary disease (COPD)   Essential hypertension   Dementia without behavioral disturbance (HCC)   Diabetes mellitus with complication (HCC)   Colostomy present ([place])   Hypomagnesemia   Syncope    Discharge Instructions   Allergies as of 02/28/2022       Reactions   Aspirin Other (See Comments)   Triggers asthma        Medication List     STOP taking these medications    albuterol 108 (90 Base) MCG/ACT inhaler Commonly known as: VENTOLIN HFA   ondansetron 4 MG disintegrating tablet Commonly known as: Zofran ODT       TAKE these medications    busPIRone 5 MG tablet Commonly known as: BUSPAR Take 1 tablet (5 mg total) by mouth 3 (three) times daily.   donepezil 10 MG tablet Commonly known as: ARICEPT Take 1 tablet (10 mg total) by mouth at bedtime.   memantine 28 MG Cp24 24 hr capsule Commonly known as: NAMENDA XR Take 1 capsule (28 mg total) by mouth every morning.   pravastatin 10 MG tablet Commonly known as: PRAVACHOL Take 10 mg by mouth at bedtime.        Follow-up Information     Leeroy Cha, MD Follow up in 1 week(s).   Specialty: Internal Medicine Contact information: [address] [place] [address] [phone]                Allergies  Allergen Reactions   Aspirin Other (See Comments)    Triggers asthma    Consultations: Cardiology   Procedures/Studies: ECHOCARDIOGRAM COMPLETE  Result Date: 02/26/2022     ECHOCARDIOGRAM REPORT   Patient Name:   TOMEIKA WEINMANN Date of Exam: 02/26/2022 Medical Rec #:  [id number]      Height:       64.0 in Accession #:    [id number]     Weight:       119.0 lb Date of Birth:  June 03, 1933      BSA:          1.569 m Patient Age:    88 years       BP:           150/75 mmHg Patient Gender: F              HR:           60 bpm. Exam Location:  Inpatient Procedure: 2D Echo, Cardiac Doppler and Color Doppler Indications:    Syncope  History:        Patient has prior history of Echocardiogram examinations, most                 recent 08/28/2021. CHF, Pacemaker, COPD, Signs/Symptoms:Syncope;  Risk Factors:Diabetes and Hypertension. CKD.  Sonographer:    Clayton Lefort RDCS (AE) Referring Phys: [id number] Lily  1. Left ventricular ejection fraction, by estimation, is 65 to 70%. The left ventricle has hyperdynamic function. The left ventricle has no regional wall motion abnormalities. There is moderate concentric left ventricular hypertrophy. Left ventricular diastolic parameters are consistent with Grade I diastolic dysfunction (impaired relaxation). There was a mild mid-cavity LV gradient, peak 17 mmHg.  2. Right ventricular systolic function is normal. The right ventricular size is normal. There is normal pulmonary artery systolic pressure. The estimated right ventricular systolic pressure is 18.8 mmHg.  3. The mitral valve is normal in structure. No evidence of mitral valve regurgitation. No evidence of mitral stenosis.  4. The aortic valve is tricuspid. There is mild calcification of the aortic valve. Aortic valve regurgitation is trivial. No aortic stenosis is present.  5. Aortic dilatation noted. There is mild dilatation of the ascending aorta, measuring 40 mm.  6. The inferior vena cava is normal in size with greater than 50% respiratory variability, suggesting right atrial pressure of 3 mmHg. FINDINGS  Left Ventricle: Left ventricular ejection fraction, by  estimation, is 65 to 70%. The left ventricle has hyperdynamic function. The left ventricle has no regional wall motion abnormalities. The left ventricular internal cavity size was normal in size. There is moderate concentric left ventricular hypertrophy. Left ventricular diastolic parameters are consistent with Grade I diastolic dysfunction (impaired relaxation). Right Ventricle: The right ventricular size is normal. No increase in right ventricular wall thickness. Right ventricular systolic function is normal. There is normal pulmonary artery systolic pressure. The tricuspid regurgitant velocity is 2.49 m/s, and  with an assumed right atrial pressure of 3 mmHg, the estimated right ventricular systolic pressure is 41.6 mmHg. Left Atrium: Left atrial size was normal in size. Right Atrium: Right atrial size was normal in size. Pericardium: There is no evidence of pericardial effusion. Mitral Valve: The mitral valve is normal in structure. No evidence of mitral valve regurgitation. No evidence of mitral valve stenosis. Tricuspid Valve: The tricuspid valve is normal in structure. Tricuspid valve regurgitation is trivial. Aortic Valve: The aortic valve is tricuspid. There is mild calcification of the aortic valve. Aortic valve regurgitation is trivial. No aortic stenosis is present. Aortic valve mean gradient measures 5.0 mmHg. Aortic valve peak gradient measures 8.1 mmHg. Aortic valve area, by VTI measures 1.94 cm. Pulmonic Valve: The pulmonic valve was normal in structure. Pulmonic valve regurgitation is not visualized. Aorta: The aortic root is normal in size and structure and aortic dilatation noted. There is mild dilatation of the ascending aorta, measuring 40 mm. Venous: The inferior vena cava is normal in size with greater than 50% respiratory variability, suggesting right atrial pressure of 3 mmHg. IAS/Shunts: No atrial level shunt detected by color flow Doppler. Additional Comments: A device lead is visualized  in the right ventricle.  LEFT VENTRICLE PLAX 2D LVIDd:         3.20 cm   Diastology LVIDs:         1.90 cm   LV e' medial:    5.00 cm/s LV PW:         1.10 cm   LV E/e' medial:  15.0 LV IVS:        1.20 cm   LV e' lateral:   6.64 cm/s LVOT diam:     1.70 cm   LV E/e' lateral: 11.3 LV SV:  43 LV SV Index:   37 LVOT Area:     2.27 cm  RIGHT VENTRICLE             IVC RV Basal diam:  2.00 cm     IVC diam: 1.30 cm RV S prime:     15.00 cm/s TAPSE (M-mode): 1.3 cm LEFT ATRIUM             Index        RIGHT ATRIUM          Index LA diam:        2.90 cm 1.85 cm/m   RA Area:     8.72 cm LA Vol (A2C):   34.2 ml 21.79 ml/m  RA Volume:   14.50 ml 9.24 ml/m LA Vol (A4C):   31.2 ml 19.88 ml/m LA Biplane Vol: 34.0 ml 21.67 ml/m  AORTIC VALVE AV Area (Vmax):    1.89 cm AV Area (Vmean):   1.79 cm AV Area (VTI):     1.94 cm AV Vmax:           142.00 cm/s AV Vmean:          99.500 cm/s AV VTI:            0.303 m AV Peak Grad:      8.1 mmHg AV Mean Grad:      5.0 mmHg LVOT Vmax:         118.00 cm/s LVOT Vmean:        78.600 cm/s LVOT VTI:          0.259 m LVOT/AV VTI ratio: 0.85  AORTA Ao Root diam: 3.00 cm Ao Asc diam:  4.00 cm MITRAL VALVE               TRICUSPID VALVE MV Area (PHT): 2.31 cm    TR Peak grad:   24.8 mmHg MV Decel Time: 328 msec    TR Vmax:        249.00 cm/s MV E velocity: 75.10 cm/s MV A velocity: 89.10 cm/s  SHUNTS MV E/A ratio:  0.84        Systemic VTI:  0.26 m                            Systemic Diam: 1.70 cm Dalton McleanMD Electronically signed by Franki Monte Signature Date/Time: 02/26/2022/5:46:29 PM    Final    CUP PACEART REMOTE DEVICE CHECK  Result Date: 02/26/2022 Scheduled remote reviewed. Normal device function.  1 PMT appropriately treated and terminated Next remote 91 days. LA  DG Chest Port 1 View  Result Date: 02/25/2022 CLINICAL DATA:  Syncope EXAM: PORTABLE CHEST 1 VIEW COMPARISON:  Previous studies including the examination of 12/17/2021 FINDINGS: Cardiac size is  within normal limits. Low position of diaphragms may suggest COPD. Thoracic aorta is tortuous and ectatic. Pacemaker battery is seen in the left infraclavicular region with tips of leads in right atrium and right ventricle. Lung fields are clear of any infiltrates or pulmonary edema. There is no pleural effusion or pneumothorax. Degenerative changes are noted in both shoulders. IMPRESSION: There are no signs of pulmonary edema or focal pulmonary consolidation. Electronically Signed   By: Elmer Picker M.D.   On: 02/25/2022 19:53   CT HEAD WO CONTRAST  Result Date: 02/25/2022 CLINICAL DATA:  Syncope/presyncope, cerebrovascular cause suspected. EXAM: CT HEAD WITHOUT CONTRAST TECHNIQUE: Contiguous axial images were obtained from the base of the skull  through the vertex without intravenous contrast. RADIATION DOSE REDUCTION: This exam was performed according to the departmental dose-optimization program which includes automated exposure control, adjustment of the mA and/or kV according to patient size and/or use of iterative reconstruction technique. COMPARISON:  Head CT 08/25/2021 and MRI 11/22/2017 FINDINGS: Brain: There is no evidence of an acute infarct, intracranial hemorrhage, mass, midline shift, or extra-axial fluid collection. 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 Cerebral white matter hypodensities are unchanged and nonspecific but compatible with mild chronic small vessel ischemic disease. There is mild cerebral atrophy. A partially empty sella and mild cerebellar tonsillar ectopia are again noted. Vascular: Calcified atherosclerosis at the skull base. No hyperdense vessel. Skull: No acute fracture or suspicious osseous lesion. Sinuses/Orbits: New moderate volume fluid in the included portion of the left maxillary sinus. Unchanged mild left ethmoid air cell opacification. No significant mastoid fluid. Unremarkable orbits. Other: None. IMPRESSION: 1. No evidence of acute intracranial  abnormality. 2. Mild chronic small vessel ischemic disease. Unchanged chronic right cerebellar infarct. 3. New left maxillary sinus fluid. Correlate for acute sinusitis. Electronically Signed   By: Logan Bores M.D.   On: 02/25/2022 19:14     Discharge Exam: Vitals:   02/28/22 0743 02/28/22 1121  BP: (!) 149/70 136/68  Pulse: 60   Resp: 16 18  Temp: 98.6 F (37 C) 98.6 F (37 C)  SpO2: 99%    Vitals:   02/28/22 0000 02/28/22 0444 02/28/22 0743 02/28/22 1121  BP: 121/64 (!) 140/81 (!) 149/70 136/68  Pulse:  60 60   Resp:  16 16 18   Temp: 98.6 F (37 C) 98.3 F (36.8 C) 98.6 F (37 C) 98.6 F (37 C)  TempSrc:  Oral Oral Oral  SpO2:   99%   Weight:  54 kg    Height:        General: Pt is alert, awake, not in acute distress Cardiovascular: RRR, S1/S2 +, no rubs, no gallops Respiratory: CTA bilaterally, no wheezing, no rhonchi Abdominal: Soft, NT, ND, bowel sounds + Extremities: no edema, no cyanosis    The results of significant diagnostics from this hospitalization (including imaging, microbiology, ancillary and laboratory) are listed below for reference.     Microbiology: Recent Results (from the past 240 hour(s))  Resp panel by RT-PCR (RSV, Flu A&B, Covid) Anterior Nasal Swab     Status: None   Collection Time: 02/25/22 10:43 PM   Specimen: Anterior Nasal Swab  Result Value Ref Range Status   SARS Coronavirus 2 by RT PCR NEGATIVE NEGATIVE Final    Comment: (NOTE) SARS-CoV-2 target nucleic acids are NOT DETECTED.  The SARS-CoV-2 RNA is generally detectable in upper respiratory specimens during the acute phase of infection. The lowest concentration of SARS-CoV-2 viral copies this assay can detect is 138 copies/mL. A negative result does not preclude SARS-Cov-2 infection and should not be used as the sole basis for treatment or other patient management decisions. A negative result may occur with  improper specimen collection/handling, submission of specimen  other than nasopharyngeal swab, presence of viral mutation(s) within the areas targeted by this assay, and inadequate number of viral copies(<138 copies/mL). A negative result must be combined with clinical observations, patient history, and epidemiological information. The expected result is Negative.  Fact Sheet for Patients:  EntrepreneurPulse.com.au  Fact Sheet for Healthcare Providers:  IncredibleEmployment.be  This test is no t yet approved or cleared by the [place] FDA and  has been authorized for detection and/or diagnosis of SARS-CoV-2  by FDA under an Emergency Use Authorization (EUA). This EUA will remain  in effect (meaning this test can be used) for the duration of the COVID-19 declaration under Section 564(b)(1) of the Act, 21 U.S.C.section 360bbb-3(b)(1), unless the authorization is terminated  or revoked sooner.       Influenza A by PCR NEGATIVE NEGATIVE Final   Influenza B by PCR NEGATIVE NEGATIVE Final    Comment: (NOTE) The Xpert Xpress SARS-CoV-2/FLU/RSV plus assay is intended as an aid in the diagnosis of influenza from Nasopharyngeal swab specimens and should not be used as a sole basis for treatment. Nasal washings and aspirates are unacceptable for Xpert Xpress SARS-CoV-2/FLU/RSV testing.  Fact Sheet for Patients: EntrepreneurPulse.com.au  Fact Sheet for Healthcare Providers: IncredibleEmployment.be  This test is not yet approved or cleared by the [place] FDA and has been authorized for detection and/or diagnosis of SARS-CoV-2 by FDA under an Emergency Use Authorization (EUA). This EUA will remain in effect (meaning this test can be used) for the duration of the COVID-19 declaration under Section 564(b)(1) of the Act, 21 U.S.C. section 360bbb-3(b)(1), unless the authorization is terminated or revoked.     Resp Syncytial Virus by PCR NEGATIVE NEGATIVE Final     Comment: (NOTE) Fact Sheet for Patients: EntrepreneurPulse.com.au  Fact Sheet for Healthcare Providers: IncredibleEmployment.be  This test is not yet approved or cleared by the [place] FDA and has been authorized for detection and/or diagnosis of SARS-CoV-2 by FDA under an Emergency Use Authorization (EUA). This EUA will remain in effect (meaning this test can be used) for the duration of the COVID-19 declaration under Section 564(b)(1) of the Act, 21 U.S.C. section 360bbb-3(b)(1), unless the authorization is terminated or revoked.  Performed at [facility], [place] [address], [place], [place] [address]      Labs: BNP (last 3 results) Recent Labs    02/25/22 2018  BNP 60.4   Basic Metabolic Panel: Recent Labs  Lab 02/25/22 2018 02/26/22 0100 02/26/22 0527 02/27/22 0230 02/27/22 1348 02/28/22 0148  NA 140 142  --  137 137 140  K 4.7 4.3  --  4.9 4.4 4.7  CL 107 111  --  105 105 110  CO2 25 24  --  25 26 25   GLUCOSE 131* 90  --  175* 106* 137*  BUN 34* 29*  --  37* 29* 40*  CREATININE 1.83* 1.54*  --  2.01* 1.67* 1.58*  CALCIUM 9.3 8.4*  --  8.3* 8.1* 8.6*  MG 1.6*  --  2.4 1.8  --   --    Liver Function Tests: Recent Labs  Lab 02/25/22 2018  AST 15  ALT 10  ALKPHOS 58  BILITOT 0.6  PROT 6.5  ALBUMIN 3.2*   No results for input(s): "LIPASE", "AMYLASE" in the last 168 hours. No results for input(s): "AMMONIA" in the last 168 hours. CBC: Recent Labs  Lab 02/25/22 2018 02/27/22 0230  WBC 6.0 4.9  NEUTROABS 5.2  --   HGB 11.5* 9.3*  HCT 35.2* 28.3*  MCV 94.4 94.0  PLT 137* 134*   Cardiac Enzymes: No results for input(s): "CKTOTAL", "CKMB", "CKMBINDEX", "TROPONINI" in the last 168 hours. BNP: Invalid input(s): "POCBNP" CBG: Recent Labs  Lab 02/25/22 1831 02/26/22 0434 02/26/22 0507 02/26/22 0741 02/28/22 0645  GLUCAP 137* 63* 130* 185* 112*   D-Dimer No results for input(s): "DDIMER" in the  last 72 hours. Hgb A1c No results for input(s): "HGBA1C" in the last 72 hours. Lipid  Profile No results for input(s): "CHOL", "HDL", "LDLCALC", "TRIG", "CHOLHDL", "LDLDIRECT" in the last 72 hours. Thyroid function studies No results for input(s): "TSH", "T4TOTAL", "T3FREE", "THYROIDAB" in the last 72 hours.  Invalid input(s): "FREET3" Anemia work up No results for input(s): "VITAMINB12", "FOLATE", "FERRITIN", "TIBC", "IRON", "RETICCTPCT" in the last 72 hours. Urinalysis    Component Value Date/Time   COLORURINE YELLOW 02/26/2022 1310   APPEARANCEUR CLEAR 02/26/2022 1310   LABSPEC 1.013 02/26/2022 1310   PHURINE 5.0 02/26/2022 1310   GLUCOSEU NEGATIVE 02/26/2022 1310   HGBUR NEGATIVE 02/26/2022 1310   BILIRUBINUR NEGATIVE 02/26/2022 1310   KETONESUR NEGATIVE 02/26/2022 1310   PROTEINUR NEGATIVE 02/26/2022 1310   UROBILINOGEN 1.0 01/09/2015 1620   NITRITE NEGATIVE 02/26/2022 1310   LEUKOCYTESUR TRACE (A) 02/26/2022 1310   Sepsis Labs Recent Labs  Lab 02/25/22 2018 02/27/22 0230  WBC 6.0 4.9   Microbiology Recent Results (from the past 240 hour(s))  Resp panel by RT-PCR (RSV, Flu A&B, Covid) Anterior Nasal Swab     Status: None   Collection Time: 02/25/22 10:43 PM   Specimen: Anterior Nasal Swab  Result Value Ref Range Status   SARS Coronavirus 2 by RT PCR NEGATIVE NEGATIVE Final    Comment: (NOTE) SARS-CoV-2 target nucleic acids are NOT DETECTED.  The SARS-CoV-2 RNA is generally detectable in upper respiratory specimens during the acute phase of infection. The lowest concentration of SARS-CoV-2 viral copies this assay can detect is 138 copies/mL. A negative result does not preclude SARS-Cov-2 infection and should not be used as the sole basis for treatment or other patient management decisions. A negative result may occur with  improper specimen collection/handling, submission of specimen other than nasopharyngeal swab, presence of viral mutation(s) within the areas  targeted by this assay, and inadequate number of viral copies(<138 copies/mL). A negative result must be combined with clinical observations, patient history, and epidemiological information. The expected result is Negative.  Fact Sheet for Patients:  EntrepreneurPulse.com.au  Fact Sheet for Healthcare Providers:  IncredibleEmployment.be  This test is no t yet approved or cleared by the [place] FDA and  has been authorized for detection and/or diagnosis of SARS-CoV-2 by FDA under an Emergency Use Authorization (EUA). This EUA will remain  in effect (meaning this test can be used) for the duration of the COVID-19 declaration under Section 564(b)(1) of the Act, 21 U.S.C.section 360bbb-3(b)(1), unless the authorization is terminated  or revoked sooner.       Influenza A by PCR NEGATIVE NEGATIVE Final   Influenza B by PCR NEGATIVE NEGATIVE Final    Comment: (NOTE) The Xpert Xpress SARS-CoV-2/FLU/RSV plus assay is intended as an aid in the diagnosis of influenza from Nasopharyngeal swab specimens and should not be used as a sole basis for treatment. Nasal washings and aspirates are unacceptable for Xpert Xpress SARS-CoV-2/FLU/RSV testing.  Fact Sheet for Patients: EntrepreneurPulse.com.au  Fact Sheet for Healthcare Providers: IncredibleEmployment.be  This test is not yet approved or cleared by the [place] FDA and has been authorized for detection and/or diagnosis of SARS-CoV-2 by FDA under an Emergency Use Authorization (EUA). This EUA will remain in effect (meaning this test can be used) for the duration of the COVID-19 declaration under Section 564(b)(1) of the Act, 21 U.S.C. section 360bbb-3(b)(1), unless the authorization is terminated or revoked.     Resp Syncytial Virus by PCR NEGATIVE NEGATIVE Final    Comment: (NOTE) Fact Sheet for  Patients: EntrepreneurPulse.com.au  Fact Sheet for Healthcare Providers: IncredibleEmployment.be  This test  is not yet approved or cleared by the [place] and has been authorized for detection and/or diagnosis of SARS-CoV-2 by FDA under an Emergency Use Authorization (EUA). This EUA will remain in effect (meaning this test can be used) for the duration of the COVID-19 declaration under Section 564(b)(1) of the Act, 21 U.S.C. section 360bbb-3(b)(1), unless the authorization is terminated or revoked.  Performed at [facility], [place] [address] [place], [place] [address]      Time coordinating discharge: Over 30 minutes  SIGNED:   Darliss Cheney, MD  [facility] 02/28/2022, 1:25 PM *Please note that this is a verbal dictation therefore any spelling or grammatical errors are due to the "[employer]" system interpretation. If 7PM-7AM, please contact night-coverage [email or web address]

## 2022-02-28 NOTE — Progress Notes (Signed)
PT Cancellation Note  Patient Details Name: CYNTIA STALEY MRN: [id number] DOB: May 18, 1933   Cancelled Treatment:    Reason Eval/Treat Not Completed: PT screened, no needs identified, will sign off (pt evaluated and D/C 12/15 will not reconsult at this time, please reorder if pt status changes, thanks)   Jakia Kennebrew B Dacota Devall 02/28/2022, 8:14 AM [place] Office: [phone]

## 2022-02-28 NOTE — Progress Notes (Signed)
Reviewed AVS information with patients granddaughter over the phone. Questions answered and [name] verbalized good understanding of all information. Plan to call [name] when ambulance arrives so she will be ready when they get to her house. [name] agrees.  AVS sent with patient home and copy in packet for PTAR

## 2022-02-28 NOTE — TOC Transition Note (Signed)
Transition of Care [facility]) - CM/SW Discharge Note   Patient Details  Name: [name] MRN: [id number] Date of Birth: 02-02-1934  [facility] CM/SW Contact:  Bartholomew Crews, RN Phone Number: [phone] 02/28/2022, 1:33 PM   Clinical Narrative:     Spoke with patient's granddaughter, [name], on the phone to discuss post acute transition. [name] stated that a neighbor typically helps with transportation, but is not available today. [name] stated that usually ambulance works better. Confirmed home address. PTAR arranged. Medical necessity completed and printed to nursing station. No further TOC needs identified at this time.   Final next level of care: Home/Self Care Barriers to Discharge: No Barriers Identified   Patient Goals and CMS Choice Patient states their goals for this hospitalization and ongoing recovery are:: home with granddaughter CMS Medicare.gov Compare Post Acute Care list provided to:: Patient Represenative (must comment) ([name]) Choice offered to / list presented to : NA  Discharge Placement                       Discharge Plan and Services                DME Arranged: N/A DME Agency: NA       HH Arranged: NA HH Agency: NA        Social Determinants of Health (SDOH) Interventions     Readmission Risk Interventions     No data to display

## 2022-03-06 ENCOUNTER — Emergency Department (HOSPITAL_COMMUNITY)
Admission: EM | Admit: 2022-03-06 | Discharge: 2022-03-07 | Disposition: A | Discharge status: Home / Self Care | Payer: Medicare Other | Attending: Emergency Medicine | Admitting: Emergency Medicine

## 2022-03-06 ENCOUNTER — Encounter (HOSPITAL_COMMUNITY): Payer: Self-pay | Admitting: Emergency Medicine

## 2022-03-06 ENCOUNTER — Emergency Department (HOSPITAL_COMMUNITY): Payer: Medicare Other

## 2022-03-06 ENCOUNTER — Other Ambulatory Visit: Payer: Self-pay

## 2022-03-06 DIAGNOSIS — N183 Chronic kidney disease, stage 3 unspecified: Secondary | ICD-10-CM | POA: Insufficient documentation

## 2022-03-06 DIAGNOSIS — E860 Dehydration: Secondary | ICD-10-CM | POA: Diagnosis not present

## 2022-03-06 DIAGNOSIS — E1122 Type 2 diabetes mellitus with diabetic chronic kidney disease: Secondary | ICD-10-CM | POA: Insufficient documentation

## 2022-03-06 DIAGNOSIS — R55 Syncope and collapse: Secondary | ICD-10-CM | POA: Diagnosis not present

## 2022-03-06 DIAGNOSIS — Z950 Presence of cardiac pacemaker: Secondary | ICD-10-CM | POA: Diagnosis not present

## 2022-03-06 DIAGNOSIS — R531 Weakness: Secondary | ICD-10-CM | POA: Insufficient documentation

## 2022-03-06 DIAGNOSIS — J45909 Unspecified asthma, uncomplicated: Secondary | ICD-10-CM | POA: Insufficient documentation

## 2022-03-06 DIAGNOSIS — I129 Hypertensive chronic kidney disease with stage 1 through stage 4 chronic kidney disease, or unspecified chronic kidney disease: Secondary | ICD-10-CM | POA: Insufficient documentation

## 2022-03-06 DIAGNOSIS — F0390 Unspecified dementia without behavioral disturbance: Secondary | ICD-10-CM | POA: Insufficient documentation

## 2022-03-06 DIAGNOSIS — R410 Disorientation, unspecified: Secondary | ICD-10-CM | POA: Diagnosis not present

## 2022-03-06 DIAGNOSIS — Z743 Need for continuous supervision: Secondary | ICD-10-CM | POA: Diagnosis not present

## 2022-03-06 DIAGNOSIS — Z87891 Personal history of nicotine dependence: Secondary | ICD-10-CM | POA: Insufficient documentation

## 2022-03-06 DIAGNOSIS — R404 Transient alteration of awareness: Secondary | ICD-10-CM | POA: Diagnosis not present

## 2022-03-06 LAB — CBC WITH DIFFERENTIAL/PLATELET
Abs Immature Granulocytes: 0.02 10*3/uL (ref 0.00–0.07)
Basophils Absolute: 0 10*3/uL (ref 0.0–0.1)
Basophils Relative: 1 %
Eosinophils Absolute: 0.1 10*3/uL (ref 0.0–0.5)
Eosinophils Relative: 1 %
HCT: 33.7 % — ABNORMAL LOW (ref 36.0–46.0)
Hemoglobin: 10.7 g/dL — ABNORMAL LOW (ref 12.0–15.0)
Immature Granulocytes: 0 %
Lymphocytes Relative: 14 %
Lymphs Abs: 0.9 10*3/uL (ref 0.7–4.0)
MCH: 31.4 pg (ref 26.0–34.0)
MCHC: 31.8 g/dL (ref 30.0–36.0)
MCV: 98.8 fL (ref 80.0–100.0)
Monocytes Absolute: 0.3 10*3/uL (ref 0.1–1.0)
Monocytes Relative: 5 %
Neutro Abs: 4.8 10*3/uL (ref 1.7–7.7)
Neutrophils Relative %: 79 %
Platelets: 180 10*3/uL (ref 150–400)
RBC: 3.41 MIL/uL — ABNORMAL LOW (ref 3.87–5.11)
RDW: 12.9 % (ref 11.5–15.5)
WBC: 6.1 10*3/uL (ref 4.0–10.5)
nRBC: 0 % (ref 0.0–0.2)

## 2022-03-06 LAB — BASIC METABOLIC PANEL
Anion gap: 9 (ref 5–15)
BUN: 31 mg/dL — ABNORMAL HIGH (ref 8–23)
CO2: 24 mmol/L (ref 22–32)
Calcium: 8.9 mg/dL (ref 8.9–10.3)
Chloride: 105 mmol/L (ref 98–111)
Creatinine, Ser: 1.94 mg/dL — ABNORMAL HIGH (ref 0.44–1.00)
GFR, Estimated: 25 mL/min — ABNORMAL LOW (ref >60)
Glucose, Bld: 194 mg/dL — ABNORMAL HIGH (ref 70–99)
Potassium: 4.1 mmol/L (ref 3.5–5.1)
Sodium: 138 mmol/L (ref 135–145)

## 2022-03-06 LAB — URINALYSIS, ROUTINE W REFLEX MICROSCOPIC
Bilirubin Urine: NEGATIVE
Glucose, UA: NEGATIVE mg/dL
Ketones, ur: NEGATIVE mg/dL
Leukocytes,Ua: NEGATIVE
Nitrite: NEGATIVE
Protein, ur: NEGATIVE mg/dL
Specific Gravity, Urine: 1.008 (ref 1.005–1.030)
pH: 5 (ref 5.0–8.0)

## 2022-03-06 LAB — CBG MONITORING, ED: Glucose-Capillary: 204 mg/dL — ABNORMAL HIGH (ref 70–99)

## 2022-03-06 MED ORDER — LACTATED RINGERS IV BOLUS
1000.0000 mL | Freq: Once | INTRAVENOUS | Status: AC
  Administered 2022-03-06: 1000 mL via INTRAVENOUS

## 2022-03-06 NOTE — ED Notes (Signed)
Granddaughter [name] [phone] or [phone] would like an update asap

## 2022-03-06 NOTE — ED Triage Notes (Signed)
Pt BIB EMS from home, pt was sitting at kitchen table when she had a syncopal episode. Denies falling. Pt lethargic upon EMS arrival, A&Ox3. Seen 1 week ago for same.   EMS vitals: 102/60 HR 60 Paced RR 16 98% RA CBG 156

## 2022-03-06 NOTE — ED Provider Notes (Signed)
[name] EMERGENCY DEPARTMENT Provider Note  CSN: [id number] Arrival date & time: 03/06/22 1800  Chief Complaint(s) Loss of Consciousness  HPI [name] is a 88 y.o. female with history of CKD, dementia, diabetes presenting to the emergency department with apparent syncope.  Per EMS, the patient was sitting at the kitchen table when she apparently had a syncopal episode.  Did not fall.  Patient recently admitted for similar episode.  History limited as patient is demented.  Patient is not sure why she is in the emergency department but denies any complaints and reports she feels well, no headaches, nausea, vomiting, chest pain, abdominal pain or any other symptoms.   Discussed with patient's granddaughter Tamica through to collateral history.  She reports that the patient was in bed all day, not eating or drinking.  She reports that the patient spends a lot of time sitting in bed and this is typical for her.  She denies any other new symptoms such as fevers or cough.  She reports that she brought her down for dinner and the patient said "I do not feel good ".  She then seemed to slump over a few times.  She reports that this is similar to what happened when she was admitted to the hospital previously.  Past Medical History Past Medical History:  Diagnosis Date   Asthma    CKD (chronic kidney disease) stage 3, GFR 30-59 ml/min ([place]) 05/01/2018   Colostomy present ([place])    hx/notes 07/17/2010   Dementia ([place])    Diabetic neuropathy ([place])    Hypertension    hx/notes 07/17/2010   Pneumonia    recent/notes 10/08/2016   Symptomatic bradycardia 12/15/2017   Type II diabetes mellitus ([place])    hx/notes 07/17/2010   Patient Active Problem List   Diagnosis Date Noted   Syncope 02/27/2022   Hypomagnesemia 02/26/2022   Altered sensorium 08/29/2021   Chronic diastolic CHF (congestive heart failure) ([place]) 08/28/2021   Episode of altered consciousness 08/27/2021   Malnutrition  of moderate degree 08/30/2020   Protein-calorie malnutrition, severe 08/30/2020   Dehydration 08/29/2020   Hyperkalemia 08/29/2020   Macrocytic anemia 08/29/2020   COVID-19 virus infection 04/07/2020   Pacemaker 12/21/2019   UTI (urinary tract infection) 05/11/2019   ARF (acute renal failure) ([place]) 05/10/2019   Acute lower UTI 05/10/2019   Symptomatic bradycardia 05/01/2018   Near syncope 05/01/2018   Chronic kidney disease, stage 3b ([place]) 05/01/2018   Influenza B 04/10/2018   Generalized weakness 04/10/2018   Essential hypertension 03/01/2018   Asthma with chronic obstructive pulmonary disease (COPD) 03/01/2018   Bradycardia 12/15/2017   AKI (acute kidney injury) ([place]) 07/18/2017   Colostomy present ([place]) 11/08/2016   Syncope and collapse 10/08/2016   Abnormal chest x-ray 10/08/2016   Lethargy 10/08/2016   Dementia without behavioral disturbance ([place])    Sepsis ([place]) 09/05/2016   Community acquired pneumonia 09/05/2016   Acute encephalopathy 09/05/2016   Diabetes mellitus with complication ([place]) 38/12/1749   Nausea & vomiting 04/09/2012   Gallstones 03/15/2011   Partial SBO 03/15/2011   Colostomy hernia ([place]) 03/15/2011   Hypercalcemia 02/28/2007   IRON DEFIC ANEMIA [place] DIET IRON INTAKE 02/28/2007   DISCITIS 02/28/2007   OSTEOMYELITIS 02/28/2007   CHILLS WITHOUT FEVER 02/28/2007   Home Medication(s) Prior to Admission medications   Medication Sig Start Date End Date Taking? Authorizing Provider  busPIRone (BUSPAR) 5 MG tablet Take 1 tablet (5 mg total) by mouth 3 (three) times daily. 04/29/20  Medina-Vargas, Monina C, NP  donepezil (ARICEPT) 10 MG tablet Take 1 tablet (10 mg total) by mouth at bedtime. 09/05/20   Nita Sells, MD  memantine (NAMENDA XR) 28 MG CP24 24 hr capsule Take 1 capsule (28 mg total) by mouth every morning. 09/05/20   Nita Sells, MD  pravastatin (PRAVACHOL) 10 MG tablet Take 10 mg by mouth at bedtime. 08/04/21   [provider]                                                                                                                                    Past Surgical History Past Surgical History:  Procedure Laterality Date   ABDOMINAL HYSTERECTOMY     COLOSTOMY     S/P SBO hx/notes 07/17/2010   LUMBAR FUSION  11/2006   hx/notes 07/17/2010   NASAL SINUS SURGERY  04/28/2002   Bilateral inferior turbinate reductions, bilateral maxillary antrotomies, bilateral total ethmoidectomies, bilateral frontal recess explorations, bilateral sphenoidotomies, Instatrac guidance./notes 07/28/2010   PACEMAKER IMPLANT N/A 05/02/2018   Procedure: PACEMAKER IMPLANT;  Surgeon: Evans Lance, MD;  Location: [facility];  Service: Cardiovascular;  Laterality: N/A;   Family History Family History  Problem Relation Age of Onset   Kidney disease Mother    Hypertension Mother    Other Father        gangrene with amputation   Diabetes Sister    Cancer Brother    Heart disease Brother    Schizophrenia Daughter    Diabetes Daughter    Stroke Daughter    Diabetes Sister    Breast cancer Neg Hx     Social History Social History   Tobacco Use   Smoking status: Never   Smokeless tobacco: Former    Types: Chew    Quit date: 02/27/2017  Vaping Use   Vaping Use: Never used  Substance Use Topics   Alcohol use: No   Drug use: No   Allergies Aspirin  Review of Systems Review of Systems  Physical Exam Vital Signs  I have reviewed the triage vital signs BP 126/66   Pulse 60   Temp 97.9 F (36.6 C) (Oral)   Resp 14   Ht 5\' 4"  (1.626 m)   Wt 54 kg   SpO2 100%   BMI 20.43 kg/m  Physical Exam Vitals and nursing note reviewed.  Constitutional:      General: She is not in acute distress.    Appearance: She is well-developed.  HENT:     Head: Normocephalic and atraumatic.     Mouth/Throat:     Mouth: Mucous membranes are moist.  Eyes:     Pupils: Pupils are equal, round, and reactive to light.   Cardiovascular:     Rate and Rhythm: Normal rate and regular rhythm.     Heart sounds: No murmur heard. Pulmonary:     Effort: Pulmonary effort is normal. No respiratory distress.  Breath sounds: Normal breath sounds.  Abdominal:     General: Abdomen is flat.     Palpations: Abdomen is soft.     Tenderness: There is no abdominal tenderness.  Musculoskeletal:        General: No tenderness.     Right lower leg: No edema.     Left lower leg: No edema.  Skin:    General: Skin is warm and dry.  Neurological:     General: No focal deficit present.     Mental Status: She is alert. Mental status is at baseline.     Comments: Oriented to place and person, not to year or situation  Psychiatric:        Mood and Affect: Mood normal.        Behavior: Behavior normal.     ED Results and Treatments Labs (all labs ordered are listed, but only abnormal results are displayed) Labs Reviewed  BASIC METABOLIC PANEL - Abnormal; Notable for the following components:      Result Value   Glucose, Bld 194 (*)    BUN 31 (*)    Creatinine, Ser 1.94 (*)    GFR, Estimated 25 (*)    All other components within normal limits  CBC WITH DIFFERENTIAL/PLATELET - Abnormal; Notable for the following components:   RBC 3.41 (*)    Hemoglobin 10.7 (*)    HCT 33.7 (*)    All other components within normal limits  URINALYSIS, ROUTINE W REFLEX MICROSCOPIC - Abnormal; Notable for the following components:   Color, Urine STRAW (*)    Hgb urine dipstick SMALL (*)    Bacteria, UA RARE (*)    All other components within normal limits  CBG MONITORING, ED - Abnormal; Notable for the following components:   Glucose-Capillary 204 (*)    All other components within normal limits                                                                                                                          Radiology CT Head Wo Contrast  Result Date: 03/06/2022 CLINICAL DATA:  Delirium, confusion.  Syncopal episode.  EXAM: CT HEAD WITHOUT CONTRAST TECHNIQUE: Contiguous axial images were obtained from the base of the skull through the vertex without intravenous contrast. RADIATION DOSE REDUCTION: This exam was performed according to the departmental dose-optimization program which includes automated exposure control, adjustment of the mA and/or kV according to patient size and/or use of iterative reconstruction technique. COMPARISON:  02/25/2022. FINDINGS: Brain: No acute intracranial hemorrhage, midline shift or mass effect. No extra-axial fluid collection. Diffuse atrophy is noted. Subcortical and periventricular white matter hypodensities are present bilaterally. No hydrocephalus. There is an old infarct in the right cerebral hemisphere. There is a partially empty sella. Vascular: No hyperdense vessel or unexpected calcification. Skull: Normal. Negative for fracture or focal lesion. Sinuses/Orbits: Partial opacification of the ethmoid air cells bilaterally. Mucosal thickening in the frontal sinuses and left frontal sinus. No acute orbital  abnormality. Other: None. IMPRESSION: 1. No acute intracranial process. 2. Atrophy with chronic microvascular ischemic changes and old right cerebellar infarct. Electronically Signed   By: Brett Fairy M.D.   On: 03/06/2022 20:05    Pertinent labs & imaging results that were available during my care of the patient were reviewed by me and considered in my medical decision making (see MDM for details).  Medications Ordered in ED Medications  lactated ringers bolus 1,000 mL (0 mLs Intravenous Stopped 03/06/22 2139)                                                                                                                                     Procedures Procedures  (including critical care time)  Medical Decision Making / ED Course   MDM:  88 year old female presenting to the emergency department with near syncope.  Patient well-appearing, physical exam reassuring.  Vital  signs reassuring.  Suspect primary cause of symptoms is dehydration or poor p.o. intake.  Laboratory testing notable for mild AKI on CKD.  Patient was recently admitted for similar symptoms symptoms and had extensive workup including echocardiogram, cardiology consult without obvious cause of patient's symptoms.  She was ultimately felt to be dehydrated as her symptoms improved with fluid.  Currently the patient is very well-appearing with no obvious focal findings on exam.  Workup here with head CT which is unchanged.  Pacemaker was interrogated without any new events.  No obvious infectious symptoms and urinalysis without evidence of infectious process.  No vital sign abnormalities or focal exam findings to suggest other occult process.  Patient has had no episodes while in the emergency department.  She received a liter of fluid.  She denies any complaints, and daughter denies any collateral history or recent historical events, to suggest other occult process such as AAA, PE, seizure.  Given recent hospitalization for similar symptoms, doubt that inpatient hospitalization would be beneficial at this time.  Will recommend very close follow-up with primary physician.  Clinical Course as of 03/06/22 2248  Fri Mar 06, 2022  2229 Pacemaker interrogated.  No arrhythmias or abnormal events. [WS]    Clinical Course User Index [WS] Cristie Hem, MD     Additional history obtained: -Additional history obtained from family and ems -External records from outside source obtained and reviewed including: Chart review including previous notes, labs, imaging, consultation notes including DC summary from recent hospitalization for the same symptoms.    Lab Tests: -I ordered, reviewed, and interpreted labs.   The pertinent results include:   Labs Reviewed  BASIC METABOLIC PANEL - Abnormal; Notable for the following components:      Result Value   Glucose, Bld 194 (*)    BUN 31 (*)    Creatinine, Ser  1.94 (*)    GFR, Estimated 25 (*)    All other components within normal limits  CBC WITH DIFFERENTIAL/PLATELET - Abnormal; Notable for  the following components:   RBC 3.41 (*)    Hemoglobin 10.7 (*)    HCT 33.7 (*)    All other components within normal limits  URINALYSIS, ROUTINE W REFLEX MICROSCOPIC - Abnormal; Notable for the following components:   Color, Urine STRAW (*)    Hgb urine dipstick SMALL (*)    Bacteria, UA RARE (*)    All other components within normal limits  CBG MONITORING, ED - Abnormal; Notable for the following components:   Glucose-Capillary 204 (*)    All other components within normal limits    Notable for mild AKI  EKG   EKG Interpretation  Date/Time:  Friday March 06 2022 18:36:35 EST Ventricular Rate:  60 PR Interval:  182 QRS Duration: 111 QT Interval:  469 QTC Calculation: 469 R Axis:   -5 Text Interpretation: Atrial-paced rhythm Incomplete left bundle branch block Low voltage, precordial leads Probable anterolateral infarct, old Artifact in lead(s) I II aVR aVL aVF V1 V2 V3 V4 V5 V6 Confirmed by Garnette Gunner [phone]) on 03/06/2022 7:59:34 PM         Imaging Studies ordered: I ordered imaging studies including CT head On my interpretation imaging demonstrates no acute process I independently visualized and interpreted imaging. I agree with the radiologist interpretation   Medicines ordered and prescription drug management: Meds ordered this encounter  Medications   lactated ringers bolus 1,000 mL    -I have reviewed the patients home medicines and have made adjustments as needed   Cardiac Monitoring: The patient was maintained on a cardiac monitor.  I personally viewed and interpreted the cardiac monitored which showed an underlying rhythm of: A paced rhythm  Social Determinants of Health:  Diagnosis or treatment significantly limited by social determinants of health: dementia   Reevaluation: After the interventions noted  above, I reevaluated the patient and found that they have improved  Co morbidities that complicate the patient evaluation  Past Medical History:  Diagnosis Date   Asthma    CKD (chronic kidney disease) stage 3, GFR 30-59 ml/min ([place]) 05/01/2018   Colostomy present ([place])    hx/notes 07/17/2010   Dementia ([place])    Diabetic neuropathy ([place])    Hypertension    hx/notes 07/17/2010   Pneumonia    recent/notes 10/08/2016   Symptomatic bradycardia 12/15/2017   Type II diabetes mellitus ([place])    hx/notes 07/17/2010      Dispostion: Disposition decision including need for hospitalization was considered, and patient discharged from emergency department.    Final Clinical Impression(s) / ED Diagnoses Final diagnoses:  Dehydration  Weakness     This chart was dictated using voice recognition software.  Despite best efforts to proofread,  errors can occur which can change the documentation meaning.    Cristie Hem, MD 03/06/22 2248

## 2022-03-06 NOTE — ED Notes (Signed)
Attempted to contact granddaughter x2. No answer at this time.

## 2022-03-07 DIAGNOSIS — Z743 Need for continuous supervision: Secondary | ICD-10-CM | POA: Diagnosis not present

## 2022-03-07 DIAGNOSIS — Z7401 Bed confinement status: Secondary | ICD-10-CM | POA: Diagnosis not present

## 2022-03-07 DIAGNOSIS — E860 Dehydration: Secondary | ICD-10-CM | POA: Diagnosis not present

## 2022-03-07 NOTE — ED Notes (Signed)
Pts granddaughter [name] called for update, this RN informed her that pt is up for discharge and can be picked up. Granddaughter does not drive and is unable to pick up pt, pt to be transported home via PTAR due to AMS/ risk of injury.

## 2022-03-07 NOTE — ED Notes (Signed)
RN reviewed discharge instructions with PTAR. VSS upon discharge. 

## 2022-03-12 ENCOUNTER — Emergency Department (HOSPITAL_COMMUNITY): Payer: Medicare Other

## 2022-03-12 ENCOUNTER — Encounter (HOSPITAL_COMMUNITY): Payer: Self-pay | Admitting: *Deleted

## 2022-03-12 ENCOUNTER — Other Ambulatory Visit: Payer: Self-pay

## 2022-03-12 ENCOUNTER — Emergency Department (HOSPITAL_COMMUNITY)
Admission: EM | Admit: 2022-03-12 | Discharge: 2022-03-13 | Disposition: A | Discharge status: Home / Self Care | Payer: Medicare Other | Attending: Emergency Medicine | Admitting: Emergency Medicine

## 2022-03-12 DIAGNOSIS — Z79899 Other long term (current) drug therapy: Secondary | ICD-10-CM | POA: Diagnosis not present

## 2022-03-12 DIAGNOSIS — I5032 Chronic diastolic (congestive) heart failure: Secondary | ICD-10-CM | POA: Diagnosis not present

## 2022-03-12 DIAGNOSIS — I130 Hypertensive heart and chronic kidney disease with heart failure and stage 1 through stage 4 chronic kidney disease, or unspecified chronic kidney disease: Secondary | ICD-10-CM | POA: Insufficient documentation

## 2022-03-12 DIAGNOSIS — J45909 Unspecified asthma, uncomplicated: Secondary | ICD-10-CM | POA: Insufficient documentation

## 2022-03-12 DIAGNOSIS — S0990XA Unspecified injury of head, initial encounter: Secondary | ICD-10-CM | POA: Diagnosis not present

## 2022-03-12 DIAGNOSIS — J90 Pleural effusion, not elsewhere classified: Secondary | ICD-10-CM | POA: Diagnosis not present

## 2022-03-12 DIAGNOSIS — J439 Emphysema, unspecified: Secondary | ICD-10-CM | POA: Diagnosis not present

## 2022-03-12 DIAGNOSIS — R55 Syncope and collapse: Secondary | ICD-10-CM | POA: Diagnosis not present

## 2022-03-12 DIAGNOSIS — R404 Transient alteration of awareness: Secondary | ICD-10-CM | POA: Diagnosis not present

## 2022-03-12 DIAGNOSIS — R6889 Other general symptoms and signs: Secondary | ICD-10-CM | POA: Diagnosis not present

## 2022-03-12 DIAGNOSIS — R7989 Other specified abnormal findings of blood chemistry: Secondary | ICD-10-CM | POA: Diagnosis not present

## 2022-03-12 DIAGNOSIS — Z950 Presence of cardiac pacemaker: Secondary | ICD-10-CM | POA: Diagnosis not present

## 2022-03-12 DIAGNOSIS — J449 Chronic obstructive pulmonary disease, unspecified: Secondary | ICD-10-CM | POA: Diagnosis not present

## 2022-03-12 DIAGNOSIS — N179 Acute kidney failure, unspecified: Secondary | ICD-10-CM | POA: Diagnosis not present

## 2022-03-12 DIAGNOSIS — E1122 Type 2 diabetes mellitus with diabetic chronic kidney disease: Secondary | ICD-10-CM | POA: Diagnosis not present

## 2022-03-12 DIAGNOSIS — G319 Degenerative disease of nervous system, unspecified: Secondary | ICD-10-CM | POA: Diagnosis not present

## 2022-03-12 DIAGNOSIS — Z8673 Personal history of transient ischemic attack (TIA), and cerebral infarction without residual deficits: Secondary | ICD-10-CM | POA: Diagnosis not present

## 2022-03-12 DIAGNOSIS — Z743 Need for continuous supervision: Secondary | ICD-10-CM | POA: Diagnosis not present

## 2022-03-12 DIAGNOSIS — N183 Chronic kidney disease, stage 3 unspecified: Secondary | ICD-10-CM | POA: Diagnosis not present

## 2022-03-12 DIAGNOSIS — E860 Dehydration: Secondary | ICD-10-CM | POA: Diagnosis not present

## 2022-03-12 DIAGNOSIS — I739 Peripheral vascular disease, unspecified: Secondary | ICD-10-CM | POA: Diagnosis not present

## 2022-03-12 LAB — COMPREHENSIVE METABOLIC PANEL
ALT: 10 U/L (ref 0–44)
AST: 17 U/L (ref 15–41)
Albumin: 2.8 g/dL — ABNORMAL LOW (ref 3.5–5.0)
Alkaline Phosphatase: 45 U/L (ref 38–126)
Anion gap: 11 (ref 5–15)
BUN: 35 mg/dL — ABNORMAL HIGH (ref 8–23)
CO2: 24 mmol/L (ref 22–32)
Calcium: 9.2 mg/dL (ref 8.9–10.3)
Chloride: 105 mmol/L (ref 98–111)
Creatinine, Ser: 1.8 mg/dL — ABNORMAL HIGH (ref 0.44–1.00)
GFR, Estimated: 27 mL/min — ABNORMAL LOW (ref >60)
Glucose, Bld: 208 mg/dL — ABNORMAL HIGH (ref 70–99)
Potassium: 3.9 mmol/L (ref 3.5–5.1)
Sodium: 140 mmol/L (ref 135–145)
Total Bilirubin: 0.4 mg/dL (ref 0.3–1.2)
Total Protein: 5.6 g/dL — ABNORMAL LOW (ref 6.5–8.1)

## 2022-03-12 LAB — CBC
HCT: 31.3 % — ABNORMAL LOW (ref 36.0–46.0)
Hemoglobin: 9.6 g/dL — ABNORMAL LOW (ref 12.0–15.0)
MCH: 30.5 pg (ref 26.0–34.0)
MCHC: 30.7 g/dL (ref 30.0–36.0)
MCV: 99.4 fL (ref 80.0–100.0)
Platelets: 187 10*3/uL (ref 150–400)
RBC: 3.15 MIL/uL — ABNORMAL LOW (ref 3.87–5.11)
RDW: 12.9 % (ref 11.5–15.5)
WBC: 5.7 10*3/uL (ref 4.0–10.5)
nRBC: 0 % (ref 0.0–0.2)

## 2022-03-12 LAB — CBG MONITORING, ED
Glucose-Capillary: 257 mg/dL — ABNORMAL HIGH (ref 70–99)
Glucose-Capillary: 199 mg/dL — ABNORMAL HIGH (ref 70–99)

## 2022-03-12 LAB — MAGNESIUM: Magnesium: 1.6 mg/dL — ABNORMAL LOW (ref 1.7–2.4)

## 2022-03-12 LAB — D-DIMER, QUANTITATIVE: D-Dimer, Quant: 2.98 ug/mL-FEU — ABNORMAL HIGH (ref 0.00–0.50)

## 2022-03-12 LAB — TROPONIN I (HIGH SENSITIVITY)
Troponin I (High Sensitivity): 8 ng/L (ref <18)
Troponin I (High Sensitivity): 7 ng/L (ref <18)

## 2022-03-12 MED ORDER — IOHEXOL 350 MG/ML SOLN
75.0000 mL | Freq: Once | INTRAVENOUS | Status: AC | PRN
  Administered 2022-03-12: 75 mL via INTRAVENOUS

## 2022-03-12 MED ORDER — LACTATED RINGERS IV BOLUS
500.0000 mL | Freq: Once | INTRAVENOUS | Status: AC
  Administered 2022-03-12: 500 mL via INTRAVENOUS

## 2022-03-12 MED ORDER — MAGNESIUM OXIDE -MG SUPPLEMENT 400 (240 MG) MG PO TABS
200.0000 mg | ORAL_TABLET | Freq: Once | ORAL | Status: AC
  Administered 2022-03-12: 200 mg via ORAL
  Filled 2022-03-12: qty 1

## 2022-03-12 NOTE — ED Notes (Signed)
Attempted IV times 2 and unsuccessful.

## 2022-03-12 NOTE — ED Triage Notes (Signed)
Pt is here from home arrived by EMS. Pt got out of bed per family and seemed fine, then started going in and out of conscious. Pt was recently here for same and diagnosed with dehydration. EMS reports patient perked up after laying patient down.  Pt denies pain. Has dementia and Alzheimers. CBg 175

## 2022-03-12 NOTE — ED Provider Notes (Signed)
Patient with CKD but Cr is at her baseline. Patient with syncope and elevated D-dimer. Benefits of contrasted CT PE scan outweigh the risks of contrast-induced nephropathy. Will hydrate with a 500 cc fluid bolus.   Audley Hose, MD    Audley Hose, MD 03/12/22 2132

## 2022-03-12 NOTE — ED Provider Notes (Signed)
[facility] EMERGENCY DEPARTMENT Provider Note   CSN: [id number] Arrival date & time: 03/12/22  1720     History  Chief Complaint  Patient presents with   Near Syncope    [name] is a 88 y.o. female with dementia, HTN, diabetes, asthma, COPD, CKD stage III, chronic diastolic heart failure, pacemaker in place, history of osteomyelitis, iron deficiency anemia diabetes, history of syncope who presents with syncope.   Patient interviewed alone with no family at bedside.  Patient states that she does not know what happened today but that she is "sick." She states she thinks she passed out earlier. She denies any pain anywhere, any f/c, cough, CP, SOB, palpitations, abd pain, N/V/D/C, dysuria/hematuria, leg swelling.  States that her granddaughter was present during the event. Attempted to call patient's granddaughter [name] but she did not answer with the number in the chart.   Per EMS from triage note, pt got out of bed per family and seemed fine, then started going in and out of conscious. Pt was recently here for same and diagnosed with dehydration. EMS reports patient perked up after laying patient down. POC Blood glucose 175 mg/dL.   Per chart review, patient was recently seen on 03/06/2022 for syncopal episode that occurred while she was sitting at the kitchen table.  At that time granddaughter reported poor p.o. intake without any illnesses.  She presented with a mild AKI on top of chronic CKD.  Was recently admitted prior to that for similar symptoms and had extensive workup including echo, cards consult without obvious cause of her symptoms.  During that prior admission she was felt to be dehydrated.  Head CT on 12/22 was unchanged, pacemaker interrogated without any events.  She was given a liter of fluid and discharged home with return precautions.   Near Syncope       Home Medications Prior to Admission medications   Medication Sig Start Date End  Date Taking? Authorizing Provider  busPIRone (BUSPAR) 5 MG tablet Take 1 tablet (5 mg total) by mouth 3 (three) times daily. 04/29/20   Medina-Vargas, Monina C, NP  donepezil (ARICEPT) 10 MG tablet Take 1 tablet (10 mg total) by mouth at bedtime. 09/05/20   Nita Sells, MD  memantine (NAMENDA XR) 28 MG CP24 24 hr capsule Take 1 capsule (28 mg total) by mouth every morning. 09/05/20   Nita Sells, MD      Allergies    Aspirin    Review of Systems   Review of Systems  Cardiovascular:  Positive for near-syncope.   Review of systems Negative for f/c.  A 10 point review of systems was performed and is negative unless otherwise reported in HPI. However, patient is demented and possibly is an unreliable historian. Unable to contact granddaughter.  Physical Exam Updated Vital Signs BP (!) 146/65   Pulse 60   Temp 98.3 F (36.8 C) (Oral)   Resp 16   SpO2 99%  Physical Exam General: Normal appearing female, lying in bed.  HEENT: PERRLA, Sclera anicteric, MMM, trachea midline.  Cardiology: RRR, no murmurs/rubs/gallops. BL radial and DP pulses equal bilaterally.  Resp: Normal respiratory rate and effort. CTAB, no wheezes, rhonchi, crackles.  Abd: Soft, non-tender, non-distended. No rebound tenderness or guarding.  GU: Deferred. MSK: No peripheral edema or signs of trauma. Extremities without deformity or TTP. No cyanosis or clubbing. Skin: warm, dry. No rashes or lesions. Back: No CVA tenderness Neuro: A&Ox4, CNs II-XII grossly intact. MAEs. Sensation grossly  intact.  Psych: Normal mood and affect.   ED Results / Procedures / Treatments   Labs (all labs ordered are listed, but only abnormal results are displayed) Labs Reviewed  CBC - Abnormal; Notable for the following components:      Result Value   RBC 3.15 (*)    Hemoglobin 9.6 (*)    HCT 31.3 (*)    All other components within normal limits  COMPREHENSIVE METABOLIC PANEL - Abnormal; Notable for the following  components:   Glucose, Bld 208 (*)    BUN 35 (*)    Creatinine, Ser 1.80 (*)    Total Protein 5.6 (*)    Albumin 2.8 (*)    GFR, Estimated 27 (*)    All other components within normal limits  MAGNESIUM - Abnormal; Notable for the following components:   Magnesium 1.6 (*)    All other components within normal limits  D-DIMER, QUANTITATIVE - Abnormal; Notable for the following components:   D-Dimer, Quant 2.98 (*)    All other components within normal limits  CBG MONITORING, ED - Abnormal; Notable for the following components:   Glucose-Capillary 257 (*)    All other components within normal limits  CBG MONITORING, ED - Abnormal; Notable for the following components:   Glucose-Capillary 199 (*)    All other components within normal limits  URINALYSIS, ROUTINE W REFLEX MICROSCOPIC  TROPONIN I (HIGH SENSITIVITY)  TROPONIN I (HIGH SENSITIVITY)    EKG EKG Interpretation  Date/Time:  Thursday March 12 2022 17:40:12 EST Ventricular Rate:  60 PR Interval:  232 QRS Duration: 84 QT Interval:  432 QTC Calculation: 432 R Axis:   -53 Text Interpretation: Atrial-paced rhythm with prolonged AV conduction Left axis deviation Abnormal ECG When compared with ECG of 06-Mar-2022 18:36, poor data quality previous ECG Confirmed by Dorie Rank [phone]) on 03/13/2022 5:48:30 PM  Radiology  CTH: Old right cerebellar infarct Atrophy, chronic microvascular disease. No acute intracranial abnormality.   CT PE: Negative for acute pulmonary embolism. Mild small airway infection/inflammation. Emphysema (ICD10-J43.9). Coronary artery calcification Body wall edema.    Procedures Procedures    Medications Ordered in ED Medications  lactated ringers bolus 500 mL (0 mLs Intravenous Stopped 03/12/22 2310)  iohexol (OMNIPAQUE) 350 MG/ML injection 75 mL (75 mLs Intravenous Contrast Given 03/12/22 2152)  magnesium oxide (MAG-OX) tablet 200 mg (200 mg Oral Given 03/12/22 2310)    ED Course/  Medical Decision Making/ A&P                          Medical Decision Making Amount and/or Complexity of Data Reviewed Labs: ordered. Decision-making details documented in ED Course. Radiology: ordered. Decision-making details documented in ED Course. ECG/medicine tests:  Decision-making details documented in ED Course.  Risk OTC drugs. Prescription drug management.    This patient presents to the ED for concern of syncope, this involves an extensive number of treatment options, and is a complaint that carries with it a high risk of complications and morbidity.  I considered the following differential and admission for this acute, potentially life threatening condition.    MDM:    DDX for syncope, generalized weakness includes but is not limited to: Consider anemia and electrolyte abnormalities as possible etiologies. Consider arrhythmias (will interrogate pacemaker) and ACS, although without associated symptoms, less likely. Consider hemorrhage vs CVA, although neuro intact now for 24 hours and no associated other symptoms, but with fall will obtain CTH. No witnessed seizure-like  activity and no post-ictal period. Consider PE as well, although without hypoxia or sob, will obtain D-dimer as patient has had several episodes like this and has not been evaluated for PE. Consider infection such as viral infection, UTI, PNA. Consider broad differential.  Clinical Course as of 03/31/22 1428  Thu Mar 12, 2022  1922 Glucose-Capillary(!): 199 [HN]  2117 Troponin I (High Sensitivity): 8 [HN]  2117 D-Dimer, Quant(!): 2.98 [HN]  2225 CT Angio Chest PE W and/or Wo Contrast IMPRESSION: Negative for acute pulmonary embolism.  Mild small airway infection/inflammation.  Emphysema (ICD10-J43.9).  Coronary artery calcification  Body wall edema.   [HN]  2225 CT Head Wo Contrast IMPRESSION: Old right cerebellar infarct  Atrophy, chronic microvascular disease.  No acute intracranial  abnormality.   [HN]  2227 ED EKG EKG w/ paced rhythm, LAD, no STEs/Ds, similar to prior tracings [HN]  2228 Creatinine(!): 1.80 Baseline variable, but at her approximate baseline [HN]  2228 Glucose(!): 208 [HN]  2228 Magnesium(!): 1.6 Repleted [HN]  2330 Troponin I (High Sensitivity): 7 [HN]  2332 Pacemaker interrogation okay per rep without any events. Faxing over report.  [HN]  Fri Mar 13, 2022  0000 Attempted again to contact patient's granddaughter but could not get ahold of her. [HN]  0013 Orthostatic vitals without any lightheadedness, okay with standing. Patient is signed out to the oncoming ED physician who is made aware of her history, presentation, exam, workup, and plan.  Plan is to check urine. Patient with reassuring workup and can f/u outpatient as originally planned.  [HN]  0017 Patient takes prazosin at home. Could consider stopping this medication d/t c/f orthostasis on repeated visits to ED. Will recommend to stop taking prazosin and f/u as an outpatient with PCP. [HN]    Clinical Course User Index [HN] Audley Hose, MD     Labs: I Ordered, and personally interpreted labs.  The pertinent results include:  those listed at home  Imaging Studies ordered: I ordered imaging studies including CT PE, CTH I independently visualized and interpreted imaging. I agree with the radiologist interpretation  Additional history obtained from chart review  Cardiac Monitoring: The patient was maintained on a cardiac monitor.  I personally viewed and interpreted the cardiac monitored which showed an underlying rhythm of: NSR  Reevaluation: After the interventions noted above, I reevaluated the patient and found that they have :improved  Social Determinants of Health: Patient lives independently  Disposition:  Patient is signed out to the oncoming ED physician who is made aware of her history, presentation, exam, workup, and plan. Workup overall reassuring and orthostatic  vitals negative currently.  Plan is to check urine, instructed patient to stop taking prazosin as this could contribute to her symptoms, reassess.  PCP f/u in 1-2 weeks.   Co morbidities that complicate the patient evaluation  Past Medical History:  Diagnosis Date   Asthma    CKD (chronic kidney disease) stage 3, GFR 30-59 ml/min ([place]) 05/01/2018   Colostomy present ([place])    hx/notes 07/17/2010   Dementia ([place])    Diabetic neuropathy ([place])    Hypertension    hx/notes 07/17/2010   Pneumonia    recent/notes 10/08/2016   Symptomatic bradycardia 12/15/2017   Type II diabetes mellitus ([place])    hx/notes 07/17/2010     Medicines Meds ordered this encounter  Medications   lactated ringers bolus 500 mL   iohexol (OMNIPAQUE) 350 MG/ML injection 75 mL   magnesium oxide (MAG-OX) tablet 200 mg  I have reviewed the patients home medicines and have made adjustments as needed  Problem List / ED Course: Problem List Items Addressed This Visit       Other   Syncope and collapse - Primary             This note was created using dictation software, which may contain spelling or grammatical errors.    Audley Hose, MD 03/31/22 1434

## 2022-03-13 DIAGNOSIS — Z743 Need for continuous supervision: Secondary | ICD-10-CM | POA: Diagnosis not present

## 2022-03-13 DIAGNOSIS — R531 Weakness: Secondary | ICD-10-CM | POA: Diagnosis not present

## 2022-03-13 LAB — URINALYSIS, ROUTINE W REFLEX MICROSCOPIC
Bilirubin Urine: NEGATIVE
Glucose, UA: NEGATIVE mg/dL
Hgb urine dipstick: NEGATIVE
Ketones, ur: NEGATIVE mg/dL
Leukocytes,Ua: NEGATIVE
Nitrite: NEGATIVE
Protein, ur: NEGATIVE mg/dL
Specific Gravity, Urine: 1.03 (ref 1.005–1.030)
pH: 5 (ref 5.0–8.0)

## 2022-03-13 NOTE — Discharge Instructions (Addendum)
Thank you for coming to [facility] Emergency Department. You were seen for loss of consciousness. We did an exam, labs, and imaging, and these showed no acute findings. Please stay well hydrated. Please stop taking your prazosin at night-time, as this could be contributing to your episodes of loss of consciousness.  Please follow up with your primary care provider within 1 week.   Do not hesitate to return to the ED or call 911 if you experience: -Worsening symptoms -Lightheadedness, passing out -Fevers/chills -Anything else that concerns you

## 2022-03-13 NOTE — ED Notes (Signed)
Tried to call granddaughter again, no answer.

## 2022-03-13 NOTE — ED Notes (Signed)
Granddaughter Shadi Larner [phone] would like an update immediately, she's been waiting all evening

## 2022-03-13 NOTE — ED Notes (Signed)
Spoke with patients grand-daughter about discharge. She will be there when [facility] arrives.

## 2022-03-13 NOTE — ED Notes (Signed)
Tried to call patients granddaughter, Beau Fanny. No answer.

## 2022-03-13 NOTE — ED Notes (Signed)
[facility] is here.  

## 2022-03-13 NOTE — ED Provider Notes (Signed)
Blood pressure (!) 153/76, pulse 60, temperature 97.6 F (36.4 C), temperature source Oral, resp. rate 18, SpO2 100 %.  Assuming care from Dr. Mayra Neer.  In short, [name] is a 88 y.o. female with a chief complaint of Near Syncope .  Refer to the original H&P for additional details.  The current plan of care is to follow up on UA and reassess. Patient with recurrent syncope and recent large evaluation.   UA without infection. Patient not orthostatic here. Attempted to call family for update as well but no answer. Patient stable for d/c.    Margette Fast, MD 03/13/22 [phone]

## 2022-03-13 NOTE — ED Notes (Signed)
Called [facility] for transport.  

## 2022-03-14 ENCOUNTER — Emergency Department (HOSPITAL_COMMUNITY): Payer: Medicare Other

## 2022-03-14 ENCOUNTER — Other Ambulatory Visit: Payer: Self-pay

## 2022-03-14 ENCOUNTER — Observation Stay (HOSPITAL_COMMUNITY)
Admission: EM | Admit: 2022-03-14 | Discharge: 2022-03-17 | Disposition: A | Discharge status: Home Health | Payer: Medicare Other | Attending: Internal Medicine | Admitting: Internal Medicine

## 2022-03-14 ENCOUNTER — Encounter (HOSPITAL_COMMUNITY): Payer: Self-pay | Admitting: *Deleted

## 2022-03-14 DIAGNOSIS — E1122 Type 2 diabetes mellitus with diabetic chronic kidney disease: Secondary | ICD-10-CM | POA: Insufficient documentation

## 2022-03-14 DIAGNOSIS — E1140 Type 2 diabetes mellitus with diabetic neuropathy, unspecified: Secondary | ICD-10-CM | POA: Diagnosis not present

## 2022-03-14 DIAGNOSIS — I130 Hypertensive heart and chronic kidney disease with heart failure and stage 1 through stage 4 chronic kidney disease, or unspecified chronic kidney disease: Secondary | ICD-10-CM | POA: Insufficient documentation

## 2022-03-14 DIAGNOSIS — I10 Essential (primary) hypertension: Secondary | ICD-10-CM | POA: Diagnosis not present

## 2022-03-14 DIAGNOSIS — J449 Chronic obstructive pulmonary disease, unspecified: Secondary | ICD-10-CM | POA: Insufficient documentation

## 2022-03-14 DIAGNOSIS — E46 Unspecified protein-calorie malnutrition: Secondary | ICD-10-CM

## 2022-03-14 DIAGNOSIS — E8809 Other disorders of plasma-protein metabolism, not elsewhere classified: Secondary | ICD-10-CM | POA: Insufficient documentation

## 2022-03-14 DIAGNOSIS — F0390 Unspecified dementia without behavioral disturbance: Secondary | ICD-10-CM | POA: Diagnosis present

## 2022-03-14 DIAGNOSIS — Z79899 Other long term (current) drug therapy: Secondary | ICD-10-CM | POA: Insufficient documentation

## 2022-03-14 DIAGNOSIS — E440 Moderate protein-calorie malnutrition: Secondary | ICD-10-CM | POA: Diagnosis not present

## 2022-03-14 DIAGNOSIS — N179 Acute kidney failure, unspecified: Secondary | ICD-10-CM | POA: Insufficient documentation

## 2022-03-14 DIAGNOSIS — N189 Chronic kidney disease, unspecified: Secondary | ICD-10-CM

## 2022-03-14 DIAGNOSIS — N184 Chronic kidney disease, stage 4 (severe): Secondary | ICD-10-CM | POA: Insufficient documentation

## 2022-03-14 DIAGNOSIS — R531 Weakness: Secondary | ICD-10-CM | POA: Diagnosis not present

## 2022-03-14 DIAGNOSIS — Z950 Presence of cardiac pacemaker: Secondary | ICD-10-CM | POA: Insufficient documentation

## 2022-03-14 DIAGNOSIS — R770 Abnormality of albumin: Secondary | ICD-10-CM | POA: Diagnosis not present

## 2022-03-14 DIAGNOSIS — N390 Urinary tract infection, site not specified: Principal | ICD-10-CM | POA: Diagnosis present

## 2022-03-14 DIAGNOSIS — Z8659 Personal history of other mental and behavioral disorders: Secondary | ICD-10-CM

## 2022-03-14 DIAGNOSIS — Z933 Colostomy status: Secondary | ICD-10-CM

## 2022-03-14 DIAGNOSIS — I5032 Chronic diastolic (congestive) heart failure: Secondary | ICD-10-CM | POA: Insufficient documentation

## 2022-03-14 DIAGNOSIS — Z8709 Personal history of other diseases of the respiratory system: Secondary | ICD-10-CM | POA: Diagnosis not present

## 2022-03-14 DIAGNOSIS — R7303 Prediabetes: Secondary | ICD-10-CM | POA: Diagnosis not present

## 2022-03-14 DIAGNOSIS — N3000 Acute cystitis without hematuria: Secondary | ICD-10-CM | POA: Diagnosis not present

## 2022-03-14 DIAGNOSIS — Z87891 Personal history of nicotine dependence: Secondary | ICD-10-CM | POA: Diagnosis not present

## 2022-03-14 DIAGNOSIS — Z743 Need for continuous supervision: Secondary | ICD-10-CM | POA: Diagnosis not present

## 2022-03-14 DIAGNOSIS — E860 Dehydration: Secondary | ICD-10-CM | POA: Diagnosis not present

## 2022-03-14 DIAGNOSIS — R739 Hyperglycemia, unspecified: Secondary | ICD-10-CM | POA: Diagnosis not present

## 2022-03-14 DIAGNOSIS — J4489 Other specified chronic obstructive pulmonary disease: Secondary | ICD-10-CM | POA: Diagnosis present

## 2022-03-14 DIAGNOSIS — J45909 Unspecified asthma, uncomplicated: Secondary | ICD-10-CM | POA: Insufficient documentation

## 2022-03-14 LAB — COMPREHENSIVE METABOLIC PANEL
ALT: 9 U/L (ref 0–44)
AST: 21 U/L (ref 15–41)
Albumin: 2.9 g/dL — ABNORMAL LOW (ref 3.5–5.0)
Alkaline Phosphatase: 52 U/L (ref 38–126)
Anion gap: 10 (ref 5–15)
BUN: 31 mg/dL — ABNORMAL HIGH (ref 8–23)
CO2: 25 mmol/L (ref 22–32)
Calcium: 8.7 mg/dL — ABNORMAL LOW (ref 8.9–10.3)
Chloride: 105 mmol/L (ref 98–111)
Creatinine, Ser: 2.12 mg/dL — ABNORMAL HIGH (ref 0.44–1.00)
GFR, Estimated: 22 mL/min — ABNORMAL LOW (ref >60)
Glucose, Bld: 235 mg/dL — ABNORMAL HIGH (ref 70–99)
Potassium: 3.6 mmol/L (ref 3.5–5.1)
Sodium: 140 mmol/L (ref 135–145)
Total Bilirubin: 0.2 mg/dL — ABNORMAL LOW (ref 0.3–1.2)
Total Protein: 5.9 g/dL — ABNORMAL LOW (ref 6.5–8.1)

## 2022-03-14 LAB — URINALYSIS, ROUTINE W REFLEX MICROSCOPIC
Bilirubin Urine: NEGATIVE
Glucose, UA: NEGATIVE mg/dL
Hgb urine dipstick: NEGATIVE
Ketones, ur: NEGATIVE mg/dL
Nitrite: NEGATIVE
Protein, ur: 30 mg/dL — AB
Specific Gravity, Urine: 1.031 — ABNORMAL HIGH (ref 1.005–1.030)
pH: 5 (ref 5.0–8.0)

## 2022-03-14 LAB — CBC WITH DIFFERENTIAL/PLATELET
Abs Immature Granulocytes: 0.01 10*3/uL (ref 0.00–0.07)
Basophils Absolute: 0 10*3/uL (ref 0.0–0.1)
Basophils Relative: 1 %
Eosinophils Absolute: 0.1 10*3/uL (ref 0.0–0.5)
Eosinophils Relative: 2 %
HCT: 31.4 % — ABNORMAL LOW (ref 36.0–46.0)
Hemoglobin: 10.1 g/dL — ABNORMAL LOW (ref 12.0–15.0)
Immature Granulocytes: 0 %
Lymphocytes Relative: 17 %
Lymphs Abs: 1 10*3/uL (ref 0.7–4.0)
MCH: 31.3 pg (ref 26.0–34.0)
MCHC: 32.2 g/dL (ref 30.0–36.0)
MCV: 97.2 fL (ref 80.0–100.0)
Monocytes Absolute: 0.4 10*3/uL (ref 0.1–1.0)
Monocytes Relative: 7 %
Neutro Abs: 4.1 10*3/uL (ref 1.7–7.7)
Neutrophils Relative %: 73 %
Platelets: 179 10*3/uL (ref 150–400)
RBC: 3.23 MIL/uL — ABNORMAL LOW (ref 3.87–5.11)
RDW: 12.9 % (ref 11.5–15.5)
WBC: 5.6 10*3/uL (ref 4.0–10.5)
nRBC: 0 % (ref 0.0–0.2)

## 2022-03-14 LAB — MAGNESIUM: Magnesium: 1.6 mg/dL — ABNORMAL LOW (ref 1.7–2.4)

## 2022-03-14 MED ORDER — MAGNESIUM SULFATE IN D5W 1-5 GM/100ML-% IV SOLN
1.0000 g | Freq: Once | INTRAVENOUS | Status: AC
  Administered 2022-03-15: 1 g via INTRAVENOUS
  Filled 2022-03-14: qty 100

## 2022-03-14 MED ORDER — SODIUM CHLORIDE 0.9 % IV SOLN
1.0000 g | Freq: Once | INTRAVENOUS | Status: AC
  Administered 2022-03-15: 1 g via INTRAVENOUS
  Filled 2022-03-14: qty 10

## 2022-03-14 MED ORDER — LACTATED RINGERS IV BOLUS
1000.0000 mL | Freq: Once | INTRAVENOUS | Status: AC
  Administered 2022-03-15: 1000 mL via INTRAVENOUS

## 2022-03-14 NOTE — ED Notes (Signed)
Received verbal report from [place]

## 2022-03-14 NOTE — H&P (Incomplete)
History and Physical    PatientMarland Kitchen [name] JQB:[id number] DOB: [birth year] DOA: 03/14/2022 DOS: the patient was seen and examined on 03/14/2022 PCP: Leeroy Cha, MD  Patient coming from: Home  Chief Complaint:  Chief Complaint  Patient presents with  . Weakness   HPI: [name] is a 88 y.o. female with medical history significant of hypertension, chronic diastolic heart failure, asthma/COPD, type 2 diabetes mellitus, CKD 3B, sinus bradycardia s/p pacemaker, dementia who presents emergency department via EMS from home due to generalized weakness.  Patient was unable to provide history at bedside, she only said that "she feels bad". history was obtained from ED physician and ED medical record.  Per report, patient has had multiple visits to the ER due to episodes of dehydration and generalized weakness, IV fluid was usually given after which patient returns home, only for the symptoms to recur and she returns to the ED.  She denies chest pain, shortness of breath, fever, chills. Patient was recently admitted from 12/13 to 12/16 due to syncopal episode  ED Course: In the emergency department, temperature was 97.7 F, respiratory rate 17/min, pulse 59 bpm, BP 120/56 and O2 sat was 100% on room air.  Workup in the ED showed normocytic anemia, BMP was normal except for BUN/creatinine 31/2.12 (baseline creatinine at 1.5-1.8), albumin 2.9, blood glucose 235, magnesium 1.6, urinalysis was positive for large leukocytes and many bacteria. Chest x-ray showed no active disease Patient was treated with IV ceftriaxone, magnesium was replenished, IV hydration was provided.  Hospitalist was asked to admit patient for further evaluation and management.  Review of Systems: Review of systems as noted in the HPI. All other systems reviewed and are negative.   Past Medical History:  Diagnosis Date  . Asthma   . CKD (chronic kidney disease) stage 3, GFR 30-59 ml/min ([place]) 05/01/2018  .  Colostomy present ([place])    hx/notes 07/17/2010  . Dementia ([place])   . Diabetic neuropathy ([place])   . Hypertension    hx/notes 07/17/2010  . Pneumonia    recent/notes 10/08/2016  . Symptomatic bradycardia 12/15/2017  . Type II diabetes mellitus ([place])    hx/notes 07/17/2010   Past Surgical History:  Procedure Laterality Date  . ABDOMINAL HYSTERECTOMY    . COLOSTOMY     S/P SBO hx/notes 07/17/2010  . LUMBAR FUSION  11/2006   hx/notes 07/17/2010  . NASAL SINUS SURGERY  04/28/2002   Bilateral inferior turbinate reductions, bilateral maxillary antrotomies, bilateral total ethmoidectomies, bilateral frontal recess explorations, bilateral sphenoidotomies, Instatrac guidance./notes 07/28/2010  . PACEMAKER IMPLANT N/A 05/02/2018   Procedure: PACEMAKER IMPLANT;  Surgeon: Evans Lance, MD;  Location: [facility] CV LAB;  Service: Cardiovascular;  Laterality: N/A;    Social History:  reports that she has never smoked. She quit smokeless tobacco use about 5 years ago.  Her smokeless tobacco use included chew. She reports that she does not drink alcohol and does not use drugs.   Allergies  Allergen Reactions  . Aspirin Other (See Comments)    Triggers asthma    Family History  Problem Relation Age of Onset  . Kidney disease Mother   . Hypertension Mother   . Other Father        gangrene with amputation  . Diabetes Sister   . Cancer Brother   . Heart disease Brother   . Schizophrenia Daughter   . Diabetes Daughter   . Stroke Daughter   . Diabetes Sister   . Breast cancer Neg  Hx     ***  Prior to Admission medications   Medication Sig Start Date End Date Taking? Authorizing Provider  busPIRone (BUSPAR) 5 MG tablet Take 1 tablet (5 mg total) by mouth 3 (three) times daily. 04/29/20   Medina-Vargas, Monina C, NP  donepezil (ARICEPT) 10 MG tablet Take 1 tablet (10 mg total) by mouth at bedtime. 09/05/20   Nita Sells, MD  memantine (NAMENDA XR) 28 MG CP24 24 hr capsule Take 1 capsule (28  mg total) by mouth every morning. 09/05/20   Nita Sells, MD    Physical Exam: BP 119/68 (BP Location: Right Arm)   Pulse 60   Temp 97.6 F (36.4 C) (Oral)   Resp 13   Ht 5\' 4"  (1.626 m)   Wt 54 kg   SpO2 100%   BMI 20.43 kg/m   General: 88 y.o. year-old female well developed well nourished in no acute distress.   HEENT: NCAT, EOMI Neck: Supple, trachea medial Cardiovascular: Regular rate and rhythm with no rubs or gallops.  No thyromegaly or JVD noted.  No lower extremity edema. 2/4 pulses in all 4 extremities. Respiratory: Clear to auscultation with no wheezes or rales. Good inspiratory effort. Abdomen: Soft, nontender nondistended with normal bowel sounds x4 quadrants. Muskuloskeletal: No cyanosis, clubbing or edema noted bilaterally Neuro: CN II-XII intact, strength 5/5 x 4, sensation, reflexes intact Skin: No ulcerative lesions noted or rashes Psychiatry: Mood is appropriate for condition and setting          Labs on Admission:  Basic Metabolic Panel: Recent Labs  Lab 03/12/22 1845 03/14/22 1742  NA 140 140  K 3.9 3.6  CL 105 105  CO2 24 25  GLUCOSE 208* 235*  BUN 35* 31*  CREATININE 1.80* 2.12*  CALCIUM 9.2 8.7*  MG 1.6* 1.6*   Liver Function Tests: Recent Labs  Lab 03/12/22 1845 03/14/22 1742  AST 17 21  ALT 10 9  ALKPHOS 45 52  BILITOT 0.4 0.2*  PROT 5.6* 5.9*  ALBUMIN 2.8* 2.9*   No results for input(s): "LIPASE", "AMYLASE" in the last 168 hours. No results for input(s): "AMMONIA" in the last 168 hours. CBC: Recent Labs  Lab 03/12/22 1845 03/14/22 1742  WBC 5.7 5.6  NEUTROABS  --  4.1  HGB 9.6* 10.1*  HCT 31.3* 31.4*  MCV 99.4 97.2  PLT 187 179   Cardiac Enzymes: No results for input(s): "CKTOTAL", "CKMB", "CKMBINDEX", "TROPONINI" in the last 168 hours.  BNP (last 3 results) Recent Labs    02/25/22 2018  BNP 83.6    ProBNP (last 3 results) No results for input(s): "PROBNP" in the last 8760 hours.  CBG: Recent Labs   Lab 03/12/22 1755 03/12/22 1845  GLUCAP 257* 199*    Radiological Exams on Admission: DG Chest Port 1 View  Result Date: 03/14/2022 CLINICAL DATA:  Weakness EXAM: PORTABLE CHEST 1 VIEW COMPARISON:  02/25/2022 and prior radiographs FINDINGS: The cardiomediastinal silhouette is unremarkable. LEFT-sided pacemaker is unchanged. There is no evidence of focal airspace disease, pulmonary edema, suspicious pulmonary nodule/mass, pleural effusion, or pneumothorax. No acute bony abnormalities are identified. Degenerative changes in the shoulders again noted. IMPRESSION: No active disease. Electronically Signed   By: Margarette Canada M.D.   On: 03/14/2022 18:20    EKG: I independently viewed the EKG done and my findings are as followed: Normal sinus rhythm at rate of 60 bpm  Assessment/Plan Present on Admission: . UTI (urinary tract infection)  Principal Problem:   UTI (urinary  tract infection)  UTI POA Patient was treated with IV ceftriaxone, she will continue same at this time with plan to de-escalate/discontinue based on urine culture. Urine culture pending  Orthostatic hypotension Acute kidney injury on CKD stage IV Hypoalbuminemia possibly secondary to moderate protein calorie malnutrition Hypomagnesemia Type 2 diabetes mellitus with uncontrolled hyperglycemia Essential hypertension History of asthma/COPD History of dementia Colostomy in situ    DVT prophylaxis: ***   Code Status: ***   Family Communication: ***   Disposition Plan: ***   Consults called: ***   Admission status: ***     Bernadette Hoit MD [facility] Pager [phone]  If 7PM-7AM, please contact night-coverage [email or web address] Password [facility]  03/14/2022, 11:46 PM        Review of Systems: {ROS_Text:26778} Past Medical History:  Diagnosis Date  . Asthma   . CKD (chronic kidney disease) stage 3, GFR 30-59 ml/min ([place]) 05/01/2018  . Colostomy present ([place])    hx/notes 07/17/2010  . Dementia  ([place])   . Diabetic neuropathy ([place])   . Hypertension    hx/notes 07/17/2010  . Pneumonia    recent/notes 10/08/2016  . Symptomatic bradycardia 12/15/2017  . Type II diabetes mellitus ([place])    hx/notes 07/17/2010   Past Surgical History:  Procedure Laterality Date  . ABDOMINAL HYSTERECTOMY    . COLOSTOMY     S/P SBO hx/notes 07/17/2010  . LUMBAR FUSION  11/2006   hx/notes 07/17/2010  . NASAL SINUS SURGERY  04/28/2002   Bilateral inferior turbinate reductions, bilateral maxillary antrotomies, bilateral total ethmoidectomies, bilateral frontal recess explorations, bilateral sphenoidotomies, Instatrac guidance./notes 07/28/2010  . PACEMAKER IMPLANT N/A 05/02/2018   Procedure: PACEMAKER IMPLANT;  Surgeon: Evans Lance, MD;  Location: [place] CV LAB;  Service: Cardiovascular;  Laterality: N/A;   Social History:  reports that she has never smoked. She quit smokeless tobacco use about 5 years ago.  Her smokeless tobacco use included chew. She reports that she does not drink alcohol and does not use drugs.  Allergies  Allergen Reactions  . Aspirin Other (See Comments)    Triggers asthma    Family History  Problem Relation Age of Onset  . Kidney disease Mother   . Hypertension Mother   . Other Father        gangrene with amputation  . Diabetes Sister   . Cancer Brother   . Heart disease Brother   . Schizophrenia Daughter   . Diabetes Daughter   . Stroke Daughter   . Diabetes Sister   . Breast cancer Neg Hx     Prior to Admission medications   Medication Sig Start Date End Date Taking? Authorizing Provider  busPIRone (BUSPAR) 5 MG tablet Take 1 tablet (5 mg total) by mouth 3 (three) times daily. 04/29/20   Medina-Vargas, Monina C, NP  donepezil (ARICEPT) 10 MG tablet Take 1 tablet (10 mg total) by mouth at bedtime. 09/05/20   Nita Sells, MD  memantine (NAMENDA XR) 28 MG CP24 24 hr capsule Take 1 capsule (28 mg total) by mouth every morning. 09/05/20   Nita Sells, MD     Physical Exam: Vitals:   03/14/22 2230 03/14/22 2300 03/14/22 2315 03/14/22 2336  BP: 123/68 119/67 (!) 150/71 119/68  Pulse: 60 60 60 60  Resp: 18 12 12 13   Temp:    97.6 F (36.4 C)  TempSrc:    Oral  SpO2: 100% 100% 98% 100%  Weight:      Height:       ***  Data Reviewed: {Tip this will not be part of the note when signed- Document your independent interpretation of telemetry tracing, EKG, lab, Radiology test or any other diagnostic tests. Add any new diagnostic test ordered today. (Optional):26781} {Results:26384}  Assessment and Plan: No notes have been filed under this hospital service. Service: Hospitalist     Advance Care Planning:   Code Status: Prior ***  Consults: ***  Family Communication: ***  Severity of Illness: {Observation/Inpatient:21159}  Author: Bernadette Hoit, DO 03/14/2022 11:42 PM  For on call review [email or web address].

## 2022-03-14 NOTE — ED Provider Notes (Incomplete)
[name] EMERGENCY DEPARTMENT Provider Note   CSN: [id number] Arrival date & time: 03/14/22  1730     History {Add pertinent medical, surgical, social history, OB history to HPI:1} Chief Complaint  Patient presents with   Weakness    [name] is a 88 y.o. female.  Patient presents to the emergency department for evaluation of generalized weakness.  Patient has had multiple episodes of dehydration and generalized weakness recently.  He has had ER visits as well as admissions for this.  Patient seems to do well after IV fluids for a period of time, and then goes home and then symptoms recur.  She can only tell me that she feels "bad".  She does not endorse any specific symptoms.       Home Medications Prior to Admission medications   Medication Sig Start Date End Date Taking? Authorizing Provider  busPIRone (BUSPAR) 5 MG tablet Take 1 tablet (5 mg total) by mouth 3 (three) times daily. 04/29/20   Medina-Vargas, Monina C, NP  donepezil (ARICEPT) 10 MG tablet Take 1 tablet (10 mg total) by mouth at bedtime. 09/05/20   Nita Sells, MD  memantine (NAMENDA XR) 28 MG CP24 24 hr capsule Take 1 capsule (28 mg total) by mouth every morning. 09/05/20   Nita Sells, MD      Allergies    Aspirin    Review of Systems   Review of Systems  Physical Exam Updated Vital Signs BP (!) 91/51 (BP Location: Right Arm)   Pulse 60   Temp 97.7 F (36.5 C) (Oral)   Resp 15   Ht 5\' 4"  (1.626 m)   Wt 54 kg   SpO2 100%   BMI 20.43 kg/m  Physical Exam Vitals and nursing note reviewed.  Constitutional:      General: She is not in acute distress.    Appearance: She is well-developed.  HENT:     Head: Normocephalic and atraumatic.     Mouth/Throat:     Mouth: Mucous membranes are moist.  Eyes:     General: Vision grossly intact. Gaze aligned appropriately.     Extraocular Movements: Extraocular movements intact.     Conjunctiva/sclera: Conjunctivae  normal.  Cardiovascular:     Rate and Rhythm: Normal rate and regular rhythm.     Pulses: Normal pulses.     Heart sounds: Normal heart sounds, S1 normal and S2 normal. No murmur heard.    No friction rub. No gallop.  Pulmonary:     Effort: Pulmonary effort is normal. No respiratory distress.     Breath sounds: Normal breath sounds.  Abdominal:     General: Bowel sounds are normal.     Palpations: Abdomen is soft.     Tenderness: There is no abdominal tenderness. There is no guarding or rebound.     Hernia: No hernia is present.  Musculoskeletal:        General: No swelling.     Cervical back: Full passive range of motion without pain, normal range of motion and neck supple. No spinous process tenderness or muscular tenderness. Normal range of motion.     Right lower leg: No edema.     Left lower leg: No edema.  Skin:    General: Skin is warm and dry.     Capillary Refill: Capillary refill takes less than 2 seconds.     Findings: No ecchymosis, erythema, rash or wound.  Neurological:     General: No focal deficit present.  Mental Status: She is alert.     GCS: GCS eye subscore is 4. GCS verbal subscore is 5. GCS motor subscore is 6.     Cranial Nerves: Cranial nerves 2-12 are intact.     Sensory: Sensation is intact.     Motor: Motor function is intact.     Coordination: Coordination is intact.  Psychiatric:        Attention and Perception: Attention normal.        Mood and Affect: Mood normal.        Speech: Speech normal.        Behavior: Behavior normal.     ED Results / Procedures / Treatments   Labs (all labs ordered are listed, but only abnormal results are displayed) Labs Reviewed  CBC WITH DIFFERENTIAL/PLATELET - Abnormal; Notable for the following components:      Result Value   RBC 3.23 (*)    Hemoglobin 10.1 (*)    HCT 31.4 (*)    All other components within normal limits  COMPREHENSIVE METABOLIC PANEL - Abnormal; Notable for the following components:    Glucose, Bld 235 (*)    BUN 31 (*)    Creatinine, Ser 2.12 (*)    Calcium 8.7 (*)    Total Protein 5.9 (*)    Albumin 2.9 (*)    Total Bilirubin 0.2 (*)    GFR, Estimated 22 (*)    All other components within normal limits  MAGNESIUM - Abnormal; Notable for the following components:   Magnesium 1.6 (*)    All other components within normal limits  URINALYSIS, ROUTINE W REFLEX MICROSCOPIC    EKG None  Radiology DG Chest Port 1 View  Result Date: 03/14/2022 CLINICAL DATA:  Weakness EXAM: PORTABLE CHEST 1 VIEW COMPARISON:  02/25/2022 and prior radiographs FINDINGS: The cardiomediastinal silhouette is unremarkable. LEFT-sided pacemaker is unchanged. There is no evidence of focal airspace disease, pulmonary edema, suspicious pulmonary nodule/mass, pleural effusion, or pneumothorax. No acute bony abnormalities are identified. Degenerative changes in the shoulders again noted. IMPRESSION: No active disease. Electronically Signed   By: Margarette Canada M.D.   On: 03/14/2022 18:20   CT Angio Chest PE W and/or Wo Contrast  Result Date: 03/12/2022 CLINICAL DATA:  Positive D-dimer.  Pulmonary embolism suspected EXAM: CT ANGIOGRAPHY CHEST WITH CONTRAST TECHNIQUE: Multidetector CT imaging of the chest was performed using the standard protocol during bolus administration of intravenous contrast. Multiplanar CT image reconstructions and MIPs were obtained to evaluate the vascular anatomy. RADIATION DOSE REDUCTION: This exam was performed according to the departmental dose-optimization program which includes automated exposure control, adjustment of the mA and/or kV according to patient size and/or use of iterative reconstruction technique. CONTRAST:  56mL OMNIPAQUE IOHEXOL 350 MG/ML SOLN COMPARISON:  CTA chest 06/05/2004 FINDINGS: Cardiovascular: Satisfactory opacification of the pulmonary arteries to the segmental level. No evidence of pulmonary embolism. Cardiomegaly. No pericardial effusion. Left chest wall  pacemaker. Coronary artery calcification. Stable mild dilation of the ascending aorta measuring 3.9 cm. Mediastinum/Nodes: No enlarged mediastinal, hilar, or axillary lymph nodes. Thyroid gland, trachea, and esophagus demonstrate no significant findings. Lungs/Pleura: Emphysema. No focal consolidation, pleural effusion, or pneumothorax. Mild bronchial wall thickening and mucous plugging in the left lower lobe. Small bilateral pleural effusions. Bibasilar atelectasis/scarring. Mild bronchiolectasis in the right middle lobe medially. Upper Abdomen: No acute abnormality. Musculoskeletal: No acute fracture.  Body wall edema. Review of the MIP images confirms the above findings. IMPRESSION: Negative for acute pulmonary embolism. Mild small airway infection/inflammation.  Emphysema (ICD10-J43.9). Coronary artery calcification Body wall edema. Electronically Signed   By: Placido Sou M.D.   On: 03/12/2022 22:08   CT Head Wo Contrast  Result Date: 03/12/2022 CLINICAL DATA:  Head trauma, minor (Age >= 65y) EXAM: CT HEAD WITHOUT CONTRAST TECHNIQUE: Contiguous axial images were obtained from the base of the skull through the vertex without intravenous contrast. RADIATION DOSE REDUCTION: This exam was performed according to the departmental dose-optimization program which includes automated exposure control, adjustment of the mA and/or kV according to patient size and/or use of iterative reconstruction technique. COMPARISON:  03/06/2022 FINDINGS: Brain: Old right cerebellar infarct again noted, unchanged. There is atrophy and chronic small vessel disease changes. No acute intracranial abnormality. Specifically, no hemorrhage, hydrocephalus, mass lesion, acute infarction, or significant intracranial injury. Vascular: No hyperdense vessel or unexpected calcification. Skull: No acute calvarial abnormality. Sinuses/Orbits: No acute findings Other: None IMPRESSION: Old right cerebellar infarct Atrophy, chronic microvascular  disease. No acute intracranial abnormality. Electronically Signed   By: Rolm Baptise M.D.   On: 03/12/2022 21:07    Procedures Procedures  {Document cardiac monitor, telemetry assessment procedure when appropriate:1}  Medications Ordered in ED Medications - No data to display  ED Course/ Medical Decision Making/ A&P                           Medical Decision Making Amount and/or Complexity of Data Reviewed Labs: ordered. Radiology: ordered.   Patient with a history of recurrent syncope in the past.  This has generally been felt secondary to dehydration in the past.  She has had multiple ER visits as well as hospitalization for this in the past.  Family report that patient seems to do well after she gets hydrated but then becomes weak and has syncopal episodes again when she is at home.  Patient cannot tell me exactly what is going on, reports that she "feels bad".  Patient hypotensive at arrival, has improved with IV fluids.  Patient with acute kidney injury, above what was seen 1 day ago in the ED.  Additionally, urinalysis now appears to show infection which she did not the other day.  Patient initiated on IV fluid bolus, Rocephin, will be admitted for further management.   {Document critical care time when appropriate:1} {Document review of labs and clinical decision tools ie heart score, Chads2Vasc2 etc:1}  {Document your independent review of radiology images, and any outside records:1} {Document your discussion with family members, caretakers, and with consultants:1} {Document social determinants of health affecting pt's care:1} {Document your decision making why or why not admission, treatments were needed:1} Final Clinical Impression(s) / ED Diagnoses Final diagnoses:  None    Rx / DC Orders ED Discharge Orders     None

## 2022-03-14 NOTE — ED Triage Notes (Signed)
Pt here via [facility] from home for weakness with change of position.  Tx for same complaint many times.    Bp with standing 84/50 hr 62 Laying 112/62 hr 62 Cbg 285 Sats 99%  Per ems pt does not drink fluids at home.

## 2022-03-14 NOTE — H&P (Addendum)
History and Physical    Patient: [name] NLZ:[id number] DOB: 10/26/1933 DOA: 03/14/2022 DOS: the patient was seen and examined on 03/15/2022 PCP: Leeroy Cha, MD  Patient coming from: Home  Chief Complaint:  Chief Complaint  Patient presents with   Weakness   HPI: [name] is a 88 y.o. female with medical history significant of hypertension, chronic diastolic heart failure, asthma/COPD, type 2 diabetes mellitus, CKD 3B, sinus bradycardia s/p pacemaker, dementia who presents emergency department via EMS from home due to generalized weakness.  Patient was unable to provide history at bedside, she only said that "she feels bad". history was obtained from ED physician and ED medical record.  Per report, patient has had multiple visits to the ER due to episodes of dehydration and generalized weakness, IV fluid was usually given after which patient returns home, only for the symptoms to recur and she returns to the ED.  She denies chest pain, shortness of breath, fever, chills. Patient was recently admitted from 12/13 to 12/16 due to syncopal episode  ED Course: In the emergency department, temperature was 97.7 F, respiratory rate 17/min, pulse 59 bpm, BP 120/56 and O2 sat was 100% on room air.  Workup in the ED showed normocytic anemia, BMP was normal except for BUN/creatinine 31/2.12 (baseline creatinine at 1.5-1.8), albumin 2.9, blood glucose 235, magnesium 1.6, urinalysis was positive for large leukocytes and many bacteria. Chest x-ray showed no active disease Patient was treated with IV ceftriaxone, magnesium was replenished, IV hydration was provided.  Hospitalist was asked to admit patient for further evaluation and management.  Review of Systems: Review of systems as noted in the HPI. All other systems reviewed and are negative.   Past Medical History:  Diagnosis Date   Asthma    CKD (chronic kidney disease) stage 3, GFR 30-59 ml/min (HCC) 05/01/2018   Colostomy  present ([place])    hx/notes 07/17/2010   Dementia ([place])    Diabetic neuropathy ([place])    Hypertension    hx/notes 07/17/2010   Pneumonia    recent/notes 10/08/2016   Symptomatic bradycardia 12/15/2017   Type II diabetes mellitus ([place])    hx/notes 07/17/2010   Past Surgical History:  Procedure Laterality Date   ABDOMINAL HYSTERECTOMY     COLOSTOMY     S/P SBO hx/notes 07/17/2010   LUMBAR FUSION  11/2006   hx/notes 07/17/2010   NASAL SINUS SURGERY  04/28/2002   Bilateral inferior turbinate reductions, bilateral maxillary antrotomies, bilateral total ethmoidectomies, bilateral frontal recess explorations, bilateral sphenoidotomies, Instatrac guidance./notes 07/28/2010   PACEMAKER IMPLANT N/A 05/02/2018   Procedure: PACEMAKER IMPLANT;  Surgeon: Evans Lance, MD;  Location: [facility];  Service: Cardiovascular;  Laterality: N/A;    Social History:  reports that she has never smoked. She quit smokeless tobacco use about 5 years ago.  Her smokeless tobacco use included chew. She reports that she does not drink alcohol and does not use drugs.   Allergies  Allergen Reactions   Aspirin Other (See Comments)    Triggers asthma    Family History  Problem Relation Age of Onset   Kidney disease Mother    Hypertension Mother    Other Father        gangrene with amputation   Diabetes Sister    Cancer Brother    Heart disease Brother    Schizophrenia Daughter    Diabetes Daughter    Stroke Daughter    Diabetes Sister    Breast cancer Neg  Hx      Prior to Admission medications   Medication Sig Start Date End Date Taking? Authorizing Provider  busPIRone (BUSPAR) 5 MG tablet Take 1 tablet (5 mg total) by mouth 3 (three) times daily. 04/29/20   Medina-Vargas, Monina C, NP  donepezil (ARICEPT) 10 MG tablet Take 1 tablet (10 mg total) by mouth at bedtime. 09/05/20   Nita Sells, MD  memantine (NAMENDA XR) 28 MG CP24 24 hr capsule Take 1 capsule (28 mg total) by mouth every morning.  09/05/20   Nita Sells, MD    Physical Exam: BP 119/68 (BP Location: Right Arm)   Pulse 60   Temp 97.6 F (36.4 C) (Oral)   Resp 13   Ht 5\' 4"  (1.626 m)   Wt 54 kg   SpO2 100%   BMI 20.43 kg/m   General: 88 y.o. year-old female well developed well nourished in no acute distress.   HEENT: NCAT, EOMI Neck: Supple, trachea medial Cardiovascular: Regular rate and rhythm with no rubs or gallops.  No thyromegaly or JVD noted.  No lower extremity edema. 2/4 pulses in all 4 extremities. Respiratory: Clear to auscultation with no wheezes or rales. Good inspiratory effort. Abdomen: Soft, nontender nondistended with normal bowel sounds x4 quadrants. Muskuloskeletal: No cyanosis, clubbing or edema noted bilaterally Neuro: CN II-XII intact, strength 5/5 x 4, sensation, reflexes intact Skin: No ulcerative lesions noted or rashes Psychiatry: Mood is appropriate for condition and setting          Labs on Admission:  Basic Metabolic Panel: Recent Labs  Lab 03/12/22 1845 03/14/22 1742  NA 140 140  K 3.9 3.6  CL 105 105  CO2 24 25  GLUCOSE 208* 235*  BUN 35* 31*  CREATININE 1.80* 2.12*  CALCIUM 9.2 8.7*  MG 1.6* 1.6*   Liver Function Tests: Recent Labs  Lab 03/12/22 1845 03/14/22 1742  AST 17 21  ALT 10 9  ALKPHOS 45 52  BILITOT 0.4 0.2*  PROT 5.6* 5.9*  ALBUMIN 2.8* 2.9*   No results for input(s): "LIPASE", "AMYLASE" in the last 168 hours. No results for input(s): "AMMONIA" in the last 168 hours. CBC: Recent Labs  Lab 03/12/22 1845 03/14/22 1742  WBC 5.7 5.6  NEUTROABS  --  4.1  HGB 9.6* 10.1*  HCT 31.3* 31.4*  MCV 99.4 97.2  PLT 187 179   Cardiac Enzymes: No results for input(s): "CKTOTAL", "CKMB", "CKMBINDEX", "TROPONINI" in the last 168 hours.  BNP (last 3 results) Recent Labs    02/25/22 2018  BNP 83.6    ProBNP (last 3 results) No results for input(s): "PROBNP" in the last 8760 hours.  CBG: Recent Labs  Lab 03/12/22 1755 03/12/22 1845   GLUCAP 257* 199*    Radiological Exams on Admission: DG Chest Port 1 View  Result Date: 03/14/2022 CLINICAL DATA:  Weakness EXAM: PORTABLE CHEST 1 VIEW COMPARISON:  02/25/2022 and prior radiographs FINDINGS: The cardiomediastinal silhouette is unremarkable. LEFT-sided pacemaker is unchanged. There is no evidence of focal airspace disease, pulmonary edema, suspicious pulmonary nodule/mass, pleural effusion, or pneumothorax. No acute bony abnormalities are identified. Degenerative changes in the shoulders again noted. IMPRESSION: No active disease. Electronically Signed   By: Margarette Canada M.D.   On: 03/14/2022 18:20    EKG: I independently viewed the EKG done and my findings are as followed: Normal sinus rhythm at rate of 60 bpm  Assessment/Plan Present on Admission:  UTI (urinary tract infection)  Acute kidney injury superimposed on chronic kidney  disease ([place])  Hypomagnesemia  Essential hypertension  Dementia without behavioral disturbance (HCC)  Asthma with chronic obstructive pulmonary disease (COPD)  Principal Problem:   UTI (urinary tract infection) Active Problems:   Asthma with chronic obstructive pulmonary disease (COPD)   Essential hypertension   Dementia without behavioral disturbance (HCC)   Colostomy present ([place])   Acute kidney injury superimposed on chronic kidney disease ([place])   Hypomagnesemia   Hypoalbuminemia due to protein-calorie malnutrition ([place])   Prediabetes  UTI POA Patient was treated with IV ceftriaxone, she will continue same at this time with plan to de-escalate/discontinue based on urine culture. Urine culture pending  Acute kidney injury on CKD stage IV Dehydration Daughter reports that patient does not drink water at home BUN/creatinine 31/2.12 (baseline creatinine at 1.5-1.8) Continue gentle hydration Renally adjust medications, avoid nephrotoxic agents/dehydration/hypotension  Hypoalbuminemia possibly secondary to moderate protein calorie  malnutrition Albumin 2.9, protein supplement to be provided  Hypomagnesemia Magnesium 1.6, this was replenished  Prediabetes with uncontrolled hyperglycemia Hemoglobin A1c about 1 year ago was 5.7 Blood glucose at 235 Continue insulin sliding scale and hypoglycemic protocol Hemoglobin A1c will be checked  Essential hypertension-controlled Continue to monitor BP Patient is not on any antihypertensive medication per med rec  History of asthma/COPD (not in acute exacerbation) There was no antiasthmatics/COPD medications on med rec  History of dementia Continue Aricept and Namenda  Colostomy in situ Continue colostomy care daily  Goal of care: Palliative care will be consulted   DVT prophylaxis: Lovenox  Code Status: Full code  Family Communication: None at bedside  Consults: None  Severity of Illness: The appropriate patient status for this patient is OBSERVATION. Observation status is judged to be reasonable and necessary in order to provide the required intensity of service to ensure the patient's safety. The patient's presenting symptoms, physical exam findings, and initial radiographic and laboratory data in the context of their medical condition is felt to place them at decreased risk for further clinical deterioration. Furthermore, it is anticipated that the patient will be medically stable for discharge from the hospital within 2 midnights of admission.   Author: Bernadette Hoit, DO 03/15/2022 12:37 AM  For on call review [email or web address].

## 2022-03-15 DIAGNOSIS — R7303 Prediabetes: Secondary | ICD-10-CM | POA: Insufficient documentation

## 2022-03-15 DIAGNOSIS — N390 Urinary tract infection, site not specified: Secondary | ICD-10-CM

## 2022-03-15 DIAGNOSIS — E46 Unspecified protein-calorie malnutrition: Secondary | ICD-10-CM | POA: Diagnosis not present

## 2022-03-15 DIAGNOSIS — N3000 Acute cystitis without hematuria: Secondary | ICD-10-CM | POA: Diagnosis not present

## 2022-03-15 DIAGNOSIS — Z7189 Other specified counseling: Secondary | ICD-10-CM | POA: Diagnosis not present

## 2022-03-15 DIAGNOSIS — E8809 Other disorders of plasma-protein metabolism, not elsewhere classified: Secondary | ICD-10-CM | POA: Diagnosis not present

## 2022-03-15 DIAGNOSIS — F0390 Unspecified dementia without behavioral disturbance: Secondary | ICD-10-CM

## 2022-03-15 DIAGNOSIS — N179 Acute kidney failure, unspecified: Secondary | ICD-10-CM | POA: Diagnosis not present

## 2022-03-15 LAB — CBG MONITORING, ED
Glucose-Capillary: 101 mg/dL — ABNORMAL HIGH (ref 70–99)
Glucose-Capillary: 113 mg/dL — ABNORMAL HIGH (ref 70–99)

## 2022-03-15 LAB — CBC
HCT: 30.2 % — ABNORMAL LOW (ref 36.0–46.0)
HCT: 29.4 % — ABNORMAL LOW (ref 36.0–46.0)
Hemoglobin: 9.6 g/dL — ABNORMAL LOW (ref 12.0–15.0)
Hemoglobin: 9.4 g/dL — ABNORMAL LOW (ref 12.0–15.0)
MCH: 30.9 pg (ref 26.0–34.0)
MCH: 31.1 pg (ref 26.0–34.0)
MCHC: 31.8 g/dL (ref 30.0–36.0)
MCHC: 32 g/dL (ref 30.0–36.0)
MCV: 97.1 fL (ref 80.0–100.0)
MCV: 97.4 fL (ref 80.0–100.0)
Platelets: 180 10*3/uL (ref 150–400)
Platelets: 169 10*3/uL (ref 150–400)
RBC: 3.11 MIL/uL — ABNORMAL LOW (ref 3.87–5.11)
RBC: 3.02 MIL/uL — ABNORMAL LOW (ref 3.87–5.11)
RDW: 13.1 % (ref 11.5–15.5)
RDW: 13.1 % (ref 11.5–15.5)
WBC: 5.5 10*3/uL (ref 4.0–10.5)
WBC: 4.1 10*3/uL (ref 4.0–10.5)
nRBC: 0 % (ref 0.0–0.2)
nRBC: 0 % (ref 0.0–0.2)

## 2022-03-15 LAB — COMPREHENSIVE METABOLIC PANEL
ALT: 10 U/L (ref 0–44)
AST: 21 U/L (ref 15–41)
Albumin: 2.6 g/dL — ABNORMAL LOW (ref 3.5–5.0)
Alkaline Phosphatase: 54 U/L (ref 38–126)
Anion gap: 10 (ref 5–15)
BUN: 28 mg/dL — ABNORMAL HIGH (ref 8–23)
CO2: 26 mmol/L (ref 22–32)
Calcium: 8.8 mg/dL — ABNORMAL LOW (ref 8.9–10.3)
Chloride: 105 mmol/L (ref 98–111)
Creatinine, Ser: 1.74 mg/dL — ABNORMAL HIGH (ref 0.44–1.00)
GFR, Estimated: 28 mL/min — ABNORMAL LOW (ref >60)
Glucose, Bld: 165 mg/dL — ABNORMAL HIGH (ref 70–99)
Potassium: 3.9 mmol/L (ref 3.5–5.1)
Sodium: 141 mmol/L (ref 135–145)
Total Bilirubin: 0.5 mg/dL (ref 0.3–1.2)
Total Protein: 5.6 g/dL — ABNORMAL LOW (ref 6.5–8.1)

## 2022-03-15 LAB — GLUCOSE, CAPILLARY
Glucose-Capillary: 115 mg/dL — ABNORMAL HIGH (ref 70–99)
Glucose-Capillary: 196 mg/dL — ABNORMAL HIGH (ref 70–99)

## 2022-03-15 LAB — CREATININE, SERUM
Creatinine, Ser: 2.02 mg/dL — ABNORMAL HIGH (ref 0.44–1.00)
GFR, Estimated: 24 mL/min — ABNORMAL LOW (ref >60)

## 2022-03-15 LAB — PHOSPHORUS: Phosphorus: 3.2 mg/dL (ref 2.5–4.6)

## 2022-03-15 LAB — MAGNESIUM: Magnesium: 1.8 mg/dL (ref 1.7–2.4)

## 2022-03-15 MED ORDER — ENOXAPARIN SODIUM 30 MG/0.3ML IJ SOSY
30.0000 mg | PREFILLED_SYRINGE | INTRAMUSCULAR | Status: DC
  Administered 2022-03-15 – 2022-03-17 (×3): 30 mg via SUBCUTANEOUS
  Filled 2022-03-15 (×3): qty 0.3

## 2022-03-15 MED ORDER — ACETAMINOPHEN 650 MG RE SUPP: 650.0000 mg | Freq: Four times a day (QID) | RECTAL | Status: DC | PRN

## 2022-03-15 MED ORDER — BUSPIRONE HCL 5 MG PO TABS
5.0000 mg | ORAL_TABLET | Freq: Three times a day (TID) | ORAL | Status: DC
  Administered 2022-03-15 – 2022-03-17 (×7): 5 mg via ORAL
  Filled 2022-03-15 (×7): qty 1

## 2022-03-15 MED ORDER — DONEPEZIL HCL 10 MG PO TABS
10.0000 mg | ORAL_TABLET | Freq: Every day | ORAL | Status: DC
  Administered 2022-03-15 – 2022-03-16 (×2): 10 mg via ORAL
  Filled 2022-03-15 (×2): qty 1

## 2022-03-15 MED ORDER — MEMANTINE HCL ER 28 MG PO CP24
28.0000 mg | ORAL_CAPSULE | Freq: Every morning | ORAL | Status: DC
  Administered 2022-03-15 – 2022-03-17 (×3): 28 mg via ORAL
  Filled 2022-03-15 (×3): qty 1

## 2022-03-15 MED ORDER — ONDANSETRON HCL 4 MG/2ML IJ SOLN: 4.0000 mg | Freq: Four times a day (QID) | INTRAMUSCULAR | Status: DC | PRN

## 2022-03-15 MED ORDER — LACTATED RINGERS IV SOLN: INTRAVENOUS | Status: AC

## 2022-03-15 MED ORDER — GLUCERNA SHAKE PO LIQD
237.0000 mL | Freq: Three times a day (TID) | ORAL | Status: DC
  Administered 2022-03-15 – 2022-03-17 (×6): 237 mL via ORAL

## 2022-03-15 MED ORDER — INFLUENZA VAC A&B SA ADJ QUAD 0.5 ML IM PRSY
0.5000 mL | PREFILLED_SYRINGE | INTRAMUSCULAR | Status: DC
  Filled 2022-03-15: qty 0.5

## 2022-03-15 MED ORDER — PNEUMOCOCCAL 20-VAL CONJ VACC 0.5 ML IM SUSY
0.5000 mL | PREFILLED_SYRINGE | INTRAMUSCULAR | Status: DC
  Filled 2022-03-15: qty 0.5

## 2022-03-15 MED ORDER — ACETAMINOPHEN 325 MG PO TABS: 650.0000 mg | ORAL_TABLET | Freq: Four times a day (QID) | ORAL | Status: DC | PRN

## 2022-03-15 MED ORDER — HYDRALAZINE HCL 20 MG/ML IJ SOLN
10.0000 mg | INTRAMUSCULAR | Status: DC | PRN
  Administered 2022-03-15: 10 mg via INTRAVENOUS
  Filled 2022-03-15: qty 1

## 2022-03-15 MED ORDER — INSULIN ASPART 100 UNIT/ML IJ SOLN
0.0000 [IU] | Freq: Three times a day (TID) | INTRAMUSCULAR | Status: DC
  Administered 2022-03-16: 1 [IU] via SUBCUTANEOUS

## 2022-03-15 MED ORDER — ONDANSETRON HCL 4 MG PO TABS: 4.0000 mg | ORAL_TABLET | Freq: Four times a day (QID) | ORAL | Status: DC | PRN

## 2022-03-15 MED ORDER — SODIUM CHLORIDE 0.9 % IV SOLN
1.0000 g | INTRAVENOUS | Status: DC
  Administered 2022-03-15 – 2022-03-17 (×3): 1 g via INTRAVENOUS
  Filled 2022-03-15 (×3): qty 10

## 2022-03-15 NOTE — ED Notes (Signed)
RN attempt for morning labs x2 unsuccessful, Phlebotomy notified and requested to come attempt

## 2022-03-15 NOTE — ED Notes (Signed)
Admit provider at bedside 

## 2022-03-15 NOTE — Consult Note (Addendum)
Consultation Note Date: 03/15/2022   Patient Name: [name]  DOB: 1934/03/16  MRN: [id number]  Age / Sex: 88 y.o., female  PCP: Leeroy Cha, MD Referring Physician: Rodena Goldmann, DO  Reason for Consultation: Establishing goals of care  HPI/Patient Profile: 88 y.o. female  with past medical history of hypertension, chronic diastolic heart failure, asthma/COPD, type 2 diabetes mellitus, CKD 3B, sinus bradycardia s/p pacemaker, dementia, s/p colostomy admitted on 03/14/2022 with weakness.   Patient has had 4 ED visits in the past 6 months and 2 admissions, with current admission within 30 days of most recent admission.  Current admission for UTI and AKI on CKD 4.  PMT has been consulted to assist with goals of care conversation.  Clinical Assessment and Goals of Care:  I have reviewed medical records including EPIC notes, labs and imaging, the report from RN, assessed the patient and then met at the bedside to discuss diagnosis prognosis, GOC, EOL wishes, disposition and options.  I attempted to call patient's granddaughter [name] but was unable to reach.  Voicemail with PMT contact information was provided.  I introduced Palliative Medicine as specialized medical care for people living with serious illness. It focuses on providing relief from the symptoms and stress of a serious illness. The goal is to improve quality of life for both the patient and the family.  We discussed a brief life review of the patient and then focused on their current illness.   I attempted to elicit values and goals of care important to the patient.    Medical History Review and Understanding:  Patient is not sure why she is in the hospital.  I reviewed her UTI and AKI, relationship to dehydration, and concern for the recurrent nature of these complications.  She is surprised to hear this.  She states "I need to drink  her water then."  Discussion: Initially met with patient the bedside and introduced my role as a palliative medicine provider.  I attempted to call her granddaughter to participate in initial goals of care conversation but was unable to reach.  I left her a voicemail then returned to the bedside and explored patient's thoughts regarding her own quality of life.  She agrees this is very important to consider.  She is unable, however, to tell me of anything that brings quality of life or joy and meaning to her existence.  She cannot think of any hobbies, favorite foods/drinks, etc.  She does tell me that she is spiritual woman and prays frequently.  She states that she has 1 daughter (Diane) who passed away and 1 living daughter, [name] - per chart this is her granddaughter.  She tells me that she eats what ever is brought her and does not fuss too much about choices.  She states she sometimes cooks as well, although this is questionable given her dementia.  She reports occasionally walking and always using her walker.  She states he will likely have to keep trying to call her granddaughter. Of note, patient has a MOST form on file from 09/27/2019 indicating desire for full code/full scope treatment.  However, DNR is on file is more recent from 08/31/20 and she was also DNR on 6/15- 08/31/2021.  It is unclear why CODE STATUS is now full code.  Patient and family were also agreeable to hospice referral during June 2022 hospitalization, however she was determined ineligible upon review by [facility].  She is likely eligible at this time given progressive decline and  recurrent infections/poor intake.  Will continue attempts to reach out to granddaughter for goals of care discussions.  Addendum: Received return call from patient's granddaughter [name].  She shares her concern about patient's decline, particularly over the last month.  Patient is frequently having fainting spells and her head falls on the table right  after eating.  She essentially just lays in bed sleeping all day and wakes up to eat, then goes back to sleep.  [name] states she "eats so much she throws up" and refuses water.   Emotional support and therapeutic listening was provided as she shared her frustrations with how mean the patient gets when water intake is encouraged.  She confirms patient was previously on hospice and then discharged given her stability at that time.  Shared updates on my conversation with her grandmother and she confirms that it does not seem patient has any quality of life, or understanding of what that means.   Provided education on the signs and symptoms of dementia progression. I recommended consideration of hospice given patient's poor nutritional status and ongoing decline, reviewed available in-home hospice support in detail.  She agrees that patient is not best served spending most of her time coming to and from the hospital, particularly at her age.  She is hesitant however that hospice would not provide IV fluids.  Reviewed the risks of IV fluids as dementia progresses and mobility decreases.  Emphasized that this is not a benign intervention and it may worsen her quality of life.  Provided updates on patient's likely eligibility for hospice and also offered outpatient palliative care to continue the conversation.  She is agreeable.  She knows patient would not want to go to a SNF and she would not trust the care received there, but she inquires as to whether this could be arranged given caregiver burden.  I advised that this is not likely given current observation status. She would like PT to see her at least during her admission, however.  Her hope is that patient will be able to have some home health PT.  She states "what do I do if she keeps passing out?" I advised that worsening lethargy and regulation of her orthostatics is likely another sign of disease progression indicating appropriateness of transition to  hospice.  We also discussed her other chronic comorbidities.  She verbalizes her understanding appreciation.  Explored [name]'s thoughts on CODE STATUS. Recommended consideration of DNR status, understanding evidenced-based poor outcomes in similar hospitalized patients, as the cause of the arrest is likely associated with chronic/terminal disease rather than a reversible acute cardio-pulmonary event.  She is hesitant about patient going through CPR would not want her to suffer unnecessarily.  She does want to at least attempt mechanical ventilation according to patient's prior wishes.  I shared my concern that patient is likely too weak and frail and would not be able to wean from the ventilator if this is pursued.  She would still like to consider at least trying this reflect further on CPR.   Questions and concerns were addressed. The family was encouraged to call with questions or concerns.  PMT will continue to support holistically.   SUMMARY OF RECOMMENDATIONS   -Continue full code/full scope treatment for now -[name] is hesitant about CPR and will continue thinking about this.  She would want a trial mechanical ventilation -Patient is likely hospice eligible at this time if aligned with goals of care.  TOC consulted for referral to outpatient palliative care to  continue the conversation based on clinical course -PT consulted for evaluation at granddaughter's request.  Goal is for continued efforts at medical stabilization and she is hopeful for home health support -Psychosocial and emotional support provided -PMT will continue to follow and support   Prognosis:  Poor long-term prognosis given failure to thrive, dementia, CHF/COPD, CKD 4, advanced age  Discharge Planning: To Be Determined      Primary Diagnoses: Present on Admission:  UTI (urinary tract infection)  Acute kidney injury superimposed on chronic kidney disease ([place])  Hypomagnesemia  Essential  hypertension  Dementia without behavioral disturbance (HCC)  Asthma with chronic obstructive pulmonary disease (COPD)  Physical Exam Vitals and nursing note reviewed.  Constitutional:      General: She is not in acute distress. Cardiovascular:     Rate and Rhythm: Normal rate.  Pulmonary:     Effort: Pulmonary effort is normal.  Neurological:     Mental Status: She is alert. Mental status is at baseline.  Psychiatric:        Mood and Affect: Mood normal.        Behavior: Behavior normal.    Vital Signs: BP (!) 148/71 (BP Location: Left Arm)   Pulse (!) 59   Temp 98.1 F (36.7 C) (Oral)   Resp 16   Ht 5' 4" (1.626 m)   Wt 54 kg   SpO2 100%   BMI 20.43 kg/m  Pain Scale: 0-10   Pain Score: 0-No pain   SpO2: SpO2: 100 % O2 Device:SpO2: 100 % O2 Flow Rate: .    Palliative Assessment/Data: 40%    Total time: I spent 95 minutes in the care of the patient today in the above activities and documenting the encounter.  MDM: High   Johnell Comings Palliative Medicine Team Team phone # [phone]  Thank you for allowing the Palliative Medicine Team to assist in the care of this patient. Please utilize secure chat with additional questions, if there is no response within 30 minutes please call the above phone number.  Palliative Medicine Team providers are available by phone from 7am to 7pm daily and can be reached through the team cell phone.  Should this patient require assistance outside of these hours, please call the patient's attending physician.  Portions of this note are a verbal dictation therefore any spelling and/or grammatical errors are due to the "[employer]" system interpretation.

## 2022-03-15 NOTE — Progress Notes (Signed)
PROGRESS NOTE    MAHAM QUINTIN  VEL:[id number] DOB: 24-Jul-1933 DOA: 03/14/2022 PCP: Leeroy Cha, MD   Brief Narrative:  [name] is a 88 y.o. female with medical history significant of hypertension, chronic diastolic heart failure, asthma/COPD, type 2 diabetes mellitus, CKD 3B, sinus bradycardia s/p pacemaker, dementia who presents emergency department via EMS from home due to generalized weakness.  Patient was unable to provide history at bedside, she only said that "she feels bad". history was obtained from ED physician and ED medical record.  Patient was admitted with symptomatic UTI and started on Rocephin.  She is also noted to have AKI on CKD stage IV with baseline creatinine 1.5-1.8 and has been started on IV fluid hydration.   Assessment & Plan:   Principal Problem:   UTI (urinary tract infection) Active Problems:   Asthma with chronic obstructive pulmonary disease (COPD)   Essential hypertension   Dementia without behavioral disturbance (HCC)   Colostomy present ([place])   Acute kidney injury superimposed on chronic kidney disease (HCC)   Hypomagnesemia   Hypoalbuminemia due to protein-calorie malnutrition (HCC)   Prediabetes  Assessment and Plan:   UTI POA Patient was treated with IV ceftriaxone, she will continue same at this time with plan to de-escalate/discontinue based on urine culture. Urine culture pending   Acute kidney injury on CKD stage IV Dehydration Daughter reports that patient does not drink water at home BUN/creatinine 31/2.12 (baseline creatinine at 1.5-1.8) Continue gentle hydration Renally adjust medications, avoid nephrotoxic agents/dehydration/hypotension   Hypoalbuminemia possibly secondary to moderate protein calorie malnutrition Albumin 2.9, protein supplement to be provided   Prediabetes with uncontrolled hyperglycemia Hemoglobin A1c about 1 year ago was 5.7 Blood glucose at 235 Continue insulin sliding scale and  hypoglycemic protocol Hemoglobin A1c will be checked   Essential hypertension-controlled Continue to monitor BP Patient is not on any antihypertensive medication per med rec   History of asthma/COPD (not in acute exacerbation) There was no antiasthmatics/COPD medications on med rec   History of dementia Continue Aricept and Namenda   Colostomy in situ Continue colostomy care daily   Goal of care: Palliative care will be consulted   DVT prophylaxis: Lovenox Code Status: Full Family Communication: None at bedside Disposition Plan:  Status is: Observation The patient will require care spanning > 2 midnights and should be moved to inpatient because: Need for IV fluids  Consultants:  Palliative  Procedures:  None  Antimicrobials:  Anti-infectives (From admission, onward)    Start     Dose/Rate Route Frequency Ordered Stop   03/15/22 1000  cefTRIAXone (ROCEPHIN) 1 g in sodium chloride 0.9 % 100 mL IVPB        1 g 200 mL/hr over 30 Minutes Intravenous Every 24 hours 03/15/22 0006     03/14/22 2300  cefTRIAXone (ROCEPHIN) 1 g in sodium chloride 0.9 % 100 mL IVPB        1 g 200 mL/hr over 30 Minutes Intravenous  Once 03/14/22 2247 03/15/22 0121       Subjective: Patient seen and evaluated today with no new acute complaints or concerns. No acute concerns or events noted overnight. She continues to have ongoing weakness.  Objective: Vitals:   03/14/22 2230 03/14/22 2300 03/14/22 2315 03/14/22 2336  BP: 123/68 119/67 (!) 150/71 119/68  Pulse: 60 60 60 60  Resp: 18 12 12 13   Temp:    97.6 F (36.4 C)  TempSrc:    Oral  SpO2: 100% 100% 98% 100%  Weight:      Height:        Intake/Output Summary (Last 24 hours) at 03/15/2022 0636 Last data filed at 03/15/2022 0232 Gross per 24 hour  Intake 1199.02 ml  Output --  Net 1199.02 ml   Filed Weights   03/14/22 1836  Weight: 54 kg    Examination:  General exam: Appears calm and comfortable  Respiratory system:  Clear to auscultation. Respiratory effort normal. Cardiovascular system: S1 & S2 heard, RRR.  Gastrointestinal system: Abdomen is soft Central nervous system: Alert and awake Extremities: No edema Skin: No significant lesions noted Psychiatry: Flat affect.    Data Reviewed: I have personally reviewed following labs and imaging studies  CBC: Recent Labs  Lab 03/12/22 1845 03/14/22 1742 03/15/22 0050  WBC 5.7 5.6 5.5  NEUTROABS  --  4.1  --   HGB 9.6* 10.1* 9.6*  HCT 31.3* 31.4* 30.2*  MCV 99.4 97.2 97.1  PLT 187 179 497   Basic Metabolic Panel: Recent Labs  Lab 03/12/22 1845 03/14/22 1742 03/15/22 0050  NA 140 140  --   K 3.9 3.6  --   CL 105 105  --   CO2 24 25  --   GLUCOSE 208* 235*  --   BUN 35* 31*  --   CREATININE 1.80* 2.12* 2.02*  CALCIUM 9.2 8.7*  --   MG 1.6* 1.6*  --    GFR: Estimated Creatinine Clearance: 17 mL/min (A) (by C-G formula based on SCr of 2.02 mg/dL (H)). Liver Function Tests: Recent Labs  Lab 03/12/22 1845 03/14/22 1742  AST 17 21  ALT 10 9  ALKPHOS 45 52  BILITOT 0.4 0.2*  PROT 5.6* 5.9*  ALBUMIN 2.8* 2.9*   No results for input(s): "LIPASE", "AMYLASE" in the last 168 hours. No results for input(s): "AMMONIA" in the last 168 hours. Coagulation Profile: No results for input(s): "INR", "PROTIME" in the last 168 hours. Cardiac Enzymes: No results for input(s): "CKTOTAL", "CKMB", "CKMBINDEX", "TROPONINI" in the last 168 hours. BNP (last 3 results) No results for input(s): "PROBNP" in the last 8760 hours. HbA1C: No results for input(s): "HGBA1C" in the last 72 hours. CBG: Recent Labs  Lab 03/12/22 1755 03/12/22 1845  GLUCAP 257* 199*   Lipid Profile: No results for input(s): "CHOL", "HDL", "LDLCALC", "TRIG", "CHOLHDL", "LDLDIRECT" in the last 72 hours. Thyroid Function Tests: No results for input(s): "TSH", "T4TOTAL", "FREET4", "T3FREE", "THYROIDAB" in the last 72 hours. Anemia Panel: No results for input(s):  "VITAMINB12", "FOLATE", "FERRITIN", "TIBC", "IRON", "RETICCTPCT" in the last 72 hours. Sepsis Labs: No results for input(s): "PROCALCITON", "LATICACIDVEN" in the last 168 hours.  No results found for this or any previous visit (from the past 240 hour(s)).       Radiology Studies: DG Chest Port 1 View  Result Date: 03/14/2022 CLINICAL DATA:  Weakness EXAM: PORTABLE CHEST 1 VIEW COMPARISON:  02/25/2022 and prior radiographs FINDINGS: The cardiomediastinal silhouette is unremarkable. LEFT-sided pacemaker is unchanged. There is no evidence of focal airspace disease, pulmonary edema, suspicious pulmonary nodule/mass, pleural effusion, or pneumothorax. No acute bony abnormalities are identified. Degenerative changes in the shoulders again noted. IMPRESSION: No active disease. Electronically Signed   By: Margarette Canada M.D.   On: 03/14/2022 18:20        Scheduled Meds:  enoxaparin (LOVENOX) injection  30 mg Subcutaneous Q24H   feeding supplement (GLUCERNA SHAKE)  237 mL Oral TID BM   insulin aspart  0-9 Units Subcutaneous TID WC   Continuous  Infusions:  cefTRIAXone (ROCEPHIN)  IV     lactated ringers 75 mL/hr at 03/15/22 0240     LOS: 0 days    Time spent: 35 minutes    Halsey Hammen Darleen Crocker, DO [facility]  If 7PM-7AM, please contact night-coverage [email or web address] 03/15/2022, 6:36 AM

## 2022-03-16 DIAGNOSIS — Z515 Encounter for palliative care: Secondary | ICD-10-CM | POA: Diagnosis not present

## 2022-03-16 DIAGNOSIS — Z789 Other specified health status: Secondary | ICD-10-CM

## 2022-03-16 DIAGNOSIS — N390 Urinary tract infection, site not specified: Secondary | ICD-10-CM

## 2022-03-16 DIAGNOSIS — Z7189 Other specified counseling: Secondary | ICD-10-CM

## 2022-03-16 DIAGNOSIS — Z711 Person with feared health complaint in whom no diagnosis is made: Secondary | ICD-10-CM | POA: Diagnosis not present

## 2022-03-16 DIAGNOSIS — N179 Acute kidney failure, unspecified: Secondary | ICD-10-CM | POA: Diagnosis not present

## 2022-03-16 DIAGNOSIS — N3000 Acute cystitis without hematuria: Secondary | ICD-10-CM | POA: Diagnosis not present

## 2022-03-16 LAB — CBC
HCT: 28.8 % — ABNORMAL LOW (ref 36.0–46.0)
Hemoglobin: 8.9 g/dL — ABNORMAL LOW (ref 12.0–15.0)
MCH: 30 pg (ref 26.0–34.0)
MCHC: 30.9 g/dL (ref 30.0–36.0)
MCV: 97 fL (ref 80.0–100.0)
Platelets: 163 10*3/uL (ref 150–400)
RBC: 2.97 MIL/uL — ABNORMAL LOW (ref 3.87–5.11)
RDW: 13 % (ref 11.5–15.5)
WBC: 5.6 10*3/uL (ref 4.0–10.5)
nRBC: 0 % (ref 0.0–0.2)

## 2022-03-16 LAB — BASIC METABOLIC PANEL
Anion gap: 6 (ref 5–15)
BUN: 33 mg/dL — ABNORMAL HIGH (ref 8–23)
CO2: 27 mmol/L (ref 22–32)
Calcium: 8.3 mg/dL — ABNORMAL LOW (ref 8.9–10.3)
Chloride: 105 mmol/L (ref 98–111)
Creatinine, Ser: 1.79 mg/dL — ABNORMAL HIGH (ref 0.44–1.00)
GFR, Estimated: 27 mL/min — ABNORMAL LOW (ref >60)
Glucose, Bld: 111 mg/dL — ABNORMAL HIGH (ref 70–99)
Potassium: 4.6 mmol/L (ref 3.5–5.1)
Sodium: 138 mmol/L (ref 135–145)

## 2022-03-16 LAB — MAGNESIUM: Magnesium: 1.8 mg/dL (ref 1.7–2.4)

## 2022-03-16 LAB — GLUCOSE, CAPILLARY
Glucose-Capillary: 108 mg/dL — ABNORMAL HIGH (ref 70–99)
Glucose-Capillary: 112 mg/dL — ABNORMAL HIGH (ref 70–99)
Glucose-Capillary: 128 mg/dL — ABNORMAL HIGH (ref 70–99)
Glucose-Capillary: 109 mg/dL — ABNORMAL HIGH (ref 70–99)

## 2022-03-16 LAB — URINE CULTURE

## 2022-03-16 MED ORDER — PNEUMOCOCCAL 20-VAL CONJ VACC 0.5 ML IM SUSY: 0.5000 mL | PREFILLED_SYRINGE | INTRAMUSCULAR | Status: DC | PRN

## 2022-03-16 MED ORDER — INFLUENZA VAC A&B SA ADJ QUAD 0.5 ML IM PRSY: 0.5000 mL | PREFILLED_SYRINGE | INTRAMUSCULAR | Status: DC | PRN

## 2022-03-16 NOTE — Discharge Summary (Signed)
Physician Discharge Summary  [name] BJY:[id number] DOB: [birth year] DOA: 03/14/2022  PCP: Sandy Cha, MD  Admit date: 03/14/2022 Discharge date: 03/17/2022  Admitted From: Home Discharge disposition: Home with home health PT   Brief narrative: [name] is a 89 y.o. female with PMH significant for PMH significant for DM2, HTN, chronic diastolic CHF, CKD, asthma/COPD, sinus bradycardia s/p pacemaker, diabetic neuropathy, dementia, colostomy status. 12/30, patient was brought to the ED by EMS from home due to generalized weakness.   Often, patient has had 4 ED visits in the last 6 months and 2 admissions with the current admission within 30 days of the most recent admission.  In the ED, patient was afebrile, hemodynamically stable. BUN/creatinine was elevated 31/112.  Baseline of less than 1.8. Urinalysis was positive for large leukocytes and many bacteria. Chest x-ray unremarkable Patient was started on IV fluid, IV antibiotics Admitted to [facility] for UTI, AKI on CKD See below for details Palliative care consultation was obtained as well.  Subjective: Patient was seen and examined this morning.  Elderly African-American female.  Sitting up in recliner.  Not in distress.  Feels better than at presentation.  Not on supplemental oxygen.  States she states with her daughter. Chart reviewed In the last 24 hours, heart rate close to 50s, blood pressure mostly elevated, 150s this morning Labs from this morning with creatinine 1.79, hemoglobin 8.9  Assessment and plan: UTI  Present with generalized weakness, lethargy Abnormal urinalysis as above.  Urine culture was obtained.  Nursing hydrated. Received 3 doses of IV Rocephin.  Afebrile.  WBC count normal.  Will discharge home today. Recent Labs  Lab 03/14/22 1742 03/15/22 0050 03/15/22 0900 03/16/22 0251 03/17/22 0336  WBC 5.6 5.5 4.1 5.6 5.2   Acute kidney injury on CKD stage IV Baseline creatinine 1.5-1.8.   Presented with creatinine elevated 2.12. Daughter reports that patient does not drink water at home Improving creatinine with gentle hydration.  Encourage oral hydration post discharge Recent Labs    02/26/22 0100 02/27/22 0230 02/27/22 1348 02/28/22 0148 03/06/22 1906 03/12/22 1845 03/14/22 1742 03/15/22 0050 03/15/22 0900 03/16/22 0251 03/17/22 0336  BUN 29* 37* 29* 40* 31* 35* 31*  --  28* 33* 39*  CREATININE 1.54* 2.01* 1.67* 1.58* 1.94* 1.80* 2.12* 2.02* 1.74* 1.79* 2.05*   Type 2 diabetes mellitus A1c controlled at 5.7 on 04/07/2020 PTA on not on any antidiabetic meds  Essential hypertension Not on BP meds at home  Asthma/COPD Continue bronchodilators  Moderate protein calorie malnutrition Hypoalbuminemia Albumin 2.9, protein supplement to be provided Encourage oral intake    History of dementia PTA only on BuSpar, Aricept and Namenda Continue the same.   Colostomy in situ Continue colostomy care daily  Impaired mobility PT eval obtained.  Home with PT recommended.  Wounds:  -    Discharge Exam:   Vitals:   03/16/22 0930 03/16/22 2013 03/17/22 0518 03/17/22 0731  BP: (!) 153/64 (!) 119/107 117/61 (!) 144/69  Pulse: (!) 59 70 66 60  Resp: 16 16 17 18   Temp: 97.8 F (36.6 C) 98.2 F (36.8 C) 98.5 F (36.9 C) 98.4 F (36.9 C)  TempSrc: Oral Oral  Oral  SpO2: 100% 98% 100% 98%  Weight:      Height:        Body mass index is 15.7 kg/m.  General exam: Pleasant, elderly African-American female.  Not in physical distress Skin: No rashes, lesions or ulcers. HEENT: Atraumatic, normocephalic, no obvious bleeding  Lungs: Clear to auscultation bilaterally CVS: Regular rate and rhythm, no murmur GI/Abd soft, nontender, nondistended, bowel sound present, colostomy status CNS: Alert, awake, oriented x 3 Psychiatry: Mood appropriate Extremities: No pedal edema, no calf tenderness  Follow ups:    Follow-up Information     [facility]  Follow up.   Specialty: Home Health Services Why: [name] will be providing home health services for physical therapy.  They will call you to set up services in the next 24-48 hours. Contact information: [place] [address] [phone]         [facility] Follow up.   Specialty: PALLIATIVE CARE Contact information: [address] [place] [address] [phone]                Discharge Instructions:   Discharge Instructions     Call MD for:  difficulty breathing, headache or visual disturbances   Complete by: As directed    Call MD for:  extreme fatigue   Complete by: As directed    Call MD for:  hives   Complete by: As directed    Call MD for:  persistant dizziness or light-headedness   Complete by: As directed    Call MD for:  persistant nausea and vomiting   Complete by: As directed    Call MD for:  severe uncontrolled pain   Complete by: As directed    Call MD for:  temperature >100.4   Complete by: As directed    Diet general   Complete by: As directed    Discharge instructions   Complete by: As directed    General discharge instructions: Follow with Primary MD Sandy Cha, MD in 7 days  Please request your PCP  to go over your hospital tests, procedures, radiology results at the follow up. Please get your medicines reviewed and adjusted.  Your PCP may decide to repeat certain labs or tests as needed. Do not drive, operate heavy machinery, perform activities at heights, swimming or participation in water activities or provide baby sitting services if your were admitted for syncope or siezures until you have seen by Primary MD or a Neurologist and advised to do so again. [place] Controlled Substance Reporting System database was reviewed. Do not drive, operate heavy machinery, perform activities at heights, swim, participate in water activities or provide baby-sitting services  while on medications for pain, sleep and mood until your outpatient physician has reevaluated you and advised to do so again.  You are strongly recommended to comply with the dose, frequency and duration of prescribed medications. Activity: As tolerated with Full fall precautions use walker/cane & assistance as needed Avoid using any recreational substances like cigarette, tobacco, alcohol, or non-prescribed drug. If you experience worsening of your admission symptoms, develop shortness of breath, life threatening emergency, suicidal or homicidal thoughts you must seek medical attention immediately by calling 911 or calling your MD immediately  if symptoms less severe. You must read complete instructions/literature along with all the possible adverse reactions/side effects for all the medicines you take and that have been prescribed to you. Take any new medicine only after you have completely understood and accepted all the possible adverse reactions/side effects.  Wear Seat belts while driving. You were cared for by a hospitalist during your hospital stay. If you have any questions about your discharge medications or the care you received while you were in the hospital after you are discharged, you  can call the unit and ask to speak with the hospitalist or the covering physician. Once you are discharged, your primary care physician will handle any further medical issues. Please note that NO REFILLS for any discharge medications will be authorized once you are discharged, as it is imperative that you return to your primary care physician (or establish a relationship with a primary care physician if you do not have one).   Increase activity slowly   Complete by: As directed        Discharge Medications:   Allergies as of 03/17/2022       Reactions   Aspirin Other (See Comments)   Triggers asthma        Medication List     TAKE these medications    busPIRone 5 MG tablet Commonly known as:  BUSPAR Take 1 tablet (5 mg total) by mouth 3 (three) times daily.   donepezil 10 MG tablet Commonly known as: ARICEPT Take 1 tablet (10 mg total) by mouth at bedtime.   memantine 28 MG Cp24 24 hr capsule Commonly known as: NAMENDA XR Take 1 capsule (28 mg total) by mouth every morning.         The results of significant diagnostics from this hospitalization (including imaging, microbiology, ancillary and laboratory) are listed below for reference.    Procedures and Diagnostic Studies:   DG Chest Port 1 View  Result Date: 03/14/2022 CLINICAL DATA:  Weakness EXAM: PORTABLE CHEST 1 VIEW COMPARISON:  02/25/2022 and prior radiographs FINDINGS: The cardiomediastinal silhouette is unremarkable. LEFT-sided pacemaker is unchanged. There is no evidence of focal airspace disease, pulmonary edema, suspicious pulmonary nodule/mass, pleural effusion, or pneumothorax. No acute bony abnormalities are identified. Degenerative changes in the shoulders again noted. IMPRESSION: No active disease. Electronically Signed   By: Margarette Canada M.D.   On: 03/14/2022 18:20     Labs:   Basic Metabolic Panel: Recent Labs  Lab 03/12/22 1845 03/14/22 1742 03/15/22 0050 03/15/22 0900 03/16/22 0251 03/17/22 0336  NA 140 140  --  141 138 140  K 3.9 3.6  --  3.9 4.6 4.7  CL 105 105  --  105 105 105  CO2 24 25  --  26 27 24   GLUCOSE 208* 235*  --  165* 111* 114*  BUN 35* 31*  --  28* 33* 39*  CREATININE 1.80* 2.12* 2.02* 1.74* 1.79* 2.05*  CALCIUM 9.2 8.7*  --  8.8* 8.3* 8.6*  MG 1.6* 1.6*  --  1.8 1.8  --   PHOS  --   --   --  3.2  --   --    GFR Estimated Creatinine Clearance: 12.9 mL/min (A) (by C-G formula based on SCr of 2.05 mg/dL (H)). Liver Function Tests: Recent Labs  Lab 03/12/22 1845 03/14/22 1742 03/15/22 0900  AST 17 21 21   ALT 10 9 10   ALKPHOS 45 52 54  BILITOT 0.4 0.2* 0.5  PROT 5.6* 5.9* 5.6*  ALBUMIN 2.8* 2.9* 2.6*   No results for input(s): "LIPASE", "AMYLASE" in the last  168 hours. No results for input(s): "AMMONIA" in the last 168 hours. Coagulation profile No results for input(s): "INR", "PROTIME" in the last 168 hours.  CBC: Recent Labs  Lab 03/14/22 1742 03/15/22 0050 03/15/22 0900 03/16/22 0251 03/17/22 0336  WBC 5.6 5.5 4.1 5.6 5.2  NEUTROABS 4.1  --   --   --  3.0  HGB 10.1* 9.6* 9.4* 8.9* 9.5*  HCT 31.4* 30.2* 29.4* 28.8* 29.5*  MCV 97.2 97.1 97.4 97.0 96.1  PLT 179 180 169 163 157   Cardiac Enzymes: No results for input(s): "CKTOTAL", "CKMB", "CKMBINDEX", "TROPONINI" in the last 168 hours. BNP: Invalid input(s): "POCBNP" CBG: Recent Labs  Lab 03/16/22 1133 03/16/22 1642 03/16/22 2111 03/17/22 0733 03/17/22 1140  GLUCAP 112* 128* 109* 106* 136*   D-Dimer No results for input(s): "DDIMER" in the last 72 hours. Hgb A1c Recent Labs    03/15/22 0050  HGBA1C 6.4*   Lipid Profile No results for input(s): "CHOL", "HDL", "LDLCALC", "TRIG", "CHOLHDL", "LDLDIRECT" in the last 72 hours. Thyroid function studies No results for input(s): "TSH", "T4TOTAL", "T3FREE", "THYROIDAB" in the last 72 hours.  Invalid input(s): "FREET3" Anemia work up No results for input(s): "VITAMINB12", "FOLATE", "FERRITIN", "TIBC", "IRON", "RETICCTPCT" in the last 72 hours. Microbiology Recent Results (from the past 240 hour(s))  Urine Culture     Status: Abnormal   Collection Time: 03/15/22 11:00 AM   Specimen: Urine, Clean Catch  Result Value Ref Range Status   Specimen Description URINE, CLEAN CATCH  Final   Special Requests   Final    NONE Performed at [facility], [address] [place],  [address]    Culture MULTIPLE SPECIES PRESENT, SUGGEST RECOLLECTION (A)  Final   Report Status 03/16/2022 FINAL  Final    Time coordinating discharge: 35 minutes  Signed: Gunhild Bautch  [facility] 03/17/2022, 1:20 PM

## 2022-03-16 NOTE — Progress Notes (Signed)
PROGRESS NOTE  [name]  DOB: 02/19/1934  PCP: Leeroy Cha, MD MWU:[id number]  DOA: 03/14/2022  LOS: 0 days  Hospital Day: 3  Brief narrative: [name] is a 89 y.o. female with PMH significant for PMH significant for DM2, HTN, chronic diastolic CHF, CKD, asthma/COPD, sinus bradycardia s/p pacemaker, diabetic neuropathy, dementia, colostomy status. 12/30, patient was brought to the ED by EMS from home due to generalized weakness.   Often, patient has had 4 ED visits in the last 6 months and 2 admissions with the current admission within 30 days of the most recent admission.  In the ED, patient was afebrile, hemodynamically stable. BUN/creatinine was elevated 31/112.  Baseline of less than 1.8. Urinalysis was positive for large leukocytes and many bacteria. Chest x-ray unremarkable Patient was started on IV fluid, IV antibiotics Admitted to [facility] for UTI, AKI on CKD See below for details Palliative care consultation was obtained as well.  Subjective: Patient was seen and examined this morning.  Elderly African-American female.  Sitting up in recliner.  Not in distress.  Feels better than at presentation.  Not on supplemental oxygen.  States she states with her daughter. Chart reviewed In the last 24 hours, heart rate close to 50s, blood pressure mostly elevated, 150s this morning Labs from this morning with creatinine 1.79, hemoglobin 8.9  Assessment and plan: UTI  Present with generalized weakness, lethargy Abnormal urinalysis as above.  Urine culture sent.  Pending culture report IV Rocephin was started. No fever.  WBC count remains normal Recent Labs  Lab 03/12/22 1845 03/14/22 1742 03/15/22 0050 03/15/22 0900 03/16/22 0251  WBC 5.7 5.6 5.5 4.1 5.6   Acute kidney injury on CKD stage IV Baseline creatinine 1.5-1.8.  Presented with creatinine elevated 2.12. Daughter reports that patient does not drink water at home Improving creatinine with gentle  hydration. Recent Labs    02/25/22 2018 02/26/22 0100 02/27/22 0230 02/27/22 1348 02/28/22 0148 03/06/22 1906 03/12/22 1845 03/14/22 1742 03/15/22 0050 03/15/22 0900 03/16/22 0251  BUN 34* 29* 37* 29* 40* 31* 35* 31*  --  28* 33*  CREATININE 1.83* 1.54* 2.01* 1.67* 1.58* 1.94* 1.80* 2.12* 2.02* 1.74* 1.79*   Type 2 diabetes mellitus A1c controlled at 5.7 on 04/07/2020 PTA on not on any antidiabetic meds Currently on sliding scale insulin with Accu-Cheks Recent Labs  Lab 03/15/22 0830 03/15/22 1222 03/15/22 1739 03/15/22 2210 03/16/22 0913  GLUCAP 101* 113* 115* 196* 108*   Essential hypertension Blood pressure mostly elevated in last 24 hours, 158/67 this morning PTA, patient was not on any antihypertensive. Currently on IV hydralazine as needed  Asthma/COPD Continue bronchodilators  Moderate protein calorie malnutrition Hypoalbuminemia Albumin 2.9, protein supplement to be provided Encourage oral intake    History of dementia PTA only on BuSpar, Aricept and Namenda Continue the same.   Colostomy in situ Continue colostomy care daily  Impaired mobility Development.  Home with PT recommended.  Goals of care   Code Status: Full Code    Scheduled Meds:  busPIRone  5 mg Oral TID   donepezil  10 mg Oral QHS   enoxaparin (LOVENOX) injection  30 mg Subcutaneous Q24H   feeding supplement (GLUCERNA SHAKE)  237 mL Oral TID BM   insulin aspart  0-9 Units Subcutaneous TID WC   memantine  28 mg Oral q morning    PRN meds: acetaminophen **OR** acetaminophen, hydrALAZINE, influenza vaccine adjuvanted, ondansetron **OR** ondansetron (ZOFRAN) IV, pneumococcal 20-valent conjugate vaccine   Infusions:  cefTRIAXone (ROCEPHIN)  IV 1 g (03/16/22 0949)    Skin assessment:     Nutritional status:  Body mass index is 15.7 kg/m.          Diet:  Diet Order             Diet heart healthy/carb modified Room service appropriate? No; Fluid consistency: Thin   Diet effective now                   DVT prophylaxis:  enoxaparin (LOVENOX) injection 30 mg Start: 03/15/22 1000 SCDs Start: 03/15/22 0030   Antimicrobials: IV Rocephin for now.  Pending sensitivity Fluid: None Consultants: None Family Communication: None at bedside   Status is: Observation  Continue in-hospital care because: Pending culture sensitivity Level of care: Med-Surg   Dispo: The patient is from: Home              Anticipated d/c is to: Home with home and PT hopefully tomorrow              Patient currently is not medically stable to d/c.   Difficult to place patient No    Antimicrobials: Anti-infectives (From admission, onward)    Start     Dose/Rate Route Frequency Ordered Stop   03/15/22 1000  cefTRIAXone (ROCEPHIN) 1 g in sodium chloride 0.9 % 100 mL IVPB        1 g 200 mL/hr over 30 Minutes Intravenous Every 24 hours 03/15/22 0006     03/14/22 2300  cefTRIAXone (ROCEPHIN) 1 g in sodium chloride 0.9 % 100 mL IVPB        1 g 200 mL/hr over 30 Minutes Intravenous  Once 03/14/22 2247 03/15/22 0121       Objective: Vitals:   03/16/22 0400 03/16/22 0930  BP: (!) 158/67 (!) 153/64  Pulse: (!) 59 (!) 59  Resp: 18 16  Temp: 97.7 F (36.5 C) 97.8 F (36.6 C)  SpO2: 100% 100%    Intake/Output Summary (Last 24 hours) at 03/16/2022 1026 Last data filed at 03/16/2022 0918 Gross per 24 hour  Intake 1258.53 ml  Output 350 ml  Net 908.53 ml   Filed Weights   03/14/22 1836 03/15/22 1349  Weight: 54 kg 41.5 kg   Weight change: -12.5 kg Body mass index is 15.7 kg/m.   Physical Exam: General exam: Pleasant, elderly African-American female.  Not in physical distress Skin: No rashes, lesions or ulcers. HEENT: Atraumatic, normocephalic, no obvious bleeding Lungs: Clear to auscultation bilaterally CVS: Regular rate and rhythm, no murmur GI/Abd soft, nontender, nondistended, bowel sound present, colostomy status CNS: Alert, awake, oriented x  3 Psychiatry: Mood appropriate Extremities: No pedal edema, no calf tenderness  Data Review: I have personally reviewed the laboratory data and studies available.  F/u labs ordered Unresulted Labs (From admission, onward)     Start     Ordered   03/22/22 0500  Creatinine, serum  (enoxaparin (LOVENOX)    CrCl < 30 ml/min)  Once,   R       Comments: while on enoxaparin therapy.    03/15/22 0031   03/17/22 0500  CBC with Differential/Platelet  Tomorrow morning,   R        03/16/22 1026   03/17/22 9735  Basic metabolic panel  Tomorrow morning,   R        03/16/22 1026   03/15/22 0012  Hemoglobin A1c  Once,   R       Comments: To  assess prior glycemic control    03/15/22 0012   03/14/22 2239  Urine Culture  (Urine Culture)  Once,   URGENT       Question:  Indication  Answer:  Altered mental status (if no other cause identified)   03/14/22 2239            Total time spent in review of labs and imaging, patient evaluation, formulation of plan, documentation and communication with family - 27 minutes  Signed, Terrilee Croak, MD [facility] 03/16/2022

## 2022-03-16 NOTE — TOC Transition Note (Addendum)
Transition of Care [facility]) - CM/SW Discharge Note   Patient Details  Name: [name] MRN: [id number] Date of Birth: [birth year]  [facility] CM/SW Contact:  Curlene Labrum, RN Phone Number: 03/16/2022, 2:08 PM   Clinical Narrative:    CM met with the patient in the hospital room.  No family present in the room at this time.  The patient lives with family at the home.  The patient was provided with Medicare Observation letter.  The patient is not active with home health agency at this time and the patient was provided Medicare choice since the patient's granddaughter was unable to answer phone when called.    I called [facility] and they accepted for home health services.  The patient's daughter spoke with Palliative earlier in the day and was agreeable to Palliative Care through [place].  I called [place] and referral was placed.  Bedside nursing to provide discharge instructions to patient/ family since patient is being discharged home today.  03/16/2021 1445 - CM unable to reach the patient's granddaughter by phone.  I provided in the discharge instructions resources for the elderly since the bedside nurse requested that the granddaughter expressed need for additional assistance in the home outside of home health services.   Final next level of care: [place] Barriers to Discharge: No Barriers Identified   Patient Goals and CMS Choice   Choice offered to / list presented to : Patient  Discharge Placement                         Discharge Plan and Services Additional resources added to the After Visit Summary for     Discharge Planning Services: CM Consult Post Acute Care Choice: Home Health                    HH Arranged: PT [place]: [place] Date [place]: 03/16/22 Time [place]: [phone] Representative spoke with at [place]: Tommi Rumps, [place] with  [place]  Social Determinants of Health ([place]) Interventions [place]: No Food Insecurity (03/15/2022)  Housing: Low Risk  (03/15/2022)  Transportation Needs: No Transportation Needs (03/15/2022)  Utilities: Not At Risk (03/15/2022)  Tobacco Use: Medium Risk (03/14/2022)     Readmission Risk Interventions     No data to display

## 2022-03-16 NOTE — Progress Notes (Signed)
Patient has discharge orders placed at 1355. Multiple attempts made to contact granddaughter by RN, MD and case manager. Granddaughter returned RN's phone call at 60 and RN informed her that the patient was discharging and the resources that CM set them up with. Granddaughter informed RN that she cannot come to the hospital and pick the patient up because she does not drive. RN informed MD and reached out to [facility] on call. TOC on call and AC approved taxi voucher for patient to be transported home via taxi. RN called granddaughter to inform her that the patient would be arriving home via taxi. Granddaughter then informed RN that the patient should not ride in a taxi and that no one will be there to help her up the stairs when she gets home. RN inquired about steps, there are 4 steps to the house.   MD made aware. RN informed to keep patient overnight and to keep discharge orders placed.

## 2022-03-16 NOTE — Progress Notes (Signed)
Daily Progress Note   Patient Name: [name]       Date: 03/16/2022 DOB: 06/05/1933  Age: 89 y.o. MRN#: [id number] Attending Physician: Terrilee Croak, MD Primary Care Physician: Leeroy Cha, MD Admit Date: 03/14/2022  Reason for Consultation/Follow-up: Establishing goals of care  Subjective: I have reviewed medical records including EPIC notes and labs. Received report from primary RN - no acute concerns. RN reports patient ate 90% of breakfast, has been up to chair.   Went to visit patient at bedside - no family/visitors present. Patient was sitting up in chair. She is awake, alert, oriented to person and place only, not able to make complex medical decisions. She is pleasantly confused. No signs or non-verbal gestures of pain or discomfort noted. No respiratory distress, increased work of breathing, or secretions noted. She denies pain. She tells me she "thinks" she remembers working with PT yesterday and that it "went ok."  Called patient's granddaughter/[name] - emotional support provided. Provided updates per my and RN assessment as above. Reviewed information discussed with [place], PA yesterday. [name] still expresses concern about patient returning home with her - it seems she is experiencing caregiver burnout per our discussion. Reviewed concepts and philosophy of hospice and how they can at times offer respite care. Reviewed the difference between home hospice services and respite care per her request. After discussion, [name] is interested in getting more information on this option. She is open to speaking with hospice liaison for discharge options and respite care information - requesting [facility].   We also briefly reviewed options of HHPT and possible  rehab. Discussed that the goal of rehab is improvement/stabalization of functional status,  which can be a difficult goal to meet for patients with advanced illness and multiple medical conditions. Reviewed what is needed for someone to have a positive rehabilitation experience to include adequate nutritional intake as well as willingness/ability to participate. Provided updates on PT's evaluation yesterday with recommendation for HHPT. [name] expressed understanding but also concern about patient coming directly home after discharge.  She is open to PMT follow up for continued Walker after she speaks with hospice liaison.   Reviewed her thoughts and feelings around code status discussion yesterday. Encouraged her to consider DNR/DNI status for patient understanding evidenced based poor outcomes in similar hospitalized patient, as  the cause of arrest is likely associated with advanced chronic/terminal illness rather than an easily reversible acute cardio-pulmonary event.  I shared that even if we pursued resuscitation we would not able to resolve the underlying factors. I explained that DNR/DNI does not change the medical plan and it only comes into effect after a person has arrested (died).  It is a protective measure to keep [place] from harming the patient in their last moments of life. [name] was not agreeable to DNR/DNI with understanding that patient would receive CPR, defibrillation, ACLS medications, or intubation; however, she is considering it.   All questions and concerns addressed. Encouraged to call with questions and/or concerns. PMT number provided.  2:30 PM Noted patient is for discharge today. Called and left a confidential VM for [facility] liaison.  Length of Stay: 0  Current Medications: Scheduled Meds:   busPIRone  5 mg Oral TID   donepezil  10 mg Oral QHS   enoxaparin (LOVENOX) injection  30 mg Subcutaneous Q24H   feeding supplement (GLUCERNA SHAKE)  237 mL Oral TID BM    insulin aspart  0-9 Units Subcutaneous TID WC   memantine  28 mg Oral q morning    Continuous Infusions:  cefTRIAXone (ROCEPHIN)  IV 1 g (03/16/22 0949)    PRN Meds: acetaminophen **OR** acetaminophen, hydrALAZINE, influenza vaccine adjuvanted, ondansetron **OR** ondansetron (ZOFRAN) IV, pneumococcal 20-valent conjugate vaccine  Physical Exam Vitals and nursing note reviewed.  Constitutional:      General: She is not in acute distress. Pulmonary:     Effort: No respiratory distress.  Skin:    General: Skin is warm and dry.  Neurological:     Mental Status: She is alert. She is disoriented and confused.     Motor: Weakness present.  Psychiatric:        Attention and Perception: Attention normal.        Behavior: Behavior is cooperative.        Cognition and Memory: Cognition is impaired. Memory is impaired.             Vital Signs: BP (!) 153/64 (BP Location: Right Arm)   Pulse (!) 59   Temp 97.8 F (36.6 C) (Oral)   Resp 16   Ht 5\' 4"  (1.626 m)   Wt 41.5 kg   SpO2 100%   BMI 15.70 kg/m  SpO2: SpO2: 100 % O2 Device: O2 Device: Room Air O2 Flow Rate:    Intake/output summary:  Intake/Output Summary (Last 24 hours) at 03/16/2022 1215 Last data filed at 03/16/2022 1213 Gross per 24 hour  Intake 1258.53 ml  Output 800 ml  Net 458.53 ml   LBM: Last BM Date : 03/15/22 Baseline Weight: Weight: 54 kg Most recent weight: Weight: 41.5 kg       Palliative Assessment/Data: PPS 40-50%      Patient Active Problem List   Diagnosis Date Noted   Hypoalbuminemia due to protein-calorie malnutrition ([place]) 03/15/2022   Prediabetes 03/15/2022   Syncope 02/27/2022   Hypomagnesemia 02/26/2022   Altered sensorium 08/29/2021   Chronic diastolic CHF (congestive heart failure) ([place]) 08/28/2021   Episode of altered consciousness 08/27/2021   Malnutrition of moderate degree 08/30/2020   Protein-calorie malnutrition, severe 08/30/2020   Acute kidney injury superimposed on chronic  kidney disease ([place]) 08/29/2020   Dehydration 08/29/2020   Hyperkalemia 08/29/2020   Macrocytic anemia 08/29/2020   COVID-19 virus infection 04/07/2020   Pacemaker 12/21/2019   UTI (urinary tract infection) 05/11/2019  ARF (acute renal failure) ([place]) 05/10/2019   Acute lower UTI 05/10/2019   Symptomatic bradycardia 05/01/2018   Near syncope 05/01/2018   Chronic kidney disease, stage 3b ([place]) 05/01/2018   Influenza B 04/10/2018   Generalized weakness 04/10/2018   Essential hypertension 03/01/2018   Asthma with chronic obstructive pulmonary disease (COPD) 03/01/2018   Bradycardia 12/15/2017   AKI (acute kidney injury) ([place]) 07/18/2017   Colostomy present ([place]) 11/08/2016   Syncope and collapse 10/08/2016   Abnormal chest x-ray 10/08/2016   Lethargy 10/08/2016   Dementia without behavioral disturbance ([place])    Sepsis ([place]) 09/05/2016   Community acquired pneumonia 09/05/2016   Acute encephalopathy 09/05/2016   Diabetes mellitus with complication ([place]) 56/38/7564   Nausea & vomiting 04/09/2012   Gallstones 03/15/2011   Partial SBO 03/15/2011   Colostomy hernia ([place]) 03/15/2011   Hypercalcemia 02/28/2007   IRON DEFIC ANEMIA [place] DIET IRON INTAKE 02/28/2007   DISCITIS 02/28/2007   OSTEOMYELITIS 02/28/2007   CHILLS WITHOUT FEVER 02/28/2007    Palliative Care Assessment & Plan   Patient Profile: 89 y.o. female  with past medical history of hypertension, chronic diastolic heart failure, asthma/COPD, type 2 diabetes mellitus, CKD 3B, sinus bradycardia s/p pacemaker, dementia, s/p colostomy admitted on 03/14/2022 with weakness.    Patient has had 4 ED visits in the past 6 months and 2 admissions, with current admission within 30 days of most recent admission.  Current admission for UTI and AKI on CKD 4.  PMT has been consulted to assist with goals of care conversation.  Assessment: Principal Problem:   UTI (urinary tract infection) Active Problems:   Dementia without behavioral  disturbance (HCC)   Colostomy present ([place])   Essential hypertension   Asthma with chronic obstructive pulmonary disease (COPD)   Acute kidney injury superimposed on chronic kidney disease ([place])   Hypomagnesemia   Hypoalbuminemia due to protein-calorie malnutrition ([place])   Prediabetes   Recommendations/Plan: Continue full code/full scope Granddaughter is considering DNR/DNI Granddaughter is open to speaking with [place] about hospice services/respite care - TOC and hospice liaison notified Home with HHPT for now Patient discharging today - no further PMT needs  Goals of Care and Additional Recommendations: Limitations on Scope of Treatment: Full Scope Treatment  Code Status:    Code Status Orders  (From admission, onward)           Start     Ordered   03/15/22 0030  Full code  Continuous       Question:  By:  Answer:  Consent: discussion documented in EHR   03/15/22 0031           Code Status History     Date Active Date Inactive Code Status Order ID Comments User Context   02/26/2022 0031 02/28/2022 2357 Full Code [id number]  Orene Desanctis, DO ED   08/28/2021 0515 08/31/2021 0324 DNR [id number]  Vernelle Emerald, MD ED   08/31/2020 1223 09/07/2020 0945 DNR [id number]  Rosezella Rumpf, NP Inpatient   08/29/2020 1519 08/31/2020 1223 Full Code [id number]  Norval Morton, MD ED   04/07/2020 0216 04/11/2020 2100 Full Code [id number]  Etta Quill, DO ED   05/10/2019 2056 05/14/2019 0643 Full Code [id number]  Rise Patience, MD ED   05/01/2018 1634 05/03/2018 1806 Full Code [id number]  Karmen Bongo, MD ED   04/10/2018 0314 04/11/2018 1856 Full Code [id number]  Etta Quill, DO ED   07/18/2017 1735 07/19/2017 2009 Full Code [id number]  Hosie Poisson, MD Inpatient   11/08/2016 0401 11/09/2016 1857 Full Code [id number]  Etta Quill, DO ED   10/08/2016 1616 10/09/2016 1924 Full Code [id number]  Radene Gunning, NP ED   09/05/2016 0555 09/09/2016 1833 Full Code  [id number]  Norval Morton, MD ED   04/09/2012 1813 04/12/2012 1636 Full Code [id number]  Dagoberto Ligas, RN Inpatient   03/15/2011 0936 03/19/2011 2005 Full Code [id number]  Aniceto Boss, RN Inpatient       Prognosis:  Poor in the setting of advanced age, dementia, recurrent hospitalizations, and multiple comorbidities  Discharge Planning: Home with Home Health and outpatient [place] was discussed with primary RN, patient's granddaughter, TOC, Dr. Pietro Cassis, hospice liaison  Thank you for allowing the Palliative Medicine Team to assist in the care of this patient.   Lin Landsman, NP  Please contact Palliative Medicine Team phone at [phone] for questions and concerns.   *Portions of this note are a verbal dictation therefore any spelling and/or grammatical errors are due to the "[place]" system interpretation.

## 2022-03-16 NOTE — Care Management Obs Status (Cosign Needed)
[place] NOTIFICATION   Patient Details  Name: [name] MRN: [id number] Date of Birth: 1934-01-17   Medicare Observation Status Notification Given:  Yes    Curlene Labrum, RN 03/16/2022, 9:07 AM

## 2022-03-16 NOTE — Evaluation (Signed)
Physical Therapy Evaluation/ Discharge Patient Details Name: [name] MRN: [id number] DOB: [birth year] Today's Date: 03/16/2022  History of Present Illness  89 yo female admitted 12/30 with weakness, UTI, AKI. PMhx: Dementia, sinus bradycardia s/p PPM, HTN, chronic dCHF, asthma/COPD, T2DM, CKD, colostomy  Clinical Impression  Pt pleasant and familiar from prior admission. Pt with assist of family at baseline and per chair family concerned with home function and would benefit from [place]. Pt near baseline and will defer further acute therapy needs at this time with daily mobility recommended via nursing and mobility specialists. Pt with decreased cognition, strength and balance with HHPT recommended.        Recommendations for follow up therapy are one component of a multi-disciplinary discharge planning process, led by the attending physician.  Recommendations may be updated based on patient status, additional functional criteria and insurance authorization.  Follow Up Recommendations Home health PT      Assistance Recommended at Discharge Frequent or constant Supervision/Assistance  Patient can return home with the following  A little help with walking and/or transfers;A little help with bathing/dressing/bathroom;Assistance with cooking/housework;Direct supervision/assist for medications management;Assist for transportation;Direct supervision/assist for financial management;Help with stairs or ramp for entrance    Equipment Recommendations None recommended by PT  Recommendations for Other Services       Functional Status Assessment Patient has had a recent decline in their functional status and/or demonstrates limited ability to make significant improvements in function in a reasonable and predictable amount of time     Precautions / Restrictions Precautions Precautions: Fall      Mobility  Bed Mobility Overal bed mobility: Needs Assistance Bed Mobility: Supine to Sit      Supine to sit: Supervision     General bed mobility comments: supervision for safety    Transfers Overall transfer level: Needs assistance   Transfers: Sit to/from Stand Sit to Stand: Min guard           General transfer comment: guarding for safety to rise from bed and BSC    Ambulation/Gait Ambulation/Gait assistance: Min guard Gait Distance (Feet): 150 Feet Assistive device: Rolling walker (2 wheels) Gait Pattern/deviations: Step-through pattern, Decreased stride length, Trunk flexed   Gait velocity interpretation: 1.31 - 2.62 ft/sec, indicative of limited community ambulator   General Gait Details: cues for direction and proximity to RW, pt with kyphotic posture and maintains self posterior to [employer]            Wheelchair Mobility    Modified Rankin (Stroke Patients Only)       Balance Overall balance assessment: Needs assistance   Sitting balance-Leahy Scale: Fair Sitting balance - Comments: no UE support   Standing balance support: Reliant on assistive device for balance, Bilateral upper extremity supported Standing balance-Leahy Scale: Poor Standing balance comment: RW for standing able to static stand at sink without UE support                             Pertinent Vitals/Pain Pain Assessment Pain Assessment: No/denies pain    Home Living Family/patient expects to be discharged to:: Private residence Living Arrangements: Children Available Help at Discharge: Family;Available 24 hours/day Type of Home: House Home Access: Stairs to enter   [employer] of Steps: 3   Home Layout: One level Home Equipment: Conservation officer, nature (2 wheels);Cane - single point      Prior Function Prior Level of Function : Needs assist  Mobility Comments: Uses RW for mobility tasks ADLs Comments: Daughter assists with ADL tasks, family does IADLs     Hand Dominance        Extremity/Trunk Assessment   Upper  Extremity Assessment Upper Extremity Assessment: Generalized weakness    Lower Extremity Assessment Lower Extremity Assessment: Generalized weakness    Cervical / Trunk Assessment Cervical / Trunk Assessment: Kyphotic  Communication   Communication: No difficulties  Cognition Arousal/Alertness: Awake/alert Behavior During Therapy: WFL for tasks assessed/performed Overall Cognitive Status: No family/caregiver present to determine baseline cognitive functioning                                 General Comments: history of dementia. oriented to hospital, month and self        General Comments      Exercises     Assessment/Plan    PT Assessment All further PT needs can be met in the next venue of care  PT Problem List Decreased mobility;Decreased activity tolerance;Decreased cognition;Decreased balance       PT Treatment Interventions      PT Goals (Current goals can be found in the Care Plan section)  Acute Rehab PT Goals PT Goal Formulation: All assessment and education complete, DC therapy    Frequency       Co-evaluation               AM-PAC PT "6 Clicks" Mobility  Outcome Measure Help needed turning from your back to your side while in a flat bed without using bedrails?: A Little Help needed moving from lying on your back to sitting on the side of a flat bed without using bedrails?: A Little Help needed moving to and from a bed to a chair (including a wheelchair)?: A Little Help needed standing up from a chair using your arms (e.g., wheelchair or bedside chair)?: A Little Help needed to walk in hospital room?: A Little Help needed climbing 3-5 steps with a railing? : A Little 6 Click Score: 18    End of Session Equipment Utilized During Treatment: Gait belt Activity Tolerance: Patient tolerated treatment well Patient left: in chair;with call bell/phone within reach;with chair alarm set Nurse Communication: Mobility status PT Visit  Diagnosis: Other abnormalities of gait and mobility (R26.89)    Time: 3235-5732 PT Time Calculation (min) (ACUTE ONLY): 19 min   Charges:   PT Evaluation $PT Eval Low Complexity: 1 Low          Josslin Sanjuan P, PT Acute Rehabilitation Services Office: [place] [facility] Markevion Lattin 03/16/2022, 8:56 AM

## 2022-03-17 DIAGNOSIS — N3000 Acute cystitis without hematuria: Secondary | ICD-10-CM | POA: Diagnosis not present

## 2022-03-17 DIAGNOSIS — Z515 Encounter for palliative care: Secondary | ICD-10-CM | POA: Diagnosis not present

## 2022-03-17 DIAGNOSIS — N179 Acute kidney failure, unspecified: Secondary | ICD-10-CM | POA: Diagnosis not present

## 2022-03-17 DIAGNOSIS — Z789 Other specified health status: Secondary | ICD-10-CM | POA: Diagnosis not present

## 2022-03-17 LAB — CBC WITH DIFFERENTIAL/PLATELET
Abs Immature Granulocytes: 0.01 10*3/uL (ref 0.00–0.07)
Basophils Absolute: 0 10*3/uL (ref 0.0–0.1)
Basophils Relative: 1 %
Eosinophils Absolute: 0.2 10*3/uL (ref 0.0–0.5)
Eosinophils Relative: 3 %
HCT: 29.5 % — ABNORMAL LOW (ref 36.0–46.0)
Hemoglobin: 9.5 g/dL — ABNORMAL LOW (ref 12.0–15.0)
Immature Granulocytes: 0 %
Lymphocytes Relative: 29 %
Lymphs Abs: 1.5 10*3/uL (ref 0.7–4.0)
MCH: 30.9 pg (ref 26.0–34.0)
MCHC: 32.2 g/dL (ref 30.0–36.0)
MCV: 96.1 fL (ref 80.0–100.0)
Monocytes Absolute: 0.5 10*3/uL (ref 0.1–1.0)
Monocytes Relative: 10 %
Neutro Abs: 3 10*3/uL (ref 1.7–7.7)
Neutrophils Relative %: 57 %
Platelets: 157 10*3/uL (ref 150–400)
RBC: 3.07 MIL/uL — ABNORMAL LOW (ref 3.87–5.11)
RDW: 13.2 % (ref 11.5–15.5)
WBC: 5.2 10*3/uL (ref 4.0–10.5)
nRBC: 0 % (ref 0.0–0.2)

## 2022-03-17 LAB — BASIC METABOLIC PANEL
Anion gap: 11 (ref 5–15)
BUN: 39 mg/dL — ABNORMAL HIGH (ref 8–23)
CO2: 24 mmol/L (ref 22–32)
Calcium: 8.6 mg/dL — ABNORMAL LOW (ref 8.9–10.3)
Chloride: 105 mmol/L (ref 98–111)
Creatinine, Ser: 2.05 mg/dL — ABNORMAL HIGH (ref 0.44–1.00)
GFR, Estimated: 23 mL/min — ABNORMAL LOW (ref >60)
Glucose, Bld: 114 mg/dL — ABNORMAL HIGH (ref 70–99)
Potassium: 4.7 mmol/L (ref 3.5–5.1)
Sodium: 140 mmol/L (ref 135–145)

## 2022-03-17 LAB — GLUCOSE, CAPILLARY
Glucose-Capillary: 106 mg/dL — ABNORMAL HIGH (ref 70–99)
Glucose-Capillary: 136 mg/dL — ABNORMAL HIGH (ref 70–99)
Glucose-Capillary: 100 mg/dL — ABNORMAL HIGH (ref 70–99)

## 2022-03-17 LAB — HEMOGLOBIN A1C
Hgb A1c MFr Bld: 6.4 % — ABNORMAL HIGH (ref 4.8–5.6)
Mean Plasma Glucose: 137 mg/dL

## 2022-03-17 NOTE — Progress Notes (Addendum)
12:55pm: CSW spoke with [name] again who states the neighbor who was going to help has not returned home. [name] agreeable to receive patient via [place] transportation this afternoon.  CSW spoke with Sammy at [place] to schedule transportation. Sammy states a driver will arrive at the main entrance / [employer] around [address]. The driver will call the unit directly 15 minutes prior to arrival to notify RN of arrival.  10am: CSW spoke with patient's granddaughter [name] who states she is home and can receive the patient but states she cannot assist the patient in getting into the house. [name] states there is a neighbor that is available that can assist the patient in getting into the home once discharged home via cab. [name] states the neighbor will be available to assist after 12pm.  Madilyn Fireman, MSW, LCSW Transitions of Care  Clinical Social Worker II [phone]

## 2022-03-17 NOTE — Progress Notes (Signed)
Attempted to return phone call to [facility] no response left a voicemail if she needed anything just return the call.

## 2022-03-17 NOTE — Progress Notes (Signed)
Patient was prepared for discharge home yesterday.  He could not happen because her granddaughter did not have help to take her up the steps at home. She has arranged for neighbors to help today. Okay to discharge today. Briefly seen and examined this morning.  No other change in plan. [facility] will not bill for today.

## 2022-03-17 NOTE — Progress Notes (Signed)
Second attempt to call [name] Case management states she needed to talk to the nurse

## 2022-03-17 NOTE — Progress Notes (Signed)
Daily Progress Note   Patient Name: [name]       Date: 03/17/2022 DOB: Oct 24, 1933  Age: 89 y.o. MRN#: [id number] Attending Physician: Terrilee Croak, MD Primary Care Physician: Leeroy Cha, MD Admit Date: 03/14/2022  Reason for Consultation/Follow-up: Establishing goals of care  Subjective: I have reviewed medical records including EPIC notes and labs. Received report from primary RN - no acute concerns. RN reports patient did not discharge yesterday due to family not available to meet patient at the home. Per RN, patient has a good appetite.  Went to visit patient at bedside - no family/visitors present. Patient was lying in bed awake, alert, disoriented, and able to participate in simple conversation. No signs or non-verbal gestures of pain or discomfort noted. No respiratory distress, increased work of breathing, or secretions noted. She denies pain.   Spoke with [facility] - she has spoken with granddaughter and a home hospice visit has been scheduled.  Patient remains medically stable for discharge.  Length of Stay: 0  Current Medications: Scheduled Meds:   busPIRone  5 mg Oral TID   donepezil  10 mg Oral QHS   enoxaparin (LOVENOX) injection  30 mg Subcutaneous Q24H   feeding supplement (GLUCERNA SHAKE)  237 mL Oral TID BM   insulin aspart  0-9 Units Subcutaneous TID WC   memantine  28 mg Oral q morning    Continuous Infusions:  cefTRIAXone (ROCEPHIN)  IV 1 g (03/17/22 0900)    PRN Meds: acetaminophen **OR** acetaminophen, hydrALAZINE, influenza vaccine adjuvanted, ondansetron **OR** ondansetron (ZOFRAN) IV, pneumococcal 20-valent conjugate vaccine  Physical Exam Vitals and nursing note reviewed.  Constitutional:      General: She is  not in acute distress. Pulmonary:     Effort: No respiratory distress.  Skin:    General: Skin is warm and dry.  Neurological:     Mental Status: She is alert. She is disoriented and confused.     Motor: Weakness present.  Psychiatric:        Attention and Perception: Attention normal.        Behavior: Behavior is cooperative.        Cognition and Memory: Cognition is impaired. Memory is impaired.             Vital Signs: BP (!) 144/69 (BP  Location: Right Arm)   Pulse 60   Temp 98.4 F (36.9 C) (Oral)   Resp 18   Ht 5\' 4"  (1.626 m)   Wt 41.5 kg   SpO2 98%   BMI 15.70 kg/m  SpO2: SpO2: 98 % O2 Device: O2 Device: Room Air O2 Flow Rate:    Intake/output summary:  Intake/Output Summary (Last 24 hours) at 03/17/2022 1025 Last data filed at 03/17/2022 0537 Gross per 24 hour  Intake 574 ml  Output 1625 ml  Net -1051 ml   LBM: Last BM Date : 03/16/22 Baseline Weight: Weight: 54 kg Most recent weight: Weight: 41.5 kg       Palliative Assessment/Data: PPS 50%      Patient Active Problem List   Diagnosis Date Noted   Hypoalbuminemia due to protein-calorie malnutrition ([place]) 03/15/2022   Prediabetes 03/15/2022   Syncope 02/27/2022   Hypomagnesemia 02/26/2022   Altered sensorium 08/29/2021   Chronic diastolic CHF (congestive heart failure) ([place]) 08/28/2021   Episode of altered consciousness 08/27/2021   Malnutrition of moderate degree 08/30/2020   Protein-calorie malnutrition, severe 08/30/2020   Acute kidney injury superimposed on chronic kidney disease ([place]) 08/29/2020   Dehydration 08/29/2020   Hyperkalemia 08/29/2020   Macrocytic anemia 08/29/2020   COVID-19 virus infection 04/07/2020   Pacemaker 12/21/2019   UTI (urinary tract infection) 05/11/2019   ARF (acute renal failure) ([place]) 05/10/2019   Acute lower UTI 05/10/2019   Symptomatic bradycardia 05/01/2018   Near syncope 05/01/2018   Chronic kidney disease, stage 3b ([place]) 05/01/2018   Influenza B 04/10/2018    Generalized weakness 04/10/2018   Essential hypertension 03/01/2018   Asthma with chronic obstructive pulmonary disease (COPD) 03/01/2018   Bradycardia 12/15/2017   AKI (acute kidney injury) ([place]) 07/18/2017   Colostomy present ([place]) 11/08/2016   Syncope and collapse 10/08/2016   Abnormal chest x-ray 10/08/2016   Lethargy 10/08/2016   Dementia without behavioral disturbance ([place])    Sepsis ([place]) 09/05/2016   Community acquired pneumonia 09/05/2016   Acute encephalopathy 09/05/2016   Diabetes mellitus with complication ([place]) 02/54/2706   Nausea & vomiting 04/09/2012   Gallstones 03/15/2011   Partial SBO 03/15/2011   Colostomy hernia ([place]) 03/15/2011   Hypercalcemia 02/28/2007   IRON DEFIC ANEMIA [place] DIET IRON INTAKE 02/28/2007   DISCITIS 02/28/2007   OSTEOMYELITIS 02/28/2007   CHILLS WITHOUT FEVER 02/28/2007    Palliative Care Assessment & Plan   Patient Profile: 89 y.o. female  with past medical history of hypertension, chronic diastolic heart failure, asthma/COPD, type 2 diabetes mellitus, CKD 3B, sinus bradycardia s/p pacemaker, dementia, s/p colostomy admitted on 03/14/2022 with weakness.    Patient has had 4 ED visits in the past 6 months and 2 admissions, with current admission within 30 days of most recent admission.  Current admission for UTI and AKI on CKD 4.  PMT has been consulted to assist with goals of care conversation.  Assessment: Principal Problem:   UTI (urinary tract infection) Active Problems:   Dementia without behavioral disturbance (HCC)   Colostomy present ([place])   Essential hypertension   Asthma with chronic obstructive pulmonary disease (COPD)   Acute kidney injury superimposed on chronic kidney disease (HCC)   Hypomagnesemia   Hypoalbuminemia due to protein-calorie malnutrition (HCC)   Prediabetes   Recommendations/Plan: Continue full code/full scope Granddaughter has scheduled a home hospice visit with [facility] after  patient's discharge Patient discharging today if family available to meet her  Goals of Care and Additional Recommendations:  Limitations on Scope of Treatment: Full Scope Treatment  Code Status:    Code Status Orders  (From admission, onward)           Start     Ordered   03/15/22 0030  Full code  Continuous       Question:  By:  Answer:  Consent: discussion documented in EHR   03/15/22 0031           Code Status History     Date Active Date Inactive Code Status Order ID Comments User Context   02/26/2022 0031 02/28/2022 2357 Full Code [id number]  Orene Desanctis, DO ED   08/28/2021 0515 08/31/2021 0324 DNR [id number]  Vernelle Emerald, MD ED   08/31/2020 1223 09/07/2020 0945 DNR [id number]  Rosezella Rumpf, NP Inpatient   08/29/2020 1519 08/31/2020 1223 Full Code [id number]  Norval Morton, MD ED   04/07/2020 0216 04/11/2020 2100 Full Code [id number]  Etta Quill, DO ED   05/10/2019 2056 05/14/2019 0643 Full Code [id number]  Rise Patience, MD ED   05/01/2018 1634 05/03/2018 1806 Full Code [id number]  Karmen Bongo, MD ED   04/10/2018 0314 04/11/2018 1856 Full Code [id number]  Etta Quill, DO ED   07/18/2017 1735 07/19/2017 2009 Full Code [id number]  Hosie Poisson, MD Inpatient   11/08/2016 0401 11/09/2016 1857 Full Code [id number]  Etta Quill, DO ED   10/08/2016 1616 10/09/2016 1924 Full Code [id number]  Radene Gunning, NP ED   09/05/2016 0555 09/09/2016 1833 Full Code [id number]  Norval Morton, MD ED   04/09/2012 1813 04/12/2012 1636 Full Code [id number]  Dagoberto Ligas, RN Inpatient   03/15/2011 0936 03/19/2011 2005 Full Code [id number]  Aniceto Boss, RN Inpatient       Prognosis:  Poor in the setting of advanced age, dementia, recurrent hospitalizations, and multiple comorbidities   Discharge Planning: Home with Home Health and will possibly transition to hospice  Care plan was discussed with primary RN  Thank you for allowing the Palliative Medicine Team to  assist in the care of this patient.   Total Time 25 minutes Prolonged Time Billed  no       Greater than 50%  of this time was spent counseling and coordinating care related to the above assessment and plan.  Lin Landsman, NP  Please contact Palliative Medicine Team phone at [phone] for questions and concerns.   *Portions of this note are a verbal dictation therefore any spelling and/or grammatical errors are due to the "[employer]" system interpretation.

## 2022-03-17 NOTE — Progress Notes (Signed)
Patient is ready for discharge IV is removed and colostomy bag is empty. Her ride is scheduled for 5pm. Daughter will be home when she arrives.

## 2022-03-17 NOTE — Plan of Care (Signed)
  Problem: Education: Goal: Knowledge of condition and prescribed therapy will improve Outcome: Adequate for Discharge   Problem: Cardiac: Goal: Will achieve and/or maintain adequate cardiac output Outcome: Adequate for Discharge   Problem: Physical Regulation: Goal: Complications related to the disease process, condition or treatment will be avoided or minimized Outcome: Adequate for Discharge   Problem: Education: Goal: Ability to describe self-care measures that may prevent or decrease complications (Diabetes Survival Skills Education) will improve Outcome: Adequate for Discharge Goal: Individualized Educational Video(s) Outcome: Adequate for Discharge   Problem: Coping: Goal: Ability to adjust to condition or change in health will improve Outcome: Adequate for Discharge   Problem: Fluid Volume: Goal: Ability to maintain a balanced intake and output will improve Outcome: Adequate for Discharge   Problem: Health Behavior/Discharge Planning: Goal: Ability to identify and utilize available resources and services will improve Outcome: Adequate for Discharge Goal: Ability to manage health-related needs will improve Outcome: Adequate for Discharge   Problem: Metabolic: Goal: Ability to maintain appropriate glucose levels will improve Outcome: Adequate for Discharge   Problem: Nutritional: Goal: Maintenance of adequate nutrition will improve Outcome: Adequate for Discharge Goal: Progress toward achieving an optimal weight will improve Outcome: Adequate for Discharge   Problem: Skin Integrity: Goal: Risk for impaired skin integrity will decrease Outcome: Adequate for Discharge   Problem: Tissue Perfusion: Goal: Adequacy of tissue perfusion will improve Outcome: Adequate for Discharge   Problem: Education: Goal: Knowledge of General Education information will improve Description: Including pain rating scale, medication(s)/side effects and non-pharmacologic comfort  measures Outcome: Adequate for Discharge   Problem: Health Behavior/Discharge Planning: Goal: Ability to manage health-related needs will improve Outcome: Adequate for Discharge   Problem: Clinical Measurements: Goal: Ability to maintain clinical measurements within normal limits will improve Outcome: Adequate for Discharge Goal: Will remain free from infection Outcome: Adequate for Discharge Goal: Diagnostic test results will improve Outcome: Adequate for Discharge Goal: Respiratory complications will improve Outcome: Adequate for Discharge Goal: Cardiovascular complication will be avoided Outcome: Adequate for Discharge   Problem: Activity: Goal: Risk for activity intolerance will decrease Outcome: Adequate for Discharge   Problem: Nutrition: Goal: Adequate nutrition will be maintained Outcome: Adequate for Discharge   Problem: Coping: Goal: Level of anxiety will decrease Outcome: Adequate for Discharge   Problem: Elimination: Goal: Will not experience complications related to bowel motility Outcome: Adequate for Discharge Goal: Will not experience complications related to urinary retention Outcome: Adequate for Discharge   Problem: Pain Managment: Goal: General experience of comfort will improve Outcome: Adequate for Discharge   Problem: Safety: Goal: Ability to remain free from injury will improve Outcome: Adequate for Discharge   Problem: Skin Integrity: Goal: Risk for impaired skin integrity will decrease Outcome: Adequate for Discharge

## 2022-03-18 ENCOUNTER — Emergency Department (HOSPITAL_COMMUNITY)
Admission: EM | Admit: 2022-03-18 | Discharge: 2022-03-19 | Disposition: A | Discharge status: Home / Self Care | Payer: Medicare Other | Attending: Emergency Medicine | Admitting: Emergency Medicine

## 2022-03-18 ENCOUNTER — Emergency Department (HOSPITAL_COMMUNITY): Payer: Medicare Other

## 2022-03-18 DIAGNOSIS — N189 Chronic kidney disease, unspecified: Secondary | ICD-10-CM | POA: Insufficient documentation

## 2022-03-18 DIAGNOSIS — R944 Abnormal results of kidney function studies: Secondary | ICD-10-CM | POA: Insufficient documentation

## 2022-03-18 DIAGNOSIS — R0600 Dyspnea, unspecified: Secondary | ICD-10-CM | POA: Diagnosis not present

## 2022-03-18 DIAGNOSIS — R519 Headache, unspecified: Secondary | ICD-10-CM | POA: Diagnosis present

## 2022-03-18 DIAGNOSIS — I129 Hypertensive chronic kidney disease with stage 1 through stage 4 chronic kidney disease, or unspecified chronic kidney disease: Secondary | ICD-10-CM | POA: Diagnosis not present

## 2022-03-18 DIAGNOSIS — Z79899 Other long term (current) drug therapy: Secondary | ICD-10-CM | POA: Insufficient documentation

## 2022-03-18 DIAGNOSIS — Z743 Need for continuous supervision: Secondary | ICD-10-CM | POA: Diagnosis not present

## 2022-03-18 DIAGNOSIS — R0902 Hypoxemia: Secondary | ICD-10-CM | POA: Diagnosis not present

## 2022-03-18 DIAGNOSIS — R55 Syncope and collapse: Secondary | ICD-10-CM | POA: Insufficient documentation

## 2022-03-18 DIAGNOSIS — R4182 Altered mental status, unspecified: Secondary | ICD-10-CM | POA: Insufficient documentation

## 2022-03-18 DIAGNOSIS — R6889 Other general symptoms and signs: Secondary | ICD-10-CM | POA: Diagnosis not present

## 2022-03-18 DIAGNOSIS — R404 Transient alteration of awareness: Secondary | ICD-10-CM | POA: Diagnosis not present

## 2022-03-18 LAB — I-STAT CHEM 8, ED
BUN: 44 mg/dL — ABNORMAL HIGH (ref 8–23)
Calcium, Ion: 1.23 mmol/L (ref 1.15–1.40)
Chloride: 106 mmol/L (ref 98–111)
Creatinine, Ser: 2.1 mg/dL — ABNORMAL HIGH (ref 0.44–1.00)
Glucose, Bld: 128 mg/dL — ABNORMAL HIGH (ref 70–99)
HCT: 32 % — ABNORMAL LOW (ref 36.0–46.0)
Hemoglobin: 10.9 g/dL — ABNORMAL LOW (ref 12.0–15.0)
Potassium: 5 mmol/L (ref 3.5–5.1)
Sodium: 142 mmol/L (ref 135–145)
TCO2: 28 mmol/L (ref 22–32)

## 2022-03-18 LAB — DIFFERENTIAL
Abs Immature Granulocytes: 0.02 10*3/uL (ref 0.00–0.07)
Basophils Absolute: 0 10*3/uL (ref 0.0–0.1)
Basophils Relative: 1 %
Eosinophils Absolute: 0.1 10*3/uL (ref 0.0–0.5)
Eosinophils Relative: 1 %
Immature Granulocytes: 0 %
Lymphocytes Relative: 15 %
Lymphs Abs: 1 10*3/uL (ref 0.7–4.0)
Monocytes Absolute: 0.4 10*3/uL (ref 0.1–1.0)
Monocytes Relative: 7 %
Neutro Abs: 5 10*3/uL (ref 1.7–7.7)
Neutrophils Relative %: 76 %

## 2022-03-18 LAB — CBC
HCT: 33.9 % — ABNORMAL LOW (ref 36.0–46.0)
Hemoglobin: 10.6 g/dL — ABNORMAL LOW (ref 12.0–15.0)
MCH: 30.5 pg (ref 26.0–34.0)
MCHC: 31.3 g/dL (ref 30.0–36.0)
MCV: 97.7 fL (ref 80.0–100.0)
Platelets: 200 10*3/uL (ref 150–400)
RBC: 3.47 MIL/uL — ABNORMAL LOW (ref 3.87–5.11)
RDW: 13.4 % (ref 11.5–15.5)
WBC: 6.6 10*3/uL (ref 4.0–10.5)
nRBC: 0 % (ref 0.0–0.2)

## 2022-03-18 LAB — COMPREHENSIVE METABOLIC PANEL
ALT: 9 U/L (ref 0–44)
AST: 21 U/L (ref 15–41)
Albumin: 3.1 g/dL — ABNORMAL LOW (ref 3.5–5.0)
Alkaline Phosphatase: 52 U/L (ref 38–126)
Anion gap: 6 (ref 5–15)
BUN: 43 mg/dL — ABNORMAL HIGH (ref 8–23)
CO2: 27 mmol/L (ref 22–32)
Calcium: 9 mg/dL (ref 8.9–10.3)
Chloride: 106 mmol/L (ref 98–111)
Creatinine, Ser: 1.73 mg/dL — ABNORMAL HIGH (ref 0.44–1.00)
GFR, Estimated: 28 mL/min — ABNORMAL LOW (ref >60)
Glucose, Bld: 129 mg/dL — ABNORMAL HIGH (ref 70–99)
Potassium: 5.2 mmol/L — ABNORMAL HIGH (ref 3.5–5.1)
Sodium: 139 mmol/L (ref 135–145)
Total Bilirubin: 0.5 mg/dL (ref 0.3–1.2)
Total Protein: 6.5 g/dL (ref 6.5–8.1)

## 2022-03-18 LAB — TROPONIN I (HIGH SENSITIVITY): Troponin I (High Sensitivity): 5 ng/L (ref <18)

## 2022-03-18 LAB — ETHANOL: Alcohol, Ethyl (B): <10 mg/dL (ref <10)

## 2022-03-18 LAB — PROTIME-INR
INR: 1 (ref 0.8–1.2)
Prothrombin Time: 13 seconds (ref 11.4–15.2)

## 2022-03-18 LAB — APTT: aPTT: 25 seconds (ref 24–36)

## 2022-03-18 MED ORDER — SODIUM CHLORIDE 0.9 % IV SOLN: 100.0000 mL/h | INTRAVENOUS | Status: DC

## 2022-03-18 MED ORDER — SODIUM CHLORIDE 0.9 % IV BOLUS
500.0000 mL | Freq: Once | INTRAVENOUS | Status: AC
  Administered 2022-03-19: 500 mL via INTRAVENOUS

## 2022-03-18 NOTE — ED Notes (Signed)
Patient transported to CT 

## 2022-03-18 NOTE — ED Provider Notes (Signed)
[name] DEPT Provider Note   CSN: [id number] Arrival date & time: 03/18/22  1759     History {Add pertinent medical, surgical, social history, OB history to HPI:1} Chief Complaint  Patient presents with   Altered Mental Status    TOMESHA SARGENT is a 89 y.o. female.   Altered Mental Status    Pt states she started feeling sick yesterday.  She has had a mild headache.  Pt denies cough, vomiting or diarrhea.  No fevers or chills.  Patient denies chest pain.  EMS reports episode of confusion.  Pt denies feeling confused. I was able to obtain some additional information later on the evening from the family member.  Patient has been having intermittent episodes where she will say she feels poorly and then will have a brief episode of unresponsiveness.  He may have complained of some chest discomfort earlier.  His records reviewed and patient had multiple hospital admissions for this condition.  She has been evaluated by neurology.  She has also had her pacemaker evaluated.  Etiology of her episodes are not clear Home Medications Prior to Admission medications   Medication Sig Start Date End Date Taking? Authorizing Provider  busPIRone (BUSPAR) 5 MG tablet Take 1 tablet (5 mg total) by mouth 3 (three) times daily. 04/29/20   Medina-Vargas, Monina C, NP  donepezil (ARICEPT) 10 MG tablet Take 1 tablet (10 mg total) by mouth at bedtime. 09/05/20   Nita Sells, MD  memantine (NAMENDA XR) 28 MG CP24 24 hr capsule Take 1 capsule (28 mg total) by mouth every morning. 09/05/20   Nita Sells, MD      Allergies    Aspirin    Review of Systems   Review of Systems  Physical Exam Updated Vital Signs BP 122/60   Pulse (!) 104   Temp 98.2 F (36.8 C) (Oral)   Resp 15   SpO2 95%  Physical Exam Vitals and nursing note reviewed.  Constitutional:      General: She is not in acute distress.    Appearance: She is well-developed.  HENT:      Head: Normocephalic and atraumatic.     Right Ear: External ear normal.     Left Ear: External ear normal.  Eyes:     General: No visual field deficit or scleral icterus.       Right eye: No discharge.        Left eye: No discharge.     Conjunctiva/sclera: Conjunctivae normal.  Neck:     Trachea: No tracheal deviation.  Cardiovascular:     Rate and Rhythm: Normal rate and regular rhythm.  Pulmonary:     Effort: Pulmonary effort is normal. No respiratory distress.     Breath sounds: Normal breath sounds. No stridor. No wheezing or rales.  Abdominal:     General: Bowel sounds are normal. There is no distension.     Palpations: Abdomen is soft.     Tenderness: There is no abdominal tenderness. There is no guarding or rebound.  Musculoskeletal:        General: No tenderness.     Cervical back: Neck supple.  Skin:    General: Skin is warm and dry.     Findings: No rash.  Neurological:     Mental Status: She is alert and oriented to person, place, and time.     Cranial Nerves: No cranial nerve deficit, dysarthria or facial asymmetry.     Sensory: No sensory deficit.  Motor: No abnormal muscle tone, seizure activity or pronator drift.     Coordination: Coordination normal.     Comments:  able to hold both legs off bed for 5 seconds, sensation intact in all extremities,  no left or right sided neglect,, no nystagmus noted   Psychiatric:        Mood and Affect: Mood normal.     ED Results / Procedures / Treatments   Labs (all labs ordered are listed, but only abnormal results are displayed) Labs Reviewed  CBC - Abnormal; Notable for the following components:      Result Value   RBC 3.47 (*)    Hemoglobin 10.6 (*)    HCT 33.9 (*)    All other components within normal limits  COMPREHENSIVE METABOLIC PANEL - Abnormal; Notable for the following components:   Potassium 5.2 (*)    Glucose, Bld 129 (*)    BUN 43 (*)    Creatinine, Ser 1.73 (*)    Albumin 3.1 (*)    GFR,  Estimated 28 (*)    All other components within normal limits  I-STAT CHEM 8, ED - Abnormal; Notable for the following components:   BUN 44 (*)    Creatinine, Ser 2.10 (*)    Glucose, Bld 128 (*)    Hemoglobin 10.9 (*)    HCT 32.0 (*)    All other components within normal limits  ETHANOL  PROTIME-INR  APTT  DIFFERENTIAL  RAPID URINE DRUG SCREEN, HOSP PERFORMED  URINALYSIS, ROUTINE W REFLEX MICROSCOPIC  TROPONIN I (HIGH SENSITIVITY)    EKG EKG Interpretation  Date/Time:  Wednesday March 18 2022 18:11:40 EST Ventricular Rate:  60 PR Interval:  218 QRS Duration: 96 QT Interval:  433 QTC Calculation: 433 R Axis:   -29 Text Interpretation: ATRIAL PACED RHYTHM Borderline prolonged PR interval Anterior infarct, old No significant change since last tracing Confirmed by Dorie Rank [phone]) on 03/18/2022 10:52:57 PM  Radiology DG Chest Portable 1 View  Result Date: 03/18/2022 CLINICAL DATA:  Dyspnea EXAM: PORTABLE CHEST 1 VIEW COMPARISON:  03/14/2022 FINDINGS: Cardiac shadow is stable. Pacing device is again seen. Lungs are clear bilaterally. No focal infiltrate is noted. No bony abnormality is seen. IMPRESSION: No active disease. Electronically Signed   By: Inez Catalina M.D.   On: 03/18/2022 23:22   CT HEAD WO CONTRAST  Result Date: 03/18/2022 CLINICAL DATA:  Mental status change, unknown cause EXAM: CT HEAD WITHOUT CONTRAST TECHNIQUE: Contiguous axial images were obtained from the base of the skull through the vertex without intravenous contrast. RADIATION DOSE REDUCTION: This exam was performed according to the departmental dose-optimization program which includes automated exposure control, adjustment of the mA and/or kV according to patient size and/or use of iterative reconstruction technique. COMPARISON:  03/12/2022 FINDINGS: Brain: There is periventricular white matter decreased attenuation consistent with small vessel ischemic changes. Ventricles, sulci and cisterns are prominent  consistent with age related involutional changes. No acute intracranial hemorrhage, mass effect or shift. No hydrocephalus. encephalomalacia noted consistent with an old right cerebellar CVA. Vascular: No hyperdense vessel or unexpected calcification. Skull: Normal. Negative for fracture or focal lesion. Sinuses/Orbits: No acute finding. Mucoperiosteal thickening consistent with a chronic ethmoid and maxillary sinusitis. Postop changes from medial ethmoidectomies. IMPRESSION: Atrophy and chronic small vessel ischemic changes. Chronic right cerebellar CVA. No acute intracranial process identified. Electronically Signed   By: Sammie Bench M.D.   On: 03/18/2022 18:56    Procedures Procedures  {Document cardiac monitor, telemetry assessment procedure  when appropriate:1}  Medications Ordered in ED Medications  sodium chloride 0.9 % bolus 500 mL (has no administration in time range)    Followed by  0.9 %  sodium chloride infusion (has no administration in time range)    ED Course/ Medical Decision Making/ A&P Clinical Course as of 03/18/22 2332  Wed Mar 18, 2022  1957 Head CT shows chronic right cerebellar infarct.  No acute process noted [JK]  2235 I attempted to call patient's granddaughter listed under her contacts.  No response [JK]  2235 Comprehensive metabolic panel(!) BUN and creatinine elevated, similar to previous values.  Potassium slightly increased [JK]  2235 I-stat chem 8, ED(!) Potassium decreased compared to the metabolic panel [JK]  0940 [name] called back.  She reported that patient had an episode very similar to her previous episode.  She just said that she was not feeling good.  She then put her head down and had a brief episode of unresponsiveness. [JK]    Clinical Course User Index [JK] Dorie Rank, MD                           Medical Decision Making Problems Addressed: Chronic kidney disease, unspecified CKD stage: chronic illness or injury Near  syncope: chronic illness or injury with exacerbation, progression, or side effects of treatment  Amount and/or Complexity of Data Reviewed Labs: ordered. Decision-making details documented in ED Course. Radiology: ordered.  Risk Prescription drug management.   Patient presented to ED for recurrent near syncopal episode.  Patient has history of multiple episodes recently.  He had been admitted to the hospital on December 13 as well as December 30.  She was actually just discharged from the hospital on January 2.  She does have chronic kidney disease but her labs are not significantly changed from her baseline.  I have added on a urinalysis which is currently pending.  Head CT and chest x-ray did not show any acute abnormalities.  Will follow-up on troponin and UA.  If unremarkable anticipate discharge home.  Patient had extensive evaluation for this recently.  I do not feel that repeat hospitalization is necessary at this time.  Patient has remained asymptomatic and stable during her several hours here in the ED  {Document critical care time when appropriate:1} {Document review of labs and clinical decision tools ie heart score, Chads2Vasc2 etc:1}  {Document your independent review of radiology images, and any outside records:1} {Document your discussion with family members, caretakers, and with consultants:1} {Document social determinants of health affecting pt's care:1} {Document your decision making why or why not admission, treatments were needed:1} Final Clinical Impression(s) / ED Diagnoses Final diagnoses:  Near syncope  Chronic kidney disease, unspecified CKD stage    Rx / DC Orders ED Discharge Orders     None

## 2022-03-18 NOTE — ED Notes (Signed)
No urine noted in suction canister

## 2022-03-18 NOTE — ED Notes (Signed)
Pure wick applied and pt made aware of need for urine specimen

## 2022-03-18 NOTE — ED Triage Notes (Signed)
[facility] brings pt in from home for a brief period of altered mental status. Denies LOC. Pt is at baseline upon arrival to ER.

## 2022-03-19 DIAGNOSIS — R410 Disorientation, unspecified: Secondary | ICD-10-CM | POA: Diagnosis not present

## 2022-03-19 DIAGNOSIS — Z7401 Bed confinement status: Secondary | ICD-10-CM | POA: Diagnosis not present

## 2022-03-19 DIAGNOSIS — Z743 Need for continuous supervision: Secondary | ICD-10-CM | POA: Diagnosis not present

## 2022-03-19 LAB — URINALYSIS, ROUTINE W REFLEX MICROSCOPIC
Bacteria, UA: NONE SEEN
Bilirubin Urine: NEGATIVE
Glucose, UA: NEGATIVE mg/dL
Ketones, ur: NEGATIVE mg/dL
Leukocytes,Ua: NEGATIVE
Nitrite: NEGATIVE
Protein, ur: NEGATIVE mg/dL
Specific Gravity, Urine: 1.014 (ref 1.005–1.030)
pH: 6 (ref 5.0–8.0)

## 2022-03-19 LAB — RAPID URINE DRUG SCREEN, HOSP PERFORMED
Amphetamines: NOT DETECTED
Barbiturates: NOT DETECTED
Benzodiazepines: NOT DETECTED
Cocaine: NOT DETECTED
Opiates: NOT DETECTED
Tetrahydrocannabinol: NOT DETECTED

## 2022-03-19 NOTE — ED Notes (Signed)
[facility] called for transportation  

## 2022-03-19 NOTE — Discharge Instructions (Addendum)
The test today in the ED were reassuring.  No signs of acute heart issues, dehydration or stroke.  Follow-up with the recommendations given to you after the recent hospital discharge.

## 2022-03-19 NOTE — ED Notes (Signed)
RN attempted to call granddaughter to pick up pt. No answer at this time.

## 2022-03-23 DIAGNOSIS — I110 Hypertensive heart disease with heart failure: Secondary | ICD-10-CM | POA: Diagnosis not present

## 2022-03-23 DIAGNOSIS — N390 Urinary tract infection, site not specified: Secondary | ICD-10-CM | POA: Diagnosis not present

## 2022-03-23 DIAGNOSIS — J4520 Mild intermittent asthma, uncomplicated: Secondary | ICD-10-CM | POA: Diagnosis not present

## 2022-03-23 DIAGNOSIS — I10 Essential (primary) hypertension: Secondary | ICD-10-CM | POA: Diagnosis not present

## 2022-03-23 DIAGNOSIS — Z23 Encounter for immunization: Secondary | ICD-10-CM | POA: Diagnosis not present

## 2022-03-23 DIAGNOSIS — E1140 Type 2 diabetes mellitus with diabetic neuropathy, unspecified: Secondary | ICD-10-CM | POA: Diagnosis not present

## 2022-03-23 DIAGNOSIS — M810 Age-related osteoporosis without current pathological fracture: Secondary | ICD-10-CM | POA: Diagnosis not present

## 2022-03-23 DIAGNOSIS — Z933 Colostomy status: Secondary | ICD-10-CM | POA: Diagnosis not present

## 2022-03-23 DIAGNOSIS — M109 Gout, unspecified: Secondary | ICD-10-CM | POA: Diagnosis not present

## 2022-03-24 NOTE — Progress Notes (Signed)
Remote pacemaker transmission.   

## 2022-03-25 DIAGNOSIS — J4489 Other specified chronic obstructive pulmonary disease: Secondary | ICD-10-CM | POA: Diagnosis not present

## 2022-03-25 DIAGNOSIS — N179 Acute kidney failure, unspecified: Secondary | ICD-10-CM | POA: Diagnosis not present

## 2022-03-25 DIAGNOSIS — E1140 Type 2 diabetes mellitus with diabetic neuropathy, unspecified: Secondary | ICD-10-CM | POA: Diagnosis not present

## 2022-03-25 DIAGNOSIS — E440 Moderate protein-calorie malnutrition: Secondary | ICD-10-CM | POA: Diagnosis not present

## 2022-03-25 DIAGNOSIS — I5032 Chronic diastolic (congestive) heart failure: Secondary | ICD-10-CM | POA: Diagnosis not present

## 2022-03-25 DIAGNOSIS — Z950 Presence of cardiac pacemaker: Secondary | ICD-10-CM | POA: Diagnosis not present

## 2022-03-25 DIAGNOSIS — N184 Chronic kidney disease, stage 4 (severe): Secondary | ICD-10-CM | POA: Diagnosis not present

## 2022-03-25 DIAGNOSIS — I130 Hypertensive heart and chronic kidney disease with heart failure and stage 1 through stage 4 chronic kidney disease, or unspecified chronic kidney disease: Secondary | ICD-10-CM | POA: Diagnosis not present

## 2022-03-25 DIAGNOSIS — E8809 Other disorders of plasma-protein metabolism, not elsewhere classified: Secondary | ICD-10-CM | POA: Diagnosis not present

## 2022-03-25 DIAGNOSIS — E1122 Type 2 diabetes mellitus with diabetic chronic kidney disease: Secondary | ICD-10-CM | POA: Diagnosis not present

## 2022-03-25 DIAGNOSIS — Z9181 History of falling: Secondary | ICD-10-CM | POA: Diagnosis not present

## 2022-03-25 DIAGNOSIS — Z933 Colostomy status: Secondary | ICD-10-CM | POA: Diagnosis not present

## 2022-03-25 DIAGNOSIS — N390 Urinary tract infection, site not specified: Secondary | ICD-10-CM | POA: Diagnosis not present

## 2022-03-31 DIAGNOSIS — Z933 Colostomy status: Secondary | ICD-10-CM | POA: Diagnosis not present

## 2022-03-31 DIAGNOSIS — K631 Perforation of intestine (nontraumatic): Secondary | ICD-10-CM | POA: Diagnosis not present

## 2022-04-01 DIAGNOSIS — I5032 Chronic diastolic (congestive) heart failure: Secondary | ICD-10-CM | POA: Diagnosis not present

## 2022-04-01 DIAGNOSIS — N184 Chronic kidney disease, stage 4 (severe): Secondary | ICD-10-CM | POA: Diagnosis not present

## 2022-04-01 DIAGNOSIS — N179 Acute kidney failure, unspecified: Secondary | ICD-10-CM | POA: Diagnosis not present

## 2022-04-01 DIAGNOSIS — Z9181 History of falling: Secondary | ICD-10-CM | POA: Diagnosis not present

## 2022-04-01 DIAGNOSIS — E1140 Type 2 diabetes mellitus with diabetic neuropathy, unspecified: Secondary | ICD-10-CM | POA: Diagnosis not present

## 2022-04-01 DIAGNOSIS — Z933 Colostomy status: Secondary | ICD-10-CM | POA: Diagnosis not present

## 2022-04-01 DIAGNOSIS — E8809 Other disorders of plasma-protein metabolism, not elsewhere classified: Secondary | ICD-10-CM | POA: Diagnosis not present

## 2022-04-01 DIAGNOSIS — J4489 Other specified chronic obstructive pulmonary disease: Secondary | ICD-10-CM | POA: Diagnosis not present

## 2022-04-01 DIAGNOSIS — I130 Hypertensive heart and chronic kidney disease with heart failure and stage 1 through stage 4 chronic kidney disease, or unspecified chronic kidney disease: Secondary | ICD-10-CM | POA: Diagnosis not present

## 2022-04-01 DIAGNOSIS — E1122 Type 2 diabetes mellitus with diabetic chronic kidney disease: Secondary | ICD-10-CM | POA: Diagnosis not present

## 2022-04-01 DIAGNOSIS — Z950 Presence of cardiac pacemaker: Secondary | ICD-10-CM | POA: Diagnosis not present

## 2022-04-01 DIAGNOSIS — N390 Urinary tract infection, site not specified: Secondary | ICD-10-CM | POA: Diagnosis not present

## 2022-04-01 DIAGNOSIS — E440 Moderate protein-calorie malnutrition: Secondary | ICD-10-CM | POA: Diagnosis not present

## 2022-04-02 DIAGNOSIS — I5032 Chronic diastolic (congestive) heart failure: Secondary | ICD-10-CM | POA: Diagnosis not present

## 2022-04-02 DIAGNOSIS — Z9181 History of falling: Secondary | ICD-10-CM | POA: Diagnosis not present

## 2022-04-02 DIAGNOSIS — E1122 Type 2 diabetes mellitus with diabetic chronic kidney disease: Secondary | ICD-10-CM | POA: Diagnosis not present

## 2022-04-02 DIAGNOSIS — N390 Urinary tract infection, site not specified: Secondary | ICD-10-CM | POA: Diagnosis not present

## 2022-04-02 DIAGNOSIS — N179 Acute kidney failure, unspecified: Secondary | ICD-10-CM | POA: Diagnosis not present

## 2022-04-02 DIAGNOSIS — E8809 Other disorders of plasma-protein metabolism, not elsewhere classified: Secondary | ICD-10-CM | POA: Diagnosis not present

## 2022-04-02 DIAGNOSIS — Z950 Presence of cardiac pacemaker: Secondary | ICD-10-CM | POA: Diagnosis not present

## 2022-04-02 DIAGNOSIS — E1140 Type 2 diabetes mellitus with diabetic neuropathy, unspecified: Secondary | ICD-10-CM | POA: Diagnosis not present

## 2022-04-02 DIAGNOSIS — J4489 Other specified chronic obstructive pulmonary disease: Secondary | ICD-10-CM | POA: Diagnosis not present

## 2022-04-02 DIAGNOSIS — N184 Chronic kidney disease, stage 4 (severe): Secondary | ICD-10-CM | POA: Diagnosis not present

## 2022-04-02 DIAGNOSIS — Z933 Colostomy status: Secondary | ICD-10-CM | POA: Diagnosis not present

## 2022-04-02 DIAGNOSIS — E440 Moderate protein-calorie malnutrition: Secondary | ICD-10-CM | POA: Diagnosis not present

## 2022-04-02 DIAGNOSIS — I130 Hypertensive heart and chronic kidney disease with heart failure and stage 1 through stage 4 chronic kidney disease, or unspecified chronic kidney disease: Secondary | ICD-10-CM | POA: Diagnosis not present

## 2022-04-03 DIAGNOSIS — I5032 Chronic diastolic (congestive) heart failure: Secondary | ICD-10-CM | POA: Diagnosis not present

## 2022-04-03 DIAGNOSIS — N184 Chronic kidney disease, stage 4 (severe): Secondary | ICD-10-CM | POA: Diagnosis not present

## 2022-04-03 DIAGNOSIS — E8809 Other disorders of plasma-protein metabolism, not elsewhere classified: Secondary | ICD-10-CM | POA: Diagnosis not present

## 2022-04-03 DIAGNOSIS — E1122 Type 2 diabetes mellitus with diabetic chronic kidney disease: Secondary | ICD-10-CM | POA: Diagnosis not present

## 2022-04-03 DIAGNOSIS — J4489 Other specified chronic obstructive pulmonary disease: Secondary | ICD-10-CM | POA: Diagnosis not present

## 2022-04-03 DIAGNOSIS — N390 Urinary tract infection, site not specified: Secondary | ICD-10-CM | POA: Diagnosis not present

## 2022-04-03 DIAGNOSIS — N179 Acute kidney failure, unspecified: Secondary | ICD-10-CM | POA: Diagnosis not present

## 2022-04-03 DIAGNOSIS — Z9181 History of falling: Secondary | ICD-10-CM | POA: Diagnosis not present

## 2022-04-03 DIAGNOSIS — E1140 Type 2 diabetes mellitus with diabetic neuropathy, unspecified: Secondary | ICD-10-CM | POA: Diagnosis not present

## 2022-04-03 DIAGNOSIS — E440 Moderate protein-calorie malnutrition: Secondary | ICD-10-CM | POA: Diagnosis not present

## 2022-04-03 DIAGNOSIS — Z950 Presence of cardiac pacemaker: Secondary | ICD-10-CM | POA: Diagnosis not present

## 2022-04-03 DIAGNOSIS — I130 Hypertensive heart and chronic kidney disease with heart failure and stage 1 through stage 4 chronic kidney disease, or unspecified chronic kidney disease: Secondary | ICD-10-CM | POA: Diagnosis not present

## 2022-04-03 DIAGNOSIS — Z933 Colostomy status: Secondary | ICD-10-CM | POA: Diagnosis not present

## 2022-04-07 DIAGNOSIS — Z933 Colostomy status: Secondary | ICD-10-CM | POA: Diagnosis not present

## 2022-04-07 DIAGNOSIS — E8809 Other disorders of plasma-protein metabolism, not elsewhere classified: Secondary | ICD-10-CM | POA: Diagnosis not present

## 2022-04-07 DIAGNOSIS — N390 Urinary tract infection, site not specified: Secondary | ICD-10-CM | POA: Diagnosis not present

## 2022-04-07 DIAGNOSIS — I5032 Chronic diastolic (congestive) heart failure: Secondary | ICD-10-CM | POA: Diagnosis not present

## 2022-04-07 DIAGNOSIS — N179 Acute kidney failure, unspecified: Secondary | ICD-10-CM | POA: Diagnosis not present

## 2022-04-07 DIAGNOSIS — I130 Hypertensive heart and chronic kidney disease with heart failure and stage 1 through stage 4 chronic kidney disease, or unspecified chronic kidney disease: Secondary | ICD-10-CM | POA: Diagnosis not present

## 2022-04-07 DIAGNOSIS — E440 Moderate protein-calorie malnutrition: Secondary | ICD-10-CM | POA: Diagnosis not present

## 2022-04-07 DIAGNOSIS — Z950 Presence of cardiac pacemaker: Secondary | ICD-10-CM | POA: Diagnosis not present

## 2022-04-07 DIAGNOSIS — J4489 Other specified chronic obstructive pulmonary disease: Secondary | ICD-10-CM | POA: Diagnosis not present

## 2022-04-07 DIAGNOSIS — Z9181 History of falling: Secondary | ICD-10-CM | POA: Diagnosis not present

## 2022-04-07 DIAGNOSIS — N184 Chronic kidney disease, stage 4 (severe): Secondary | ICD-10-CM | POA: Diagnosis not present

## 2022-04-07 DIAGNOSIS — E1140 Type 2 diabetes mellitus with diabetic neuropathy, unspecified: Secondary | ICD-10-CM | POA: Diagnosis not present

## 2022-04-07 DIAGNOSIS — E1122 Type 2 diabetes mellitus with diabetic chronic kidney disease: Secondary | ICD-10-CM | POA: Diagnosis not present

## 2022-04-09 DIAGNOSIS — N179 Acute kidney failure, unspecified: Secondary | ICD-10-CM | POA: Diagnosis not present

## 2022-04-09 DIAGNOSIS — I130 Hypertensive heart and chronic kidney disease with heart failure and stage 1 through stage 4 chronic kidney disease, or unspecified chronic kidney disease: Secondary | ICD-10-CM | POA: Diagnosis not present

## 2022-04-09 DIAGNOSIS — J4489 Other specified chronic obstructive pulmonary disease: Secondary | ICD-10-CM | POA: Diagnosis not present

## 2022-04-09 DIAGNOSIS — I5032 Chronic diastolic (congestive) heart failure: Secondary | ICD-10-CM | POA: Diagnosis not present

## 2022-04-09 DIAGNOSIS — E440 Moderate protein-calorie malnutrition: Secondary | ICD-10-CM | POA: Diagnosis not present

## 2022-04-09 DIAGNOSIS — Z933 Colostomy status: Secondary | ICD-10-CM | POA: Diagnosis not present

## 2022-04-09 DIAGNOSIS — N184 Chronic kidney disease, stage 4 (severe): Secondary | ICD-10-CM | POA: Diagnosis not present

## 2022-04-09 DIAGNOSIS — Z950 Presence of cardiac pacemaker: Secondary | ICD-10-CM | POA: Diagnosis not present

## 2022-04-09 DIAGNOSIS — E1122 Type 2 diabetes mellitus with diabetic chronic kidney disease: Secondary | ICD-10-CM | POA: Diagnosis not present

## 2022-04-09 DIAGNOSIS — E1140 Type 2 diabetes mellitus with diabetic neuropathy, unspecified: Secondary | ICD-10-CM | POA: Diagnosis not present

## 2022-04-09 DIAGNOSIS — Z9181 History of falling: Secondary | ICD-10-CM | POA: Diagnosis not present

## 2022-04-09 DIAGNOSIS — E8809 Other disorders of plasma-protein metabolism, not elsewhere classified: Secondary | ICD-10-CM | POA: Diagnosis not present

## 2022-04-09 DIAGNOSIS — N390 Urinary tract infection, site not specified: Secondary | ICD-10-CM | POA: Diagnosis not present

## 2022-04-10 DIAGNOSIS — N390 Urinary tract infection, site not specified: Secondary | ICD-10-CM | POA: Diagnosis not present

## 2022-04-10 DIAGNOSIS — J4489 Other specified chronic obstructive pulmonary disease: Secondary | ICD-10-CM | POA: Diagnosis not present

## 2022-04-10 DIAGNOSIS — I5032 Chronic diastolic (congestive) heart failure: Secondary | ICD-10-CM | POA: Diagnosis not present

## 2022-04-10 DIAGNOSIS — Z933 Colostomy status: Secondary | ICD-10-CM | POA: Diagnosis not present

## 2022-04-10 DIAGNOSIS — Z9181 History of falling: Secondary | ICD-10-CM | POA: Diagnosis not present

## 2022-04-10 DIAGNOSIS — I130 Hypertensive heart and chronic kidney disease with heart failure and stage 1 through stage 4 chronic kidney disease, or unspecified chronic kidney disease: Secondary | ICD-10-CM | POA: Diagnosis not present

## 2022-04-10 DIAGNOSIS — E440 Moderate protein-calorie malnutrition: Secondary | ICD-10-CM | POA: Diagnosis not present

## 2022-04-10 DIAGNOSIS — N179 Acute kidney failure, unspecified: Secondary | ICD-10-CM | POA: Diagnosis not present

## 2022-04-10 DIAGNOSIS — E8809 Other disorders of plasma-protein metabolism, not elsewhere classified: Secondary | ICD-10-CM | POA: Diagnosis not present

## 2022-04-10 DIAGNOSIS — Z950 Presence of cardiac pacemaker: Secondary | ICD-10-CM | POA: Diagnosis not present

## 2022-04-10 DIAGNOSIS — E1122 Type 2 diabetes mellitus with diabetic chronic kidney disease: Secondary | ICD-10-CM | POA: Diagnosis not present

## 2022-04-10 DIAGNOSIS — N184 Chronic kidney disease, stage 4 (severe): Secondary | ICD-10-CM | POA: Diagnosis not present

## 2022-04-10 DIAGNOSIS — E1140 Type 2 diabetes mellitus with diabetic neuropathy, unspecified: Secondary | ICD-10-CM | POA: Diagnosis not present

## 2022-04-14 DIAGNOSIS — E1140 Type 2 diabetes mellitus with diabetic neuropathy, unspecified: Secondary | ICD-10-CM | POA: Diagnosis not present

## 2022-04-14 DIAGNOSIS — N184 Chronic kidney disease, stage 4 (severe): Secondary | ICD-10-CM | POA: Diagnosis not present

## 2022-04-14 DIAGNOSIS — N390 Urinary tract infection, site not specified: Secondary | ICD-10-CM | POA: Diagnosis not present

## 2022-04-14 DIAGNOSIS — Z9181 History of falling: Secondary | ICD-10-CM | POA: Diagnosis not present

## 2022-04-14 DIAGNOSIS — J4489 Other specified chronic obstructive pulmonary disease: Secondary | ICD-10-CM | POA: Diagnosis not present

## 2022-04-14 DIAGNOSIS — N179 Acute kidney failure, unspecified: Secondary | ICD-10-CM | POA: Diagnosis not present

## 2022-04-14 DIAGNOSIS — Z950 Presence of cardiac pacemaker: Secondary | ICD-10-CM | POA: Diagnosis not present

## 2022-04-14 DIAGNOSIS — I130 Hypertensive heart and chronic kidney disease with heart failure and stage 1 through stage 4 chronic kidney disease, or unspecified chronic kidney disease: Secondary | ICD-10-CM | POA: Diagnosis not present

## 2022-04-14 DIAGNOSIS — E440 Moderate protein-calorie malnutrition: Secondary | ICD-10-CM | POA: Diagnosis not present

## 2022-04-14 DIAGNOSIS — Z933 Colostomy status: Secondary | ICD-10-CM | POA: Diagnosis not present

## 2022-04-14 DIAGNOSIS — E1122 Type 2 diabetes mellitus with diabetic chronic kidney disease: Secondary | ICD-10-CM | POA: Diagnosis not present

## 2022-04-14 DIAGNOSIS — I5032 Chronic diastolic (congestive) heart failure: Secondary | ICD-10-CM | POA: Diagnosis not present

## 2022-04-14 DIAGNOSIS — E8809 Other disorders of plasma-protein metabolism, not elsewhere classified: Secondary | ICD-10-CM | POA: Diagnosis not present

## 2022-05-09 ENCOUNTER — Emergency Department (HOSPITAL_COMMUNITY): Payer: 59

## 2022-05-09 ENCOUNTER — Inpatient Hospital Stay (HOSPITAL_COMMUNITY)
Admission: EM | Admit: 2022-05-09 | Discharge: 2022-05-12 | DRG: 312 | Disposition: A | Discharge status: Home Health | Payer: 59 | Attending: Internal Medicine | Admitting: Internal Medicine

## 2022-05-09 ENCOUNTER — Other Ambulatory Visit: Payer: Self-pay

## 2022-05-09 ENCOUNTER — Observation Stay: Payer: Self-pay

## 2022-05-09 ENCOUNTER — Encounter (HOSPITAL_COMMUNITY): Payer: Self-pay

## 2022-05-09 DIAGNOSIS — F0390 Unspecified dementia without behavioral disturbance: Secondary | ICD-10-CM | POA: Diagnosis present

## 2022-05-09 DIAGNOSIS — R64 Cachexia: Secondary | ICD-10-CM | POA: Diagnosis present

## 2022-05-09 DIAGNOSIS — R55 Syncope and collapse: Principal | ICD-10-CM

## 2022-05-09 DIAGNOSIS — R627 Adult failure to thrive: Secondary | ICD-10-CM | POA: Diagnosis present

## 2022-05-09 DIAGNOSIS — Z87891 Personal history of nicotine dependence: Secondary | ICD-10-CM | POA: Diagnosis not present

## 2022-05-09 DIAGNOSIS — Z950 Presence of cardiac pacemaker: Secondary | ICD-10-CM

## 2022-05-09 DIAGNOSIS — F03C0 Unspecified dementia, severe, without behavioral disturbance, psychotic disturbance, mood disturbance, and anxiety: Secondary | ICD-10-CM | POA: Diagnosis not present

## 2022-05-09 DIAGNOSIS — E43 Unspecified severe protein-calorie malnutrition: Secondary | ICD-10-CM | POA: Diagnosis present

## 2022-05-09 DIAGNOSIS — N1832 Chronic kidney disease, stage 3b: Secondary | ICD-10-CM

## 2022-05-09 DIAGNOSIS — E1140 Type 2 diabetes mellitus with diabetic neuropathy, unspecified: Secondary | ICD-10-CM | POA: Diagnosis not present

## 2022-05-09 DIAGNOSIS — D631 Anemia in chronic kidney disease: Secondary | ICD-10-CM | POA: Diagnosis not present

## 2022-05-09 DIAGNOSIS — E119 Type 2 diabetes mellitus without complications: Secondary | ICD-10-CM

## 2022-05-09 DIAGNOSIS — I5032 Chronic diastolic (congestive) heart failure: Secondary | ICD-10-CM | POA: Diagnosis not present

## 2022-05-09 DIAGNOSIS — N189 Chronic kidney disease, unspecified: Secondary | ICD-10-CM | POA: Diagnosis not present

## 2022-05-09 DIAGNOSIS — F0150 Vascular dementia without behavioral disturbance: Secondary | ICD-10-CM

## 2022-05-09 DIAGNOSIS — Z87440 Personal history of urinary (tract) infections: Secondary | ICD-10-CM | POA: Diagnosis not present

## 2022-05-09 DIAGNOSIS — Z79899 Other long term (current) drug therapy: Secondary | ICD-10-CM | POA: Diagnosis not present

## 2022-05-09 DIAGNOSIS — R404 Transient alteration of awareness: Secondary | ICD-10-CM | POA: Diagnosis not present

## 2022-05-09 DIAGNOSIS — Z981 Arthrodesis status: Secondary | ICD-10-CM | POA: Diagnosis not present

## 2022-05-09 DIAGNOSIS — Z862 Personal history of diseases of the blood and blood-forming organs and certain disorders involving the immune mechanism: Secondary | ICD-10-CM | POA: Diagnosis not present

## 2022-05-09 DIAGNOSIS — J45909 Unspecified asthma, uncomplicated: Secondary | ICD-10-CM | POA: Diagnosis not present

## 2022-05-09 DIAGNOSIS — R0600 Dyspnea, unspecified: Secondary | ICD-10-CM | POA: Diagnosis not present

## 2022-05-09 DIAGNOSIS — Z90710 Acquired absence of both cervix and uterus: Secondary | ICD-10-CM

## 2022-05-09 DIAGNOSIS — Z833 Family history of diabetes mellitus: Secondary | ICD-10-CM | POA: Diagnosis not present

## 2022-05-09 DIAGNOSIS — J449 Chronic obstructive pulmonary disease, unspecified: Secondary | ICD-10-CM | POA: Diagnosis not present

## 2022-05-09 DIAGNOSIS — Z886 Allergy status to analgesic agent status: Secondary | ICD-10-CM | POA: Diagnosis not present

## 2022-05-09 DIAGNOSIS — Z841 Family history of disorders of kidney and ureter: Secondary | ICD-10-CM | POA: Diagnosis not present

## 2022-05-09 DIAGNOSIS — E1122 Type 2 diabetes mellitus with diabetic chronic kidney disease: Secondary | ICD-10-CM | POA: Diagnosis not present

## 2022-05-09 DIAGNOSIS — R0902 Hypoxemia: Secondary | ICD-10-CM | POA: Diagnosis not present

## 2022-05-09 DIAGNOSIS — Z8249 Family history of ischemic heart disease and other diseases of the circulatory system: Secondary | ICD-10-CM | POA: Diagnosis not present

## 2022-05-09 DIAGNOSIS — I130 Hypertensive heart and chronic kidney disease with heart failure and stage 1 through stage 4 chronic kidney disease, or unspecified chronic kidney disease: Secondary | ICD-10-CM | POA: Diagnosis not present

## 2022-05-09 DIAGNOSIS — Z743 Need for continuous supervision: Secondary | ICD-10-CM | POA: Diagnosis not present

## 2022-05-09 DIAGNOSIS — Z933 Colostomy status: Secondary | ICD-10-CM

## 2022-05-09 DIAGNOSIS — Z681 Body mass index (BMI) 19 or less, adult: Secondary | ICD-10-CM

## 2022-05-09 DIAGNOSIS — R6889 Other general symptoms and signs: Secondary | ICD-10-CM | POA: Diagnosis not present

## 2022-05-09 LAB — CBC WITH DIFFERENTIAL/PLATELET
Abs Immature Granulocytes: 0.04 10*3/uL (ref 0.00–0.07)
Basophils Absolute: 0 10*3/uL (ref 0.0–0.1)
Basophils Relative: 0 %
Eosinophils Absolute: 0 10*3/uL (ref 0.0–0.5)
Eosinophils Relative: 0 %
HCT: 31.5 % — ABNORMAL LOW (ref 36.0–46.0)
Hemoglobin: 10 g/dL — ABNORMAL LOW (ref 12.0–15.0)
Immature Granulocytes: 1 %
Lymphocytes Relative: 7 %
Lymphs Abs: 0.6 10*3/uL — ABNORMAL LOW (ref 0.7–4.0)
MCH: 30.8 pg (ref 26.0–34.0)
MCHC: 31.7 g/dL (ref 30.0–36.0)
MCV: 96.9 fL (ref 80.0–100.0)
Monocytes Absolute: 0.4 10*3/uL (ref 0.1–1.0)
Monocytes Relative: 5 %
Neutro Abs: 7.6 10*3/uL (ref 1.7–7.7)
Neutrophils Relative %: 87 %
Platelets: 183 10*3/uL (ref 150–400)
RBC: 3.25 MIL/uL — ABNORMAL LOW (ref 3.87–5.11)
RDW: 13 % (ref 11.5–15.5)
WBC: 8.7 10*3/uL (ref 4.0–10.5)
nRBC: 0 % (ref 0.0–0.2)

## 2022-05-09 LAB — AMMONIA: Ammonia: 35 umol/L (ref 9–35)

## 2022-05-09 LAB — COMPREHENSIVE METABOLIC PANEL
ALT: 11 U/L (ref 0–44)
AST: 19 U/L (ref 15–41)
Albumin: 2.9 g/dL — ABNORMAL LOW (ref 3.5–5.0)
Alkaline Phosphatase: 65 U/L (ref 38–126)
Anion gap: 9 (ref 5–15)
BUN: 25 mg/dL — ABNORMAL HIGH (ref 8–23)
CO2: 23 mmol/L (ref 22–32)
Calcium: 9.1 mg/dL (ref 8.9–10.3)
Chloride: 106 mmol/L (ref 98–111)
Creatinine, Ser: 1.58 mg/dL — ABNORMAL HIGH (ref 0.44–1.00)
GFR, Estimated: 32 mL/min — ABNORMAL LOW (ref >60)
Glucose, Bld: 163 mg/dL — ABNORMAL HIGH (ref 70–99)
Potassium: 4.8 mmol/L (ref 3.5–5.1)
Sodium: 138 mmol/L (ref 135–145)
Total Bilirubin: 0.7 mg/dL (ref 0.3–1.2)
Total Protein: 6 g/dL — ABNORMAL LOW (ref 6.5–8.1)

## 2022-05-09 LAB — TROPONIN I (HIGH SENSITIVITY): Troponin I (High Sensitivity): 5 ng/L (ref <18)

## 2022-05-09 LAB — MAGNESIUM: Magnesium: 1.8 mg/dL (ref 1.7–2.4)

## 2022-05-09 LAB — CBG MONITORING, ED: Glucose-Capillary: 212 mg/dL — ABNORMAL HIGH (ref 70–99)

## 2022-05-09 MED ORDER — LACTATED RINGERS IV BOLUS
1000.0000 mL | Freq: Once | INTRAVENOUS | Status: AC
  Administered 2022-05-09: 1000 mL via INTRAVENOUS

## 2022-05-09 MED ORDER — MELATONIN 3 MG PO TABS: 3.0000 mg | ORAL_TABLET | Freq: Every evening | ORAL | Status: DC | PRN

## 2022-05-09 MED ORDER — MEMANTINE HCL ER 28 MG PO CP24
28.0000 mg | ORAL_CAPSULE | Freq: Every morning | ORAL | Status: DC
  Administered 2022-05-10 – 2022-05-12 (×3): 28 mg via ORAL
  Filled 2022-05-09 (×3): qty 1

## 2022-05-09 MED ORDER — INSULIN ASPART 100 UNIT/ML IJ SOLN
0.0000 [IU] | Freq: Three times a day (TID) | INTRAMUSCULAR | Status: DC
  Administered 2022-05-10 – 2022-05-12 (×4): 1 [IU] via SUBCUTANEOUS

## 2022-05-09 MED ORDER — ACETAMINOPHEN 650 MG RE SUPP: 650.0000 mg | Freq: Four times a day (QID) | RECTAL | Status: DC | PRN

## 2022-05-09 MED ORDER — ACETAMINOPHEN 325 MG PO TABS: 650.0000 mg | ORAL_TABLET | Freq: Four times a day (QID) | ORAL | Status: DC | PRN

## 2022-05-09 MED ORDER — DONEPEZIL HCL 10 MG PO TABS
10.0000 mg | ORAL_TABLET | Freq: Every day | ORAL | Status: DC
  Administered 2022-05-09 – 2022-05-11 (×3): 10 mg via ORAL
  Filled 2022-05-09 (×3): qty 1

## 2022-05-09 MED ORDER — LACTATED RINGERS IV SOLN: INTRAVENOUS | Status: AC

## 2022-05-09 NOTE — ED Notes (Signed)
Paged admitting MD. 4th IV infiltrated.

## 2022-05-09 NOTE — ED Notes (Signed)
IV Team at bedside 

## 2022-05-09 NOTE — ED Notes (Signed)
Bladder Scan results 175m

## 2022-05-09 NOTE — ED Triage Notes (Addendum)
Patient bib [facility] from home after the family was concerned she is not at baseline. Patient has severe dementia and is only alert to self at baseline. Patients family reported to [facility] that she is normally ambulatory at baseline and today she is not. On arrival [facility] found patient to be laying in bed lethargic with a respiratory rate of 8. [facility] reports that she was laying in a soaking wet bed when they arrived and it appeared she was not being cared for well at home. On arrival to ED patient is lethargic, and her colostomy bag is completley full. She also has a pacemaker and a hx of UTIS. Medical Power of attorney is her granddaughter.

## 2022-05-09 NOTE — ED Notes (Signed)
Bair hugger placed on patient.  

## 2022-05-09 NOTE — ED Provider Notes (Signed)
[facility] Provider Note   CSN: [id number] Arrival date & time: 05/09/22  1548     History  Chief Complaint  Patient presents with   Failure To Thrive    THEONE DIGIACOMO is a 89 y.o. female.  89 year old female brought in by EMS due to altered mental status.  Patient has history of severe dementia and is oriented to person and place usually.  Today the patient was noted to not be as responsive.  Patient was found by EMS to be lethargic with decreased respiratory rate.  She was found to be lying in a soaking bed of urine.  She was also attached stool from her colostomy bag as well as a foul smell.  History of multiple UTIs.  Brought in for further management       Home Medications Prior to Admission medications   Medication Sig Start Date End Date Taking? Authorizing Provider  busPIRone (BUSPAR) 5 MG tablet Take 1 tablet (5 mg total) by mouth 3 (three) times daily. 04/29/20   Medina-Vargas, Monina C, NP  donepezil (ARICEPT) 10 MG tablet Take 1 tablet (10 mg total) by mouth at bedtime. 09/05/20   Nita Sells, MD  memantine (NAMENDA XR) 28 MG CP24 24 hr capsule Take 1 capsule (28 mg total) by mouth every morning. 09/05/20   Nita Sells, MD      Allergies    Aspirin    Review of Systems   Review of Systems  Unable to perform ROS: Acuity of condition    Physical Exam Updated Vital Signs SpO2 100%  Physical Exam Vitals and nursing note reviewed.  Constitutional:      General: She is not in acute distress.    Appearance: She is cachectic. She is not toxic-appearing.  HENT:     Head: Normocephalic and atraumatic.  Eyes:     General: Lids are normal.     Conjunctiva/sclera: Conjunctivae normal.     Pupils: Pupils are equal, round, and reactive to light.  Neck:     Thyroid: No thyroid mass.     Trachea: No tracheal deviation.  Cardiovascular:     Rate and Rhythm: Normal rate and regular rhythm.     Heart  sounds: Normal heart sounds. No murmur heard.    No gallop.  Pulmonary:     Effort: Pulmonary effort is normal. No respiratory distress.     Breath sounds: Normal breath sounds. No stridor. No decreased breath sounds, wheezing, rhonchi or rales.  Abdominal:     General: There is no distension.     Palpations: Abdomen is soft.     Tenderness: There is no abdominal tenderness. There is no rebound.  Musculoskeletal:        General: No tenderness. Normal range of motion.     Cervical back: Normal range of motion and neck supple.  Skin:    General: Skin is warm and dry.     Findings: No abrasion or rash.  Neurological:     General: No focal deficit present.     Mental Status: She is alert.     GCS: GCS eye subscore is 4. GCS verbal subscore is 5. GCS motor subscore is 6.     Cranial Nerves: Cranial nerves 2-12 are intact. No cranial nerve deficit.     Sensory: No sensory deficit.     Motor: Motor function is intact.  Psychiatric:        Attention and Perception: Attention normal.  Mood and Affect: Affect is flat.     ED Results / Procedures / Treatments   Labs (all labs ordered are listed, but only abnormal results are displayed) Labs Reviewed  CBC WITH DIFFERENTIAL/PLATELET  AMMONIA  COMPREHENSIVE METABOLIC PANEL  URINALYSIS, ROUTINE W REFLEX MICROSCOPIC    EKG EKG Interpretation  Date/Time:  Saturday May 09 2022 16:19:02 EST Ventricular Rate:  60 PR Interval:    QRS Duration: 101 QT Interval:  470 QTC Calculation: 470 R Axis:   -43 Text Interpretation: Atrial fibrillation Left ventricular hypertrophy Artifact in lead(s) II III aVR aVL aVF V1 V2 V6 Confirmed by Lacretia Leigh ([id number]) on 05/09/2022 5:51:47 PM  Radiology No results found.  Procedures Procedures    Medications Ordered in ED Medications  lactated ringers bolus 1,000 mL (has no administration in time range)  lactated ringers infusion (has no administration in time range)    ED Course/  Medical Decision Making/ A&P                             Medical Decision Making Amount and/or Complexity of Data Reviewed Labs: ordered. Radiology: ordered.  Risk Prescription drug management.   Spoke with patient's granddaughter at home who witnessed the event today.  Which she describes as patient had a syncopal event for approximately 10 minutes.  No reported seizure activity.  Event was sudden.  She was then is slow to come around.  Possible seizure activity.  Granddaughter notes that she has been weak for some time now.  Head CT per my interpretation shows no acute findings.  Here chest x-ray per interpretation shows no acute findings.  Patient given IV fluids and does feel slightly better.  She has normal ammonia level.  Will consult hospitalist for admission for evaluation of syncope        Final Clinical Impression(s) / ED Diagnoses Final diagnoses:  None    Rx / DC Orders ED Discharge Orders     None         Lacretia Leigh, MD 05/09/22 2018

## 2022-05-09 NOTE — ED Notes (Signed)
ED TO INPATIENT HANDOFF REPORT  ED Nurse Name and Phone #: [phone], Valleri Hendricksen  S Name/Age/Gender [name] 89 y.o. female Room/Bed: 011C/011C  Code Status   Code Status: Full Code  Home/SNF/Other Home Patient oriented to: self Is this baseline? Yes   Triage Complete: Triage complete  Chief Complaint Syncope [R55]  Triage Note Patient bib [facility] from home after the family was concerned she is not at baseline. Patient has severe dementia and is only alert to self at baseline. Patients family reported to [facility] that she is normally ambulatory at baseline and today she is not. On arrival [facility] found patient to be laying in bed lethargic with a respiratory rate of 8. [facility] reports that she was laying in a soaking wet bed when they arrived and it appeared she was not being cared for well at home. On arrival to ED patient is lethargic, and her colostomy bag is completley full. She also has a pacemaker and a hx of UTIS. Medical Power of attorney is her granddaughter.    Allergies Allergies  Allergen Reactions   Aspirin Other (See Comments)    Triggers asthma    Level of Care/Admitting Diagnosis ED Disposition     ED Disposition  Admit   Condition  --   [place]: [facility] [[id number]]  Level of Care: Telemetry Medical [104]  May place patient in observation at [facility] or [facility] if equivalent level of care is available:: No  Covid Evaluation: Asymptomatic - no recent exposure (last 10 days) testing not required  Diagnosis: Syncope [206001]  Admitting Physician: Rhetta Mura [id number]  Attending Physician: Rhetta Mura [id number]          B Medical/Surgery History Past Medical History:  Diagnosis Date   Asthma    CKD (chronic kidney disease) stage 3, GFR 30-59 ml/min ([place]) 05/01/2018   Colostomy present ([place])    hx/notes 07/17/2010   Dementia ([place])    Diabetic neuropathy ([place])    Hypertension    hx/notes 07/17/2010    Pneumonia    recent/notes 10/08/2016   Symptomatic bradycardia 12/15/2017   Type II diabetes mellitus ([place])    hx/notes 07/17/2010   Past Surgical History:  Procedure Laterality Date   ABDOMINAL HYSTERECTOMY     COLOSTOMY     S/P SBO hx/notes 07/17/2010   LUMBAR FUSION  11/2006   hx/notes 07/17/2010   NASAL SINUS SURGERY  04/28/2002   Bilateral inferior turbinate reductions, bilateral maxillary antrotomies, bilateral total ethmoidectomies, bilateral frontal recess explorations, bilateral sphenoidotomies, Instat[employer] guidance./notes 07/28/2010   PACEMAKER IMPLANT N/A 05/02/2018   Procedure: PACEMAKER IMPLANT;  Surgeon: Evans Lance, MD;  Location: [facility];  Service: Cardiovascular;  Laterality: N/A;     A IV Location/Drains/Wounds Patient Lines/Drains/Airways Status     Active Line/Drains/Airways     Name Placement date Placement time Site Days   Colostomy LLQ --  --  LLQ  --   External Urinary Catheter 05/09/22  1911  --  less than 1            Intake/Output Last 24 hours No intake or output data in the 24 hours ending 05/09/22 2356  Labs/Imaging Results for orders placed or performed during the hospital encounter of 05/09/22 (from the past 48 hour(s))  CBC with Differential/Platelet     Status: Abnormal   Collection Time: 05/09/22  4:04 PM  Result Value Ref Range   WBC 8.7 4.0 - 10.5 K/uL  RBC 3.25 (L) 3.87 - 5.11 MIL/uL   Hemoglobin 10.0 (L) 12.0 - 15.0 g/dL   HCT 31.5 (L) 36.0 - 46.0 %   MCV 96.9 80.0 - 100.0 fL   MCH 30.8 26.0 - 34.0 pg   MCHC 31.7 30.0 - 36.0 g/dL   RDW 13.0 11.5 - 15.5 %   Platelets 183 150 - 400 K/uL   nRBC 0.0 0.0 - 0.2 %   Neutrophils Relative % 87 %   Neutro Abs 7.6 1.7 - 7.7 K/uL   Lymphocytes Relative 7 %   Lymphs Abs 0.6 (L) 0.7 - 4.0 K/uL   Monocytes Relative 5 %   Monocytes Absolute 0.4 0.1 - 1.0 K/uL   Eosinophils Relative 0 %   Eosinophils Absolute 0.0 0.0 - 0.5 K/uL   Basophils Relative 0 %   Basophils Absolute 0.0  0.0 - 0.1 K/uL   Immature Granulocytes 1 %   Abs Immature Granulocytes 0.04 0.00 - 0.07 K/uL    Comment: Performed at [facility] [address] [place], [place] [address]  Ammonia     Status: None   Collection Time: 05/09/22  4:04 PM  Result Value Ref Range   Ammonia 35 9 - 35 umol/L    Comment: HEMOLYSIS AT THIS LEVEL MAY AFFECT RESULT Performed at [facility], [place] [address], [place], [place] [address]   Comprehensive metabolic panel     Status: Abnormal   Collection Time: 05/09/22  4:04 PM  Result Value Ref Range   Sodium 138 135 - 145 mmol/L   Potassium 4.8 3.5 - 5.1 mmol/L   Chloride 106 98 - 111 mmol/L   CO2 23 22 - 32 mmol/L   Glucose, Bld 163 (H) 70 - 99 mg/dL    Comment: Glucose reference range applies only to samples taken after fasting for at least 8 hours.   BUN 25 (H) 8 - 23 mg/dL   Creatinine, Ser 1.58 (H) 0.44 - 1.00 mg/dL   Calcium 9.1 8.9 - 10.3 mg/dL   Total Protein 6.0 (L) 6.5 - 8.1 g/dL   Albumin 2.9 (L) 3.5 - 5.0 g/dL   AST 19 15 - 41 U/L   ALT 11 0 - 44 U/L   Alkaline Phosphatase 65 38 - 126 U/L   Total Bilirubin 0.7 0.3 - 1.2 mg/dL   GFR, Estimated 32 (L) >60 mL/min    Comment: (NOTE) Calculated using the CKD-EPI Creatinine Equation (2021)    Anion gap 9 5 - 15    Comment: Performed at [facility], [place] [address] [place], [place] [address]  Troponin I (High Sensitivity)     Status: None   Collection Time: 05/09/22  4:04 PM  Result Value Ref Range   Troponin I (High Sensitivity) 5 <18 ng/L    Comment: (NOTE) Elevated high sensitivity troponin I (hsTnI) values and significant  changes across serial measurements may suggest ACS but many other  chronic and acute conditions are known to elevate hsTnI results.  Refer to the "Links" section for chest pain algorithms and additional  guidance. Performed at [facility], [place] [address] [place], [place] [address]   Magnesium     Status: None   Collection Time: 05/09/22  4:04  PM  Result Value Ref Range   Magnesium 1.8 1.7 - 2.4 mg/dL    Comment: Performed at [facility] [address] [place],  [address]  CBG monitoring, ED  Status: Abnormal   Collection Time: 05/09/22  9:37 PM  Result Value Ref Range   Glucose-Capillary 212 (H) 70 - 99 mg/dL    Comment: Glucose reference range applies only to samples taken after fasting for at least 8 hours.   [place] EKG SITE RITE  Result Date: 05/09/2022 If [facility] image not attached, placement could not be confirmed due to current cardiac rhythm.  CT Head Wo Contrast  Result Date: 05/09/2022 CLINICAL DATA:  Mental status change, unknown cause EXAM: CT HEAD WITHOUT CONTRAST TECHNIQUE: Contiguous axial images were obtained from the base of the skull through the vertex without intravenous contrast. RADIATION DOSE REDUCTION: This exam was performed according to the departmental dose-optimization program which includes automated exposure control, adjustment of the mA and/or kV according to patient size and/or use of iterative reconstruction technique. COMPARISON:  03/18/2022 FINDINGS: Brain: No evidence of acute hemorrhage, infarct, extra-axial or subdural collection. No hydrocephalus. Stable degree of atrophy and chronic small vessel ischemia. Chronic remote right cerebellar infarct. Chronic enlarged empty sella. Vascular: Atherosclerosis of skullbase vasculature without hyperdense vessel or abnormal calcification. Skull: No fracture or focal lesion. Sinuses/Orbits: Postsurgical change of the paranasal sinuses with mild mucosal thickening. No acute findings. No mastoid effusion. Other: None. IMPRESSION: 1. No acute intracranial abnormality. 2. Stable atrophy, chronic small vessel ischemia, and remote right cerebellar infarct. Electronically Signed   By: Keith Rake M.D.   On: 05/09/2022 18:36   DG Chest Port 1 View  Result Date: 05/09/2022 CLINICAL DATA:  Dyspnea EXAM: PORTABLE CHEST 1 VIEW COMPARISON:  03/18/2022  FINDINGS: Lungs are hyperinflated in keeping with changes of underlying COPD. The lungs are clear. No pneumothorax or pleural effusion. Cardiac size within normal limits. Left subclavian dual lead pacemaker is unchanged. Pulmonary vascularity is normal. Osseous structures are age-appropriate. No acute bone abnormality. IMPRESSION: 1. No active disease. COPD. Electronically Signed   By: Fidela Salisbury M.D.   On: 05/09/2022 18:10    Pending Labs Unresulted Labs (From admission, onward)     Start     Ordered   05/10/22 0500  CBC with Differential/Platelet  Tomorrow morning,   R        05/09/22 2041   05/10/22 0500  Comprehensive metabolic panel  Tomorrow morning,   R        05/09/22 2041   05/10/22 0500  Magnesium  Tomorrow morning,   R        05/09/22 2041   05/09/22 2121  Protime-INR  Add-on,   AD        05/09/22 2120   05/09/22 2117  Rapid urine drug screen (hospital performed)  Add-on,   AD        05/09/22 2117   05/09/22 1604  Urinalysis, Routine w reflex microscopic -Urine, Clean Catch  Once,   URGENT       Question:  Specimen Source  Answer:  Urine, Clean Catch   05/09/22 1603            Vitals/Pain Today's Vitals   05/09/22 2130 05/09/22 2230 05/09/22 2236 05/09/22 2330  BP: (!) 136/102 (!) 142/60  116/70  Pulse: 83 67  (!) 52  Resp: (!) '25 13  16  '$ Temp:      TempSrc:      SpO2: 98% 100%  100%  Weight:      Height:      PainSc:   Asleep     Isolation Precautions No active isolations  Medications Medications  lactated ringers  infusion (0 mLs Intravenous Paused 05/09/22 2120)  acetaminophen (TYLENOL) tablet 650 mg (has no administration in time range)    Or  acetaminophen (TYLENOL) suppository 650 mg (has no administration in time range)  melatonin tablet 3 mg (has no administration in time range)  donepezil (ARICEPT) tablet 10 mg (10 mg Oral Given 05/09/22 2115)  memantine (NAMENDA XR) 24 hr capsule 28 mg (has no administration in time range)  insulin aspart  (novoLOG) injection 0-6 Units (has no administration in time range)  lactated ringers bolus 1,000 mL (0 mLs Intravenous Paused 05/09/22 2059)  lactated ringers bolus 1,000 mL (0 mLs Intravenous Paused 05/09/22 2100)    Mobility walks with device     Focused Assessments Neuro Assessment Handoff:  Swallow screen pass? Yes  Cardiac Rhythm: Normal sinus rhythm       Neuro Assessment: Exceptions to [facility] Neuro Checks:      Has TPA been given? No If patient is a Neuro Trauma and patient is going to OR before floor call report to [facility] nurse: [phone] or [phone]   R Recommendations: See Admitting Provider Note  Report given to:   Additional Notes:

## 2022-05-09 NOTE — ED Notes (Signed)
Sister Stanton Kidney [phone] would like an update asap

## 2022-05-09 NOTE — ED Notes (Signed)
RN and NT cleaned up patient and replaced colostomy bag.

## 2022-05-09 NOTE — ED Notes (Signed)
Patient transported to CT 

## 2022-05-09 NOTE — H&P (Signed)
History and Physical      [name] [id number] DOB: 06/02/1933 DOA: 05/09/2022  PCP: Leeroy Cha, MD  Patient coming from: home   I have personally briefly reviewed patient's old medical records in [facility]  Chief Complaint: Episode of loss of consciousness  HPI: [name] is a 89 y.o. female with medical history significant for symptomatic bradycardia status post pacemaker placement.  Thousand 20, CKD 3B associated baseline creatinine 1.6-2.0, dementia, type 2 diabetes mellitus, small bowel obstruction status post colostomy in May 0000000, chronic diastolic heart failure, anemia of chronic kidney disease associated baseline hemoglobin 9-11, who is admitted to [facility] on 05/09/2022 with syncope after presenting from home to [facility] ED complaining of episode of loss consciousness.   In the setting of the patient's dementia, history is provided by the patient but also by the patient's granddaughter, in addition to my discussions with the EDP and via chart review.  Granddaughter conveys that she and the patient were sitting at home earlier today, when the patient turned to her and said " I do not feel well".  Within a few seconds of speaking this phrase, granddaughter reports that the patient slumped over onto her side, and was not arousable for approximately 10 minutes.  Upon awakening, the patient was at her baseline mental status, without any overt evidence of confusion relative to her baseline mental status in the setting of underlying dementia.  The patient's husband conveys that she had felt some degree of dizziness, lightheadedness immediately preceding this loss consciousness.  Denies any associated or ensuing chest pain, shortness of breath, palpitations, no additional episodes of syncope noted.  No associated any acute focal weakness, acute focal numbness, paresthesias, vertigo, dysarthria, facial droop, acute change in vision.  Patient denies any recent  subjective fever, chills, rigors, or generalized myalgias.  Granddaughter able to confirm that the patient did not hit her head as component of the above fall, and the patient is noted to not be on any blood thinners as an outpatient, including no aspirin.  The above episode was not associate with any tonic-clonic activity nor any tongue biting.  The patient slumped over as a component of the above event, she ruptured her colostomy bag.  However, it was noted that the above episode was not associate with any loss of bowel/bladder control.  No documented history of seizures.  Medical history notable for documentation of symptomatic bradycardia in October 2019 leading to pacemaker placement in February 2020.  She also has a history of chronic diastolic heart failure, most recent echocardiogram performed in December 2023, which is notable for LVEF 65 to 70%, no focal motion or batteries, moderate concentric LVH, grade 1 diastolic dysfunction, normal right ventricular systolic function, and trivial aortic regurgitation.  He also has a history of type 2 diabetes mellitus, which is now managed via lifestyle modifications, in the absence of any insulin or oral hypoglycemic agents at home.    ED Course:  Vital signs in the ED were notable for the following: Afebrile; heart rate in the 0000000; systolic blood pressures in the low 100s to 150s; respiratory rate 16-21, oxygen saturation 96 to 100% on room air.  Labs were notable for the following: CMP notable for the following: Sodium 130, potassium 4.8, carbon 23, creatinine 1.58 compared to most recent prior serum creatinine dated 1.73 on 03/18/2022, glucose 163, calcium, just for mild hyperlipidemia noted to be 9.9, albumin 2.9, otherwise liver enzymes within normal limits.  High sensitive  troponin I x 1 value was noted to be 5.  CBC notable for the following: Leukocyte count 8700, hemoglobin 10 cystinosis/Norco properties as well as nonelevated RDW, and relative  dimensions prior hemoglobin data point of 10.6 on 03/18/2022.  Urinalysis ordered, with result currently pending.  Per my interpretation, EKG in ED demonstrated the following: Interpretation of which is limited by the presence of motion artifact, but within these limitations, appears to demonstrate sinus rhythm with heart rate 60, normal intervals, no evidence of T wave or ST changes, including no evidence of ST elevation.  Imaging and additional notable ED work-up: 1 view chest x-ray, per formal radiology read, shows no evidence of acute cardiopulmonary process, including evidence of infiltrate, edema, effusion, or pneumothorax.  Additionally, noncontrast CT head, performed radiology read, shows no evidence of acute intracranial process, including no evidence of acute hemorrhage or any evidence of acute infarct.  While in the ED, the following were administered: Lactated Ringer's x 2 L bolus followed by initiation continuous LR running at 125 cc/h.  Subsequently, the patient was admitted for overnight observation for further evaluation management of presenting suspected syncopal episode.    Review of Systems: As per HPI otherwise 10 point review of systems negative.   Past Medical History:  Diagnosis Date   Asthma    CKD (chronic kidney disease) stage 3, GFR 30-59 ml/min (HCC) 05/01/2018   Colostomy present ([place])    hx/notes 07/17/2010   Dementia ([place])    Diabetic neuropathy ([place])    Hypertension    hx/notes 07/17/2010   Pneumonia    recent/notes 10/08/2016   Symptomatic bradycardia 12/15/2017   Type II diabetes mellitus ([place])    hx/notes 07/17/2010    Past Surgical History:  Procedure Laterality Date   ABDOMINAL HYSTERECTOMY     COLOSTOMY     S/P SBO hx/notes 07/17/2010   LUMBAR FUSION  11/2006   hx/notes 07/17/2010   NASAL SINUS SURGERY  04/28/2002   Bilateral inferior turbinate reductions, bilateral maxillary antrotomies, bilateral total ethmoidectomies, bilateral frontal recess  explorations, bilateral sphenoidotomies, Instatrac guidance./notes 07/28/2010   PACEMAKER IMPLANT N/A 05/02/2018   Procedure: PACEMAKER IMPLANT;  Surgeon: Evans Lance, MD;  Location: [place] [facility];  Service: Cardiovascular;  Laterality: N/A;    Social History:  reports that she has never smoked. She quit smokeless tobacco use about 5 years ago.  Her smokeless tobacco use included chew. She reports that she does not drink alcohol and does not use drugs.   Allergies  Allergen Reactions   Aspirin Other (See Comments)    Triggers asthma    Family History  Problem Relation Age of Onset   Kidney disease Mother    Hypertension Mother    Other Father        gangrene with amputation   Diabetes Sister    Cancer Brother    Heart disease Brother    Schizophrenia Daughter    Diabetes Daughter    Stroke Daughter    Diabetes Sister    Breast cancer Neg Hx     Family history reviewed and not pertinent    Prior to Admission medications   Medication Sig Start Date End Date Taking? Authorizing Provider  busPIRone (BUSPAR) 5 MG tablet Take 1 tablet (5 mg total) by mouth 3 (three) times daily. 04/29/20   Medina-Vargas, Monina C, NP  donepezil (ARICEPT) 10 MG tablet Take 1 tablet (10 mg total) by mouth at bedtime. 09/05/20   Nita Sells, MD  memantine [facility]  XR) 28 MG CP24 24 hr capsule Take 1 capsule (28 mg total) by mouth every morning. 09/05/20   Nita Sells, MD     Objective    Physical Exam: Vitals:   05/09/22 1730 05/09/22 1830 05/09/22 1901 05/09/22 2004  BP: 106/61 (!) 113/54    Pulse: 65 63    Resp: 13 16    Temp:    98.4 F (36.9 C)  TempSrc:    Oral  SpO2: 100% 96%    Weight:   41 kg   Height:   '5\' 4"'$  (1.626 m)     General: appears to be stated age; alert, confused Skin: warm, dry, no rash Head:  AT/ [place] Mouth:  Oral mucosa membranes appear dry, normal dentition Neck: supple; trachea midline Heart:  RRR; did not appreciate any M/R/G Lungs:  CTAB, did not appreciate any wheezes, rales, or rhonchi Abdomen: + BS; soft, ND, NT; colostomy bag noted Vascular: 2+ pedal pulses b/l; 2+ radial pulses b/l Extremities: no peripheral edema, no muscle wasting Neuro: strength and sensation intact in upper and lower extremities b/l    Labs on Admission: I have personally reviewed following labs and imaging studies  CBC: Recent Labs  Lab 05/09/22 1604  WBC 8.7  NEUTROABS 7.6  HGB 10.0*  HCT 31.5*  MCV 96.9  PLT [id number]   Basic Metabolic Panel: Recent Labs  Lab 05/09/22 1604  NA 138  K 4.8  CL 106  CO2 23  GLUCOSE 163*  BUN 25*  CREATININE 1.58*  CALCIUM 9.1   GFR: Estimated Creatinine Clearance: 16.5 mL/min (A) (by C-G formula based on SCr of 1.58 mg/dL (H)). Liver Function Tests: Recent Labs  Lab 05/09/22 1604  AST 19  ALT 11  ALKPHOS 65  BILITOT 0.7  PROT 6.0*  ALBUMIN 2.9*   No results for input(s): "LIPASE", "AMYLASE" in the last 168 hours. Recent Labs  Lab 05/09/22 1604  AMMONIA 35   Coagulation Profile: No results for input(s): "INR", "PROTIME" in the last 168 hours. Cardiac Enzymes: No results for input(s): "CKTOTAL", "CKMB", "CKMBINDEX", "TROPONINI" in the last 168 hours. BNP (last 3 results) No results for input(s): "PROBNP" in the last 8760 hours. HbA1C: No results for input(s): "HGBA1C" in the last 72 hours. CBG: No results for input(s): "GLUCAP" in the last 168 hours. Lipid Profile: No results for input(s): "CHOL", "HDL", "LDLCALC", "TRIG", "CHOLHDL", "LDLDIRECT" in the last 72 hours. Thyroid Function Tests: No results for input(s): "TSH", "T4TOTAL", "FREET4", "T3FREE", "THYROIDAB" in the last 72 hours. Anemia Panel: No results for input(s): "VITAMINB12", "FOLATE", "FERRITIN", "TIBC", "IRON", "RETICCTPCT" in the last 72 hours. Urine analysis:    Component Value Date/Time   COLORURINE YELLOW 03/18/2022 2350   APPEARANCEUR CLEAR 03/18/2022 2350   LABSPEC 1.014 03/18/2022 2350   PHURINE 6.0  03/18/2022 2350   GLUCOSEU NEGATIVE 03/18/2022 2350   HGBUR SMALL (A) 03/18/2022 2350   BILIRUBINUR NEGATIVE 03/18/2022 2350   KETONESUR NEGATIVE 03/18/2022 2350   PROTEINUR NEGATIVE 03/18/2022 2350   UROBILINOGEN 1.0 01/09/2015 1620   NITRITE NEGATIVE 03/18/2022 2350   LEUKOCYTESUR NEGATIVE 03/18/2022 2350    Radiological Exams on Admission: [place] EKG SITE RITE  Result Date: 05/09/2022 If Site Rite image not attached, placement could not be confirmed due to current cardiac rhythm.  CT Head Wo Contrast  Result Date: 05/09/2022 CLINICAL DATA:  Mental status change, unknown cause EXAM: CT HEAD WITHOUT CONTRAST TECHNIQUE: Contiguous axial images were obtained from the base of the skull through the vertex without intravenous  contrast. RADIATION DOSE REDUCTION: This exam was performed according to the departmental dose-optimization program which includes automated exposure control, adjustment of the mA and/or kV according to patient size and/or use of iterative reconstruction technique. COMPARISON:  03/18/2022 FINDINGS: Brain: No evidence of acute hemorrhage, infarct, extra-axial or subdural collection. No hydrocephalus. Stable degree of atrophy and chronic small vessel ischemia. Chronic remote right cerebellar infarct. Chronic enlarged empty sella. Vascular: Atherosclerosis of skullbase vasculature without hyperdense vessel or abnormal calcification. Skull: No fracture or focal lesion. Sinuses/Orbits: Postsurgical change of the paranasal sinuses with mild mucosal thickening. No acute findings. No mastoid effusion. Other: None. IMPRESSION: 1. No acute intracranial abnormality. 2. Stable atrophy, chronic small vessel ischemia, and remote right cerebellar infarct. Electronically Signed   By: Keith Rake M.D.   On: 05/09/2022 18:36   DG Chest Port 1 View  Result Date: 05/09/2022 CLINICAL DATA:  Dyspnea EXAM: PORTABLE CHEST 1 VIEW COMPARISON:  03/18/2022 FINDINGS: Lungs are hyperinflated in keeping  with changes of underlying COPD. The lungs are clear. No pneumothorax or pleural effusion. Cardiac size within normal limits. Left subclavian dual lead pacemaker is unchanged. Pulmonary vascularity is normal. Osseous structures are age-appropriate. No acute bone abnormality. IMPRESSION: 1. No active disease. COPD. Electronically Signed   By: Fidela Salisbury M.D.   On: 05/09/2022 18:10      Assessment/Plan   Principal Problem:   Syncope Active Problems:   Chronic diastolic CHF (congestive heart failure) (HCC)   Stage 3b chronic kidney disease (CKD) (HCC)   Dementia (HCC)   DM2 (diabetes mellitus, type 2) (HCC)   History of anemia due to chronic kidney disease     #) Syncope: 1 episode of syncope that that appears to have been associated with prodrome, the patient complains of preceding lightheadedness, dizziness, for a witnessed episode of loss of consciousness lasting a reported 10 minutes, without any overt evidence of seizure-like activity or additional symptoms to suggest underlying seizure at this time.  Regarding the reported absence of any recent positional changes.  Overall, etiology for this episode of syncope is not entirely clear, but in the setting of syncope with suspected prodrome, differential includes neurocardiogenic syncope, orthostatic hypotension.  Is also at increased risk for syncope as a consequence of autonomic dysfunction in the context of a documented history of underlying diabetes.  She is noted to have a history of symptomatic bradycardia status post pacemaker placement in February 2020.  There may be value in interrogating the patient's pacemaker to elucidate rate/ rhythm associated with the above syncopal episode.  Given the suspected presence of prodrome, the possibility of contributory ventricular arrhythmia appears less likely, will continue to closely monitor on symmetry overnight and check serum magnesium level.   Will check orthostatic vital signs, but with the  caveat that the patient has already received IVF's in the ED, potentially altering the results of this evaluation.    Not associated with any overt acute focal neurologic deficits. Clinically, acute ischemic stroke versus seizures appear less likely at this time, and CT head shows no evidence of acute intracranial process. Presentation appears less consistent with ACS at this time, with troponin thus far found to be nonelevated, presenting EKG showing no evidence of acute ischemic changes, and the absence of any associated CP.  There was some artifact associate with presenting EKG, and will repeat EKG to further evaluate.    Plan: I have placed a nursing communication order requesting that orthostatic vital signs x 1 set be checked and documented.  Reduce rate of continuous IV lactated Ringer's from 125 cc/h to 75 cc/h, and will continue to treat for additional 9 hours.  Monitor on telemetry. Monitor strict I's and O's.  Add-on serum Mg level. Check CMP, CBC, serum Mg level in the AM. Fall precautions ordered. Trend trop.  Interrogation of pacemaker.  Urinary drug screen.  Repeat EKG, as above.             #) CKD Stage  3B: Documented history of such, with baseline creatinine 1.6-2.0, with presenting creatinine consistent with this baseline.    Plan: Monitor strict I's and O's and daily weights.  Attempt to avoid nephrotoxic agents.  CMP/magnesium level in the AM.                 #) Type 2 Diabetes Mellitus: documented history of such.  Appears manage TLSO modifications in the absence of any insulin or oral hypoglycemic agents at home.   presenting blood sugar: 163. Most recent A1c noted to be 6.4% when checked on 03/15/2022.   Plan: accuchecks QAC and HS with low dose SSI.               #) Chronic diastolic heart failure: documented history of such, with most recent echocardiogram performed in December 2023, which was notable for LVEF 65 to 70% as well as grade  1 diastolic dysfunction, with additional findings as conveyed above. No clinical or radiographic evidence to suggest acutely decompensated heart failure at this time. home diuretic regimen reportedly consists of the following: None.  In order to reduce risk for ensuing development of acute volume overload, will reduce rate of existing continuous IV fluids from 125 cc/h to 75 cc/h, with end time to be 8 AM on 05/10/2022.   Plan: monitor strict I's & O's and daily weights. Repeat CMP in AM. Check serum mag level.  Reduce rate of continuous IV fluids to LR at 75 cc/h, as further detailed above.             #) Anemia of chronic kidney disease: Documented history of such, a/w with baseline hgb range 9-11, with presenting hgb consistent with this range, in the absence of any overt evidence of active bleed.     Plan: Repeat CBC in the morning.  Check INR.             #) Dementia: Documented history of such, per granddaughter, mental status is currently at baseline.  On Aricept and Namenda at home.  Plan: Resume home Aricept and Namenda.      DVT prophylaxis: SCD's   Code Status: Full code Family Communication: Case discussed with the patient's granddaughter, with whom the patient lives, as further detailed above Disposition Plan: Per Rounding Team Consults called: none;  Admission status: Observation     I SPENT GREATER THAN 75  MINUTES IN CLINICAL CARE TIME/MEDICAL DECISION-MAKING IN COMPLETING THIS ADMISSION.      [place] DO [facility]  From [place]   05/09/2022, 9:24 PM

## 2022-05-10 DIAGNOSIS — R55 Syncope and collapse: Secondary | ICD-10-CM | POA: Diagnosis not present

## 2022-05-10 LAB — URINALYSIS, ROUTINE W REFLEX MICROSCOPIC
Bilirubin Urine: NEGATIVE
Glucose, UA: NEGATIVE mg/dL
Hgb urine dipstick: NEGATIVE
Ketones, ur: NEGATIVE mg/dL
Nitrite: NEGATIVE
Protein, ur: NEGATIVE mg/dL
Specific Gravity, Urine: 1.016 (ref 1.005–1.030)
pH: 5 (ref 5.0–8.0)

## 2022-05-10 LAB — CBC WITH DIFFERENTIAL/PLATELET
Abs Immature Granulocytes: 0.02 10*3/uL (ref 0.00–0.07)
Basophils Absolute: 0 10*3/uL (ref 0.0–0.1)
Basophils Relative: 0 %
Eosinophils Absolute: 0.1 10*3/uL (ref 0.0–0.5)
Eosinophils Relative: 1 %
HCT: 28.9 % — ABNORMAL LOW (ref 36.0–46.0)
Hemoglobin: 9.4 g/dL — ABNORMAL LOW (ref 12.0–15.0)
Immature Granulocytes: 0 %
Lymphocytes Relative: 20 %
Lymphs Abs: 1.4 10*3/uL (ref 0.7–4.0)
MCH: 31.4 pg (ref 26.0–34.0)
MCHC: 32.5 g/dL (ref 30.0–36.0)
MCV: 96.7 fL (ref 80.0–100.0)
Monocytes Absolute: 0.5 10*3/uL (ref 0.1–1.0)
Monocytes Relative: 7 %
Neutro Abs: 4.9 10*3/uL (ref 1.7–7.7)
Neutrophils Relative %: 72 %
Platelets: 180 10*3/uL (ref 150–400)
RBC: 2.99 MIL/uL — ABNORMAL LOW (ref 3.87–5.11)
RDW: 13.1 % (ref 11.5–15.5)
WBC: 6.9 10*3/uL (ref 4.0–10.5)
nRBC: 0 % (ref 0.0–0.2)

## 2022-05-10 LAB — GLUCOSE, CAPILLARY
Glucose-Capillary: 151 mg/dL — ABNORMAL HIGH (ref 70–99)
Glucose-Capillary: 130 mg/dL — ABNORMAL HIGH (ref 70–99)
Glucose-Capillary: 158 mg/dL — ABNORMAL HIGH (ref 70–99)
Glucose-Capillary: 150 mg/dL — ABNORMAL HIGH (ref 70–99)

## 2022-05-10 LAB — COMPREHENSIVE METABOLIC PANEL
ALT: 11 U/L (ref 0–44)
AST: 17 U/L (ref 15–41)
Albumin: 2.8 g/dL — ABNORMAL LOW (ref 3.5–5.0)
Alkaline Phosphatase: 64 U/L (ref 38–126)
Anion gap: 11 (ref 5–15)
BUN: 22 mg/dL (ref 8–23)
CO2: 21 mmol/L — ABNORMAL LOW (ref 22–32)
Calcium: 8.6 mg/dL — ABNORMAL LOW (ref 8.9–10.3)
Chloride: 108 mmol/L (ref 98–111)
Creatinine, Ser: 1.42 mg/dL — ABNORMAL HIGH (ref 0.44–1.00)
GFR, Estimated: 36 mL/min — ABNORMAL LOW (ref >60)
Glucose, Bld: 135 mg/dL — ABNORMAL HIGH (ref 70–99)
Potassium: 4.5 mmol/L (ref 3.5–5.1)
Sodium: 140 mmol/L (ref 135–145)
Total Bilirubin: 0.5 mg/dL (ref 0.3–1.2)
Total Protein: 5.6 g/dL — ABNORMAL LOW (ref 6.5–8.1)

## 2022-05-10 LAB — RAPID URINE DRUG SCREEN, HOSP PERFORMED
Amphetamines: NOT DETECTED
Barbiturates: NOT DETECTED
Benzodiazepines: NOT DETECTED
Cocaine: NOT DETECTED
Opiates: NOT DETECTED
Tetrahydrocannabinol: NOT DETECTED

## 2022-05-10 LAB — MAGNESIUM: Magnesium: 1.7 mg/dL (ref 1.7–2.4)

## 2022-05-10 LAB — PROTIME-INR
INR: 1 (ref 0.8–1.2)
Prothrombin Time: 12.7 seconds (ref 11.4–15.2)

## 2022-05-10 LAB — TROPONIN I (HIGH SENSITIVITY)
Troponin I (High Sensitivity): 8 ng/L (ref <18)
Troponin I (High Sensitivity): 8 ng/L (ref <18)

## 2022-05-10 MED ORDER — ORAL CARE MOUTH RINSE: 15.0000 mL | OROMUCOSAL | Status: DC | PRN

## 2022-05-10 MED ORDER — LACTATED RINGERS IV SOLN: INTRAVENOUS | Status: DC

## 2022-05-10 NOTE — Progress Notes (Signed)

## 2022-05-10 NOTE — Progress Notes (Signed)
  Progress Note   Patient: [name] [id number] DOB: October 14, 1933 DOA: 05/09/2022     0 DOS: the patient was seen and examined on 05/10/2022   Brief hospital course: 89 y.o. female with medical history significant for symptomatic bradycardia status post pacemaker placement.  Thousand 20, CKD 3B associated baseline creatinine 1.6-2.0, dementia, type 2 diabetes mellitus, small bowel obstruction status post colostomy in May 0000000, chronic diastolic heart failure, anemia of chronic kidney disease associated baseline hemoglobin 9-11, who is admitted to [facility] on 05/09/2022 with syncope after presenting from home to [facility] ED complaining of episode of loss consciousness. Pt found to be othostatic by vital signs  Assessment and Plan:   Orthostatic Syncope -Orthostatic vitals reviewed. Pt with over 16m drop in SBP from sitting to standing noted -Cont IVF as tolerated for now -Recheck orthostatic vitals in AM -Recheck bmet in AM    Chronic diastolic CHF (congestive heart failure) (HCC) -Seems euvolemic at this time -Presenting BNP 83    Stage 3b chronic kidney disease (CKD) (HCC) -Baseline Cr around 2 range  PTA -Cr currently 1.42    Dementia (HCC) -Seems stable at this time -cont aricept and namenda per home regimen    DM2 (diabetes mellitus, type 2) (HCC) -glycemic trends stable -continue SSI as needed    History of anemia due to chronic kidney disease -Hgb stable      Subjective: Pleasantly confused this AM  Physical Exam: Vitals:   05/10/22 0442 05/10/22 0812 05/10/22 1125 05/10/22 1604  BP: (!) 143/67 122/64 135/62 (!) 104/57  Pulse: 70 78 70 70  Resp: 18 19 18 20  $ Temp: 99 F (37.2 C) 98.1 F (36.7 C) 98.2 F (36.8 C) 98.8 F (37.1 C)  TempSrc: Oral Oral Oral Oral  SpO2: 100% 99% 98% 100%  Weight:      Height:       General exam: Awake, laying in bed, in nad Respiratory system: Normal respiratory effort, no wheezing Cardiovascular system: regular rate,  s1, s2 Gastrointestinal system: Soft, nondistended, positive BS Central nervous system: CN2-12 grossly intact, strength intact Extremities: Perfused, no clubbing Skin: Normal skin turgor, no notable skin lesions seen Psychiatry: Mood normal // no visual hallucinations   Data Reviewed:  Labs reviewed: Na 140, K 4.5, Cr 1.42, WBC 6.9, Hgb 9.4   Family Communication: Pt in room, family not at bedside  Disposition: Status is: Observation The patient will require care spanning > 2 midnights and should be moved to inpatient because: Severity of illness  Planned Discharge Destination: Home    Author: SMarylu Lund MD 05/10/2022 5:18 PM  For on call review [email or web address]

## 2022-05-10 NOTE — Care Management (Signed)
Called niece Regarding MOON letter. No answer.

## 2022-05-10 NOTE — Hospital Course (Signed)
89 y.o. female with medical history significant for symptomatic bradycardia status post pacemaker placement.  Thousand 20, CKD 3B associated baseline creatinine 1.6-2.0, dementia, type 2 diabetes mellitus, small bowel obstruction status post colostomy in May 0000000, chronic diastolic heart failure, anemia of chronic kidney disease associated baseline hemoglobin 9-11, who is admitted to [facility] on 05/09/2022 with syncope after presenting from home to [facility] ED complaining of episode of loss consciousness. Pt found to be othostatic by vital signs

## 2022-05-10 NOTE — Evaluation (Signed)
Physical Therapy Evaluation Patient Details Name: [name] MRN: [id number] DOB: 1933/06/15 Today's Date: 05/10/2022  History of Present Illness  89 yo female admitted 2/24 with syncope and AMS with underlying dementia. PMhx: bradycardia s/p PPM, HTN, dCHF, COPD, T2DM, CKD, colostomy  Clinical Impression  Pt very pleasant and happy to have finished all of her lunch. Pt had not voided this date and with opportunity to get up to [facility] pt able to empty bladder and walk with RW. Pt familiar from prior admissions and appears to be near baseline functional level needing minguard for activity and gait with RW. Pt reports family still present 24hrs and able to assist. Pt with decreased balance, strength and function who will benefit from acute therapy to maximize mobility and safety. Encouraged OOB for meals, BSC and walking daily.        Recommendations for follow up therapy are one component of a multi-disciplinary discharge planning process, led by the attending physician.  Recommendations may be updated based on patient status, additional functional criteria and insurance authorization.  Follow Up Recommendations No PT follow up      Assistance Recommended at Discharge Frequent or constant Supervision/Assistance  Patient can return home with the following  A little help with walking and/or transfers;A little help with bathing/dressing/bathroom;Direct supervision/assist for financial management;Direct supervision/assist for medications management;Help with stairs or ramp for entrance;Assistance with cooking/housework    Equipment Recommendations None recommended by PT  Recommendations for Other Services       Functional Status Assessment Patient has had a recent decline in their functional status and/or demonstrates limited ability to make significant improvements in function in a reasonable and predictable amount of time     Precautions / Restrictions Precautions Precautions:  Fall Precaution Comments: ostomy      Mobility  Bed Mobility Overal bed mobility: Needs Assistance Bed Mobility: Supine to Sit     Supine to sit: Supervision, HOB elevated     General bed mobility comments: HOB 25 degrees with cues to initiate transfer to EOB    Transfers Overall transfer level: Needs assistance   Transfers: Sit to/from Stand, Bed to chair/wheelchair/BSC Sit to Stand: Min guard Stand pivot transfers: Min guard         General transfer comment: minguard with cues for hand placement and RW to pivot from bed to chair    Ambulation/Gait Ambulation/Gait assistance: Min guard Gait Distance (Feet): 100 Feet Assistive device: Rolling walker (2 wheels) Gait Pattern/deviations: Step-through pattern, Decreased stride length, Trunk flexed   Gait velocity interpretation: 1.31 - 2.62 ft/sec, indicative of limited community ambulator   General Gait Details: cues for posture and direction as well as proximity to RW, pt able to self-regulate distance  Stairs            Wheelchair Mobility    Modified Rankin (Stroke Patients Only)       Balance Overall balance assessment: Needs assistance   Sitting balance-Leahy Scale: Good     Standing balance support: Bilateral upper extremity supported Standing balance-Leahy Scale: Fair Standing balance comment: able to stand at sink without support, Rw for gait                             Pertinent Vitals/Pain Pain Assessment Pain Assessment: No/denies pain    Home Living Family/patient expects to be discharged to:: Private residence Living Arrangements: Children Available Help at Discharge: Family;Available 24 hours/day Type of Home: House Home Access:  Stairs to enter   [employer] of Steps: 3   Home Layout: One level Home Equipment: Conservation officer, nature (2 wheels);Cane - single point Additional Comments: Pt confirming information from prior admission, no family present to confirm     Prior Function Prior Level of Function : Needs assist             Mobility Comments: Uses RW for mobility tasks ADLs Comments: Daughter assists with ADL tasks, family does IADLs     Hand Dominance        Extremity/Trunk Assessment   Upper Extremity Assessment Upper Extremity Assessment: Generalized weakness    Lower Extremity Assessment Lower Extremity Assessment: Generalized weakness    Cervical / Trunk Assessment Cervical / Trunk Assessment: Kyphotic  Communication   Communication: No difficulties  Cognition Arousal/Alertness: Awake/alert Behavior During Therapy: WFL for tasks assessed/performed Overall Cognitive Status: History of cognitive impairments - at baseline Area of Impairment: Orientation, Problem solving                 Orientation Level: Time, Situation           Problem Solving: Slow processing, Decreased initiation, Difficulty sequencing, Requires verbal cues, Requires tactile cues          General Comments      Exercises     Assessment/Plan    PT Assessment Patient needs continued PT services  PT Problem List Decreased activity tolerance;Decreased balance;Decreased knowledge of use of DME;Decreased mobility       PT Treatment Interventions Therapeutic activities;DME instruction;Gait training;Therapeutic exercise;Patient/family education;Functional mobility training;Stair training    PT Goals (Current goals can be found in the Care Plan section)  Acute Rehab PT Goals Patient Stated Goal: return home PT Goal Formulation: Patient unable to participate in goal setting Time For Goal Achievement: 05/24/22 Potential to Achieve Goals: Fair    Frequency Min 2X/week     Co-evaluation               AM-PAC PT "6 Clicks" Mobility  Outcome Measure Help needed turning from your back to your side while in a flat bed without using bedrails?: A Little Help needed moving from lying on your back to sitting on the side of a flat  bed without using bedrails?: A Little Help needed moving to and from a bed to a chair (including a wheelchair)?: A Little Help needed standing up from a chair using your arms (e.g., wheelchair or bedside chair)?: A Little Help needed to walk in hospital room?: A Little Help needed climbing 3-5 steps with a railing? : A Lot 6 Click Score: 17    End of Session Equipment Utilized During Treatment: Gait belt Activity Tolerance: Patient tolerated treatment well Patient left: in chair;with call bell/phone within reach;with chair alarm set Nurse Communication: Mobility status PT Visit Diagnosis: Other abnormalities of gait and mobility (R26.89);Muscle weakness (generalized) (M62.81)    Time: [id number] PT Time Calculation (min) (ACUTE ONLY): 21 min   Charges:   PT Evaluation $PT Eval Moderate Complexity: 1 Mod          Anthony, PT Acute Rehabilitation Services Office: [phone]   Sandy Salaam Phillip Maffei 05/10/2022, 1:46 PM

## 2022-05-11 DIAGNOSIS — Z90710 Acquired absence of both cervix and uterus: Secondary | ICD-10-CM | POA: Diagnosis not present

## 2022-05-11 DIAGNOSIS — Z79899 Other long term (current) drug therapy: Secondary | ICD-10-CM | POA: Diagnosis not present

## 2022-05-11 DIAGNOSIS — Z87891 Personal history of nicotine dependence: Secondary | ICD-10-CM | POA: Diagnosis not present

## 2022-05-11 DIAGNOSIS — Z886 Allergy status to analgesic agent status: Secondary | ICD-10-CM | POA: Diagnosis not present

## 2022-05-11 DIAGNOSIS — Z8249 Family history of ischemic heart disease and other diseases of the circulatory system: Secondary | ICD-10-CM | POA: Diagnosis not present

## 2022-05-11 DIAGNOSIS — E1140 Type 2 diabetes mellitus with diabetic neuropathy, unspecified: Secondary | ICD-10-CM | POA: Diagnosis present

## 2022-05-11 DIAGNOSIS — Z841 Family history of disorders of kidney and ureter: Secondary | ICD-10-CM | POA: Diagnosis not present

## 2022-05-11 DIAGNOSIS — Z681 Body mass index (BMI) 19 or less, adult: Secondary | ICD-10-CM | POA: Diagnosis not present

## 2022-05-11 DIAGNOSIS — E43 Unspecified severe protein-calorie malnutrition: Secondary | ICD-10-CM | POA: Diagnosis present

## 2022-05-11 DIAGNOSIS — Z833 Family history of diabetes mellitus: Secondary | ICD-10-CM | POA: Diagnosis not present

## 2022-05-11 DIAGNOSIS — D631 Anemia in chronic kidney disease: Secondary | ICD-10-CM | POA: Diagnosis present

## 2022-05-11 DIAGNOSIS — F03C0 Unspecified dementia, severe, without behavioral disturbance, psychotic disturbance, mood disturbance, and anxiety: Secondary | ICD-10-CM | POA: Diagnosis present

## 2022-05-11 DIAGNOSIS — Z87440 Personal history of urinary (tract) infections: Secondary | ICD-10-CM | POA: Diagnosis not present

## 2022-05-11 DIAGNOSIS — R55 Syncope and collapse: Secondary | ICD-10-CM | POA: Diagnosis not present

## 2022-05-11 DIAGNOSIS — E1122 Type 2 diabetes mellitus with diabetic chronic kidney disease: Secondary | ICD-10-CM | POA: Diagnosis present

## 2022-05-11 DIAGNOSIS — R627 Adult failure to thrive: Secondary | ICD-10-CM | POA: Diagnosis present

## 2022-05-11 DIAGNOSIS — I130 Hypertensive heart and chronic kidney disease with heart failure and stage 1 through stage 4 chronic kidney disease, or unspecified chronic kidney disease: Secondary | ICD-10-CM | POA: Diagnosis present

## 2022-05-11 DIAGNOSIS — R64 Cachexia: Secondary | ICD-10-CM | POA: Diagnosis present

## 2022-05-11 DIAGNOSIS — Z933 Colostomy status: Secondary | ICD-10-CM | POA: Diagnosis not present

## 2022-05-11 DIAGNOSIS — Z981 Arthrodesis status: Secondary | ICD-10-CM | POA: Diagnosis not present

## 2022-05-11 DIAGNOSIS — J45909 Unspecified asthma, uncomplicated: Secondary | ICD-10-CM | POA: Diagnosis present

## 2022-05-11 DIAGNOSIS — N1832 Chronic kidney disease, stage 3b: Secondary | ICD-10-CM | POA: Diagnosis present

## 2022-05-11 DIAGNOSIS — I5032 Chronic diastolic (congestive) heart failure: Secondary | ICD-10-CM | POA: Diagnosis present

## 2022-05-11 DIAGNOSIS — Z950 Presence of cardiac pacemaker: Secondary | ICD-10-CM | POA: Diagnosis not present

## 2022-05-11 LAB — COMPREHENSIVE METABOLIC PANEL
ALT: 10 U/L (ref 0–44)
AST: 13 U/L — ABNORMAL LOW (ref 15–41)
Albumin: 2.4 g/dL — ABNORMAL LOW (ref 3.5–5.0)
Alkaline Phosphatase: 63 U/L (ref 38–126)
Anion gap: 3 — ABNORMAL LOW (ref 5–15)
BUN: 31 mg/dL — ABNORMAL HIGH (ref 8–23)
CO2: 26 mmol/L (ref 22–32)
Calcium: 8.1 mg/dL — ABNORMAL LOW (ref 8.9–10.3)
Chloride: 108 mmol/L (ref 98–111)
Creatinine, Ser: 1.66 mg/dL — ABNORMAL HIGH (ref 0.44–1.00)
GFR, Estimated: 30 mL/min — ABNORMAL LOW (ref >60)
Glucose, Bld: 115 mg/dL — ABNORMAL HIGH (ref 70–99)
Potassium: 4.5 mmol/L (ref 3.5–5.1)
Sodium: 137 mmol/L (ref 135–145)
Total Bilirubin: 0.6 mg/dL (ref 0.3–1.2)
Total Protein: 5.2 g/dL — ABNORMAL LOW (ref 6.5–8.1)

## 2022-05-11 LAB — CBC
HCT: 26.1 % — ABNORMAL LOW (ref 36.0–46.0)
Hemoglobin: 8.5 g/dL — ABNORMAL LOW (ref 12.0–15.0)
MCH: 31.6 pg (ref 26.0–34.0)
MCHC: 32.6 g/dL (ref 30.0–36.0)
MCV: 97 fL (ref 80.0–100.0)
Platelets: 138 10*3/uL — ABNORMAL LOW (ref 150–400)
RBC: 2.69 MIL/uL — ABNORMAL LOW (ref 3.87–5.11)
RDW: 13.2 % (ref 11.5–15.5)
WBC: 6.5 10*3/uL (ref 4.0–10.5)
nRBC: 0 % (ref 0.0–0.2)

## 2022-05-11 LAB — MAGNESIUM: Magnesium: 1.5 mg/dL — ABNORMAL LOW (ref 1.7–2.4)

## 2022-05-11 LAB — URINALYSIS, W/ REFLEX TO CULTURE (INFECTION SUSPECTED)
Bilirubin Urine: NEGATIVE
Glucose, UA: NEGATIVE mg/dL
Ketones, ur: NEGATIVE mg/dL
Leukocytes,Ua: NEGATIVE
Nitrite: NEGATIVE
Protein, ur: NEGATIVE mg/dL
Specific Gravity, Urine: <1.005 — ABNORMAL LOW (ref 1.005–1.030)
pH: 5.5 (ref 5.0–8.0)

## 2022-05-11 LAB — GLUCOSE, CAPILLARY
Glucose-Capillary: 110 mg/dL — ABNORMAL HIGH (ref 70–99)
Glucose-Capillary: 164 mg/dL — ABNORMAL HIGH (ref 70–99)
Glucose-Capillary: 167 mg/dL — ABNORMAL HIGH (ref 70–99)
Glucose-Capillary: 133 mg/dL — ABNORMAL HIGH (ref 70–99)

## 2022-05-11 MED ORDER — MAGNESIUM SULFATE 4 GM/100ML IV SOLN
4.0000 g | Freq: Once | INTRAVENOUS | Status: AC
  Administered 2022-05-11: 4 g via INTRAVENOUS
  Filled 2022-05-11: qty 100

## 2022-05-11 NOTE — Progress Notes (Signed)
Cardiac telemetry order expired. Per MD Marylu Lund, okay to disconnect and leave pt off of telemetry. Telemetry tech notified.

## 2022-05-11 NOTE — Progress Notes (Signed)
Mobility Specialist - Progress Note   05/11/22 1000  Mobility  Activity Ambulated with assistance in hallway  Level of Assistance Contact guard assist, steadying assist  Assistive Device Front wheel walker  Distance Ambulated (ft) 300 ft  Activity Response Tolerated well  Mobility Referral Yes  $Mobility charge 1 Mobility    Pt received in recliner and agreeable. Took seated rest break in hallway x1. Left in recliner w/ chair alarm on and call bell in reach.   [place] Specialist Please contact via SecureChat or Rehab office at [phone]

## 2022-05-11 NOTE — Progress Notes (Signed)
Mobility Specialist - Progress Note   05/11/22 1516  Orthostatic Lying   BP- Lying  (N/A)  Pulse- Lying  (N/A)  Orthostatic Sitting  BP- Sitting 115/57 (73)  Pulse- Sitting 67  Orthostatic Standing at 0 minutes  BP- Standing at 0 minutes 126/60 (80)  Pulse- Standing at 0 minutes 75  Orthostatic Standing at 3 minutes  BP- Standing at 3 minutes 130/61 (81)  Pulse- Standing at 3 minutes 74  Mobility  Activity  (Orthostatic VS, stood from recliner)  Level of Assistance Standby assist, set-up cues, supervision of patient - no hands on  Assistive Device Front wheel walker  Activity Response Tolerated well  Mobility Referral Yes  $Mobility charge 1 Mobility    Pt received sitting in recliner and agreeable. No complaints throughout session. Left in recliner w/ chair alarm on and call bell in her lap.   [place] Specialist Please contact via SecureChat or Rehab office at [phone]

## 2022-05-11 NOTE — Plan of Care (Signed)
  Problem: Coping: Goal: Ability to adjust to condition or change in health will improve Outcome: Progressing   Problem: Fluid Volume: Goal: Ability to maintain a balanced intake and output will improve Outcome: Progressing   Problem: Health Behavior/Discharge Planning: Goal: Ability to identify and utilize available resources and services will improve Outcome: Progressing Goal: Ability to manage health-related needs will improve Outcome: Progressing   Problem: Metabolic: Goal: Ability to maintain appropriate glucose levels will improve Outcome: Progressing   Problem: Nutritional: Goal: Maintenance of adequate nutrition will improve Outcome: Progressing Goal: Progress toward achieving an optimal weight will improve Outcome: Progressing   Problem: Tissue Perfusion: Goal: Adequacy of tissue perfusion will improve Outcome: Progressing   Problem: Education: Goal: Knowledge of General Education information will improve Description: Including pain rating scale, medication(s)/side effects and non-pharmacologic comfort measures Outcome: Progressing   Problem: Clinical Measurements: Goal: Ability to maintain clinical measurements within normal limits will improve Outcome: Progressing Goal: Will remain free from infection Outcome: Progressing Goal: Diagnostic test results will improve Outcome: Progressing Goal: Respiratory complications will improve Outcome: Progressing Goal: Cardiovascular complication will be avoided Outcome: Progressing   Problem: Nutrition: Goal: Adequate nutrition will be maintained Outcome: Progressing   Problem: Coping: Goal: Level of anxiety will decrease Outcome: Progressing   Problem: Elimination: Goal: Will not experience complications related to bowel motility Outcome: Progressing Goal: Will not experience complications related to urinary retention Outcome: Progressing   Problem: Pain Managment: Goal: General experience of comfort will  improve Outcome: Progressing   Problem: Safety: Goal: Ability to remain free from injury will improve Outcome: Progressing   Problem: Skin Integrity: Goal: Risk for impaired skin integrity will decrease Outcome: Progressing

## 2022-05-11 NOTE — Evaluation (Signed)
Occupational Therapy Evaluation Patient Details Name: [name] MRN: [id number] DOB: 1934/03/10 Today's Date: 05/11/2022   History of Present Illness 89 yo female admitted 2/24 with syncope and AMS with underlying dementia. PMhx: bradycardia s/p PPM, HTN, dCHF, COPD, T2DM, CKD, colostomy   Clinical Impression   Pt reports living with daughter and has assist at baseline for ADLs, uses RW for functional mobility, pt currently disoriented to time and situation, needing min A for ADLs, min guard for bed mobility, and min A for transfers with use of RW and 1 person HHA. Pt presenting with impairments listed below, will follow acutely. Recommend HHOT at d/c pending progression.   BP supine 134/71 (87) BP seated EOB 100/79 (84) BP standing 118/73 (85 ) BP after transfer 135/61 (82)     Recommendations for follow up therapy are one component of a multi-disciplinary discharge planning process, led by the attending physician.  Recommendations may be updated based on patient status, additional functional criteria and insurance authorization.   Follow Up Recommendations  Home health OT     Assistance Recommended at Discharge Intermittent Supervision/Assistance  Patient can return home with the following A little help with walking and/or transfers;A lot of help with bathing/dressing/bathroom;Assistance with cooking/housework;Direct supervision/assist for medications management;Direct supervision/assist for financial management;Assist for transportation;Help with stairs or ramp for entrance    Functional Status Assessment  Patient has had a recent decline in their functional status and demonstrates the ability to make significant improvements in function in a reasonable and predictable amount of time.  Equipment Recommendations  BSC/3in1    Recommendations for Other Services PT consult     Precautions / Restrictions Precautions Precautions: Fall Precaution Comments:  ostomy Restrictions Weight Bearing Restrictions: No      Mobility Bed Mobility Overal bed mobility: Needs Assistance Bed Mobility: Supine to Sit     Supine to sit: Min guard          Transfers Overall transfer level: Needs assistance   Transfers: Sit to/from Stand, Bed to chair/wheelchair/BSC Sit to Stand: Min assist           General transfer comment: min A with HHA and use of RW      Balance Overall balance assessment: Needs assistance Sitting-balance support: Feet supported Sitting balance-Leahy Scale: Good     Standing balance support: Bilateral upper extremity supported Standing balance-Leahy Scale: Fair Standing balance comment: able to stand at sink without support, Rw for gait                           ADL either performed or assessed with clinical judgement   ADL Overall ADL's : Needs assistance/impaired Eating/Feeding: Set up   Grooming: Oral care;Standing;Min guard   Upper Body Bathing: Minimal assistance;Sitting   Lower Body Bathing: Minimal assistance   Upper Body Dressing : Minimal assistance   Lower Body Dressing: Minimal assistance   Toilet Transfer: Minimal assistance;Ambulation;Regular Museum/gallery exhibitions officer and Hygiene: Supervision/safety       Functional mobility during ADLs: Minimal assistance;Rolling walker (2 wheels)       Vision   Vision Assessment?: No apparent visual deficits     Perception Perception Perception Tested?: No   Praxis Praxis Praxis tested?: Not tested    Pertinent Vitals/Pain Pain Assessment Pain Assessment: No/denies pain     Hand Dominance Right   Extremity/Trunk Assessment Upper Extremity Assessment Upper Extremity Assessment: Generalized weakness   Lower Extremity Assessment Lower Extremity Assessment:  Generalized weakness   Cervical / Trunk Assessment Cervical / Trunk Assessment: Kyphotic   Communication Communication Communication: No  difficulties   Cognition Arousal/Alertness: Awake/alert Behavior During Therapy: WFL for tasks assessed/performed Overall Cognitive Status: History of cognitive impairments - at baseline Area of Impairment: Orientation, Problem solving                 Orientation Level: Time, Situation           Problem Solving: Slow processing, Decreased initiation, Difficulty sequencing, Requires verbal cues, Requires tactile cues       General Comments  VSS on RA, see note for orthostatics    Exercises     Shoulder Instructions      Home Living Family/patient expects to be discharged to:: Private residence Living Arrangements: Children Available Help at Discharge: Family;Available 24 hours/day Type of Home: House Home Access: Stairs to enter [employer] of Steps: 3   Home Layout: One level     Bathroom Shower/Tub: Teacher, early years/pre: Handicapped height     Home Equipment: Conservation officer, nature (2 wheels);Cane - single point;Shower seat          Prior Functioning/Environment Prior Level of Function : Needs assist             Mobility Comments: Uses RW for mobility tasks ADLs Comments: Daughter assists with ADL tasks, family does IADLs        OT Problem List: Decreased strength;Decreased range of motion;Decreased activity tolerance;Impaired balance (sitting and/or standing);Decreased cognition;Decreased safety awareness      OT Treatment/Interventions: Self-care/ADL training;Therapeutic exercise;Energy conservation;DME and/or AE instruction;Therapeutic activities;Balance training;Patient/family education;Visual/perceptual remediation/compensation;Cognitive remediation/compensation    OT Goals(Current goals can be found in the care plan section) Acute Rehab OT Goals Patient Stated Goal: none stated OT Goal Formulation: With patient Time For Goal Achievement: 05/25/22 Potential to Achieve Goals: Good ADL Goals Pt Will Perform Upper Body  Dressing: with supervision;sitting Pt Will Perform Lower Body Dressing: with supervision;sit to/from stand;sitting/lateral leans Pt Will Transfer to Toilet: with supervision;ambulating;regular height toilet Pt Will Perform Tub/Shower Transfer: with supervision;ambulating;shower seat  OT Frequency: Min 2X/week    Co-evaluation              AM-PAC OT "6 Clicks" Daily Activity     Outcome Measure Help from another person eating meals?: None Help from another person taking care of personal grooming?: A Little Help from another person toileting, which includes using toliet, bedpan, or urinal?: A Little Help from another person bathing (including washing, rinsing, drying)?: A Lot Help from another person to put on and taking off regular upper body clothing?: A Little Help from another person to put on and taking off regular lower body clothing?: A Lot 6 Click Score: 17   End of Session Equipment Utilized During Treatment: Gait belt;Rolling walker (2 wheels) Nurse Communication: Mobility status  Activity Tolerance: Patient tolerated treatment well Patient left: with call bell/phone within reach;in chair;with chair alarm set  OT Visit Diagnosis: Unsteadiness on feet (R26.81);Other abnormalities of gait and mobility (R26.89);Muscle weakness (generalized) (M62.81)                Time: [id number] OT Time Calculation (min): 29 min Charges:  OT General Charges $OT Visit: 1 Visit OT Evaluation $OT Eval Moderate Complexity: 1 Mod OT Treatments $Self Care/Home Management : 8-22 mins  Renaye Rakers, OTD, OTR/L SecureChat Preferred Acute Rehab [phone]   Renaye Rakers [place] 05/11/2022, 10:57 AM

## 2022-05-11 NOTE — TOC Transition Note (Signed)
Transition of Care [facility]) - CM/SW Discharge Note   Patient Details  Name: [name] MRN: [id number] Date of Birth: [birth year]  Transition of Care [facility]) CM/SW Contact:  Zenon Mayo, RN Phone Number: 05/11/2022, 4:30 PM   Clinical Narrative:    NCM spoke with [name], patient's daughter with patient's ok.  [facility] will transport her home tomorrow, Offered choice for [place] and [place], she does not have a preference.  NCM made referral to [facility] with [place].  She is able to take referral. Soc will begin 24 to 48 hrs post dc.   2/27- patient has transport , and the person transporting her is a family friend , he is in [place] transporting someone else and will be about 2 hrs before he comes.    Final next level of care: Home w Home Health Services Barriers to Discharge: Continued Medical Work up   Patient Goals and CMS Choice CMS Medicare.gov Compare Post Acute Care list provided to:: Patient Represenative (must comment) Choice offered to / list presented to : Adult Children  Discharge Placement                         Discharge Plan and Services Additional resources added to the After Visit Summary for   In-house Referral: NA Discharge Planning Services: CM Consult Post Acute Care Choice: NA          DME Arranged: N/A DME Agency: NA       HH Arranged: RN, OT HH Agency: [place] Date HH Agency Contacted: 05/11/22 Time [place]: 1630 Representative spoke with at [place]: [place] Determinants of Health ([place]) Interventions [place]: No Food Insecurity (03/15/2022)  Housing: Low Risk  (03/15/2022)  Transportation Needs: No Transportation Needs (03/15/2022)  Utilities: Not At Risk (03/15/2022)  Tobacco Use: Medium Risk (05/09/2022)     Readmission Risk Interventions    05/11/2022    1:49 PM  Readmission Risk Prevention Plan  Transportation Screening Complete  Medication Review ([place]) Complete  PCP or Specialist appointment within 3-5 days of discharge Complete  HRI or Axis Complete  [place] Not Applicable

## 2022-05-11 NOTE — Progress Notes (Signed)
  Progress Note   Patient: [name] [id number] DOB: 03/28/1933 DOA: 05/09/2022     0 DOS: the patient was seen and examined on 05/11/2022   Brief hospital course: 89 y.o. female with medical history significant for symptomatic bradycardia status post pacemaker placement.  [place] 20, CKD 3B associated baseline creatinine 1.6-2.0, dementia, type 2 diabetes mellitus, small bowel obstruction status post colostomy in May 0000000, chronic diastolic heart failure, anemia of chronic kidney disease associated baseline hemoglobin 9-11, who is admitted to [facility] on 05/09/2022 with syncope after presenting from home to [facility] ED complaining of episode of loss consciousness. Pt found to be othostatic by vital signs  Assessment and Plan:   Orthostatic Syncope -Orthostatic vitals reviewed. Pt with over 27m drop in SBP from sitting to standing noted -Cont IVF as tolerated for now -Repeat orthostatic vitals are improved today -Recheck bmet in AM    Chronic diastolic CHF (congestive heart failure) (HCC) -Seems euvolemic at this time -Presenting BNP 83    Stage 3b chronic kidney disease (CKD) (HCC) -Baseline Cr around 2 range  PTA -Cr currently 1.66, which is around baseline    Dementia (HCC) -Seems stable at this time -cont aricept and namenda per home regimen    DM2 (diabetes mellitus, type 2) (HCC) -glycemic trends stable -continue SSI as needed    History of anemia due to chronic kidney disease -Hgb stable      Subjective: Remains pleasantly confused  Physical Exam: Vitals:   05/11/22 0400 05/11/22 0544 05/11/22 0720 05/11/22 1115  BP: 127/61  137/65 (!) 125/59  Pulse: 63  67 71  Resp: 19  19 20  $ Temp: 98.6 F (37 C)  98.9 F (37.2 C) 98 F (36.7 C)  TempSrc: Oral  Oral Oral  SpO2: 96%  98% 99%  Weight:  44.7 kg    Height:       General exam: Conversant, in no acute distress Respiratory system: normal chest rise, clear, no audible wheezing Cardiovascular system:  regular rhythm, s1-s2 Gastrointestinal system: Nondistended, nontender, pos BS Central nervous system: No seizures, no tremors Extremities: No cyanosis, no joint deformities Skin: No rashes, no pallor Psychiatry: Affect normal // mood seems normal  Data Reviewed:  Labs reviewed: Na 137, K 4.5, Cr 1.66, Hgb 8.5   Family Communication: Pt in room, family over phone  Disposition: Status is: Inpatient Continue inpatient stay because: Severity of illness  Planned Discharge Destination: Home    Author: SMarylu Lund MD 05/11/2022 4:28 PM  For on call review [email or web address]

## 2022-05-11 NOTE — TOC Initial Note (Signed)
Transition of Care [facility]) - Initial/Assessment Note    Patient Details  Name: [name] MRN: [id number] Date of Birth: 26-Dec-1933  Transition [facility]) CM/SW Contact:    Zenon Mayo, RN Phone Number: 05/11/2022, 1:59 PM  Clinical Narrative:                 from home with grand daughter who is the POA, hx of dementia, daily orthostatics has been positive, has picc LR at 75cc/hr, dizzy when standing, cdiff pending.  She has a walker at home, she does not have a scale or bp cuff.  She eats a no sodium diet per patient.  TOC following.  Expected Discharge Plan: [place] Barriers to Discharge: Continued Medical Work up   Patient Goals and CMS Choice Patient states their goals for this hospitalization and ongoing recovery are:: return home   Choice offered to / list presented to : NA      Expected Discharge Plan and Services In-house Referral: NA Discharge Planning Services: CM Consult Post Acute Care Choice: NA Living arrangements for the past 2 months: Single Family Home                 DME Arranged: N/A DME Agency: NA       HH Arranged: NA          Prior Living Arrangements/Services Living arrangements for the past 2 months: Single Family Home Lives with:: Adult Children Patient language and need for interpreter reviewed:: Yes Do you feel safe going back to the place where you live?: Yes      Need for Family Participation in Patient Care: Yes (Comment) Care giver support system in place?: Yes (comment) Current home services:  (rolling walker) Criminal Activity/Legal Involvement Pertinent to Current Situation/Hospitalization: No - Comment as needed  Activities of Daily Living      Permission Sought/Granted                  Emotional Assessment Appearance:: Appears stated age Attitude/Demeanor/Rapport: Engaged Affect (typically observed): Appropriate Orientation: : Oriented to Place, Oriented to Self, Oriented to   Time, Oriented to Situation Alcohol / Substance Use: Not Applicable Psych Involvement: No (comment)  Admission diagnosis:  Syncope [R55] Syncope, unspecified syncope type [R55] Patient Active Problem List   Diagnosis Date Noted   History of anemia due to chronic kidney disease 05/09/2022   Hypoalbuminemia due to protein-calorie malnutrition ([place]) 03/15/2022   Prediabetes 03/15/2022   Syncope 02/27/2022   Hypomagnesemia 02/26/2022   Altered sensorium 08/29/2021   Chronic diastolic CHF (congestive heart failure) ([place]) 08/28/2021   Episode of altered consciousness 08/27/2021   Malnutrition of moderate degree 08/30/2020   Protein-calorie malnutrition, severe 08/30/2020   Acute kidney injury superimposed on chronic kidney disease ([place]) 08/29/2020   Dehydration 08/29/2020   Hyperkalemia 08/29/2020   Macrocytic anemia 08/29/2020   COVID-19 virus infection 04/07/2020   Pacemaker 12/21/2019   UTI (urinary tract infection) 05/11/2019   ARF (acute renal failure) ([place]) 05/10/2019   Acute lower UTI 05/10/2019   Symptomatic bradycardia 05/01/2018   Near syncope 05/01/2018   Stage 3b chronic kidney disease (CKD) ([place]) 05/01/2018   Influenza B 04/10/2018   Generalized weakness 04/10/2018   Essential hypertension 03/01/2018   Asthma with chronic obstructive pulmonary disease (COPD) 03/01/2018   Bradycardia 12/15/2017   AKI (acute kidney injury) ([place]) 07/18/2017   Colostomy present ([place]) 11/08/2016   Syncope and collapse 10/08/2016   Abnormal chest x-ray 10/08/2016  Lethargy 10/08/2016   Dementia ([place])    Sepsis ([place]) 09/05/2016   Community acquired pneumonia 09/05/2016   Acute encephalopathy 09/05/2016   DM2 (diabetes mellitus, type 2) ([place]) 04/09/2012   Nausea & vomiting 04/09/2012   Gallstones 03/15/2011   Partial SBO 03/15/2011   Colostomy hernia ([place]) 03/15/2011   Hypercalcemia 02/28/2007   IRON DEFIC ANEMIA [place] DIET IRON INTAKE 02/28/2007   DISCITIS 02/28/2007   OSTEOMYELITIS  02/28/2007   CHILLS WITHOUT FEVER 02/28/2007   PCP:  Leeroy Cha, MD Pharmacy:   [facility] [place] [address] [place] [address] Phone: [phone] Fax: [phone]     Social Determinants of Health (SDOH) Social History: [place]: No Food Insecurity (03/15/2022)  Housing: Low Risk  (03/15/2022)  Transportation Needs: No Transportation Needs (03/15/2022)  Utilities: Not At Risk (03/15/2022)  Tobacco Use: Medium Risk (05/09/2022)   SDOH Interventions:     Readmission Risk Interventions    05/11/2022    1:49 PM  Readmission Risk Prevention Plan  Transportation Screening Complete  Medication Review ([place]) Complete  PCP or Specialist appointment within 3-5 days of discharge Complete  HRI or [place] Complete  [place] Not Applicable

## 2022-05-12 DIAGNOSIS — R55 Syncope and collapse: Secondary | ICD-10-CM | POA: Diagnosis not present

## 2022-05-12 LAB — CBC
HCT: 29.7 % — ABNORMAL LOW (ref 36.0–46.0)
Hemoglobin: 9.4 g/dL — ABNORMAL LOW (ref 12.0–15.0)
MCH: 30.9 pg (ref 26.0–34.0)
MCHC: 31.6 g/dL (ref 30.0–36.0)
MCV: 97.7 fL (ref 80.0–100.0)
Platelets: 167 10*3/uL (ref 150–400)
RBC: 3.04 MIL/uL — ABNORMAL LOW (ref 3.87–5.11)
RDW: 13.2 % (ref 11.5–15.5)
WBC: 5.7 10*3/uL (ref 4.0–10.5)
nRBC: 0 % (ref 0.0–0.2)

## 2022-05-12 LAB — COMPREHENSIVE METABOLIC PANEL
ALT: 9 U/L (ref 0–44)
AST: 14 U/L — ABNORMAL LOW (ref 15–41)
Albumin: 2.5 g/dL — ABNORMAL LOW (ref 3.5–5.0)
Alkaline Phosphatase: 63 U/L (ref 38–126)
Anion gap: 9 (ref 5–15)
BUN: 32 mg/dL — ABNORMAL HIGH (ref 8–23)
CO2: 24 mmol/L (ref 22–32)
Calcium: 8.5 mg/dL — ABNORMAL LOW (ref 8.9–10.3)
Chloride: 106 mmol/L (ref 98–111)
Creatinine, Ser: 1.3 mg/dL — ABNORMAL HIGH (ref 0.44–1.00)
GFR, Estimated: 40 mL/min — ABNORMAL LOW (ref >60)
Glucose, Bld: 115 mg/dL — ABNORMAL HIGH (ref 70–99)
Potassium: 4.5 mmol/L (ref 3.5–5.1)
Sodium: 139 mmol/L (ref 135–145)
Total Bilirubin: 0.5 mg/dL (ref 0.3–1.2)
Total Protein: 5.5 g/dL — ABNORMAL LOW (ref 6.5–8.1)

## 2022-05-12 LAB — MAGNESIUM: Magnesium: 2.1 mg/dL (ref 1.7–2.4)

## 2022-05-12 LAB — GLUCOSE, CAPILLARY
Glucose-Capillary: 113 mg/dL — ABNORMAL HIGH (ref 70–99)
Glucose-Capillary: 183 mg/dL — ABNORMAL HIGH (ref 70–99)
Glucose-Capillary: 86 mg/dL (ref 70–99)

## 2022-05-12 NOTE — Plan of Care (Signed)
Problem: Education: Goal: Ability to describe self-care measures that may prevent or decrease complications (Diabetes Survival Skills Education) will improve 05/12/2022 1137 by Karlyn Agee, RN Outcome: Completed/Met 05/12/2022 1013 by Karlyn Agee, RN Outcome: Progressing Goal: Individualized Educational Video(s) 05/12/2022 1137 by Karlyn Agee, RN Outcome: Completed/Met 05/12/2022 1013 by Karlyn Agee, RN Outcome: Progressing   Problem: Coping: Goal: Ability to adjust to condition or change in health will improve 05/12/2022 1137 by Karlyn Agee, RN Outcome: Completed/Met 05/12/2022 1013 by Karlyn Agee, RN Outcome: Progressing   Problem: Fluid Volume: Goal: Ability to maintain a balanced intake and output will improve 05/12/2022 1137 by Karlyn Agee, RN Outcome: Completed/Met 05/12/2022 1013 by Karlyn Agee, RN Outcome: Progressing   Problem: Health Behavior/Discharge Planning: Goal: Ability to identify and utilize available resources and services will improve 05/12/2022 1137 by Karlyn Agee, RN Outcome: Completed/Met 05/12/2022 1013 by Karlyn Agee, RN Outcome: Progressing Goal: Ability to manage health-related needs will improve 05/12/2022 1137 by Karlyn Agee, RN Outcome: Completed/Met 05/12/2022 1013 by Karlyn Agee, RN Outcome: Progressing   Problem: Metabolic: Goal: Ability to maintain appropriate glucose levels will improve 05/12/2022 1137 by Karlyn Agee, RN Outcome: Completed/Met 05/12/2022 1013 by Karlyn Agee, RN Outcome: Progressing   Problem: Nutritional: Goal: Maintenance of adequate nutrition will improve 05/12/2022 1137 by Karlyn Agee, RN Outcome: Completed/Met 05/12/2022 1013 by Karlyn Agee, RN Outcome: Progressing Goal: Progress toward achieving an optimal weight will improve 05/12/2022 1137 by Karlyn Agee, RN Outcome: Completed/Met 05/12/2022 1013 by Karlyn Agee, RN Outcome: Progressing   Problem:  Skin Integrity: Goal: Risk for impaired skin integrity will decrease 05/12/2022 1137 by Karlyn Agee, RN Outcome: Completed/Met 05/12/2022 1013 by Karlyn Agee, RN Outcome: Progressing   Problem: Tissue Perfusion: Goal: Adequacy of tissue perfusion will improve 05/12/2022 1137 by Karlyn Agee, RN Outcome: Completed/Met 05/12/2022 1013 by Karlyn Agee, RN Outcome: Progressing   Problem: Education: Goal: Knowledge of General Education information will improve Description: Including pain rating scale, medication(s)/side effects and non-pharmacologic comfort measures 05/12/2022 1137 by Karlyn Agee, RN Outcome: Completed/Met 05/12/2022 1013 by Karlyn Agee, RN Outcome: Progressing   Problem: Health Behavior/Discharge Planning: Goal: Ability to manage health-related needs will improve 05/12/2022 1137 by Karlyn Agee, RN Outcome: Completed/Met 05/12/2022 1013 by Karlyn Agee, RN Outcome: Progressing   Problem: Clinical Measurements: Goal: Ability to maintain clinical measurements within normal limits will improve 05/12/2022 1137 by Karlyn Agee, RN Outcome: Completed/Met 05/12/2022 1013 by Karlyn Agee, RN Outcome: Progressing Goal: Will remain free from infection 05/12/2022 1137 by Karlyn Agee, RN Outcome: Completed/Met 05/12/2022 1013 by Karlyn Agee, RN Outcome: Progressing Goal: Diagnostic test results will improve 05/12/2022 1137 by Karlyn Agee, RN Outcome: Completed/Met 05/12/2022 1013 by Karlyn Agee, RN Outcome: Progressing Goal: Respiratory complications will improve 05/12/2022 1137 by Karlyn Agee, RN Outcome: Completed/Met 05/12/2022 1013 by Karlyn Agee, RN Outcome: Progressing Goal: Cardiovascular complication will be avoided 05/12/2022 1137 by Karlyn Agee, RN Outcome: Completed/Met 05/12/2022 1013 by Karlyn Agee, RN Outcome: Progressing   Problem: Activity: Goal: Risk for activity intolerance will  decrease 05/12/2022 1137 by Karlyn Agee, RN Outcome: Completed/Met 05/12/2022 1013 by Karlyn Agee, RN Outcome: Progressing   Problem: Nutrition: Goal: Adequate nutrition will be maintained 05/12/2022 1137 by Karlyn Agee, RN Outcome: Completed/Met 05/12/2022 1013 by Karlyn Agee, RN Outcome: Progressing  Problem: Coping: Goal: Level of anxiety will decrease 05/12/2022 1137 by Karlyn Agee, RN Outcome: Completed/Met 05/12/2022 1013 by Karlyn Agee, RN Outcome: Progressing   Problem: Elimination: Goal: Will not experience complications related to bowel motility 05/12/2022 1137 by Karlyn Agee, RN Outcome: Completed/Met 05/12/2022 1013 by Karlyn Agee, RN Outcome: Progressing Goal: Will not experience complications related to urinary retention 05/12/2022 1137 by Karlyn Agee, RN Outcome: Completed/Met 05/12/2022 1013 by Karlyn Agee, RN Outcome: Progressing   Problem: Pain Managment: Goal: General experience of comfort will improve 05/12/2022 1137 by Karlyn Agee, RN Outcome: Completed/Met 05/12/2022 1013 by Karlyn Agee, RN Outcome: Progressing   Problem: Safety: Goal: Ability to remain free from injury will improve 05/12/2022 1137 by Karlyn Agee, RN Outcome: Completed/Met 05/12/2022 1013 by Karlyn Agee, RN Outcome: Progressing   Problem: Skin Integrity: Goal: Risk for impaired skin integrity will decrease 05/12/2022 1137 by Karlyn Agee, RN Outcome: Completed/Met 05/12/2022 1013 by Karlyn Agee, RN Outcome: Progressing

## 2022-05-12 NOTE — Discharge Summary (Signed)
Physician Discharge Summary   Patient: [name] MRN: [id number] DOB: April 17, 1933  Admit date:     05/09/2022  Discharge date: 05/12/22  Discharge Physician: Marylu Lund   PCP: Leeroy Cha, MD   Recommendations at discharge:    Follow up with PCP in 1-2 weeks  Discharge Diagnoses: Principal Problem:   Syncope Active Problems:   Chronic diastolic CHF (congestive heart failure) (HCC)   Stage 3b chronic kidney disease (CKD) (HCC)   Dementia (HCC)   DM2 (diabetes mellitus, type 2) (HCC)   History of anemia due to chronic kidney disease  Resolved Problems:   * No resolved hospital problems. *  Hospital Course: 89 y.o. female with medical history significant for symptomatic bradycardia status post pacemaker placement.  [place] 20, CKD 3B associated baseline creatinine 1.6-2.0, dementia, type 2 diabetes mellitus, small bowel obstruction status post colostomy in May 0000000, chronic diastolic heart failure, anemia of chronic kidney disease associated baseline hemoglobin 9-11, who is admitted to [facility] on 05/09/2022 with syncope after presenting from home to [facility] ED complaining of episode of loss consciousness. Pt found to be othostatic by vital signs  Assessment and Plan:   Orthostatic Syncope -Orthostatic vitals reviewed. Pt with over 28m drop in SBP from sitting to standing noted -Repeat orthostatic vitals are improved after receiving IVF -Both patient and family admit to pt not taking in enough fluids PTA -Educated both pt and family about increasing PO fluid intake after discahrge     Chronic diastolic CHF (congestive heart failure) ([place] -Seems euvolemic at this time -Presenting BNP 83     Stage 3b chronic kidney disease (CKD) ([place] -Baseline Cr around 2 range  PTA -renal function remained stable     Dementia ([place] -Seems stable at this time -cont aricept and namenda per home regimen     DM2 (diabetes mellitus, type 2) (HCC) -glycemic trends  stable -continued SSI as needed while in hospital     History of anemia due to chronic kidney disease -Hgb remained stable        Consultants:  Procedures performed:   Disposition: Home Diet recommendation:  Regular diet DISCHARGE MEDICATION: Allergies as of 05/12/2022       Reactions   Aspirin Other (See Comments)   Triggers asthma        Medication List     TAKE these medications    acetaminophen 500 MG tablet Commonly known as: TYLENOL Take 500 mg by mouth every 6 (six) hours as needed for moderate pain.   ALBUTEROL SULFATE PO Take 2 puffs by mouth every 4 (four) hours as needed (Wheezing/SOB).   busPIRone 5 MG tablet Commonly known as: BUSPAR Take 1 tablet (5 mg total) by mouth 3 (three) times daily.   donepezil 10 MG tablet Commonly known as: ARICEPT Take 1 tablet (10 mg total) by mouth at bedtime.   memantine 28 MG Cp24 24 hr capsule Commonly known as: NAMENDA XR Take 1 capsule (28 mg total) by mouth every morning.        Follow-up Information     Health, [facility] up.   Specialty: [place]: Agency will call you to set up apt times Contact information: [phone]         VLeeroy Cha MD Follow up in 1 week(s).   Specialty: Internal Medicine Why: Hospital follow up Contact information: [phone]  Discharge Exam: Filed Weights   05/10/22 0407 05/11/22 0544 05/12/22 0205  Weight: 41.7 kg 44.7 kg 46.8 kg   General exam: Awake, laying in bed, in nad Respiratory system: Normal respiratory effort, no wheezing Cardiovascular system: regular rate, s1, s2 Gastrointestinal system: Soft, nondistended, positive BS Central nervous system: CN2-12 grossly intact, strength intact Extremities: Perfused, no clubbing Skin: Normal skin turgor, no notable skin lesions seen Psychiatry: Mood  normal // no visual hallucinations   Condition at discharge: fair  The results of significant diagnostics from this hospitalization (including imaging, microbiology, ancillary and laboratory) are listed below for reference.   Imaging Studies: [place] EKG SITE RITE  Result Date: 05/09/2022 If [facility] image not attached, placement could not be confirmed due to current cardiac rhythm.  CT Head Wo Contrast  Result Date: 05/09/2022 CLINICAL DATA:  Mental status change, unknown cause EXAM: CT HEAD WITHOUT CONTRAST TECHNIQUE: Contiguous axial images were obtained from the base of the skull through the vertex without intravenous contrast. RADIATION DOSE REDUCTION: This exam was performed according to the departmental dose-optimization program which includes automated exposure control, adjustment of the mA and/or kV according to patient size and/or use of iterative reconstruction technique. COMPARISON:  03/18/2022 FINDINGS: Brain: No evidence of acute hemorrhage, infarct, extra-axial or subdural collection. No hydrocephalus. Stable degree of atrophy and chronic small vessel ischemia. Chronic remote right cerebellar infarct. Chronic enlarged empty sella. Vascular: Atherosclerosis of skullbase vasculature without hyperdense vessel or abnormal calcification. Skull: No fracture or focal lesion. Sinuses/Orbits: Postsurgical change of the paranasal sinuses with mild mucosal thickening. No acute findings. No mastoid effusion. Other: None. IMPRESSION: 1. No acute intracranial abnormality. 2. Stable atrophy, chronic small vessel ischemia, and remote right cerebellar infarct. Electronically Signed   By: Keith Rake M.D.   On: 05/09/2022 18:36   DG Chest Port 1 View  Result Date: 05/09/2022 CLINICAL DATA:  Dyspnea EXAM: PORTABLE CHEST 1 VIEW COMPARISON:  03/18/2022 FINDINGS: Lungs are hyperinflated in keeping with changes of underlying COPD. The lungs are clear. No pneumothorax or pleural effusion. Cardiac size  within normal limits. Left subclavian dual lead pacemaker is unchanged. Pulmonary vascularity is normal. Osseous structures are age-appropriate. No acute bone abnormality. IMPRESSION: 1. No active disease. COPD. Electronically Signed   By: Fidela Salisbury M.D.   On: 05/09/2022 18:10    Microbiology: Results for orders placed or performed during the hospital encounter of 03/14/22  Urine Culture     Status: Abnormal   Collection Time: 03/15/22 11:00 AM   Specimen: Urine, Clean Catch  Result Value Ref Range Status   Specimen Description URINE, CLEAN CATCH  Final   Special Requests   Final    NONE Performed at [facility], [place] [address] [place], [place] [address]    Culture MULTIPLE SPECIES PRESENT, SUGGEST RECOLLECTION (A)  Final   Report Status 03/16/2022 FINAL  Final    Labs: CBC: Recent Labs  Lab 05/09/22 1604 05/10/22 0633 05/11/22 0530 05/12/22 0523  WBC 8.7 6.9 6.5 5.7  NEUTROABS 7.6 4.9  --   --   HGB 10.0* 9.4* 8.5* 9.4*  HCT 31.5* 28.9* 26.1* 29.7*  MCV 96.9 96.7 97.0 97.7  PLT 183 180 138* [id number]   Basic Metabolic Panel: Recent Labs  Lab 05/09/22 1604 05/10/22 0633 05/11/22 0530 05/12/22 0523  NA 138 140 137 139  K 4.8 4.5 4.5 4.5  CL 106 108 108 106  CO2 23 21* 26 24  GLUCOSE 163* 135* 115* 115*  BUN  25* 22 31* 32*  CREATININE 1.58* 1.42* 1.66* 1.30*  CALCIUM 9.1 8.6* 8.1* 8.5*  MG 1.8 1.7 1.5* 2.1   Liver Function Tests: Recent Labs  Lab 05/09/22 1604 05/10/22 0633 05/11/22 0530 05/12/22 0523  AST 19 17 13* 14*  ALT '11 11 10 9  '$ ALKPHOS 65 64 63 63  BILITOT 0.7 0.5 0.6 0.5  PROT 6.0* 5.6* 5.2* 5.5*  ALBUMIN 2.9* 2.8* 2.4* 2.5*   CBG: Recent Labs  Lab 05/11/22 1624 05/11/22 2054 05/12/22 0602 05/12/22 0909 05/12/22 1103  GLUCAP 167* 133* 113* 183* 86    Discharge time spent: less than 30 minutes.  Signed: Marylu Lund, MD [facility] 05/12/2022

## 2022-05-12 NOTE — Plan of Care (Signed)

## 2022-05-25 DIAGNOSIS — Z950 Presence of cardiac pacemaker: Secondary | ICD-10-CM | POA: Diagnosis not present

## 2022-05-25 DIAGNOSIS — F32A Depression, unspecified: Secondary | ICD-10-CM | POA: Diagnosis not present

## 2022-05-25 DIAGNOSIS — F0393 Unspecified dementia, unspecified severity, with mood disturbance: Secondary | ICD-10-CM | POA: Diagnosis not present

## 2022-05-25 DIAGNOSIS — G9340 Encephalopathy, unspecified: Secondary | ICD-10-CM | POA: Diagnosis not present

## 2022-05-25 DIAGNOSIS — E43 Unspecified severe protein-calorie malnutrition: Secondary | ICD-10-CM | POA: Diagnosis not present

## 2022-05-25 DIAGNOSIS — E1122 Type 2 diabetes mellitus with diabetic chronic kidney disease: Secondary | ICD-10-CM | POA: Diagnosis not present

## 2022-05-25 DIAGNOSIS — D509 Iron deficiency anemia, unspecified: Secondary | ICD-10-CM | POA: Diagnosis not present

## 2022-05-25 DIAGNOSIS — E1140 Type 2 diabetes mellitus with diabetic neuropathy, unspecified: Secondary | ICD-10-CM | POA: Diagnosis not present

## 2022-05-25 DIAGNOSIS — K631 Perforation of intestine (nontraumatic): Secondary | ICD-10-CM | POA: Diagnosis not present

## 2022-05-25 DIAGNOSIS — J4489 Other specified chronic obstructive pulmonary disease: Secondary | ICD-10-CM | POA: Diagnosis not present

## 2022-05-25 DIAGNOSIS — Z8616 Personal history of COVID-19: Secondary | ICD-10-CM | POA: Diagnosis not present

## 2022-05-25 DIAGNOSIS — I5032 Chronic diastolic (congestive) heart failure: Secondary | ICD-10-CM | POA: Diagnosis not present

## 2022-05-25 DIAGNOSIS — K56600 Partial intestinal obstruction, unspecified as to cause: Secondary | ICD-10-CM | POA: Diagnosis not present

## 2022-05-25 DIAGNOSIS — Z9181 History of falling: Secondary | ICD-10-CM | POA: Diagnosis not present

## 2022-05-25 DIAGNOSIS — Z87440 Personal history of urinary (tract) infections: Secondary | ICD-10-CM | POA: Diagnosis not present

## 2022-05-25 DIAGNOSIS — D631 Anemia in chronic kidney disease: Secondary | ICD-10-CM | POA: Diagnosis not present

## 2022-05-25 DIAGNOSIS — S91114D Laceration without foreign body of right lesser toe(s) without damage to nail, subsequent encounter: Secondary | ICD-10-CM | POA: Diagnosis not present

## 2022-05-25 DIAGNOSIS — Z933 Colostomy status: Secondary | ICD-10-CM | POA: Diagnosis not present

## 2022-05-25 DIAGNOSIS — N1832 Chronic kidney disease, stage 3b: Secondary | ICD-10-CM | POA: Diagnosis not present

## 2022-05-25 DIAGNOSIS — I130 Hypertensive heart and chronic kidney disease with heart failure and stage 1 through stage 4 chronic kidney disease, or unspecified chronic kidney disease: Secondary | ICD-10-CM | POA: Diagnosis not present

## 2022-05-28 ENCOUNTER — Encounter (HOSPITAL_COMMUNITY): Payer: Self-pay

## 2022-05-28 ENCOUNTER — Emergency Department (HOSPITAL_COMMUNITY)
Admission: EM | Admit: 2022-05-28 | Discharge: 2022-05-29 | Disposition: A | Discharge status: Home / Self Care | Payer: 59 | Attending: Emergency Medicine | Admitting: Emergency Medicine

## 2022-05-28 DIAGNOSIS — N189 Chronic kidney disease, unspecified: Secondary | ICD-10-CM | POA: Insufficient documentation

## 2022-05-28 DIAGNOSIS — Z111 Encounter for screening for respiratory tuberculosis: Secondary | ICD-10-CM | POA: Diagnosis not present

## 2022-05-28 DIAGNOSIS — F0390 Unspecified dementia without behavioral disturbance: Secondary | ICD-10-CM | POA: Insufficient documentation

## 2022-05-28 DIAGNOSIS — Z743 Need for continuous supervision: Secondary | ICD-10-CM | POA: Diagnosis not present

## 2022-05-28 DIAGNOSIS — J449 Chronic obstructive pulmonary disease, unspecified: Secondary | ICD-10-CM | POA: Diagnosis not present

## 2022-05-28 DIAGNOSIS — R638 Other symptoms and signs concerning food and fluid intake: Secondary | ICD-10-CM | POA: Diagnosis not present

## 2022-05-28 DIAGNOSIS — E860 Dehydration: Secondary | ICD-10-CM

## 2022-05-28 DIAGNOSIS — Z7951 Long term (current) use of inhaled steroids: Secondary | ICD-10-CM | POA: Diagnosis not present

## 2022-05-28 DIAGNOSIS — Z79899 Other long term (current) drug therapy: Secondary | ICD-10-CM | POA: Insufficient documentation

## 2022-05-28 DIAGNOSIS — R82998 Other abnormal findings in urine: Secondary | ICD-10-CM | POA: Insufficient documentation

## 2022-05-28 DIAGNOSIS — R1110 Vomiting, unspecified: Secondary | ICD-10-CM | POA: Diagnosis not present

## 2022-05-28 DIAGNOSIS — Z1152 Encounter for screening for COVID-19: Secondary | ICD-10-CM | POA: Diagnosis not present

## 2022-05-28 DIAGNOSIS — E1122 Type 2 diabetes mellitus with diabetic chronic kidney disease: Secondary | ICD-10-CM | POA: Diagnosis not present

## 2022-05-28 DIAGNOSIS — I5032 Chronic diastolic (congestive) heart failure: Secondary | ICD-10-CM | POA: Insufficient documentation

## 2022-05-28 DIAGNOSIS — R1084 Generalized abdominal pain: Secondary | ICD-10-CM | POA: Diagnosis present

## 2022-05-28 DIAGNOSIS — I130 Hypertensive heart and chronic kidney disease with heart failure and stage 1 through stage 4 chronic kidney disease, or unspecified chronic kidney disease: Secondary | ICD-10-CM | POA: Insufficient documentation

## 2022-05-28 DIAGNOSIS — R109 Unspecified abdominal pain: Secondary | ICD-10-CM | POA: Diagnosis not present

## 2022-05-28 DIAGNOSIS — R8290 Unspecified abnormal findings in urine: Secondary | ICD-10-CM

## 2022-05-28 DIAGNOSIS — I129 Hypertensive chronic kidney disease with stage 1 through stage 4 chronic kidney disease, or unspecified chronic kidney disease: Secondary | ICD-10-CM | POA: Insufficient documentation

## 2022-05-28 LAB — COMPREHENSIVE METABOLIC PANEL WITH GFR
ALT: 10 U/L (ref 0–44)
AST: 17 U/L (ref 15–41)
Albumin: 3.1 g/dL — ABNORMAL LOW (ref 3.5–5.0)
Alkaline Phosphatase: 76 U/L (ref 38–126)
Anion gap: 9 (ref 5–15)
BUN: 42 mg/dL — ABNORMAL HIGH (ref 8–23)
CO2: 21 mmol/L — ABNORMAL LOW (ref 22–32)
Calcium: 9.3 mg/dL (ref 8.9–10.3)
Chloride: 111 mmol/L (ref 98–111)
Creatinine, Ser: 1.69 mg/dL — ABNORMAL HIGH (ref 0.44–1.00)
GFR, Estimated: 29 mL/min — ABNORMAL LOW (ref >60)
Glucose, Bld: 128 mg/dL — ABNORMAL HIGH (ref 70–99)
Potassium: 5.2 mmol/L — ABNORMAL HIGH (ref 3.5–5.1)
Sodium: 141 mmol/L (ref 135–145)
Total Bilirubin: 0.7 mg/dL (ref 0.3–1.2)
Total Protein: 6.6 g/dL (ref 6.5–8.1)

## 2022-05-28 LAB — URINALYSIS, ROUTINE W REFLEX MICROSCOPIC
Bilirubin Urine: NEGATIVE
Glucose, UA: NEGATIVE mg/dL
Hgb urine dipstick: NEGATIVE
Ketones, ur: 5 mg/dL — AB
Leukocytes,Ua: NEGATIVE
Nitrite: NEGATIVE
Protein, ur: NEGATIVE mg/dL
Specific Gravity, Urine: 1.014 (ref 1.005–1.030)
pH: 5 (ref 5.0–8.0)

## 2022-05-28 LAB — CBC
HCT: 34.5 % — ABNORMAL LOW (ref 36.0–46.0)
Hemoglobin: 10.8 g/dL — ABNORMAL LOW (ref 12.0–15.0)
MCH: 31.6 pg (ref 26.0–34.0)
MCHC: 31.3 g/dL (ref 30.0–36.0)
MCV: 100.9 fL — ABNORMAL HIGH (ref 80.0–100.0)
Platelets: 242 K/uL (ref 150–400)
RBC: 3.42 MIL/uL — ABNORMAL LOW (ref 3.87–5.11)
RDW: 12.9 % (ref 11.5–15.5)
WBC: 5.8 K/uL (ref 4.0–10.5)
nRBC: 0 % (ref 0.0–0.2)

## 2022-05-28 LAB — RESP PANEL BY RT-PCR (RSV, FLU A&B, COVID)  RVPGX2
Influenza A by PCR: NEGATIVE
Influenza B by PCR: NEGATIVE
Resp Syncytial Virus by PCR: NEGATIVE
SARS Coronavirus 2 by RT PCR: NEGATIVE

## 2022-05-28 LAB — LIPASE, BLOOD: Lipase: 43 U/L (ref 11–51)

## 2022-05-28 MED ORDER — LACTATED RINGERS IV BOLUS
1000.0000 mL | Freq: Once | INTRAVENOUS | Status: AC
  Administered 2022-05-28: 1000 mL via INTRAVENOUS

## 2022-05-28 NOTE — ED Provider Notes (Signed)
[facility] Provider Note   CSN: [id number] Arrival date & time: 05/28/22  1549     History  Chief Complaint  Patient presents with   Abdominal Pain    JALEESE Anderson is a 89 y.o. female with PMH of T2DM, HTN, dementia (at baseline), CKD, COPD, HFpEF (last EF 44 to 70% December 2023) presenting with generalized not feeling well and feeling weak.  Patient has had malodorous urine in the past week, denies dysuria.  Patient had episode of emesis earlier today.  Decreased p.o. intake today. Denies fever, abdominal pain, chest pain, shortness of breath, cough, hematuria.     Abdominal Pain      Home Medications Prior to Admission medications   Medication Sig Start Date End Date Taking? Authorizing Provider  acetaminophen (TYLENOL) 500 MG tablet Take 500 mg by mouth every 6 (six) hours as needed for moderate pain.    [provider]  ALBUTEROL SULFATE PO Take 2 puffs by mouth every 4 (four) hours as needed (Wheezing/SOB).    [provider]  busPIRone (BUSPAR) 5 MG tablet Take 1 tablet (5 mg total) by mouth 3 (three) times daily. 04/29/20   Medina-Vargas, Monina C, NP  donepezil (ARICEPT) 10 MG tablet Take 1 tablet (10 mg total) by mouth at bedtime. 09/05/20   Nita Sells, MD  memantine (NAMENDA XR) 28 MG CP24 24 hr capsule Take 1 capsule (28 mg total) by mouth every morning. 09/05/20   Nita Sells, MD      Allergies    Aspirin    Review of Systems     Physical Exam Updated Vital Signs BP (!) 134/108   Pulse 60   Temp (!) 97.5 F (36.4 C) (Oral)   Resp 12   Ht '5\' 4"'$  (1.626 m)   Wt 46.7 kg   SpO2 100%   BMI 17.68 kg/m  Physical Exam Vitals and nursing note reviewed.  Constitutional:      General: She is not in acute distress.    Appearance: She is well-developed.  HENT:     Head: Normocephalic and atraumatic.     Mouth/Throat:     Mouth: Mucous membranes are moist.  Eyes:      Conjunctiva/sclera: Conjunctivae normal.  Cardiovascular:     Rate and Rhythm: Normal rate and regular rhythm.     Heart sounds: No murmur heard. Pulmonary:     Effort: Pulmonary effort is normal. No respiratory distress.     Breath sounds: Normal breath sounds.  Abdominal:     General: Abdomen is flat. Bowel sounds are normal.     Palpations: Abdomen is soft.     Tenderness: There is no abdominal tenderness.  Musculoskeletal:        General: No swelling.     Cervical back: Neck supple.  Skin:    General: Skin is warm and dry.     Capillary Refill: Capillary refill takes less than 2 seconds.  Neurological:     Mental Status: She is alert.  Psychiatric:        Mood and Affect: Mood normal.     ED Results / Procedures / Treatments   Labs (all labs ordered are listed, but only abnormal results are displayed) Labs Reviewed  CBC - Abnormal; Notable for the following components:      Result Value   RBC 3.42 (*)    Hemoglobin 10.8 (*)    HCT 34.5 (*)    MCV 100.9 (*)  All other components within normal limits  URINALYSIS, ROUTINE W REFLEX MICROSCOPIC - Abnormal; Notable for the following components:   Ketones, ur 5 (*)    All other components within normal limits  COMPREHENSIVE METABOLIC PANEL - Abnormal; Notable for the following components:   Potassium 5.2 (*)    CO2 21 (*)    Glucose, Bld 128 (*)    BUN 42 (*)    Creatinine, Ser 1.69 (*)    Albumin 3.1 (*)    GFR, Estimated 29 (*)    All other components within normal limits  RESP PANEL BY RT-PCR (RSV, FLU A&B, COVID)  [id number]  LIPASE, BLOOD    EKG None  Radiology No results found. Medications Ordered in ED Medications  lactated ringers bolus 1,000 mL (0 mLs Intravenous Stopped 05/28/22 2127)    ED Course/ Medical Decision Making/ A&P   {  Medical Decision Making Amount and/or Complexity of Data Reviewed Labs: ordered.   Patient presented complaining of malodorous urine past week, denies hematuria, but  is endorsing some dysuria.  She has no abdominal or pelvic pain.  Urinalysis was negative for signs of infection hematuria.  CBC was negative for leukocytosis, patient does have anemia but this is at her baseline.  She does have a small AKI as well as ketones in her urine suggesting that she is dehydrated.  She has a decreased p.o. intake over the past few days.  Was given 1 L bolus with improvement.  [name] was contacted and transport upon be arranged for her discharge. Final Clinical Impression(s) / ED Diagnoses Final diagnoses:  Malodorous urine  Dehydration     Bradd Canary, MD 05/28/22 6269    Noemi Chapel, MD 05/29/22 1447

## 2022-05-28 NOTE — ED Notes (Signed)
Repeat light green sent to lab due to not enough in first draw

## 2022-05-28 NOTE — ED Notes (Signed)
Patient tolerated PO challenge well.

## 2022-05-28 NOTE — Discharge Instructions (Addendum)
Your urine analysis did not show signs of infection at this time, your labs and urine did show that you were possibly dehydrated.  You were given some fluid while here in the emergency department. If you have any recurring or severe abdominal pain or chest pain, difficulty breathing, blood in your urine, burning when you urinate, fevers, intractable vomiting please return to the emergency department.  Otherwise you can follow-up with your PCP in a few days to ensure you are still doing well.

## 2022-05-28 NOTE — ED Triage Notes (Signed)
Pt BIBA from home. Pt has hx of dementia. A&ox 3 (no time), which is baseline. Pt has been c/o not feeling well. Pt c/o generalized abd pain x 1 day. Denies N/V/D. Pt has had malodorous urine x 1 week    EMS VS: 126/74 64 HR 16 RR 98% 103 CBG

## 2022-05-28 NOTE — ED Notes (Signed)
Ptar called 

## 2022-05-28 NOTE — ED Notes (Signed)
Patient given diet coke for PO challenge.

## 2022-05-28 NOTE — ED Notes (Signed)
Josiphine [id number] [phone]

## 2022-05-29 DIAGNOSIS — Z743 Need for continuous supervision: Secondary | ICD-10-CM | POA: Diagnosis not present

## 2022-05-29 DIAGNOSIS — R109 Unspecified abdominal pain: Secondary | ICD-10-CM | POA: Diagnosis not present

## 2022-05-29 NOTE — ED Notes (Signed)
PTAR at bedside 

## 2022-05-29 NOTE — ED Notes (Signed)
[name] called about patient being transported back home, no answer.

## 2022-06-01 DIAGNOSIS — E1140 Type 2 diabetes mellitus with diabetic neuropathy, unspecified: Secondary | ICD-10-CM | POA: Diagnosis not present

## 2022-06-01 DIAGNOSIS — F0393 Unspecified dementia, unspecified severity, with mood disturbance: Secondary | ICD-10-CM | POA: Diagnosis not present

## 2022-06-01 DIAGNOSIS — Z8616 Personal history of COVID-19: Secondary | ICD-10-CM | POA: Diagnosis not present

## 2022-06-01 DIAGNOSIS — N1832 Chronic kidney disease, stage 3b: Secondary | ICD-10-CM | POA: Diagnosis not present

## 2022-06-01 DIAGNOSIS — I5032 Chronic diastolic (congestive) heart failure: Secondary | ICD-10-CM | POA: Diagnosis not present

## 2022-06-01 DIAGNOSIS — Z87440 Personal history of urinary (tract) infections: Secondary | ICD-10-CM | POA: Diagnosis not present

## 2022-06-01 DIAGNOSIS — K56600 Partial intestinal obstruction, unspecified as to cause: Secondary | ICD-10-CM | POA: Diagnosis not present

## 2022-06-01 DIAGNOSIS — Z950 Presence of cardiac pacemaker: Secondary | ICD-10-CM | POA: Diagnosis not present

## 2022-06-01 DIAGNOSIS — E1122 Type 2 diabetes mellitus with diabetic chronic kidney disease: Secondary | ICD-10-CM | POA: Diagnosis not present

## 2022-06-01 DIAGNOSIS — S91114D Laceration without foreign body of right lesser toe(s) without damage to nail, subsequent encounter: Secondary | ICD-10-CM | POA: Diagnosis not present

## 2022-06-01 DIAGNOSIS — I130 Hypertensive heart and chronic kidney disease with heart failure and stage 1 through stage 4 chronic kidney disease, or unspecified chronic kidney disease: Secondary | ICD-10-CM | POA: Diagnosis not present

## 2022-06-01 DIAGNOSIS — D509 Iron deficiency anemia, unspecified: Secondary | ICD-10-CM | POA: Diagnosis not present

## 2022-06-01 DIAGNOSIS — J4489 Other specified chronic obstructive pulmonary disease: Secondary | ICD-10-CM | POA: Diagnosis not present

## 2022-06-01 DIAGNOSIS — Z9181 History of falling: Secondary | ICD-10-CM | POA: Diagnosis not present

## 2022-06-01 DIAGNOSIS — F32A Depression, unspecified: Secondary | ICD-10-CM | POA: Diagnosis not present

## 2022-06-01 DIAGNOSIS — D631 Anemia in chronic kidney disease: Secondary | ICD-10-CM | POA: Diagnosis not present

## 2022-06-01 DIAGNOSIS — Z933 Colostomy status: Secondary | ICD-10-CM | POA: Diagnosis not present

## 2022-06-01 DIAGNOSIS — E43 Unspecified severe protein-calorie malnutrition: Secondary | ICD-10-CM | POA: Diagnosis not present

## 2022-06-01 DIAGNOSIS — G9340 Encephalopathy, unspecified: Secondary | ICD-10-CM | POA: Diagnosis not present

## 2022-06-02 DIAGNOSIS — E43 Unspecified severe protein-calorie malnutrition: Secondary | ICD-10-CM | POA: Diagnosis not present

## 2022-06-02 DIAGNOSIS — D631 Anemia in chronic kidney disease: Secondary | ICD-10-CM | POA: Diagnosis not present

## 2022-06-02 DIAGNOSIS — Z8616 Personal history of COVID-19: Secondary | ICD-10-CM | POA: Diagnosis not present

## 2022-06-02 DIAGNOSIS — S91114D Laceration without foreign body of right lesser toe(s) without damage to nail, subsequent encounter: Secondary | ICD-10-CM | POA: Diagnosis not present

## 2022-06-02 DIAGNOSIS — Z950 Presence of cardiac pacemaker: Secondary | ICD-10-CM | POA: Diagnosis not present

## 2022-06-02 DIAGNOSIS — I130 Hypertensive heart and chronic kidney disease with heart failure and stage 1 through stage 4 chronic kidney disease, or unspecified chronic kidney disease: Secondary | ICD-10-CM | POA: Diagnosis not present

## 2022-06-02 DIAGNOSIS — K56600 Partial intestinal obstruction, unspecified as to cause: Secondary | ICD-10-CM | POA: Diagnosis not present

## 2022-06-02 DIAGNOSIS — E1140 Type 2 diabetes mellitus with diabetic neuropathy, unspecified: Secondary | ICD-10-CM | POA: Diagnosis not present

## 2022-06-02 DIAGNOSIS — G9340 Encephalopathy, unspecified: Secondary | ICD-10-CM | POA: Diagnosis not present

## 2022-06-02 DIAGNOSIS — F32A Depression, unspecified: Secondary | ICD-10-CM | POA: Diagnosis not present

## 2022-06-02 DIAGNOSIS — I5032 Chronic diastolic (congestive) heart failure: Secondary | ICD-10-CM | POA: Diagnosis not present

## 2022-06-02 DIAGNOSIS — N1832 Chronic kidney disease, stage 3b: Secondary | ICD-10-CM | POA: Diagnosis not present

## 2022-06-02 DIAGNOSIS — Z87440 Personal history of urinary (tract) infections: Secondary | ICD-10-CM | POA: Diagnosis not present

## 2022-06-02 DIAGNOSIS — F0393 Unspecified dementia, unspecified severity, with mood disturbance: Secondary | ICD-10-CM | POA: Diagnosis not present

## 2022-06-02 DIAGNOSIS — E1122 Type 2 diabetes mellitus with diabetic chronic kidney disease: Secondary | ICD-10-CM | POA: Diagnosis not present

## 2022-06-02 DIAGNOSIS — Z933 Colostomy status: Secondary | ICD-10-CM | POA: Diagnosis not present

## 2022-06-02 DIAGNOSIS — Z9181 History of falling: Secondary | ICD-10-CM | POA: Diagnosis not present

## 2022-06-02 DIAGNOSIS — J4489 Other specified chronic obstructive pulmonary disease: Secondary | ICD-10-CM | POA: Diagnosis not present

## 2022-06-02 DIAGNOSIS — D509 Iron deficiency anemia, unspecified: Secondary | ICD-10-CM | POA: Diagnosis not present

## 2022-06-09 DIAGNOSIS — E1140 Type 2 diabetes mellitus with diabetic neuropathy, unspecified: Secondary | ICD-10-CM | POA: Diagnosis not present

## 2022-06-09 DIAGNOSIS — I5032 Chronic diastolic (congestive) heart failure: Secondary | ICD-10-CM | POA: Diagnosis not present

## 2022-06-09 DIAGNOSIS — F32A Depression, unspecified: Secondary | ICD-10-CM | POA: Diagnosis not present

## 2022-06-09 DIAGNOSIS — Z933 Colostomy status: Secondary | ICD-10-CM | POA: Diagnosis not present

## 2022-06-09 DIAGNOSIS — F0393 Unspecified dementia, unspecified severity, with mood disturbance: Secondary | ICD-10-CM | POA: Diagnosis not present

## 2022-06-09 DIAGNOSIS — Z87440 Personal history of urinary (tract) infections: Secondary | ICD-10-CM | POA: Diagnosis not present

## 2022-06-09 DIAGNOSIS — D631 Anemia in chronic kidney disease: Secondary | ICD-10-CM | POA: Diagnosis not present

## 2022-06-09 DIAGNOSIS — Z8616 Personal history of COVID-19: Secondary | ICD-10-CM | POA: Diagnosis not present

## 2022-06-09 DIAGNOSIS — K631 Perforation of intestine (nontraumatic): Secondary | ICD-10-CM | POA: Diagnosis not present

## 2022-06-09 DIAGNOSIS — K56600 Partial intestinal obstruction, unspecified as to cause: Secondary | ICD-10-CM | POA: Diagnosis not present

## 2022-06-09 DIAGNOSIS — Z950 Presence of cardiac pacemaker: Secondary | ICD-10-CM | POA: Diagnosis not present

## 2022-06-09 DIAGNOSIS — I130 Hypertensive heart and chronic kidney disease with heart failure and stage 1 through stage 4 chronic kidney disease, or unspecified chronic kidney disease: Secondary | ICD-10-CM | POA: Diagnosis not present

## 2022-06-09 DIAGNOSIS — E43 Unspecified severe protein-calorie malnutrition: Secondary | ICD-10-CM | POA: Diagnosis not present

## 2022-06-09 DIAGNOSIS — D509 Iron deficiency anemia, unspecified: Secondary | ICD-10-CM | POA: Diagnosis not present

## 2022-06-09 DIAGNOSIS — S91114D Laceration without foreign body of right lesser toe(s) without damage to nail, subsequent encounter: Secondary | ICD-10-CM | POA: Diagnosis not present

## 2022-06-09 DIAGNOSIS — J4489 Other specified chronic obstructive pulmonary disease: Secondary | ICD-10-CM | POA: Diagnosis not present

## 2022-06-09 DIAGNOSIS — N1832 Chronic kidney disease, stage 3b: Secondary | ICD-10-CM | POA: Diagnosis not present

## 2022-06-09 DIAGNOSIS — G9340 Encephalopathy, unspecified: Secondary | ICD-10-CM | POA: Diagnosis not present

## 2022-06-09 DIAGNOSIS — Z9181 History of falling: Secondary | ICD-10-CM | POA: Diagnosis not present

## 2022-06-09 DIAGNOSIS — E1122 Type 2 diabetes mellitus with diabetic chronic kidney disease: Secondary | ICD-10-CM | POA: Diagnosis not present

## 2022-06-16 DIAGNOSIS — D631 Anemia in chronic kidney disease: Secondary | ICD-10-CM | POA: Diagnosis not present

## 2022-06-16 DIAGNOSIS — I5032 Chronic diastolic (congestive) heart failure: Secondary | ICD-10-CM | POA: Diagnosis not present

## 2022-06-16 DIAGNOSIS — I130 Hypertensive heart and chronic kidney disease with heart failure and stage 1 through stage 4 chronic kidney disease, or unspecified chronic kidney disease: Secondary | ICD-10-CM | POA: Diagnosis not present

## 2022-06-16 DIAGNOSIS — D509 Iron deficiency anemia, unspecified: Secondary | ICD-10-CM | POA: Diagnosis not present

## 2022-06-16 DIAGNOSIS — E1140 Type 2 diabetes mellitus with diabetic neuropathy, unspecified: Secondary | ICD-10-CM | POA: Diagnosis not present

## 2022-06-16 DIAGNOSIS — Z9181 History of falling: Secondary | ICD-10-CM | POA: Diagnosis not present

## 2022-06-16 DIAGNOSIS — Z933 Colostomy status: Secondary | ICD-10-CM | POA: Diagnosis not present

## 2022-06-16 DIAGNOSIS — E1122 Type 2 diabetes mellitus with diabetic chronic kidney disease: Secondary | ICD-10-CM | POA: Diagnosis not present

## 2022-06-16 DIAGNOSIS — J4489 Other specified chronic obstructive pulmonary disease: Secondary | ICD-10-CM | POA: Diagnosis not present

## 2022-06-16 DIAGNOSIS — Z8616 Personal history of COVID-19: Secondary | ICD-10-CM | POA: Diagnosis not present

## 2022-06-16 DIAGNOSIS — F32A Depression, unspecified: Secondary | ICD-10-CM | POA: Diagnosis not present

## 2022-06-16 DIAGNOSIS — K56600 Partial intestinal obstruction, unspecified as to cause: Secondary | ICD-10-CM | POA: Diagnosis not present

## 2022-06-16 DIAGNOSIS — Z950 Presence of cardiac pacemaker: Secondary | ICD-10-CM | POA: Diagnosis not present

## 2022-06-16 DIAGNOSIS — Z87440 Personal history of urinary (tract) infections: Secondary | ICD-10-CM | POA: Diagnosis not present

## 2022-06-16 DIAGNOSIS — N1832 Chronic kidney disease, stage 3b: Secondary | ICD-10-CM | POA: Diagnosis not present

## 2022-06-16 DIAGNOSIS — E43 Unspecified severe protein-calorie malnutrition: Secondary | ICD-10-CM | POA: Diagnosis not present

## 2022-06-16 DIAGNOSIS — G9340 Encephalopathy, unspecified: Secondary | ICD-10-CM | POA: Diagnosis not present

## 2022-06-16 DIAGNOSIS — F0393 Unspecified dementia, unspecified severity, with mood disturbance: Secondary | ICD-10-CM | POA: Diagnosis not present

## 2022-06-16 DIAGNOSIS — S91114D Laceration without foreign body of right lesser toe(s) without damage to nail, subsequent encounter: Secondary | ICD-10-CM | POA: Diagnosis not present

## 2022-06-19 DIAGNOSIS — Z933 Colostomy status: Secondary | ICD-10-CM | POA: Diagnosis not present

## 2022-06-19 DIAGNOSIS — K631 Perforation of intestine (nontraumatic): Secondary | ICD-10-CM | POA: Diagnosis not present

## 2022-06-23 ENCOUNTER — Emergency Department (HOSPITAL_COMMUNITY): Payer: 59

## 2022-06-23 ENCOUNTER — Emergency Department (HOSPITAL_COMMUNITY)
Admission: EM | Admit: 2022-06-23 | Discharge: 2022-06-24 | Disposition: A | Discharge status: Home / Self Care | Payer: 59 | Attending: Emergency Medicine | Admitting: Emergency Medicine

## 2022-06-23 ENCOUNTER — Other Ambulatory Visit: Payer: Self-pay

## 2022-06-23 DIAGNOSIS — R600 Localized edema: Secondary | ICD-10-CM | POA: Insufficient documentation

## 2022-06-23 DIAGNOSIS — R6889 Other general symptoms and signs: Secondary | ICD-10-CM | POA: Diagnosis not present

## 2022-06-23 DIAGNOSIS — N189 Chronic kidney disease, unspecified: Secondary | ICD-10-CM | POA: Insufficient documentation

## 2022-06-23 DIAGNOSIS — R7989 Other specified abnormal findings of blood chemistry: Secondary | ICD-10-CM | POA: Diagnosis not present

## 2022-06-23 DIAGNOSIS — R739 Hyperglycemia, unspecified: Secondary | ICD-10-CM | POA: Diagnosis not present

## 2022-06-23 DIAGNOSIS — F0390 Unspecified dementia without behavioral disturbance: Secondary | ICD-10-CM | POA: Insufficient documentation

## 2022-06-23 DIAGNOSIS — I130 Hypertensive heart and chronic kidney disease with heart failure and stage 1 through stage 4 chronic kidney disease, or unspecified chronic kidney disease: Secondary | ICD-10-CM | POA: Diagnosis not present

## 2022-06-23 DIAGNOSIS — R531 Weakness: Secondary | ICD-10-CM | POA: Diagnosis not present

## 2022-06-23 DIAGNOSIS — Z743 Need for continuous supervision: Secondary | ICD-10-CM | POA: Diagnosis not present

## 2022-06-23 DIAGNOSIS — E119 Type 2 diabetes mellitus without complications: Secondary | ICD-10-CM | POA: Insufficient documentation

## 2022-06-23 DIAGNOSIS — I5030 Unspecified diastolic (congestive) heart failure: Secondary | ICD-10-CM | POA: Diagnosis not present

## 2022-06-23 DIAGNOSIS — Z20822 Contact with and (suspected) exposure to covid-19: Secondary | ICD-10-CM | POA: Insufficient documentation

## 2022-06-23 LAB — COMPREHENSIVE METABOLIC PANEL
ALT: 13 U/L (ref 0–44)
AST: 20 U/L (ref 15–41)
Albumin: 3.1 g/dL — ABNORMAL LOW (ref 3.5–5.0)
Alkaline Phosphatase: 69 U/L (ref 38–126)
Anion gap: 11 (ref 5–15)
BUN: 29 mg/dL — ABNORMAL HIGH (ref 8–23)
CO2: 20 mmol/L — ABNORMAL LOW (ref 22–32)
Calcium: 8.7 mg/dL — ABNORMAL LOW (ref 8.9–10.3)
Chloride: 104 mmol/L (ref 98–111)
Creatinine, Ser: 1.47 mg/dL — ABNORMAL HIGH (ref 0.44–1.00)
GFR, Estimated: 35 mL/min — ABNORMAL LOW (ref >60)
Glucose, Bld: 155 mg/dL — ABNORMAL HIGH (ref 70–99)
Potassium: 4.7 mmol/L (ref 3.5–5.1)
Sodium: 135 mmol/L (ref 135–145)
Total Bilirubin: 0.4 mg/dL (ref 0.3–1.2)
Total Protein: 6.7 g/dL (ref 6.5–8.1)

## 2022-06-23 LAB — CBC WITH DIFFERENTIAL/PLATELET
Abs Immature Granulocytes: 0.02 10*3/uL (ref 0.00–0.07)
Basophils Absolute: 0 10*3/uL (ref 0.0–0.1)
Basophils Relative: 0 %
Eosinophils Absolute: 0.1 10*3/uL (ref 0.0–0.5)
Eosinophils Relative: 2 %
HCT: 34.8 % — ABNORMAL LOW (ref 36.0–46.0)
Hemoglobin: 11.2 g/dL — ABNORMAL LOW (ref 12.0–15.0)
Immature Granulocytes: 0 %
Lymphocytes Relative: 13 %
Lymphs Abs: 0.7 10*3/uL (ref 0.7–4.0)
MCH: 30.9 pg (ref 26.0–34.0)
MCHC: 32.2 g/dL (ref 30.0–36.0)
MCV: 96.1 fL (ref 80.0–100.0)
Monocytes Absolute: 0.5 10*3/uL (ref 0.1–1.0)
Monocytes Relative: 8 %
Neutro Abs: 4.5 10*3/uL (ref 1.7–7.7)
Neutrophils Relative %: 77 %
Platelets: 156 10*3/uL (ref 150–400)
RBC: 3.62 MIL/uL — ABNORMAL LOW (ref 3.87–5.11)
RDW: 12.2 % (ref 11.5–15.5)
WBC: 5.8 10*3/uL (ref 4.0–10.5)
nRBC: 0 % (ref 0.0–0.2)

## 2022-06-23 LAB — URINALYSIS, ROUTINE W REFLEX MICROSCOPIC
Bacteria, UA: NONE SEEN
Bilirubin Urine: NEGATIVE
Glucose, UA: NEGATIVE mg/dL
Ketones, ur: NEGATIVE mg/dL
Leukocytes,Ua: NEGATIVE
Nitrite: NEGATIVE
Protein, ur: NEGATIVE mg/dL
Specific Gravity, Urine: 1.008 (ref 1.005–1.030)
pH: 5 (ref 5.0–8.0)

## 2022-06-23 LAB — RESP PANEL BY RT-PCR (RSV, FLU A&B, COVID)  RVPGX2
Influenza A by PCR: NEGATIVE
Influenza B by PCR: NEGATIVE
Resp Syncytial Virus by PCR: NEGATIVE
SARS Coronavirus 2 by RT PCR: NEGATIVE

## 2022-06-23 LAB — LACTIC ACID, PLASMA
Lactic Acid, Venous: 4.7 mmol/L (ref 0.5–1.9)
Lactic Acid, Venous: 4.1 mmol/L (ref 0.5–1.9)

## 2022-06-23 MED ORDER — LACTATED RINGERS IV BOLUS
500.0000 mL | Freq: Once | INTRAVENOUS | Status: AC
  Administered 2022-06-23: 500 mL via INTRAVENOUS

## 2022-06-23 NOTE — Progress Notes (Signed)
Noted consult for IV discontinued prior to VAS Team arrival. IV team available for any future needs.

## 2022-06-23 NOTE — ED Notes (Signed)
Pt refused bed pan at this time and states she does not need to urinate. She reports that she is continent of urine at home. When placing pt in gown, her clothes were wet with foul-smelling urine.

## 2022-06-23 NOTE — ED Triage Notes (Signed)
Pt via EMS from home with family reporting generalized weakness and possible UTI. Pt is at normal responsiveness per family, a/o x 2-3. Vitals WNL, pacermaker noted. EMS suggests social work consult due to poor living conditions noted in the home with unclean living conditions and strong smell of urine throughout the living space. Pt has a strong foul urine odor notable during triage. No pain.

## 2022-06-23 NOTE — ED Notes (Signed)
Attempted blood draw x 2 without success. Will request additional assistance obtaining labs

## 2022-06-23 NOTE — Progress Notes (Signed)
Received consult for IV. Review of flowsheet shows recent peripheral IV. Messaged RN; RN stated current IV positional. IV team will assess pt.

## 2022-06-23 NOTE — ED Notes (Signed)
EDP at bedside  

## 2022-06-23 NOTE — ED Provider Notes (Incomplete)
[place] EMERGENCY DEPARTMENT AT [facility] Provider Note   CSN: [id number] Arrival date & time: 06/23/22  1726     History {Add pertinent medical, surgical, social history, OB history to HPI:1} Chief Complaint  Patient presents with   Weakness    [name] is a 89 y.o. female.  Pt is a 88y/o female with hx of DM, HTN, dementia (at baseline), CKD, COPD, HFpEF (last EF 65 to 70% December 2023) who is presenting today from home by ambulance due to weakness.  When asking the patient who is here alone why she is here she said she does not know but then she just reports she is sick.  She said she coughed a few times today but denies feeling short of breath, chest pain, abdominal pain, nausea vomiting or diarrhea.  She does have an ostomy bag and reports that the output has been normal.  She does report feeling generally weak but denies any unilateral weakness.  She reports that she is eating.  She lives with her granddaughter who she reports that she feels takes good care of her.  EMS reported that the house did smell strongly of urine and living conditions were not great but patient does not have any complaints.  She usually gets around with a walker and reports she did get out of bed today and use her walker.  She was seen a few weeks ago for similar complaints and at that time had mild AKI but otherwise lab workup was normal.  The history is provided by the patient, the EMS personnel and medical records.  Weakness      Home Medications Prior to Admission medications   Medication Sig Start Date End Date Taking? Authorizing Provider  acetaminophen (TYLENOL) 500 MG tablet Take 500 mg by mouth every 6 (six) hours as needed for moderate pain.    [provider]  ALBUTEROL SULFATE PO Take 2 puffs by mouth every 4 (four) hours as needed (Wheezing/SOB).    [provider]  busPIRone (BUSPAR) 5 MG tablet Take 1 tablet (5 mg total) by mouth 3 (three) times daily.  04/29/20   Medina-Vargas, Monina C, NP  donepezil (ARICEPT) 10 MG tablet Take 1 tablet (10 mg total) by mouth at bedtime. 09/05/20   Rhetta Mura, MD  memantine (NAMENDA XR) 28 MG CP24 24 hr capsule Take 1 capsule (28 mg total) by mouth every morning. 09/05/20   Rhetta Mura, MD      Allergies    Aspirin    Review of Systems   Review of Systems  Neurological:  Positive for weakness.    Physical Exam Updated Vital Signs BP (!) 148/68 (BP Location: Right Arm)   Pulse 68   Temp 97.8 F (36.6 C) (Oral)   Resp 16   Ht 5\' 4"  (1.626 m)   Wt 46.7 kg   SpO2 100%   BMI 17.68 kg/m  Physical Exam Vitals and nursing note reviewed.  Constitutional:      General: She is not in acute distress.    Appearance: She is well-developed.     Comments: Mildly disheveled  HENT:     Head: Normocephalic and atraumatic.     Nose: Rhinorrhea present.  Eyes:     Pupils: Pupils are equal, round, and reactive to light.  Cardiovascular:     Rate and Rhythm: Normal rate and regular rhythm.     Heart sounds: Normal heart sounds. No murmur heard.    No friction rub.  Pulmonary:     Effort: Pulmonary effort is normal.     Breath sounds: Normal breath sounds. No wheezing or rales.  Abdominal:     General: Bowel sounds are normal. There is no distension.     Palpations: Abdomen is soft.     Tenderness: There is no abdominal tenderness. There is no guarding or rebound.     Comments: Ostomy present in the left quadrant with stool in the bag.  No stomal hernias  Musculoskeletal:        General: No tenderness. Normal range of motion.     Comments: Minimal nonpitting edema in the ankles bilaterally  Skin:    General: Skin is warm and dry.     Findings: No rash.  Neurological:     Mental Status: She is alert and oriented to person, place, and time.     Cranial Nerves: No cranial nerve deficit.     Comments: Seems to have some trouble pulling up herself in bed and sitting up but is able to do  it.  Strength is equal bilaterally in the upper and lower extremities  Psychiatric:        Behavior: Behavior normal.     ED Results / Procedures / Treatments   Labs (all labs ordered are listed, but only abnormal results are displayed) Labs Reviewed  CBC WITH DIFFERENTIAL/PLATELET  URINALYSIS, ROUTINE W REFLEX MICROSCOPIC  LACTIC ACID, PLASMA  LACTIC ACID, PLASMA  COMPREHENSIVE METABOLIC PANEL    EKG None  Radiology No results found.  Procedures Procedures  {Document cardiac monitor, telemetry assessment procedure when appropriate:1}  Medications Ordered in ED Medications - No data to display  ED Course/ Medical Decision Making/ A&P   {   Click here for ABCD2, HEART and other calculatorsREFRESH Note before signing :1}                          Medical Decision Making Amount and/or Complexity of Data Reviewed Labs: ordered. Radiology: ordered.   Pt with multiple medical problems and comorbidities and presenting today with a complaint that caries a high risk for morbidity and mortality.  Patient presenting today with EMS due to generalized weakness.  Patient does not have any specific complaints.  Family did report that she seemed to be at her baseline mental status.  Patient has no localized pain, unilateral neurologic deficits.  She is awake alert and answering questions.  Vital signs are reassuring.  Will evaluate for electrolyte abnormalities, new kidney injury.  I independently interpreted patient's EKG which shows no evidence of dysrhythmia.  Will also look for infectious etiology.   {Document critical care time when appropriate:1} {Document review of labs and clinical decision tools ie heart score, Chads2Vasc2 etc:1}  {Document your independent review of radiology images, and any outside records:1} {Document your discussion with family members, caretakers, and with consultants:1} {Document social determinants of health affecting pt's care:1} {Document your  decision making why or why not admission, treatments were needed:1} Final Clinical Impression(s) / ED Diagnoses Final diagnoses:  None    Rx / DC Orders ED Discharge Orders     None

## 2022-06-24 DIAGNOSIS — Z743 Need for continuous supervision: Secondary | ICD-10-CM | POA: Diagnosis not present

## 2022-06-24 DIAGNOSIS — Z7401 Bed confinement status: Secondary | ICD-10-CM | POA: Diagnosis not present

## 2022-06-24 DIAGNOSIS — R531 Weakness: Secondary | ICD-10-CM | POA: Diagnosis not present

## 2022-06-24 LAB — LACTIC ACID, PLASMA: Lactic Acid, Venous: 1.7 mmol/L (ref 0.5–1.9)

## 2022-06-24 NOTE — ED Provider Notes (Signed)
I assumed care of patient.  Repeat lactate is improved.  Patient is in no acute distress.  Overall workup is reassuring. Patient denies any complaints.  She feels safe for discharge. I have attempted to call the granddaughter who is listed but no answer.   Zadie Rhine, MD 06/24/22 [phone]

## 2022-06-24 NOTE — Discharge Instructions (Addendum)

## 2022-06-24 NOTE — ED Notes (Signed)
Ptar called 

## 2022-06-25 DIAGNOSIS — E43 Unspecified severe protein-calorie malnutrition: Secondary | ICD-10-CM | POA: Diagnosis not present

## 2022-06-25 DIAGNOSIS — D509 Iron deficiency anemia, unspecified: Secondary | ICD-10-CM | POA: Diagnosis not present

## 2022-06-25 DIAGNOSIS — G9340 Encephalopathy, unspecified: Secondary | ICD-10-CM | POA: Diagnosis not present

## 2022-06-25 DIAGNOSIS — E1140 Type 2 diabetes mellitus with diabetic neuropathy, unspecified: Secondary | ICD-10-CM | POA: Diagnosis not present

## 2022-06-25 DIAGNOSIS — Z9181 History of falling: Secondary | ICD-10-CM | POA: Diagnosis not present

## 2022-06-25 DIAGNOSIS — F32A Depression, unspecified: Secondary | ICD-10-CM | POA: Diagnosis not present

## 2022-06-25 DIAGNOSIS — F0393 Unspecified dementia, unspecified severity, with mood disturbance: Secondary | ICD-10-CM | POA: Diagnosis not present

## 2022-06-25 DIAGNOSIS — Z933 Colostomy status: Secondary | ICD-10-CM | POA: Diagnosis not present

## 2022-06-25 DIAGNOSIS — Z950 Presence of cardiac pacemaker: Secondary | ICD-10-CM | POA: Diagnosis not present

## 2022-06-25 DIAGNOSIS — S91114D Laceration without foreign body of right lesser toe(s) without damage to nail, subsequent encounter: Secondary | ICD-10-CM | POA: Diagnosis not present

## 2022-06-25 DIAGNOSIS — I130 Hypertensive heart and chronic kidney disease with heart failure and stage 1 through stage 4 chronic kidney disease, or unspecified chronic kidney disease: Secondary | ICD-10-CM | POA: Diagnosis not present

## 2022-06-25 DIAGNOSIS — J4489 Other specified chronic obstructive pulmonary disease: Secondary | ICD-10-CM | POA: Diagnosis not present

## 2022-06-25 DIAGNOSIS — Z87440 Personal history of urinary (tract) infections: Secondary | ICD-10-CM | POA: Diagnosis not present

## 2022-06-25 DIAGNOSIS — D631 Anemia in chronic kidney disease: Secondary | ICD-10-CM | POA: Diagnosis not present

## 2022-06-25 DIAGNOSIS — N1832 Chronic kidney disease, stage 3b: Secondary | ICD-10-CM | POA: Diagnosis not present

## 2022-06-25 DIAGNOSIS — E1122 Type 2 diabetes mellitus with diabetic chronic kidney disease: Secondary | ICD-10-CM | POA: Diagnosis not present

## 2022-06-25 DIAGNOSIS — Z8616 Personal history of COVID-19: Secondary | ICD-10-CM | POA: Diagnosis not present

## 2022-06-25 DIAGNOSIS — I5032 Chronic diastolic (congestive) heart failure: Secondary | ICD-10-CM | POA: Diagnosis not present

## 2022-06-25 DIAGNOSIS — K56600 Partial intestinal obstruction, unspecified as to cause: Secondary | ICD-10-CM | POA: Diagnosis not present

## 2022-06-29 ENCOUNTER — Emergency Department (HOSPITAL_COMMUNITY): Payer: 59

## 2022-06-29 ENCOUNTER — Ambulatory Visit (INDEPENDENT_AMBULATORY_CARE_PROVIDER_SITE_OTHER): Payer: 59

## 2022-06-29 ENCOUNTER — Encounter (HOSPITAL_COMMUNITY): Payer: Self-pay

## 2022-06-29 ENCOUNTER — Other Ambulatory Visit: Payer: Self-pay

## 2022-06-29 ENCOUNTER — Emergency Department (HOSPITAL_COMMUNITY)
Admission: EM | Admit: 2022-06-29 | Discharge: 2022-06-29 | Disposition: A | Discharge status: Home / Self Care | Payer: 59 | Attending: Emergency Medicine | Admitting: Emergency Medicine

## 2022-06-29 DIAGNOSIS — I5032 Chronic diastolic (congestive) heart failure: Secondary | ICD-10-CM

## 2022-06-29 DIAGNOSIS — R404 Transient alteration of awareness: Secondary | ICD-10-CM | POA: Diagnosis not present

## 2022-06-29 DIAGNOSIS — R059 Cough, unspecified: Secondary | ICD-10-CM | POA: Diagnosis not present

## 2022-06-29 DIAGNOSIS — R55 Syncope and collapse: Secondary | ICD-10-CM | POA: Insufficient documentation

## 2022-06-29 DIAGNOSIS — J449 Chronic obstructive pulmonary disease, unspecified: Secondary | ICD-10-CM | POA: Diagnosis not present

## 2022-06-29 DIAGNOSIS — J45909 Unspecified asthma, uncomplicated: Secondary | ICD-10-CM | POA: Insufficient documentation

## 2022-06-29 DIAGNOSIS — R531 Weakness: Secondary | ICD-10-CM | POA: Diagnosis not present

## 2022-06-29 DIAGNOSIS — F0390 Unspecified dementia without behavioral disturbance: Secondary | ICD-10-CM | POA: Insufficient documentation

## 2022-06-29 DIAGNOSIS — E1140 Type 2 diabetes mellitus with diabetic neuropathy, unspecified: Secondary | ICD-10-CM | POA: Diagnosis not present

## 2022-06-29 DIAGNOSIS — N1832 Chronic kidney disease, stage 3b: Secondary | ICD-10-CM | POA: Diagnosis not present

## 2022-06-29 DIAGNOSIS — R6889 Other general symptoms and signs: Secondary | ICD-10-CM | POA: Diagnosis not present

## 2022-06-29 DIAGNOSIS — I499 Cardiac arrhythmia, unspecified: Secondary | ICD-10-CM | POA: Diagnosis not present

## 2022-06-29 DIAGNOSIS — Z743 Need for continuous supervision: Secondary | ICD-10-CM | POA: Diagnosis not present

## 2022-06-29 DIAGNOSIS — I130 Hypertensive heart and chronic kidney disease with heart failure and stage 1 through stage 4 chronic kidney disease, or unspecified chronic kidney disease: Secondary | ICD-10-CM | POA: Diagnosis not present

## 2022-06-29 DIAGNOSIS — Z7401 Bed confinement status: Secondary | ICD-10-CM | POA: Diagnosis not present

## 2022-06-29 LAB — COMPREHENSIVE METABOLIC PANEL
ALT: 18 U/L (ref 0–44)
AST: 24 U/L (ref 15–41)
Albumin: 3 g/dL — ABNORMAL LOW (ref 3.5–5.0)
Alkaline Phosphatase: 70 U/L (ref 38–126)
Anion gap: 9 (ref 5–15)
BUN: 38 mg/dL — ABNORMAL HIGH (ref 8–23)
CO2: 24 mmol/L (ref 22–32)
Calcium: 9.3 mg/dL (ref 8.9–10.3)
Chloride: 107 mmol/L (ref 98–111)
Creatinine, Ser: 1.34 mg/dL — ABNORMAL HIGH (ref 0.44–1.00)
GFR, Estimated: 39 mL/min — ABNORMAL LOW (ref >60)
Glucose, Bld: 152 mg/dL — ABNORMAL HIGH (ref 70–99)
Potassium: 4.6 mmol/L (ref 3.5–5.1)
Sodium: 140 mmol/L (ref 135–145)
Total Bilirubin: 0.4 mg/dL (ref 0.3–1.2)
Total Protein: 6.3 g/dL — ABNORMAL LOW (ref 6.5–8.1)

## 2022-06-29 LAB — CBC WITH DIFFERENTIAL/PLATELET
Abs Immature Granulocytes: 0.06 10*3/uL (ref 0.00–0.07)
Basophils Absolute: 0 10*3/uL (ref 0.0–0.1)
Basophils Relative: 1 %
Eosinophils Absolute: 0.1 10*3/uL (ref 0.0–0.5)
Eosinophils Relative: 1 %
HCT: 33 % — ABNORMAL LOW (ref 36.0–46.0)
Hemoglobin: 10.9 g/dL — ABNORMAL LOW (ref 12.0–15.0)
Immature Granulocytes: 1 %
Lymphocytes Relative: 11 %
Lymphs Abs: 0.9 10*3/uL (ref 0.7–4.0)
MCH: 31.1 pg (ref 26.0–34.0)
MCHC: 33 g/dL (ref 30.0–36.0)
MCV: 94 fL (ref 80.0–100.0)
Monocytes Absolute: 0.4 10*3/uL (ref 0.1–1.0)
Monocytes Relative: 5 %
Neutro Abs: 6.2 10*3/uL (ref 1.7–7.7)
Neutrophils Relative %: 81 %
Platelets: 174 10*3/uL (ref 150–400)
RBC: 3.51 MIL/uL — ABNORMAL LOW (ref 3.87–5.11)
RDW: 12.4 % (ref 11.5–15.5)
WBC: 7.6 10*3/uL (ref 4.0–10.5)
nRBC: 0 % (ref 0.0–0.2)

## 2022-06-29 LAB — TROPONIN I (HIGH SENSITIVITY): Troponin I (High Sensitivity): 11 ng/L (ref <18)

## 2022-06-29 MED ORDER — SODIUM CHLORIDE 0.9 % IV BOLUS: 1000.0000 mL | Freq: Once | INTRAVENOUS | Status: DC

## 2022-06-29 NOTE — ED Notes (Signed)
[facility] here to transport pt, pt vitals stable and in no acute distress at time of transport.

## 2022-06-29 NOTE — ED Provider Notes (Addendum)
  Physical Exam  BP (!) 156/92   Pulse 72   Temp 98 F (36.7 C) (Oral)   Resp 16   SpO2 100%   Physical Exam  Procedures  Procedures  ED Course / MDM    Medical Decision Making Amount and/or Complexity of Data Reviewed Labs: ordered. Radiology: ordered.   Assuming care of patient from Dr. Sallyanne Kuster.   Patient in the ED for near syncope. NO SYNCOPE today and had syncope workup recently. Back to baseline. Workup thus far shows - normal cbc,EKG.  Concerning findings are as following - none Important pending results are rest of the labs.  According to Dr. Sallyanne Kuster, plan is to d/c patient unless we have concerning findings.  Patient had no complains, no concerns from the nursing side. Will continue to monitor.   Derwood Kaplan, MD 06/29/22 1616   5:40 PM Labs and pacemaker interrogation is reassuring.  Stable for discharge.   Derwood Kaplan, MD 06/29/22 [phone]

## 2022-06-29 NOTE — ED Notes (Signed)
Got patient on the monitor did EKG patient is resting with call bell in reach  

## 2022-06-29 NOTE — ED Provider Notes (Signed)
[place] EMERGENCY DEPARTMENT AT [facility] Provider Note  CSN: [id number] Arrival date & time: 06/29/22 1351  Chief Complaint(s) Loss of Consciousness  HPI [name] is a 89 y.o. female with history of CKD, mild dementia, diabetic neuropathy, diabetes presenting to the emergency department with near syncope.  Patient reports that she was sitting at the kitchen table, felt lightheadedness, never lost consciousness.  Was helped to the floor by family members.  Per EMS family concerned that she has not been drinking as much.  Patient reports back to baseline now, no chest pain, fevers or chills, nausea or vomiting, headaches, leg swelling, back pain, syncope, other symptoms.   Past Medical History Past Medical History:  Diagnosis Date   Asthma    CKD (chronic kidney disease) stage 3, GFR 30-59 ml/min 05/01/2018   Colostomy present    hx/notes 07/17/2010   Dementia    Diabetic neuropathy    Hypertension    hx/notes 07/17/2010   Pneumonia    recent/notes 10/08/2016   Symptomatic bradycardia 12/15/2017   Type II diabetes mellitus    hx/notes 07/17/2010   Patient Active Problem List   Diagnosis Date Noted   History of anemia due to chronic kidney disease 05/09/2022   Hypoalbuminemia due to protein-calorie malnutrition 03/15/2022   Prediabetes 03/15/2022   Syncope 02/27/2022   Hypomagnesemia 02/26/2022   Altered sensorium 08/29/2021   Chronic diastolic CHF (congestive heart failure) 08/28/2021   Episode of altered consciousness 08/27/2021   Malnutrition of moderate degree 08/30/2020   Protein-calorie malnutrition, severe 08/30/2020   Acute kidney injury superimposed on chronic kidney disease 08/29/2020   Dehydration 08/29/2020   Hyperkalemia 08/29/2020   Macrocytic anemia 08/29/2020   COVID-19 virus infection 04/07/2020   Pacemaker 12/21/2019   UTI (urinary tract infection) 05/11/2019   ARF (acute renal failure) 05/10/2019   Acute lower UTI 05/10/2019   Symptomatic  bradycardia 05/01/2018   Near syncope 05/01/2018   Stage 3b chronic kidney disease (CKD) 05/01/2018   Influenza B 04/10/2018   Generalized weakness 04/10/2018   Essential hypertension 03/01/2018   Asthma with chronic obstructive pulmonary disease (COPD) 03/01/2018   Bradycardia 12/15/2017   AKI (acute kidney injury) 07/18/2017   Colostomy present 11/08/2016   Syncope and collapse 10/08/2016   Abnormal chest x-ray 10/08/2016   Lethargy 10/08/2016   Dementia    Sepsis 09/05/2016   Community acquired pneumonia 09/05/2016   Acute encephalopathy 09/05/2016   DM2 (diabetes mellitus, type 2) 04/09/2012   Nausea & vomiting 04/09/2012   Gallstones 03/15/2011   Partial SBO 03/15/2011   Colostomy hernia 03/15/2011   Hypercalcemia 02/28/2007   IRON DEFIC ANEMIA SEC DIET IRON INTAKE 02/28/2007   DISCITIS 02/28/2007   OSTEOMYELITIS 02/28/2007   CHILLS WITHOUT FEVER 02/28/2007   Home Medication(s) Prior to Admission medications   Medication Sig Start Date End Date Taking? Authorizing Provider  acetaminophen (TYLENOL) 500 MG tablet Take 500 mg by mouth every 6 (six) hours as needed for moderate pain.    [provider]  ALBUTEROL SULFATE PO Take 2 puffs by mouth every 4 (four) hours as needed (Wheezing/SOB).    [provider]  busPIRone (BUSPAR) 5 MG tablet Take 1 tablet (5 mg total) by mouth 3 (three) times daily. 04/29/20   Medina-Vargas, Monina C, NP  donepezil (ARICEPT) 10 MG tablet Take 1 tablet (10 mg total) by mouth at bedtime. 09/05/20   Rhetta Mura, MD  memantine (NAMENDA XR) 28 MG CP24 24 hr capsule Take 1 capsule (  28 mg total) by mouth every morning. 09/05/20   Rhetta Mura, MD                                                                                                                                    Past Surgical History Past Surgical History:  Procedure Laterality Date   ABDOMINAL HYSTERECTOMY     COLOSTOMY     S/P SBO hx/notes 07/17/2010    LUMBAR FUSION  11/2006   hx/notes 07/17/2010   NASAL SINUS SURGERY  04/28/2002   Bilateral inferior turbinate reductions, bilateral maxillary antrotomies, bilateral total ethmoidectomies, bilateral frontal recess explorations, bilateral sphenoidotomies, Instatrac guidance./notes 07/28/2010   PACEMAKER IMPLANT N/A 05/02/2018   Procedure: PACEMAKER IMPLANT;  Surgeon: Marinus Maw, MD;  Location: [facility] INVASIVE CV LAB;  Service: Cardiovascular;  Laterality: N/A;   Family History Family History  Problem Relation Age of Onset   Kidney disease Mother    Hypertension Mother    Other Father        gangrene with amputation   Diabetes Sister    Cancer Brother    Heart disease Brother    Schizophrenia Daughter    Diabetes Daughter    Stroke Daughter    Diabetes Sister    Breast cancer Neg Hx     Social History Social History   Tobacco Use   Smoking status: Never   Smokeless tobacco: Former    Types: Chew    Quit date: 02/27/2017  Vaping Use   Vaping Use: Never used  Substance Use Topics   Alcohol use: No   Drug use: No   Allergies Aspirin  Review of Systems Review of Systems  All other systems reviewed and are negative.   Physical Exam Vital Signs  I have reviewed the triage vital signs BP (!) 156/92   Pulse 72   Temp 98 F (36.7 C) (Oral)   Resp 16   SpO2 100%  Physical Exam Vitals and nursing note reviewed.  Constitutional:      General: She is not in acute distress.    Appearance: She is well-developed.     Comments: Frail appearing  HENT:     Head: Normocephalic and atraumatic.     Mouth/Throat:     Mouth: Mucous membranes are moist.  Eyes:     Pupils: Pupils are equal, round, and reactive to light.  Cardiovascular:     Rate and Rhythm: Normal rate and regular rhythm.     Heart sounds: No murmur heard. Pulmonary:     Effort: Pulmonary effort is normal. No respiratory distress.     Breath sounds: Normal breath sounds.  Abdominal:     General: Abdomen is  flat.     Palpations: Abdomen is soft.     Tenderness: There is no abdominal tenderness.  Musculoskeletal:        General: No tenderness.     Right lower leg: No edema.  Left lower leg: No edema.  Skin:    General: Skin is warm and dry.  Neurological:     General: No focal deficit present.     Mental Status: She is alert. Mental status is at baseline.  Psychiatric:        Mood and Affect: Mood normal.        Behavior: Behavior normal.     ED Results and Treatments Labs (all labs ordered are listed, but only abnormal results are displayed) Labs Reviewed  COMPREHENSIVE METABOLIC PANEL - Abnormal; Notable for the following components:      Result Value   Glucose, Bld 152 (*)    BUN 38 (*)    Creatinine, Ser 1.34 (*)    Total Protein 6.3 (*)    Albumin 3.0 (*)    GFR, Estimated 39 (*)    All other components within normal limits  CBC WITH DIFFERENTIAL/PLATELET - Abnormal; Notable for the following components:   RBC 3.51 (*)    Hemoglobin 10.9 (*)    HCT 33.0 (*)    All other components within normal limits  TROPONIN I (HIGH SENSITIVITY)  TROPONIN I (HIGH SENSITIVITY)                                                                                                                          Radiology DG Chest Port 1 View  Result Date: 06/29/2022 CLINICAL DATA:  Cough EXAM: PORTABLE CHEST 1 VIEW COMPARISON:  CXR 06/23/22 FINDINGS: Left-sided dual lead cardiac device in place with unchanged lead positioning. No pleural effusion. No pneumothorax. No focal airspace opacity. Normal cardiac and mediastinal contours. No radiographically apparent displaced rib fractures. Visualized upper abdomen is unremarkable IMPRESSION: No focal airspace opacity. Electronically Signed   By: Lorenza Cambridge M.D.   On: 06/29/2022 15:05    Pertinent labs & imaging results that were available during my care of the patient were reviewed by me and considered in my medical decision making (see MDM for  details).  Medications Ordered in ED Medications  sodium chloride 0.9 % bolus 1,000 mL (has no administration in time range)                                                                                                                                     Procedures Procedures  (including critical care time)  Medical Decision Making / ED Course  MDM:  89 year old female presenting to the emergency department after near syncope.  Patient well-appearing.  She denies any current symptoms.  She has recently been admitted for syncope and had workup and was ultimately diagnosed with orthostatic syncope.  She had no syncope today.  Family has been concerned about dehydration.  Will give small fluid bolus.  CBC and CMP around baseline.  Will check troponin although patient denies any chest pain.  Will interrogate pacemaker.  Given that patient feels back to baseline and has had recent admission for syncope without dangerous cause found, if she is feeling better and labs including troponin and pacemaker interrogation are reassuring can likely go home.  Signed out pending pacemaker interrogation and troponin, re-assessment.       Additional history obtained: -Additional history obtained from ems -External records from outside source obtained and reviewed including: Chart review including previous notes, labs, imaging, consultation notes including previous records including ER visits for weakness and near syncope   Lab Tests: -I ordered, reviewed, and interpreted labs.   The pertinent results include:   Labs Reviewed  COMPREHENSIVE METABOLIC PANEL - Abnormal; Notable for the following components:      Result Value   Glucose, Bld 152 (*)    BUN 38 (*)    Creatinine, Ser 1.34 (*)    Total Protein 6.3 (*)    Albumin 3.0 (*)    GFR, Estimated 39 (*)    All other components within normal limits  CBC WITH DIFFERENTIAL/PLATELET - Abnormal; Notable for the following components:   RBC 3.51  (*)    Hemoglobin 10.9 (*)    HCT 33.0 (*)    All other components within normal limits  TROPONIN I (HIGH SENSITIVITY)  TROPONIN I (HIGH SENSITIVITY)    Notable for labs at baseline  EKG   EKG Interpretation  Date/Time:  Monday June 29 2022 14:01:34 EDT Ventricular Rate:  136 PR Interval:  140 QRS Duration: 95 QT Interval:  339 QTC Calculation: 438 R Axis:   -42 Text Interpretation: Normal sinus rhythm Anteroseptal infarct, old Nonspecific T abnormalities, inferior leads Artifact in lead(s) II III aVR aVL aVF V1 V2 V3 V4 V5 V6 Reconfirmed by Alvino Blood ([id number]) on 06/29/2022 4:32:08 PM         Imaging Studies ordered: I ordered imaging studies including CXR On my interpretation imaging demonstrates no acute process I independently visualized and interpreted imaging. I agree with the radiologist interpretation   Medicines ordered and prescription drug management: Meds ordered this encounter  Medications   sodium chloride 0.9 % bolus 1,000 mL    -I have reviewed the patients home medicines and have made adjustments as needed   Cardiac Monitoring: The patient was maintained on a cardiac monitor.  I personally viewed and interpreted the cardiac monitored which showed an underlying rhythm of: NSR  Reevaluation: After the interventions noted above, I reevaluated the patient and found that their symptoms have improved  Co morbidities that complicate the patient evaluation  Past Medical History:  Diagnosis Date   Asthma    CKD (chronic kidney disease) stage 3, GFR 30-59 ml/min 05/01/2018   Colostomy present    hx/notes 07/17/2010   Dementia    Diabetic neuropathy    Hypertension    hx/notes 07/17/2010   Pneumonia    recent/notes 10/08/2016   Symptomatic bradycardia 12/15/2017   Type II diabetes mellitus    hx/notes 07/17/2010      Dispostion: Disposition decision including need for hospitalization was considered,  and patient disposition pending at time of sign  out.    Final Clinical Impression(s) / ED Diagnoses Final diagnoses:  Near syncope     This chart was dictated using voice recognition software.  Despite best efforts to proofread,  errors can occur which can change the documentation meaning.    Lonell Grandchild, MD 06/29/22 [phone]

## 2022-06-29 NOTE — Discharge Instructions (Signed)
The workup in the emergency room, including pacemaker interrogation, labs are reassuring. Follow-up with your primary care doctor as needed.  Return to the ER if you have repeat episode.  Make sure you hydrate well.

## 2022-06-29 NOTE — ED Triage Notes (Signed)
Pt BIB [facility] from home d/y syncopal episode. Pt was eating w her family, and suddenly had a syncopal episode. Her family assisted her to the floor ,therefore did not hit her head or obtain any injuries. Family reported pt has not been drinking a lot of water recently.y only juice.   BP125/75 RR 16  CBG 109

## 2022-06-30 LAB — CUP PACEART REMOTE DEVICE CHECK
Battery Remaining Longevity: 70 mo
Battery Remaining Percentage: 63 %
Battery Voltage: 3.01 V
Brady Statistic AP VP Percent: 1 %
Brady Statistic AP VS Percent: 73 %
Brady Statistic AS VP Percent: 1 %
Brady Statistic AS VS Percent: 27 %
Brady Statistic RA Percent Paced: 72 %
Brady Statistic RV Percent Paced: 1 %
Date Time Interrogation Session: 20240415165229
Implantable Lead Connection Status: 753985
Implantable Lead Connection Status: 753985
Implantable Lead Implant Date: 20200217
Implantable Lead Implant Date: 20200217
Implantable Lead Location: 753859
Implantable Lead Location: 753860
Implantable Pulse Generator Implant Date: 20200217
Lead Channel Impedance Value: 400 Ohm
Lead Channel Impedance Value: 410 Ohm
Lead Channel Pacing Threshold Amplitude: 0.5 V
Lead Channel Pacing Threshold Amplitude: 0.5 V
Lead Channel Pacing Threshold Pulse Width: 0.5 ms
Lead Channel Pacing Threshold Pulse Width: 0.5 ms
Lead Channel Sensing Intrinsic Amplitude: 1.1 mV
Lead Channel Sensing Intrinsic Amplitude: 12 mV
Lead Channel Setting Pacing Amplitude: 2 V
Lead Channel Setting Pacing Amplitude: 2.5 V
Lead Channel Setting Pacing Pulse Width: 0.5 ms
Lead Channel Setting Sensing Sensitivity: 2 mV
Pulse Gen Model: 2272
Pulse Gen Serial Number: 9107620

## 2022-07-06 ENCOUNTER — Other Ambulatory Visit: Payer: Self-pay

## 2022-07-06 ENCOUNTER — Emergency Department (HOSPITAL_COMMUNITY): Payer: 59

## 2022-07-06 ENCOUNTER — Inpatient Hospital Stay (HOSPITAL_COMMUNITY)
Admission: EM | Admit: 2022-07-06 | Discharge: 2022-07-09 | DRG: 189 | Disposition: A | Discharge status: Hospice | Payer: 59 | Attending: Family Medicine | Admitting: Family Medicine

## 2022-07-06 DIAGNOSIS — Z7189 Other specified counseling: Secondary | ICD-10-CM | POA: Diagnosis not present

## 2022-07-06 DIAGNOSIS — I5032 Chronic diastolic (congestive) heart failure: Secondary | ICD-10-CM | POA: Diagnosis not present

## 2022-07-06 DIAGNOSIS — N184 Chronic kidney disease, stage 4 (severe): Secondary | ICD-10-CM | POA: Diagnosis not present

## 2022-07-06 DIAGNOSIS — F0390 Unspecified dementia without behavioral disturbance: Secondary | ICD-10-CM | POA: Diagnosis not present

## 2022-07-06 DIAGNOSIS — I951 Orthostatic hypotension: Secondary | ICD-10-CM | POA: Diagnosis not present

## 2022-07-06 DIAGNOSIS — J9622 Acute and chronic respiratory failure with hypercapnia: Principal | ICD-10-CM | POA: Diagnosis present

## 2022-07-06 DIAGNOSIS — Z886 Allergy status to analgesic agent status: Secondary | ICD-10-CM | POA: Diagnosis not present

## 2022-07-06 DIAGNOSIS — G309 Alzheimer's disease, unspecified: Secondary | ICD-10-CM | POA: Diagnosis present

## 2022-07-06 DIAGNOSIS — J45901 Unspecified asthma with (acute) exacerbation: Secondary | ICD-10-CM | POA: Diagnosis not present

## 2022-07-06 DIAGNOSIS — E1122 Type 2 diabetes mellitus with diabetic chronic kidney disease: Secondary | ICD-10-CM | POA: Diagnosis not present

## 2022-07-06 DIAGNOSIS — E869 Volume depletion, unspecified: Secondary | ICD-10-CM | POA: Diagnosis not present

## 2022-07-06 DIAGNOSIS — R410 Disorientation, unspecified: Secondary | ICD-10-CM

## 2022-07-06 DIAGNOSIS — J441 Chronic obstructive pulmonary disease with (acute) exacerbation: Secondary | ICD-10-CM | POA: Diagnosis not present

## 2022-07-06 DIAGNOSIS — R627 Adult failure to thrive: Secondary | ICD-10-CM | POA: Diagnosis not present

## 2022-07-06 DIAGNOSIS — I130 Hypertensive heart and chronic kidney disease with heart failure and stage 1 through stage 4 chronic kidney disease, or unspecified chronic kidney disease: Secondary | ICD-10-CM | POA: Diagnosis not present

## 2022-07-06 DIAGNOSIS — E875 Hyperkalemia: Secondary | ICD-10-CM | POA: Diagnosis present

## 2022-07-06 DIAGNOSIS — Z66 Do not resuscitate: Secondary | ICD-10-CM | POA: Diagnosis not present

## 2022-07-06 DIAGNOSIS — R234 Changes in skin texture: Secondary | ICD-10-CM | POA: Diagnosis not present

## 2022-07-06 DIAGNOSIS — Z841 Family history of disorders of kidney and ureter: Secondary | ICD-10-CM

## 2022-07-06 DIAGNOSIS — Z933 Colostomy status: Secondary | ICD-10-CM

## 2022-07-06 DIAGNOSIS — Z79899 Other long term (current) drug therapy: Secondary | ICD-10-CM | POA: Diagnosis not present

## 2022-07-06 DIAGNOSIS — Z515 Encounter for palliative care: Secondary | ICD-10-CM | POA: Diagnosis not present

## 2022-07-06 DIAGNOSIS — E1140 Type 2 diabetes mellitus with diabetic neuropathy, unspecified: Secondary | ICD-10-CM | POA: Diagnosis not present

## 2022-07-06 DIAGNOSIS — R6889 Other general symptoms and signs: Secondary | ICD-10-CM | POA: Diagnosis not present

## 2022-07-06 DIAGNOSIS — N179 Acute kidney failure, unspecified: Secondary | ICD-10-CM | POA: Diagnosis not present

## 2022-07-06 DIAGNOSIS — Z981 Arthrodesis status: Secondary | ICD-10-CM

## 2022-07-06 DIAGNOSIS — E46 Unspecified protein-calorie malnutrition: Secondary | ICD-10-CM | POA: Diagnosis present

## 2022-07-06 DIAGNOSIS — Z833 Family history of diabetes mellitus: Secondary | ICD-10-CM

## 2022-07-06 DIAGNOSIS — J9621 Acute and chronic respiratory failure with hypoxia: Secondary | ICD-10-CM | POA: Diagnosis not present

## 2022-07-06 DIAGNOSIS — F02A3 Dementia in other diseases classified elsewhere, mild, with mood disturbance: Secondary | ICD-10-CM | POA: Diagnosis present

## 2022-07-06 DIAGNOSIS — Z681 Body mass index (BMI) 19 or less, adult: Secondary | ICD-10-CM

## 2022-07-06 DIAGNOSIS — F32A Depression, unspecified: Secondary | ICD-10-CM | POA: Diagnosis present

## 2022-07-06 DIAGNOSIS — J9601 Acute respiratory failure with hypoxia: Secondary | ICD-10-CM

## 2022-07-06 DIAGNOSIS — J9690 Respiratory failure, unspecified, unspecified whether with hypoxia or hypercapnia: Secondary | ICD-10-CM | POA: Diagnosis present

## 2022-07-06 DIAGNOSIS — Z818 Family history of other mental and behavioral disorders: Secondary | ICD-10-CM

## 2022-07-06 DIAGNOSIS — F329 Major depressive disorder, single episode, unspecified: Secondary | ICD-10-CM

## 2022-07-06 DIAGNOSIS — Z950 Presence of cardiac pacemaker: Secondary | ICD-10-CM

## 2022-07-06 DIAGNOSIS — R001 Bradycardia, unspecified: Secondary | ICD-10-CM | POA: Diagnosis present

## 2022-07-06 DIAGNOSIS — J9602 Acute respiratory failure with hypercapnia: Secondary | ICD-10-CM | POA: Diagnosis not present

## 2022-07-06 DIAGNOSIS — Z87891 Personal history of nicotine dependence: Secondary | ICD-10-CM

## 2022-07-06 DIAGNOSIS — R680 Hypothermia, not associated with low environmental temperature: Secondary | ICD-10-CM | POA: Diagnosis present

## 2022-07-06 DIAGNOSIS — Z823 Family history of stroke: Secondary | ICD-10-CM

## 2022-07-06 DIAGNOSIS — R32 Unspecified urinary incontinence: Secondary | ICD-10-CM | POA: Diagnosis present

## 2022-07-06 DIAGNOSIS — R531 Weakness: Secondary | ICD-10-CM | POA: Diagnosis not present

## 2022-07-06 DIAGNOSIS — Z8249 Family history of ischemic heart disease and other diseases of the circulatory system: Secondary | ICD-10-CM

## 2022-07-06 DIAGNOSIS — Z90710 Acquired absence of both cervix and uterus: Secondary | ICD-10-CM

## 2022-07-06 DIAGNOSIS — R400 Somnolence: Secondary | ICD-10-CM | POA: Diagnosis not present

## 2022-07-06 DIAGNOSIS — Z743 Need for continuous supervision: Secondary | ICD-10-CM | POA: Diagnosis not present

## 2022-07-06 LAB — COMPREHENSIVE METABOLIC PANEL
ALT: 17 U/L (ref 0–44)
AST: 21 U/L (ref 15–41)
Albumin: 3.2 g/dL — ABNORMAL LOW (ref 3.5–5.0)
Alkaline Phosphatase: 74 U/L (ref 38–126)
Anion gap: 8 (ref 5–15)
BUN: 50 mg/dL — ABNORMAL HIGH (ref 8–23)
CO2: 26 mmol/L (ref 22–32)
Calcium: 9.2 mg/dL (ref 8.9–10.3)
Chloride: 107 mmol/L (ref 98–111)
Creatinine, Ser: 1.5 mg/dL — ABNORMAL HIGH (ref 0.44–1.00)
GFR, Estimated: 34 mL/min — ABNORMAL LOW (ref >60)
Glucose, Bld: 110 mg/dL — ABNORMAL HIGH (ref 70–99)
Potassium: 4.5 mmol/L (ref 3.5–5.1)
Sodium: 141 mmol/L (ref 135–145)
Total Bilirubin: 0.8 mg/dL (ref 0.3–1.2)
Total Protein: 6.8 g/dL (ref 6.5–8.1)

## 2022-07-06 LAB — CBC WITH DIFFERENTIAL/PLATELET
Abs Immature Granulocytes: 0.01 10*3/uL (ref 0.00–0.07)
Basophils Absolute: 0 10*3/uL (ref 0.0–0.1)
Basophils Relative: 1 %
Eosinophils Absolute: 0.2 10*3/uL (ref 0.0–0.5)
Eosinophils Relative: 5 %
HCT: 32.1 % — ABNORMAL LOW (ref 36.0–46.0)
Hemoglobin: 10.2 g/dL — ABNORMAL LOW (ref 12.0–15.0)
Immature Granulocytes: 0 %
Lymphocytes Relative: 38 %
Lymphs Abs: 1.7 10*3/uL (ref 0.7–4.0)
MCH: 30.7 pg (ref 26.0–34.0)
MCHC: 31.8 g/dL (ref 30.0–36.0)
MCV: 96.7 fL (ref 80.0–100.0)
Monocytes Absolute: 0.3 10*3/uL (ref 0.1–1.0)
Monocytes Relative: 7 %
Neutro Abs: 2.2 10*3/uL (ref 1.7–7.7)
Neutrophils Relative %: 49 %
Platelets: 194 10*3/uL (ref 150–400)
RBC: 3.32 MIL/uL — ABNORMAL LOW (ref 3.87–5.11)
RDW: 12.4 % (ref 11.5–15.5)
WBC: 4.4 10*3/uL (ref 4.0–10.5)
nRBC: 0 % (ref 0.0–0.2)

## 2022-07-06 LAB — AMMONIA: Ammonia: 10 umol/L (ref 9–35)

## 2022-07-06 LAB — RAPID URINE DRUG SCREEN, HOSP PERFORMED
Amphetamines: NOT DETECTED
Barbiturates: NOT DETECTED
Benzodiazepines: NOT DETECTED
Cocaine: NOT DETECTED
Opiates: NOT DETECTED
Tetrahydrocannabinol: NOT DETECTED

## 2022-07-06 LAB — URINALYSIS, W/ REFLEX TO CULTURE (INFECTION SUSPECTED)
Bacteria, UA: NONE SEEN
Bilirubin Urine: NEGATIVE
Glucose, UA: NEGATIVE mg/dL
Ketones, ur: NEGATIVE mg/dL
Leukocytes,Ua: NEGATIVE
Nitrite: NEGATIVE
Protein, ur: NEGATIVE mg/dL
Specific Gravity, Urine: 1.01 (ref 1.005–1.030)
pH: 5 (ref 5.0–8.0)

## 2022-07-06 LAB — I-STAT VENOUS BLOOD GAS, ED
Acid-base deficit: 1 mmol/L (ref 0.0–2.0)
Bicarbonate: 27 mmol/L (ref 20.0–28.0)
Calcium, Ion: 1.33 mmol/L (ref 1.15–1.40)
HCT: 33 % — ABNORMAL LOW (ref 36.0–46.0)
Hemoglobin: 11.2 g/dL — ABNORMAL LOW (ref 12.0–15.0)
O2 Saturation: 74 %
Potassium: 4.6 mmol/L (ref 3.5–5.1)
Sodium: 144 mmol/L (ref 135–145)
TCO2: 29 mmol/L (ref 22–32)
pCO2, Ven: 61.8 mmHg — ABNORMAL HIGH (ref 44–60)
pH, Ven: 7.248 — ABNORMAL LOW (ref 7.25–7.43)
pO2, Ven: 47 mmHg — ABNORMAL HIGH (ref 32–45)

## 2022-07-06 LAB — TROPONIN I (HIGH SENSITIVITY)
Troponin I (High Sensitivity): 7 ng/L (ref <18)
Troponin I (High Sensitivity): 7 ng/L (ref <18)

## 2022-07-06 LAB — CK: Total CK: 46 U/L (ref 38–234)

## 2022-07-06 LAB — MAGNESIUM: Magnesium: 2.1 mg/dL (ref 1.7–2.4)

## 2022-07-06 MED ORDER — METHYLPREDNISOLONE SODIUM SUCC 40 MG IJ SOLR
40.0000 mg | Freq: Once | INTRAMUSCULAR | Status: AC
  Administered 2022-07-06: 40 mg via INTRAVENOUS
  Filled 2022-07-06: qty 1

## 2022-07-06 MED ORDER — MEMANTINE HCL ER 28 MG PO CP24
28.0000 mg | ORAL_CAPSULE | Freq: Every morning | ORAL | Status: DC
  Administered 2022-07-07 – 2022-07-09 (×3): 28 mg via ORAL
  Filled 2022-07-06 (×3): qty 1

## 2022-07-06 MED ORDER — HEPARIN SODIUM (PORCINE) 5000 UNIT/ML IJ SOLN
5000.0000 [IU] | Freq: Three times a day (TID) | INTRAMUSCULAR | Status: DC
  Administered 2022-07-07 – 2022-07-09 (×9): 5000 [IU] via SUBCUTANEOUS
  Filled 2022-07-06 (×9): qty 1

## 2022-07-06 MED ORDER — DONEPEZIL HCL 10 MG PO TABS
10.0000 mg | ORAL_TABLET | Freq: Every day | ORAL | Status: DC
  Administered 2022-07-07 – 2022-07-08 (×3): 10 mg via ORAL
  Filled 2022-07-06 (×3): qty 1

## 2022-07-06 MED ORDER — ONDANSETRON HCL 4 MG PO TABS: 4.0000 mg | ORAL_TABLET | Freq: Four times a day (QID) | ORAL | Status: DC | PRN

## 2022-07-06 MED ORDER — IPRATROPIUM-ALBUTEROL 0.5-2.5 (3) MG/3ML IN SOLN
3.0000 mL | Freq: Once | RESPIRATORY_TRACT | Status: AC
  Administered 2022-07-06: 3 mL via RESPIRATORY_TRACT
  Filled 2022-07-06: qty 3

## 2022-07-06 MED ORDER — ONDANSETRON HCL 4 MG/2ML IJ SOLN: 4.0000 mg | Freq: Four times a day (QID) | INTRAMUSCULAR | Status: DC | PRN

## 2022-07-06 MED ORDER — BUSPIRONE HCL 5 MG PO TABS
5.0000 mg | ORAL_TABLET | Freq: Three times a day (TID) | ORAL | Status: DC
  Administered 2022-07-07 – 2022-07-09 (×8): 5 mg via ORAL
  Filled 2022-07-06 (×9): qty 1

## 2022-07-06 NOTE — ED Triage Notes (Signed)
Patient BIB EMS home c/o weakness, confusion, and poor intake. Poss. UTI Found in bed with urine and feces. Poor family support. VSS. Pacemaker. CBG 109. H/O Dementia

## 2022-07-06 NOTE — ED Provider Notes (Signed)
[place] [facility]MENT AT [facility] Provider Note   CSN: [id number] Arrival date & time: 07/06/22  1830     History  Chief Complaint  Patient presents with   Weakness   Poor intake    Altered Mental Status    [name] is a 89 y.o. female.   Weakness Altered Mental Status Associated symptoms: weakness      89 year old female with medical history significant for CKD, symptomatic bradycardia status post PPM, HTN, DM 2, COPD, recurrent UTIs, CHF, hypomagnesemia, hypoalbuminemia due to protein calorie malnutrition who presents to the emergency department with weakness, decreased mental status.  Patient has a history of dementia and her HPI was limited due to this.  She is she was reportedly found on the floor covered in urine and feces.  Poor family PERT support per EMS.  She arrives to the emergency department cachectic, GCS 14, AAO x 2, unclear baseline, remainder of HPI limited by the patient's mental status however she is without complaint.  Home Medications Prior to Admission medications   Medication Sig Start Date End Date Taking? Authorizing Provider  acetaminophen (TYLENOL) 500 MG tablet Take 500 mg by mouth every 6 (six) hours as needed for moderate pain.    [provider]  ALBUTEROL SULFATE PO Take 2 puffs by mouth every 4 (four) hours as needed (Wheezing/SOB).    [provider]  busPIRone (BUSPAR) 5 MG tablet Take 1 tablet (5 mg total) by mouth 3 (three) times daily. 04/29/20   Medina-Vargas, Monina C, NP  donepezil (ARICEPT) 10 MG tablet Take 1 tablet (10 mg total) by mouth at bedtime. 09/05/20   Rhetta Mura, MD  memantine (NAMENDA XR) 28 MG CP24 24 hr capsule Take 1 capsule (28 mg total) by mouth every morning. 09/05/20   Rhetta Mura, MD      Allergies    Aspirin    Review of Systems   Review of Systems  Unable to perform ROS: Dementia  Neurological:  Positive for weakness.    Physical Exam Updated  Vital Signs BP (!) 161/85   Pulse 60   Temp (!) 95.9 F (35.5 C) (Rectal) Comment: MD made aware  Resp (!) 24   SpO2 100%  Physical Exam Vitals and nursing note reviewed.  Constitutional:      General: She is not in acute distress.    Appearance: She is well-developed.  HENT:     Head: Normocephalic and atraumatic.  Eyes:     Conjunctiva/sclera: Conjunctivae normal.  Cardiovascular:     Rate and Rhythm: Normal rate and regular rhythm.  Pulmonary:     Effort: Pulmonary effort is normal. No respiratory distress.     Breath sounds: Wheezing present.  Abdominal:     Palpations: Abdomen is soft.     Tenderness: There is no abdominal tenderness.  Musculoskeletal:        General: No swelling.     Cervical back: Neck supple.  Skin:    General: Skin is warm and dry.     Capillary Refill: Capillary refill takes less than 2 seconds.  Neurological:     Mental Status: She is alert.  Psychiatric:        Mood and Affect: Mood normal.     ED Results / Procedures / Treatments   Labs (all labs ordered are listed, but only abnormal results are displayed) Labs Reviewed  COMPREHENSIVE METABOLIC PANEL - Abnormal; Notable for the following components:      Result  Value   Glucose, Bld 110 (*)    BUN 50 (*)    Creatinine, Ser 1.50 (*)    Albumin 3.2 (*)    GFR, Estimated 34 (*)    All other components within normal limits  CBC WITH DIFFERENTIAL/PLATELET - Abnormal; Notable for the following components:   RBC 3.32 (*)    Hemoglobin 10.2 (*)    HCT 32.1 (*)    All other components within normal limits  URINALYSIS, W/ REFLEX TO CULTURE (INFECTION SUSPECTED) - Abnormal; Notable for the following components:   Color, Urine STRAW (*)    Hgb urine dipstick SMALL (*)    All other components within normal limits  I-STAT VENOUS BLOOD GAS, ED - Abnormal; Notable for the following components:   pH, Ven 7.248 (*)    pCO2, Ven 61.8 (*)    pO2, Ven 47 (*)    HCT 33.0 (*)    Hemoglobin 11.2 (*)     All other components within normal limits  AMMONIA  RAPID URINE DRUG SCREEN, HOSP PERFORMED  CK  MAGNESIUM  CBG MONITORING, ED  TROPONIN I (HIGH SENSITIVITY)  TROPONIN I (HIGH SENSITIVITY)    EKG EKG Interpretation  Date/Time:  Monday July 06 2022 20:13:57 EDT Ventricular Rate:  72 PR Interval:    QRS Duration: 120 QT Interval:  327 QTC Calculation: 358 R Axis:   -15 Text Interpretation: Normal sinus rhythm Artifact in lead(s) III aVL aVF V2 V3 V4 V5 V6 Confirmed by Ernie Avena ([id number]) on 07/06/2022 8:48:28 PM  Radiology CT HEAD WO CONTRAST  Result Date: 07/06/2022 CLINICAL DATA:  Mental status change EXAM: CT HEAD WITHOUT CONTRAST TECHNIQUE: Contiguous axial images were obtained from the base of the skull through the vertex without intravenous contrast. RADIATION DOSE REDUCTION: This exam was performed according to the departmental dose-optimization program which includes automated exposure control, adjustment of the mA and/or kV according to patient size and/or use of iterative reconstruction technique. COMPARISON:  05/09/2022 CT head FINDINGS: Brain: No evidence of acute infarction, hemorrhage, mass, mass effect, or midline shift. No hydrocephalus or extra-axial fluid collection. Remote right cerebellar infarct. Unchanged empty sella. Periventricular white matter changes, likely the sequela of chronic small vessel ischemic disease. Vascular: No hyperdense vessel. Skull: Negative for fracture or focal lesion. Sinuses/Orbits: Postsurgical changes in the paranasal sinuses, with mild mucosal thickening throughout. No acute finding in the orbits. Other: The mastoid air cells are well aerated. IMPRESSION: No acute intracranial process. Electronically Signed   By: Wiliam Ke M.D.   On: 07/06/2022 19:24   DG Chest Portable 1 View  Result Date: 07/06/2022 CLINICAL DATA:  Weakness and altered mental status. EXAM: PORTABLE CHEST 1 VIEW COMPARISON:  Most recent radiograph 06/29/2022. CT  03/12/2022 FINDINGS: Left-sided pacemaker remains in place. Stable heart size and mediastinal contours. Chronic hyperinflation. Multiple skin folds project over the right hemithorax. No focal airspace disease, pneumothorax, pleural effusion or pulmonary edema. The bones are diffusely under mineralized. On limited assessment, no acute osseous finding. IMPRESSION: No acute abnormality or change from prior exams. Electronically Signed   By: Narda Rutherford M.D.   On: 07/06/2022 19:02    Procedures Ultrasound ED Peripheral IV (Provider)  Date/Time: 07/06/2022 7:46 PM  Performed by: Ernie Avena, MD Authorized by: Ernie Avena, MD   Procedure details:    Indications: multiple failed IV attempts     Skin Prep: chlorhexidine gluconate     Location:  Right AC   Angiocath:  20 G   Bedside  Ultrasound Guided: Yes     Images: not archived     Patient tolerated procedure without complications: Yes     Dressing applied: Yes       Medications Ordered in ED Medications  methylPREDNISolone sodium succinate (SOLU-MEDROL) 40 mg/mL injection 40 mg (has no administration in time range)  ipratropium-albuterol (DUONEB) 0.5-2.5 (3) MG/3ML nebulizer solution 3 mL (3 mLs Nebulization Given 07/06/22 2218)    ED Course/ Medical Decision Making/ A&P Clinical Course as of 07/06/22 2302  Mon Jul 06, 2022  2010 Temp(!): 95.9 F (35.5 C) [JL]  2159 pH, Ven(!): 7.248 [JL]  2159 pCO2, Ven(!): 61.8 [JL]    Clinical Course User Index [JL] Ernie Avena, MD                             Medical Decision Making Amount and/or Complexity of Data Reviewed Labs: ordered. Decision-making details documented in ED Course. Radiology: ordered.  Risk Prescription drug management. Decision regarding hospitalization.     89 year old female with medical history significant for CKD, symptomatic bradycardia status post PPM, HTN, DM 2, COPD, recurrent UTIs, CHF, hypomagnesemia, hypoalbuminemia due to protein calorie  malnutrition who presents to the emergency department with weakness, decreased mental status.  Patient has a history of dementia and her HPI was limited due to this.  She is she was reportedly found on the floor covered in urine and feces.  Poor family PERT support per EMS.  She arrives to the emergency department cachectic, GCS 14, AAO x 2, unclear baseline, remainder of HPI limited by the patient's mental status however she is without complaint.  Arrival, the patient was mildly hypothermic, temperature 95.9, heart rate borderline bradycardic, initially 59, subsequently 60, BP 161/85, saturating 100% on room air.  Physical exam significant for mild expiratory wheezing present, otherwise unremarkable.  Differential diagnosis is broad and includes CVA, electrolyte abnormality, toxic metabolic derangement from infectious etiology or other toxic metabolic derangement, worsening dementia.  An EKG was performed revealed sinus rhythm, artifact present due to mild resting tremor, no acute ischemic changes noted, chest x-ray was performed which revealed no focal abnormality.  Laboratory evaluation performed revealed no leukocytosis on CBC, mild anemia to 10.2, magnesium normal, manic, gonia normal, initial troponin 7, repeat pending, CK normal, CMP with baseline CKD at 1.50, no other acute abnormality with mild elevated BUN to 50, VBG revealed a respiratory acidosis with a pH of 7.25, pCO2 of 62 with a normal bicarbonate.  The patient was subsequently placed on BiPAP.  She was administered Solu-Medrol and a DuoNeb for concern for COPD exacerbation and hypercarbic respiratory failure.  A CT head was performed revealed no acute abnormality the patient is moving all her extremities with no concern for acute CVA.  In the setting of the patient's known COPD, concern for acute exacerbation, hospitalist medicine was consulted for admission, Dr. Haroldine Laws accepting.   Final Clinical Impression(s) / ED Diagnoses Final  diagnoses:  COPD exacerbation  Confusion  Acute on chronic respiratory failure with hypercapnia    Rx / DC Orders ED Discharge Orders     None         Ernie Avena, MD 07/06/22 2302

## 2022-07-06 NOTE — H&P (Signed)
PCP:   Lorenda Ishihara, MD   Chief Complaint:  She found down  HPI: This is a 89 year old female with a history of mild dementia, sinus with cardiac SP PPM placement, DM2 diet controlled, CKD stage IIIb, COPD/asthma.  Patient brought in for near syncopal event witnessed by family.  She is a fall, she did not hit her head.  On she received assistance from family members present.  In the ER patient does not recall how she got to the ER or why she was in the ER.  She states she has not been ill, denies shortness of breath, cough, wheezing, fever, altered mentation, weakness, lightheadedness or dizziness.  Patient Uses walker at home.   In the ER workup was unrevealing, UA and chest x-ray both been negative lactic acid being normal. VBG however showed a pH 7.24.  Patient was placed on BiPAP for hypercapnic respiratory failure.  Hospitalist asked to admit.  Per EDP patient wheezing on presentation to the ER.  Lives at home w/ dtr.   Review of Systems:  The patient denies anorexia, fever, weight loss,, vision loss, decreased hearing, hoarseness, chest pain, syncope, dyspnea on exertion, peripheral edema, balance deficits, hemoptysis, abdominal pain, melena, hematochezia, severe indigestion/heartburn, hematuria, incontinence, genital sores, muscle weakness, suspicious skin lesions, transient blindness, difficulty walking, depression, unusual weight change, abnormal bleeding, enlarged lymph nodes, angioedema, and breast masses.  Past Medical History: Past Medical History:  Diagnosis Date   Asthma    CKD (chronic kidney disease) stage 3, GFR 30-59 ml/min 05/01/2018   Colostomy present    hx/notes 07/17/2010   Dementia    Diabetic neuropathy    Hypertension    hx/notes 07/17/2010   Pneumonia    recent/notes 10/08/2016   Symptomatic bradycardia 12/15/2017   Type II diabetes mellitus    hx/notes 07/17/2010   Past Surgical History:  Procedure Laterality Date   ABDOMINAL HYSTERECTOMY      COLOSTOMY     S/P SBO hx/notes 07/17/2010   LUMBAR FUSION  11/2006   hx/notes 07/17/2010   NASAL SINUS SURGERY  04/28/2002   Bilateral inferior turbinate reductions, bilateral maxillary antrotomies, bilateral total ethmoidectomies, bilateral frontal recess explorations, bilateral sphenoidotomies, Instatrac guidance./notes 07/28/2010   PACEMAKER IMPLANT N/A 05/02/2018   Procedure: PACEMAKER IMPLANT;  Surgeon: Marinus Maw, MD;  Location: [facility] INVASIVE CV LAB;  Service: Cardiovascular;  Laterality: N/A;    Medications: Prior to Admission medications   Medication Sig Start Date End Date Taking? Authorizing Provider  acetaminophen (TYLENOL) 500 MG tablet Take 500 mg by mouth every 6 (six) hours as needed for moderate pain.    [provider]  ALBUTEROL SULFATE PO Take 2 puffs by mouth every 4 (four) hours as needed (Wheezing/SOB).    [provider]  busPIRone (BUSPAR) 5 MG tablet Take 1 tablet (5 mg total) by mouth 3 (three) times daily. 04/29/20   Medina-Vargas, Monina C, NP  donepezil (ARICEPT) 10 MG tablet Take 1 tablet (10 mg total) by mouth at bedtime. 09/05/20   Rhetta Mura, MD  memantine (NAMENDA XR) 28 MG CP24 24 hr capsule Take 1 capsule (28 mg total) by mouth every morning. 09/05/20   Rhetta Mura, MD    Allergies:   Allergies  Allergen Reactions   Aspirin Other (See Comments)    Triggers asthma    Social History:  reports that she has never smoked. She quit smokeless tobacco use about 5 years ago.  Her smokeless tobacco use included chew. She reports that she does  not drink alcohol and does not use drugs.  Family History: Family History  Problem Relation Age of Onset   Kidney disease Mother    Hypertension Mother    Other Father        gangrene with amputation   Diabetes Sister    Cancer Brother    Heart disease Brother    Schizophrenia Daughter    Diabetes Daughter    Stroke Daughter    Diabetes Sister    Breast cancer Neg Hx      Physical Exam: Vitals:   07/06/22 1903 07/06/22 2006 07/06/22 2213 07/06/22 2218  BP:      Pulse: 60  60   Resp: (!) 25  (!) 24   Temp:  (!) 95.9 F (35.5 C)    TempSrc:  Rectal    SpO2: 100%   100%    General:  Alert and oriented times three, well developed and nourished, no acute distress.  On BiPAP Eyes: PERRLA, pink conjunctiva, no scleral icterus ENT: Moist oral mucosa, neck supple, no thyromegaly Lungs: clear to ascultation, no wheeze, no crackles, no use of accessory muscles Cardiovascular: regular rate and rhythm, no regurgitation, no gallops, no murmurs. No carotid bruits, no JVD Abdomen: soft, positive BS, non-tender, non-distended, no organomegaly, not an acute abdomen GU: not examined Neuro: CN II - XII grossly intact, sensation intact Musculoskeletal: strength 5/5 all extremities, no clubbing, cyanosis or edema Skin: no rash, no subcutaneous crepitation, no decubitus Psych: appropriate patient   Labs on Admission:  Recent Labs    07/06/22 1947 07/06/22 2009  NA 141 144  K 4.5 4.6  CL 107  --   CO2 26  --   GLUCOSE 110*  --   BUN 50*  --   CREATININE 1.50*  --   CALCIUM 9.2  --   MG 2.1  --    Recent Labs    07/06/22 1947  AST 21  ALT 17  ALKPHOS 74  BILITOT 0.8  PROT 6.8  ALBUMIN 3.2*    Recent Labs    07/06/22 1947 07/06/22 2009  WBC 4.4  --   NEUTROABS 2.2  --   HGB 10.2* 11.2*  HCT 32.1* 33.0*  MCV 96.7  --   PLT 194  --    Recent Labs    07/06/22 1947  CKTOTAL 46    Radiological Exams on Admission: CT HEAD WO CONTRAST  Result Date: 07/06/2022 CLINICAL DATA:  Mental status change EXAM: CT HEAD WITHOUT CONTRAST TECHNIQUE: Contiguous axial images were obtained from the base of the skull through the vertex without intravenous contrast. RADIATION DOSE REDUCTION: This exam was performed according to the departmental dose-optimization program which includes automated exposure control, adjustment of the mA and/or kV according to  patient size and/or use of iterative reconstruction technique. COMPARISON:  05/09/2022 CT head FINDINGS: Brain: No evidence of acute infarction, hemorrhage, mass, mass effect, or midline shift. No hydrocephalus or extra-axial fluid collection. Remote right cerebellar infarct. Unchanged empty sella. Periventricular white matter changes, likely the sequela of chronic small vessel ischemic disease. Vascular: No hyperdense vessel. Skull: Negative for fracture or focal lesion. Sinuses/Orbits: Postsurgical changes in the paranasal sinuses, with mild mucosal thickening throughout. No acute finding in the orbits. Other: The mastoid air cells are well aerated. IMPRESSION: No acute intracranial process. Electronically Signed   By: Wiliam Ke M.D.   On: 07/06/2022 19:24   DG Chest Portable 1 View  Result Date: 07/06/2022 CLINICAL DATA:  Weakness and altered  mental status. EXAM: PORTABLE CHEST 1 VIEW COMPARISON:  Most recent radiograph 06/29/2022. CT 03/12/2022 FINDINGS: Left-sided pacemaker remains in place. Stable heart size and mediastinal contours. Chronic hyperinflation. Multiple skin folds project over the right hemithorax. No focal airspace disease, pneumothorax, pleural effusion or pulmonary edema. The bones are diffusely under mineralized. On limited assessment, no acute osseous finding. IMPRESSION: No acute abnormality or change from prior exams. Electronically Signed   By: Narda Rutherford M.D.   On: 07/06/2022 19:02    Assessment/Plan Present on Admission:  Acute respiratory failure hypercapnic/hypoxemia Acute exacerbation of COPD -COPD order set initiated -IV Solu-Medrol, followed by p.o. prednisone -Patient is on BiPAP continue continued -Albuterol as needed and scheduled -Keep sats greater than 88%, respiratory to eval and treat  CKD stage 3b -Stable at baseline,  Alzheimer's dementia, mild -Stable, Aricept resumed  Status post colostomy -Wound care consult placed   Romell Cavanah,  Alvie Fowles 07/06/2022, 11:08 PM

## 2022-07-07 ENCOUNTER — Encounter (HOSPITAL_COMMUNITY): Payer: Self-pay | Admitting: Family Medicine

## 2022-07-07 DIAGNOSIS — Z681 Body mass index (BMI) 19 or less, adult: Secondary | ICD-10-CM | POA: Diagnosis not present

## 2022-07-07 DIAGNOSIS — R234 Changes in skin texture: Secondary | ICD-10-CM | POA: Diagnosis present

## 2022-07-07 DIAGNOSIS — Z66 Do not resuscitate: Secondary | ICD-10-CM | POA: Diagnosis present

## 2022-07-07 DIAGNOSIS — E1122 Type 2 diabetes mellitus with diabetic chronic kidney disease: Secondary | ICD-10-CM | POA: Diagnosis present

## 2022-07-07 DIAGNOSIS — E869 Volume depletion, unspecified: Secondary | ICD-10-CM | POA: Diagnosis present

## 2022-07-07 DIAGNOSIS — Z886 Allergy status to analgesic agent status: Secondary | ICD-10-CM | POA: Diagnosis not present

## 2022-07-07 DIAGNOSIS — F02A3 Dementia in other diseases classified elsewhere, mild, with mood disturbance: Secondary | ICD-10-CM | POA: Diagnosis present

## 2022-07-07 DIAGNOSIS — J9621 Acute and chronic respiratory failure with hypoxia: Secondary | ICD-10-CM | POA: Diagnosis present

## 2022-07-07 DIAGNOSIS — Z79899 Other long term (current) drug therapy: Secondary | ICD-10-CM | POA: Diagnosis not present

## 2022-07-07 DIAGNOSIS — E1140 Type 2 diabetes mellitus with diabetic neuropathy, unspecified: Secondary | ICD-10-CM | POA: Diagnosis present

## 2022-07-07 DIAGNOSIS — Z7189 Other specified counseling: Secondary | ICD-10-CM | POA: Diagnosis not present

## 2022-07-07 DIAGNOSIS — R410 Disorientation, unspecified: Secondary | ICD-10-CM | POA: Diagnosis not present

## 2022-07-07 DIAGNOSIS — I5032 Chronic diastolic (congestive) heart failure: Secondary | ICD-10-CM | POA: Diagnosis present

## 2022-07-07 DIAGNOSIS — Z7401 Bed confinement status: Secondary | ICD-10-CM | POA: Diagnosis not present

## 2022-07-07 DIAGNOSIS — N179 Acute kidney failure, unspecified: Secondary | ICD-10-CM | POA: Diagnosis present

## 2022-07-07 DIAGNOSIS — I130 Hypertensive heart and chronic kidney disease with heart failure and stage 1 through stage 4 chronic kidney disease, or unspecified chronic kidney disease: Secondary | ICD-10-CM | POA: Diagnosis present

## 2022-07-07 DIAGNOSIS — J9601 Acute respiratory failure with hypoxia: Secondary | ICD-10-CM | POA: Diagnosis not present

## 2022-07-07 DIAGNOSIS — E875 Hyperkalemia: Secondary | ICD-10-CM | POA: Diagnosis present

## 2022-07-07 DIAGNOSIS — E46 Unspecified protein-calorie malnutrition: Secondary | ICD-10-CM | POA: Diagnosis present

## 2022-07-07 DIAGNOSIS — G309 Alzheimer's disease, unspecified: Secondary | ICD-10-CM | POA: Diagnosis present

## 2022-07-07 DIAGNOSIS — Z515 Encounter for palliative care: Secondary | ICD-10-CM | POA: Diagnosis not present

## 2022-07-07 DIAGNOSIS — R627 Adult failure to thrive: Secondary | ICD-10-CM | POA: Diagnosis present

## 2022-07-07 DIAGNOSIS — F0390 Unspecified dementia without behavioral disturbance: Secondary | ICD-10-CM | POA: Diagnosis not present

## 2022-07-07 DIAGNOSIS — I951 Orthostatic hypotension: Secondary | ICD-10-CM | POA: Diagnosis present

## 2022-07-07 DIAGNOSIS — J9622 Acute and chronic respiratory failure with hypercapnia: Secondary | ICD-10-CM | POA: Diagnosis present

## 2022-07-07 DIAGNOSIS — J9602 Acute respiratory failure with hypercapnia: Secondary | ICD-10-CM | POA: Diagnosis not present

## 2022-07-07 DIAGNOSIS — Z743 Need for continuous supervision: Secondary | ICD-10-CM | POA: Diagnosis not present

## 2022-07-07 DIAGNOSIS — F32A Depression, unspecified: Secondary | ICD-10-CM | POA: Diagnosis present

## 2022-07-07 DIAGNOSIS — N184 Chronic kidney disease, stage 4 (severe): Secondary | ICD-10-CM | POA: Diagnosis present

## 2022-07-07 DIAGNOSIS — Z933 Colostomy status: Secondary | ICD-10-CM | POA: Diagnosis not present

## 2022-07-07 DIAGNOSIS — J45901 Unspecified asthma with (acute) exacerbation: Secondary | ICD-10-CM | POA: Diagnosis present

## 2022-07-07 LAB — CBC
HCT: 32.3 % — ABNORMAL LOW (ref 36.0–46.0)
Hemoglobin: 10.4 g/dL — ABNORMAL LOW (ref 12.0–15.0)
MCH: 31.1 pg (ref 26.0–34.0)
MCHC: 32.2 g/dL (ref 30.0–36.0)
MCV: 96.7 fL (ref 80.0–100.0)
Platelets: 174 10*3/uL (ref 150–400)
RBC: 3.34 MIL/uL — ABNORMAL LOW (ref 3.87–5.11)
RDW: 12.3 % (ref 11.5–15.5)
WBC: 5.5 10*3/uL (ref 4.0–10.5)
nRBC: 0 % (ref 0.0–0.2)

## 2022-07-07 LAB — I-STAT VENOUS BLOOD GAS, ED
Acid-base deficit: 2 mmol/L (ref 0.0–2.0)
Bicarbonate: 22.9 mmol/L (ref 20.0–28.0)
Calcium, Ion: 1.22 mmol/L (ref 1.15–1.40)
HCT: 31 % — ABNORMAL LOW (ref 36.0–46.0)
Hemoglobin: 10.5 g/dL — ABNORMAL LOW (ref 12.0–15.0)
O2 Saturation: 96 %
Potassium: 5.1 mmol/L (ref 3.5–5.1)
Sodium: 143 mmol/L (ref 135–145)
TCO2: 24 mmol/L (ref 22–32)
pCO2, Ven: 37.4 mmHg — ABNORMAL LOW (ref 44–60)
pH, Ven: 7.395 (ref 7.25–7.43)
pO2, Ven: 85 mmHg — ABNORMAL HIGH (ref 32–45)

## 2022-07-07 LAB — BASIC METABOLIC PANEL
Anion gap: 15 (ref 5–15)
BUN: 50 mg/dL — ABNORMAL HIGH (ref 8–23)
CO2: 18 mmol/L — ABNORMAL LOW (ref 22–32)
Calcium: 9.1 mg/dL (ref 8.9–10.3)
Chloride: 108 mmol/L (ref 98–111)
Creatinine, Ser: 1.47 mg/dL — ABNORMAL HIGH (ref 0.44–1.00)
GFR, Estimated: 34 mL/min — ABNORMAL LOW (ref >60)
Glucose, Bld: 102 mg/dL — ABNORMAL HIGH (ref 70–99)
Potassium: 5.3 mmol/L — ABNORMAL HIGH (ref 3.5–5.1)
Sodium: 141 mmol/L (ref 135–145)

## 2022-07-07 LAB — CREATININE, SERUM
Creatinine, Ser: 1.4 mg/dL — ABNORMAL HIGH (ref 0.44–1.00)
GFR, Estimated: 36 mL/min — ABNORMAL LOW (ref >60)

## 2022-07-07 MED ORDER — PREDNISONE 20 MG PO TABS
40.0000 mg | ORAL_TABLET | Freq: Every day | ORAL | Status: DC
  Administered 2022-07-08: 40 mg via ORAL
  Filled 2022-07-07: qty 2

## 2022-07-07 MED ORDER — AZITHROMYCIN 500 MG PO TABS
500.0000 mg | ORAL_TABLET | Freq: Every day | ORAL | Status: DC
  Administered 2022-07-07 – 2022-07-09 (×3): 500 mg via ORAL
  Filled 2022-07-07: qty 2
  Filled 2022-07-07 (×2): qty 1

## 2022-07-07 MED ORDER — PREDNISONE 20 MG PO TABS: 20.0000 mg | ORAL_TABLET | Freq: Every day | ORAL | Status: DC

## 2022-07-07 MED ORDER — METHYLPREDNISOLONE SODIUM SUCC 40 MG IJ SOLR
40.0000 mg | Freq: Once | INTRAMUSCULAR | Status: AC
  Administered 2022-07-07: 40 mg via INTRAVENOUS
  Filled 2022-07-07: qty 1

## 2022-07-07 MED ORDER — SODIUM CHLORIDE 0.9 % IV SOLN
1.0000 g | INTRAVENOUS | Status: DC
  Administered 2022-07-07 – 2022-07-08 (×2): 1 g via INTRAVENOUS
  Filled 2022-07-07 (×2): qty 10

## 2022-07-07 MED ORDER — IPRATROPIUM-ALBUTEROL 0.5-2.5 (3) MG/3ML IN SOLN
3.0000 mL | Freq: Four times a day (QID) | RESPIRATORY_TRACT | Status: DC
  Filled 2022-07-07: qty 3

## 2022-07-07 NOTE — ED Notes (Signed)
ED TO INPATIENT HANDOFF REPORT  ED Nurse Name and Phone #:   S Name/Age/Gender [name] 89 y.o. female Room/Bed: 007C/007C  Code Status   Code Status: DNR  Home/SNF/Other Home Patient oriented to: self Is this baseline? Yes   Triage Complete: Triage complete  Chief Complaint Acute respiratory failure with hypoxia and hypercapnia [J96.01, J96.02]  Triage Note Patient BIB EMS home c/o weakness, confusion, and poor intake. Poss. UTI Found in bed with urine and feces. Poor family support. VSS. Pacemaker. CBG 109. H/O Dementia    Allergies Allergies  Allergen Reactions   Aspirin Other (See Comments)    Triggers asthma    Level of Care/Admitting Diagnosis ED Disposition     ED Disposition  Admit   Condition  --   Comment  Hospital Area: [facility] [[id number]]  Level of Care: Med-Surg [[id number]]  May admit patient to [facility] or [facility] if equivalent level of care is available:: No  Covid Evaluation: Confirmed COVID Negative  Diagnosis: Acute respiratory failure with hypoxia and hypercapnia [[id number]]  Admitting Physician: Gery Pray [[id number]]  Attending Physician: Alba Cory [phone]  Certification:: I certify this patient will need inpatient services for at least 2 midnights  Estimated Length of Stay: 3          B Medical/Surgery History Past Medical History:  Diagnosis Date   Asthma    CKD (chronic kidney disease) stage 3, GFR 30-59 ml/min 05/01/2018   Colostomy present    hx/notes 07/17/2010   Dementia    Diabetic neuropathy    Hypertension    hx/notes 07/17/2010   Pneumonia    recent/notes 10/08/2016   Symptomatic bradycardia 12/15/2017   Type II diabetes mellitus    hx/notes 07/17/2010   Past Surgical History:  Procedure Laterality Date   ABDOMINAL HYSTERECTOMY     COLOSTOMY     S/P SBO hx/notes 07/17/2010   LUMBAR FUSION  11/2006   hx/notes 07/17/2010   NASAL SINUS SURGERY  04/28/2002   Bilateral inferior turbinate  reductions, bilateral maxillary antrotomies, bilateral total ethmoidectomies, bilateral frontal recess explorations, bilateral sphenoidotomies, Instat[employer] guidance./notes 07/28/2010   PACEMAKER IMPLANT N/A 05/02/2018   Procedure: PACEMAKER IMPLANT;  Surgeon: Marinus Maw, MD;  Location: [facility] INVASIVE CV LAB;  Service: Cardiovascular;  Laterality: N/A;     A IV Location/Drains/Wounds Patient Lines/Drains/Airways Status     Active Line/Drains/Airways     Name Placement date Placement time Site Days   Peripheral IV 07/07/22 20 G Right;Posterior Forearm 07/07/22  0740  Forearm  less than 1   Colostomy LLQ --  --  LLQ  --            Intake/Output Last 24 hours  Intake/Output Summary (Last 24 hours) at 07/07/2022 1515 Last data filed at 07/07/2022 0200 Gross per 24 hour  Intake 100 ml  Output --  Net 100 ml    Labs/Imaging Results for orders placed or performed during the hospital encounter of 07/06/22 (from the past 48 hour(s))  Rapid urine drug screen (hospital performed)     Status: None   Collection Time: 07/06/22  6:39 PM  Result Value Ref Range   Opiates NONE DETECTED NONE DETECTED   Cocaine NONE DETECTED NONE DETECTED   Benzodiazepines NONE DETECTED NONE DETECTED   Amphetamines NONE DETECTED NONE DETECTED   Tetrahydrocannabinol NONE DETECTED NONE DETECTED   Barbiturates NONE DETECTED NONE DETECTED    Comment: (NOTE) DRUG SCREEN FOR MEDICAL PURPOSES ONLY.  IF CONFIRMATION  IS NEEDED FOR ANY PURPOSE, NOTIFY LAB WITHIN 5 DAYS.  LOWEST DETECTABLE LIMITS FOR URINE DRUG SCREEN Drug Class                     Cutoff (ng/mL) Amphetamine and metabolites    1000 Barbiturate and metabolites    200 Benzodiazepine                 200 Opiates and metabolites        300 Cocaine and metabolites        300 THC                            50 Performed at [facility] Lab, [address], [place], [place] [address]   Urinalysis, w/ Reflex to Culture (Infection Suspected) -Urine,  Clean Catch     Status: Abnormal   Collection Time: 07/06/22  6:39 PM  Result Value Ref Range   Specimen Source URINE, CLEAN CATCH    Color, Urine STRAW (A) YELLOW   APPearance CLEAR CLEAR   Specific Gravity, Urine 1.010 1.005 - 1.030   pH 5.0 5.0 - 8.0   Glucose, UA NEGATIVE NEGATIVE mg/dL   Hgb urine dipstick SMALL (A) NEGATIVE   Bilirubin Urine NEGATIVE NEGATIVE   Ketones, ur NEGATIVE NEGATIVE mg/dL   Protein, ur NEGATIVE NEGATIVE mg/dL   Nitrite NEGATIVE NEGATIVE   Leukocytes,Ua NEGATIVE NEGATIVE   RBC / HPF 0-5 0 - 5 RBC/hpf   WBC, UA 0-5 0 - 5 WBC/hpf    Comment:        Reflex urine culture not performed if WBC <=10, OR if Squamous epithelial cells >5. If Squamous epithelial cells >5 suggest recollection.    Bacteria, UA NONE SEEN NONE SEEN   Squamous Epithelial / HPF 0-5 0 - 5 /HPF   Hyaline Casts, UA PRESENT     Comment: Performed at [facility], [address], [place], [place] [address]  Comprehensive metabolic panel     Status: Abnormal   Collection Time: 07/06/22  7:47 PM  Result Value Ref Range   Sodium 141 135 - 145 mmol/L   Potassium 4.5 3.5 - 5.1 mmol/L   Chloride 107 98 - 111 mmol/L   CO2 26 22 - 32 mmol/L   Glucose, Bld 110 (H) 70 - 99 mg/dL    Comment: Glucose reference range applies only to samples taken after fasting for at least 8 hours.   BUN 50 (H) 8 - 23 mg/dL   Creatinine, Ser 0.98 (H) 0.44 - 1.00 mg/dL   Calcium 9.2 8.9 - 11.9 mg/dL   Total Protein 6.8 6.5 - 8.1 g/dL   Albumin 3.2 (L) 3.5 - 5.0 g/dL   AST 21 15 - 41 U/L   ALT 17 0 - 44 U/L   Alkaline Phosphatase 74 38 - 126 U/L   Total Bilirubin 0.8 0.3 - 1.2 mg/dL   GFR, Estimated 34 (L) >60 mL/min    Comment: (NOTE) Calculated using the CKD-EPI Creatinine Equation (2021)    Anion gap 8 5 - 15    Comment: Performed at [facility] Lab, [address] [place], [place] [address]  CBC with Differential/Platelet     Status: Abnormal   Collection Time: 07/06/22  7:47 PM  Result  Value Ref Range   WBC 4.4 4.0 - 10.5 K/uL   RBC 3.32 (L) 3.87 - 5.11 MIL/uL   Hemoglobin 10.2 (  L) 12.0 - 15.0 g/dL   HCT 16.1 (L) 09.6 - 04.5 %   MCV 96.7 80.0 - 100.0 fL   MCH 30.7 26.0 - 34.0 pg   MCHC 31.8 30.0 - 36.0 g/dL   RDW 40.9 81.1 - 91.4 %   Platelets 194 150 - 400 K/uL   nRBC 0.0 0.0 - 0.2 %   Neutrophils Relative % 49 %   Neutro Abs 2.2 1.7 - 7.7 K/uL   Lymphocytes Relative 38 %   Lymphs Abs 1.7 0.7 - 4.0 K/uL   Monocytes Relative 7 %   Monocytes Absolute 0.3 0.1 - 1.0 K/uL   Eosinophils Relative 5 %   Eosinophils Absolute 0.2 0.0 - 0.5 K/uL   Basophils Relative 1 %   Basophils Absolute 0.0 0.0 - 0.1 K/uL   Immature Granulocytes 0 %   Abs Immature Granulocytes 0.01 0.00 - 0.07 K/uL    Comment: Performed at [facility] Lab, [address] [place], [place] [address]  Ammonia     Status: None   Collection Time: 07/06/22  7:47 PM  Result Value Ref Range   Ammonia 10 9 - 35 umol/L    Comment: Performed at [facility] Lab, [address], [place], [place] [address]  Troponin I (High Sensitivity)     Status: None   Collection Time: 07/06/22  7:47 PM  Result Value Ref Range   Troponin I (High Sensitivity) 7 <18 ng/L    Comment: (NOTE) Elevated high sensitivity troponin I (hsTnI) values and significant  changes across serial measurements may suggest ACS but many other  chronic and acute conditions are known to elevate hsTnI results.  Refer to the "Links" section for chest pain algorithms and additional  guidance. Performed at [facility], [address], [place], [place] [address]   CK     Status: None   Collection Time: 07/06/22  7:47 PM  Result Value Ref Range   Total CK 46 38 - 234 U/L    Comment: Performed at [facility], [address] [place], [place] [address]  Magnesium     Status: None   Collection Time: 07/06/22  7:47 PM  Result Value Ref Range   Magnesium 2.1 1.7 - 2.4 mg/dL    Comment: Performed at [facility] Lab, [address], [place], [place] [address]  I-Stat venous blood gas, ED     Status: Abnormal   Collection Time: 07/06/22  8:09 PM  Result Value Ref Range   pH, Ven 7.248 (L) 7.25 - 7.43   pCO2, Ven 61.8 (H) 44 - 60 mmHg   pO2, Ven 47 (H) 32 - 45 mmHg   Bicarbonate 27.0 20.0 - 28.0 mmol/L   TCO2 29 22 - 32 mmol/L   O2 Saturation 74 %   Acid-base deficit 1.0 0.0 - 2.0 mmol/L   Sodium 144 135 - 145 mmol/L   Potassium 4.6 3.5 - 5.1 mmol/L   Calcium, Ion 1.33 1.15 - 1.40 mmol/L   HCT 33.0 (L) 36.0 - 46.0 %   Hemoglobin 11.2 (L) 12.0 - 15.0 g/dL   Sample type VENOUS   Troponin I (High Sensitivity)     Status: None   Collection Time: 07/06/22  8:40 PM  Result Value Ref Range   Troponin I (High Sensitivity) 7 <18 ng/L    Comment: (NOTE) Elevated high sensitivity troponin I (hsTnI) values and significant  changes across serial measurements may suggest ACS but many other  chronic and acute conditions are known to elevate hsTnI results.  Refer to the "Links" section for chest pain algorithms and additional  guidance. Performed at [facility] Lab, [address], [place], [place] [address]   CBC     Status: Abnormal   Collection Time: 07/07/22 12:47 AM  Result Value Ref Range   WBC 5.5 4.0 - 10.5 K/uL   RBC 3.34 (L) 3.87 - 5.11 MIL/uL   Hemoglobin 10.4 (L) 12.0 - 15.0 g/dL   HCT 60.4 (L) 54.0 - 98.1 %   MCV 96.7 80.0 - 100.0 fL   MCH 31.1 26.0 - 34.0 pg   MCHC 32.2 30.0 - 36.0 g/dL   RDW 19.1 47.8 - 29.5 %   Platelets 174 150 - 400 K/uL   nRBC 0.0 0.0 - 0.2 %    Comment: Performed at [facility] Lab, [address], [place], [place] [address]  Creatinine, serum     Status: Abnormal   Collection Time: 07/07/22 12:47 AM  Result Value Ref Range   Creatinine, Ser 1.40 (H) 0.44 - 1.00 mg/dL   GFR, Estimated 36 (L) >60 mL/min    Comment: (NOTE) Calculated using the CKD-EPI Creatinine Equation (2021) Performed at [facility] Lab, [address], [place], [place] [address]   I-Stat  venous blood gas, [facility] ED, [facility], [facility])     Status: Abnormal   Collection Time: 07/07/22  8:00 AM  Result Value Ref Range   pH, Ven 7.395 7.25 - 7.43   pCO2, Ven 37.4 (L) 44 - 60 mmHg   pO2, Ven 85 (H) 32 - 45 mmHg   Bicarbonate 22.9 20.0 - 28.0 mmol/L   TCO2 24 22 - 32 mmol/L   O2 Saturation 96 %   Acid-base deficit 2.0 0.0 - 2.0 mmol/L   Sodium 143 135 - 145 mmol/L   Potassium 5.1 3.5 - 5.1 mmol/L   Calcium, Ion 1.22 1.15 - 1.40 mmol/L   HCT 31.0 (L) 36.0 - 46.0 %   Hemoglobin 10.5 (L) 12.0 - 15.0 g/dL   Sample type VENOUS    CT HEAD WO CONTRAST  Result Date: 07/06/2022 CLINICAL DATA:  Mental status change EXAM: CT HEAD WITHOUT CONTRAST TECHNIQUE: Contiguous axial images were obtained from the base of the skull through the vertex without intravenous contrast. RADIATION DOSE REDUCTION: This exam was performed according to the departmental dose-optimization program which includes automated exposure control, adjustment of the mA and/or kV according to patient size and/or use of iterative reconstruction technique. COMPARISON:  05/09/2022 CT head FINDINGS: Brain: No evidence of acute infarction, hemorrhage, mass, mass effect, or midline shift. No hydrocephalus or extra-axial fluid collection. Remote right cerebellar infarct. Unchanged empty sella. Periventricular white matter changes, likely the sequela of chronic small vessel ischemic disease. Vascular: No hyperdense vessel. Skull: Negative for fracture or focal lesion. Sinuses/Orbits: Postsurgical changes in the paranasal sinuses, with mild mucosal thickening throughout. No acute finding in the orbits. Other: The mastoid air cells are well aerated. IMPRESSION: No acute intracranial process. Electronically Signed   By: Wiliam Ke M.D.   On: 07/06/2022 19:24   DG Chest Portable 1 View  Result Date: 07/06/2022 CLINICAL DATA:  Weakness and altered mental status. EXAM: PORTABLE CHEST 1 VIEW COMPARISON:  Most recent radiograph 06/29/2022. CT  03/12/2022 FINDINGS: Left-sided pacemaker remains in place. Stable heart size and mediastinal contours. Chronic hyperinflation. Multiple skin folds project over the right hemithorax. No focal airspace disease, pneumothorax, pleural effusion or pulmonary edema. The  bones are diffusely under mineralized. On limited assessment, no acute osseous finding. IMPRESSION: No acute abnormality or change from prior exams. Electronically Signed   By: Narda Rutherford M.D.   On: 07/06/2022 19:02    Pending Labs Unresulted Labs (From admission, onward)     Start     Ordered   07/07/22 0500  Basic metabolic panel  Tomorrow morning,   R        07/06/22 2331   Pending  HIV Antibody (routine testing w rflx)  (HIV Antibody (Routine testing w reflex) panel)  Once,   R        Pending   Pending  CBC  (heparin)  Once,   R       Comments: Baseline for heparin therapy IF NOT ALREADY DRAWN.  Notify MD if PLT < 100 K.    Pending   Pending  Creatinine, serum  (heparin)  Once,   R       Comments: Baseline for heparin therapy IF NOT ALREADY DRAWN.    Pending            Vitals/Pain Today's Vitals   07/07/22 1215 07/07/22 1230 07/07/22 1245 07/07/22 1445  BP: (!) 141/76 (!) 111/93 (!) 148/67   Pulse: 61 (!) 59 (!) 128   Resp: 17 16 (!) 23   Temp:    98 F (36.7 C)  TempSrc:    Oral  SpO2: 100% 100% 95%   Weight:      Height:      PainSc:        Isolation Precautions No active isolations  Medications Medications  donepezil (ARICEPT) tablet 10 mg (10 mg Oral Given 07/07/22 0117)  memantine (NAMENDA XR) 24 hr capsule 28 mg (28 mg Oral Given 07/07/22 1025)  busPIRone (BUSPAR) tablet 5 mg (5 mg Oral Given 07/07/22 1025)  heparin injection 5,000 Units (5,000 Units Subcutaneous Given 07/07/22 1409)  ondansetron (ZOFRAN) tablet 4 mg (has no administration in time range)    Or  ondansetron (ZOFRAN) injection 4 mg (has no administration in time range)  cefTRIAXone (ROCEPHIN) 1 g in sodium chloride 0.9 % 100 mL  IVPB (0 g Intravenous Stopped 07/07/22 0200)  azithromycin (ZITHROMAX) tablet 500 mg (500 mg Oral Given 07/07/22 1025)  methylPREDNISolone sodium succinate (SOLU-MEDROL) 40 mg/mL injection 40 mg (40 mg Intravenous Given 07/07/22 0754)    Followed by  predniSONE (DELTASONE) tablet 20 mg (has no administration in time range)  ipratropium-albuterol (DUONEB) 0.5-2.5 (3) MG/3ML nebulizer solution 3 mL (3 mLs Nebulization Given 07/06/22 2218)  methylPREDNISolone sodium succinate (SOLU-MEDROL) 40 mg/mL injection 40 mg (40 mg Intravenous Given 07/06/22 2307)    Mobility non-ambulatory     Focused Assessments Pulmonary Assessment Handoff:  Lung sounds: Bilateral Breath Sounds: Diminished O2 Device: (S) Nasal Cannula (taken off bipap) O2 Flow Rate (L/min): (S) 3 L/min    R Recommendations: See Admitting Provider Note  Report given to:   Additional Notes:  +

## 2022-07-07 NOTE — Progress Notes (Addendum)
PROGRESS NOTE    [name]  ZOX:[id number] DOB: 12/26/1933 DOA: 07/06/2022 PCP: Lorenda Ishihara, MD   Brief Narrative: 89 year old with past medical history significant for mild dementia, symptomatic bradycardia status post pacemaker, diabetes type 2, CKD stage IIIb, , asthma who was found down at home covered in urine and feces.  She was found to have weakness, decreased mental status.  Per ED notes patient has poor family support.  Evaluation in the ED patient was found to be hypothermic, acute on chronic hypercapnic respiratory failure, venous gas with a pH of 7.2, pCO2 61.  Chest x-ray without acute abnormality.  CT head no acute abnormality.  Patient was admitted for acute on chronic COPD exacerbation, and hypercapnia respiratory failure.  Patient was placed on BiPAP.   Assessment & Plan:   Principal Problem:   Acute respiratory failure with hypoxia and hypercapnia Active Problems:   COPD with acute exacerbation   1-Acute Hypoxic,  Hypercapnic Respiratory Failure Acute asthma  exacerbation , per granddaughter no prior history of COPD.  ? COPD from passive smoking  -Currently on 3 L. Oxygen. Will need evaluation for home oxygen need.  -IV Solumedrol IV today, start prednisone tomorrow.  -She was placed on BIPAP> MS improved. She is alert and conversant.  -BIPAP at HS.  -Continue with Antibiotics ceftriaxone and Azithromycin.  -PH increased to 7.3 after BIPAP used.   CKD stage IIIb:  Prior Cr 1.4 Stable monitor.   Alzheimer's dementia: -Continue with Aricept and Namenda.   Status post colostomy: Colostomy wound care following.   Chronic diastolic heart failure: Euvolemic.  Does not seem to be on lasix out patient.  She has had prior admission for dehydration.  Monitor.   Recurrent hospitalizations, in January for UTI and dehydration, in February for orthostatic hypotension and dehydration.  -will consult palliative care.    Estimated body mass index is  17.68 kg/m as calculated from the following:   Height as of this encounter: 5\' 4"  (1.626 m).   Weight as of this encounter: 46.7 kg.   DVT prophylaxis: Heparin  Code Status: Per Grad-daughter patient is full code. Updated Orders.  Family Communication: Granddaughter over phone  Disposition Plan:  Status is: Observation The patient will require care spanning > 2 midnights and should be moved to inpatient because: Acute respiratory failure.     Consultants:  Palliative care.   Procedures:  Noen  Antimicrobials:    Subjective: She is alert, conversant on BIPAP. Denies dyspnea.  Plan to proceed with removing BIPAP and monitor Resp status.   Objective: Vitals:   07/07/22 0615 07/07/22 0630 07/07/22 0645 07/07/22 0723  BP: 96/68 110/64 (!) 113/56   Pulse: 60 (!) 59 (!) 59   Resp: 18 18 18    Temp:      TempSrc:      SpO2: 100% 100% 100%   Weight:    46.7 kg  Height:    5\' 4"  (1.626 m)    Intake/Output Summary (Last 24 hours) at 07/07/2022 0729 Last data filed at 07/07/2022 0200 Gross per 24 hour  Intake 100 ml  Output --  Net 100 ml   Filed Weights   07/07/22 0723  Weight: 46.7 kg    Examination:  General exam: Appears calm and comfortable  Respiratory system: BL air movement , on BIPAP Cardiovascular system: S1 & S2 heard, RRR.  Gastrointestinal system: Abdomen is nondistended, soft and nontender. No organomegaly or masses felt. Normal bowel sounds heard. Central nervous system: Alert answer  questions.  Extremities: Symmetric 5 x 5 power.   Data Reviewed: I have personally reviewed following labs and imaging studies  CBC: Recent Labs  Lab 07/06/22 1947 07/06/22 2009 07/07/22 0047  WBC 4.4  --  5.5  NEUTROABS 2.2  --   --   HGB 10.2* 11.2* 10.4*  HCT 32.1* 33.0* 32.3*  MCV 96.7  --  96.7  PLT 194  --  174   Basic Metabolic Panel: Recent Labs  Lab 07/06/22 1947 07/06/22 2009 07/07/22 0047  NA 141 144  --   K 4.5 4.6  --   CL 107  --   --    CO2 26  --   --   GLUCOSE 110*  --   --   BUN 50*  --   --   CREATININE 1.50*  --  1.40*  CALCIUM 9.2  --   --   MG 2.1  --   --    GFR: Estimated Creatinine Clearance: 20.9 mL/min (A) (by C-G formula based on SCr of 1.4 mg/dL (H)). Liver Function Tests: Recent Labs  Lab 07/06/22 1947  AST 21  ALT 17  ALKPHOS 74  BILITOT 0.8  PROT 6.8  ALBUMIN 3.2*   No results for input(s): "LIPASE", "AMYLASE" in the last 168 hours. Recent Labs  Lab 07/06/22 1947  AMMONIA 10   Coagulation Profile: No results for input(s): "INR", "PROTIME" in the last 168 hours. Cardiac Enzymes: Recent Labs  Lab 07/06/22 1947  CKTOTAL 46   BNP (last 3 results) No results for input(s): "PROBNP" in the last 8760 hours. HbA1C: No results for input(s): "HGBA1C" in the last 72 hours. CBG: No results for input(s): "GLUCAP" in the last 168 hours. Lipid Profile: No results for input(s): "CHOL", "HDL", "LDLCALC", "TRIG", "CHOLHDL", "LDLDIRECT" in the last 72 hours. Thyroid Function Tests: No results for input(s): "TSH", "T4TOTAL", "FREET4", "T3FREE", "THYROIDAB" in the last 72 hours. Anemia Panel: No results for input(s): "VITAMINB12", "FOLATE", "FERRITIN", "TIBC", "IRON", "RETICCTPCT" in the last 72 hours. Sepsis Labs: No results for input(s): "PROCALCITON", "LATICACIDVEN" in the last 168 hours.  No results found for this or any previous visit (from the past 240 hour(s)).       Radiology Studies: CT HEAD WO CONTRAST  Result Date: 07/06/2022 CLINICAL DATA:  Mental status change EXAM: CT HEAD WITHOUT CONTRAST TECHNIQUE: Contiguous axial images were obtained from the base of the skull through the vertex without intravenous contrast. RADIATION DOSE REDUCTION: This exam was performed according to the departmental dose-optimization program which includes automated exposure control, adjustment of the mA and/or kV according to patient size and/or use of iterative reconstruction technique. COMPARISON:   05/09/2022 CT head FINDINGS: Brain: No evidence of acute infarction, hemorrhage, mass, mass effect, or midline shift. No hydrocephalus or extra-axial fluid collection. Remote right cerebellar infarct. Unchanged empty sella. Periventricular white matter changes, likely the sequela of chronic small vessel ischemic disease. Vascular: No hyperdense vessel. Skull: Negative for fracture or focal lesion. Sinuses/Orbits: Postsurgical changes in the paranasal sinuses, with mild mucosal thickening throughout. No acute finding in the orbits. Other: The mastoid air cells are well aerated. IMPRESSION: No acute intracranial process. Electronically Signed   By: Wiliam Ke M.D.   On: 07/06/2022 19:24   DG Chest Portable 1 View  Result Date: 07/06/2022 CLINICAL DATA:  Weakness and altered mental status. EXAM: PORTABLE CHEST 1 VIEW COMPARISON:  Most recent radiograph 06/29/2022. CT 03/12/2022 FINDINGS: Left-sided pacemaker remains in place. Stable heart size and mediastinal contours.  Chronic hyperinflation. Multiple skin folds project over the right hemithorax. No focal airspace disease, pneumothorax, pleural effusion or pulmonary edema. The bones are diffusely under mineralized. On limited assessment, no acute osseous finding. IMPRESSION: No acute abnormality or change from prior exams. Electronically Signed   By: Narda Rutherford M.D.   On: 07/06/2022 19:02        Scheduled Meds:  azithromycin  500 mg Oral Daily   busPIRone  5 mg Oral TID   donepezil  10 mg Oral QHS   heparin  5,000 Units Subcutaneous Q8H   memantine  28 mg Oral q morning   methylPREDNISolone (SOLU-MEDROL) injection  40 mg Intravenous Once   Followed by   Melene Muller ON 07/08/2022] predniSONE  20 mg Oral Q breakfast   Continuous Infusions:  cefTRIAXone (ROCEPHIN)  IV Stopped (07/07/22 0200)     LOS: 0 days    Time spent: 35 minutes    Fayola Meckes A Elgin Carn, MD [facility]   If 7PM-7AM, please contact  night-coverage [email or web address]  07/07/2022, 7:29 AM

## 2022-07-07 NOTE — ED Notes (Signed)
PT at bedside.

## 2022-07-07 NOTE — ED Notes (Signed)
Grandaughter [name] called stating she is patient guardian and POA and can be reached at [phone]

## 2022-07-07 NOTE — Evaluation (Signed)
Physical Therapy Evaluation Patient Details Name: [name] MRN: [id number] DOB: 17-Apr-1933 Today's Date: 07/07/2022  History of Present Illness  89 y.o. female presents to [facility] hospital on 07/06/2022 with near syncopal event with fall. Venous blood gas showed a pH of 7.24, pt placed on BiPAP for hypercapnic respiratory failure. PMH includes asthma, CKD III, colostomy, dementia, diabetic neuropathy, HTN, PNA, symptomatic bradycardia, DMII.  Clinical Impression  Pt presents to PT with deficits in cognition, cardiopulmonary function, endurance. Pt is a questionable historian due to dementia, but does not appear to be far form baseline. Pt ambulates for a short distance with support of RW, declines ambulation out of ED room because "it is too early in the morning". Pt mobilizes on 3L [place] with stable sats and denies and symptoms of orthostasis. PT will follow up in an effort to mobilize further and to reduce supplemental oxygen needs (3L O2 maintained this session per RN request as pt recently removed from BiPAP).       Recommendations for follow up therapy are one component of a multi-disciplinary discharge planning process, led by the attending physician.  Recommendations may be updated based on patient status, additional functional criteria and insurance authorization.  Follow Up Recommendations       Assistance Recommended at Discharge Frequent or constant Supervision/Assistance  Patient can return home with the following  A little help with walking and/or transfers;A little help with bathing/dressing/bathroom;Assistance with cooking/housework;Direct supervision/assist for medications management;Direct supervision/assist for financial management;Assist for transportation;Help with stairs or ramp for entrance    Equipment Recommendations None recommended by PT  Recommendations for Other Services       Functional Status Assessment Patient has had a recent decline in their functional status  and demonstrates the ability to make significant improvements in function in a reasonable and predictable amount of time.     Precautions / Restrictions Precautions Precautions: Fall Precaution Comments: monitor sats Restrictions Weight Bearing Restrictions: No      Mobility  Bed Mobility Overal bed mobility: Needs Assistance Bed Mobility: Supine to Sit, Sit to Supine     Supine to sit: Min assist Sit to supine: Supervision        Transfers Overall transfer level: Needs assistance Equipment used: Rolling walker (2 wheels) Transfers: Sit to/from Stand Sit to Stand: Min guard                Ambulation/Gait Ambulation/Gait assistance: Min guard Gait Distance (Feet): 20 Feet Assistive device: Rolling walker (2 wheels) Gait Pattern/deviations: Step-through pattern Gait velocity: reduced Gait velocity interpretation: <1.31 ft/sec, indicative of household ambulator   General Gait Details: slowed step-through Insurance risk surveyor    Modified Rankin (Stroke Patients Only)       Balance Overall balance assessment: Needs assistance Sitting-balance support: No upper extremity supported, Feet supported Sitting balance-Leahy Scale: Good     Standing balance support: Single extremity supported, Reliant on assistive device for balance Standing balance-Leahy Scale: Poor                               Pertinent Vitals/Pain Pain Assessment Pain Assessment: No/denies pain    Home Living Family/patient expects to be discharged to:: Private residence Living Arrangements: Other (Comment) (granddaughter) Available Help at Discharge: Family;Available 24 hours/day Type of Home: House Home Access: Stairs to enter Entrance Stairs-Rails: Can reach both Entrance Stairs-Number of Steps:  3   Home Layout: One level Home Equipment: Agricultural consultant (2 wheels) Additional Comments: pt provides history, differs from histoy provided in  recent admission    Prior Function Prior Level of Function : Needs assist             Mobility Comments: ambulates with RW ADLs Comments: family assist with ADL/IADLs     Hand Dominance   Dominant Hand: Right    Extremity/Trunk Assessment   Upper Extremity Assessment Upper Extremity Assessment: Overall WFL for tasks assessed    Lower Extremity Assessment Lower Extremity Assessment: Overall WFL for tasks assessed    Cervical / Trunk Assessment Cervical / Trunk Assessment: Kyphotic  Communication   Communication: No difficulties  Cognition Arousal/Alertness: Awake/alert Behavior During Therapy: WFL for tasks assessed/performed Overall Cognitive Status: History of cognitive impairments - at baseline                                 General Comments: pt with a history of dementia, disoriented to time and situation. Follows commands well, poor short term memory        General Comments General comments (skin integrity, edema, etc.): VSS on 3L [place] (pt recently removed from BiPAP), orthostatic vitals negative.    Exercises     Assessment/Plan    PT Assessment Patient needs continued PT services  PT Problem List Decreased strength;Decreased activity tolerance;Decreased balance;Decreased mobility;Decreased knowledge of use of DME;Cardiopulmonary status limiting activity;Decreased safety awareness;Decreased knowledge of precautions       PT Treatment Interventions DME instruction;Gait training;Stair training;Functional mobility training;Therapeutic activities;Therapeutic exercise;Balance training;Neuromuscular re-education;Patient/family education    PT Goals (Current goals can be found in the Care Plan section)  Acute Rehab PT Goals Patient Stated Goal: to go home PT Goal Formulation: With patient Time For Goal Achievement: 07/21/22 Potential to Achieve Goals: Good    Frequency Min 2X/week     Co-evaluation               AM-PAC PT "6  Clicks" Mobility  Outcome Measure Help needed turning from your back to your side while in a flat bed without using bedrails?: A Little Help needed moving from lying on your back to sitting on the side of a flat bed without using bedrails?: A Little Help needed moving to and from a bed to a chair (including a wheelchair)?: A Little Help needed standing up from a chair using your arms (e.g., wheelchair or bedside chair)?: A Little Help needed to walk in hospital room?: A Little Help needed climbing 3-5 steps with a railing? : A Lot 6 Click Score: 17    End of Session Equipment Utilized During Treatment: Oxygen Activity Tolerance: Patient tolerated treatment well Patient left: in bed;with call bell/phone within reach Nurse Communication: Mobility status PT Visit Diagnosis: Other abnormalities of gait and mobility (R26.89);Muscle weakness (generalized) (M62.81)    Time: 1610-9604 PT Time Calculation (min) (ACUTE ONLY): 15 min   Charges:   PT Evaluation $PT Eval Low Complexity: 1 Low          Arlyss Gandy, PT, DPT Acute Rehabilitation Office [phone]   Arlyss Gandy 07/07/2022, 10:47 AM

## 2022-07-07 NOTE — Consult Note (Signed)
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 Our team has seen in the past and there is a notation from one of my associates that she has had a stoma since 2004. An admission from 2014 is noted in record review due to a partial obstruction and parastomal hernia. Patient admitted with chronic kidney disease, demential and a syncopal episode at home.. Nursing is requesting ostomy product provision. Stomal assessment/size: Not measured today Peristomal assessment: Not seen today (pouch intact) Treatment options for stomal/peristomal skin: N/A Ostomy pouching: 1pc.convex Gigi Gin # [phone]. Staff is instructed to order 5 pouches to bedside for in house use. Education provided: None Enrolled patient in [employer] DC program: No. Patient/family is established with a supplier.   WOC nursing team will not follow, but will remain available to this patient, the nursing and medical teams.  Please re-consult if needed.  Thank you for inviting [place] to participate in this patient's Plan of Care.  Ladona Mow, MSN, RN, CNS, GNP, Leda Min, [employer], [employer] phone:  [phone]

## 2022-07-07 NOTE — Evaluation (Signed)
Clinical/Bedside Swallow Evaluation Patient Details  Name: [name] MRN: [id number] Date of Birth: Oct 26, 1933  Today's Date: 07/07/2022 Time: SLP Start Time (ACUTE ONLY): 1429 SLP Stop Time (ACUTE ONLY): 1442 SLP Time Calculation (min) (ACUTE ONLY): 13 min  Past Medical History:  Past Medical History:  Diagnosis Date   Asthma    CKD (chronic kidney disease) stage 3, GFR 30-59 ml/min 05/01/2018   Colostomy present    hx/notes 07/17/2010   Dementia    Diabetic neuropathy    Hypertension    hx/notes 07/17/2010   Pneumonia    recent/notes 10/08/2016   Symptomatic bradycardia 12/15/2017   Type II diabetes mellitus    hx/notes 07/17/2010   Past Surgical History:  Past Surgical History:  Procedure Laterality Date   ABDOMINAL HYSTERECTOMY     COLOSTOMY     S/P SBO hx/notes 07/17/2010   LUMBAR FUSION  11/2006   hx/notes 07/17/2010   NASAL SINUS SURGERY  04/28/2002   Bilateral inferior turbinate reductions, bilateral maxillary antrotomies, bilateral total ethmoidectomies, bilateral frontal recess explorations, bilateral sphenoidotomies, [employer] guidance./notes 07/28/2010   PACEMAKER IMPLANT N/A 05/02/2018   Procedure: PACEMAKER IMPLANT;  Surgeon: Marinus Maw, MD;  Location: [facility] INVASIVE CV LAB;  Service: Cardiovascular;  Laterality: N/A;   HPI:  89 y.o. female presents to [facility] hospital on 07/06/2022 with near syncopal event with fall. Venous blood gas showed a pH of 7.24, pt placed on BiPAP for hypercapnic respiratory failure. CXR and CT Head both without acute changes. Previous swallow eval by SLP in June 2018 without concern for dysphagia. PMH includes asthma, CKD III, colostomy, dementia, diabetic neuropathy, HTN, PNA, symptomatic bradycardia, DMII.    Assessment / Plan / Recommendation  Clinical Impression  Pt appears to have a cognitively-based dysphagia, primarily characterized by impulsivity and incomplete oral clearance that leads to occasional coughing as her mouth becomes too full.  When offered smaller bites/sips at a time, she has no further coughing and clears her oral cavity completely even when textures are solid/dry. Recommend that she remain on regular solids and thin liquids but with assistance during meals for pacing. SLP will f/u briefly. SLP Visit Diagnosis: Dysphagia, unspecified (R13.10)    Aspiration Risk  Mild aspiration risk    Diet Recommendation Regular;Thin liquid   Liquid Administration via: Cup;Straw Medication Administration: Whole meds with puree Supervision: Staff to assist with self feeding;Full supervision/cueing for compensatory strategies Compensations: Minimize environmental distractions;Slow rate;Small sips/bites Postural Changes: Seated upright at 90 degrees;Remain upright for at least 30 minutes after po intake    Other  Recommendations Oral Care Recommendations: Oral care BID    Recommendations for follow up therapy are one component of a multi-disciplinary discharge planning process, led by the attending physician.  Recommendations may be updated based on patient status, additional functional criteria and insurance authorization.  Follow up Recommendations No SLP follow up      Assistance Recommended at Discharge    Functional Status Assessment Patient has had a recent decline in their functional status and demonstrates the ability to make significant improvements in function in a reasonable and predictable amount of time.  Frequency and Duration min 2x/week  1 week       Prognosis Barriers to Reach Goals: Cognitive deficits      Swallow Study   General HPI: 89 y.o. female presents to [facility] hospital on 07/06/2022 with near syncopal event with fall. Venous blood gas showed a pH of 7.24, pt placed on BiPAP for hypercapnic respiratory failure. CXR and  CT Head both without acute changes. Previous swallow eval by SLP in June 2018 without concern for dysphagia. PMH includes asthma, CKD III, colostomy, dementia, diabetic neuropathy, HTN,  PNA, symptomatic bradycardia, DMII. Type of Study: Bedside Swallow Evaluation Previous Swallow Assessment: see HPI Diet Prior to this Study: Regular;Thin liquids (Level 0) Temperature Spikes Noted: No Respiratory Status: Nasal cannula History of Recent Intubation: No Behavior/Cognition: Alert;Cooperative;Pleasant mood;Impulsive;Requires cueing Oral Cavity Assessment: Within Functional Limits Oral Care Completed by SLP: No Oral Cavity - Dentition: Poor condition;Missing dentition Vision: Functional for self-feeding Self-Feeding Abilities: Needs assist Patient Positioning: Upright in bed Baseline Vocal Quality: Normal Volitional Swallow: Able to elicit    Oral/Motor/Sensory Function Overall Oral Motor/Sensory Function: Within functional limits   Ice Chips Ice chips: Not tested   Thin Liquid Thin Liquid: Impaired Presentation: Self Fed;Straw Pharyngeal  Phase Impairments: Cough - Immediate    Nectar Thick Nectar Thick Liquid: Not tested   Honey Thick Honey Thick Liquid: Not tested   Puree Puree: Impaired Presentation: Spoon Oral Phase Functional Implications: Oral holding   Solid     Solid: Impaired Presentation: Self Fed Oral Phase Functional Implications: Oral holding Pharyngeal Phase Impairments: Cough - Immediate      Mahala Menghini., M.A. CCC-SLP Acute Rehabilitation Services Office [phone]  Secure chat preferred  07/07/2022,2:49 PM

## 2022-07-07 NOTE — Progress Notes (Signed)
Patient transferred from ED at 1700 pm via bed. Alert and oriented

## 2022-07-07 NOTE — ED Notes (Addendum)
ED TO INPATIENT HANDOFF REPORT  ED Nurse Name and Phone #: [phone]  S Name/Age/Gender [name] 89 y.o. female Room/Bed: 007C/007C  Code Status   Code Status: DNR  Home/SNF/Other Home or SNF Patient oriented to: self Is this baseline? Yes   Triage Complete: Triage complete  Chief Complaint Acute respiratory failure with hypoxia and hypercapnia [J96.01, J96.02]  Triage Note Patient BIB EMS home c/o weakness, confusion, and poor intake. Poss. UTI Found in bed with urine and feces. Poor family support. VSS. Pacemaker. CBG 109. H/O Dementia    Allergies Allergies  Allergen Reactions   Aspirin Other (See Comments)    Triggers asthma    Level of Care/Admitting Diagnosis ED Disposition     ED Disposition  Admit   Condition  --   Comment  Hospital Area: [facility] [[id number]]  Level of Care: Med-Surg [[id number]]  May place patient in observation at [facility] or [facility] if equivalent level of care is available:: No  Covid Evaluation: Confirmed COVID Negative  Diagnosis: Acute respiratory failure with hypoxia and hypercapnia [[id number]]  Admitting Physician: Gery Pray [[id number]]  Attending Physician: Alvester Chou          B Medical/Surgery History Past Medical History:  Diagnosis Date   Asthma    CKD (chronic kidney disease) stage 3, GFR 30-59 ml/min 05/01/2018   Colostomy present    hx/notes 07/17/2010   Dementia    Diabetic neuropathy    Hypertension    hx/notes 07/17/2010   Pneumonia    recent/notes 10/08/2016   Symptomatic bradycardia 12/15/2017   Type II diabetes mellitus    hx/notes 07/17/2010   Past Surgical History:  Procedure Laterality Date   ABDOMINAL HYSTERECTOMY     COLOSTOMY     S/P SBO hx/notes 07/17/2010   LUMBAR FUSION  11/2006   hx/notes 07/17/2010   NASAL SINUS SURGERY  04/28/2002   Bilateral inferior turbinate reductions, bilateral maxillary antrotomies, bilateral total ethmoidectomies, bilateral frontal recess  explorations, bilateral sphenoidotomies, Instatrac guidance./notes 07/28/2010   PACEMAKER IMPLANT N/A 05/02/2018   Procedure: PACEMAKER IMPLANT;  Surgeon: Marinus Maw, MD;  Location: [facility] INVASIVE CV LAB;  Service: Cardiovascular;  Laterality: N/A;     A IV Location/Drains/Wounds Patient Lines/Drains/Airways Status     Active Line/Drains/Airways     Name Placement date Placement time Site Days   Peripheral IV 07/07/22 20 G Right;Posterior Forearm 07/07/22  0740  Forearm  less than 1   Colostomy LLQ --  --  LLQ  --            Intake/Output Last 24 hours  Intake/Output Summary (Last 24 hours) at 07/07/2022 6213 Last data filed at 07/07/2022 0200 Gross per 24 hour  Intake 100 ml  Output --  Net 100 ml    Labs/Imaging Results for orders placed or performed during the hospital encounter of 07/06/22 (from the past 48 hour(s))  Rapid urine drug screen (hospital performed)     Status: None   Collection Time: 07/06/22  6:39 PM  Result Value Ref Range   Opiates NONE DETECTED NONE DETECTED   Cocaine NONE DETECTED NONE DETECTED   Benzodiazepines NONE DETECTED NONE DETECTED   Amphetamines NONE DETECTED NONE DETECTED   Tetrahydrocannabinol NONE DETECTED NONE DETECTED   Barbiturates NONE DETECTED NONE DETECTED    Comment: (NOTE) DRUG SCREEN FOR MEDICAL PURPOSES ONLY.  IF CONFIRMATION IS NEEDED FOR ANY PURPOSE, NOTIFY LAB WITHIN 5 DAYS.  LOWEST DETECTABLE LIMITS FOR URINE DRUG SCREEN  Drug Class                     Cutoff (ng/mL) Amphetamine and metabolites    1000 Barbiturate and metabolites    200 Benzodiazepine                 200 Opiates and metabolites        300 Cocaine and metabolites        300 THC                            50 Performed at [facility] Lab, [address], [place], [place] [address]   Urinalysis, w/ Reflex to Culture (Infection Suspected) -Urine, Clean Catch     Status: Abnormal   Collection Time: 07/06/22  6:39 PM  Result Value Ref Range    Specimen Source URINE, CLEAN CATCH    Color, Urine STRAW (A) YELLOW   APPearance CLEAR CLEAR   Specific Gravity, Urine 1.010 1.005 - 1.030   pH 5.0 5.0 - 8.0   Glucose, UA NEGATIVE NEGATIVE mg/dL   Hgb urine dipstick SMALL (A) NEGATIVE   Bilirubin Urine NEGATIVE NEGATIVE   Ketones, ur NEGATIVE NEGATIVE mg/dL   Protein, ur NEGATIVE NEGATIVE mg/dL   Nitrite NEGATIVE NEGATIVE   Leukocytes,Ua NEGATIVE NEGATIVE   RBC / HPF 0-5 0 - 5 RBC/hpf   WBC, UA 0-5 0 - 5 WBC/hpf    Comment:        Reflex urine culture not performed if WBC <=10, OR if Squamous epithelial cells >5. If Squamous epithelial cells >5 suggest recollection.    Bacteria, UA NONE SEEN NONE SEEN   Squamous Epithelial / HPF 0-5 0 - 5 /HPF   Hyaline Casts, UA PRESENT     Comment: Performed at [facility], [address], [place], [place] [address]  Comprehensive metabolic panel     Status: Abnormal   Collection Time: 07/06/22  7:47 PM  Result Value Ref Range   Sodium 141 135 - 145 mmol/L   Potassium 4.5 3.5 - 5.1 mmol/L   Chloride 107 98 - 111 mmol/L   CO2 26 22 - 32 mmol/L   Glucose, Bld 110 (H) 70 - 99 mg/dL    Comment: Glucose reference range applies only to samples taken after fasting for at least 8 hours.   BUN 50 (H) 8 - 23 mg/dL   Creatinine, Ser 0.98 (H) 0.44 - 1.00 mg/dL   Calcium 9.2 8.9 - 11.9 mg/dL   Total Protein 6.8 6.5 - 8.1 g/dL   Albumin 3.2 (L) 3.5 - 5.0 g/dL   AST 21 15 - 41 U/L   ALT 17 0 - 44 U/L   Alkaline Phosphatase 74 38 - 126 U/L   Total Bilirubin 0.8 0.3 - 1.2 mg/dL   GFR, Estimated 34 (L) >60 mL/min    Comment: (NOTE) Calculated using the CKD-EPI Creatinine Equation (2021)    Anion gap 8 5 - 15    Comment: Performed at [facility] Lab, [address] [place], [place] [address]  CBC with Differential/Platelet     Status: Abnormal   Collection Time: 07/06/22  7:47 PM  Result Value Ref Range   WBC 4.4 4.0 - 10.5 K/uL   RBC 3.32 (L) 3.87 - 5.11 MIL/uL   Hemoglobin 10.2 (L)  12.0 - 15.0 g/dL   HCT 95.6 (L) 21.3 - 08.6 %   MCV 96.7  80.0 - 100.0 fL   MCH 30.7 26.0 - 34.0 pg   MCHC 31.8 30.0 - 36.0 g/dL   RDW 16.1 09.6 - 04.5 %   Platelets 194 150 - 400 K/uL   nRBC 0.0 0.0 - 0.2 %   Neutrophils Relative % 49 %   Neutro Abs 2.2 1.7 - 7.7 K/uL   Lymphocytes Relative 38 %   Lymphs Abs 1.7 0.7 - 4.0 K/uL   Monocytes Relative 7 %   Monocytes Absolute 0.3 0.1 - 1.0 K/uL   Eosinophils Relative 5 %   Eosinophils Absolute 0.2 0.0 - 0.5 K/uL   Basophils Relative 1 %   Basophils Absolute 0.0 0.0 - 0.1 K/uL   Immature Granulocytes 0 %   Abs Immature Granulocytes 0.01 0.00 - 0.07 K/uL    Comment: Performed at [facility] Lab, [address] [place], [place] [address]  Ammonia     Status: None   Collection Time: 07/06/22  7:47 PM  Result Value Ref Range   Ammonia 10 9 - 35 umol/L    Comment: Performed at [facility], [address] [place], [place] [address]  Troponin I (High Sensitivity)     Status: None   Collection Time: 07/06/22  7:47 PM  Result Value Ref Range   Troponin I (High Sensitivity) 7 <18 ng/L    Comment: (NOTE) Elevated high sensitivity troponin I (hsTnI) values and significant  changes across serial measurements may suggest ACS but many other  chronic and acute conditions are known to elevate hsTnI results.  Refer to the "Links" section for chest pain algorithms and additional  guidance. Performed at [facility] Lab, [address] [place], [place] [address]   CK     Status: None   Collection Time: 07/06/22  7:47 PM  Result Value Ref Range   Total CK 46 38 - 234 U/L    Comment: Performed at [facility] Lab, [address] [place], [place] [address]  Magnesium     Status: None   Collection Time: 07/06/22  7:47 PM  Result Value Ref Range   Magnesium 2.1 1.7 - 2.4 mg/dL    Comment: Performed at [facility] Lab, [address] [place], [place] [address]  I-Stat venous blood gas, ED     Status: Abnormal   Collection  Time: 07/06/22  8:09 PM  Result Value Ref Range   pH, Ven 7.248 (L) 7.25 - 7.43   pCO2, Ven 61.8 (H) 44 - 60 mmHg   pO2, Ven 47 (H) 32 - 45 mmHg   Bicarbonate 27.0 20.0 - 28.0 mmol/L   TCO2 29 22 - 32 mmol/L   O2 Saturation 74 %   Acid-base deficit 1.0 0.0 - 2.0 mmol/L   Sodium 144 135 - 145 mmol/L   Potassium 4.6 3.5 - 5.1 mmol/L   Calcium, Ion 1.33 1.15 - 1.40 mmol/L   HCT 33.0 (L) 36.0 - 46.0 %   Hemoglobin 11.2 (L) 12.0 - 15.0 g/dL   Sample type VENOUS   Troponin I (High Sensitivity)     Status: None   Collection Time: 07/06/22  8:40 PM  Result Value Ref Range   Troponin I (High Sensitivity) 7 <18 ng/L    Comment: (NOTE) Elevated high sensitivity troponin I (hsTnI) values and significant  changes across serial measurements may suggest ACS but many other  chronic and acute conditions are known to elevate hsTnI results.  Refer to the "Links" section for  chest pain algorithms and additional  guidance. Performed at [facility] Lab, [address], [place], [place] [address]   CBC     Status: Abnormal   Collection Time: 07/07/22 12:47 AM  Result Value Ref Range   WBC 5.5 4.0 - 10.5 K/uL   RBC 3.34 (L) 3.87 - 5.11 MIL/uL   Hemoglobin 10.4 (L) 12.0 - 15.0 g/dL   HCT 60.4 (L) 54.0 - 98.1 %   MCV 96.7 80.0 - 100.0 fL   MCH 31.1 26.0 - 34.0 pg   MCHC 32.2 30.0 - 36.0 g/dL   RDW 19.1 47.8 - 29.5 %   Platelets 174 150 - 400 K/uL   nRBC 0.0 0.0 - 0.2 %    Comment: Performed at [facility] Lab, [address] [place], [place] [address]  Creatinine, serum     Status: Abnormal   Collection Time: 07/07/22 12:47 AM  Result Value Ref Range   Creatinine, Ser 1.40 (H) 0.44 - 1.00 mg/dL   GFR, Estimated 36 (L) >60 mL/min    Comment: (NOTE) Calculated using the CKD-EPI Creatinine Equation (2021) Performed at [facility] Lab, [address], [place], [place] [address]   I-Stat venous blood gas, [facility], [facility], [facility])     Status: Abnormal   Collection Time: 07/07/22  8:00 AM   Result Value Ref Range   pH, Ven 7.395 7.25 - 7.43   pCO2, Ven 37.4 (L) 44 - 60 mmHg   pO2, Ven 85 (H) 32 - 45 mmHg   Bicarbonate 22.9 20.0 - 28.0 mmol/L   TCO2 24 22 - 32 mmol/L   O2 Saturation 96 %   Acid-base deficit 2.0 0.0 - 2.0 mmol/L   Sodium 143 135 - 145 mmol/L   Potassium 5.1 3.5 - 5.1 mmol/L   Calcium, Ion 1.22 1.15 - 1.40 mmol/L   HCT 31.0 (L) 36.0 - 46.0 %   Hemoglobin 10.5 (L) 12.0 - 15.0 g/dL   Sample type VENOUS    CT HEAD WO CONTRAST  Result Date: 07/06/2022 CLINICAL DATA:  Mental status change EXAM: CT HEAD WITHOUT CONTRAST TECHNIQUE: Contiguous axial images were obtained from the base of the skull through the vertex without intravenous contrast. RADIATION DOSE REDUCTION: This exam was performed according to the departmental dose-optimization program which includes automated exposure control, adjustment of the mA and/or kV according to patient size and/or use of iterative reconstruction technique. COMPARISON:  05/09/2022 CT head FINDINGS: Brain: No evidence of acute infarction, hemorrhage, mass, mass effect, or midline shift. No hydrocephalus or extra-axial fluid collection. Remote right cerebellar infarct. Unchanged empty sella. Periventricular white matter changes, likely the sequela of chronic small vessel ischemic disease. Vascular: No hyperdense vessel. Skull: Negative for fracture or focal lesion. Sinuses/Orbits: Postsurgical changes in the paranasal sinuses, with mild mucosal thickening throughout. No acute finding in the orbits. Other: The mastoid air cells are well aerated. IMPRESSION: No acute intracranial process. Electronically Signed   By: Wiliam Ke M.D.   On: 07/06/2022 19:24   DG Chest Portable 1 View  Result Date: 07/06/2022 CLINICAL DATA:  Weakness and altered mental status. EXAM: PORTABLE CHEST 1 VIEW COMPARISON:  Most recent radiograph 06/29/2022. CT 03/12/2022 FINDINGS: Left-sided pacemaker remains in place. Stable heart size and mediastinal contours.  Chronic hyperinflation. Multiple skin folds project over the right hemithorax. No focal airspace disease, pneumothorax, pleural effusion or pulmonary edema. The bones are diffusely under mineralized. On limited assessment, no acute osseous finding. IMPRESSION: No acute abnormality or  change from prior exams. Electronically Signed   By: Narda Rutherford M.D.   On: 07/06/2022 19:02    Pending Labs Unresulted Labs (From admission, onward)     Start     Ordered   07/07/22 0500  Basic metabolic panel  Tomorrow morning,   R        07/06/22 2331   Pending  HIV Antibody (routine testing w rflx)  (HIV Antibody (Routine testing w reflex) panel)  Once,   R        Pending   Pending  CBC  (heparin)  Once,   R       Comments: Baseline for heparin therapy IF NOT ALREADY DRAWN.  Notify MD if PLT < 100 K.    Pending   Pending  Creatinine, serum  (heparin)  Once,   R       Comments: Baseline for heparin therapy IF NOT ALREADY DRAWN.    Pending            Vitals/Pain Today's Vitals   07/07/22 0723 07/07/22 0745 07/07/22 0815 07/07/22 0900  BP:  (!) 117/56  (!) 126/94  Pulse:  60 64 60  Resp:  Temp:      TempSrc:      SpO2:  100% 100% 100%  Weight: 103 lb (46.7 kg)     Height:  (1.626 m)     PainSc:        Isolation Precautions No active isolations  Medications Medications  donepezil (ARICEPT) tablet 10 mg (10 mg Oral Given 07/07/22 0117)  memantine (NAMENDA XR) 24 hr capsule 28 mg (has no administration in time range)  busPIRone (BUSPAR) tablet 5 mg (5 mg Oral Given 07/07/22 0116)  heparin injection 5,000 Units (5,000 Units Subcutaneous Given 07/07/22 0545)  ondansetron (ZOFRAN) tablet 4 mg (has no administration in time range)    Or  ondansetron (ZOFRAN) injection 4 mg (has no administration in time range)  cefTRIAXone (ROCEPHIN) 1 g in sodium chloride 0.9 % 100 mL IVPB (0 g Intravenous Stopped 07/07/22 0200)  azithromycin (ZITHROMAX) tablet 500 mg (has no administration  in time range)  methylPREDNISolone sodium succinate (SOLU-MEDROL) 40 mg/mL injection 40 mg (40 mg Intravenous Given 07/07/22 0754)    Followed by  predniSONE (DELTASONE) tablet 20 mg (has no administration in time range)  ipratropium-albuterol (DUONEB) 0.5-2.5 (3) MG/3ML nebulizer solution 3 mL (3 mLs Nebulization Given 07/06/22 2218)  methylPREDNISolone sodium succinate (SOLU-MEDROL) 40 mg/mL injection 40 mg (40 mg Intravenous Given 07/06/22 2307)    Mobility walks with device     Focused Assessments Pulmonary Assessment Handoff:  Lung sounds: Bilateral Breath Sounds: Diminished O2 Device: (S) Nasal Cannula (taken off bipap) O2 Flow Rate (L/min): (S) 3 L/min    R Recommendations: See Admitting Provider Note  Report given to:   Additional Notes: [id number]

## 2022-07-07 NOTE — ED Notes (Signed)
Pt taken off bipap at this time and placed on 3L nasal cannula without complication. VS stable and no further needs from pt at this time. RT will continue to monitor and be available as needed.

## 2022-07-08 DIAGNOSIS — Z7189 Other specified counseling: Secondary | ICD-10-CM | POA: Diagnosis not present

## 2022-07-08 DIAGNOSIS — J9602 Acute respiratory failure with hypercapnia: Secondary | ICD-10-CM | POA: Diagnosis not present

## 2022-07-08 DIAGNOSIS — F0390 Unspecified dementia without behavioral disturbance: Secondary | ICD-10-CM | POA: Diagnosis not present

## 2022-07-08 DIAGNOSIS — J9601 Acute respiratory failure with hypoxia: Secondary | ICD-10-CM | POA: Diagnosis not present

## 2022-07-08 DIAGNOSIS — Z515 Encounter for palliative care: Secondary | ICD-10-CM

## 2022-07-08 MED ORDER — IPRATROPIUM-ALBUTEROL 0.5-2.5 (3) MG/3ML IN SOLN: 3.0000 mL | Freq: Four times a day (QID) | RESPIRATORY_TRACT | Status: DC | PRN

## 2022-07-08 MED ORDER — PREDNISONE 20 MG PO TABS
40.0000 mg | ORAL_TABLET | Freq: Every day | ORAL | Status: DC
  Administered 2022-07-09: 40 mg via ORAL
  Filled 2022-07-08: qty 2

## 2022-07-08 NOTE — Progress Notes (Signed)
Pt states she does not want to wear a cpap and she does not need breathing treatments.

## 2022-07-08 NOTE — Progress Notes (Signed)
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 Lumbar stenosis status post decompression 2008 HFpEF Asthmatic COPD DM ty ii Multiple admissions for AKI superimposed on CKD 4 Mild dementia  Recent evaluation ED 06/29/2022--With presyncope-- this was felt to be secondary to orthostasis at the time-pacemaker was interrogated and patient was discharged back home   return to ED 4/22 confused found in bed with urine and feces--on arrival slightly hypothermic 95 9 borderline bradycardic 50s blood pressure normal CXR no focal anomaly anemia 10 troponin 7 CK normal baseline creatinine of 1.5 no other abnormalities although increased BUN placed on BiPAP initially in the ED Rx Solu-Medrol Rx DuoNeb  Weaned off of BiPAP to at bedtime only  Hospital-Problem based course  Acute type I and type II respiratory failure on admission secondary to acute asthma exacerbation  patient improved rapidly on Solu-Medrol and is currently on prednisone 40 daily for 4 days Rocephin discontinued complete another 2 days of azithromycin given unsure of diagnosis with regards to respiratory issues Not on BiPAP since admission and has rapidly resolved Discharged on albuterol inhalers  AKI on admit superimposed on CKD 4 Mild hyperkalemia Repeat labs in the morning-watch creatinine trend--would hold diuretics unless potassium remains high (in retrospect that may be why she is on potassium?)  Proctocolectomy in 2004 for spontaneous rupture of colon now with chronic colostomy since that time Appreciate wound care nurse insights  HFpEF Orthostatic hypotension and volume depletion Would keep volumes even today and would not place on diuretics given her advanced age and multiple prior admissions for AKI  superimposed on CKD  Adult failure to thrive Moderate to severe malnutrition with BMI of 17 Palliative care insights appreciated-patient is now DNR DNI and granddaughter discussed with hospice liaison's and it appears that she will be going home with home hospice  Mild dementia with possible depression Continue Namenda 28.  in addition to Aricept 10 and BuSpar 5 3 times daily  DVT prophylaxis: Heparin Code Status: DNR confirmed Family Communication: None available at bedside Disposition:  Status is: Inpatient Remains inpatient appropriate because:   Likely can DC home with home hospice in a.m./25    Subjective: Awake coherent no distress looks comfortable sitting up in chair not on oxygen She is pleasantly confused  Objective: Vitals:   07/07/22 1714 07/07/22 2015 07/08/22 0546 07/08/22 0744  BP: (!) 119/52 101/60 102/67 (!) 110/57  Pulse: (!) 59 65 79 60  Resp: Temp: 98.1 F (36.7 C) 99.1 F (37.3 C) 98 F (36.7 C) 98.2 F (36.8 C)  TempSrc: Oral Oral Oral   SpO2: 100% 100% 100% 100%  Weight:      Height:        Intake/Output Summary (Last 24 hours) at 07/08/2022 0932 Last data filed at 07/08/2022 0700 Gross per 24 hour  Intake --  Output 200 ml  Net -200 ml   Filed Weights   07/07/22 0723  Weight: 46.7 kg    Examination:  Frail black female in no distress no icterus no pallor CTAB no added sounds no wheeze S1-S2 no murmur Abdomen soft no rebound No lower extremity edema Euthymic but a little confused  Data Reviewed: personally reviewed   CBC    Component Value Date/Time   WBC 5.5 07/07/2022 0047   RBC 3.34 (L)  07/07/2022 0047   HGB 10.5 (L) 07/07/2022 0800   HCT 31.0 (L) 07/07/2022 0800   HCT 38.7 10/08/2016 1706   PLT 174 07/07/2022 0047   MCV 96.7 07/07/2022 0047   MCH 31.1 07/07/2022 0047   MCHC 32.2 07/07/2022 0047   RDW 12.3 07/07/2022 0047   LYMPHSABS 1.7 07/06/2022 1947   MONOABS 0.3 07/06/2022 1947   EOSABS 0.2  07/06/2022 1947   BASOSABS 0.0 07/06/2022 1947      Latest Ref Rng & Units 07/07/2022    8:00 AM 07/07/2022    1:28 AM 07/07/2022   12:47 AM  CMP  Glucose 70 - 99 mg/dL  161    BUN 8 - 23 mg/dL  50    Creatinine 0.96 - 1.00 mg/dL  0.45  4.09   Sodium 811 - 145 mmol/L 143  141    Potassium 3.5 - 5.1 mmol/L 5.1  5.3    Chloride 98 - 111 mmol/L  108    CO2 22 - 32 mmol/L  18    Calcium 8.9 - 10.3 mg/dL  9.1       Radiology Studies: CT HEAD WO CONTRAST  Result Date: 07/06/2022 CLINICAL DATA:  Mental status change EXAM: CT HEAD WITHOUT CONTRAST TECHNIQUE: Contiguous axial images were obtained from the base of the skull through the vertex without intravenous contrast. RADIATION DOSE REDUCTION: This exam was performed according to the departmental dose-optimization program which includes automated exposure control, adjustment of the mA and/or kV according to patient size and/or use of iterative reconstruction technique. COMPARISON:  05/09/2022 CT head FINDINGS: Brain: No evidence of acute infarction, hemorrhage, mass, mass effect, or midline shift. No hydrocephalus or extra-axial fluid collection. Remote right cerebellar infarct. Unchanged empty sella. Periventricular white matter changes, likely the sequela of chronic small vessel ischemic disease. Vascular: No hyperdense vessel. Skull: Negative for fracture or focal lesion. Sinuses/Orbits: Postsurgical changes in the paranasal sinuses, with mild mucosal thickening throughout. No acute finding in the orbits. Other: The mastoid air cells are well aerated. IMPRESSION: No acute intracranial process. Electronically Signed   By: Wiliam Ke M.D.   On: 07/06/2022 19:24   DG Chest Portable 1 View  Result Date: 07/06/2022 CLINICAL DATA:  Weakness and altered mental status. EXAM: PORTABLE CHEST 1 VIEW COMPARISON:  Most recent radiograph 06/29/2022. CT 03/12/2022 FINDINGS: Left-sided pacemaker remains in place. Stable heart size and mediastinal contours.  Chronic hyperinflation. Multiple skin folds project over the right hemithorax. No focal airspace disease, pneumothorax, pleural effusion or pulmonary edema. The bones are diffusely under mineralized. On limited assessment, no acute osseous finding. IMPRESSION: No acute abnormality or change from prior exams. Electronically Signed   By: Narda Rutherford M.D.   On: 07/06/2022 19:02     Scheduled Meds:  azithromycin  500 mg Oral Daily   busPIRone  5 mg Oral TID   donepezil  10 mg Oral QHS   heparin  5,000 Units Subcutaneous Q8H   memantine  28 mg Oral q morning   predniSONE  40 mg Oral Q breakfast   Continuous Infusions:  cefTRIAXone (ROCEPHIN)  IV 1 g (07/08/22 0105)     LOS: 1 day   Time spent: 26  Rhetta Mura, MD [facility] To contact the attending provider between 7A-7P or the covering provider during after hours 7P-7A, please log into the web site [email or web address] and access using universal [place] password for that web site. If you do not have the password, please call the hospital operator.  07/08/2022, 9:32 AM

## 2022-07-08 NOTE — Consult Note (Signed)
Consultation Note Date: 07/08/2022   Patient Name: [name]  DOB: Jun 05, 1933  MRN: [id number]  Age / Sex: 89 y.o., female  PCP: Lorenda Ishihara, MD Referring Physician: Rhetta Mura, MD  Reason for Consultation: Establishing goals of care  HPI/Patient Profile: 89 y.o. female  with past medical history of dementia, symptomatic bradycardia s/p PPM, HFpEF, diabetes, CKD stage 3b, asthma, COPD?, orthostatic hypotension, UTI, dehydration admitted on 07/06/2022 with syncope and fall.   Clinical Assessment and Goals of Care: Consult received and chart review completed. Noted previous palliative visits and conversations. I followed up and she was never reconnected with hospice as planned in January 2024. I met today with Ms. Leikam. She is overall appropriate in conversation but very forgetful and asking me the same questions a few minutes later. 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 I provided her with fresh ice cream and chocolate ice cream and she was happy! I speak with her about her health and needing to come back and forth to the hospital so often and she acknowledges she has been very sick. I talk to her about code status and if she would want CPR and life support. She initially tells me that she wants [place] to "try and save her life" but when I explain the pain and suffering that comes with these interventions and that her body may be too weak to get benefit and be saved if she gets that bad she agrees. She agrees that using medication and conservative measures to keep her as healthy as possible and as comfortable as possible would be good. She tells me that she is ready when her time comes and she is not scared.   I called and spoke with Tamica. Tamica and I review her grandmother's multiple chronic health issues and failure to thrive. Arlana Hove talks to the difficulties  in caregiving especially with her grandmother's urinary incontinence. I explained that incontinence is common with dementia as the brain isn't giving her the cues to know she needs to use the bathroom. I speak with Tamica about code status. When I explain my conversation with her grandmother and how her grandmother did not desire resuscitation when I explained that it would cause her more harm than help Tamica agrees with DNR. Arlana Hove asks about what to do to best care for her grandmother. I recommend hospice support at home. Arlana Hove tells me that she was hopeful for rehab and I explained that she does not seem to qualify for rehab. I discussed with her hospice benefits and also respite care which she would be interested in. She would also be interested in max support and volunteers if available to sit with her grandmother while she gets groceries. I explained that hospice will speak with her further about their services and how they can assist.   All questions/concerns addressed. Emotional support provided.   Primary Decision Maker NEXT OF KIN granddaughter Arlana Hove although patient can contribute to her wishes (but may not recall previous conversations  or decisions so I would not recommend repeated questions)    SUMMARY OF RECOMMENDATIONS   - DNR decided - Home with hospice support (granddaughter specifically requests [facility])  Code Status/Advance Care Planning: DNR   Symptom Management:  Per attending  Prognosis:  < 6 months with failure to thrive. 4 admissions and 6 ED visits over past 6 months.   Discharge Planning: Home with Hospice      Primary Diagnoses: Present on Admission: **None**   I have reviewed the medical record, interviewed the patient and family, and examined the patient. The following aspects are pertinent.  Past Medical History:  Diagnosis Date   Asthma    CKD (chronic kidney disease) stage 3, GFR 30-59 ml/min 05/01/2018   Colostomy present    hx/notes  07/17/2010   Dementia    Diabetic neuropathy    Hypertension    hx/notes 07/17/2010   Pneumonia    recent/notes 10/08/2016   Symptomatic bradycardia 12/15/2017   Type II diabetes mellitus    hx/notes 07/17/2010   Social History   Socioeconomic History   Marital status: Single    Spouse name: Not on file   Number of children: Not on file   Years of education: Not on file   Highest education level: Not on file  Occupational History   Not on file  Tobacco Use   Smoking status: Never   Smokeless tobacco: Former    Types: Chew    Quit date: 02/27/2017  Vaping Use   Vaping Use: Never used  Substance and Sexual Activity   Alcohol use: No   Drug use: No   Sexual activity: Never  Other Topics Concern   Not on file  Social History Narrative   Not on file   Social Determinants of Health   Financial Resource Strain: Not on file  Food Insecurity: No Food Insecurity (07/07/2022)   Hunger Vital Sign    Worried About Running Out of Food in the Last Year: Never true    Ran Out of Food in the Last Year: Never true  Transportation Needs: No Transportation Needs (07/07/2022)   PRAPARE - Administrator, Civil Service (Medical): No    Lack of Transportation (Non-Medical): No  Physical Activity: Not on file  Stress: Not on file  Social Connections: Not on file   Family History  Problem Relation Age of Onset   Kidney disease Mother    Hypertension Mother    Other Father        gangrene with amputation   Diabetes Sister    Cancer Brother    Heart disease Brother    Schizophrenia Daughter    Diabetes Daughter    Stroke Daughter    Diabetes Sister    Breast cancer Neg Hx    Scheduled Meds:  azithromycin  500 mg Oral Daily   busPIRone  5 mg Oral TID   donepezil  10 mg Oral QHS   heparin  5,000 Units Subcutaneous Q8H   memantine  28 mg Oral q morning   predniSONE  40 mg Oral Q breakfast   Continuous Infusions:  cefTRIAXone (ROCEPHIN)  IV 1 g (07/08/22 0105)   PRN  Meds:.ipratropium-albuterol, ondansetron **OR** ondansetron (ZOFRAN) IV Allergies  Allergen Reactions   Aspirin Other (See Comments)    Triggers asthma   Review of Systems  Constitutional:  Positive for activity change, appetite change and fatigue.  Respiratory:  Negative for shortness of breath.   Neurological:  Positive for weakness.  Physical Exam Vitals and nursing note reviewed.  Constitutional:      General: She is not in acute distress.    Appearance: She is cachectic. She is ill-appearing.  Cardiovascular:     Rate and Rhythm: Normal rate.  Pulmonary:     Effort: No tachypnea, accessory muscle usage or respiratory distress.  Abdominal:     General: Abdomen is flat.  Neurological:     Mental Status: She is alert.     Comments: Oriented to person and sometimes to place. Appropriate in conversation but will repeat the same conversation/questions a few minutes later.      Vital Signs: BP (!) 110/57 (BP Location: Left Arm)   Pulse 60   Temp 98.2 F (36.8 C)   Resp 18   Ht  (1.626 m)   Wt 46.7 kg   SpO2 100%   BMI 17.68 kg/m  Pain Scale: 0-10   Pain Score: 0-No pain   SpO2: SpO2: 100 % O2 Device:SpO2: 100 % O2 Flow Rate: .O2 Flow Rate (L/min): 2 L/min  IO: Intake/output summary:  Intake/Output Summary (Last 24 hours) at 07/08/2022 1431 Last data filed at 07/08/2022 0700 Gross per 24 hour  Intake --  Output 200 ml  Net -200 ml    LBM: Last BM Date : 07/07/22 Baseline Weight: Weight: 46.7 kg Most recent weight: Weight: 46.7 kg     Palliative Assessment/Data:     Time In: 1520  Time Total: 75 min  Greater than 50%  of this time was spent counseling and coordinating care related to the above assessment and plan.  Signed by: Yong Channel, NP Palliative Medicine Team Pager # [phone] (M-F 8a-5p) Team Phone # [phone] (Nights/Weekends)

## 2022-07-08 NOTE — Progress Notes (Signed)
Mobility Specialist - Progress Note   07/08/22 1001  Mobility  Activity Transferred from bed to chair  Level of Assistance Minimal assist, patient does 75% or more  Assistive Device Other (Comment) (HHA)  Activity Response Tolerated well  Mobility Referral Yes  $Mobility charge 1 Mobility   Pt was received in bed and agreeable to mobility. Pt was MinA to transfer to chair. No complaints throughout. Pt was left in chair with all needs met and chair alarm on.   [name]  Mobility Specialist Please contact via Special educational needs teacher or Rehab office at [phone]

## 2022-07-09 DIAGNOSIS — J9690 Respiratory failure, unspecified, unspecified whether with hypoxia or hypercapnia: Secondary | ICD-10-CM | POA: Diagnosis present

## 2022-07-09 DIAGNOSIS — J9602 Acute respiratory failure with hypercapnia: Secondary | ICD-10-CM | POA: Diagnosis not present

## 2022-07-09 DIAGNOSIS — J9601 Acute respiratory failure with hypoxia: Secondary | ICD-10-CM | POA: Diagnosis not present

## 2022-07-09 LAB — CBC WITH DIFFERENTIAL/PLATELET
Abs Immature Granulocytes: 0.02 10*3/uL (ref 0.00–0.07)
Basophils Absolute: 0 10*3/uL (ref 0.0–0.1)
Basophils Relative: 0 %
Eosinophils Absolute: 0 10*3/uL (ref 0.0–0.5)
Eosinophils Relative: 0 %
HCT: 29.1 % — ABNORMAL LOW (ref 36.0–46.0)
Hemoglobin: 9.2 g/dL — ABNORMAL LOW (ref 12.0–15.0)
Immature Granulocytes: 0 %
Lymphocytes Relative: 15 %
Lymphs Abs: 1 10*3/uL (ref 0.7–4.0)
MCH: 30.4 pg (ref 26.0–34.0)
MCHC: 31.6 g/dL (ref 30.0–36.0)
MCV: 96 fL (ref 80.0–100.0)
Monocytes Absolute: 0.4 10*3/uL (ref 0.1–1.0)
Monocytes Relative: 6 %
Neutro Abs: 5.1 10*3/uL (ref 1.7–7.7)
Neutrophils Relative %: 79 %
Platelets: 208 10*3/uL (ref 150–400)
RBC: 3.03 MIL/uL — ABNORMAL LOW (ref 3.87–5.11)
RDW: 12.7 % (ref 11.5–15.5)
WBC: 6.5 10*3/uL (ref 4.0–10.5)
nRBC: 0 % (ref 0.0–0.2)

## 2022-07-09 LAB — BASIC METABOLIC PANEL
Anion gap: 10 (ref 5–15)
BUN: 73 mg/dL — ABNORMAL HIGH (ref 8–23)
CO2: 24 mmol/L (ref 22–32)
Calcium: 8.8 mg/dL — ABNORMAL LOW (ref 8.9–10.3)
Chloride: 107 mmol/L (ref 98–111)
Creatinine, Ser: 2.42 mg/dL — ABNORMAL HIGH (ref 0.44–1.00)
GFR, Estimated: 19 mL/min — ABNORMAL LOW (ref >60)
Glucose, Bld: 142 mg/dL — ABNORMAL HIGH (ref 70–99)
Potassium: 4.7 mmol/L (ref 3.5–5.1)
Sodium: 141 mmol/L (ref 135–145)

## 2022-07-09 MED ORDER — AZITHROMYCIN 500 MG PO TABS: 500.0000 mg | ORAL_TABLET | Freq: Every day | ORAL | 0 refills | Status: DC

## 2022-07-09 MED ORDER — PREDNISONE 20 MG PO TABS: 40.0000 mg | ORAL_TABLET | Freq: Every day | ORAL | 0 refills | Status: DC

## 2022-07-09 NOTE — Discharge Summary (Addendum)
Physician Discharge Summary  [name] ZOX:[id number] DOB: January 17, 1934 DOA: 07/06/2022  PCP: Lorenda Ishihara, MD  Admit date: 07/06/2022 Discharge date: 07/09/2022  Time spent: 40 minutes  Recommendations for Outpatient Follow-up:  Going home with home hospice Delineate meds and ensure all are in keeping with comfort trajectory  Discharge Diagnoses:  MAIN problem for hospitalization   Acute type I 2 respiratory failure  AKI on admission prior proctocolectomy  adult failure to thrive  Dementia/depression Small area of R 4th toe scab Currently DNR  Please see below for itemized issues addressed in HOpsital- refer to other progress notes for clarity if needed  Discharge Condition:   Gaurded  Diet recommendation: Heart healthy  Filed Weights   07/07/22 0723  Weight: 46.7 kg    History of present illness:  89 year old white female Bradycardia + [facility] PPM since 04/2018 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 Lumbar stenosis status post decompression 2008 HFpEF Asthmatic COPD DM ty ii Multiple admissions for AKI superimposed on CKD 4 Mild dementia   Recent evaluation ED 06/29/2022--With presyncope-- this was felt to be secondary to orthostasis at the time-pacemaker was interrogated and patient was discharged back home    return to ED 4/22 confused found in bed with urine and feces--on arrival slightly hypothermic 95 9 borderline bradycardic 50s blood pressure normal CXR no focal anomaly anemia 10 troponin 7 CK normal baseline creatinine of 1.5 no other abnormalities although increased BUN placed on BiPAP initially in the ED Rx Solu-Medrol Rx DuoNeb   Weaned off of BiPAP rapidly and improved rapidly during hospital stay Palliative care consulted as below  Hospital Course:  Acute type I and type II respiratory failure on admission secondary to acute asthma exacerbation patient improved rapidly on Solu-Medrol and is currently on  prednisone 40 daily for 4 days Rocephin discontinued and patient will complete prednisone and azithromycin in the outpatient setting Long discussions as below   AKI on admit superimposed on CKD 4 Mild hyperkalemia Azotemia continue to worsen during hospitalization and meds were adjusted however in keeping with hospice philosophy patient will probably have a slow and gentle decline  very high risk for AKI   Proctocolectomy in 2004 for spontaneous rupture of colon now with chronic colostomy since that time Appreciate wound care nurse insights   HFpEF Orthostatic hypotension and volume depletion At risk for AKI and has been admitted for these issues in the past patient is very frail   Adult failure to thrive Moderate to severe malnutrition with BMI of 17 Palliative care insights appreciated-patient is now DNR DNI and granddaughter discussed with hospice liaison's and it appears that she will be going home with home hospice   Mild dementia with possible depression Continue Namenda 28.  in addition to Aricept 10 and BuSpar 5 3 times daily  R 4th toe scab NO Rx needed  Discharge Exam: Vitals:   07/09/22 0551 07/09/22 0821  BP: (!) 117/57 (!) 118/55  Pulse: 60 60  Resp: 16 18  Temp: 98.9 F (37.2 C) 98 F (36.7 C)  SpO2: 100% 100%    Subj on day of d/c   Awake a little confused but coherent Cannot tell me place time  General Exam on discharge  EOMI NCAT no focal deficit no icterus no pallor no rales no rhonchi Chest clear no added sound ROM intact No lower extremity edema No nausea  Discharge Instructions    Allergies as of 07/09/2022       Reactions  Aspirin Other (See Comments)   Triggers asthma        Medication List     STOP taking these medications    pravastatin 10 MG tablet Commonly known as: PRAVACHOL       TAKE these medications    acetaminophen 500 MG tablet Commonly known as: TYLENOL Take 500 mg by mouth every 6 (six) hours as needed  for moderate pain.   ALBUTEROL SULFATE PO Take 2 puffs by mouth every 4 (four) hours as needed (Wheezing/SOB).   azithromycin 500 MG tablet Commonly known as: ZITHROMAX Take 1 tablet (500 mg total) by mouth daily.   busPIRone 5 MG tablet Commonly known as: BUSPAR Take 1 tablet (5 mg total) by mouth 3 (three) times daily.   donepezil 10 MG tablet Commonly known as: ARICEPT Take 1 tablet (10 mg total) by mouth at bedtime.   memantine 28 MG Cp24 24 hr capsule Commonly known as: NAMENDA XR Take 1 capsule (28 mg total) by mouth every morning.   predniSONE 20 MG tablet Commonly known as: DELTASONE Take 2 tablets (40 mg total) by mouth daily with breakfast. Start taking on: July 10, 2022       Allergies  Allergen Reactions   Aspirin Other (See Comments)    Triggers asthma      The results of significant diagnostics from this hospitalization (including imaging, microbiology, ancillary and laboratory) are listed below for reference.    Significant Diagnostic Studies: CT HEAD WO CONTRAST  Result Date: 07/06/2022 CLINICAL DATA:  Mental status change EXAM: CT HEAD WITHOUT CONTRAST TECHNIQUE: Contiguous axial images were obtained from the base of the skull through the vertex without intravenous contrast. RADIATION DOSE REDUCTION: This exam was performed according to the departmental dose-optimization program which includes automated exposure control, adjustment of the mA and/or kV according to patient size and/or use of iterative reconstruction technique. COMPARISON:  05/09/2022 CT head FINDINGS: Brain: No evidence of acute infarction, hemorrhage, mass, mass effect, or midline shift. No hydrocephalus or extra-axial fluid collection. Remote right cerebellar infarct. Unchanged empty sella. Periventricular white matter changes, likely the sequela of chronic small vessel ischemic disease. Vascular: No hyperdense vessel. Skull: Negative for fracture or focal lesion. Sinuses/Orbits:  Postsurgical changes in the paranasal sinuses, with mild mucosal thickening throughout. No acute finding in the orbits. Other: The mastoid air cells are well aerated. IMPRESSION: No acute intracranial process. Electronically Signed   By: Wiliam Ke M.D.   On: 07/06/2022 19:24   DG Chest Portable 1 View  Result Date: 07/06/2022 CLINICAL DATA:  Weakness and altered mental status. EXAM: PORTABLE CHEST 1 VIEW COMPARISON:  Most recent radiograph 06/29/2022. CT 03/12/2022 FINDINGS: Left-sided pacemaker remains in place. Stable heart size and mediastinal contours. Chronic hyperinflation. Multiple skin folds project over the right hemithorax. No focal airspace disease, pneumothorax, pleural effusion or pulmonary edema. The bones are diffusely under mineralized. On limited assessment, no acute osseous finding. IMPRESSION: No acute abnormality or change from prior exams. Electronically Signed   By: Narda Rutherford M.D.   On: 07/06/2022 19:02   CUP PACEART REMOTE DEVICE CHECK  Result Date: 06/30/2022 Scheduled remote reviewed. Normal device function.  Next remote 91 days. LA, CVRS  DG Chest Port 1 View  Result Date: 06/29/2022 CLINICAL DATA:  Cough EXAM: PORTABLE CHEST 1 VIEW COMPARISON:  CXR 06/23/22 FINDINGS: Left-sided dual lead cardiac device in place with unchanged lead positioning. No pleural effusion. No pneumothorax. No focal airspace opacity. Normal cardiac and mediastinal contours. No radiographically apparent  displaced rib fractures. Visualized upper abdomen is unremarkable IMPRESSION: No focal airspace opacity. Electronically Signed   By: Lorenza Cambridge M.D.   On: 06/29/2022 15:05   DG Chest Port 1 View  Result Date: 06/23/2022 CLINICAL DATA:  Weakness EXAM: PORTABLE CHEST 1 VIEW COMPARISON:  Chest x-ray 05/09/2022 FINDINGS: Left-sided pacemaker again seen. The heart size and mediastinal contours are within normal limits. Both lungs are clear. No acute fractures are identified. Degenerative changes  affect the shoulders. IMPRESSION: No active disease. Electronically Signed   By: Darliss Cheney M.D.   On: 06/23/2022 19:05    Microbiology: No results found for this or any previous visit (from the past 240 hour(s)).   Labs: Basic Metabolic Panel: Recent Labs  Lab 07/06/22 1947 07/06/22 2009 07/07/22 0047 07/07/22 0128 07/07/22 0800 07/09/22 0515  NA 141 144  --  141 143 141  K 4.5 4.6  --  5.3* 5.1 4.7  CL 107  --   --  108  --  107  CO2 26  --   --  18*  --  24  GLUCOSE 110*  --   --  102*  --  142*  BUN 50*  --   --  50*  --  73*  CREATININE 1.50*  --  1.40* 1.47*  --  2.42*  CALCIUM 9.2  --   --  9.1  --  8.8*  MG 2.1  --   --   --   --   --    Liver Function Tests: Recent Labs  Lab 07/06/22 1947  AST 21  ALT 17  ALKPHOS 74  BILITOT 0.8  PROT 6.8  ALBUMIN 3.2*   No results for input(s): "LIPASE", "AMYLASE" in the last 168 hours. Recent Labs  Lab 07/06/22 1947  AMMONIA 10   CBC: Recent Labs  Lab 07/06/22 1947 07/06/22 2009 07/07/22 0047 07/07/22 0800 07/09/22 0515  WBC 4.4  --  5.5  --  6.5  NEUTROABS 2.2  --   --   --  5.1  HGB 10.2* 11.2* 10.4* 10.5* 9.2*  HCT 32.1* 33.0* 32.3* 31.0* 29.1*  MCV 96.7  --  96.7  --  96.0  PLT 194  --  174  --  208   Cardiac Enzymes: Recent Labs  Lab 07/06/22 1947  CKTOTAL 46   BNP: BNP (last 3 results) Recent Labs    02/25/22 2018  BNP 83.6    ProBNP (last 3 results) No results for input(s): "PROBNP" in the last 8760 hours.  CBG: No results for input(s): "GLUCAP" in the last 168 hours.     Signed:  Rhetta Mura MD   [facility] 07/09/2022, 9:36 AM

## 2022-07-09 NOTE — Progress Notes (Addendum)
Pt's granddaughter, Arlana Hove, called several times to go over the AVS. Tamica did not answer and did not call back. Will send the AVS with PTAR.  1545- PTAR arrived and took pt to off unit.

## 2022-07-09 NOTE — Progress Notes (Signed)
[facility] 2W26 [facility]) [facility] Liaison Note  Received request from [facility] [place] 4.24 for hospice services at home after discharge.  Rounded with bedside RN and patient this morning who states she is "doing great."  No symptom management needs identified.  Spoke with patient's granddaughter Arlana Hove via phone to initiate education related to hospice philosophy, services, and team approach to care.  Family verbalized understanding of the information given.  Per discussion, the plan is for discharge today via PT[facility] to home.  Granddaughter provided with [facility] contact information.  Patient currently has rolling walker at home.  Family would like to have admission nurse access needs for equipment in home during first visit.    Please send signed and completed DNR home with patient.  Please provide prescriptions at discharge as needed to ensure ongoing symptom management.  Thank you for the opportunity to participate in this patient's care.  Doreatha Martin, RN, BSN [facility] Liaison [phone]

## 2022-07-09 NOTE — Progress Notes (Signed)
Physical Therapy Treatment Patient Details Name: [name] MRN: [id number] DOB: 11-19-1933 Today's Date: 07/09/2022   History of Present Illness 89 y.o. female presents to [facility] on 07[facility]/2022 with near syncopal event with fall. Venous blood gas showed a pH of 7.24, pt placed on BiPAP for hypercapnic respiratory failure. PMH includes asthma, CKD III, colostomy, dementia, diabetic neuropathy, HTN, PNA, symptomatic bradycardia, DMII.    PT Comments    Pt was seen for mobility with request to walk and declined to do bed ex.  Pt is a bit lethargic but putting in effort to move LE's.  Her plan is to go home with granddaughter and Hospice care today, with plan to let hospice determine further care needs.  DC to home today is anticipated.   Recommendations for follow up therapy are one component of a multi-disciplinary discharge planning process, led by the attending physician.  Recommendations may be updated based on patient status, additional functional criteria and insurance authorization.  Follow Up Recommendations       Assistance Recommended at Discharge Frequent or constant Supervision/Assistance  Patient can return home with the following A little help with walking and/or transfers;A little help with bathing/dressing/bathroom;Assistance with cooking/housework;Direct supervision/assist for medications management;Direct supervision/assist for financial management;Assist for transportation;Help with stairs or ramp for entrance   Equipment Recommendations  None recommended by PT    Recommendations for Other Services       Precautions / Restrictions Precautions Precautions: Fall Precaution Comments: watch vitals Restrictions Weight Bearing Restrictions: No     Mobility  Bed Mobility               General bed mobility comments: declines OOB    Transfers                   General transfer comment: declines    Ambulation/Gait                    Stairs             Wheelchair Mobility    Modified Rankin (Stroke Patients Only)       Balance                                            Cognition Arousal/Alertness: Awake/alert Behavior During Therapy: Flat affect Overall Cognitive Status: History of cognitive impairments - at baseline                                 General Comments: pt is following instructions but loses train of thought, requires redirection        Exercises General Exercises - Lower Extremity Ankle Circles/Pumps: AAROM, 10 reps Quad Sets: AROM, AAROM, 10 reps Gluteal Sets: AROM, AAROM, 10 reps Heel Slides: AAROM, 10 reps Hip ABduction/ADduction: AROM, AAROM, 10 reps Straight Leg Raises: AAROM, 10 reps    General Comments General comments (skin integrity, edema, etc.): Pt was seen for bed ex as she is declining to move and walk, but agreed to strengthen on the bed      Pertinent Vitals/Pain Pain Assessment Pain Assessment: No/denies pain    Home Living                          Prior Function  PT Goals (current goals can now be found in the care plan section) Acute Rehab PT Goals Patient Stated Goal: to go home Progress towards PT goals: Not progressing toward goals - comment    Frequency    Min 2X/week      PT Plan Current plan remains appropriate    Co-evaluation              AM-PAC PT "6 Clicks" Mobility   Outcome Measure  Help needed turning from your back to your side while in a flat bed without using bedrails?: A Little Help needed moving from lying on your back to sitting on the side of a flat bed without using bedrails?: A Little Help needed moving to and from a bed to a chair (including a wheelchair)?: A Little Help needed standing up from a chair using your arms (e.g., wheelchair or bedside chair)?: A Little Help needed to walk in hospital room?: A Little Help needed climbing 3-5 steps with a  railing? : A Lot 6 Click Score: 17    End of Session Equipment Utilized During Treatment: Oxygen Activity Tolerance: Patient tolerated treatment well Patient left: in bed;with call bell/phone within reach Nurse Communication: Mobility status PT Visit Diagnosis: Other abnormalities of gait and mobility (R26.89);Muscle weakness (generalized) (M62.81)     Time: 1610-9604 PT Time Calculation (min) (ACUTE ONLY): 10 min  Charges:  $Therapeutic Exercise: 8-22 mins          Ivar Drape 07/09/2022, 1:40 PM  Samul Dada, PT PhD Acute Rehab Dept. Number: [facility] [id number] and [facility] [phone]

## 2022-07-09 NOTE — TOC Initial Note (Addendum)
Transition of Care [facility]) - Initial/Assessment Note    Patient Details  Name: [name] MRN: [id number] Date of Birth: 1933-05-19  Transition of Care [facility]) CM/SW Contact:    Sandy Bridgeman, RN Phone Number: 07/09/2022, 10:15 AM  Clinical Narrative:                 CM spoke with Palliative Care NP yesterday evening at 4:30 pm and referral placed with Sandy Stall, LCSW MSW with [name] and she accepted the referral and will follow up with granddaughter today, 07/09/2022.  I met with the patient at the bedside and obtained brief assessment.  No family in the room at this time.  I sent a message to [facility] this morning to follow up regarding home hospice referral.  The patient has a RW in the home at this time and once referral follow up is complete - patient can discharge home with granddaughter by car.  I called and left a message with the patient's daughter but her voicemail was full.  Discharge orders were placed by the MD for today - pending [facility] follow up.  07/09/22 - I spoke with [name] CM and [facility] will follow up in the home.  PTAR will be set up for 1230.  Bedside nursing will call and provide discharge instructions to the granddaughter by home phone.  I called and spoke with the granddaughter by phone and she is aware that patient will discharge home by [facility] since she does not drive and can not provide transportation.    [facility] home hospice will follow up in the home for needed DME when primary RN meets with the family in the home.  PTAR is set up and PTAR packet is placed at the secretary's desk for transport home with the patient.   Expected Discharge Plan: Home w Hospice Care Barriers to Discharge: No Barriers Identified (Pending evaluation by [facility] to return home with Home Hospice support.)   Patient Goals and CMS Choice Patient states their goals for this hospitalization and ongoing recovery are::  patient to return home with granddaughter CMS Medicare.gov Compare Post Acute Care list provided to:: Patient Choice offered to / list presented to :  (No family present in the room)      Expected Discharge Plan and Services   Discharge Planning Services: CM Consult   Living arrangements for the past 2 months: Single Family Home Expected Discharge Date: 07/09/22                                    Prior Living Arrangements/Services Living arrangements for the past 2 months: Single Family Home Lives with:: Relatives (Lives with granddaughter, [name]) Patient language and need for interpreter reviewed:: Yes Do you feel safe going back to the place where you live?: Yes      Need for Family Participation in Patient Care: Yes (Comment) Care giver support system in place?: Yes (comment) Current home services: DME (RW) Criminal Activity/Legal Involvement Pertinent to Current Situation/Hospitalization: No - Comment as needed  Activities of Daily Living Home Assistive Devices/Equipment: Dan Humphreys (specify type), Ostomy supplies, Shower chair with back ADL Screening (condition at time of admission) Patient's cognitive ability adequate to safely complete daily activities?: No Is the patient deaf or have difficulty hearing?: No Does the patient have difficulty seeing, even when wearing glasses/contacts?: Yes Does the patient have difficulty concentrating, remembering, or making decisions?: Yes Patient  able to express need for assistance with ADLs?: Yes Does the patient have difficulty dressing or bathing?: Yes Independently performs ADLs?: No Does the patient have difficulty walking or climbing stairs?: Yes Weakness of Legs: Both Weakness of Arms/Hands: Both  Permission Sought/Granted Permission sought to share information with : Case Manager, Family Supports Permission granted to share information with : Yes, Verbal Permission Granted        Permission granted to share  info w Relationship: [name], granddaughter - [phone]     Emotional Assessment Appearance:: Appears stated age Attitude/Demeanor/Rapport: Gracious Affect (typically observed): Accepting Orientation: : Oriented to Self, Oriented to Place, Oriented to  Time Alcohol / Substance Use: Not Applicable Psych Involvement: No (comment)  Admission diagnosis:  Confusion [R41.0] COPD exacerbation [J44.1] Acute on chronic respiratory failure with hypercapnia [J96.22] Acute respiratory failure with hypoxia and hypercapnia [J96.01, J96.02] Respiratory failure [J96.90] Patient Active Problem List   Diagnosis Date Noted   Respiratory failure 07/09/2022   Acute respiratory failure with hypoxia and hypercapnia 07/06/2022   COPD with acute exacerbation 07/06/2022   History of anemia due to chronic kidney disease 05/09/2022   Hypoalbuminemia due to protein-calorie malnutrition 03/15/2022   Prediabetes 03/15/2022   Syncope 02/27/2022   Hypomagnesemia 02/26/2022   Altered sensorium 08/29/2021   Chronic diastolic CHF (congestive heart failure) 08/28/2021   Episode of altered consciousness 08/27/2021   Malnutrition of moderate degree 08/30/2020   Protein-calorie malnutrition, severe 08/30/2020   Acute kidney injury superimposed on chronic kidney disease 08/29/2020   Dehydration 08/29/2020   Hyperkalemia 08/29/2020   Macrocytic anemia 08/29/2020   COVID-19 virus infection 04/07/2020   Pacemaker 12/21/2019   UTI (urinary tract infection) 05/11/2019   ARF (acute renal failure) 05/10/2019   Acute lower UTI 05/10/2019   Symptomatic bradycardia 05/01/2018   Near syncope 05/01/2018   Stage 3b chronic kidney disease (CKD) 05/01/2018   Influenza B 04/10/2018   Generalized weakness 04/10/2018   Essential hypertension 03/01/2018   Asthma with chronic obstructive pulmonary disease (COPD) 03/01/2018   Bradycardia 12/15/2017   AKI (acute kidney injury) 07/18/2017   Colostomy present 11/08/2016    Syncope and collapse 10/08/2016   Abnormal chest x-ray 10/08/2016   Lethargy 10/08/2016   Dementia    Sepsis 09/05/2016   Community acquired pneumonia 09/05/2016   Acute encephalopathy 09/05/2016   DM2 (diabetes mellitus, type 2) 04/09/2012   Nausea & vomiting 04/09/2012   Gallstones 03/15/2011   Partial SBO 03/15/2011   Colostomy hernia 03/15/2011   Hypercalcemia 02/28/2007   IRON DEFIC ANEMIA SEC DIET IRON INTAKE 02/28/2007   DISCITIS 02/28/2007   OSTEOMYELITIS 02/28/2007   CHILLS WITHOUT FEVER 02/28/2007   PCP:  Lorenda Ishihara, MD Pharmacy:   [facility] - [place], [place] - [address] [place] [address] Phone: [phone] Fax: [phone]     Social Determinants of Health (SDOH) Social History: SDOH Screenings   Food Insecurity: No Food Insecurity (07/07/2022)  Housing: Low Risk  (07/07/2022)  Transportation Needs: No Transportation Needs (07/07/2022)  Utilities: Not At Risk (07/07/2022)  Tobacco Use: Medium Risk (07/07/2022)   SDOH Interventions:     Readmission Risk Interventions    07/09/2022   10:11 AM 05/11/2022    1:49 PM  Readmission Risk Prevention Plan  Transportation Screening Complete Complete  Medication Review (RN Care Manager) Complete Complete  PCP or Specialist appointment within 3-5 days of discharge Complete Complete  HRI or Home Care Consult Complete Complete  SW Recovery Care/Counseling  Consult Complete   Palliative Care Screening Complete Not Applicable  Skilled Nursing Facility Not Applicable Not Applicable

## 2022-07-09 NOTE — Progress Notes (Signed)
Speech Language Pathology Treatment: Dysphagia  Patient Details Name: [name] MRN: [id number] DOB: [birth year] Today's Date: 07/09/2022 Time: 0921-0930 SLP Time Calculation (min) (ACUTE ONLY): 9 min  Assessment / Plan / Recommendation Clinical Impression  Pt was seen as she finished her breakfast meal, eating a fruit cup and drinking water via straw. Note that upon arrival she was lying fairly reclined in bed, likely having eaten most of her breakfast in this position as the fruit cup was the only item left. SLP provided assistance in repositioning her upright and opening container on her tray, but then pt self-fed with no overt signs of aspiration or dysphagia noted. She appeared to do well even with larger bites and mixed consistencies, which is what was of more concern during initial eval. Recommend that she remain on regular solids and thin liquids. No further SLP f/u indicated at this time, but would recommend set-up assistance prior to meals to facilitate safe positioning.    HPI HPI: 89 y.o. female presents to [facility] hospital on 07/06/2022 with near syncopal event with fall. Venous blood gas showed a pH of 7.24, pt placed on BiPAP for hypercapnic respiratory failure. CXR and CT Head both without acute changes. Previous swallow eval by SLP in June 2018 without concern for dysphagia. PMH includes asthma, CKD III, colostomy, dementia, diabetic neuropathy, HTN, PNA, symptomatic bradycardia, DMII.      SLP Plan  All goals met      Recommendations for follow up therapy are one component of a multi-disciplinary discharge planning process, led by the attending physician.  Recommendations may be updated based on patient status, additional functional criteria and insurance authorization.    Recommendations  Diet recommendations: Regular;Thin liquid Liquids provided via: Cup;Straw Medication Administration: Whole meds with puree Supervision: Patient able to self feed;Intermittent supervision to  cue for compensatory strategies (set-up assistance) Compensations: Minimize environmental distractions;Slow rate;Small sips/bites Postural Changes and/or Swallow Maneuvers: Seated upright 90 degrees                  Oral care BID     Dysphagia, unspecified (R13.10)     All goals met     Mahala Menghini., M.A. CCC-SLP Acute Rehabilitation Services Office [phone]  Secure chat preferred   07/09/2022, 9:33 AM

## 2022-08-03 NOTE — Progress Notes (Signed)
Remote pacemaker transmission.   

## 2022-10-26 ENCOUNTER — Inpatient Hospital Stay (HOSPITAL_COMMUNITY)
Admission: EM | Admit: 2022-10-26 | Discharge: 2022-10-28 | DRG: 389 | Disposition: A | Discharge status: Hospice | Attending: Internal Medicine | Admitting: Internal Medicine

## 2022-10-26 ENCOUNTER — Other Ambulatory Visit: Payer: Self-pay

## 2022-10-26 ENCOUNTER — Encounter (HOSPITAL_COMMUNITY): Payer: Self-pay | Admitting: Emergency Medicine

## 2022-10-26 DIAGNOSIS — J441 Chronic obstructive pulmonary disease with (acute) exacerbation: Secondary | ICD-10-CM | POA: Diagnosis present

## 2022-10-26 DIAGNOSIS — E1122 Type 2 diabetes mellitus with diabetic chronic kidney disease: Secondary | ICD-10-CM | POA: Diagnosis present

## 2022-10-26 DIAGNOSIS — Z5982 Transportation insecurity: Secondary | ICD-10-CM

## 2022-10-26 DIAGNOSIS — Z950 Presence of cardiac pacemaker: Secondary | ICD-10-CM

## 2022-10-26 DIAGNOSIS — E1140 Type 2 diabetes mellitus with diabetic neuropathy, unspecified: Secondary | ICD-10-CM | POA: Diagnosis not present

## 2022-10-26 DIAGNOSIS — N2889 Other specified disorders of kidney and ureter: Secondary | ICD-10-CM | POA: Diagnosis not present

## 2022-10-26 DIAGNOSIS — Z66 Do not resuscitate: Secondary | ICD-10-CM | POA: Diagnosis present

## 2022-10-26 DIAGNOSIS — F03C0 Unspecified dementia, severe, without behavioral disturbance, psychotic disturbance, mood disturbance, and anxiety: Secondary | ICD-10-CM | POA: Diagnosis not present

## 2022-10-26 DIAGNOSIS — J4489 Other specified chronic obstructive pulmonary disease: Secondary | ICD-10-CM | POA: Diagnosis not present

## 2022-10-26 DIAGNOSIS — Z933 Colostomy status: Secondary | ICD-10-CM | POA: Diagnosis not present

## 2022-10-26 DIAGNOSIS — Z7952 Long term (current) use of systemic steroids: Secondary | ICD-10-CM

## 2022-10-26 DIAGNOSIS — R627 Adult failure to thrive: Secondary | ICD-10-CM | POA: Diagnosis present

## 2022-10-26 DIAGNOSIS — K8689 Other specified diseases of pancreas: Secondary | ICD-10-CM | POA: Diagnosis not present

## 2022-10-26 DIAGNOSIS — E872 Acidosis, unspecified: Secondary | ICD-10-CM | POA: Diagnosis present

## 2022-10-26 DIAGNOSIS — Z886 Allergy status to analgesic agent status: Secondary | ICD-10-CM | POA: Diagnosis not present

## 2022-10-26 DIAGNOSIS — I5032 Chronic diastolic (congestive) heart failure: Secondary | ICD-10-CM | POA: Diagnosis not present

## 2022-10-26 DIAGNOSIS — Z4682 Encounter for fitting and adjustment of non-vascular catheter: Secondary | ICD-10-CM | POA: Diagnosis not present

## 2022-10-26 DIAGNOSIS — N1832 Chronic kidney disease, stage 3b: Secondary | ICD-10-CM | POA: Diagnosis present

## 2022-10-26 DIAGNOSIS — Z743 Need for continuous supervision: Secondary | ICD-10-CM | POA: Diagnosis not present

## 2022-10-26 DIAGNOSIS — Z981 Arthrodesis status: Secondary | ICD-10-CM | POA: Diagnosis not present

## 2022-10-26 DIAGNOSIS — Z9049 Acquired absence of other specified parts of digestive tract: Secondary | ICD-10-CM

## 2022-10-26 DIAGNOSIS — R109 Unspecified abdominal pain: Secondary | ICD-10-CM | POA: Diagnosis not present

## 2022-10-26 DIAGNOSIS — Z7189 Other specified counseling: Secondary | ICD-10-CM | POA: Diagnosis not present

## 2022-10-26 DIAGNOSIS — Z515 Encounter for palliative care: Secondary | ICD-10-CM | POA: Diagnosis not present

## 2022-10-26 DIAGNOSIS — Z823 Family history of stroke: Secondary | ICD-10-CM

## 2022-10-26 DIAGNOSIS — Z90710 Acquired absence of both cervix and uterus: Secondary | ICD-10-CM

## 2022-10-26 DIAGNOSIS — I130 Hypertensive heart and chronic kidney disease with heart failure and stage 1 through stage 4 chronic kidney disease, or unspecified chronic kidney disease: Secondary | ICD-10-CM | POA: Diagnosis present

## 2022-10-26 DIAGNOSIS — K56600 Partial intestinal obstruction, unspecified as to cause: Principal | ICD-10-CM | POA: Diagnosis present

## 2022-10-26 DIAGNOSIS — R188 Other ascites: Secondary | ICD-10-CM | POA: Diagnosis not present

## 2022-10-26 DIAGNOSIS — R1111 Vomiting without nausea: Secondary | ICD-10-CM | POA: Diagnosis not present

## 2022-10-26 DIAGNOSIS — K56609 Unspecified intestinal obstruction, unspecified as to partial versus complete obstruction: Secondary | ICD-10-CM | POA: Diagnosis not present

## 2022-10-26 DIAGNOSIS — Z87891 Personal history of nicotine dependence: Secondary | ICD-10-CM

## 2022-10-26 DIAGNOSIS — R944 Abnormal results of kidney function studies: Secondary | ICD-10-CM | POA: Diagnosis present

## 2022-10-26 DIAGNOSIS — K5669 Other partial intestinal obstruction: Secondary | ICD-10-CM | POA: Diagnosis not present

## 2022-10-26 DIAGNOSIS — R001 Bradycardia, unspecified: Secondary | ICD-10-CM | POA: Diagnosis present

## 2022-10-26 DIAGNOSIS — Z841 Family history of disorders of kidney and ureter: Secondary | ICD-10-CM | POA: Diagnosis not present

## 2022-10-26 DIAGNOSIS — Z8249 Family history of ischemic heart disease and other diseases of the circulatory system: Secondary | ICD-10-CM | POA: Diagnosis not present

## 2022-10-26 DIAGNOSIS — E440 Moderate protein-calorie malnutrition: Secondary | ICD-10-CM | POA: Diagnosis not present

## 2022-10-26 DIAGNOSIS — Z79899 Other long term (current) drug therapy: Secondary | ICD-10-CM

## 2022-10-26 DIAGNOSIS — K8020 Calculus of gallbladder without cholecystitis without obstruction: Secondary | ICD-10-CM | POA: Diagnosis not present

## 2022-10-26 DIAGNOSIS — Z833 Family history of diabetes mellitus: Secondary | ICD-10-CM

## 2022-10-26 DIAGNOSIS — Z818 Family history of other mental and behavioral disorders: Secondary | ICD-10-CM

## 2022-10-26 DIAGNOSIS — R6889 Other general symptoms and signs: Secondary | ICD-10-CM | POA: Diagnosis not present

## 2022-10-26 NOTE — ED Provider Notes (Signed)
[place] EMERGENCY DEPARTMENT AT [facility] Provider Note   CSN: [id number] Arrival date & time: 10/26/22  2214     History {Add pertinent medical, surgical, social history, OB history to HPI:1} Chief Complaint  Patient presents with   Abdominal Pain    [name] is a 89 y.o. female.  Patient is an 89 y/o female with hx of DM, HTN, bradycardia s/p PPM, HFpEF, CKD, and dementia presents to the emergency department for evaluation of vomiting.  Per EMS report, patient recently discharged from [facility].  She has not had her chronic morphine and Ativan prescriptions as her family has had transportation issues and have been unable to pick up this medication.  Apparently, the patient has been complaining of abdominal pain with some vomiting after eating.  Patient presently denies abdominal pain, though she does corroborate emesis prior to arrival.  She denies chest pain, shortness of breath.  Does not express any acute complaints at this time.  Family communicated to EMS that they would like patient to go back to [facility].  They confirmed patient to be at her mental baseline prior to transport.  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  The history is provided by the patient. No language interpreter was used.  Abdominal Pain      Home Medications Prior to Admission medications   Medication Sig Start Date End Date Taking? Authorizing Provider  acetaminophen (TYLENOL) 500 MG tablet Take 500 mg by mouth every 6 (six) hours as needed for moderate pain.    [provider]  ALBUTEROL SULFATE PO Take 2 puffs by mouth every 4 (four) hours as needed (Wheezing/SOB).    [provider]  azithromycin (ZITHROMAX) 500 MG tablet Take 1 tablet (500 mg total) by mouth daily. 07/09/22   Rhetta Mura, MD  busPIRone (BUSPAR) 5 MG tablet Take 1 tablet (5  mg total) by mouth 3 (three) times daily. 04/29/20   Medina-Vargas, Monina C, NP  donepezil (ARICEPT) 10 MG tablet Take 1 tablet (10 mg total) by mouth at bedtime. 09/05/20   Rhetta Mura, MD  memantine (NAMENDA XR) 28 MG CP24 24 hr capsule Take 1 capsule (28 mg total) by mouth every morning. 09/05/20   Rhetta Mura, MD  predniSONE (DELTASONE) 20 MG tablet Take 2 tablets (40 mg total) by mouth daily with breakfast. 07/10/22   Rhetta Mura, MD      Allergies    Aspirin    Review of Systems   Review of Systems  Unable to perform ROS: Dementia  Gastrointestinal:  Positive for abdominal pain.    Physical Exam Updated Vital Signs BP (!) 152/78 (BP Location: Right Arm)   Pulse 60   Temp 97.8 F (36.6 C) (Oral)   Resp 14   SpO2 99%   Physical Exam Vitals and nursing note reviewed.  Constitutional:      General: She is not in acute distress.    Appearance: She is well-developed. She is not diaphoretic.     Comments: Frail appearing, nontoxic.  HENT:     Head: Normocephalic and atraumatic.  Eyes:     General: No scleral icterus.    Extraocular Movements: EOM normal.     Conjunctiva/sclera: Conjunctivae normal.  Cardiovascular:     Rate and Rhythm: Normal rate and regular rhythm.     Pulses: Normal pulses.  Pulmonary:     Effort: Pulmonary effort is normal. No  respiratory distress.     Breath sounds: No stridor. No wheezing.     Comments: Respirations even and unlabored Abdominal:     General: There is no distension.     Palpations: Abdomen is soft.     Tenderness: There is no abdominal tenderness.  Musculoskeletal:        General: Normal range of motion.     Cervical back: Normal range of motion.  Skin:    General: Skin is warm and dry.     Coloration: Skin is not pale.     Findings: No erythema or rash.  Neurological:     Mental Status: She is alert.     Coordination: Coordination normal.     Comments: Alert and conversant.  Speech clear.   Answering questions appropriately and following commands.  Psychiatric:        Mood and Affect: Mood and affect normal.        Behavior: Behavior normal.     ED Results / Procedures / Treatments   Labs (all labs ordered are listed, but only abnormal results are displayed) Labs Reviewed  CBC WITH DIFFERENTIAL/PLATELET  COMPREHENSIVE METABOLIC PANEL  LIPASE, BLOOD  URINALYSIS, ROUTINE W REFLEX MICROSCOPIC    EKG None  Radiology No results found.  Procedures Procedures  {Document cardiac monitor, telemetry assessment procedure when appropriate:1}  Medications Ordered in ED Medications - No data to display  ED Course/ Medical Decision Making/ A&P Clinical Course as of 10/26/22 2250  Mon Oct 26, 2022  2243 Call placed to granddaughter, Arlana Hove, with no answer [KH]    Clinical Course User Index [KH] Antony Madura, PA-C   {   Click here for ABCD2, HEART and other calculatorsREFRESH Note before signing :1}                              Medical Decision Making Amount and/or Complexity of Data Reviewed Labs: ordered. Radiology: ordered.   ***  {Document critical care time when appropriate:1} {Document review of labs and clinical decision tools ie heart score, Chads2Vasc2 etc:1}  {Document your independent review of radiology images, and any outside records:1} {Document your discussion with family members, caretakers, and with consultants:1} {Document social determinants of health affecting pt's care:1} {Document your decision making why or why not admission, treatments were needed:1} Final Clinical Impression(s) / ED Diagnoses Final diagnoses:  None    Rx / DC Orders ED Discharge Orders     None

## 2022-10-26 NOTE — ED Notes (Signed)
Granddaughter [name] cell: [phone] would like an update asap

## 2022-10-26 NOTE — ED Triage Notes (Signed)
BIB EMS from home.  Pt recently discharged from [facility].  Apparently there was a misunderstanding and the pt's morphine and ativan prescription were not picked up after discharge.  Family has transportation issues.  Pt has had abdominal pain and some vomiting after eating.  Granddaughter reported to EMS that pt sometimes eats until she get sick.  Pt vomited on scene with EMS and now has no complaints.  Family would like pt to go back to [facility].  Hospice was contacted by EMS and reportedly were investigating this possibility.  Pt  has GCS 14 at baseline and family reports she is at her baseline tonight.

## 2022-10-27 ENCOUNTER — Emergency Department (HOSPITAL_COMMUNITY)

## 2022-10-27 ENCOUNTER — Other Ambulatory Visit (HOSPITAL_COMMUNITY): Payer: 59

## 2022-10-27 DIAGNOSIS — K56600 Partial intestinal obstruction, unspecified as to cause: Secondary | ICD-10-CM | POA: Diagnosis not present

## 2022-10-27 DIAGNOSIS — F03C0 Unspecified dementia, severe, without behavioral disturbance, psychotic disturbance, mood disturbance, and anxiety: Secondary | ICD-10-CM | POA: Diagnosis not present

## 2022-10-27 DIAGNOSIS — K5669 Other partial intestinal obstruction: Secondary | ICD-10-CM | POA: Diagnosis not present

## 2022-10-27 DIAGNOSIS — Z7189 Other specified counseling: Secondary | ICD-10-CM | POA: Diagnosis not present

## 2022-10-27 DIAGNOSIS — Z515 Encounter for palliative care: Secondary | ICD-10-CM

## 2022-10-27 MED ORDER — IPRATROPIUM-ALBUTEROL 0.5-2.5 (3) MG/3ML IN SOLN: 3.0000 mL | Freq: Four times a day (QID) | RESPIRATORY_TRACT | Status: DC | PRN

## 2022-10-27 MED ORDER — LORAZEPAM 1 MG PO TABS: 1.0000 mg | ORAL_TABLET | ORAL | Status: DC | PRN

## 2022-10-27 MED ORDER — ACETAMINOPHEN 325 MG PO TABS: 650.0000 mg | ORAL_TABLET | Freq: Four times a day (QID) | ORAL | Status: DC | PRN

## 2022-10-27 MED ORDER — SODIUM CHLORIDE 0.9 % IV SOLN: INTRAVENOUS | Status: DC

## 2022-10-27 MED ORDER — HYDRALAZINE HCL 20 MG/ML IJ SOLN: 5.0000 mg | INTRAMUSCULAR | Status: DC | PRN

## 2022-10-27 MED ORDER — PROCHLORPERAZINE EDISYLATE 10 MG/2ML IJ SOLN
10.0000 mg | INTRAMUSCULAR | Status: DC | PRN
  Administered 2022-10-27: 10 mg via INTRAVENOUS
  Filled 2022-10-27: qty 2

## 2022-10-27 MED ORDER — ACETAMINOPHEN 650 MG RE SUPP: 650.0000 mg | Freq: Four times a day (QID) | RECTAL | Status: DC | PRN

## 2022-10-27 MED ORDER — IOHEXOL 350 MG/ML SOLN
60.0000 mL | Freq: Once | INTRAVENOUS | Status: AC | PRN
  Administered 2022-10-27: 60 mL via INTRAVENOUS

## 2022-10-27 MED ORDER — HYDROMORPHONE HCL 1 MG/ML IJ SOLN
0.5000 mg | INTRAMUSCULAR | Status: DC | PRN
  Administered 2022-10-27: 0.5 mg via INTRAVENOUS
  Filled 2022-10-27: qty 1

## 2022-10-27 MED ORDER — ENOXAPARIN SODIUM 40 MG/0.4ML IJ SOSY
40.0000 mg | PREFILLED_SYRINGE | INTRAMUSCULAR | Status: DC
  Administered 2022-10-27 – 2022-10-28 (×2): 40 mg via SUBCUTANEOUS
  Filled 2022-10-27 (×2): qty 0.4

## 2022-10-27 NOTE — ED Notes (Signed)
ED TO INPATIENT HANDOFF REPORT  ED Nurse Name and Phone #: [phone]  S Name/Age/Gender [name] 89 y.o. female Room/Bed: 026C/026C  Code Status   Code Status: DNR  Home/SNF/Other Home  Is this baseline?   Triage Complete: Triage complete  Chief Complaint Partial small bowel obstruction (HCC) [K56.600]  Triage Note BIB EMS from home.  Pt recently discharged from [facility].  Apparently there was a misunderstanding and the pt's morphine and ativan prescription were not picked up after discharge.  Family has transportation issues.  Pt has had abdominal pain and some vomiting after eating.  Granddaughter reported to EMS that pt sometimes eats until she get sick.  Pt vomited on scene with EMS and now has no complaints.  Family would like pt to go back to [facility].  Hospice was contacted by EMS and reportedly were investigating this possibility.  Pt  has GCS 14 at baseline and family reports she is at her baseline tonight.    Allergies Allergies  Allergen Reactions   Aspirin Other (See Comments)    Triggers asthma    Level of Care/Admitting Diagnosis ED Disposition     ED Disposition  Admit   Condition  --   Comment  Hospital Area: [facility] [[id number]]  Level of Care: Med-Surg [16]  May place patient in observation at [facility] or [facility] if equivalent level of care is available:: Yes  Covid Evaluation: Asymptomatic - no recent exposure (last 10 days) testing not required  Diagnosis: Partial small bowel obstruction [facility]) [[id number]]  Admitting Physician: Joycelyn Das [[id number]]  Attending Physician: Joycelyn Das [[id number]]          B Medical/Surgery History Past Medical History:  Diagnosis Date   Asthma    CKD (chronic kidney disease) stage 3, GFR 30-59 ml/min (HCC) 05/01/2018   Colostomy present (HCC)    hx/notes 07/17/2010   Dementia (HCC)    Diabetic neuropathy (HCC)    Hypertension    hx/notes 07/17/2010   Pneumonia     recent/notes 10/08/2016   Symptomatic bradycardia 12/15/2017   Type II diabetes mellitus (HCC)    hx/notes 07/17/2010   Past Surgical History:  Procedure Laterality Date   ABDOMINAL HYSTERECTOMY     COLOSTOMY     S/P SBO hx/notes 07/17/2010   LUMBAR FUSION  11/2006   hx/notes 07/17/2010   NASAL SINUS SURGERY  04/28/2002   Bilateral inferior turbinate reductions, bilateral maxillary antrotomies, bilateral total ethmoidectomies, bilateral frontal recess explorations, bilateral sphenoidotomies, Instatrac guidance./notes 07/28/2010   PACEMAKER IMPLANT N/A 05/02/2018   Procedure: PACEMAKER IMPLANT;  Surgeon: Marinus Maw, MD;  Location: [facility] INVASIVE CV LAB;  Service: Cardiovascular;  Laterality: N/A;     A IV Location/Drains/Wounds Patient Lines/Drains/Airways Status     Active Line/Drains/Airways     Name Placement date Placement time Site Days   Peripheral IV 10/27/22 20 G 1.88" Left Forearm 10/27/22  0101  Forearm  less than 1   NG/OG Vented/Dual Lumen 16 Fr. Right nare 10/27/22  0815  Right nare  less than 1   Colostomy LLQ --  --  LLQ  --   Wound / Incision (Open or Dehisced) 07/09/22 Other (Comment) Toe (Comment  which one) Anterior;Right Small black area on toe. 07/09/22  0825  Toe (Comment  which one)  110            Intake/Output Last 24 hours  Intake/Output Summary (Last 24 hours) at 10/27/2022 0932 Last data filed at  10/27/2022 0815 Gross per 24 hour  Intake --  Output 480 ml  Net -480 ml    Labs/Imaging Results for orders placed or performed during the hospital encounter of 10/26/22 (from the past 48 hour(s))  CBC with Differential     Status: Abnormal   Collection Time: 10/27/22  1:02 AM  Result Value Ref Range   WBC 8.2 4.0 - 10.5 K/uL   RBC 3.64 (L) 3.87 - 5.11 MIL/uL   Hemoglobin 10.9 (L) 12.0 - 15.0 g/dL   HCT 40.9 (L) 81.1 - 91.4 %   MCV 96.2 80.0 - 100.0 fL   MCH 29.9 26.0 - 34.0 pg   MCHC 31.1 30.0 - 36.0 g/dL   RDW 78.2 95.6 - 21.3 %   Platelets 150  150 - 400 K/uL    Comment: REPEATED TO VERIFY   nRBC 0.0 0.0 - 0.2 %   Neutrophils Relative % 82 %   Neutro Abs 6.8 1.7 - 7.7 K/uL   Lymphocytes Relative 12 %   Lymphs Abs 1.0 0.7 - 4.0 K/uL   Monocytes Relative 4 %   Monocytes Absolute 0.4 0.1 - 1.0 K/uL   Eosinophils Relative 1 %   Eosinophils Absolute 0.1 0.0 - 0.5 K/uL   Basophils Relative 1 %   Basophils Absolute 0.0 0.0 - 0.1 K/uL   Immature Granulocytes 0 %   Abs Immature Granulocytes 0.02 0.00 - 0.07 K/uL    Comment: Performed at [facility], [address] [place], [place] [address]  Comprehensive metabolic panel     Status: Abnormal   Collection Time: 10/27/22  1:02 AM  Result Value Ref Range   Sodium 136 135 - 145 mmol/L   Potassium 4.2 3.5 - 5.1 mmol/L   Chloride 104 98 - 111 mmol/L   CO2 21 (L) 22 - 32 mmol/L   Glucose, Bld 147 (H) 70 - 99 mg/dL    Comment: Glucose reference range applies only to samples taken after fasting for at least 8 hours.   BUN 44 (H) 8 - 23 mg/dL   Creatinine, Ser 8.46 (H) 0.44 - 1.00 mg/dL   Calcium 8.9 8.9 - 96.2 mg/dL   Total Protein 6.7 6.5 - 8.1 g/dL   Albumin 3.2 (L) 3.5 - 5.0 g/dL   AST 19 15 - 41 U/L   ALT 12 0 - 44 U/L   Alkaline Phosphatase 56 38 - 126 U/L   Total Bilirubin 0.4 0.3 - 1.2 mg/dL   GFR, Estimated 35 (L) >60 mL/min    Comment: (NOTE) Calculated using the CKD-EPI Creatinine Equation (2021)    Anion gap 11 5 - 15    Comment: Performed at [facility], [address] [place], [place] [address]  Lipase, blood     Status: Abnormal   Collection Time: 10/27/22  1:02 AM  Result Value Ref Range   Lipase 115 (H) 11 - 51 U/L    Comment: Performed at [facility], [address], [place], [place] [address]  Urinalysis, Routine w reflex microscopic -Urine, Clean Catch     Status: Abnormal   Collection Time: 10/27/22  3:40 AM  Result Value Ref Range   Color, Urine YELLOW YELLOW   APPearance CLEAR CLEAR   Specific Gravity, Urine 1.021 1.005 - 1.030    pH 5.0 5.0 - 8.0   Glucose, UA NEGATIVE NEGATIVE mg/dL   Hgb urine dipstick SMALL (A) NEGATIVE   Bilirubin Urine NEGATIVE NEGATIVE   Ketones, ur  NEGATIVE NEGATIVE mg/dL   Protein, ur NEGATIVE NEGATIVE mg/dL   Nitrite NEGATIVE NEGATIVE   Leukocytes,Ua NEGATIVE NEGATIVE   RBC / HPF 0-5 0 - 5 RBC/hpf   WBC, UA 0-5 0 - 5 WBC/hpf   Bacteria, UA NONE SEEN NONE SEEN   Squamous Epithelial / HPF 0-5 0 - 5 /HPF   Mucus PRESENT     Comment: Performed at [facility], [address] [place], [place] [address]   DG Abdomen 1 View  Result Date: 10/27/2022 CLINICAL DATA:  Nasogastric tube placement EXAM: ABDOMEN - 1 VIEW COMPARISON:  Abdominal CT from earlier today FINDINGS: Enteric tube loops through the esophagus with side port seen over the mid esophagus and tip not covered. There is a dilated loop of bowel in the central abdomen, known from prior CT. Stable lung bases with dual-chamber pacer leads. Prelim sent to ER staff and RN via epic chat. IMPRESSION: Malpositioned enteric tube which loops in the esophagus. Electronically Signed   By: Tiburcio Pea M.D.   On: 10/27/2022 06:12   CT ABDOMEN PELVIS W CONTRAST  Result Date: 10/27/2022 CLINICAL DATA:  Acute abdominal pain EXAM: CT ABDOMEN AND PELVIS WITH CONTRAST TECHNIQUE: Multidetector CT imaging of the abdomen and pelvis was performed using the standard protocol following bolus administration of intravenous contrast. RADIATION DOSE REDUCTION: This exam was performed according to the departmental dose-optimization program which includes automated exposure control, adjustment of the mA and/or kV according to patient size and/or use of iterative reconstruction technique. CONTRAST:  60mL OMNIPAQUE IOHEXOL 350 MG/ML SOLN COMPARISON:  12/10/2020 FINDINGS: Lower chest: Small right pleural effusion. Mild bibasilar atelectasis. Hepatobiliary: Liver is within normal limits. Layering small gallstones (series 3/image 23), without associated inflammatory  changes. No intrahepatic or extrahepatic duct dilatation. Pancreas: Mildly prominent pancreatic duct, measuring up to 6 mm in the pancreatic head, although extending to the ampulla without obstructing lesion. This appearance is similar to the prior when accounting for differences in technique. No parenchymal mass or atrophy is seen. Spleen: Within normal limits. Adrenals/Urinary Tract: Adrenal glands are within normal limits. 7 mm right upper pole simple cyst (series 8/image 10), benign (Bosniak I). No follow-up is recommended. 5 mm parenchymal calcification along the left upper kidney (series 3/image 17). No hydronephrosis. Bladder is within normal limits. Stomach/Bowel: Stomach is within normal limits. Multiple dilated loops of small bowel in the central abdomen, with transition in the lower pelvis (coronal image 22), suggesting at least partial small bowel obstruction. Appendix is not discretely visualized. Status post left hemicolectomy with left mid abdominal colostomy and Hartman's pouch. Vascular/Lymphatic: No evidence of abdominal aortic aneurysm. Atherosclerotic calcifications of the abdominal aorta and branch vessels, although vessels remain patent. No suspicious abdominopelvic lymphadenopathy. Reproductive: Status post hysterectomy. No adnexal masses. Other: Small volume pelvic ascites. Musculoskeletal: Status post PLIF at L3-5. Degenerative changes of the visualized thoracolumbar spine. IMPRESSION: Dilated loops of small bowel, suggesting at least partial small bowel obstruction, with transition in the lower pelvis. Cholelithiasis, without associated inflammatory changes. Small right pleural effusion. Small volume pelvic ascites. Electronically Signed   By: Charline Bills M.D.   On: 10/27/2022 02:02    Pending Labs Unresulted Labs (From admission, onward)     Start     Ordered   11/03/22 0500  Creatinine, serum  (enoxaparin (LOVENOX)    CrCl >/= 30 ml/min)  Weekly,   R     Comments: while on  enoxaparin therapy    10/27/22 0915   10/27/22 0915  CBC  (enoxaparin (LOVENOX)    CrCl >/= 30 ml/min)  Once,   R       Comments: Baseline for enoxaparin therapy IF NOT ALREADY DRAWN.  Notify MD if PLT < 100 K.    10/27/22 0915   10/27/22 0915  Creatinine, serum  (enoxaparin (LOVENOX)    CrCl >/= 30 ml/min)  Once,   R       Comments: Baseline for enoxaparin therapy IF NOT ALREADY DRAWN.    10/27/22 0915            Vitals/Pain Today's Vitals   10/27/22 0630 10/27/22 0700 10/27/22 0716 10/27/22 0723  BP: (!) 154/81 (!) 132/117    Pulse: 60 65    Resp:  12    Temp:   98.7 F (37.1 C)   TempSrc:   Oral   SpO2: 97% 94%    PainSc:    7     Isolation Precautions No active isolations  Medications Medications  hydrALAZINE (APRESOLINE) injection 5 mg (has no administration in time range)  0.9 %  sodium chloride infusion ( Intravenous New Bag/Given 10/27/22 0724)  HYDROmorphone (DILAUDID) injection 0.5 mg (0.5 mg Intravenous Given 10/27/22 0723)  prochlorperazine (COMPAZINE) injection 10 mg (10 mg Intravenous Given 10/27/22 0722)  enoxaparin (LOVENOX) injection 40 mg (has no administration in time range)  acetaminophen (TYLENOL) tablet 650 mg (has no administration in time range)    Or  acetaminophen (TYLENOL) suppository 650 mg (has no administration in time range)  iohexol (OMNIPAQUE) 350 MG/ML injection 60 mL (60 mLs Intravenous Contrast Given 10/27/22 0154)    Mobility walks with device     Focused Assessments    R Recommendations: See Admitting Provider Note  Report given to:   Additional Notes: pt pulled out NG tube twice. MD aware. Ng order dc'ed. Pt is confused. Sitter order is in. Pt is able to ambulate to the restroom w a walker and 1 assist.

## 2022-10-27 NOTE — Consult Note (Signed)
[facility] Surgery Consult Note  [name] [birth year]  [id number].    Requesting MD: Antony Madura PA Chief Complaint/Reason for Consult: SBO  HPI:  [name] is a 89 y.o. female PMH dementia, symptomatic bradycardia s/p PPM, HFpEF, diabetes, CKD stage 3b, asthma, failure to thrive on home hospice who was brought to the ED last night due to abdominal pain and n/v. No family at bedside. Patient is not a great historian. I attempted to call her granddaughter but was unable to reach her. Per chart patient was complaining of abdominal pain after eating yesterday. She vomited multiple times. She has a colostomy in place with some stool in the pouch.  Worked up by EDP and found to have dilated loops of small bowel on CT scan, suggesting at least partial small bowel obstruction with transition in the lower pelvis.  NG tube has been ordered. General surgery asked to see.  Abdominal surgical history: abdominal hysterectomy, partial colectomy/colostomy Anticoagulants: none   Family History  Problem Relation Age of Onset   Kidney disease Mother    Hypertension Mother    Other Father        gangrene with amputation   Diabetes Sister    Cancer Brother    Heart disease Brother    Schizophrenia Daughter    Diabetes Daughter    Stroke Daughter    Diabetes Sister    Breast cancer Neg Hx     Past Medical History:  Diagnosis Date   Asthma    CKD (chronic kidney disease) stage 3, GFR 30-59 ml/min (HCC) 05/01/2018   Colostomy present (HCC)    hx/notes 07/17/2010   Dementia (HCC)    Diabetic neuropathy (HCC)    Hypertension    hx/notes 07/17/2010   Pneumonia    recent/notes 10/08/2016   Symptomatic bradycardia 12/15/2017   Type II diabetes mellitus (HCC)    hx/notes 07/17/2010    Past Surgical History:  Procedure Laterality Date   ABDOMINAL HYSTERECTOMY     COLOSTOMY     S/P SBO hx/notes 07/17/2010   LUMBAR FUSION  11/2006   hx/notes 07/17/2010   NASAL SINUS SURGERY  04/28/2002    Bilateral inferior turbinate reductions, bilateral maxillary antrotomies, bilateral total ethmoidectomies, bilateral frontal recess explorations, bilateral sphenoidotomies, Instatrac guidance./notes 07/28/2010   PACEMAKER IMPLANT N/A 05/02/2018   Procedure: PACEMAKER IMPLANT;  Surgeon: Marinus Maw, MD;  Location: [facility] INVASIVE CV LAB;  Service: Cardiovascular;  Laterality: N/A;    Social History:  reports that she has never smoked. She quit smokeless tobacco use about 5 years ago.  Her smokeless tobacco use included chew. She reports that she does not drink alcohol and does not use drugs.  Allergies:  Allergies  Allergen Reactions   Aspirin Other (See Comments)    Triggers asthma    (Not in a hospital admission)   Prior to Admission medications   Medication Sig Start Date End Date Taking? Authorizing Provider  acetaminophen (TYLENOL) 500 MG tablet Take 500 mg by mouth every 6 (six) hours as needed for moderate pain.    [provider]  ALBUTEROL SULFATE PO Take 2 puffs by mouth every 4 (four) hours as needed (Wheezing/SOB).    [provider]  azithromycin (ZITHROMAX) 500 MG tablet Take 1 tablet (500 mg total) by mouth daily. 07/09/22   Rhetta Mura, MD  busPIRone (BUSPAR) 5 MG tablet Take 1 tablet (5 mg total) by mouth 3 (three) times daily. 04/29/20   Medina-Vargas, Margit Banda, NP  donepezil (ARICEPT) 10 MG tablet Take 1 tablet (10 mg total) by mouth at bedtime. 09/05/20   Rhetta Mura, MD  memantine (NAMENDA XR) 28 MG CP24 24 hr capsule Take 1 capsule (28 mg total) by mouth every morning. 09/05/20   Rhetta Mura, MD  predniSONE (DELTASONE) 20 MG tablet Take 2 tablets (40 mg total) by mouth daily with breakfast. 07/10/22   Rhetta Mura, MD    Blood pressure (!) 132/117, pulse 65, temperature 98.7 F (37.1 C), temperature source Oral, resp. rate 12, SpO2 94%. Physical Exam: General: frail, elderly female who is laying in bed in NAD HEENT:  head is normocephalic, atraumatic.  Sclera are noninjected.  Pupils equal and round.  Ears and nose without any masses or lesions.  Mouth is pink and moist. Dentition fair Heart: regular, rate, and rhythm Lungs: CTAB, no wheezes, rhonchi, or rales noted.  Respiratory effort nonlabored on room air Abd: soft, NT/ND, +BS, no masses, hernias, or organomegaly. LLQ colostomy present with stool in pouch MS: trace BLE edema, calves soft and nontender Psych: oriented to self  Results for orders placed or performed during the hospital encounter of 10/26/22 (from the past 48 hour(s))  CBC with Differential     Status: Abnormal   Collection Time: 10/27/22  1:02 AM  Result Value Ref Range   WBC 8.2 4.0 - 10.5 K/uL   RBC 3.64 (L) 3.87 - 5.11 MIL/uL   Hemoglobin 10.9 (L) 12.0 - 15.0 g/dL   HCT 16.1 (L) 09.6 - 04.5 %   MCV 96.2 80.0 - 100.0 fL   MCH 29.9 26.0 - 34.0 pg   MCHC 31.1 30.0 - 36.0 g/dL   RDW 40.9 81.1 - 91.4 %   Platelets 150 150 - 400 K/uL    Comment: REPEATED TO VERIFY   nRBC 0.0 0.0 - 0.2 %   Neutrophils Relative % 82 %   Neutro Abs 6.8 1.7 - 7.7 K/uL   Lymphocytes Relative 12 %   Lymphs Abs 1.0 0.7 - 4.0 K/uL   Monocytes Relative 4 %   Monocytes Absolute 0.4 0.1 - 1.0 K/uL   Eosinophils Relative 1 %   Eosinophils Absolute 0.1 0.0 - 0.5 K/uL   Basophils Relative 1 %   Basophils Absolute 0.0 0.0 - 0.1 K/uL   Immature Granulocytes 0 %   Abs Immature Granulocytes 0.02 0.00 - 0.07 K/uL    Comment: Performed at [facility], [address], [place], [place] [address]  Comprehensive metabolic panel     Status: Abnormal   Collection Time: 10/27/22  1:02 AM  Result Value Ref Range   Sodium 136 135 - 145 mmol/L   Potassium 4.2 3.5 - 5.1 mmol/L   Chloride 104 98 - 111 mmol/L   CO2 21 (L) 22 - 32 mmol/L   Glucose, Bld 147 (H) 70 - 99 mg/dL    Comment: Glucose reference range applies only to samples taken after fasting for at least 8 hours.   BUN 44 (H) 8 - 23 mg/dL    Creatinine, Ser 6.21 (H) 0.44 - 1.00 mg/dL   Calcium 8.9 8.9 - 30.8 mg/dL   Total Protein 6.7 6.5 - 8.1 g/dL   Albumin 3.2 (L) 3.5 - 5.0 g/dL   AST 19 15 - 41 U/L   ALT 12 0 - 44 U/L   Alkaline Phosphatase 56 38 - 126 U/L   Total Bilirubin 0.4 0.3 - 1.2 mg/dL   GFR, Estimated 35 (L) >60 mL/min  Comment: (NOTE) Calculated using the CKD-EPI Creatinine Equation (2021)    Anion gap 11 5 - 15    Comment: Performed at [facility], [address], [place], [place] [address]  Lipase, blood     Status: Abnormal   Collection Time: 10/27/22  1:02 AM  Result Value Ref Range   Lipase 115 (H) 11 - 51 U/L    Comment: Performed at [facility], [address] [place], [place] [address]  Urinalysis, Routine w reflex microscopic -Urine, Clean Catch     Status: Abnormal   Collection Time: 10/27/22  3:40 AM  Result Value Ref Range   Color, Urine YELLOW YELLOW   APPearance CLEAR CLEAR   Specific Gravity, Urine 1.021 1.005 - 1.030   pH 5.0 5.0 - 8.0   Glucose, UA NEGATIVE NEGATIVE mg/dL   Hgb urine dipstick SMALL (A) NEGATIVE   Bilirubin Urine NEGATIVE NEGATIVE   Ketones, ur NEGATIVE NEGATIVE mg/dL   Protein, ur NEGATIVE NEGATIVE mg/dL   Nitrite NEGATIVE NEGATIVE   Leukocytes,Ua NEGATIVE NEGATIVE   RBC / HPF 0-5 0 - 5 RBC/hpf   WBC, UA 0-5 0 - 5 WBC/hpf   Bacteria, UA NONE SEEN NONE SEEN   Squamous Epithelial / HPF 0-5 0 - 5 /HPF   Mucus PRESENT     Comment: Performed at [facility], [address], [place], [place] [address]   DG Abdomen 1 View  Result Date: 10/27/2022 CLINICAL DATA:  Nasogastric tube placement EXAM: ABDOMEN - 1 VIEW COMPARISON:  Abdominal CT from earlier today FINDINGS: Enteric tube loops through the esophagus with side port seen over the mid esophagus and tip not covered. There is a dilated loop of bowel in the central abdomen, known from prior CT. Stable lung bases with dual-chamber pacer leads. Prelim sent to ER staff and RN via epic chat. IMPRESSION:  Malpositioned enteric tube which loops in the esophagus. Electronically Signed   By: Tiburcio Pea M.D.   On: 10/27/2022 06:12   CT ABDOMEN PELVIS W CONTRAST  Result Date: 10/27/2022 CLINICAL DATA:  Acute abdominal pain EXAM: CT ABDOMEN AND PELVIS WITH CONTRAST TECHNIQUE: Multidetector CT imaging of the abdomen and pelvis was performed using the standard protocol following bolus administration of intravenous contrast. RADIATION DOSE REDUCTION: This exam was performed according to the departmental dose-optimization program which includes automated exposure control, adjustment of the mA and/or kV according to patient size and/or use of iterative reconstruction technique. CONTRAST:  60mL OMNIPAQUE IOHEXOL 350 MG/ML SOLN COMPARISON:  12/10/2020 FINDINGS: Lower chest: Small right pleural effusion. Mild bibasilar atelectasis. Hepatobiliary: Liver is within normal limits. Layering small gallstones (series 3/image 23), without associated inflammatory changes. No intrahepatic or extrahepatic duct dilatation. Pancreas: Mildly prominent pancreatic duct, measuring up to 6 mm in the pancreatic head, although extending to the ampulla without obstructing lesion. This appearance is similar to the prior when accounting for differences in technique. No parenchymal mass or atrophy is seen. Spleen: Within normal limits. Adrenals/Urinary Tract: Adrenal glands are within normal limits. 7 mm right upper pole simple cyst (series 8/image 10), benign (Bosniak I). No follow-up is recommended. 5 mm parenchymal calcification along the left upper kidney (series 3/image 17). No hydronephrosis. Bladder is within normal limits. Stomach/Bowel: Stomach is within normal limits. Multiple dilated loops of small bowel in the central abdomen, with transition in the lower pelvis (coronal image 22), suggesting at least partial small bowel obstruction. Appendix is not discretely visualized. Status post left hemicolectomy with  left mid abdominal  colostomy and Hartman's pouch. Vascular/Lymphatic: No evidence of abdominal aortic aneurysm. Atherosclerotic calcifications of the abdominal aorta and branch vessels, although vessels remain patent. No suspicious abdominopelvic lymphadenopathy. Reproductive: Status post hysterectomy. No adnexal masses. Other: Small volume pelvic ascites. Musculoskeletal: Status post PLIF at L3-5. Degenerative changes of the visualized thoracolumbar spine. IMPRESSION: Dilated loops of small bowel, suggesting at least partial small bowel obstruction, with transition in the lower pelvis. Cholelithiasis, without associated inflammatory changes. Small right pleural effusion. Small volume pelvic ascites. Electronically Signed   By: Charline Bills M.D.   On: 10/27/2022 02:02      Assessment/Plan Partial SBO - This is an elderly patient with multiple medical problems on hospice that comes into today with a partial SBO. She is not a great historian. I attempted to call family but was unable to reach her granddaughter. She is nontoxic appearing and CT scan shows dilated loops of small bowel, suggesting at least partial small bowel obstruction, with transition in the lower pelvis. She is not a great surgical candidate and I would not recommend surgery for this patient. Hopefully her SBO will resolve with conservative management. NG has been ordered. After a period of time on LIWS will start SBO protocol.  ID - none indicated VTE - SCDs, ok for chemical dvt ppx from surgical standpoint FEN - IVF, NPO/NGT to LIWS Foley - none  Dementia Symptomatic bradycardia s/p PPM HFpEF Diabetes CKD stage 3b Asthma DNR, on home hospice  I reviewed ED provider notes, last 24 h vitals and pain scores, last 48 h intake and output, last 24 h labs and trends, and last 24 h imaging results.   Franne Forts, PA-C [facility] 10/27/2022, 8:37 AM Please see Amion for pager number during day hours 7:00am-4:30pm

## 2022-10-27 NOTE — ED Notes (Signed)
ED TO INPATIENT HANDOFF REPORT  ED Nurse Name and Phone #: Sheilah Mins [phone]  S Name/Age/Gender Girard Cooter 89 y.o. female Room/Bed: 026C/026C  Code Status   Code Status: Prior  Home/SNF/Other Home Patient oriented to: self, place, time, and situation Is this baseline? Yes   Triage Complete: Triage complete  Chief Complaint abd pain; nausea  Triage Note BIB EMS from home.  Pt recently discharged from [facility].  Apparently there was a misunderstanding and the pt's morphine and ativan prescription were not picked up after discharge.  Family has transportation issues.  Pt has had abdominal pain and some vomiting after eating.  Granddaughter reported to EMS that pt sometimes eats until she get sick.  Pt vomited on scene with EMS and now has no complaints.  Family would like pt to go back to [facility].  Hospice was contacted by EMS and reportedly were investigating this possibility.  Pt  has GCS 14 at baseline and family reports she is at her baseline tonight.    Allergies Allergies  Allergen Reactions   Aspirin Other (See Comments)    Triggers asthma    Level of Care/Admitting Diagnosis ED Disposition     ED Disposition  Admit   Condition  --   Comment  The patient appears reasonably stabilized for admission considering the current resources, flow, and capabilities available in the ED at this time, and I doubt any other [facility] requiring further screening and/or treatment in the ED prior to admission is  present.          B Medical/Surgery History Past Medical History:  Diagnosis Date   Asthma    CKD (chronic kidney disease) stage 3, GFR 30-59 ml/min (HCC) 05/01/2018   Colostomy present (HCC)    hx/notes 07/17/2010   Dementia (HCC)    Diabetic neuropathy (HCC)    Hypertension    hx/notes 07/17/2010   Pneumonia    recent/notes 10/08/2016   Symptomatic bradycardia 12/15/2017   Type II diabetes mellitus (HCC)    hx/notes 07/17/2010   Past Surgical History:   Procedure Laterality Date   ABDOMINAL HYSTERECTOMY     COLOSTOMY     S/P SBO hx/notes 07/17/2010   LUMBAR FUSION  11/2006   hx/notes 07/17/2010   NASAL SINUS SURGERY  04/28/2002   Bilateral inferior turbinate reductions, bilateral maxillary antrotomies, bilateral total ethmoidectomies, bilateral frontal recess explorations, bilateral sphenoidotomies, Instatrac guidance./notes 07/28/2010   PACEMAKER IMPLANT N/A 05/02/2018   Procedure: PACEMAKER IMPLANT;  Surgeon: Marinus Maw, MD;  Location: [facility] INVASIVE CV LAB;  Service: Cardiovascular;  Laterality: N/A;     A IV Location/Drains/Wounds Patient Lines/Drains/Airways Status     Active Line/Drains/Airways     Name Placement date Placement time Site Days   Peripheral IV 10/27/22 20 G 1.88" Left Forearm 10/27/22  0101  Forearm  less than 1   Colostomy LLQ --  --  LLQ  --   Wound / Incision (Open or Dehisced) 07/09/22 Other (Comment) Toe (Comment  which one) Anterior;Right Small black area on toe. 07/09/22  0825  Toe (Comment  which one)  110            Intake/Output Last 24 hours No intake or output data in the 24 hours ending 10/27/22 0744  Labs/Imaging Results for orders placed or performed during the hospital encounter of 10/26/22 (from the past 48 hour(s))  CBC with Differential     Status: Abnormal   Collection Time: 10/27/22  1:02 AM  Result Value  Ref Range   WBC 8.2 4.0 - 10.5 K/uL   RBC 3.64 (L) 3.87 - 5.11 MIL/uL   Hemoglobin 10.9 (L) 12.0 - 15.0 g/dL   HCT 65.7 (L) 84.6 - 96.2 %   MCV 96.2 80.0 - 100.0 fL   MCH 29.9 26.0 - 34.0 pg   MCHC 31.1 30.0 - 36.0 g/dL   RDW 95.2 84.1 - 32.4 %   Platelets 150 150 - 400 K/uL    Comment: REPEATED TO VERIFY   nRBC 0.0 0.0 - 0.2 %   Neutrophils Relative % 82 %   Neutro Abs 6.8 1.7 - 7.7 K/uL   Lymphocytes Relative 12 %   Lymphs Abs 1.0 0.7 - 4.0 K/uL   Monocytes Relative 4 %   Monocytes Absolute 0.4 0.1 - 1.0 K/uL   Eosinophils Relative 1 %   Eosinophils Absolute 0.1 0.0 -  0.5 K/uL   Basophils Relative 1 %   Basophils Absolute 0.0 0.0 - 0.1 K/uL   Immature Granulocytes 0 %   Abs Immature Granulocytes 0.02 0.00 - 0.07 K/uL    Comment: Performed at [facility], [address], [place], [place] [address]  Comprehensive metabolic panel     Status: Abnormal   Collection Time: 10/27/22  1:02 AM  Result Value Ref Range   Sodium 136 135 - 145 mmol/L   Potassium 4.2 3.5 - 5.1 mmol/L   Chloride 104 98 - 111 mmol/L   CO2 21 (L) 22 - 32 mmol/L   Glucose, Bld 147 (H) 70 - 99 mg/dL    Comment: Glucose reference range applies only to samples taken after fasting for at least 8 hours.   BUN 44 (H) 8 - 23 mg/dL   Creatinine, Ser 7.25 (H) 0.44 - 1.00 mg/dL   Calcium 8.9 8.9 - 36.6 mg/dL   Total Protein 6.7 6.5 - 8.1 g/dL   Albumin 3.2 (L) 3.5 - 5.0 g/dL   AST 19 15 - 41 U/L   ALT 12 0 - 44 U/L   Alkaline Phosphatase 56 38 - 126 U/L   Total Bilirubin 0.4 0.3 - 1.2 mg/dL   GFR, Estimated 35 (L) >60 mL/min    Comment: (NOTE) Calculated using the CKD-EPI Creatinine Equation (2021)    Anion gap 11 5 - 15    Comment: Performed at [facility], [address] [place], [place] [address]  Lipase, blood     Status: Abnormal   Collection Time: 10/27/22  1:02 AM  Result Value Ref Range   Lipase 115 (H) 11 - 51 U/L    Comment: Performed at [facility], [address] [place], [place] [address]  Urinalysis, Routine w reflex microscopic -Urine, Clean Catch     Status: Abnormal   Collection Time: 10/27/22  3:40 AM  Result Value Ref Range   Color, Urine YELLOW YELLOW   APPearance CLEAR CLEAR   Specific Gravity, Urine 1.021 1.005 - 1.030   pH 5.0 5.0 - 8.0   Glucose, UA NEGATIVE NEGATIVE mg/dL   Hgb urine dipstick SMALL (A) NEGATIVE   Bilirubin Urine NEGATIVE NEGATIVE   Ketones, ur NEGATIVE NEGATIVE mg/dL   Protein, ur NEGATIVE NEGATIVE mg/dL   Nitrite NEGATIVE NEGATIVE   Leukocytes,Ua NEGATIVE NEGATIVE   RBC / HPF 0-5 0 - 5 RBC/hpf   WBC, UA 0-5 0 -  5 WBC/hpf   Bacteria, UA NONE SEEN NONE SEEN   Squamous Epithelial / HPF 0-5 0 - 5 /HPF  Mucus PRESENT     Comment: Performed at [facility], [address], [place], [place] [address]   DG Abdomen 1 View  Result Date: 10/27/2022 CLINICAL DATA:  Nasogastric tube placement EXAM: ABDOMEN - 1 VIEW COMPARISON:  Abdominal CT from earlier today FINDINGS: Enteric tube loops through the esophagus with side port seen over the mid esophagus and tip not covered. There is a dilated loop of bowel in the central abdomen, known from prior CT. Stable lung bases with dual-chamber pacer leads. Prelim sent to ER staff and RN via epic chat. IMPRESSION: Malpositioned enteric tube which loops in the esophagus. Electronically Signed   By: Tiburcio Pea M.D.   On: 10/27/2022 06:12   CT ABDOMEN PELVIS W CONTRAST  Result Date: 10/27/2022 CLINICAL DATA:  Acute abdominal pain EXAM: CT ABDOMEN AND PELVIS WITH CONTRAST TECHNIQUE: Multidetector CT imaging of the abdomen and pelvis was performed using the standard protocol following bolus administration of intravenous contrast. RADIATION DOSE REDUCTION: This exam was performed according to the departmental dose-optimization program which includes automated exposure control, adjustment of the  and/or kV according to patient size and/or use of iterative reconstruction technique. CONTRAST:  60mL OMNIPAQUE IOHEXOL 350 MG/ML SOLN COMPARISON:  12/10/2020 FINDINGS: Lower chest: Small right pleural effusion. Mild bibasilar atelectasis. Hepatobiliary: Liver is within normal limits. Layering small gallstones (series 3/image 23), without associated inflammatory changes. No intrahepatic or extrahepatic duct dilatation. Pancreas: Mildly prominent pancreatic duct, measuring up to 6 mm in the pancreatic head, although extending to the ampulla without obstructing lesion. This appearance is similar to the prior when accounting for differences in technique. No parenchymal mass or atrophy is  seen. Spleen: Within normal limits. Adrenals/Urinary Tract: Adrenal glands are within normal limits. 7 mm right upper pole simple cyst (series 8/image 10), benign (Bosniak I). No follow-up is recommended. 5 mm parenchymal calcification along the left upper kidney (series 3/image 17). No hydronephrosis. Bladder is within normal limits. Stomach/Bowel: Stomach is within normal limits. Multiple dilated loops of small bowel in the central abdomen, with transition in the lower pelvis (coronal image 22), suggesting at least partial small bowel obstruction. Appendix is not discretely visualized. Status post left hemicolectomy with left mid abdominal colostomy and Hartman's pouch. Vascular/Lymphatic: No evidence of abdominal aortic aneurysm. Atherosclerotic calcifications of the abdominal aorta and branch vessels, although vessels remain patent. No suspicious abdominopelvic lymphadenopathy. Reproductive: Status post hysterectomy. No adnexal masses. Other: Small volume pelvic ascites. Musculoskeletal: Status post PLIF at L3-5. Degenerative changes of the visualized thoracolumbar spine. IMPRESSION: Dilated loops of small bowel, suggesting at least partial small bowel obstruction, with transition in the lower pelvis. Cholelithiasis, without associated inflammatory changes. Small right pleural effusion. Small volume pelvic ascites. Electronically Signed   By: Charline Bills M.D.   On: 10/27/2022 02:02    Pending Labs Unresulted Labs (From admission, onward)    None       Vitals/Pain Today's Vitals   10/27/22 0630 10/27/22 0700 10/27/22 0716 10/27/22 0723  BP: (!) 154/81 (!) 132/117    Pulse: 60 65    Resp:  12    Temp:   98.7 F (37.1 C)   TempSrc:   Oral   SpO2: 97% 94%    PainSc:    7     Isolation Precautions No active isolations  Medications Medications  hydrALAZINE (APRESOLINE) injection 5 mg (has no administration in time range)  0.9 %  sodium chloride infusion ( Intravenous New Bag/Given  10/27/22 0724)  HYDROmorphone (DILAUDID)  injection 0.5 mg (0.5 mg Intravenous Given 10/27/22 0723)  prochlorperazine (COMPAZINE) injection 10 mg (10 mg Intravenous Given 10/27/22 0722)  iohexol (OMNIPAQUE) 350 MG/ML injection 60 mL (60 mLs Intravenous Contrast Given 10/27/22 0154)    Mobility walks     Focused Assessments GI assessment   R Recommendations: See Admitting Provider Note  Report given to:   Additional Notes:

## 2022-10-27 NOTE — ED Notes (Signed)
NG tube insertion attempted.  Difficult to insert; pt did not tolerate well.  KUB to be ordered for potential placement to see if tube needs to be advanced further or if coiled.

## 2022-10-27 NOTE — Care Management (Signed)
Per chart review, patient is from home w [facility], reached out to liaison, Thea Gist RN, who was notified by PMT of admission, they are working on getting her a bed at [facility].

## 2022-10-27 NOTE — ED Notes (Signed)
NG tube coiled per KUB read.

## 2022-10-27 NOTE — ED Notes (Addendum)
Pt removed NG tube prior to this RN being able to re-attempt placement.

## 2022-10-27 NOTE — ED Notes (Addendum)
Gen surg, Brooke, PA notified of NGT placement. Waiting for xray to confirm placement. Output of noted upon placement. Will continue to monitor.

## 2022-10-27 NOTE — ED Notes (Signed)
Pt was found in room by this RN with NGT on the floor. Pt denies pulling out the NGT. Reports abdominal pain and mild nausea at this time. Will notify MD.

## 2022-10-27 NOTE — Consult Note (Signed)
Palliative Care Consult Note                                  Date: 10/27/2022   Patient Name: [name]  DOB: Jan 22, 1934  MRN: [id number]  Age / Sex: 89 y.o., female  PCP: Lorenda Ishihara, MD Referring Physician: Joycelyn Das, MD  Reason for Consultation: Establishing goals of care  HPI/Patient Profile: 89 y.o. female  with past medical history of advanced dementia, CKD stage IIIb, asthma, COPD?, HFpEF, diabetes type 2, permanent pacemaker, and prior colectomy and colostomy.  Patient was last hospitalized in April 2024 with acute asthma exacerbation.  Though her respiratory symptoms improved, with her underlying advanced dementia and moderate to severe protein calorie malnutrition, she was deemed as failure to thrive.  PMT was consulted and after GOC discussions with the family regarding overall poor prognosis, patient was discharged home on hospice.  At some point she was admitted to [facility] but then discharged back home approximately 1 week ago with home hospice.   She presented to the ED on 10/26/2022 with abdominal pain and vomiting after eating.  CT scan showed dilated loops of small bowel, suggesting at least partial small bowel obstruction.  General surgery was consulted and has recommended conservative management.  NG tube was placed, but removed by patient x 2.  Palliative Medicine has been consulted for goals of care.   Subjective:   I have reviewed medical records including progress notes, labs and imaging, received an update from the RN, and assessed the patient at bedside. She is awake, pleasant, and oriented to self only.   I met with spoke with granddaughter [name] by phone to discuss diagnosis, prognosis, GOC, EOL wishes, disposition, and options.  Patient is known to PMT from previous hospitalizations.  I re-introduced Palliative Medicine as specialized medical care for people living with serious illness. It  focuses on providing relief from the symptoms and stress of a serious illness.   We discussed patient's current illness and what it means in the larger context of her ongoing co-morbidities. Current clinical status was reviewed.  Discussed that patient was admitted due to concern for partial small bowel obstruction.  Discussed that surgery recommended conservative management with NG tube placement for gastric decompression however patient pulled this out x 2.  Discussed that it was not medically necessary to place the NG tube again, as patient's and vomiting had since resolved.  We also discussed the natural trajectory dementia, and I emphasized that patient is in advanced stages.  Discussed her ongoing issues with failure to thrive.  Created space and opportunity for granddaughter to express thoughts and feelings regarding current medical situation. Values and goals of care were attempted to be elicited.  [name] reports that she has been the primary caregiver for her grandmother for 13 years. She shares this has become increasingly difficult, as her grandmother's overall health and functional status has declined.   [name] states that the goal is for her grandmother to live out the rest of her life as comfortable as possible.   Discussed current plan to discharge to [facility].  [name] understands that if patient is not imminently approaching EOL, then patient will return home with hospice at some point.   Questions and concerns addressed.  Emotional support provided.   Review of Systems  Unable to perform ROS   Objective:   Primary Diagnoses: Present on Admission:  Partial small  bowel obstruction [facility])   Physical Exam Vitals reviewed.  Constitutional:      General: She is awake. She is not in acute distress.    Comments: Frail, chronically ill-appearing  Pulmonary:     Effort: Pulmonary effort is normal.  Neurological:     Comments: Oriented to self    Vital Signs:  BP (!)  132/117   Pulse 65   Temp 98.7 F (37.1 C) (Oral)   Resp 12   SpO2 94%   Palliative Assessment/Data: PPS 20-30%     Assessment & Plan:   SUMMARY OF RECOMMENDATIONS   Family would like patient to discharge to [facility] Goal is comfort-focused care   Primary Decision Maker: NEXT OF KIN - granddaughter [name]  Code Status/Advance Care Planning: DNR - gold form on chart  Prognosis:  Difficult to determine, likely less than 3 months  Discharge Planning:  Hospice facility   Discussed with: Dr. Tyson Babinski, RN, TOC, and hospice liaison   Thank you for allowing [place] to participate in the care of [name]   Time Total: 75 minutes  Greater than 50%  of this time was spent counseling and coordinating care related to the above assessment and plan.   Signed by: Sherlean Foot, NP Palliative Medicine Team  Team Phone # [phone]  For individual providers, please see AMION

## 2022-10-27 NOTE — H&P (Signed)
History and Physical    PatientMarland Anderson [name] UUV:[id number] DOB: Jul 27, 1933 DOA: 10/26/2022 DOS: the patient was seen and examined on 10/27/2022 PCP: Lorenda Ishihara, MD  Patient coming from: Home-on hospice care.  Apparent recent discharge from [facility] back to home.  Chief Complaint:  Chief Complaint  Patient presents with   Abdominal Pain   HPI: [name] is a 89 y.o. female with medical history significant of CKD stage IIIb, COPD, HFpEF, diabetes mellitus, known permanent pacemaker, severe dementia.  Is also had a prior colectomy and colostomy.  Patient was last hospitalized in April 2024.  At that time she was admitted for acute asthma exacerbation.  Though symptoms improved but with her underlying severe dementia and moderate to severe protein calorie malnutrition she was deemed an adult failure to thrive.  Palliative care was consulted and after discussions with the family and overall poor prognosis and poor functional scores patient was made a DNR and patient was discharged on home hospice.  Apparently at some point patient was admitted to [facility] but then was discharged back to home 1 week ago with hospice care.  For this admission patient was brought to this facility by [facility] due to abdominal pain and vomiting after eating which has been ongoing for at least a few days.  Patient did vomit after EMS arrived.  Triage documents that there was some misunderstanding regarding the patient's morphine and Ativan prescription and these were not picked up after discharge.  Family does have transportation issues.  In the ED patient was afebrile, modestly hypertensive with a BP of 167/72.  She was not hypoxemic.  A CT scan did reveal dilated loops of initial NG tube was looped and was readjusted.  Subsequently patient pulled out NG tube out and a second tube was placed and the patient has also pulled out 1 out.  She is no longer actively vomiting and her abdomen is  not currently distended or painful on exam.  Labs reveal stable creatinine with elevated BUN and a nonanion gap acidemia with a CO2 of 21.  Lipase is also elevated at 115 likely from recurrent vomiting.  There is no leukocytosis although she does have a left shift with neutrophils 82%.  Urinalysis unremarkable.  General surgery has been consulted and will be following with the patient.  Hospitalist service has been asked to evaluate the patient for admission.   Review of Systems: As mentioned in the history of present illness. All other systems reviewed and are negative.  Merrilee obtained from the chart noting patient at this time is unable to participate with exam.   Past Medical History:  Diagnosis Date   Asthma    CKD (chronic kidney disease) stage 3, GFR 30-59 ml/min (HCC) 05/01/2018   Colostomy present (HCC)    hx/notes 07/17/2010   Dementia (HCC)    Diabetic neuropathy (HCC)    Hypertension    hx/notes 07/17/2010   Pneumonia    recent/notes 10/08/2016   Symptomatic bradycardia 12/15/2017   Type II diabetes mellitus (HCC)    hx/notes 07/17/2010   Past Surgical History:  Procedure Laterality Date   ABDOMINAL HYSTERECTOMY     COLOSTOMY     S/P SBO hx/notes 07/17/2010   LUMBAR FUSION  11/2006   hx/notes 07/17/2010   NASAL SINUS SURGERY  04/28/2002   Bilateral inferior turbinate reductions, bilateral maxillary antrotomies, bilateral total ethmoidectomies, bilateral frontal recess explorations, bilateral sphenoidotomies, Instatrac guidance./notes 07/28/2010   PACEMAKER IMPLANT N/A 05/02/2018  Procedure: PACEMAKER IMPLANT;  Surgeon: Marinus Maw, MD;  Location: [facility] INVASIVE CV LAB;  Service: Cardiovascular;  Laterality: N/A;   Social History:  reports that she has never smoked. She quit smokeless tobacco use about 5 years ago.  Her smokeless tobacco use included chew. She reports that she does not drink alcohol and does not use drugs.  Allergies  Allergen Reactions   Aspirin Other (See  Comments)    Triggers asthma    Family History  Problem Relation Age of Onset   Kidney disease Mother    Hypertension Mother    Other Father        gangrene with amputation   Diabetes Sister    Cancer Brother    Heart disease Brother    Schizophrenia Daughter    Diabetes Daughter    Stroke Daughter    Diabetes Sister    Breast cancer Neg Hx     Prior to Admission medications   Medication Sig Start Date End Date Taking? Authorizing Provider  acetaminophen (TYLENOL) 500 MG tablet Take 500 mg by mouth every 6 (six) hours as needed for moderate pain.    [provider]  ALBUTEROL SULFATE PO Take 2 puffs by mouth every 4 (four) hours as needed (Wheezing/SOB).    [provider]  azithromycin (ZITHROMAX) 500 MG tablet Take 1 tablet (500 mg total) by mouth daily. 07/09/22   Rhetta Mura, MD  busPIRone (BUSPAR) 5 MG tablet Take 1 tablet (5 mg total) by mouth 3 (three) times daily. 04/29/20   Medina-Vargas, Monina C, NP  donepezil (ARICEPT) 10 MG tablet Take 1 tablet (10 mg total) by mouth at bedtime. 09/05/20   Rhetta Mura, MD  memantine (NAMENDA XR) 28 MG CP24 24 hr capsule Take 1 capsule (28 mg total) by mouth every morning. 09/05/20   Rhetta Mura, MD  predniSONE (DELTASONE) 20 MG tablet Take 2 tablets (40 mg total) by mouth daily with breakfast. 07/10/22   Rhetta Mura, MD    Physical Exam: Vitals:   10/27/22 0430 10/27/22 0630 10/27/22 0700 10/27/22 0716  BP: (!) 192/80 (!) 154/81 (!) 132/117   Pulse: 61 60 65   Resp:   12   Temp:    98.7 F (37.1 C)  TempSrc:    Oral  SpO2: 99% 97% 94%    Constitutional: NAD, calm seems to be sedated, seems to be comfortable Respiratory: clear to auscultation bilaterally, no wheezing, no crackles. Normal respiratory effort. No accessory muscle use.  Room air Cardiovascular: Regular rate and rhythm, no murmurs / rubs / gallops. No extremity edema. 2+ pedal pulses. No carotid bruits.  Abdomen: no  tenderness outpatient, no masses palpated. No hepatosplenomegaly. Bowel sounds positive extremely hypoactive.  Patient has pulled out second NG tube.  Colostomy in place.  No stool or flatus noted. Musculoskeletal: no clubbing / cyanosis. No joint deformity upper and lower extremities. Good ROM, no contractures. Normal muscle tone.  Skin: no rashes, lesions, ulcers. No induration Neurologic: CN 2-12 grossly intact pupils are pinpoint consistent with recent administration of narcotic pain medicines. Sensation appears to be intact and patient awakens somewhat to tactile stimulation, patient unable to participate with strength testing Psychiatric: Appears to be sedated and is not able to participate fully with exam  Data Reviewed:  Sodium 136, potassium 4.2, chloride 104, CO2 21, glucose 147, BUN 44, creatinine 1.43, EFR 35, albumin 3.1, lipase 115, LFTs are normal  WBCs 8200 with elevated neutrophils 82%, hemoglobin 10.9, platelets 150,000  Urinalysis  unremarkable  CT abdomen as above  Assessment and Plan: Partial small bowel obstruction Currently patient not actively vomiting and abdomen is slightly distended and not tympanitic; hypoactive bowel sounds. Appreciate surgery team assistance.  They have documented that patient is not an appropriate candidate for surgical intervention. Given patient on Hospice, focus may transition to comfort measures especially since she is not a surgical candidate. I will leave NG tube out for now. N.p.o. and continue IV fluids.  Will increase to 125 cc/h for at least 24 hours and consider decreasing tomorrow Follow labs; try to keep potassium at least >/= 4.0 Surgery likely to initiate SBO protocol Compazine for nausea and microdose Dilaudid IV for pain  End-stage dementia Currently on hospice and family now requesting patient transition back to [facility] facility when stable for discharge PMT has been consulted to assist with discharge  planning Anticipate patient may transition to comfort measures this admission given presentation with partial small bowel obstruction and not a surgical candidate  CKD 3b Current creatinine at baseline Monitor I's/O while receiving IV fluids Avoid nephrotoxic medications  Diabetes mellitus Hemoglobin A1c in December 2023 was 6.4  HFpEF Echocardiogram December 2023 demonstrated mild diastolic dysfunction with an EF of 65 to 70%, no pulmonary hypertension or right side heart structural abnormalities.  No valvular abnormalities.  COPD Utilize as needed DuoNebs for wheezing     Advance Care Planning:   Code Status: DNR   DVT prophylaxis: Lovenox  Consults: Surgery  Family Communication: Attempted to call granddaughter [name]  Severity of Illness: The appropriate patient status for this patient is OBSERVATION. Observation status is judged to be reasonable and necessary in order to provide the required intensity of service to ensure the patient's safety. The patient's presenting symptoms, physical exam findings, and initial radiographic and laboratory data in the context of their medical condition is felt to place them at decreased risk for further clinical deterioration. Furthermore, it is anticipated that the patient will be medically stable for discharge from the hospital within 2 midnights of admission.   Author: Junious Silk, NP 10/27/2022 9:23 AM  For on call review [email or web address].

## 2022-10-27 NOTE — Progress Notes (Signed)
[facility] rm [phone] [facility] patient visit  Ms. 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 She was transported to ED via EMS after complaints of abdominal pain and nausea. [facility] was not notified prior to transport. Per Dr. Patric Dykes with [facility] this is a related hospital admission.   Visited at bedside, patient is alert and pleasantly confused and in no apparent distress. Attempted to reach her granddaughter several times without success. She is inpatient appropriate at this time due to need for IV fluids and testing related to abdominal pain. She initially had NG tube but she pulled this out and they were unable to reinsert.  Vital Signs- 98/59/18   115/60    100% room air I&O- 0/480 Abnormal Labs- Bun 44, Creatnine 1.43, Albumin 3.2, GFR 35, Hgb 10.9, Hct 35,  Diagnostics-  CT ABDOMEN AND PELVIS WITH CONTRAST IMPRESSION: Dilated loops of small bowel, suggesting at least partial small bowel obstruction, with transition in the lower pelvis.  Cholelithiasis, without associated inflammatory changes. Small right pleural effusion. Small volume pelvic ascites.  Electronically Signed   By: Charline Bills M.D.   On: 10/27/2022 02:02 IV/PRN Meds- NS @ 125cc/H, Dilaudid 2mg  IV x1, Zofran 4mg  IVx1 Problem List- Partial small bowel obstruction Currently patient not actively vomiting and abdomen is slightly distended and not tympanitic; hypoactive bowel sounds. Appreciate surgery team assistance.  They have documented that patient is not an appropriate candidate for surgical intervention. Given patient on Hospice, focus may transition to comfort measures especially since she is not a surgical candidate. I will leave NG tube out for now. N.p.o. and continue IV fluids.  Will increase to 125 cc/h for at least 24 hours and consider decreasing  tomorrow Follow labs; try to keep potassium at least >/= 4.0 Surgery likely to initiate SBO protocol Compazine for nausea and microdose Dilaudid IV for pain   End-stage dementia Currently on hospice and family now requesting patient transition back to [facility] facility when stable for discharge PMT has been consulted to assist with discharge planning Anticipate patient may transition to comfort measures this admission given presentation with partial small bowel obstruction and not a surgical candidate    Discharge Planning- Ongoing Family Contact- Called granddaughter several times without success  IDT: updated Goals of Care: DNR  Thank you for the opportunity to participate in this patient's care, please don't hesitate to call for any hospice related questions or concerns.  Med list and transfer summary provided to bedside RN Thea Gist BSN, [facility] Liaison [phone]

## 2022-10-27 NOTE — Plan of Care (Signed)
Patient alert/oriented X2. Patient does not recall the time or situation. Patient VSS upon admission and colostomy remains intact. Patient has clay colored stools in colostomy. Patient complains of no pain or nausea. Will continue to monitor.

## 2022-10-28 ENCOUNTER — Observation Stay (HOSPITAL_COMMUNITY)

## 2022-10-28 DIAGNOSIS — J4489 Other specified chronic obstructive pulmonary disease: Secondary | ICD-10-CM | POA: Diagnosis present

## 2022-10-28 DIAGNOSIS — E872 Acidosis, unspecified: Secondary | ICD-10-CM | POA: Diagnosis present

## 2022-10-28 DIAGNOSIS — I130 Hypertensive heart and chronic kidney disease with heart failure and stage 1 through stage 4 chronic kidney disease, or unspecified chronic kidney disease: Secondary | ICD-10-CM | POA: Diagnosis present

## 2022-10-28 DIAGNOSIS — Z7189 Other specified counseling: Secondary | ICD-10-CM

## 2022-10-28 DIAGNOSIS — E1140 Type 2 diabetes mellitus with diabetic neuropathy, unspecified: Secondary | ICD-10-CM | POA: Diagnosis present

## 2022-10-28 DIAGNOSIS — I5032 Chronic diastolic (congestive) heart failure: Secondary | ICD-10-CM | POA: Diagnosis present

## 2022-10-28 DIAGNOSIS — Z981 Arthrodesis status: Secondary | ICD-10-CM | POA: Diagnosis not present

## 2022-10-28 DIAGNOSIS — Z66 Do not resuscitate: Secondary | ICD-10-CM | POA: Diagnosis present

## 2022-10-28 DIAGNOSIS — E440 Moderate protein-calorie malnutrition: Secondary | ICD-10-CM | POA: Diagnosis present

## 2022-10-28 DIAGNOSIS — Z833 Family history of diabetes mellitus: Secondary | ICD-10-CM | POA: Diagnosis not present

## 2022-10-28 DIAGNOSIS — K56600 Partial intestinal obstruction, unspecified as to cause: Principal | ICD-10-CM

## 2022-10-28 DIAGNOSIS — Z5982 Transportation insecurity: Secondary | ICD-10-CM | POA: Diagnosis not present

## 2022-10-28 DIAGNOSIS — N1832 Chronic kidney disease, stage 3b: Secondary | ICD-10-CM | POA: Diagnosis present

## 2022-10-28 DIAGNOSIS — K56609 Unspecified intestinal obstruction, unspecified as to partial versus complete obstruction: Secondary | ICD-10-CM | POA: Diagnosis present

## 2022-10-28 DIAGNOSIS — R627 Adult failure to thrive: Secondary | ICD-10-CM | POA: Diagnosis present

## 2022-10-28 DIAGNOSIS — E1122 Type 2 diabetes mellitus with diabetic chronic kidney disease: Secondary | ICD-10-CM | POA: Diagnosis present

## 2022-10-28 DIAGNOSIS — Z818 Family history of other mental and behavioral disorders: Secondary | ICD-10-CM | POA: Diagnosis not present

## 2022-10-28 DIAGNOSIS — Z841 Family history of disorders of kidney and ureter: Secondary | ICD-10-CM | POA: Diagnosis not present

## 2022-10-28 DIAGNOSIS — F03C0 Unspecified dementia, severe, without behavioral disturbance, psychotic disturbance, mood disturbance, and anxiety: Secondary | ICD-10-CM

## 2022-10-28 DIAGNOSIS — Z515 Encounter for palliative care: Secondary | ICD-10-CM | POA: Diagnosis not present

## 2022-10-28 DIAGNOSIS — J441 Chronic obstructive pulmonary disease with (acute) exacerbation: Secondary | ICD-10-CM | POA: Diagnosis present

## 2022-10-28 DIAGNOSIS — Z0389 Encounter for observation for other suspected diseases and conditions ruled out: Secondary | ICD-10-CM | POA: Diagnosis not present

## 2022-10-28 DIAGNOSIS — Z933 Colostomy status: Secondary | ICD-10-CM | POA: Diagnosis not present

## 2022-10-28 DIAGNOSIS — Z886 Allergy status to analgesic agent status: Secondary | ICD-10-CM | POA: Diagnosis not present

## 2022-10-28 DIAGNOSIS — Z8249 Family history of ischemic heart disease and other diseases of the circulatory system: Secondary | ICD-10-CM | POA: Diagnosis not present

## 2022-10-28 DIAGNOSIS — R944 Abnormal results of kidney function studies: Secondary | ICD-10-CM | POA: Diagnosis present

## 2022-10-28 DIAGNOSIS — Z87891 Personal history of nicotine dependence: Secondary | ICD-10-CM | POA: Diagnosis not present

## 2022-10-28 DIAGNOSIS — K5669 Other partial intestinal obstruction: Secondary | ICD-10-CM | POA: Diagnosis not present

## 2022-10-28 NOTE — Progress Notes (Signed)
       Thank you for this consult. Chart reviewed. Assessed patient at bedside; she is currently somnolent and unable to answer questions.  No family at bedside.  Called granddaughter/Tamica x 2 in efforts to discuss goals of care; she did not answer and I was unable to leave a voicemail due to the mailbox being full.     Sherlean Foot, NP-C Palliative Medicine   Please call Palliative Medicine team phone with any questions [phone]. For individual providers please see AMION.   No charge

## 2022-10-28 NOTE — Discharge Summary (Addendum)
Physician Discharge Summary  [name]:[id number] DOB: 02/11/1934 DOA: 10/26/2022  PCP: Lorenda Ishihara, MD  Admit date: 10/26/2022 Discharge date: 10/28/2022  Admitted From: Home  Discharge disposition: hospice facility  Recommendations for Outpatient Follow-Up:   Follow up with hospice care provider at hospice facility.   Discharge Diagnosis:   Principal Problem:   Partial small bowel obstruction (HCC) Active Problems:   Partial bowel obstruction (HCC)   Discharge Condition: Improved.  Diet recommendation: Clear liquids.  Advance as tolerated. Wound care: None.  Code status: DNR   History of Present Illness:   [name] is a 89 y.o. female with medical history significant of CKD stage IIIb, COPD, HFpEF, diabetes mellitus, known permanent pacemaker, severe dementia, history of colectomy and colostomy who was last hospitalized April 2024 for asthma exacerbation.  Patient does have underlying severe dementia with failure to thrive and is currently under hospice care..  Patient was at [facility] but then was discharged back to home 1 week ago with hospice care.  Patient presented at this time with abdominal pain nausea vomiting for at least few days. In the ED, patient was afebrile, mildly hypertensive with 167/72.  CT scan did reveal dilated loops.  NG tube was attempted but was subsequently removed by the patient.  Labs showed stable creatinine, no leukocytosis. Urinalysis unremarkable.  General surgery was consulted and patient was admitted hospital for further evaluation and treatment.    Hospital Course:   Following conditions were addressed during hospitalization as listed below,  Partial small bowel obstruction Feels better today no nausea vomiting.  Has had some output in the colostomy.  General surgery has seen the patient and has been started on clears today.  Patient is on hospice.  Plan is to transition to residential hospice at this time.   Patient received IV fluids during hospitalization.    Failure to thrive, moderate malnutrition. Has history of weight loss, has muscle mass loss with underlying dementia.    End-stage dementia PT on board.  Currently on hospice.  TOC on board including hospice team.  Plan is residential hospice and discharge   CKD 3b Current creatinine at baseline, creatinine of 1.4 today.   Diabetes mellitus type II. Hemoglobin A1c in December 2023 was 6.4.  Will be transition to comfort care and hospice at discharge   HFpEF 2D echocardiogram December 2023 demonstrated mild diastolic dysfunction with an EF of 65 to 70%, no pulmonary hypertension or right side heart structural abnormalities.  No valvular abnormalities.     COPD As needed DuoNebs.  Disposition.  At this time, patient is stable for disposition to residential hospice.  Medical Consultants:   General Surgery   Procedures:    NG tube present and removal Subjective:   Today, patient was seen and examined at bedside.  Patient states no nausea vomiting or abdominal pain.  Had some output in the colostomy bag   Discharge Exam:   Vitals:   10/27/22 2000 10/28/22 0752  BP: (!) 96/54 (!) 103/54  Pulse: 66 (!) 59  Resp: 18 18  Temp: 98.6 F (37 C) (!) 97.4 F (36.3 C)  SpO2: 100% 100%   Vitals:   10/27/22 1023 10/27/22 1630 10/27/22 2000 10/28/22 0752  BP: (!) 91/43 115/60 (!) 96/54 (!) 103/54  Pulse: 64 (!) 59 66 (!) 59  Resp: 18 18 18 18   Temp: 97.7 F (36.5 C) 98 F (36.7 C) 98.6 F (37 C) (!) 97.4 F (36.3 C)  TempSrc:  Oral Oral  SpO2: 100% 100% 100% 100%   General:  Average built, not in obvious distress, elderly female, Communicative, HENT:   No scleral pallor or icterus noted. Oral mucosa is moist.  Chest:  Clear breath sounds. No crackles or wheezes.  CVS: S1 &S2 heard. No murmur.  Regular rate and rhythm. Abdomen: Soft, nontender, nondistended.  Bowel sounds are heard.  Lower quadrant colostomy with  output. Extremities: No cyanosis, clubbing or edema.  Peripheral pulses are palpable. Psych: Alert, awake and Communicative, answering questions, CNS:  No cranial nerve deficits.  Power equal in all extremities.   Skin: Warm and dry.  No rashes noted.   The results of significant diagnostics from this hospitalization (including imaging, microbiology, ancillary and laboratory) are listed below for reference.     Diagnostic Studies:   DG Abdomen 1 View  Result Date: 10/27/2022 CLINICAL DATA:  Nasogastric tube placement EXAM: ABDOMEN - 1 VIEW COMPARISON:  Abdominal CT from earlier today FINDINGS: Enteric tube loops through the esophagus with side port seen over the mid esophagus and tip not covered. There is a dilated loop of bowel in the central abdomen, known from prior CT. Stable lung bases with dual-chamber pacer leads. Prelim sent to ER staff and RN via epic chat. IMPRESSION: Malpositioned enteric tube which loops in the esophagus. Electronically Signed   By: Tiburcio Pea M.D.   On: 10/27/2022 06:12   CT ABDOMEN PELVIS W CONTRAST  Result Date: 10/27/2022 CLINICAL DATA:  Acute abdominal pain EXAM: CT ABDOMEN AND PELVIS WITH CONTRAST TECHNIQUE: Multidetector CT imaging of the abdomen and pelvis was performed using the standard protocol following bolus administration of intravenous contrast. RADIATION DOSE REDUCTION: This exam was performed according to the departmental dose-optimization program which includes automated exposure control, adjustment of the mA and/or kV according to patient size and/or use of iterative reconstruction technique. CONTRAST:  60mL OMNIPAQUE IOHEXOL 350 MG/ML SOLN COMPARISON:  12/10/2020 FINDINGS: Lower chest: Small right pleural effusion. Mild bibasilar atelectasis. Hepatobiliary: Liver is within normal limits. Layering small gallstones (series 3/image 23), without associated inflammatory changes. No intrahepatic or extrahepatic duct dilatation. Pancreas: Mildly  prominent pancreatic duct, measuring up to 6 mm in the pancreatic head, although extending to the ampulla without obstructing lesion. This appearance is similar to the prior when accounting for differences in technique. No parenchymal mass or atrophy is seen. Spleen: Within normal limits. Adrenals/Urinary Tract: Adrenal glands are within normal limits. 7 mm right upper pole simple cyst (series 8/image 10), benign (Bosniak I). No follow-up is recommended. 5 mm parenchymal calcification along the left upper kidney (series 3/image 17). No hydronephrosis. Bladder is within normal limits. Stomach/Bowel: Stomach is within normal limits. Multiple dilated loops of small bowel in the central abdomen, with transition in the lower pelvis (coronal image 22), suggesting at least partial small bowel obstruction. Appendix is not discretely visualized. Status post left hemicolectomy with left mid abdominal colostomy and Hartman's pouch. Vascular/Lymphatic: No evidence of abdominal aortic aneurysm. Atherosclerotic calcifications of the abdominal aorta and branch vessels, although vessels remain patent. No suspicious abdominopelvic lymphadenopathy. Reproductive: Status post hysterectomy. No adnexal masses. Other: Small volume pelvic ascites. Musculoskeletal: Status post PLIF at L3-5. Degenerative changes of the visualized thoracolumbar spine. IMPRESSION: Dilated loops of small bowel, suggesting at least partial small bowel obstruction, with transition in the lower pelvis. Cholelithiasis, without associated inflammatory changes. Small right pleural effusion. Small volume pelvic ascites. Electronically Signed   By: Charline Bills M.D.   On: 10/27/2022 02:02  Labs:   Basic Metabolic Panel: Recent Labs  Lab 10/27/22 0102  NA 136  K 4.2  CL 104  CO2 21*  GLUCOSE 147*  BUN 44*  CREATININE 1.43*  CALCIUM 8.9   GFR CrCl cannot be calculated (Unknown ideal weight.). Liver Function Tests: Recent Labs  Lab  10/27/22 0102  AST 19  ALT 12  ALKPHOS 56  BILITOT 0.4  PROT 6.7  ALBUMIN 3.2*   Recent Labs  Lab 10/27/22 0102  LIPASE 115*   No results for input(s): "AMMONIA" in the last 168 hours. Coagulation profile No results for input(s): "INR", "PROTIME" in the last 168 hours.  CBC: Recent Labs  Lab 10/27/22 0102  WBC 8.2  NEUTROABS 6.8  HGB 10.9*  HCT 35.0*  MCV 96.2  PLT 150   Cardiac Enzymes: No results for input(s): "CKTOTAL", "CKMB", "CKMBINDEX", "TROPONINI" in the last 168 hours. BNP: Invalid input(s): "POCBNP" CBG: No results for input(s): "GLUCAP" in the last 168 hours. D-Dimer No results for input(s): "DDIMER" in the last 72 hours. Hgb A1c No results for input(s): "HGBA1C" in the last 72 hours. Lipid Profile No results for input(s): "CHOL", "HDL", "LDLCALC", "TRIG", "CHOLHDL", "LDLDIRECT" in the last 72 hours. Thyroid function studies No results for input(s): "TSH", "T4TOTAL", "T3FREE", "THYROIDAB" in the last 72 hours.  Invalid input(s): "FREET3" Anemia work up No results for input(s): "VITAMINB12", "FOLATE", "FERRITIN", "TIBC", "IRON", "RETICCTPCT" in the last 72 hours. Microbiology No results found for this or any previous visit (from the past 240 hour(s)).   Discharge Instructions:   Discharge Instructions     Diet clear liquid   Complete by: As directed    Discharge instructions   Complete by: As directed    Further plan as per hospice care provider. Advance diet as tolerated   Increase activity slowly   Complete by: As directed       Allergies as of 10/28/2022       Reactions   Aspirin Other (See Comments)   Triggers asthma        Medication List     STOP taking these medications    azithromycin 500 MG tablet Commonly known as: ZITHROMAX   predniSONE 20 MG tablet Commonly known as: DELTASONE       TAKE these medications    acetaminophen 500 MG tablet Commonly known as: TYLENOL Take 500 mg by mouth every 6 (six) hours as  needed for moderate pain.   ALBUTEROL SULFATE PO Take 2 puffs by mouth every 4 (four) hours as needed (Wheezing/SOB).   busPIRone 5 MG tablet Commonly known as: BUSPAR Take 1 tablet (5 mg total) by mouth 3 (three) times daily.   donepezil 10 MG tablet Commonly known as: ARICEPT Take 1 tablet (10 mg total) by mouth at bedtime.   memantine 28 MG Cp24 24 hr capsule Commonly known as: NAMENDA XR Take 1 capsule (28 mg total) by mouth every morning.          Time coordinating discharge: 39 minutes  Signed:  Deniah Saia  [facility] 10/28/2022, 2:43 PM

## 2022-10-28 NOTE — Progress Notes (Signed)
Subjective/Chief Complaint: No acute events.  Patient denies abdominal pain, nausea or vomiting.  Has not had unction that she can recall.   Objective: Vital signs in last 24 hours: Temp:  [97.4 F (36.3 C)-98.6 F (37 C)] 97.4 F (36.3 C) (08/14 0752) Pulse Rate:  [59-66] 59 (08/14 0752) Resp:  [18] 18 (08/14 0752) BP: (91-115)/(43-60) 103/54 (08/14 0752) SpO2:  [98 %-100 %] 100 % (08/14 0752) Last BM Date : 10/27/22  Intake/Output from previous day: 08/13 0701 - 08/14 0700 In: 228.3 [I.V.:228.3] Out: 480 [Emesis/NG output:480] Intake/Output this shift: No intake/output data recorded.  Alert, oriented to self, no distress Abdomen respirations Abdomen soft, nondistended, no tenderness  Lab Results:  Recent Labs    10/27/22 0102  WBC 8.2  HGB 10.9*  HCT 35.0*  PLT 150   BMET Recent Labs    10/27/22 0102  NA 136  K 4.2  CL 104  CO2 21*  GLUCOSE 147*  BUN 44*  CREATININE 1.43*  CALCIUM 8.9   PT/INR No results for input(s): "LABPROT", "INR" in the last 72 hours. ABG No results for input(s): "PHART", "HCO3" in the last 72 hours.  Invalid input(s): "PCO2", "PO2"  Studies/Results: DG Abd Portable 1V  Result Date: 10/28/2022 CLINICAL DATA:  Evaluate for small bowel obstruction. EXAM: PORTABLE ABDOMEN - 1 VIEW COMPARISON:  10/27/2022 FINDINGS: Interval improvement in previous dilated central small bowel loops. Gas mixed with stool noted within nondilated colon. Contrast from previous CT noted within the bladder. Postop change seen within the lumbar spine. IMPRESSION: Interval improvement in previous dilated central small bowel loops. Electronically Signed   By: Signa Kell M.D.   On: 10/28/2022 08:02   DG Abdomen 1 View  Result Date: 10/27/2022 CLINICAL DATA:  Nasogastric tube placement EXAM: ABDOMEN - 1 VIEW COMPARISON:  Abdominal CT from earlier today FINDINGS: Enteric tube loops through the esophagus with side port seen over the mid esophagus and tip  not covered. There is a dilated loop of bowel in the central abdomen, known from prior CT. Stable lung bases with dual-chamber pacer leads. Prelim sent to ER staff and RN via epic chat. IMPRESSION: Malpositioned enteric tube which loops in the esophagus. Electronically Signed   By: Tiburcio Pea M.D.   On: 10/27/2022 06:12   CT ABDOMEN PELVIS W CONTRAST  Result Date: 10/27/2022 CLINICAL DATA:  Acute abdominal pain EXAM: CT ABDOMEN AND PELVIS WITH CONTRAST TECHNIQUE: Multidetector CT imaging of the abdomen and pelvis was performed using the standard protocol following bolus administration of intravenous contrast. RADIATION DOSE REDUCTION: This exam was performed according to the departmental dose-optimization program which includes automated exposure control, adjustment of the mA and/or kV according to patient size and/or use of iterative reconstruction technique. CONTRAST:  60mL OMNIPAQUE IOHEXOL 350 MG/ML SOLN COMPARISON:  12/10/2020 FINDINGS: Lower chest: Small right pleural effusion. Mild bibasilar atelectasis. Hepatobiliary: Liver is within normal limits. Layering small gallstones (series 3/image 23), without associated inflammatory changes. No intrahepatic or extrahepatic duct dilatation. Pancreas: Mildly prominent pancreatic duct, measuring up to 6 mm in the pancreatic head, although extending to the ampulla without obstructing lesion. This appearance is similar to the prior when accounting for differences in technique. No parenchymal mass or atrophy is seen. Spleen: Within normal limits. Adrenals/Urinary Tract: Adrenal glands are within normal limits. 7 mm right upper pole simple cyst (series 8/image 10), benign (Bosniak I). No follow-up is recommended. 5 mm parenchymal calcification along the left upper kidney (series 3/image 17). No hydronephrosis. Bladder  is within normal limits. Stomach/Bowel: Stomach is within normal limits. Multiple dilated loops of small bowel in the central abdomen, with  transition in the lower pelvis (coronal image 22), suggesting at least partial small bowel obstruction. Appendix is not discretely visualized. Status post left hemicolectomy with left mid abdominal colostomy and Hartman's pouch. Vascular/Lymphatic: No evidence of abdominal aortic aneurysm. Atherosclerotic calcifications of the abdominal aorta and branch vessels, although vessels remain patent. No suspicious abdominopelvic lymphadenopathy. Reproductive: Status post hysterectomy. No adnexal masses. Other: Small volume pelvic ascites. Musculoskeletal: Status post PLIF at L3-5. Degenerative changes of the visualized thoracolumbar spine. IMPRESSION: Dilated loops of small bowel, suggesting at least partial small bowel obstruction, with transition in the lower pelvis. Cholelithiasis, without associated inflammatory changes. Small right pleural effusion. Small volume pelvic ascites. Electronically Signed   By: Charline Bills M.D.   On: 10/27/2022 02:02    Anti-infectives: Anti-infectives (From admission, onward)    None       Assessment/Plan:  Partial SBO - This is an elderly patient with multiple medical problems on hospice that comes into today with a partial SBO. She is not a great historian.  -X-ray improved this morning, abdominal exam benign.  Okay to try sips of clears today.  ID - none indicated VTE - SCDs, ok for chemical dvt ppx from surgical standpoint FEN - IVF Foley - none   Dementia Symptomatic bradycardia s/p PPM HFpEF Diabetes CKD stage 3b Asthma DNR, on home hospice   I reviewed ED provider notes, last 24 h vitals and pain scores, last 48 h intake and output, last 24 h labs and trends, and last 24 h imaging results.   LOS: 0 days    Berna Bue 10/28/2022

## 2022-10-28 NOTE — Progress Notes (Signed)
[name] to be D/C'd to residential hospice per MD order.  Discussed with the patient and all questions fully answered.  VSS, Skin clean, dry and intact without evidence of skin break down, no evidence of skin tears noted. IV catheter discontinued intact. Site without signs and symptoms of complications. Dressing and pressure applied.  An After Visit Summary was printed and placed in chart for receiving facility.  Report called to Forde Radon at [facility].  Patient instructed to return to ED, call 911, or call MD for any changes in condition.   Patient escorted via stretcher, and D/C to hospice facility via non emergency healthcare transportation.  [name] 10/28/2022 3:53 PM

## 2022-10-28 NOTE — Progress Notes (Signed)
PROGRESS NOTE    [name]  GNF:[id number] DOB: [birth year] DOA: 10/26/2022 PCP: Lorenda Ishihara, MD    Brief Narrative:    [name] is a 89 y.o. female with medical history significant of CKD stage IIIb, COPD, HFpEF, diabetes mellitus, known permanent pacemaker, severe dementia, history of colectomy and colostomy who was last hospitalized April 2024 for asthma exacerbation.  Patient does have underlying severe dementia with failure to thrive and is currently under hospice care..  Patient was at [facility] but then was discharged back to home 1 week ago with hospice care.  Patient presented at this time with abdominal pain nausea vomiting for at least few days. In the ED, patient was afebrile, mildly hypertensive with 167/72.  CT scan did reveal dilated loops.  NG tube was attempted but was subsequently removed by the patient.  Labs showed stable creatinine, no leukocytosis. Urinalysis unremarkable.  General surgery was consulted and patient was admitted hospital for further evaluation and treatment.   Assessment and Plan:  Partial small bowel obstruction Feels better today no nausea vomiting.  Has had some output in the colostomy.  General surgery has seen the patient and has been started on clears today.  Patient is on hospice.  Continue IV fluids hydration and electrolyte replacement.  End-stage dementia PT on board.  Currently on hospice.  TOC on board including hospice team.   CKD 3b Current creatinine at baseline, creatinine of 1.4 today. Continue intake and output charting, daily weights.   Diabetes mellitus type II. Hemoglobin A1c in December 2023 was 6.4 we will continue to monitor closely.  Started on clears.   HFpEF 2D echocardiogram December 2023 demonstrated mild diastolic dysfunction with an EF of 65 to 70%, no pulmonary hypertension or right side heart structural abnormalities.  No valvular abnormalities.  Currently on IV fluids.  Will closely monitor.   Since patient will be started on clears will decrease the rate of IV fluids.   COPD As needed DuoNebs.    DVT prophylaxis: enoxaparin (LOVENOX) injection 40 mg Start: 10/27/22 1000   Code Status:     Code Status: DNR  Disposition: Hospice on discharge likely in 1 to 2 days, follow surgical recommendations  Status is: Observation The patient will require care spanning > 2 midnights and should be moved to inpatient because: Bowel obstruction, on clears, or suspicion   Family Communication: None at bedside  Consultants:  General Surgery  Procedures:  NG tube  Antimicrobials:  None  Anti-infectives (From admission, onward)    None       Subjective: Today, patient was seen and examined at bedside.  Patient states no nausea vomiting or abdominal pain.  Had some output in the colostomy bag  Objective: Vitals:   10/27/22 1023 10/27/22 1630 10/27/22 2000 10/28/22 0752  BP: (!) 91/43 115/60 (!) 96/54 (!) 103/54  Pulse: 64 (!) 59 66 (!) 59  Resp: 18 18 18 18   Temp: 97.7 F (36.5 C) 98 F (36.7 C) 98.6 F (37 C) (!) 97.4 F (36.3 C)  TempSrc:   Oral Oral  SpO2: 100% 100% 100% 100%    Intake/Output Summary (Last 24 hours) at 10/28/2022 1139 Last data filed at 10/28/2022 0204 Gross per 24 hour  Intake 228.28 ml  Output --  Net 228.28 ml   There were no vitals filed for this visit.  Physical Examination: There is no height or weight on file to calculate BMI.   General:  Average built, not in obvious  distress, elderly female, Communicative, HENT:   No scleral pallor or icterus noted. Oral mucosa is moist.  Chest:  Clear breath sounds. No crackles or wheezes.  CVS: S1 &S2 heard. No murmur.  Regular rate and rhythm. Abdomen: Soft, nontender, nondistended.  Bowel sounds are heard.  Lower quadrant colostomy with output. Extremities: No cyanosis, clubbing or edema.  Peripheral pulses are palpable. Psych: Alert, awake and Communicative, answering questions, CNS:  No  cranial nerve deficits.  Power equal in all extremities.   Skin: Warm and dry.  No rashes noted.  Data Reviewed:   CBC: Recent Labs  Lab 10/27/22 0102  WBC 8.2  NEUTROABS 6.8  HGB 10.9*  HCT 35.0*  MCV 96.2  PLT 150    Basic Metabolic Panel: Recent Labs  Lab 10/27/22 0102  NA 136  K 4.2  CL 104  CO2 21*  GLUCOSE 147*  BUN 44*  CREATININE 1.43*  CALCIUM 8.9    Liver Function Tests: Recent Labs  Lab 10/27/22 0102  AST 19  ALT 12  ALKPHOS 56  BILITOT 0.4  PROT 6.7  ALBUMIN 3.2*     Radiology Studies: DG Abd Portable 1V  Result Date: 10/28/2022 CLINICAL DATA:  Evaluate for small bowel obstruction. EXAM: PORTABLE ABDOMEN - 1 VIEW COMPARISON:  10/27/2022 FINDINGS: Interval improvement in previous dilated central small bowel loops. Gas mixed with stool noted within nondilated colon. Contrast from previous CT noted within the bladder. Postop change seen within the lumbar spine. IMPRESSION: Interval improvement in previous dilated central small bowel loops. Electronically Signed   By: Signa Kell M.D.   On: 10/28/2022 08:02   DG Abdomen 1 View  Result Date: 10/27/2022 CLINICAL DATA:  Nasogastric tube placement EXAM: ABDOMEN - 1 VIEW COMPARISON:  Abdominal CT from earlier today FINDINGS: Enteric tube loops through the esophagus with side port seen over the mid esophagus and tip not covered. There is a dilated loop of bowel in the central abdomen, known from prior CT. Stable lung bases with dual-chamber pacer leads. Prelim sent to ER staff and RN via epic chat. IMPRESSION: Malpositioned enteric tube which loops in the esophagus. Electronically Signed   By: Tiburcio Pea M.D.   On: 10/27/2022 06:12   CT ABDOMEN PELVIS W CONTRAST  Result Date: 10/27/2022 CLINICAL DATA:  Acute abdominal pain EXAM: CT ABDOMEN AND PELVIS WITH CONTRAST TECHNIQUE: Multidetector CT imaging of the abdomen and pelvis was performed using the standard protocol following bolus administration of  intravenous contrast. RADIATION DOSE REDUCTION: This exam was performed according to the departmental dose-optimization program which includes automated exposure control, adjustment of the mA and/or kV according to patient size and/or use of iterative reconstruction technique. CONTRAST:  60mL OMNIPAQUE IOHEXOL 350 MG/ML SOLN COMPARISON:  12/10/2020 FINDINGS: Lower chest: Small right pleural effusion. Mild bibasilar atelectasis. Hepatobiliary: Liver is within normal limits. Layering small gallstones (series 3/image 23), without associated inflammatory changes. No intrahepatic or extrahepatic duct dilatation. Pancreas: Mildly prominent pancreatic duct, measuring up to 6 mm in the pancreatic head, although extending to the ampulla without obstructing lesion. This appearance is similar to the prior when accounting for differences in technique. No parenchymal mass or atrophy is seen. Spleen: Within normal limits. Adrenals/Urinary Tract: Adrenal glands are within normal limits. 7 mm right upper pole simple cyst (series 8/image 10), benign (Bosniak I). No follow-up is recommended. 5 mm parenchymal calcification along the left upper kidney (series 3/image 17). No hydronephrosis. Bladder is within normal limits. Stomach/Bowel: Stomach is within normal limits.  Multiple dilated loops of small bowel in the central abdomen, with transition in the lower pelvis (coronal image 22), suggesting at least partial small bowel obstruction. Appendix is not discretely visualized. Status post left hemicolectomy with left mid abdominal colostomy and Hartman's pouch. Vascular/Lymphatic: No evidence of abdominal aortic aneurysm. Atherosclerotic calcifications of the abdominal aorta and branch vessels, although vessels remain patent. No suspicious abdominopelvic lymphadenopathy. Reproductive: Status post hysterectomy. No adnexal masses. Other: Small volume pelvic ascites. Musculoskeletal: Status post PLIF at L3-5. Degenerative changes of the  visualized thoracolumbar spine. IMPRESSION: Dilated loops of small bowel, suggesting at least partial small bowel obstruction, with transition in the lower pelvis. Cholelithiasis, without associated inflammatory changes. Small right pleural effusion. Small volume pelvic ascites. Electronically Signed   By: Charline Bills M.D.   On: 10/27/2022 02:02      LOS: 0 days   Joycelyn Das, MD [facility] Available via Epic secure chat 7am-7pm After these hours, please refer to coverage provider listed on [email or web address] 10/28/2022, 11:39 AM

## 2022-12-30 DIAGNOSIS — I469 Cardiac arrest, cause unspecified: Secondary | ICD-10-CM | POA: Diagnosis not present

## 2023-01-15 DEATH — deceased
